# Patient Record
Sex: Female | Born: 2000 | Race: Black or African American | Hispanic: No | Marital: Single | Smoking: Former smoker
Health system: Southern US, Community
[De-identification: ages and names within clinical notes are randomized; demographics above are authoritative.]

## PROBLEM LIST (undated history)

## (undated) ENCOUNTER — Inpatient Hospital Stay (HOSPITAL_COMMUNITY): Payer: Self-pay

## (undated) DIAGNOSIS — N760 Acute vaginitis: Secondary | ICD-10-CM

## (undated) DIAGNOSIS — M329 Systemic lupus erythematosus, unspecified: Secondary | ICD-10-CM

## (undated) DIAGNOSIS — O361990 Maternal care for other isoimmunization, unspecified trimester, not applicable or unspecified: Secondary | ICD-10-CM

## (undated) DIAGNOSIS — I639 Cerebral infarction, unspecified: Secondary | ICD-10-CM

## (undated) DIAGNOSIS — B9689 Other specified bacterial agents as the cause of diseases classified elsewhere: Secondary | ICD-10-CM

## (undated) DIAGNOSIS — O26893 Other specified pregnancy related conditions, third trimester: Principal | ICD-10-CM

## (undated) DIAGNOSIS — R51 Headache: Principal | ICD-10-CM

## (undated) DIAGNOSIS — B373 Candidiasis of vulva and vagina: Secondary | ICD-10-CM

---
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
## 2018-07-03 ENCOUNTER — Other Ambulatory Visit: Payer: Self-pay

## 2018-07-03 ENCOUNTER — Encounter (HOSPITAL_COMMUNITY): Payer: Self-pay

## 2018-07-03 ENCOUNTER — Ambulatory Visit (HOSPITAL_COMMUNITY)
Admission: EM | Admit: 2018-07-03 | Discharge: 2018-07-03 | Disposition: A | Discharge status: Home / Self Care | Payer: Medicaid Other | Attending: Family Medicine | Admitting: Family Medicine

## 2018-07-03 DIAGNOSIS — J029 Acute pharyngitis, unspecified: Secondary | ICD-10-CM | POA: Insufficient documentation

## 2018-07-03 DIAGNOSIS — R509 Fever, unspecified: Secondary | ICD-10-CM

## 2018-07-03 DIAGNOSIS — H9201 Otalgia, right ear: Secondary | ICD-10-CM

## 2018-07-03 LAB — POCT RAPID STREP A: Streptococcus, Group A Screen (Direct): NEGATIVE

## 2018-07-03 MED ORDER — DEXAMETHASONE SODIUM PHOSPHATE 10 MG/ML IJ SOLN
INTRAMUSCULAR | Status: AC
  Filled 2018-07-03: qty 1

## 2018-07-03 MED ORDER — DEXAMETHASONE SODIUM PHOSPHATE 10 MG/ML IJ SOLN
10.0000 mg | Freq: Once | INTRAMUSCULAR | Status: AC
  Administered 2018-07-03: 12:00:00 10 mg via INTRAMUSCULAR

## 2018-07-03 MED ORDER — AMOXICILLIN-POT CLAVULANATE 875-125 MG PO TABS: 1.0000 | ORAL_TABLET | Freq: Two times a day (BID) | ORAL | 0 refills | Status: DC

## 2018-07-03 NOTE — ED Triage Notes (Signed)
Pt presents with sore throat and right ear pain since yesterday.

## 2018-07-03 NOTE — Discharge Instructions (Addendum)
We will go ahead and treat you today for strep based on your symptoms and presentation. Antibiotic sent to the pharmacy for treatment Steroid injection given here in clinic for tonsillar swelling You  can take Tylenol or ibuprofen for pain and fever as needed If your symptoms worsen despite treatment you need to follow-up

## 2018-07-03 NOTE — ED Provider Notes (Signed)
[facility]    CSN: [id number] Arrival date & time: 07/03/18  1131     History   Chief Complaint Chief Complaint  Patient presents with  . Sore Throat  . Otalgia    Right    HPI Jacqueline [name] is a 18 y.o. female.   Patient is a 18 year old female that presents today with sore throat and right ear pain since yesterday.  The pain in her throat is more on the right side.  She is having pain with swallowing.  Symptoms have been constant and worsening.  She has not taken anything for her symptoms.  Denies any associated cough, congestion, body aches, chills, night sweats.  She has low-grade fever here in clinic today.  Unknown if any recent sick contacts.  ROS per HPI      Past Medical History:  Diagnosis Date  . Bacterial vaginitis 07/10/2016  . Candida vaginitis 07/24/2016  . Headache in pregnancy, antepartum, third trimester 07/24/2016  . Kell isoimmunization during pregnancy     Patient Active Problem List   Diagnosis Date Noted  . Nexplanon insertion 08/05/2017  . PPH (postpartum hemorrhage) 08/04/2017  . VBAC (vaginal birth after Cesarean) 08/04/2017  . Abnormal fetal position 07/09/2017  . Limited prenatal care in third trimester 07/09/2017  . Anemia in pregnancy 06/02/2017  . 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 Thrombocytopenia affecting pregnancy, antepartum (HCC) 03/06/2017  . Supervision of other normal pregnancy, antepartum 02/28/2017  . Biological false positive RPR test 02/28/2017  . H/O cesarean section 02/28/2017  . Mild tetrahydrocannabinol (THC) abuse 04/25/2016  . 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 Maternal varicella, non-immune 02/08/2016    Past Surgical History:  Procedure Laterality Date  . CESAREAN SECTION N/A 09/04/2016   Procedure: CESAREAN SECTION;  Surgeon: Levie HeritageStinson, Jacob J, DO;  [facility];  Service: Obstetrics;  Laterality: N/A;  . CESAREAN SECTION Bilateral     OB History    Gravida   2   Para  2   Term  2   Preterm      AB      Living  2     SAB      TAB      Ectopic      Multiple  0   Live Births  2            Home Medications    Prior to Admission medications   Medication Sig Start Date End Date Taking? Authorizing Provider  amoxicillin-clavulanate (AUGMENTIN) 875-125 MG tablet Take 1 tablet by mouth every 12 (twelve) hours. 07/03/18   Janace ArisBast, Tavaris Eudy A, NP  Elastic Bandages & Supports (COMFORT FIT MATERNITY SUPP LG) MISC 1 Units by Does not apply route daily. Patient not taking: Reported on 07/29/2017 06/04/17   Orvilla Cornwallenney, Rachelle A, CNM  ibuprofen (ADVIL,MOTRIN) 600 MG tablet Take 1 tablet (600 mg total) by mouth every 8 (eight) hours. 08/05/17   Degele, Kandra NicolasJulie P, MD    Family History Family History  Problem Relation Age of Onset  . Hypertension Maternal Grandmother   . Diabetes Maternal Grandmother   . Cancer Neg Hx     Social History Social History   Tobacco Use  . Smoking status: Former Smoker    Types: Cigars  . Smokeless tobacco: Never Used  . Tobacco comment: 1 B&M per day  Substance Use Topics  . Alcohol use: No    Alcohol/week: 0.0 standard drinks  . Drug use: No  Allergies   Patient has no known allergies.   Review of Systems Review of Systems   Physical Exam Triage Vital Signs ED Triage Vitals  Enc Vitals Group     BP 07/03/18 1145 104/61     Pulse Rate 07/03/18 1145 94     Resp 07/03/18 1145 18     Temp 07/03/18 1145 99.2 F (37.3 C)     Temp Source 07/03/18 1145 Oral     SpO2 07/03/18 1145 100 %     Weight --      Height --      Head Circumference --      Peak Flow --      Pain Score 07/03/18 1146 9     Pain Loc --      Pain Edu? --      Excl. in [place]? --    No data found.  Updated Vital Signs BP 104/61 (BP Location: Left Arm)   Pulse 94   Temp 99.2 F (37.3 C) (Oral)   Resp 18   LMP 06/30/2018   SpO2 100%   Visual Acuity Right Eye Distance:   Left Eye Distance:   Bilateral Distance:     Right Eye Near:   Left Eye Near:    Bilateral Near:     Physical Exam Vitals signs and nursing note reviewed.  Constitutional:      Appearance: She is well-developed.  HENT:     Head: Normocephalic and atraumatic.     Right Ear: Tympanic membrane and ear canal normal.     Left Ear: Tympanic membrane and ear canal normal.     Mouth/Throat:     Pharynx: Uvula midline. Posterior oropharyngeal erythema present.     Tonsils: Tonsillar exudate present. 3+ on the right. 3+ on the left.  Eyes:     Conjunctiva/sclera: Conjunctivae normal.  Neck:     Musculoskeletal: Normal range of motion.  Pulmonary:     Effort: Pulmonary effort is normal.  Lymphadenopathy:     Cervical: Cervical adenopathy present.  Skin:    General: Skin is warm and dry.  Neurological:     Mental Status: She is alert.  Psychiatric:        Mood and Affect: Mood normal.      UC Treatments / Results  Labs (all labs ordered are listed, but only abnormal results are displayed) Labs Reviewed  CULTURE, GROUP A STREP [facility])  POCT RAPID STREP A    EKG None  Radiology No results found.  Procedures Procedures (including critical care time)  Medications Ordered in UC Medications  dexamethasone (DECADRON) injection 10 mg (10 mg Intramuscular Given 07/03/18 1225)    Initial Impression / Assessment and Plan / UC Course  I have reviewed the triage vital signs and the nursing notes.  Pertinent labs & imaging results that were available during my care of the patient were reviewed by me and considered in my medical decision making (see chart for details).     Sore throat  Patient's rapid strep test was negative here in clinic Based on exam I feel it would be appropriate to go ahead and treat for strep pharyngitis versus tonsillar abscess. Augmentin twice a day for the next 7 days Steroid injection given here in clinic for tonsillar swelling Instructed she to do warm salt water gargles or take Tylenol and  ibuprofen for pain Strict instructions that if her symptoms continue or worsen she will need to go to the hospital. Patient understanding and  agrees Final Clinical Impressions(s) / UC Diagnoses   Final diagnoses:  Sore throat     Discharge Instructions     We will go ahead and treat you today for strep based on your symptoms and presentation. Antibiotic sent to the pharmacy for treatment Steroid injection given here in clinic for tonsillar swelling You  can take Tylenol or ibuprofen for pain and fever as needed If your symptoms worsen despite treatment you need to follow-up    ED Prescriptions    Medication Sig Dispense Auth. Provider   amoxicillin-clavulanate (AUGMENTIN) 875-125 MG tablet Take 1 tablet by mouth every 12 (twelve) hours. 14 tablet Dahlia Byes A, NP     Controlled Substance Prescriptions [place] Controlled Substance Registry consulted? Not Applicable   Janace Aris, NP 07/03/18 1239

## 2018-07-05 LAB — CULTURE, GROUP A STREP (THRC)

## 2019-01-17 IMAGING — US US OB TRANSVAGINAL
1 series · 14 of 28 positions shown · non-contrast
Comparison: None.

CLINICAL DATA: Vaginal bleeding and RIGHT lower quadrant. Beta HCG
[DATE]. Last menstrual period December 04, 2016.

EXAM:
OBSTETRIC <14 WK US AND TRANSVAGINAL OB US
TECHNIQUE: Both transabdominal and transvaginal ultrasound examinations were
performed for complete evaluation of the gestation as well as the
maternal uterus, adnexal regions, and pelvic cul-de-sac.
Transvaginal technique was performed to assess early pregnancy.

[Series 1: us ob transvaginal · 0.20mm/px · 84 acquisitions, 14 frames shown]
[im 4/84]
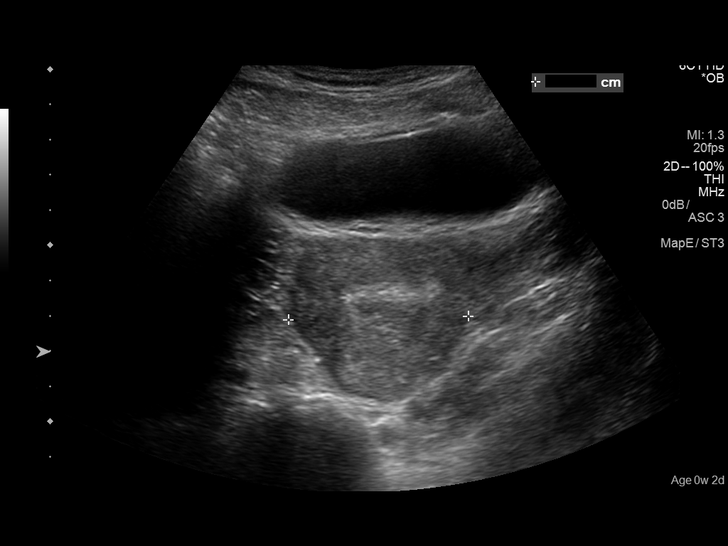
[im 10/84]
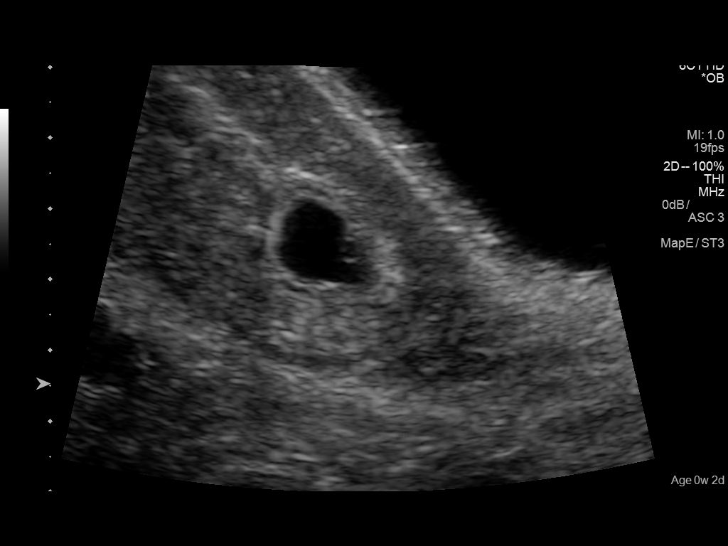
[im 16/84]
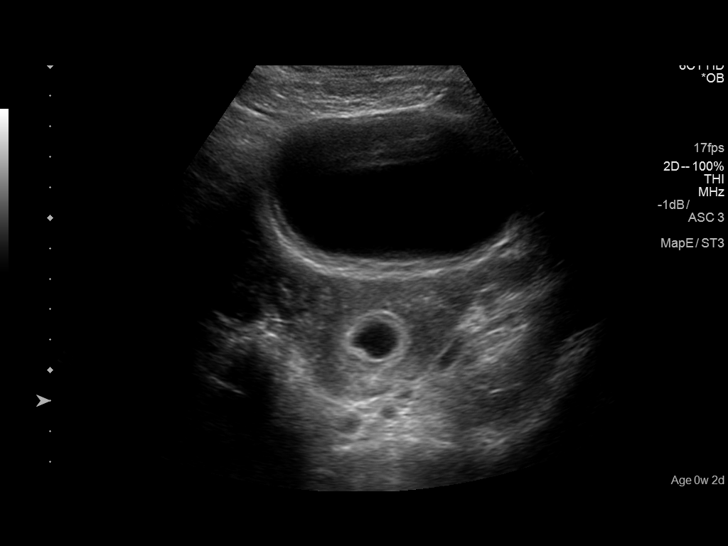
[im 22/84]
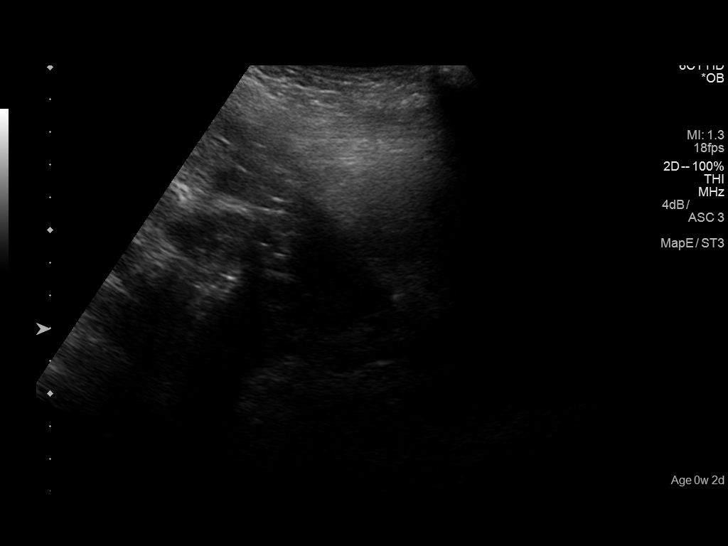
[im 28/84]
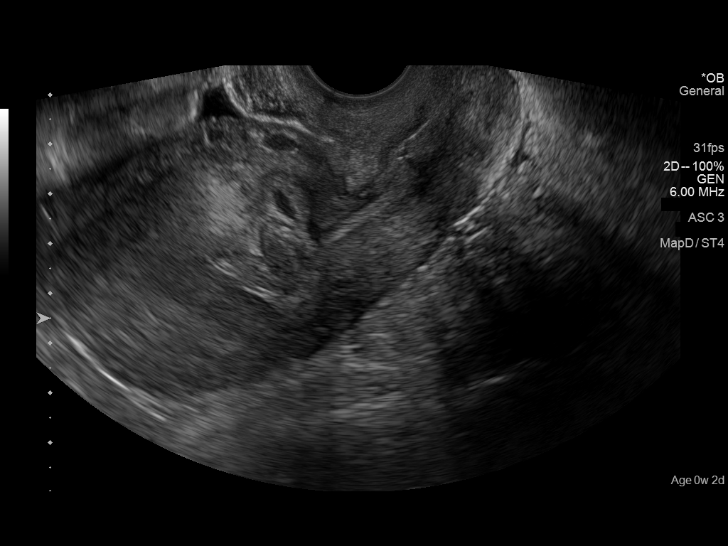
[im 34/84]
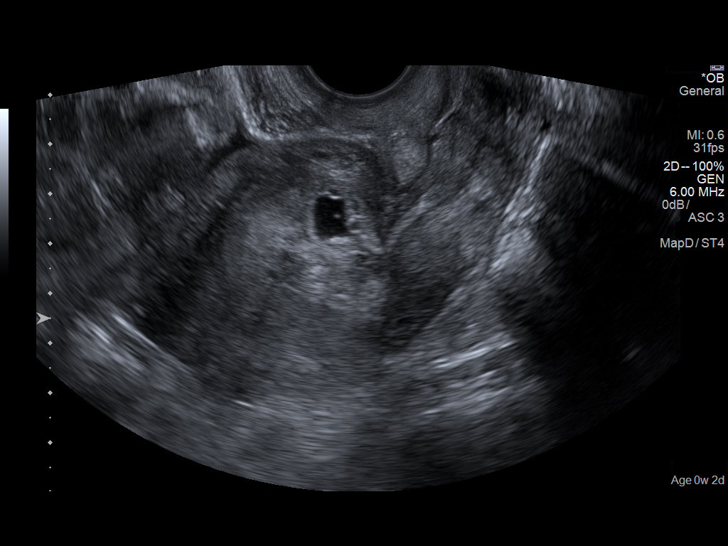
[im 40/84]
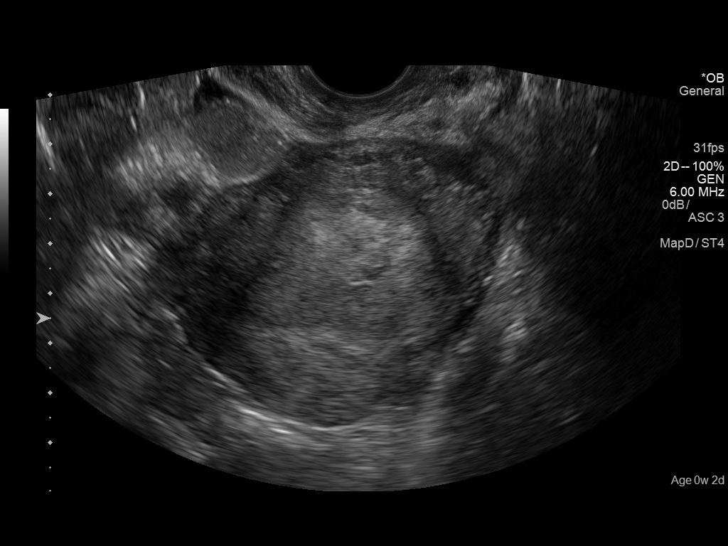
[im 47/84]
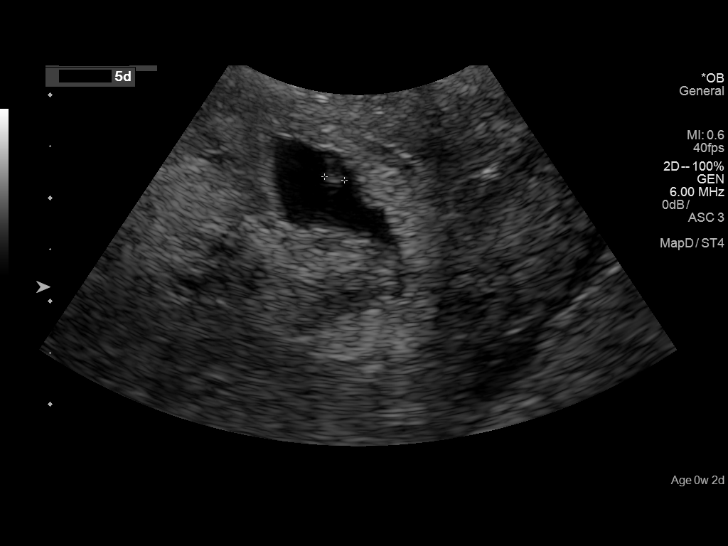
[im 53/84]
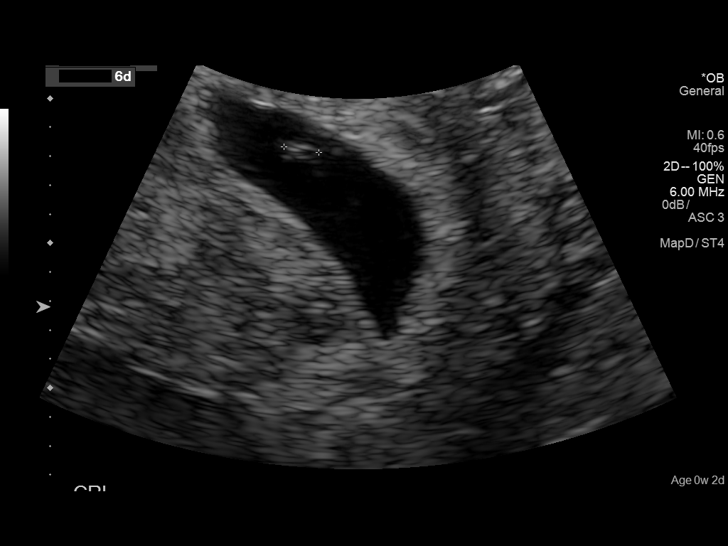
[im 59/84]
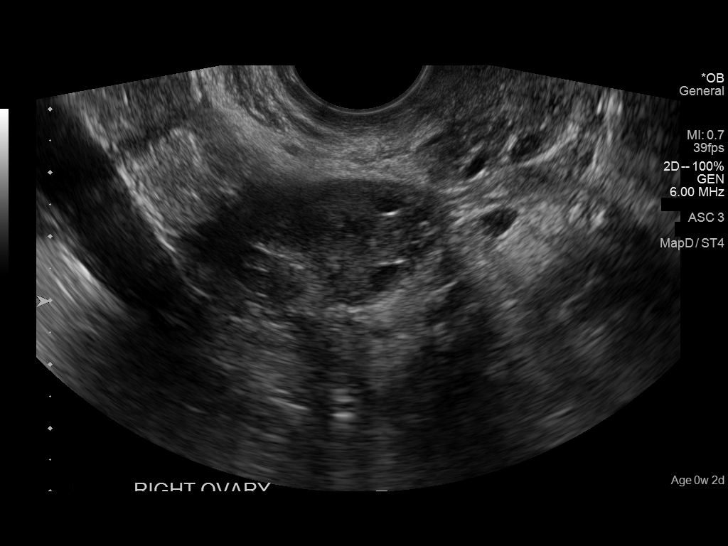
[im 65/84]
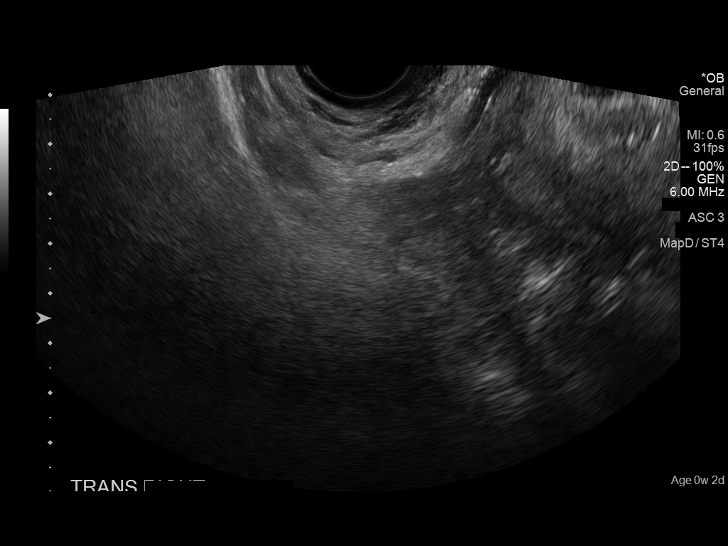
[im 71/84]
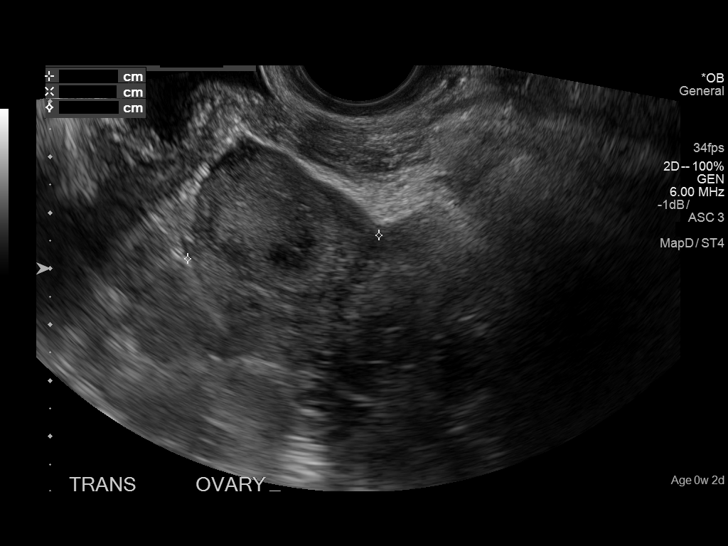
[im 77/84]
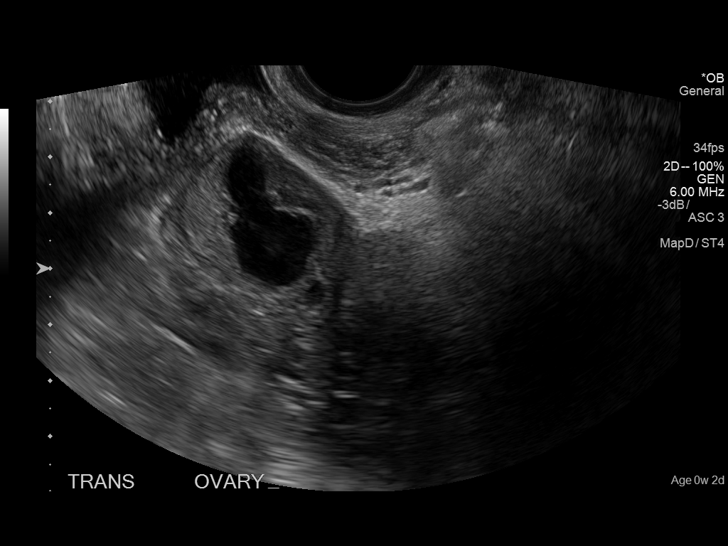
[im 84/84]
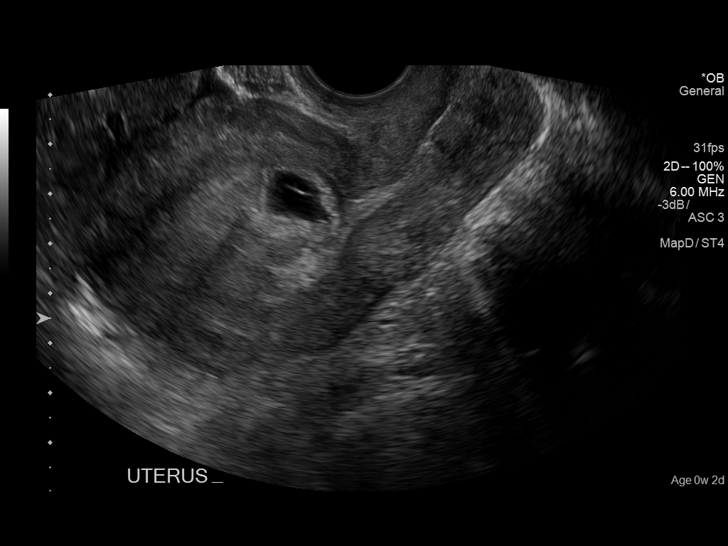

[14 of 28 positions shown; findings below may reference images not displayed]

FINDINGS: Intrauterine gestational sac: Present though within lower uterine
segment scratch and, abnormal teardrop morphology.

Yolk sac:  Present

Embryo:  Present

Cardiac Activity: Present

Heart Rate: 95  bpm

CRL:  2  mm   5 w   5 d                  US EDC: August 03, 2017

Subchorionic hemorrhage:  None visualized.

Maternal uterus/adnexae: 2.9 cm ovarian collapsing LEFT corpus
luteal cyst. No free fluid.
IMPRESSION: 1. Single live intrauterine pregnancy intrauterine pregnancy though,
located within lower uterine segment. Gestational age by ultrasound
5 weeks and 5 days.

## 2020-02-28 ENCOUNTER — Emergency Department (HOSPITAL_COMMUNITY)
Admission: EM | Admit: 2020-02-28 | Discharge: 2020-02-28 | Discharge status: Absent without leave | Payer: Medicaid Other

## 2020-03-14 ENCOUNTER — Emergency Department (HOSPITAL_COMMUNITY)
Admission: EM | Admit: 2020-03-14 | Discharge: 2020-03-14 | Disposition: A | Discharge status: Home / Self Care | Payer: Medicaid Other | Attending: Emergency Medicine | Admitting: Emergency Medicine

## 2020-03-14 ENCOUNTER — Other Ambulatory Visit: Payer: Self-pay

## 2020-03-14 ENCOUNTER — Emergency Department (HOSPITAL_COMMUNITY): Payer: Medicaid Other

## 2020-03-14 DIAGNOSIS — Z5321 Procedure and treatment not carried out due to patient leaving prior to being seen by health care provider: Secondary | ICD-10-CM | POA: Insufficient documentation

## 2020-03-14 DIAGNOSIS — M79644 Pain in right finger(s): Secondary | ICD-10-CM | POA: Diagnosis not present

## 2020-03-14 DIAGNOSIS — M7989 Other specified soft tissue disorders: Secondary | ICD-10-CM | POA: Diagnosis not present

## 2020-03-14 DIAGNOSIS — W231XXA Caught, crushed, jammed, or pinched between stationary objects, initial encounter: Secondary | ICD-10-CM | POA: Diagnosis not present

## 2020-03-14 NOTE — ED Notes (Signed)
Called for vitals x3, no answer. 
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
## 2020-04-29 ENCOUNTER — Other Ambulatory Visit: Payer: Self-pay

## 2020-04-29 ENCOUNTER — Emergency Department (HOSPITAL_COMMUNITY)
Admission: EM | Admit: 2020-04-29 | Discharge: 2020-04-30 | Disposition: A | Discharge status: Home / Self Care | Payer: Medicaid Other | Attending: Emergency Medicine | Admitting: Emergency Medicine

## 2020-04-29 DIAGNOSIS — Z87891 Personal history of nicotine dependence: Secondary | ICD-10-CM | POA: Insufficient documentation

## 2020-04-29 DIAGNOSIS — G43909 Migraine, unspecified, not intractable, without status migrainosus: Secondary | ICD-10-CM | POA: Insufficient documentation

## 2020-04-29 LAB — I-STAT BETA HCG BLOOD, ED (MC, WL, AP ONLY): I-stat hCG, quantitative: <5 m[IU]/mL (ref <5)

## 2020-04-29 MED ORDER — KETOROLAC TROMETHAMINE 30 MG/ML IJ SOLN
30.0000 mg | Freq: Once | INTRAMUSCULAR | Status: AC
  Administered 2020-04-29: 30 mg via INTRAVENOUS
  Filled 2020-04-29: qty 1

## 2020-04-29 MED ORDER — METOCLOPRAMIDE HCL 5 MG/ML IJ SOLN
10.0000 mg | Freq: Once | INTRAMUSCULAR | Status: AC
  Administered 2020-04-29: 10 mg via INTRAVENOUS
  Filled 2020-04-29: qty 2

## 2020-04-29 MED ORDER — SODIUM CHLORIDE 0.9 % IV BOLUS
1000.0000 mL | Freq: Once | INTRAVENOUS | Status: AC
  Administered 2020-04-29: 1000 mL via INTRAVENOUS

## 2020-04-29 NOTE — ED Provider Notes (Signed)
[facility] EMERGENCY DEPARTMENT Provider Note   CSN: [id number] Arrival date & time: 04/29/20  1934     History Chief Complaint  Patient presents with  . Migraine    Jacqueline [name] is a 20 y.o. female.  She is complaining of a migraine headache that started around 3 PM today.  History of same.  Occurred while she was at work.  Associated with photophobia nausea dizziness one episode of vomiting.  Was given Zofran by EMS.  No recent fevers or chills.  No trauma.  Similar headaches in the past but this is more severe.  The history is provided by the patient.  Migraine This is a recurrent problem. The current episode started 6 to 12 hours ago. The problem occurs constantly. The problem has not changed since onset.Associated symptoms include headaches. Pertinent negatives include no chest pain, no abdominal pain and no shortness of breath. Exacerbated by: light and noise. Nothing relieves the symptoms. She has tried rest for the symptoms. The treatment provided no relief.       Past Medical History:  Diagnosis Date  . Bacterial vaginitis 07/10/2016  . Candida vaginitis 07/24/2016  . Headache in pregnancy, antepartum, third trimester 07/24/2016  . Kell isoimmunization during pregnancy     Patient Active Problem List   Diagnosis Date Noted  . Nexplanon insertion 08/05/2017  . PPH (postpartum hemorrhage) 08/04/2017  . VBAC (vaginal birth after Cesarean) 08/04/2017  . Abnormal fetal position 07/09/2017  . Limited prenatal care in third trimester 07/09/2017  . Anemia in pregnancy 06/02/2017  . 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 Thrombocytopenia affecting pregnancy, antepartum (HCC) 03/06/2017  . Supervision of other normal pregnancy, antepartum 02/28/2017  . Biological false positive RPR test 02/28/2017  . H/O cesarean section 02/28/2017  . Mild tetrahydrocannabinol (THC) abuse 04/25/2016  . 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 Maternal  varicella, non-immune 02/08/2016    Past Surgical History:  Procedure Laterality Date  . CESAREAN SECTION N/A 09/04/2016   Procedure: CESAREAN SECTION;  Surgeon: Levie Heritage, DO;  Location: [facility] SU[facility];  Service: Obstetrics;  Laterality: N/A;  . CESAREAN SECTION Bilateral      OB History    Gravida  2   Para  2   Term  2   Preterm      AB      Living  2     SAB      IAB      Ectopic      Multiple  0   Live Births  2           Family History  Problem Relation Age of Onset  . Hypertension Maternal Grandmother   . Diabetes Maternal Grandmother   . Cancer Neg Hx     Social History   Tobacco Use  . Smoking status: Former Smoker    Types: Cigars  . Smokeless tobacco: Never Used  . Tobacco comment: 1 B&M per day  Substance Use Topics  . Alcohol use: No    Alcohol/week: 0.0 standard drinks  . Drug use: No    Home Medications Prior to Admission medications   Medication Sig Start Date End Date Taking? Authorizing Provider  amoxicillin-clavulanate (AUGMENTIN) 875-125 MG tablet Take 1 tablet by mouth every 12 (twelve) hours. 07/03/18   Janace Aris, NP  Elastic Bandages & Supports (COMFORT FIT MATERNITY SUPP LG) MISC 1 Units by Does not apply route daily. Patient not taking: Reported on 07/29/2017 06/04/17  Denney, Rachelle A, CNM  ibuprofen (ADVIL,MOTRIN) 600 MG tablet Take 1 tablet (600 mg total) by mouth every 8 (eight) hours. 08/05/17   Degele, Kandra Nicolas, MD    Allergies    Patient has no known allergies.  Review of Systems   Review of Systems  Constitutional: Negative for fever.  HENT: Negative for sore throat.   Eyes: Positive for photophobia.  Respiratory: Negative for shortness of breath.   Cardiovascular: Negative for chest pain.  Gastrointestinal: Positive for nausea and vomiting. Negative for abdominal pain.  Genitourinary: Negative for dysuria.  Musculoskeletal: Negative for neck pain.  Skin: Negative for rash.  Neurological:  Positive for headaches.    Physical Exam Updated Vital Signs BP 96/67   Pulse 67   Temp 98.3 F (36.8 C) (Oral)   Resp 15   Ht 5\' 4"  (1.626 m)   Wt 70.8 kg   SpO2 96%   BMI 26.78 kg/m   Physical Exam Vitals and nursing note reviewed.  Constitutional:      General: She is not in acute distress.    Appearance: Normal appearance. She is well-developed and well-nourished.  HENT:     Head: Normocephalic and atraumatic.  Eyes:     Conjunctiva/sclera: Conjunctivae normal.  Cardiovascular:     Rate and Rhythm: Normal rate and regular rhythm.     Heart sounds: No murmur heard.   Pulmonary:     Effort: Pulmonary effort is normal. No respiratory distress.     Breath sounds: Normal breath sounds.  Abdominal:     Palpations: Abdomen is soft.     Tenderness: There is no abdominal tenderness.  Musculoskeletal:        General: No edema.     Cervical back: Neck supple.  Skin:    General: Skin is warm and dry.  Neurological:     General: No focal deficit present.     Mental Status: She is alert and oriented to person, place, and time.  Psychiatric:        Mood and Affect: Mood and affect normal.     ED Results / Procedures / Treatments   Labs (all labs ordered are listed, but only abnormal results are displayed) Labs Reviewed  I-STAT BETA HCG BLOOD, ED ([facility], [facility], [facility] ONLY)    EKG None  Radiology No results found.  Procedures Procedures   Medications Ordered in ED Medications  ketorolac (TORADOL) 30 MG/ML injection 30 mg (has no administration in time range)  metoCLOPramide (REGLAN) injection 10 mg (has no administration in time range)    ED Course  I have reviewed the triage vital signs and the nursing notes.  Pertinent labs & imaging results that were available during my care of the patient were reviewed by me and considered in my medical decision making (see chart for details).  Clinical Course as of 04/30/20 1047  Sat Apr 29, 2020  2117 Reassessed patient  she is sleeping. [MB]    Clinical Course User Index [MB] May 01, 2020, MD   MDM Rules/Calculators/A&P                         Patient here with complaint of migraine headache, typical priors.  Nonfocal neuro exam.  Blood pressures little soft and received some IV fluids along with migraine cocktail.  Improvement in her headache.  Differential diagnosis includes migraine, tension headache, dehydration, less likely to be subarachnoid or mass due to prior history of migraines.  Return  instructions discussed  Final Clinical Impression(s) / ED Diagnoses Final diagnoses:  Migraine without status migrainosus, not intractable, unspecified migraine type    Rx / DC Orders ED Discharge Orders    None       Terrilee Files, MD 04/30/20 1048

## 2020-04-29 NOTE — Discharge Instructions (Addendum)
You are seen in the emergency department for a migraine headache. You received some medication and you felt better. Please follow-up with your regular doctor. Drink plenty of fluids and get some rest.

## 2020-04-29 NOTE — ED Triage Notes (Signed)
Pt bib EMS from home due to migraine that started around 1500 today. Pt stated to EMS that this is the worst migraine she has had. Hx of migraines. Complaints of photophobia, dizziness, and nausea. Pt A&Ox4  4mg  of Zofran given en route, 20G left AC placed   Vitals: BP: 130's systolic HR: 80;s CBG: 114 O2: 100% RA

## 2021-03-10 ENCOUNTER — Ambulatory Visit (HOSPITAL_COMMUNITY)
Admission: EM | Admit: 2021-03-10 | Discharge: 2021-03-10 | Disposition: A | Discharge status: Home / Self Care | Payer: Medicaid Other | Attending: Internal Medicine | Admitting: Internal Medicine

## 2021-03-10 ENCOUNTER — Ambulatory Visit (INDEPENDENT_AMBULATORY_CARE_PROVIDER_SITE_OTHER): Payer: Medicaid Other

## 2021-03-10 ENCOUNTER — Encounter (HOSPITAL_COMMUNITY): Payer: Self-pay

## 2021-03-10 ENCOUNTER — Other Ambulatory Visit: Payer: Self-pay

## 2021-03-10 DIAGNOSIS — S63502A Unspecified sprain of left wrist, initial encounter: Secondary | ICD-10-CM

## 2021-03-10 DIAGNOSIS — M25532 Pain in left wrist: Secondary | ICD-10-CM | POA: Diagnosis not present

## 2021-03-10 LAB — POC URINE PREG, ED: Preg Test, Ur: NEGATIVE

## 2021-03-10 MED ORDER — KETOROLAC TROMETHAMINE 60 MG/2ML IM SOLN
INTRAMUSCULAR | Status: AC
  Filled 2021-03-10: qty 2

## 2021-03-10 MED ORDER — KETOROLAC TROMETHAMINE 60 MG/2ML IM SOLN: 60.0000 mg | Freq: Once | INTRAMUSCULAR | Status: DC
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
## 2021-03-10 NOTE — ED Notes (Signed)
Pt called once in lobby with no answer ° °

## 2021-03-10 NOTE — ED Notes (Signed)
Pt declines ketorolac injection.

## 2021-05-08 ENCOUNTER — Other Ambulatory Visit: Payer: Self-pay

## 2021-05-08 ENCOUNTER — Ambulatory Visit (HOSPITAL_COMMUNITY)
Admission: EM | Admit: 2021-05-08 | Discharge: 2021-05-08 | Disposition: A | Discharge status: Home / Self Care | Payer: Medicaid Other | Attending: Physician Assistant | Admitting: Physician Assistant

## 2021-05-08 ENCOUNTER — Encounter (HOSPITAL_COMMUNITY): Payer: Self-pay | Admitting: Emergency Medicine

## 2021-05-08 DIAGNOSIS — N3001 Acute cystitis with hematuria: Secondary | ICD-10-CM | POA: Diagnosis not present

## 2021-05-08 DIAGNOSIS — R7309 Other abnormal glucose: Secondary | ICD-10-CM | POA: Diagnosis not present

## 2021-05-08 DIAGNOSIS — R8290 Unspecified abnormal findings in urine: Secondary | ICD-10-CM | POA: Insufficient documentation

## 2021-05-08 LAB — POCT URINALYSIS DIPSTICK, ED / UC
Bilirubin Urine: NEGATIVE
Glucose, UA: NEGATIVE mg/dL
Ketones, ur: NEGATIVE mg/dL
Nitrite: POSITIVE — AB
Protein, ur: NEGATIVE mg/dL
Specific Gravity, Urine: 1.015 (ref 1.005–1.030)
Urobilinogen, UA: 1 mg/dL (ref 0.0–1.0)
pH: 6 (ref 5.0–8.0)

## 2021-05-08 LAB — POC URINE PREG, ED: Preg Test, Ur: NEGATIVE

## 2021-05-08 LAB — CBG MONITORING, ED: Glucose-Capillary: 90 mg/dL (ref 70–99)

## 2021-05-08 MED ORDER — CEPHALEXIN 500 MG PO CAPS: 500.0000 mg | ORAL_CAPSULE | Freq: Four times a day (QID) | ORAL | 0 refills | Status: DC

## 2021-05-08 NOTE — ED Triage Notes (Addendum)
Patient reports cbg readings at 200.  Ems checked, neighbor checked.  Patient has never been diagnosed as diabetic.  Patient has pins and needles sensation in fingers and toes.   ? ?Patient does not have a pcp.   ?

## 2021-05-08 NOTE — Discharge Instructions (Signed)
Your blood sugar was normal in clinic today.  Your urine also did not have any glucose which is great news.  This makes it less likely that you have diabetes.  I do think you should follow-up closely with a primary care provider to somebody should be getting contact you with how to schedule this.  If you have any concerns please return to our clinic as we discussed. ? ?Your urine looks like you have a urinary tract infection.  This could be contributing to your symptoms.  I have started Keflex 500 mg 4 times daily.  We will contact you if we need to change antibiotics.  Make sure you are drinking plenty of fluid.  If you develop any high fever, chest pain, shortness of breath, nausea, vomiting, back/flank pain you should be seen immediately. ?

## 2021-05-08 NOTE — ED Provider Notes (Signed)
[facility]    CSN: [id number] Arrival date & time: 05/08/21  1854      History   Chief Complaint Chief Complaint  Patient presents with   Hyperglycemia    HPI Jacqueline [name] is a 21 y.o. female.   Patient presents today with a several week history of intermittent symptoms.  She reports experiencing intermittent paresthesias in her distal extremities which prompted her to have her blood sugar checked by a neighbor at which point it was found to be 206.  She did not do anything different and symptoms resolved until she experienced blurred vision earlier today prompting her to call EMS.  Again her blood sugar was checked and this was 212.  She denies personal history of diabetes but does report a strong family history of what she believes is type 2 diabetes.  She denies any recent dietary or medication changes.  She does not drink alcohol.  She does not believe she is pregnant.  She denies any chest pain, shortness of breath, nausea, vomiting, confusion.  She reports that blurred vision has resolved and she is not currently experiencing paresthesias.   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   Thrombocytopenia affecting pregnancy, antepartum (HCC) 03/06/2017   Supervision of other normal pregnancy, antepartum 02/28/2017   Biological false positive RPR test 02/28/2017   H/O cesarean section 02/28/2017   Mild tetrahydrocannabinol (THC) abuse 04/25/2016    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 Authorizing Provider  cephALEXin (KEFLEX) 500 MG capsule Take 1 capsule (500 mg total) by mouth 4 (four) times daily. 05/08/21  Yes Javoris Star, Noberto Retort, PA-C  Elastic Bandages & Supports (COMFORT FIT MATERNITY SUPP LG) MISC 1 Units by Does not apply route daily. Patient not taking: Reported on 07/29/2017 06/04/17   [name], CNM    Family History Family History  Problem Relation Age of Onset   Hypertension Maternal Grandmother    Diabetes Maternal Grandmother    Cancer Neg Hx     Social History Social History   Tobacco Use   Smoking status: Some Days    Types: Cigars   Smokeless tobacco:  Never   Tobacco comments:    1 B&M per day  Vaping Use   Vaping Use: Never used  Substance Use Topics   Alcohol use: No    Alcohol/week: 0.0 standard drinks   Drug use: No     Allergies   Patient has no known allergies.   Review of Systems Review of Systems  Constitutional:  Positive for activity change. Negative for appetite change, fatigue and fever.  Eyes:  Positive for visual disturbance (Intermittent).  Respiratory:  Negative for cough and shortness of breath.   Cardiovascular:  Negative for chest pain.  Gastrointestinal:  Negative for abdominal pain, diarrhea, nausea and vomiting.  Musculoskeletal:  Negative for arthralgias and myalgias.  Neurological:  Negative for  dizziness, facial asymmetry, speech difficulty, weakness, light-headedness, numbness and headaches.    Physical Exam Triage Vital Signs ED Triage Vitals  Enc Vitals Group     BP 05/08/21 1911 102/71     Pulse Rate 05/08/21 1911 89     Resp 05/08/21 1911 18     Temp 05/08/21 1911 98.1 F (36.7 C)     Temp Source 05/08/21 1911 Oral     SpO2 05/08/21 1911 96 %     Weight --      Height --      Head Circumference --      Peak Flow --      Pain Score 05/08/21 1908 6     Pain Loc --      Pain Edu? --      Excl. in GC? --    No data found.  Updated Vital Signs BP 102/71 (BP Location: Left Arm)    Pulse 89    Temp 98.1 F (36.7 C) (Oral)    Resp 18    LMP 05/08/2021 (Approximate)    SpO2 96%   Visual Acuity Right Eye Distance:   Left Eye Distance:   Bilateral Distance:    Right Eye Near:   Left Eye Near:    Bilateral Near:     Physical Exam Vitals reviewed.  Constitutional:      General: She is awake. She is not in acute distress.    Appearance: Normal appearance. She is well-developed. She is not ill-appearing.     Comments: Very pleasant female appears stated age in no acute distress sitting comfortably in exam room  HENT:     Head: Normocephalic and atraumatic.  Eyes:     Extraocular Movements: Extraocular movements intact.     Conjunctiva/sclera: Conjunctivae normal.     Pupils: Pupils are equal, round, and reactive to light.  Cardiovascular:     Rate and Rhythm: Normal rate and regular rhythm.     Heart sounds: Normal heart sounds, S1 normal and S2 normal. No murmur heard. Pulmonary:     Effort: Pulmonary effort is normal.     Breath sounds: Normal breath sounds. No wheezing, rhonchi or rales.     Comments: Clear to auscultation bilaterally Abdominal:     Palpations: Abdomen is soft.     Tenderness: There is no abdominal tenderness.  Psychiatric:        Behavior: Behavior is cooperative.     UC Treatments / Results  Labs (all labs ordered are listed,  but only abnormal results are displayed) Labs Reviewed  POCT URINALYSIS DIPSTICK, ED / UC - Abnormal; Notable for the following components:      Result Value   Hgb urine dipstick MODERATE (*)    Nitrite POSITIVE (*)  Leukocytes,Ua SMALL (*)    All other components within normal limits  URINE CULTURE  POC URINE PREG, ED  CBG MONITORING, ED    EKG   Radiology No results found.  Procedures Procedures (including critical care time)  Medications Ordered in UC Medications - No data to display  Initial Impression / Assessment and Plan / UC Course  I have reviewed the triage vital signs and the nursing notes.  Pertinent labs & imaging results that were available during my care of the patient were reviewed by me and considered in my medical decision making (see chart for details).     Patient's blood sugar was appropriate today at 90.  UA was obtained that showed no glucose urea but did show evidence of infection (see below).  She is currently asymptomatic.  Discussed that this makes it much less likely that she is diabetic but she would likely benefit from seeing PCP for additional evaluation and testing.  She does not currently have a PCP so we will try to establish her with 1 via PCP assistance.  Discussed that if she has any recurrent or persistent symptoms she should return for reevaluation at which point we consider additional lab work.  Strict return precautions given to which she expressed understanding.  UA obtained today showed evidence of infection including positive nitrates.  Discussed that this could be making her feel poorly and contribute to symptoms.  We will treat with Keflex 500 mg 4 times daily.  Urine culture was obtained and we discussed potential need to change antibiotics based on susceptibilities identified on culture.  She is to drink plenty of fluids.  Discussed that if she has persistent or worsening symptoms including high fever, chest pain, shortness of  breath, abdominal pain, nausea/vomiting she needs to be seen immediately.  Strict return precautions given to which she expressed understanding.  Work excuse note provided.  Final Clinical Impressions(s) / UC Diagnoses   Final diagnoses:  Abnormal blood sugar  Abnormal urinalysis     Discharge Instructions      Your blood sugar was normal in clinic today.  Your urine also did not have any glucose which is great news.  This makes it less likely that you have diabetes.  I do think you should follow-up closely with a primary care provider to somebody should be getting contact you with how to schedule this.  If you have any concerns please return to our clinic as we discussed.  Your urine looks like you have a urinary tract infection.  This could be contributing to your symptoms.  I have started Keflex 500 mg 4 times daily.  We will contact you if we need to change antibiotics.  Make sure you are drinking plenty of fluid.  If you develop any high fever, chest pain, shortness of breath, nausea, vomiting, back/flank pain you should be seen immediately.     ED Prescriptions     Medication Sig Dispense Auth. Provider   cephALEXin (KEFLEX) 500 MG capsule Take 1 capsule (500 mg total) by mouth 4 (four) times daily. 20 capsule Rasheema Truluck, Noberto Retort, PA-C      PDMP not reviewed this encounter.   Jeani Hawking, PA-C 05/08/21 2006

## 2021-05-09 ENCOUNTER — Encounter (HOSPITAL_COMMUNITY): Payer: Self-pay

## 2021-05-10 LAB — URINE CULTURE

## 2021-05-22 ENCOUNTER — Ambulatory Visit (HOSPITAL_COMMUNITY)
Admission: EM | Admit: 2021-05-22 | Discharge: 2021-05-22 | Disposition: A | Discharge status: Home / Self Care | Payer: Medicaid Other | Attending: Student | Admitting: Student

## 2021-05-22 ENCOUNTER — Encounter (HOSPITAL_COMMUNITY): Payer: Self-pay | Admitting: Emergency Medicine

## 2021-05-22 ENCOUNTER — Ambulatory Visit: Payer: Self-pay | Admitting: *Deleted

## 2021-05-22 ENCOUNTER — Other Ambulatory Visit: Payer: Self-pay

## 2021-05-22 ENCOUNTER — Ambulatory Visit (INDEPENDENT_AMBULATORY_CARE_PROVIDER_SITE_OTHER): Payer: Medicaid Other

## 2021-05-22 DIAGNOSIS — B349 Viral infection, unspecified: Secondary | ICD-10-CM

## 2021-05-22 DIAGNOSIS — R042 Hemoptysis: Secondary | ICD-10-CM

## 2021-05-22 DIAGNOSIS — R079 Chest pain, unspecified: Secondary | ICD-10-CM

## 2021-05-22 MED ORDER — PREDNISONE 20 MG PO TABS: 40.0000 mg | ORAL_TABLET | Freq: Every day | ORAL | 0 refills | Status: AC

## 2021-05-22 MED ORDER — ALBUTEROL SULFATE HFA 108 (90 BASE) MCG/ACT IN AERS: 1.0000 | INHALATION_SPRAY | Freq: Four times a day (QID) | RESPIRATORY_TRACT | 0 refills | Status: DC | PRN

## 2021-05-22 MED ORDER — PROMETHAZINE-DM 6.25-15 MG/5ML PO SYRP: 5.0000 mL | ORAL_SOLUTION | Freq: Four times a day (QID) | ORAL | 0 refills | Status: DC | PRN

## 2021-05-22 NOTE — ED Provider Notes (Signed)
?[facility] ? ? ? ?CSN: [id number] ?Arrival date & time: 05/22/21  1742 ? ? ?  ? ?History   ?Chief Complaint ?Chief Complaint  ?Patient presents with  ? Cough  ? Chest Pain  ? ? ?HPI ?Jacqueline [name] is a 21 y.o. female presenting with cough for 4 days.  History of tobacco abuse no formal diagnosis pulm ds.  Describes hacking cough productive of clear sputum that is occasionally tinged with bright red blood for the last day.  Also with some pleuritic chest pain with deep inspiration, but denies chest pain with movement.  This is primarily on the right side below her breast.  Denies shortness of breath, fever/chills, weakness, myalgias, nausea/vomiting/diarrhea.  Denies known sick contacts. Denies epistaxis, PND, facial pain, ear pain. ? ?HPI ? ?Past Medical History:  ?Diagnosis Date  ? Bacterial vaginitis 07/10/2016  ? Candida vaginitis 07/24/2016  ? Headache in pregnancy, antepartum, third trimester 07/24/2016  ? Kell isoimmunization during pregnancy   ? ? ?Patient Active Problem List  ? Diagnosis Date Noted  ? Nexplanon insertion 08/05/2017  ? PPH (postpartum hemorrhage) 08/04/2017  ? VBAC (vaginal birth after Cesarean) 08/04/2017  ? Abnormal fetal position 07/09/2017  ? Limited prenatal care in third trimester 07/09/2017  ? Anemia in pregnancy 06/02/2017  ? 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 Thrombocytopenia affecting pregnancy, antepartum (HCC) 03/06/2017  ? Supervision of other normal pregnancy, antepartum 02/28/2017  ? Biological false positive RPR test 02/28/2017  ? H/O cesarean section 02/28/2017  ? Mild tetrahydrocannabinol (THC) abuse 04/25/2016  ? 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 Maternal varicella, non-immune 02/08/2016  ? ? ?Past Surgical History:  ?Procedure Laterality Date  ? CESAREAN SECTION N/A 09/04/2016  ? Procedure: CESAREAN SECTION;  Surgeon: Levie Heritage, DO;  Location: [facility];  Service: Obstetrics;  Laterality: N/A;  ? CESAREAN SECTION Bilateral    ? ? ?OB History   ? ? Gravida  ?2  ? Para  ?2  ? Term  ?2  ? Preterm  ?   ? AB  ?   ? Living  ?2  ?  ? ? SAB  ?   ? IAB  ?   ? Ectopic  ?   ? Multiple  ?0  ? Live Births  ?2  ?   ?  ?  ? ? ? ?Home Medications   ? ?Prior to Admission medications   ?Medication Sig Start Date End Date Taking? Authorizing Provider  ?albuterol (VENTOLIN HFA) 108 (90 Base) MCG/ACT inhaler Inhale 1-2 puffs into the lungs every 6 (six) hours as needed for wheezing or shortness of breath. 05/22/21  Yes Rhys Martini, PA-C  ?predniSONE (DELTASONE) 20 MG tablet Take 2 tablets (40 mg total) by mouth daily for 5 days. Take with breakfast or lunch. Avoid NSAIDs (ibuprofen, etc) while taking this medication. 05/22/21 05/27/21 Yes Rhys Martini, PA-C  ?promethazine-dextromethorphan (PROMETHAZINE-DM) 6.25-15 MG/5ML syrup Take 5 mLs by mouth 4 (four) times daily as needed for cough. 05/22/21  Yes Rhys Martini, PA-C  ? ? ?Family History ?Family History  ?Problem Relation Age of Onset  ? Hypertension Maternal Grandmother   ? Diabetes Maternal Grandmother   ? Cancer Neg Hx   ? ? ?Social History ?Social History  ? ?Tobacco Use  ? Smoking status: Some Days  ?  Types: Cigars  ? Smokeless tobacco: Never  ? Tobacco comments:  ?  1 B&M per day  ?Vaping Use  ? Vaping Use:  Never used  ?Substance Use Topics  ? Alcohol use: No  ?  Alcohol/week: 0.0 standard drinks  ? Drug use: No  ? ? ? ?Allergies   ?Patient has no known allergies. ? ? ?Review of Systems ?Review of Systems  ?Constitutional:  Negative for appetite change, chills and fever.  ?HENT:  Negative for congestion, ear pain, rhinorrhea, sinus pressure, sinus pain and sore throat.   ?Eyes:  Negative for redness and visual disturbance.  ?Respiratory:  Positive for cough. Negative for chest tightness, shortness of breath and wheezing.   ?Cardiovascular:  Negative for chest pain and palpitations.  ?Gastrointestinal:  Negative for abdominal pain, constipation, diarrhea, nausea and vomiting.  ?Genitourinary:   Negative for dysuria, frequency and urgency.  ?Musculoskeletal:  Negative for myalgias.  ?Neurological:  Negative for dizziness, weakness and headaches.  ?Psychiatric/Behavioral:  Negative for confusion.   ?All other systems reviewed and are negative. ? ? ?Physical Exam ?Triage Vital Signs ?ED Triage Vitals  ?Enc Vitals Group  ?   BP 05/22/21 1835 102/66  ?   Pulse Rate 05/22/21 1835 85  ?   Resp 05/22/21 1835 15  ?   Temp 05/22/21 1835 98.4 ?F (36.9 ?C)  ?   Temp Source 05/22/21 1835 Oral  ?   SpO2 05/22/21 1835 97 %  ?   Weight --   ?   Height --   ?   Head Circumference --   ?   Peak Flow --   ?   Pain Score 05/22/21 1834 5  ?   Pain Loc --   ?   Pain Edu? --   ?   Excl. in GC? --   ? ?No data found. ? ?Updated Vital Signs ?BP 102/66 (BP Location: Right Arm)   Pulse 85   Temp 98.4 ?F (36.9 ?C) (Oral)   Resp 15   LMP 05/08/2021 (Approximate)   SpO2 97%  ? ?Visual Acuity ?Right Eye Distance:   ?Left Eye Distance:   ?Bilateral Distance:   ? ?Right Eye Near:   ?Left Eye Near:    ?Bilateral Near:    ? ?Physical Exam ?Vitals reviewed.  ?Constitutional:   ?   General: She is not in acute distress. ?   Appearance: Normal appearance. She is not ill-appearing.  ?HENT:  ?   Head: Normocephalic and atraumatic.  ?   Right Ear: Tympanic membrane, ear canal and external ear normal. No tenderness. No middle ear effusion. There is no impacted cerumen. Tympanic membrane is not perforated, erythematous, retracted or bulging.  ?   Left Ear: Tympanic membrane, ear canal and external ear normal. No tenderness.  No middle ear effusion. There is no impacted cerumen. Tympanic membrane is not perforated, erythematous, retracted or bulging.  ?   Nose: Nose normal. No congestion.  ?   Mouth/Throat:  ?   Mouth: Mucous membranes are moist.  ?   Pharynx: Uvula midline. No oropharyngeal exudate or posterior oropharyngeal erythema.  ?Eyes:  ?   Extraocular Movements: Extraocular movements intact.  ?   Pupils: Pupils are equal, round, and  reactive to light.  ?Cardiovascular:  ?   Rate and Rhythm: Normal rate and regular rhythm.  ?   Heart sounds: Normal heart sounds.  ?Pulmonary:  ?   Effort: Pulmonary effort is normal.  ?   Breath sounds: Normal breath sounds. No decreased breath sounds, wheezing, rhonchi or rales.  ?Chest:  ?   Chest wall: No tenderness.  ?   Comments: Pain  is not reproducible ?Abdominal:  ?   Palpations: Abdomen is soft.  ?   Tenderness: There is no abdominal tenderness. There is no guarding or rebound.  ?Lymphadenopathy:  ?   Cervical: No cervical adenopathy.  ?   Right cervical: No superficial cervical adenopathy. ?   Left cervical: No superficial cervical adenopathy.  ?Neurological:  ?   General: No focal deficit present.  ?   Mental Status: She is alert and oriented to person, place, and time.  ?Psychiatric:     ?   Mood and Affect: Mood normal.     ?   Behavior: Behavior normal.     ?   Thought Content: Thought content normal.     ?   Judgment: Judgment normal.  ? ? ? ?UC Treatments / Results  ?Labs ?(all labs ordered are listed, but only abnormal results are displayed) ?Labs Reviewed - No data to display ? ?EKG ? ? ?Radiology ?DG Chest 2 View ? ?Result Date: 05/22/2021 ?CLINICAL DATA:  Hemoptysis and chest pain. EXAM: CHEST - 2 VIEW COMPARISON:  Chest x-ray 11/13/2015. FINDINGS: The heart size and mediastinal contours are within normal limits. Both lungs are clear. The visualized skeletal structures are unremarkable. IMPRESSION: No active cardiopulmonary disease. Electronically Signed   By: Darliss CheneyAmy  Guttmann M.D.   On: 05/22/2021 19:13   ? ?Procedures ?Procedures (including critical care time) ? ?Medications Ordered in UC ?Medications - No data to display ? ?Initial Impression / Assessment and Plan / UC Course  ?I have reviewed the triage vital signs and the nursing notes. ? ?Pertinent labs & imaging results that were available during my care of the patient were reviewed by me and considered in my medical decision making (see  chart for details). ? ?  ? ?This patient is a very pleasant 21 y.o. year old female presenting with viral URI with cough. Afebrile, nontachy, nontoxic appearing, no adventitious breath sounds. She notes trace bl

## 2021-05-22 NOTE — ED Triage Notes (Signed)
Pt had cough that had cold then yesterday noticed blood when coughed. C/o chest pains that are worse when taking deep breaths.  ?

## 2021-05-22 NOTE — Discharge Instructions (Addendum)
-  Prednisone, 2 pills taken at the same time for 5 days in a row.  Try taking this earlier in the day as it can give you energy. Avoid NSAIDs like ibuprofen and alleve while taking this medication as they can increase your risk of stomach upset and even GI bleeding when in combination with a steroid. You can continue tylenol (acetaminophen) up to 1000mg  3x daily. ?-Albuterol inhaler as needed for cough, wheezing, shortness of breath, 1 to 2 puffs every 6 hours as needed. ?-Promethazine DM cough syrup for congestion/cough. This could make you drowsy, so take at night before bed. ?-With a virus, you're typically contagious for 5-7 days, or as long as you're having fevers.  ?-Follow-up if symptoms getting worse instead of better, like chest pain at rest, you are coughing up more blood, you become short of breath, you develop high fevers, etc. ?

## 2021-05-22 NOTE — Telephone Encounter (Signed)
?  Chief Complaint: Chest pain ?Symptoms: Right sided chest pain,constant, worsens with deep breathing, radiates to neck,coughing up scant amounts bright red blood. ?Frequency: 3-4 days ?Pertinent Negatives: Patient denies  ?Disposition: [x] ED /[] Urgent Care (no appt availability in office) / [] Appointment(In office/virtual)/ []  [facility]/ [] Home Care/ [] Refused Recommended Disposition /[] [facility]/ []  Follow-up with PCP ?Additional Notes: States will follow disposition.  No PCP  ? ? ?Reason for Disposition ? Taking a deep breath makes pain worse ? ?Answer Assessment - Initial Assessment Questions ?1. LOCATION: "Where does it hurt?"   ?    Right sided, up to neck ?2. RADIATION: "Does the pain go anywhere else?" (e.g., into neck, jaw, arms, back) ?    Neck ?3. ONSET: "When did the chest pain begin?" (Minutes, hours or days)  ?    3-4 days ?4. PATTERN "Does the pain come and go, or has it been constant since it started?"  "Does it get worse with exertion?"  ?    constant ?5. DURATION: "How long does it last" (e.g., seconds, minutes, hours) ?    constant ?6. SEVERITY: "How bad is the pain?"  (e.g., Scale 1-10; mild, moderate, or severe) ?   - MILD (1-3): doesn't interfere with normal activities  ?   - MODERATE (4-7): interferes with normal activities or awakens from sleep ?   - SEVERE (8-10): excruciating pain, unable to do any normal activities   ?    5/10 ?7. CARDIAC RISK FACTORS: "Do you have any history of heart problems or risk factors for heart disease?" (e.g., angina, prior heart attack; diabetes, high blood pressure, high cholesterol, smoker, or strong family history of heart disease) ?     ?8. PULMONARY RISK FACTORS: "Do you have any history of lung disease?"  (e.g., blood clots in lung, asthma, emphysema, birth control pills) ?     ?9. CAUSE: "What do you think is causing the chest pain?" ?    unsure ?10. OTHER SYMPTOMS: "Do you have any other symptoms?" (e.g., dizziness, nausea,  vomiting, sweating, fever, difficulty breathing, cough) ?      Coughing up blood, bright red, little. No cough ?11. PREGNANCY: "Is there any chance you are pregnant?" "When was your last menstrual period?" ? ?Protocols used: Chest Pain-A-AH ? ?

## 2021-11-03 ENCOUNTER — Emergency Department (HOSPITAL_COMMUNITY)
Admission: EM | Admit: 2021-11-03 | Discharge: 2021-11-03 | Disposition: A | Discharge status: Home / Self Care | Payer: Medicaid Other | Attending: Emergency Medicine | Admitting: Emergency Medicine

## 2021-11-03 ENCOUNTER — Emergency Department (HOSPITAL_COMMUNITY): Payer: Medicaid Other

## 2021-11-03 ENCOUNTER — Other Ambulatory Visit: Payer: Self-pay

## 2021-11-03 ENCOUNTER — Encounter (HOSPITAL_COMMUNITY): Payer: Self-pay | Admitting: Emergency Medicine

## 2021-11-03 DIAGNOSIS — M549 Dorsalgia, unspecified: Secondary | ICD-10-CM | POA: Insufficient documentation

## 2021-11-03 DIAGNOSIS — S7292XA Unspecified fracture of left femur, initial encounter for closed fracture: Secondary | ICD-10-CM

## 2021-11-03 DIAGNOSIS — Z20822 Contact with and (suspected) exposure to covid-19: Secondary | ICD-10-CM | POA: Insufficient documentation

## 2021-11-03 DIAGNOSIS — S80212A Abrasion, left knee, initial encounter: Secondary | ICD-10-CM | POA: Diagnosis not present

## 2021-11-03 LAB — RESP PANEL BY RT-PCR (FLU A&B, COVID) ARPGX2
Influenza A by PCR: NEGATIVE
Influenza B by PCR: NEGATIVE
SARS Coronavirus 2 by RT PCR: NEGATIVE

## 2021-11-03 LAB — I-STAT BETA HCG BLOOD, ED (MC, WL, AP ONLY): I-stat hCG, quantitative: <5 m[IU]/mL (ref <5)

## 2021-11-03 LAB — CBC
HCT: 29.8 % — ABNORMAL LOW (ref 36.0–46.0)
Hemoglobin: 10.2 g/dL — ABNORMAL LOW (ref 12.0–15.0)
MCH: 24.8 pg — ABNORMAL LOW (ref 26.0–34.0)
MCHC: 34.2 g/dL (ref 30.0–36.0)
MCV: 72.5 fL — ABNORMAL LOW (ref 80.0–100.0)
Platelets: 53 10*3/uL — ABNORMAL LOW (ref 150–400)
RBC: 4.11 MIL/uL (ref 3.87–5.11)
RDW: 19.5 % — ABNORMAL HIGH (ref 11.5–15.5)
WBC: 5.5 10*3/uL (ref 4.0–10.5)
nRBC: 0 % (ref 0.0–0.2)

## 2021-11-03 LAB — LACTIC ACID, PLASMA: Lactic Acid, Venous: 0.9 mmol/L (ref 0.5–1.9)

## 2021-11-03 LAB — I-STAT CHEM 8, ED
BUN: 4 mg/dL — ABNORMAL LOW (ref 6–20)
Calcium, Ion: 1.14 mmol/L — ABNORMAL LOW (ref 1.15–1.40)
Chloride: 108 mmol/L (ref 98–111)
Creatinine, Ser: 0.5 mg/dL (ref 0.44–1.00)
Glucose, Bld: 84 mg/dL (ref 70–99)
HCT: 29 % — ABNORMAL LOW (ref 36.0–46.0)
Hemoglobin: 9.9 g/dL — ABNORMAL LOW (ref 12.0–15.0)
Potassium: 3.4 mmol/L — ABNORMAL LOW (ref 3.5–5.1)
Sodium: 140 mmol/L (ref 135–145)
TCO2: 21 mmol/L — ABNORMAL LOW (ref 22–32)

## 2021-11-03 LAB — PROTIME-INR
INR: 1 (ref 0.8–1.2)
Prothrombin Time: 12.7 seconds (ref 11.4–15.2)

## 2021-11-03 LAB — COMPREHENSIVE METABOLIC PANEL
ALT: 13 U/L (ref 0–44)
AST: 24 U/L (ref 15–41)
Albumin: 3.4 g/dL — ABNORMAL LOW (ref 3.5–5.0)
Alkaline Phosphatase: 60 U/L (ref 38–126)
Anion gap: 9 (ref 5–15)
BUN: 5 mg/dL — ABNORMAL LOW (ref 6–20)
CO2: 20 mmol/L — ABNORMAL LOW (ref 22–32)
Calcium: 8.6 mg/dL — ABNORMAL LOW (ref 8.9–10.3)
Chloride: 110 mmol/L (ref 98–111)
Creatinine, Ser: 0.67 mg/dL (ref 0.44–1.00)
GFR, Estimated: >60 mL/min (ref >60)
Glucose, Bld: 85 mg/dL (ref 70–99)
Potassium: 3.6 mmol/L (ref 3.5–5.1)
Sodium: 139 mmol/L (ref 135–145)
Total Bilirubin: 0.6 mg/dL (ref 0.3–1.2)
Total Protein: 6.5 g/dL (ref 6.5–8.1)

## 2021-11-03 LAB — SAMPLE TO BLOOD BANK

## 2021-11-03 LAB — ETHANOL: Alcohol, Ethyl (B): <10 mg/dL (ref <10)

## 2021-11-03 MED ORDER — IOHEXOL 300 MG/ML  SOLN
100.0000 mL | Freq: Once | INTRAMUSCULAR | Status: AC | PRN
  Administered 2021-11-03: 100 mL via INTRAVENOUS

## 2021-11-03 MED ORDER — ONDANSETRON HCL 4 MG/2ML IJ SOLN
4.0000 mg | Freq: Once | INTRAMUSCULAR | Status: AC
  Administered 2021-11-03: 4 mg via INTRAVENOUS
  Filled 2021-11-03: qty 2

## 2021-11-03 MED ORDER — OXYCODONE HCL 5 MG PO TABS
5.0000 mg | ORAL_TABLET | Freq: Once | ORAL | Status: AC
  Administered 2021-11-03: 5 mg via ORAL
  Filled 2021-11-03: qty 1

## 2021-11-03 MED ORDER — SODIUM CHLORIDE 0.9 % IV BOLUS
1000.0000 mL | Freq: Once | INTRAVENOUS | Status: AC
  Administered 2021-11-03: 1000 mL via INTRAVENOUS

## 2021-11-03 MED ORDER — MORPHINE SULFATE (PF) 4 MG/ML IV SOLN
4.0000 mg | Freq: Once | INTRAVENOUS | Status: AC
  Administered 2021-11-03: 4 mg via INTRAVENOUS
  Filled 2021-11-03: qty 1

## 2021-11-03 MED ORDER — OXYCODONE HCL 5 MG PO TABS: 5.0000 mg | ORAL_TABLET | ORAL | 0 refills | Status: DC | PRN

## 2021-11-03 NOTE — ED Notes (Signed)
This RN and PA witnessed pt take her shirt off and change into hospital gown with no assistance. Pt did not mention any pain or discomfort while doing so.

## 2021-11-03 NOTE — Progress Notes (Signed)
Orthopedic Tech Progress Note Patient Details:  Jacqueline [name] 13-Jun-2000 [id number]  Ortho Devices Type of Ortho Device: Knee Immobilizer, Crutches Ortho Device/Splint Location: lle Ortho Device/Splint Interventions: Ordered, Application, Adjustment   Post Interventions Patient Tolerated: Well Instructions Provided: Care of device, Adjustment of device  [name] Post 11/03/2021, 7:49 PM

## 2021-11-03 NOTE — Discharge Instructions (Addendum)
You were seen here for evaluation of your leg pain. You have a fracture in your leg. You will need to leave your leg in the immobilizer and use crutches. Do not put any weight on that leg. You can take 600mg  of ibuprofen and 1000mg  of Tylenol every 6 hours as needed for pain. I am sending you in a few narcotic pain pills to take for breakthrough pain.  Do not or drive while this medication as it can make you sleepy.  I have included the information for the orthopedic surgeon and you will need to follow-up with later this week as this may need surgical intervention. Please call to schedule an appointment. If you have any numbness, tingling, color changes, worsening pain, please return the nearest emergency department for evaluation.  Contact a health care provider if: You have more knee pain and swelling. You have trouble walking. Your cast becomes wet or damaged or suddenly feels too tight. You have fluid, blood, or pus coming from under your cast. You develop a fever. Get help right away if: Your skin or toenails turn blue or gray, feel cold, or become numb. You have severe pain below the fracture. You have new swelling or redness in your lower leg.

## 2021-11-03 NOTE — ED Notes (Signed)
NT tried to encourage patient to go to the bathroom for a urine sample, patient stated that she is not able to go

## 2021-11-03 NOTE — ED Provider Notes (Signed)
  Physical Exam  BP 106/64 (BP Location: Left Arm)   Pulse 80   Temp 99.4 F (37.4 C) (Oral)   Resp (!) 21   Ht 5\' 3"  (1.6 m)   Wt 72.6 kg   LMP 10/28/2021   SpO2 100%   BMI 28.34 kg/m   Physical Exam Vitals and nursing note reviewed.  Constitutional:      General: She is not in acute distress.    Appearance: She is not ill-appearing or toxic-appearing.  HENT:     Head: Normocephalic and atraumatic.     Comments: No raccoon eyes or Battle signs. Exam limited secondary to patient's hair wrap.  Neck:     Comments: Full ROM without pain. No midline or paraspinal tenderness to palpation. No step offs or deformities. No signs of trauma.  Cardiovascular:     Rate and Rhythm: Normal rate.  Pulmonary:     Effort: Pulmonary effort is normal. No respiratory distress.     Breath sounds: Normal breath sounds.  Abdominal:     General: Bowel sounds are normal. There is no distension.     Palpations: Abdomen is soft.     Tenderness: There is no abdominal tenderness. There is no guarding or rebound.  Musculoskeletal:        General: Tenderness and signs of injury present.     Cervical back: Normal range of motion. No rigidity.     Comments: Multiple abrasions noted to the left knee, more on the medial side. Compartments are soft. Sensation intact. Pulses are intact. Patient cannot bend knee d/t pain. Can wiggle toes and ankle with pain referring up to knee. No tenderness to the hips or pelvis. Some paraspinal   Lymphadenopathy:     Cervical: No cervical adenopathy.  Neurological:     Mental Status: She is alert.     Procedures  Procedures  ED Course / MDM   Clinical Course as of 11/03/21 1748  Sat Nov 03, 2021  1459 Re-evaluated and noted improvement of her symptoms. [SB]    Clinical Course User Index [SB] Blue, Soijett A, PA-C   Medical Decision Making Amount and/or Complexity of Data Reviewed Labs: ordered. Radiology: ordered.  Risk Prescription drug management.       Consulted ortho and

## 2021-11-03 NOTE — ED Notes (Signed)
Discharge instructions reviewed with patient. Patient verbalized understanding of instructions. Follow-up care and medications were reviewed. Patient ambulatory with steady gait. VSS upon discharge.  ?
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
## 2021-11-03 NOTE — ED Notes (Signed)
Urine sample not done, clicked off for wrong patient

## 2021-11-03 NOTE — ED Notes (Signed)
Patient transported to x-ray. ?

## 2022-04-07 ENCOUNTER — Inpatient Hospital Stay (HOSPITAL_COMMUNITY)
Admission: EM | Admit: 2022-04-07 | Discharge: 2022-04-14 | DRG: 175 | Disposition: A | Discharge status: Home / Self Care | Payer: Medicaid Other | Attending: Family Medicine | Admitting: Family Medicine

## 2022-04-07 ENCOUNTER — Other Ambulatory Visit: Payer: Self-pay

## 2022-04-07 DIAGNOSIS — Z8249 Family history of ischemic heart disease and other diseases of the circulatory system: Secondary | ICD-10-CM

## 2022-04-07 DIAGNOSIS — D519 Vitamin B12 deficiency anemia, unspecified: Secondary | ICD-10-CM | POA: Diagnosis present

## 2022-04-07 DIAGNOSIS — D529 Folate deficiency anemia, unspecified: Secondary | ICD-10-CM | POA: Diagnosis present

## 2022-04-07 DIAGNOSIS — F17210 Nicotine dependence, cigarettes, uncomplicated: Secondary | ICD-10-CM | POA: Diagnosis present

## 2022-04-07 DIAGNOSIS — R791 Abnormal coagulation profile: Secondary | ICD-10-CM | POA: Diagnosis present

## 2022-04-07 DIAGNOSIS — E538 Deficiency of other specified B group vitamins: Secondary | ICD-10-CM | POA: Diagnosis present

## 2022-04-07 DIAGNOSIS — N920 Excessive and frequent menstruation with regular cycle: Secondary | ICD-10-CM | POA: Diagnosis present

## 2022-04-07 DIAGNOSIS — E876 Hypokalemia: Secondary | ICD-10-CM | POA: Diagnosis present

## 2022-04-07 DIAGNOSIS — R6510 Systemic inflammatory response syndrome (SIRS) of non-infectious origin without acute organ dysfunction: Secondary | ICD-10-CM | POA: Insufficient documentation

## 2022-04-07 DIAGNOSIS — Z1152 Encounter for screening for COVID-19: Secondary | ICD-10-CM

## 2022-04-07 DIAGNOSIS — D649 Anemia, unspecified: Secondary | ICD-10-CM | POA: Diagnosis present

## 2022-04-07 DIAGNOSIS — J189 Pneumonia, unspecified organism: Secondary | ICD-10-CM | POA: Insufficient documentation

## 2022-04-07 DIAGNOSIS — F17290 Nicotine dependence, other tobacco product, uncomplicated: Secondary | ICD-10-CM | POA: Diagnosis present

## 2022-04-07 DIAGNOSIS — I2699 Other pulmonary embolism without acute cor pulmonale: Secondary | ICD-10-CM | POA: Diagnosis present

## 2022-04-07 DIAGNOSIS — E871 Hypo-osmolality and hyponatremia: Secondary | ICD-10-CM | POA: Diagnosis not present

## 2022-04-07 DIAGNOSIS — I2694 Multiple subsegmental pulmonary emboli without acute cor pulmonale: Principal | ICD-10-CM | POA: Diagnosis present

## 2022-04-07 DIAGNOSIS — M545 Low back pain, unspecified: Secondary | ICD-10-CM | POA: Diagnosis not present

## 2022-04-07 DIAGNOSIS — M25562 Pain in left knee: Secondary | ICD-10-CM | POA: Diagnosis present

## 2022-04-07 DIAGNOSIS — D509 Iron deficiency anemia, unspecified: Secondary | ICD-10-CM | POA: Diagnosis present

## 2022-04-07 DIAGNOSIS — E440 Moderate protein-calorie malnutrition: Secondary | ICD-10-CM | POA: Diagnosis present

## 2022-04-07 DIAGNOSIS — D696 Thrombocytopenia, unspecified: Secondary | ICD-10-CM | POA: Diagnosis present

## 2022-04-07 DIAGNOSIS — Z833 Family history of diabetes mellitus: Secondary | ICD-10-CM

## 2022-04-07 NOTE — ED Triage Notes (Signed)
Bib  gcems from home c/o abdominal pain on right side that radiates to right flank x2 days. Hurts worse with movement. Pt reports having this same pain a few years ago and was given medication, pt unsure of which one. Pt spitting up blood as well for 2 days. 95%RA 90 hr 130/80-bp Cbg- 116

## 2022-04-08 ENCOUNTER — Emergency Department (HOSPITAL_COMMUNITY): Payer: Medicaid Other

## 2022-04-08 ENCOUNTER — Inpatient Hospital Stay (HOSPITAL_COMMUNITY): Payer: Medicaid Other

## 2022-04-08 DIAGNOSIS — M545 Low back pain, unspecified: Secondary | ICD-10-CM | POA: Diagnosis not present

## 2022-04-08 DIAGNOSIS — I2699 Other pulmonary embolism without acute cor pulmonale: Secondary | ICD-10-CM

## 2022-04-08 DIAGNOSIS — F17290 Nicotine dependence, other tobacco product, uncomplicated: Secondary | ICD-10-CM | POA: Diagnosis present

## 2022-04-08 DIAGNOSIS — E876 Hypokalemia: Secondary | ICD-10-CM | POA: Diagnosis present

## 2022-04-08 DIAGNOSIS — I2694 Multiple subsegmental pulmonary emboli without acute cor pulmonale: Principal | ICD-10-CM

## 2022-04-08 DIAGNOSIS — D696 Thrombocytopenia, unspecified: Secondary | ICD-10-CM | POA: Diagnosis not present

## 2022-04-08 DIAGNOSIS — Z8249 Family history of ischemic heart disease and other diseases of the circulatory system: Secondary | ICD-10-CM | POA: Diagnosis not present

## 2022-04-08 DIAGNOSIS — D509 Iron deficiency anemia, unspecified: Secondary | ICD-10-CM | POA: Diagnosis present

## 2022-04-08 DIAGNOSIS — D649 Anemia, unspecified: Secondary | ICD-10-CM | POA: Diagnosis present

## 2022-04-08 DIAGNOSIS — M25562 Pain in left knee: Secondary | ICD-10-CM | POA: Diagnosis present

## 2022-04-08 DIAGNOSIS — Z1152 Encounter for screening for COVID-19: Secondary | ICD-10-CM | POA: Diagnosis not present

## 2022-04-08 DIAGNOSIS — R791 Abnormal coagulation profile: Secondary | ICD-10-CM | POA: Diagnosis present

## 2022-04-08 DIAGNOSIS — J189 Pneumonia, unspecified organism: Secondary | ICD-10-CM | POA: Diagnosis present

## 2022-04-08 DIAGNOSIS — D529 Folate deficiency anemia, unspecified: Secondary | ICD-10-CM | POA: Diagnosis present

## 2022-04-08 DIAGNOSIS — I2602 Saddle embolus of pulmonary artery with acute cor pulmonale: Secondary | ICD-10-CM | POA: Diagnosis not present

## 2022-04-08 DIAGNOSIS — E440 Moderate protein-calorie malnutrition: Secondary | ICD-10-CM | POA: Diagnosis present

## 2022-04-08 DIAGNOSIS — E871 Hypo-osmolality and hyponatremia: Secondary | ICD-10-CM | POA: Diagnosis not present

## 2022-04-08 DIAGNOSIS — D519 Vitamin B12 deficiency anemia, unspecified: Secondary | ICD-10-CM | POA: Diagnosis present

## 2022-04-08 DIAGNOSIS — E538 Deficiency of other specified B group vitamins: Secondary | ICD-10-CM | POA: Diagnosis present

## 2022-04-08 DIAGNOSIS — F17210 Nicotine dependence, cigarettes, uncomplicated: Secondary | ICD-10-CM | POA: Diagnosis present

## 2022-04-08 DIAGNOSIS — N920 Excessive and frequent menstruation with regular cycle: Secondary | ICD-10-CM | POA: Diagnosis present

## 2022-04-08 DIAGNOSIS — Z833 Family history of diabetes mellitus: Secondary | ICD-10-CM | POA: Diagnosis not present

## 2022-04-08 LAB — HEPARIN LEVEL (UNFRACTIONATED)
Heparin Unfractionated: <0.1 IU/mL — ABNORMAL LOW (ref 0.30–0.70)
Heparin Unfractionated: 0.25 IU/mL — ABNORMAL LOW (ref 0.30–0.70)
Heparin Unfractionated: 0.26 IU/mL — ABNORMAL LOW (ref 0.30–0.70)

## 2022-04-08 LAB — I-STAT CHEM 8, ED
BUN: 7 mg/dL (ref 6–20)
Calcium, Ion: 1.18 mmol/L (ref 1.15–1.40)
Chloride: 103 mmol/L (ref 98–111)
Creatinine, Ser: 0.6 mg/dL (ref 0.44–1.00)
Glucose, Bld: 110 mg/dL — ABNORMAL HIGH (ref 70–99)
HCT: 27 % — ABNORMAL LOW (ref 36.0–46.0)
Hemoglobin: 9.2 g/dL — ABNORMAL LOW (ref 12.0–15.0)
Potassium: 3.3 mmol/L — ABNORMAL LOW (ref 3.5–5.1)
Sodium: 137 mmol/L (ref 135–145)
TCO2: 22 mmol/L (ref 22–32)

## 2022-04-08 LAB — CBC WITH DIFFERENTIAL/PLATELET
Abs Immature Granulocytes: 0.03 10*3/uL (ref 0.00–0.07)
Basophils Absolute: 0 10*3/uL (ref 0.0–0.1)
Basophils Relative: 1 %
Eosinophils Absolute: 0.1 10*3/uL (ref 0.0–0.5)
Eosinophils Relative: 2 %
HCT: 28.6 % — ABNORMAL LOW (ref 36.0–46.0)
Hemoglobin: 9.5 g/dL — ABNORMAL LOW (ref 12.0–15.0)
Immature Granulocytes: 1 %
Lymphocytes Relative: 37 %
Lymphs Abs: 2.3 10*3/uL (ref 0.7–4.0)
MCH: 25.7 pg — ABNORMAL LOW (ref 26.0–34.0)
MCHC: 33.2 g/dL (ref 30.0–36.0)
MCV: 77.3 fL — ABNORMAL LOW (ref 80.0–100.0)
Monocytes Absolute: 0.7 10*3/uL (ref 0.1–1.0)
Monocytes Relative: 10 %
Neutro Abs: 3.2 10*3/uL (ref 1.7–7.7)
Neutrophils Relative %: 49 %
Platelets: 49 10*3/uL — ABNORMAL LOW (ref 150–400)
RBC: 3.7 MIL/uL — ABNORMAL LOW (ref 3.87–5.11)
RDW: 19.3 % — ABNORMAL HIGH (ref 11.5–15.5)
WBC: 6.4 10*3/uL (ref 4.0–10.5)
nRBC: 0 % (ref 0.0–0.2)

## 2022-04-08 LAB — RETICULOCYTES
Immature Retic Fract: 13.4 % (ref 2.3–15.9)
RBC.: 3.61 MIL/uL — ABNORMAL LOW (ref 3.87–5.11)
Retic Count, Absolute: 33.9 10*3/uL (ref 19.0–186.0)
Retic Ct Pct: 0.9 % (ref 0.4–3.1)

## 2022-04-08 LAB — LIPASE, BLOOD: Lipase: 30 U/L (ref 11–51)

## 2022-04-08 LAB — CBC
HCT: 25.2 % — ABNORMAL LOW (ref 36.0–46.0)
Hemoglobin: 8.8 g/dL — ABNORMAL LOW (ref 12.0–15.0)
MCH: 26.6 pg (ref 26.0–34.0)
MCHC: 34.9 g/dL (ref 30.0–36.0)
MCV: 76.1 fL — ABNORMAL LOW (ref 80.0–100.0)
Platelets: 43 10*3/uL — ABNORMAL LOW (ref 150–400)
RBC: 3.31 MIL/uL — ABNORMAL LOW (ref 3.87–5.11)
RDW: 19.5 % — ABNORMAL HIGH (ref 11.5–15.5)
WBC: 6.2 10*3/uL (ref 4.0–10.5)
nRBC: 0 % (ref 0.0–0.2)

## 2022-04-08 LAB — FOLATE: Folate: 4 ng/mL — ABNORMAL LOW (ref >5.9)

## 2022-04-08 LAB — COMPREHENSIVE METABOLIC PANEL
ALT: 9 U/L (ref 0–44)
AST: 15 U/L (ref 15–41)
Albumin: 2.9 g/dL — ABNORMAL LOW (ref 3.5–5.0)
Alkaline Phosphatase: 67 U/L (ref 38–126)
Anion gap: 11 (ref 5–15)
BUN: 7 mg/dL (ref 6–20)
CO2: 20 mmol/L — ABNORMAL LOW (ref 22–32)
Calcium: 8.5 mg/dL — ABNORMAL LOW (ref 8.9–10.3)
Chloride: 104 mmol/L (ref 98–111)
Creatinine, Ser: 0.74 mg/dL (ref 0.44–1.00)
GFR, Estimated: >60 mL/min (ref >60)
Glucose, Bld: 117 mg/dL — ABNORMAL HIGH (ref 70–99)
Potassium: 3.2 mmol/L — ABNORMAL LOW (ref 3.5–5.1)
Sodium: 135 mmol/L (ref 135–145)
Total Bilirubin: 0.3 mg/dL (ref 0.3–1.2)
Total Protein: 6.6 g/dL (ref 6.5–8.1)

## 2022-04-08 LAB — VITAMIN B12: Vitamin B-12: 150 pg/mL — ABNORMAL LOW (ref 180–914)

## 2022-04-08 LAB — RESP PANEL BY RT-PCR (RSV, FLU A&B, COVID)  RVPGX2
Influenza A by PCR: NEGATIVE
Influenza B by PCR: NEGATIVE
Resp Syncytial Virus by PCR: NEGATIVE
SARS Coronavirus 2 by RT PCR: NEGATIVE

## 2022-04-08 LAB — LACTATE DEHYDROGENASE: LDH: 112 U/L (ref 98–192)

## 2022-04-08 LAB — IRON AND TIBC
Iron: 21 ug/dL — ABNORMAL LOW (ref 28–170)
Saturation Ratios: 6 % — ABNORMAL LOW (ref 10.4–31.8)
TIBC: 330 ug/dL (ref 250–450)
UIBC: 309 ug/dL

## 2022-04-08 LAB — ECHOCARDIOGRAM COMPLETE
Area-P 1/2: 4.44 cm2
Calc EF: 57.4 %
S' Lateral: 3.3 cm
Single Plane A2C EF: 57.7 %
Single Plane A4C EF: 56.1 %
Weight: 2507.95 oz

## 2022-04-08 LAB — TROPONIN I (HIGH SENSITIVITY)
Troponin I (High Sensitivity): <2 ng/L (ref <18)
Troponin I (High Sensitivity): <2 ng/L (ref <18)

## 2022-04-08 LAB — HIV ANTIBODY (ROUTINE TESTING W REFLEX): HIV Screen 4th Generation wRfx: NONREACTIVE

## 2022-04-08 LAB — ANTITHROMBIN III: AntiThromb III Func: 110 % (ref 75–120)

## 2022-04-08 LAB — I-STAT BETA HCG BLOOD, ED (MC, WL, AP ONLY): I-stat hCG, quantitative: <5 m[IU]/mL (ref <5)

## 2022-04-08 LAB — FERRITIN: Ferritin: 2 ng/mL — ABNORMAL LOW (ref 11–307)

## 2022-04-08 LAB — IMMATURE PLATELET FRACTION: Immature Platelet Fraction: 33 % — ABNORMAL HIGH (ref 1.2–8.6)

## 2022-04-08 MED ORDER — POTASSIUM CHLORIDE IN NACL 20-0.9 MEQ/L-% IV SOLN
INTRAVENOUS | Status: DC
  Administered 2022-04-10: 100 mL/h via INTRAVENOUS
  Filled 2022-04-08 (×7): qty 1000

## 2022-04-08 MED ORDER — LIDOCAINE 5 % EX PTCH
1.0000 | MEDICATED_PATCH | CUTANEOUS | Status: DC
  Administered 2022-04-08 – 2022-04-10 (×3): 1 via TRANSDERMAL
  Filled 2022-04-08 (×4): qty 1

## 2022-04-08 MED ORDER — OXYCODONE HCL 5 MG PO TABS
5.0000 mg | ORAL_TABLET | ORAL | Status: DC | PRN
  Administered 2022-04-08 (×2): 5 mg via ORAL
  Filled 2022-04-08 (×2): qty 1

## 2022-04-08 MED ORDER — IOHEXOL 350 MG/ML SOLN
75.0000 mL | Freq: Once | INTRAVENOUS | Status: AC | PRN
Start: 2022-04-08 — End: 2022-04-08
  Administered 2022-04-08: 75 mL via INTRAVENOUS

## 2022-04-08 MED ORDER — HYDROMORPHONE HCL 1 MG/ML IJ SOLN
1.0000 mg | INTRAMUSCULAR | Status: AC | PRN
  Administered 2022-04-08 – 2022-04-09 (×5): 1 mg via INTRAVENOUS
  Filled 2022-04-08 (×5): qty 1

## 2022-04-08 MED ORDER — ACETAMINOPHEN 650 MG RE SUPP: 650.0000 mg | Freq: Four times a day (QID) | RECTAL | Status: DC | PRN

## 2022-04-08 MED ORDER — SODIUM CHLORIDE 0.9 % IV BOLUS
1000.0000 mL | Freq: Once | INTRAVENOUS | Status: AC
  Administered 2022-04-08: 1000 mL via INTRAVENOUS

## 2022-04-08 MED ORDER — HYDROMORPHONE HCL 1 MG/ML IJ SOLN
1.0000 mg | Freq: Once | INTRAMUSCULAR | Status: AC
  Administered 2022-04-08: 1 mg via INTRAVENOUS
  Filled 2022-04-08: qty 1

## 2022-04-08 MED ORDER — FENTANYL CITRATE PF 50 MCG/ML IJ SOSY
50.0000 ug | PREFILLED_SYRINGE | Freq: Once | INTRAMUSCULAR | Status: AC
  Administered 2022-04-08: 50 ug via INTRAVENOUS
  Filled 2022-04-08: qty 1

## 2022-04-08 MED ORDER — HEPARIN (PORCINE) 25000 UT/250ML-% IV SOLN
1450.0000 [IU]/h | INTRAVENOUS | Status: DC
  Administered 2022-04-08: 1050 [IU]/h via INTRAVENOUS
  Administered 2022-04-08: 1300 [IU]/h via INTRAVENOUS
  Administered 2022-04-09 – 2022-04-10 (×2): 1450 [IU]/h via INTRAVENOUS
  Filled 2022-04-08 (×4): qty 250

## 2022-04-08 MED ORDER — HEPARIN BOLUS VIA INFUSION
4000.0000 [IU] | Freq: Once | INTRAVENOUS | Status: AC
  Administered 2022-04-08: 4000 [IU] via INTRAVENOUS
  Filled 2022-04-08: qty 4000

## 2022-04-08 MED ORDER — HYDROMORPHONE HCL 1 MG/ML IJ SOLN
0.5000 mg | Freq: Once | INTRAMUSCULAR | Status: AC
  Administered 2022-04-08: 0.5 mg via INTRAVENOUS
  Filled 2022-04-08: qty 1

## 2022-04-08 MED ORDER — HYDROMORPHONE HCL 1 MG/ML IJ SOLN
1.0000 mg | INTRAMUSCULAR | Status: DC | PRN
  Administered 2022-04-08: 1 mg via INTRAVENOUS
  Filled 2022-04-08: qty 1

## 2022-04-08 MED ORDER — NALOXONE HCL 0.4 MG/ML IJ SOLN: 0.4000 mg | INTRAMUSCULAR | Status: DC | PRN

## 2022-04-08 MED ORDER — ONDANSETRON HCL 4 MG/2ML IJ SOLN: 4.0000 mg | Freq: Four times a day (QID) | INTRAMUSCULAR | Status: DC | PRN

## 2022-04-08 MED ORDER — FOLIC ACID 1 MG PO TABS
1.0000 mg | ORAL_TABLET | Freq: Every day | ORAL | Status: DC
  Administered 2022-04-09 – 2022-04-14 (×6): 1 mg via ORAL
  Filled 2022-04-08 (×6): qty 1

## 2022-04-08 MED ORDER — ACETAMINOPHEN 325 MG PO TABS
650.0000 mg | ORAL_TABLET | Freq: Four times a day (QID) | ORAL | Status: DC | PRN
  Administered 2022-04-08 – 2022-04-13 (×9): 650 mg via ORAL
  Filled 2022-04-08 (×11): qty 2

## 2022-04-08 MED ORDER — CYANOCOBALAMIN 1000 MCG/ML IJ SOLN
1000.0000 ug | Freq: Once | INTRAMUSCULAR | Status: AC
  Administered 2022-04-09: 1000 ug via INTRAMUSCULAR
  Filled 2022-04-08: qty 1

## 2022-04-08 MED ORDER — SODIUM CHLORIDE 0.9 % IV SOLN
250.0000 mg | Freq: Every day | INTRAVENOUS | Status: AC
  Administered 2022-04-09 – 2022-04-12 (×4): 250 mg via INTRAVENOUS
  Filled 2022-04-08 (×4): qty 20

## 2022-04-08 MED ORDER — POTASSIUM CHLORIDE CRYS ER 20 MEQ PO TBCR
40.0000 meq | EXTENDED_RELEASE_TABLET | Freq: Once | ORAL | Status: AC
  Administered 2022-04-08: 40 meq via ORAL
  Filled 2022-04-08: qty 2

## 2022-04-08 MED ORDER — TRAZODONE HCL 50 MG PO TABS
25.0000 mg | ORAL_TABLET | Freq: Every evening | ORAL | Status: DC | PRN
  Administered 2022-04-08 – 2022-04-13 (×2): 25 mg via ORAL
  Filled 2022-04-08 (×2): qty 1

## 2022-04-08 MED ORDER — ONDANSETRON HCL 4 MG/2ML IJ SOLN
4.0000 mg | Freq: Once | INTRAMUSCULAR | Status: AC
  Administered 2022-04-08: 4 mg via INTRAVENOUS
  Filled 2022-04-08: qty 2

## 2022-04-08 MED ORDER — MAGNESIUM HYDROXIDE 400 MG/5ML PO SUSP
30.0000 mL | Freq: Every day | ORAL | Status: DC | PRN
  Filled 2022-04-08 (×2): qty 30

## 2022-04-08 MED ORDER — ONDANSETRON HCL 4 MG PO TABS: 4.0000 mg | ORAL_TABLET | Freq: Four times a day (QID) | ORAL | Status: DC | PRN

## 2022-04-08 MED ORDER — PANTOPRAZOLE SODIUM 40 MG PO TBEC
40.0000 mg | DELAYED_RELEASE_TABLET | Freq: Every day | ORAL | Status: DC
  Administered 2022-04-08 – 2022-04-14 (×7): 40 mg via ORAL
  Filled 2022-04-08 (×7): qty 1

## 2022-04-08 MED ORDER — OXYCODONE HCL 5 MG PO TABS
10.0000 mg | ORAL_TABLET | Freq: Four times a day (QID) | ORAL | Status: DC | PRN
  Administered 2022-04-08 – 2022-04-10 (×4): 10 mg via ORAL
  Filled 2022-04-08 (×4): qty 2

## 2022-04-08 MED ORDER — PROCHLORPERAZINE EDISYLATE 10 MG/2ML IJ SOLN
7.5000 mg | Freq: Once | INTRAMUSCULAR | Status: AC
  Administered 2022-04-08: 7.5 mg via INTRAVENOUS
  Filled 2022-04-08: qty 2

## 2022-04-08 NOTE — H&P (Signed)
History and Physical    Patient: Jacqueline [name] IHK:[id number] DOB: 13-Feb-2001 DOA: 04/07/2022 DOS: the patient was seen and examined on 04/08/2022 PCP: Duard Larsen, MD  Patient coming from: Home  Chief Complaint:  Chief Complaint  Patient presents with   Abdominal Pain   HPI: Jacqueline [name] is a 22 y.o. female with medical history significant of bacterial vaginitis, Candida vaginitis, headache and prednisone, Kell isoimmunization during pregnancy, antepartum thrombocytopenia, postpartum hemorrhage mild THC abuse, maternal varicella, hemoglobin C trait, false positive RPR test who presented to the emergency department with a 2-day history of right-sided abdominal pain radiated to the flank worsened by movement associated with mild dyspnea, cough and hemoptysis.  She had a similar complaint a few years ago and was treated with medication, but does not remember what the treatment was.  She started having pain on the right lower chest wall/right flank since Saturday that worsen with inspiration.  She has also had some hemoptysis.  No fever, chills or night sweats. No sore throat, rhinorrhea or wheezing.Marland Kitchen  No chest pain, palpitations, diaphoresis, PND, orthopnea or pitting edema of the lower extremities.  No appetite changes, abdominal pain, diarrhea, constipation, melena or hematochezia.  No flank pain, dysuria, frequency or hematuria.  No polyuria, polydipsia, polyphagia or blurred vision.  No family history of embolism or female relatives with frequent miscarriages.  Lab work: CBC 0 Kinsel count 6.4, hemoglobin 9.5 g/dL platelets 49.  Troponin x 2 negative.  Lipase unremarkable.  Negative coronavirus, influenza A/B and RSV PCR.  CMP showed a Arentz count of 3.2 and CO2 of 20 mmol/L.  The rest of the electrolytes and renal function were normal after calcium correction.  Glucose 117 mg/deciliter and albumin 2.9 g/dL.  The rest of the LFTs were normal.  Imaging: 2 view chest radiograph showing mild  atelectasis at the right lung base.  CTA chest with multiple filling defects in the right lower lobe pulmonary arterial branches compatible with pulmonary emboli.  Airspace opacity of the right lung base could reflect pulmonary infarcts.  ED course: Initial vital signs were temperature 98.4 F, pulse 96, respiration 19, BP 108/68 mmHg O2 sat 98% on room air.  The patient received fentanyl 50 mcg IVP hydromorphone 0.5 mg IVP x 2, ondansetron 4 mg IVP x 1 and was started on a heparin infusion.   Review of Systems: As mentioned in the history of present illness. All other systems reviewed and are negative. Past Medical History:  Diagnosis Date   Bacterial vaginitis 07/10/2016   Candida vaginitis 07/24/2016   Headache in pregnancy, antepartum, third trimester 07/24/2016   Kell isoimmunization during pregnancy    Past Surgical History:  Procedure Laterality Date   CESAREAN SECTION N/A 09/04/2016   Procedure: CESAREAN SECTION;  Surgeon: Truett Mainland, DO;  Location: [facility];  Service: Obstetrics;  Laterality: N/A;   CESAREAN SECTION Bilateral    Social History:  reports that she has been smoking cigars. She has never used smokeless tobacco. She reports that she does not drink alcohol and does not use drugs.  No Known Allergies  Family History  Problem Relation Age of Onset   Hypertension Maternal Grandmother    Diabetes Maternal Grandmother    Cancer Neg Hx     Prior to Admission medications   Not on File    Physical Exam: Vitals:   04/08/22 0404 04/08/22 0500 04/08/22 0515 04/08/22 0617  BP: 103/66 106/69 101/66 103/67  Pulse: 96 85 87 90  Resp:  18 (!) 22 (!) 21 20  Temp:    99.4 F (37.4 C)  TempSrc:    Oral  SpO2: 100% 100% 100% 98%  Weight: 71.1 kg      Physical Exam Vitals and nursing note reviewed.  Constitutional:      General: She is awake. She is not in acute distress.    Appearance: She is well-developed and overweight.  HENT:     Head: Normocephalic.      Nose: No rhinorrhea.     Mouth/Throat:     Mouth: Mucous membranes are dry.  Eyes:     General: No scleral icterus.    Pupils: Pupils are equal, round, and reactive to light.  Neck:     Vascular: No JVD.  Cardiovascular:     Rate and Rhythm: Normal rate and regular rhythm.     Heart sounds: S1 normal and S2 normal.  Pulmonary:     Effort: Pulmonary effort is normal.     Breath sounds: No wheezing, rhonchi or rales.  Abdominal:     General: Bowel sounds are normal. There is no distension.     Palpations: Abdomen is rigid.     Tenderness: There is no abdominal tenderness. There is right CVA tenderness. There is no left CVA tenderness, guarding or rebound.  Musculoskeletal:     Cervical back: Neck supple.     Right lower leg: No edema.     Left lower leg: No edema.  Skin:    General: Skin is warm and dry.  Neurological:     General: No focal deficit present.     Mental Status: She is alert and oriented to person, place, and time.  Psychiatric:        Mood and Affect: Mood normal.        Behavior: Behavior normal. Behavior is cooperative.   Data Reviewed:  Results are pending, will review when available.  Assessment and Plan: Principal Problem:   Acute pulmonary embolism ([place]) Inpatient/PCU. Supplemental oxygen as needed. Continue heparin infusion per pharmacy. Oxycodone 5 mg every 4 hours as needed for pain. Hydromorphone 1 mg every 4 hours as needed for severe pain Coagulation panel still pending. Check lower extremity Doppler US to rule out DVT. Obtain echocardiogram to evaluate RV function and pressures.  Active Problems:   Microcytic anemia Secondary to heavy menstrual bleeding. This is likely worsened by chronic thrombocytopenia. Hematology consult has been requested.    Thrombocytopenia (HCC) Monitor platelet count closely. Will await hematology consult.    Hypokalemia Correcting. Follow-up potassium level.    Moderate protein malnutrition ([place]) In  the setting of acute loss anemia. Monitor albumin and total protein levels.      Advance Care Planning:   Code Status: Full Code   Consults: Hematology (Sheran Spine, MD)  Family Communication:   Severity of Illness: The appropriate patient status for this patient is INPATIENT. Inpatient status is judged to be reasonable and necessary in order to provide the required intensity of service to ensure the patient's safety. The patient's presenting symptoms, physical exam findings, and initial radiographic and laboratory data in the context of their chronic comorbidities is felt to place them at high risk for further clinical deterioration. Furthermore, it is not anticipated that the patient will be medically stable for discharge from the hospital within 2 midnights of admission.   * I certify that at the point of admission it is my clinical judgment that the patient will require inpatient hospital care spanning  beyond 2 midnights from the point of admission due to high intensity of service, high risk for further deterioration and high frequency of surveillance required.*  Author: Reubin Milan, MD 04/08/2022 7:50 AM  For on call review [email or web address].   This document was prepared using Dragon voice recognition software and may contain some unintended transcription errors.

## 2022-04-08 NOTE — Progress Notes (Addendum)
Temp elevated at start of shift 102 (oral/axillary). Pt with 4 blankets and several hot packs on, thermostat on 80. Refusing to remove blankets but agreeable for temp to be decreased. Repeat temp 100.9 (A). Pt again encouraged to remove some blankets but states that she is cold natured and "it hurts" to be cold. Repeat temp 102.2 (A/O). Socks and one blanket removed. Repeat temp 103.2. Tylenol administered. Explained to patient that it is difficult to assess if temp is actual or because she has so many blankets on but she again refused to remove blankets stating that she cannot be cold. Will continue to monitor for effectiveness of medication.   Continues to cough intermittently. Sputum thick, brownish colored. C/o shortness of breath and increasing pain with coughing. Also noted with ambulation and mobilization. Education provided regarding splinting with coughing to alleviate/manage pain to ribs. Also educate on use of ETCO@ monitoring after request to remove r/t discomfort. O2 sats 95-98% on RA. Respirations 24-30 at rest/increased (30-40) with ambulation and mobilization. Encouraged to call for assistance with ambulation/mobilization.  Repeat temp 100.2. Pt. Reports that she is feeling hot but still refusing to remove blankets. Deep breathing/relaxation/touch utilized for pain management.

## 2022-04-08 NOTE — Progress Notes (Signed)
Tubing has been ordered for the ETCO2 monitoring device.  ETCO2 monitor placed in room.  Patient vitals stable. Patient AXOX4.

## 2022-04-08 NOTE — Progress Notes (Signed)
Mobility Specialist Progress Note:   04/08/22 1053  Mobility  Activity Ambulated with assistance in hallway  Level of Assistance Standby assist, set-up cues, supervision of patient - no hands on  Assistive Device None  Distance Ambulated (ft) 150 ft  Activity Response Tolerated fair  Mobility Referral Yes  $Mobility charge 1 Mobility   Pt received in bed and agreeable. C/o 7.5/10 pain throughout ambulation, requiring 2x standing rest breaks. Pt returned to bed with all needs met and call bell in reach.   Andrey Campanile Mobility Specialist Please contact via SecureChat or  Rehab office at [phone]

## 2022-04-08 NOTE — Progress Notes (Signed)
Echocardiogram 2D Echocardiogram has been performed.  Jacqueline [name] 04/08/2022, 3:15 PM
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
## 2022-04-08 NOTE — Progress Notes (Signed)
ANTICOAGULATION CONSULT NOTE - Follow Up Consult  Pharmacy Consult for heparin Indication: pulmonary embolus  Labs: Recent Labs    04/08/22 0015 04/08/22 0030 04/08/22 0205 04/08/22 0525 04/08/22 1128 04/08/22 1935  HGB 9.5* 9.2*  --  8.8*  --   --   HCT 28.6* 27.0*  --  25.2*  --   --   PLT 49*  --   --  43*  --   --   HEPARINUNFRC  --   --   --  <0.10* 0.25* 0.26*  CREATININE 0.74 0.60  --   --   --   --   TROPONINIHS <2  --  <2  --   --   --     Assessment: 21yo female subtherapeutic on heparin with very little change in level despite rate increase; no infusion issues per RN but she does report some continued hemoptysis that the pt states is consistent with the hemoptysis she experienced before presenting to ED.  Goal of Therapy:  Heparin level 0.3-0.7 units/ml   Plan:  Will increase heparin infusion by 1-2 units/kg/hr to 1300 units/hr and check level in 6 hours.    Wynona Neat, PharmD, BCPS  04/08/2022,8:44 PM

## 2022-04-08 NOTE — Consult Note (Signed)
Hematology/Oncology Consult Note  Clinical Summary: [name] is a 22 year old female who presents with chest pain and was found to have pulmonary emboli.   Reason for Consult: Anemia/Thrombocytopenia/Pulmonary Embolism  HPI: [name] is a 22 year old female with medical history significant for anemia/thrombocytopenia who presents with pulmonary emboli.  Jacqueline [name] initially presented on 04/07/2022 with chest pain on her right side.  She reported has begun 2 days ago and hurt when she was taking a deep breath.  She is also having blood tinged sputum.  She notes that she had not recently had any surgeries/immobilizations and is not currently on any estrogen-based birth control.  She has no family history of VTE's.  On further discussion the patient reports that she does have occasional issues with constipation but denies any nausea, vomiting, or diarrhea.  She reports she on average eats about 1 meal per day as she is "just not hungry".  She notes her menstrual cycles tend to last greater than 7 days and she bleeds "a lot".  She is not able to quantify how many pads/tampons she goes through.  She reports that she occasionally smokes cigarettes and occasionally drinks alcohol, occasionally being about once every 2 weeks.  She currently works as a Char[employer] fundraiser.  She is not having any lightheadedness or dizziness but is having shortness of breath.  Her pain is currently poorly controlled.  She notes that she has never had issues with a blood clot before.  A full 10 point ROS was otherwise negative.  O:  Vitals:   04/08/22 0617 04/08/22 0800  BP: 103/67 119/82  Pulse: 90   Resp: 20 18  Temp: 99.4 F (37.4 C) 99 F (37.2 C)  SpO2: 98% 99%      Latest Ref Rng & Units 04/08/2022   12:30 AM 04/08/2022   12:15 AM 11/03/2021    2:20 PM  CMP  Glucose 70 - 99 mg/dL 110  117  84   BUN 6 - 20 mg/dL 7  7  4    Creatinine 0.44 - 1.00 mg/dL 0.60  0.74  0.50   Sodium 135 - 145 mmol/L 137  135   140   Potassium 3.5 - 5.1 mmol/L 3.3  3.2  3.4   Chloride 98 - 111 mmol/L 103  104  108   CO2 22 - 32 mmol/L  20    Calcium 8.9 - 10.3 mg/dL  8.5    Total Protein 6.5 - 8.1 g/dL  6.6    Total Bilirubin 0.3 - 1.2 mg/dL  0.3    Alkaline Phos 38 - 126 U/L  67    AST 15 - 41 U/L  15    ALT 0 - 44 U/L  9        Latest Ref Rng & Units 04/08/2022    5:25 AM 04/08/2022   12:30 AM 04/08/2022   12:15 AM  CBC  WBC 4.0 - 10.5 K/uL 6.2   6.4   Hemoglobin 12.0 - 15.0 g/dL 8.8  9.2  9.5   Hematocrit 36.0 - 46.0 % 25.2  27.0  28.6   Platelets 150 - 400 K/uL 43   49       GENERAL: well appearing young African-American female in NAD  SKIN: skin color, texture, turgor are normal, no rashes or significant lesions EYES: conjunctiva are pink and non-injected, sclera clear LUNGS: clear to auscultation and percussion with normal breathing effort HEART: regular rate & rhythm and no  murmurs and no lower extremity edema Musculoskeletal: no cyanosis of digits and no clubbing  PSYCH: alert & oriented x 3, fluent speech NEURO: no focal motor/sensory deficits  Assessment/Plan:  #Thrombocytopenia # Anemia, Microcytic -- Initial labs are concerning for severe folate, vitamin B12, and iron deficiencies. -- Recommend supplementing these nutrients.  Will start with ferric gluconate 250 mg IV daily, vitamin B12 injection 6301 mcg, and folic acid 1 mg p.o. -- Consider nutrition/gastroenterology consult for possible poor dietary intake or malabsorption. -- Patient has an elevation in immature platelet fraction, consistent with platelet destruction.  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  Will consider steroid pulse of steroids drop less than 30.  Continue to monitor platelets to see if they improve with nutritional supplementation -- Encourage patient to follow-up with OB/GYN for better control of her menstrual cycles -- Will have patient follow-up in hematology clinic in the outpatient setting  # Pulmonary Emboli,  Unprovoked. -- Continue heparin while inpatient.  Caution with platelets less than 50, would recommend discontinuation of anticoagulation if platelets drop less than 30 -- At time of discharge would recommend transition to Eliquis 10 mg twice daily x 7 days followed by 5 mg twice daily moving forward -- Due to the unprovoked nature of his VTE would recommend indefinite anticoagulation -- Will have patient follow-up in hematology clinic in the outpatient setting   Ledell Peoples, MD Department of Hematology/Oncology [place] [facility] Phone: [phone] Pager: [phone] Email: [email or web address]

## 2022-04-08 NOTE — Progress Notes (Signed)
ANTICOAGULATION CONSULT NOTE - Initial Consult  Pharmacy Consult for heparin Indication: pulmonary embolus  No Known Allergies  Patient Measurements: Weight: 71.1 kg (156 lb 12 oz) Heparin Dosing Weight: 67kg  Vital Signs: Temp: 98.3 F (36.8 C) (02/05 0342) Temp Source: Oral (02/05 0342) BP: 103/66 (02/05 0404) Pulse Rate: 96 (02/05 0404)  Labs: Recent Labs    04/08/22 0015 04/08/22 0030 04/08/22 0205  HGB 9.5* 9.2*  --   HCT 28.6* 27.0*  --   PLT 49*  --   --   CREATININE 0.74 0.60  --   TROPONINIHS <2  --  <2    Estimated Creatinine Clearance: 103.4 mL/min (by C-G formula based on SCr of 0.6 mg/dL).   Medical History: Past Medical History:  Diagnosis Date   Bacterial vaginitis 07/10/2016   Candida vaginitis 07/24/2016   Headache in pregnancy, antepartum, third trimester 07/24/2016   Kell isoimmunization during pregnancy     Medications:  (Not in a hospital admission)  Scheduled:  Infusions:   Assessment: Pt presented with abd pain. CT showed PE. She has a hx of DVT/PE per note. She has a hx of thrombocytopenia with a plt of 49k today.   Goal of Therapy:  Heparin level 0.3-0.7 units/ml Monitor platelets by anticoagulation protocol: Yes   Plan:  Heparin bolus 4000 units x1 Heparin infusion 1050 units/hr Check 6 hr HL  Monitor for bleeding  Onnie Boer, PharmD, BCIDP, AAHIVP, CPP Infectious Disease Pharmacist 04/08/2022 4:51 AM

## 2022-04-08 NOTE — Progress Notes (Signed)
ANTICOAGULATION CONSULT NOTE  Pharmacy Consult for heparin Indication: pulmonary embolus  No Known Allergies  Patient Measurements: Weight: 71.1 kg (156 lb 12 oz) Heparin Dosing Weight: 67kg  Vital Signs: Temp: 98.7 F (37.1 C) (02/05 0938) Temp Source: Oral (02/05 0938) BP: 120/72 (02/05 1023) Pulse Rate: 91 (02/05 1023)  Labs: Recent Labs    04/08/22 0015 04/08/22 0030 04/08/22 0205 04/08/22 0525 04/08/22 1128  HGB 9.5* 9.2*  --  8.8*  --   HCT 28.6* 27.0*  --  25.2*  --   PLT 49*  --   --  43*  --   HEPARINUNFRC  --   --   --  <0.10* 0.25*  CREATININE 0.74 0.60  --   --   --   TROPONINIHS <2  --  <2  --   --      Estimated Creatinine Clearance: 103.4 mL/min (by C-G formula based on SCr of 0.6 mg/dL).   Assessment: Pt presented with abd pain. CT showed PE. She has a hx of DVT/PE per note. She has a hx of thrombocytopenia with a plt of 49k on admit.  Heparin level is subtherapeutic at 0.25 on 1050 units/hr. No bleeding or infusion issues per RN. Hgb low 8-9s, platelets are low in 40s.   Goal of Therapy:  Heparin level 0.3-0.7 units/ml Monitor platelets by anticoagulation protocol: Yes   Plan:  Increase heparin infusion to 1200 units/hr 6 hr heparin level  Daily heparin level, CBC Monitor for s/sx of bleeding  Thank you for involving pharmacy in this patient's care.  Renold Genta, PharmD, BCPS Clinical Pharmacist Clinical phone for 04/08/2022 is [phone] 04/08/2022 1:38 PM

## 2022-04-08 NOTE — Progress Notes (Signed)
[place] admitting physician addendum:   The patient is still complaining of significant pleuritic right lower chest wall pain despite treatment with oxycodone 5 mg every 4 hours as needed and several doses of hydromorphone 1 mg IVP.  Lidoderm patch to area was added to her regimen as well.  She just recently told the staff that the 5 mg of oxycodone is not working and requested adjustment to her therapy regimen to 10 mg instead of 5.  Oxycodone 10 mg p.o. every 6 hours added.  Although she has a very valid reasons for pain in the setting of pleural irritation secondary to pulmonary infarct, given the high-dose of narcotics required to control her pain, as needed naloxone, ETCO2 monitoring with parameters has been ordered.  Tennis Must, MD.

## 2022-04-08 NOTE — Progress Notes (Signed)
Patient admitted from ED with pulmonary embolus. CHG bath given, gown changed. Oriented to unit and room. She has labored breathing and lung sounds are diminished with rhonchi in upper lobes. She complains of intense pain in left flank area. She is alert and oriented. Ambulated to bathroom with assist. Heparin IV running at 10.5/hr. CCMd called and notified. Fuller Canada, RN

## 2022-04-08 NOTE — Progress Notes (Signed)
ETCO2 monitoring started

## 2022-04-08 NOTE — ED Provider Notes (Signed)
  [facility] Provider Note   CSN: [id number] Arrival date & time: 04/07/22  2333     History {Add pertinent medical, surgical, social history, OB history to HPI:1} Chief Complaint  Patient presents with   Abdominal Pain    Jacqueline [name] is a 22 y.o. female.  HPI     Home Medications Prior to Admission medications   Medication Sig Start Date End Date Taking? Authorizing Provider  albuterol (VENTOLIN HFA) 108 (90 Base) MCG/ACT inhaler Inhale 1-2 puffs into the lungs every 6 (six) hours as needed for wheezing or shortness of breath. Patient not taking: Reported on 11/03/2021 05/22/21   Hazel Sams, PA-C  etonogestrel (NEXPLANON) 68 MG IMPL implant 68 mg by Subdermal route once.    [provider]  oxyCODONE (ROXICODONE) 5 MG immediate release tablet Take 1 tablet (5 mg total) by mouth every 4 (four) hours as needed for severe pain. 11/03/21   Sherrell Puller, PA-C      Allergies    Patient has no known allergies.    Review of Systems   Review of Systems  Physical Exam Updated Vital Signs Temp 98.4 F (36.9 C) (Oral)  Physical Exam  ED Results / Procedures / Treatments   Labs (all labs ordered are listed, but only abnormal results are displayed) Labs Reviewed - No data to display  EKG None  Radiology No results found.  Procedures Procedures  {Document cardiac monitor, telemetry assessment procedure when appropriate:1}  Medications Ordered in ED Medications - No data to display  ED Course/ Medical Decision Making/ A&P   {   Click here for ABCD2, HEART and other calculatorsREFRESH Note before signing :1}                          Medical Decision Making  ***  {Document critical care time when appropriate:1} {Document review of labs and clinical decision tools ie heart score, Chads2Vasc2 etc:1}  {Document your independent review of radiology images, and any outside records:1} {Document your discussion  with family members, caretakers, and with consultants:1} {Document social determinants of health affecting pt's care:1} {Document your decision making why or why not admission, treatments were needed:1} Final Clinical Impression(s) / ED Diagnoses Final diagnoses:  None    Rx / DC Orders ED Discharge Orders     None

## 2022-04-08 NOTE — Progress Notes (Signed)
[facility] admitting physician addendum:  The nursing staff just reported that the patient is still having significant pleuritic pain despite oxycodone and hydromorphone administration.  An extra dose of hydromorphone 1 mg plus prochlorperazine 7.5 mg IVP ordered.  I changed hydromorphone from every 4 hours to every 3 hours.  I have also ordered a Lidoderm patch to apply to affected area as the patient stated that heating pads were working at home.  Tennis Must, MD.

## 2022-04-08 NOTE — Progress Notes (Signed)
2D echo attempted, but patient wants pain meds prior to echo being done. Will try echo later.

## 2022-04-08 NOTE — Progress Notes (Signed)
VASCULAR LAB    Bilateral lower extremity venous duplex has been performed.  See CV proc for preliminary results.   Cesareo Vickrey, RVT 04/08/2022, 2:49 PM

## 2022-04-09 ENCOUNTER — Other Ambulatory Visit (HOSPITAL_COMMUNITY): Payer: Self-pay

## 2022-04-09 DIAGNOSIS — D509 Iron deficiency anemia, unspecified: Secondary | ICD-10-CM | POA: Diagnosis present

## 2022-04-09 DIAGNOSIS — E538 Deficiency of other specified B group vitamins: Secondary | ICD-10-CM | POA: Diagnosis present

## 2022-04-09 DIAGNOSIS — I2694 Multiple subsegmental pulmonary emboli without acute cor pulmonale: Secondary | ICD-10-CM | POA: Diagnosis not present

## 2022-04-09 LAB — HEPARIN LEVEL (UNFRACTIONATED)
Heparin Unfractionated: 0.26 IU/mL — ABNORMAL LOW (ref 0.30–0.70)
Heparin Unfractionated: 0.32 IU/mL (ref 0.30–0.70)
Heparin Unfractionated: 0.41 IU/mL (ref 0.30–0.70)

## 2022-04-09 LAB — CBC
HCT: 25.8 % — ABNORMAL LOW (ref 36.0–46.0)
Hemoglobin: 8.9 g/dL — ABNORMAL LOW (ref 12.0–15.0)
MCH: 26.5 pg (ref 26.0–34.0)
MCHC: 34.5 g/dL (ref 30.0–36.0)
MCV: 76.8 fL — ABNORMAL LOW (ref 80.0–100.0)
Platelets: 58 10*3/uL — ABNORMAL LOW (ref 150–400)
RBC: 3.36 MIL/uL — ABNORMAL LOW (ref 3.87–5.11)
RDW: 19.4 % — ABNORMAL HIGH (ref 11.5–15.5)
WBC: 10.6 10*3/uL — ABNORMAL HIGH (ref 4.0–10.5)
nRBC: 0 % (ref 0.0–0.2)

## 2022-04-09 LAB — BASIC METABOLIC PANEL
Anion gap: 10 (ref 5–15)
BUN: <5 mg/dL — ABNORMAL LOW (ref 6–20)
CO2: 18 mmol/L — ABNORMAL LOW (ref 22–32)
Calcium: 8.5 mg/dL — ABNORMAL LOW (ref 8.9–10.3)
Chloride: 106 mmol/L (ref 98–111)
Creatinine, Ser: 0.7 mg/dL (ref 0.44–1.00)
GFR, Estimated: >60 mL/min (ref >60)
Glucose, Bld: 115 mg/dL — ABNORMAL HIGH (ref 70–99)
Potassium: 4.1 mmol/L (ref 3.5–5.1)
Sodium: 134 mmol/L — ABNORMAL LOW (ref 135–145)

## 2022-04-09 LAB — HOMOCYSTEINE: Homocysteine: 11.8 umol/L (ref 0.0–14.5)

## 2022-04-09 MED ORDER — NICOTINE 14 MG/24HR TD PT24
14.0000 mg | MEDICATED_PATCH | Freq: Every day | TRANSDERMAL | Status: DC
  Administered 2022-04-09 – 2022-04-14 (×6): 14 mg via TRANSDERMAL
  Filled 2022-04-09 (×6): qty 1

## 2022-04-09 MED ORDER — HYDROMORPHONE HCL 1 MG/ML IJ SOLN
1.0000 mg | INTRAMUSCULAR | Status: AC | PRN
  Administered 2022-04-09 – 2022-04-10 (×5): 1 mg via INTRAVENOUS
  Filled 2022-04-09 (×5): qty 1

## 2022-04-09 MED ORDER — CYANOCOBALAMIN 1000 MCG/ML IJ SOLN
1000.0000 ug | Freq: Every day | INTRAMUSCULAR | Status: DC
  Filled 2022-04-09 (×2): qty 1

## 2022-04-09 MED ORDER — HYDROMORPHONE HCL 1 MG/ML IJ SOLN
1.0000 mg | INTRAMUSCULAR | Status: AC | PRN
  Administered 2022-04-09 (×5): 1 mg via INTRAVENOUS
  Filled 2022-04-09 (×5): qty 1

## 2022-04-09 NOTE — Progress Notes (Signed)
ANTICOAGULATION CONSULT NOTE - Follow Up Consult  Pharmacy Consult for Heparin Indication: pulmonary embolus  No Known Allergies  Patient Measurements: Height: 5\' 3"  (160 cm) Weight: 71.1 kg (156 lb 12 oz) IBW/kg (Calculated) : 52.4 Heparin Dosing Weight: 67 kg  Vital Signs: Temp: 99.7 F (37.6 C) (02/06 1208) Temp Source: Oral (02/06 1208) BP: 99/60 (02/06 1208) Pulse Rate: 102 (02/06 1208)  Labs: Recent Labs    04/08/22 0015 04/08/22 0030 04/08/22 0205 04/08/22 0525 04/08/22 1128 04/08/22 1935 04/09/22 0507 04/09/22 1132  HGB 9.5* 9.2*  --  8.8*  --   --  8.9*  --   HCT 28.6* 27.0*  --  25.2*  --   --  25.8*  --   PLT 49*  --   --  43*  --   --  58*  --   HEPARINUNFRC  --   --   --  <0.10*   < > 0.26* 0.26* 0.32  CREATININE 0.74 0.60  --   --   --   --  0.70  --   TROPONINIHS <2  --  <2  --   --   --   --   --    < > = values in this interval not displayed.    Estimated Creatinine Clearance: 103.4 mL/min (by C-G formula based on SCr of 0.7 mg/dL).  Assessment: Pt presented with abd pain. CTA showed PE. She has a hx of DVT/PE per note. She has a hx of thrombocytopenia with platelet count of 49k on admit.   Heparin level is now low therapeutic (0.32) on 1450 units/hr, after rate increase early this morning. RN reports no hemoptysis; brown sputum.   Platelet counts 58K this am, no drop since heparin started.  Heme/onc notes would need to hold anticoagulation if platelet count drops < 30K.  Planning to discharge on Eliquis.  Goal of Therapy:  Heparin level 0.3-0.7 units/ml Monitor platelets by anticoagulation protocol: Yes   Plan:  Continue heparin drip at 1450 units/hr Repeat heparin level tonight. Daily heparin level and CBC while on heparin. Monitor for signs/symptoms of bleeding.  Arty Baumgartner, [place] 04/09/2022,12:35 PM

## 2022-04-09 NOTE — TOC Benefit Eligibility Note (Signed)
Patient Teacher, English as a foreign language completed.    The patient is currently admitted and upon discharge could be taking Eliquis Starter Pack.  The current 30 day co-pay is $4.00.   The patient is insured through [place], [place] Patient Advocate Specialist [facility] Patient Advocate Team Direct Number: [phone]  Fax: [phone]

## 2022-04-09 NOTE — Plan of Care (Signed)

## 2022-04-09 NOTE — Progress Notes (Signed)
ANTICOAGULATION CONSULT NOTE  Pharmacy Consult for heparin Indication: pulmonary embolus  No Known Allergies  Patient Measurements: Weight: 71.1 kg (156 lb 12 oz) Heparin Dosing Weight: 67kg  Vital Signs: Temp: 100.2 F (37.9 C) (02/05 2324) Temp Source: Oral (02/05 2324) BP: 100/57 (02/05 2324) Pulse Rate: 112 (02/05 2324)  Labs: Recent Labs    04/08/22 0015 04/08/22 0030 04/08/22 0030 04/08/22 0205 04/08/22 0525 04/08/22 1128 04/08/22 1935 04/09/22 0507  HGB 9.5* 9.2*  --   --  8.8*  --   --  8.9*  HCT 28.6* 27.0*  --   --  25.2*  --   --  25.8*  PLT 49*  --   --   --  43*  --   --  58*  HEPARINUNFRC  --   --    < >  --  <0.10* 0.25* 0.26* 0.26*  CREATININE 0.74 0.60  --   --   --   --   --  0.70  TROPONINIHS <2  --   --  <2  --   --   --   --    < > = values in this interval not displayed.     Estimated Creatinine Clearance: 103.4 mL/min (by C-G formula based on SCr of 0.7 mg/dL).   Assessment: Pt presented with abd pain. CT showed PE. She has a hx of DVT/PE per note. She has a hx of thrombocytopenia with a plt of 49k on admit.  Heparin level is still subtherapeutic. Hemoptysis is stable per RN. We will increase and check another level.  Hgb ~9 Plt 58k Goal of Therapy:  Heparin level 0.3-0.7 units/ml Monitor platelets by anticoagulation protocol: Yes   Plan:  Increase heparin infusion to 1450 units/hr 6 hr heparin level  Daily heparin level, CBC Monitor for s/sx of bleeding  Onnie Boer, PharmD, BCIDP, AAHIVP, CPP Infectious Disease Pharmacist 04/09/2022 5:48 AM
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
## 2022-04-09 NOTE — Progress Notes (Signed)
ANTICOAGULATION CONSULT NOTE - Follow Up Consult  Pharmacy Consult for Heparin Indication: pulmonary embolus  No Known Allergies  Patient Measurements: Height: 5\' 3"  (160 cm) Weight: 71.1 kg (156 lb 12 oz) IBW/kg (Calculated) : 52.4 Heparin Dosing Weight: 67 kg  Vital Signs: Temp: 103 F (39.4 C) (02/06 1715) Temp Source: Oral (02/06 1715) BP: 130/69 (02/06 1720) Pulse Rate: 134 (02/06 1720)  Labs: Recent Labs    04/08/22 0015 04/08/22 0030 04/08/22 0205 04/08/22 0525 04/08/22 1128 04/09/22 0507 04/09/22 1132 04/09/22 1752  HGB 9.5* 9.2*  --  8.8*  --  8.9*  --   --   HCT 28.6* 27.0*  --  25.2*  --  25.8*  --   --   PLT 49*  --   --  43*  --  58*  --   --   HEPARINUNFRC  --   --   --  <0.10*   < > 0.26* 0.32 0.41  CREATININE 0.74 0.60  --   --   --  0.70  --   --   TROPONINIHS <2  --  <2  --   --   --   --   --    < > = values in this interval not displayed.     Estimated Creatinine Clearance: 103.4 mL/min (by C-G formula based on SCr of 0.7 mg/dL).  Assessment: Pt presented with abd pain. CTA showed PE. She has a hx of DVT/PE per note. She has a hx of thrombocytopenia with platelet count of 49k on admit.   Heparin level 0.41 units/mL (therapeutic) on heparin 1450 units/hr. No signs of bleeding. Plts chronically low.  Goal of Therapy:  Heparin level 0.3-0.7 units/ml Monitor platelets by anticoagulation protocol: Yes   Plan:  Continue heparin drip at 1450 units/hr Daily heparin level and CBC while on heparin. Monitor for signs/symptoms of bleeding. F/u transition to [place], PharmD Clinical Pharmacist 04/09/2022,6:56 PM

## 2022-04-10 DIAGNOSIS — J189 Pneumonia, unspecified organism: Secondary | ICD-10-CM | POA: Insufficient documentation

## 2022-04-10 DIAGNOSIS — R6510 Systemic inflammatory response syndrome (SIRS) of non-infectious origin without acute organ dysfunction: Secondary | ICD-10-CM | POA: Insufficient documentation

## 2022-04-10 HISTORY — DX: Pneumonia, unspecified organism: J18.9

## 2022-04-10 LAB — BETA-2-GLYCOPROTEIN I ABS, IGG/M/A
Beta-2 Glyco I IgG: <9 GPI IgG units (ref 0–20)
Beta-2-Glycoprotein I IgA: <9 GPI IgA units (ref 0–25)
Beta-2-Glycoprotein I IgM: 15 GPI IgM units (ref 0–32)

## 2022-04-10 LAB — PROTEIN C ACTIVITY: Protein C Activity: 74 % (ref 73–180)

## 2022-04-10 LAB — COMPREHENSIVE METABOLIC PANEL
ALT: 12 U/L (ref 0–44)
AST: 20 U/L (ref 15–41)
Albumin: 2.6 g/dL — ABNORMAL LOW (ref 3.5–5.0)
Alkaline Phosphatase: 73 U/L (ref 38–126)
Anion gap: 7 (ref 5–15)
BUN: <5 mg/dL — ABNORMAL LOW (ref 6–20)
CO2: 18 mmol/L — ABNORMAL LOW (ref 22–32)
Calcium: 8.2 mg/dL — ABNORMAL LOW (ref 8.9–10.3)
Chloride: 107 mmol/L (ref 98–111)
Creatinine, Ser: 0.77 mg/dL (ref 0.44–1.00)
GFR, Estimated: >60 mL/min (ref >60)
Glucose, Bld: 102 mg/dL — ABNORMAL HIGH (ref 70–99)
Potassium: 3.5 mmol/L (ref 3.5–5.1)
Sodium: 132 mmol/L — ABNORMAL LOW (ref 135–145)
Total Bilirubin: 1 mg/dL (ref 0.3–1.2)
Total Protein: 6.5 g/dL (ref 6.5–8.1)

## 2022-04-10 LAB — LUPUS ANTICOAGULANT PANEL
DRVVT: 114.8 s — ABNORMAL HIGH (ref 0.0–47.0)
PTT Lupus Anticoagulant: 90.5 s — ABNORMAL HIGH (ref 0.0–43.5)

## 2022-04-10 LAB — CBC
HCT: 24.3 % — ABNORMAL LOW (ref 36.0–46.0)
Hemoglobin: 8.5 g/dL — ABNORMAL LOW (ref 12.0–15.0)
MCH: 26.6 pg (ref 26.0–34.0)
MCHC: 35 g/dL (ref 30.0–36.0)
MCV: 76.2 fL — ABNORMAL LOW (ref 80.0–100.0)
Platelets: 77 10*3/uL — ABNORMAL LOW (ref 150–400)
RBC: 3.19 MIL/uL — ABNORMAL LOW (ref 3.87–5.11)
RDW: 18.8 % — ABNORMAL HIGH (ref 11.5–15.5)
WBC: 19.3 10*3/uL — ABNORMAL HIGH (ref 4.0–10.5)
nRBC: 0 % (ref 0.0–0.2)

## 2022-04-10 LAB — HEXAGONAL PHASE PHOSPHOLIPID: Hexagonal Phase Phospholipid: 66 s — ABNORMAL HIGH (ref 0–11)

## 2022-04-10 LAB — PROTEIN S ACTIVITY: Protein S Activity: 39 % — ABNORMAL LOW (ref 63–140)

## 2022-04-10 LAB — PHOSPHORUS: Phosphorus: 3 mg/dL (ref 2.5–4.6)

## 2022-04-10 LAB — MRSA NEXT GEN BY PCR, NASAL: MRSA by PCR Next Gen: DETECTED — AB

## 2022-04-10 LAB — LACTIC ACID, PLASMA: Lactic Acid, Venous: 0.8 mmol/L (ref 0.5–1.9)

## 2022-04-10 LAB — PROCALCITONIN: Procalcitonin: 0.9 ng/mL

## 2022-04-10 LAB — PROTEIN S, TOTAL: Protein S Ag, Total: 55 % — ABNORMAL LOW (ref 60–150)

## 2022-04-10 LAB — PTT-LA MIX: PTT-LA Mix: 91.7 s — ABNORMAL HIGH (ref 0.0–40.5)

## 2022-04-10 LAB — MAGNESIUM: Magnesium: 1.6 mg/dL — ABNORMAL LOW (ref 1.7–2.4)

## 2022-04-10 LAB — DRVVT CONFIRM: dRVVT Confirm: 2.5 ratio — ABNORMAL HIGH (ref 0.8–1.2)

## 2022-04-10 LAB — DRVVT MIX: dRVVT Mix: 96.2 s — ABNORMAL HIGH (ref 0.0–40.4)

## 2022-04-10 LAB — PROTEIN C, TOTAL: Protein C, Total: 62 % (ref 60–150)

## 2022-04-10 LAB — HEPARIN LEVEL (UNFRACTIONATED): Heparin Unfractionated: 0.45 IU/mL (ref 0.30–0.70)

## 2022-04-10 MED ORDER — SODIUM CHLORIDE 0.9 % IV SOLN
2.0000 g | Freq: Three times a day (TID) | INTRAVENOUS | Status: DC
  Administered 2022-04-10 – 2022-04-13 (×9): 2 g via INTRAVENOUS
  Filled 2022-04-10 (×9): qty 12.5

## 2022-04-10 MED ORDER — VITAMIN B-12 1000 MCG PO TABS
1000.0000 ug | ORAL_TABLET | Freq: Every day | ORAL | Status: DC
  Administered 2022-04-10 – 2022-04-14 (×5): 1000 ug via ORAL
  Filled 2022-04-10 (×5): qty 1

## 2022-04-10 MED ORDER — MAGNESIUM SULFATE 2 GM/50ML IV SOLN
2.0000 g | Freq: Once | INTRAVENOUS | Status: AC
  Administered 2022-04-10: 2 g via INTRAVENOUS
  Filled 2022-04-10: qty 50

## 2022-04-10 MED ORDER — APIXABAN 5 MG PO TABS
10.0000 mg | ORAL_TABLET | Freq: Two times a day (BID) | ORAL | Status: DC
  Administered 2022-04-10 – 2022-04-14 (×8): 10 mg via ORAL
  Filled 2022-04-10 (×8): qty 2

## 2022-04-10 MED ORDER — HYDROMORPHONE HCL 2 MG PO TABS
2.0000 mg | ORAL_TABLET | ORAL | Status: DC | PRN
  Administered 2022-04-10: 2 mg via ORAL
  Filled 2022-04-10: qty 1

## 2022-04-10 MED ORDER — SODIUM CHLORIDE 0.9 % IV SOLN: 2.0000 g | Freq: Three times a day (TID) | INTRAVENOUS | Status: DC

## 2022-04-10 MED ORDER — ADULT MULTIVITAMIN W/MINERALS CH
1.0000 | ORAL_TABLET | Freq: Every day | ORAL | Status: DC
  Administered 2022-04-10 – 2022-04-14 (×5): 1 via ORAL
  Filled 2022-04-10 (×5): qty 1

## 2022-04-10 MED ORDER — APIXABAN 5 MG PO TABS
10.0000 mg | ORAL_TABLET | Freq: Two times a day (BID) | ORAL | Status: DC
  Administered 2022-04-10: 10 mg via ORAL
  Filled 2022-04-10: qty 2

## 2022-04-10 MED ORDER — ACETAMINOPHEN 500 MG PO TABS
1000.0000 mg | ORAL_TABLET | Freq: Three times a day (TID) | ORAL | Status: DC
  Administered 2022-04-10 (×3): 1000 mg via ORAL
  Filled 2022-04-10 (×3): qty 2

## 2022-04-10 MED ORDER — NALOXONE HCL 0.4 MG/ML IJ SOLN: 0.4000 mg | INTRAMUSCULAR | Status: DC | PRN

## 2022-04-10 MED ORDER — HYDROMORPHONE HCL 2 MG PO TABS: 4.0000 mg | ORAL_TABLET | ORAL | Status: DC | PRN

## 2022-04-10 MED ORDER — VANCOMYCIN HCL IN DEXTROSE 1-5 GM/200ML-% IV SOLN
1000.0000 mg | Freq: Once | INTRAVENOUS | Status: AC
  Administered 2022-04-10: 1000 mg via INTRAVENOUS
  Filled 2022-04-10: qty 200

## 2022-04-10 MED ORDER — BOOST / RESOURCE BREEZE PO LIQD CUSTOM
1.0000 | Freq: Three times a day (TID) | ORAL | Status: DC
  Administered 2022-04-10: 1 via ORAL

## 2022-04-10 MED ORDER — DIPHENHYDRAMINE HCL 50 MG/ML IJ SOLN: 12.5000 mg | Freq: Four times a day (QID) | INTRAMUSCULAR | Status: DC | PRN

## 2022-04-10 MED ORDER — VANCOMYCIN HCL IN DEXTROSE 1-5 GM/200ML-% IV SOLN
1000.0000 mg | Freq: Two times a day (BID) | INTRAVENOUS | Status: DC
  Administered 2022-04-11 – 2022-04-13 (×6): 1000 mg via INTRAVENOUS
  Filled 2022-04-10 (×6): qty 200

## 2022-04-10 MED ORDER — HYDROMORPHONE HCL 2 MG PO TABS
1.0000 mg | ORAL_TABLET | Freq: Once | ORAL | Status: AC
  Administered 2022-04-10: 1 mg via ORAL
  Filled 2022-04-10: qty 1

## 2022-04-10 MED ORDER — SODIUM CHLORIDE 0.9 % IV SOLN
2.0000 g | Freq: Once | INTRAVENOUS | Status: AC
  Administered 2022-04-10: 2 g via INTRAVENOUS
  Filled 2022-04-10: qty 12.5

## 2022-04-10 MED ORDER — DIPHENHYDRAMINE HCL 12.5 MG/5ML PO ELIX: 12.5000 mg | ORAL_SOLUTION | Freq: Four times a day (QID) | ORAL | Status: DC | PRN

## 2022-04-10 MED ORDER — SODIUM CHLORIDE 0.9% FLUSH: 9.0000 mL | INTRAVENOUS | Status: DC | PRN

## 2022-04-10 MED ORDER — HYDROMORPHONE 1 MG/ML IV SOLN
INTRAVENOUS | Status: DC
  Administered 2022-04-10: 2.9 mg via INTRAVENOUS
  Administered 2022-04-10: 30 mg via INTRAVENOUS
  Administered 2022-04-11: 2.4 mg via INTRAVENOUS
  Administered 2022-04-11: 0.9 mg via INTRAVENOUS
  Administered 2022-04-11: 3.6 mg via INTRAVENOUS

## 2022-04-10 MED ORDER — OXYCODONE HCL 5 MG PO TABS
10.0000 mg | ORAL_TABLET | ORAL | Status: DC | PRN
  Administered 2022-04-10: 10 mg via ORAL
  Filled 2022-04-10: qty 2

## 2022-04-10 MED ORDER — ONDANSETRON HCL 4 MG/2ML IJ SOLN: 4.0000 mg | Freq: Four times a day (QID) | INTRAMUSCULAR | Status: DC | PRN

## 2022-04-10 MED ORDER — APIXABAN 5 MG PO TABS: 5.0000 mg | ORAL_TABLET | Freq: Two times a day (BID) | ORAL | Status: DC

## 2022-04-10 MED ORDER — HYDROMORPHONE HCL 2 MG PO TABS: 3.0000 mg | ORAL_TABLET | ORAL | Status: DC | PRN

## 2022-04-10 MED ORDER — METRONIDAZOLE 500 MG/100ML IV SOLN
500.0000 mg | Freq: Two times a day (BID) | INTRAVENOUS | Status: DC
  Administered 2022-04-10: 500 mg via INTRAVENOUS
  Filled 2022-04-10: qty 100

## 2022-04-10 NOTE — Progress Notes (Signed)
ANTICOAGULATION CONSULT NOTE - Follow Up Consult  Pharmacy Consult for Heparin Indication: pulmonary embolus  No Known Allergies  Patient Measurements: Height: 5\' 3"  (160 cm) Weight: 71.1 kg (156 lb 12 oz) IBW/kg (Calculated) : 52.4 Heparin Dosing Weight: 67 kg  Vital Signs: Temp: 98.7 F (37.1 C) (02/07 0823) Temp Source: Oral (02/07 0823) BP: 96/58 (02/07 0823) Pulse Rate: 94 (02/07 0823)  Labs: Recent Labs    04/08/22 0015 04/08/22 0030 04/08/22 0205 04/08/22 0525 04/08/22 1128 04/09/22 0507 04/09/22 1132 04/09/22 1752 04/10/22 0148  HGB 9.5* 9.2*  --  8.8*  --  8.9*  --   --  8.5*  HCT 28.6* 27.0*  --  25.2*  --  25.8*  --   --  24.3*  PLT 49*  --   --  43*  --  58*  --   --  77*  HEPARINUNFRC  --   --   --  <0.10*   < > 0.26* 0.32 0.41 0.45  CREATININE 0.74 0.60  --   --   --  0.70  --   --  0.77  TROPONINIHS <2  --  <2  --   --   --   --   --   --    < > = values in this interval not displayed.     Estimated Creatinine Clearance: 103.4 mL/min (by C-G formula based on SCr of 0.77 mg/dL).  Assessment: Pt presented with abd pain. CTA showed PE. She has a hx of DVT/PE per note. She has a hx of thrombocytopenia with platelet count of 49K on admit.   Heparin level remains therapeutic (0.45) on 1450 units/hr. Platelet count improved to 77K this am. Heme/onc notes would need to hold anticoagulation if platelet count drops < 30K.  Planning to discharge on Eliquis.  Goal of Therapy:  Heparin level 0.3-0.7 units/ml Monitor platelets by anticoagulation protocol: Yes   Plan:  Continue heparin drip at 1450 units/hr Daily heparin level and CBC while on heparin. Monitor for signs/symptoms of bleeding. F/u transition to Eliquis.  Arty Baumgartner, [place] 04/10/2022,8:52 AM

## 2022-04-10 NOTE — Assessment & Plan Note (Addendum)
After admission, developed fever, rusty sputum, frequent cough.  Sputum only normal respiratory flora. - Continue antibiotics, reduce to Rocephin

## 2022-04-10 NOTE — Progress Notes (Signed)
Pharmacy Antibiotic Note  Jacqueline [name] is a 22 y.o. female to begin empiric antibiotics for temp to 103 and unknown source of infection. Blood cultures sent last night. Pharmacy has been consulted for Vancomycin and Cefepime dosing x 7 days.  Metronidazole also starting.  Plan: Vancomycin 1gm IV x 1 stat per MD. Will continue Vancomycin 1gm IV q12h. Goal AUC 400-550. Expected AUC: 491 SCr used: 0.8 Cefepime 2gm IV q8hrs Metronidazole 500 mg IV q12h per MD Will follow renal function, culture data, clinical progress and antibiotic plans. Stop times are currently in place for 7 days of therapy. Vanc levels at steady state if indicated.   Height: 5\' 3"  (160 cm) Weight: 71.1 kg (156 lb 12 oz) IBW/kg (Calculated) : 52.4  Temp (24hrs), Avg:101.1 F (38.4 C), Min:98.7 F (37.1 C), Max:103 F (39.4 C)  Recent Labs  Lab 04/08/22 0015 04/08/22 0030 04/08/22 0525 04/09/22 0507 04/10/22 0148  WBC 6.4  --  6.2 10.6* 19.3*  CREATININE 0.74 0.60  --  0.70 0.77    Estimated Creatinine Clearance: 103.4 mL/min (by C-G formula based on SCr of 0.77 mg/dL).    No Known Allergies  Antimicrobials this admission: Vancomycin 2/7 >>(2/13) Cefepime 2/7 >> (2/13) Metronidazole 2/7 >> (2/13)  Dose adjustments this admission: n/a  Microbiology results: 2/5 COVID, flu and RSV: neg 2/5 HIV: non-reactive 2/6 blood: sent  Thank you for allowing pharmacy to be a part of this patient's care.  Arty Baumgartner, [place] 04/10/2022 9:27 AM

## 2022-04-10 NOTE — Plan of Care (Signed)

## 2022-04-10 NOTE — Assessment & Plan Note (Signed)
Supplement and monitor 

## 2022-04-10 NOTE — Progress Notes (Addendum)
Initial Nutrition Assessment  DOCUMENTATION CODES:   Not applicable  INTERVENTION:   Liberalize pt diet to regular due to poor appetite Encourage good PO intake Multivitamin w/ minerals daily Boost Breeze po TID, each supplement provides 250 kcal and 9 grams of protein  NUTRITION DIAGNOSIS:   Inadequate oral intake related to poor appetite as evidenced by per patient/family report.  GOAL:   Patient will meet greater than or equal to 90% of their needs  MONITOR:   PO intake, Supplement acceptance, Labs, I & O's  REASON FOR ASSESSMENT:   Consult Assessment of nutrition requirement/status  ASSESSMENT:   22 y.o. female presented to the ED with R side abdominal pain. No significant PMH. Pt admitted with acute pulmonary embolism, microcytic anemia, thrombocytopenia, and hypokalemia.   Pt laying in bed. Reports that her appetite has been poor for a little while PTA. Denies any changes at home leading to decreased PO intake. States that she typically has 1-2 meals per day; breakfast and then dinner, which is typically chicken and rice. Denies any nutritional supplement use at home. Endorses 20# weight loss since about September, reports a UBW of 160#. Per EMR, pt has not had any significant weight loss.   Discussed the use of oral nutrition supplements. Pt does not like milky items, but is agreeable to try the Boost Breeze. RD encouraged pt family/friends to bring outside food in as pt desires.   Medications reviewed and include: Vitamin T51, Folic Acid, Protonix, IV ferric gluconate Labs reviewed: Sodium 132 (L), Potassium 3.5, Phosphorus 3.0, Magnesium 1.6 (L), Folate 4.0 (L), Vitamin B12 150 (L)   NUTRITION - FOCUSED PHYSICAL EXAM:  Deferred due to pt pain level.   Diet Order:   Diet Order             Diet regular Room service appropriate? Yes; Fluid consistency: Thin  Diet effective now                   EDUCATION NEEDS:   Education needs have been  addressed  Skin:  Skin Assessment: Reviewed RN Assessment  Last BM:  Unknown  Height:  Ht Readings from Last 1 Encounters:  04/09/22 5\' 3"  (1.6 m)   Weight:  Wt Readings from Last 1 Encounters:  04/08/22 71.1 kg   Ideal Body Weight:  52.3 kg  BMI:  Body mass index is 27.77 kg/m.  Estimated Nutritional Needs:  Kcal:  1900-2100 Protein:  95-110 grams Fluid:  >/= 1.9 L   Hermina Barters RD, LDN Clinical Dietitian See [facility] for contact information.

## 2022-04-10 NOTE — Assessment & Plan Note (Addendum)
Platelets 50K at admission.  Hematology consulted, suspected nutritional deficiencies.  Platelets improving with vitamin supplementation, no bleeding, up to 90K today. - Continue B12, folate and MVI - Trend platelets - Consult Hematology, appreciate cares

## 2022-04-10 NOTE — Progress Notes (Signed)
  Progress Note   Patient: Jacqueline [name] MVE:[id number] DOB: 2000/12/03 DOA: 04/07/2022     2 DOS: the patient was seen and examined on 04/10/2022        Brief hospital course: 22 year old with history of antepartum thrombocytopenia, postpartum hemorrhage, smoker who presented to the emergency room with 2 days of severe right lower chest wall, right upper abdominal pain radiating to the flank and to the right shoulder along with shortness of breath, cough and streaky hemoptysis. In the emergency room hemoglobin 9.5, platelets 49. CT scan of the chest showed multiple right sided pulmonary emboli along with pulmonary infarct. She was admitted to the hospital on heparin infusion.      Assessment and Plan: SIRS (systemic inflammatory response syndrome) (HCC) Fever and rusty sputum, frequent cough.  I suspect this is pneumonia - Start antibiotics  Moderate protein malnutrition (HCC)    Hypokalemia Supplemented  Thrombocytopenia (HCC) Platelets improving with vitamin supplementation, no bleeding - Transition to Elqiuis - Trend platelets  Microcytic anemia - IV iron - Continue folate, B12  Acute pulmonary embolism (HCC) - Stop heparin - Start apixaban -Start PCA for difficult to control pain          Subjective: Patient having considerable migratory pain in the chest, has rusty sputum, has fever, no confusion, no vomiting.     Physical Exam: BP 101/60 (BP Location: Right Arm)   Pulse (!) 116   Temp 98.6 F (37 C) (Oral)   Resp (!) 23   Ht 5\' 3"  (1.6 m)   Wt 71.1 kg   SpO2 100%   BMI 27.77 kg/m   Thin adult female, uncomfortable, tachycardic, sitting up in bed, interactive Tachycardic, regular, no murmurs No peripheral edema Respiratory rate increased, lungs clear without rales or wheezes Abdomen soft without tenderness palpation or guarding, no ascites or distention Attention normal, affect appropriate   Data Reviewed: Sodium down to 132 Creatinine  normal Magnesium low Echocardiogram and DVT study unremarkable  Family Communication: Partner at the bedside    Disposition: Status is: Inpatient         Author: Edwin Dada, MD 04/10/2022 6:21 PM  For on call review [email or web address].

## 2022-04-10 NOTE — Assessment & Plan Note (Addendum)
Admitted with new PE, also apparent small pulmonary infarcts.  Pain has been difficult to control, requiring PCA.  Initially on heparin, transitioned to Eliquis, doing well. - Continue apixaban - Continue PCA pump

## 2022-04-10 NOTE — Progress Notes (Signed)
  X-cover Note: Persistent fevers. Already had blood cx X 2 on 04-09-2021.   Kristopher Oppenheim, DO [facility]

## 2022-04-10 NOTE — Assessment & Plan Note (Addendum)
Hematology consulted, recommended vitamin supplementation. Hgb stable, no clinical bleeding.  Completed course of IV iron. - Continue folate, B12

## 2022-04-10 NOTE — Progress Notes (Signed)
Pt has been unable to provide the urine specimen or the sputum specimen.  Notified the patient of the ordered specimens.  Explained the collection process.  The patient verbalized understanding.

## 2022-04-10 NOTE — Progress Notes (Signed)
Elink is following sepsis bundle. 

## 2022-04-10 NOTE — Progress Notes (Signed)
Mobility Specialist Progress Note:   04/10/22 1144  Mobility  Activity Dangled on edge of bed  Level of Assistance Independent  Assistive Device None  Activity Response Tolerated poorly  Mobility Referral Yes  $Mobility charge 1 Mobility   Pt received in bed and agreeable. C/o severe pain throughout session. Pt returned to supine with all needs met, call bell in reach, and family in room.   Andrey Campanile Mobility Specialist Please contact via SecureChat or  Rehab office at [phone]

## 2022-04-10 NOTE — Progress Notes (Signed)
Patient refused to get out of bed with assistance while CNAs changed her bed.  She has a [place].  She wants to keep the [facility] and does not want to walk to the bathroom.  She said that she will fall.

## 2022-04-10 NOTE — Discharge Instructions (Signed)
Information on my medicine - ELIQUIS (apixaban)  This medication education was reviewed with me or my healthcare representative as part of my discharge preparation.    Why was Eliquis prescribed for you? Eliquis was prescribed to treat blood clots that may have been found in the veins of your legs (deep vein thrombosis) or in your lungs (pulmonary embolism) and to reduce the risk of them occurring again.  What do You need to know about Eliquis ? The starting dose is 10 mg (two 5 mg tablets) taken TWICE daily for the FIRST SEVEN (7) DAYS, then on  04/17/22  the dose is reduced to ONE 5 mg tablet taken TWICE daily.  Eliquis may be taken with or without food.   Try to take the dose about the same time in the morning and in the evening. If you have difficulty swallowing the tablet whole please discuss with your pharmacist how to take the medication safely.  Take Eliquis exactly as prescribed and DO NOT stop taking Eliquis without talking to the doctor who prescribed the medication.  Stopping may increase your risk of developing a new blood clot.  Refill your prescription before you run out.  After discharge, you should have regular check-up appointments with your healthcare provider that is prescribing your Eliquis.    What do you do if you miss a dose? If a dose of ELIQUIS is not taken at the scheduled time, take it as soon as possible on the same day and twice-daily administration should be resumed. The dose should not be doubled to make up for a missed dose.  Important Safety Information A possible side effect of Eliquis is bleeding. You should call your healthcare provider right away if you experience any of the following: Bleeding from an injury or your nose that does not stop. Unusual colored urine (red or dark brown) or unusual colored stools (red or black). Unusual bruising for unknown reasons. A serious fall or if you hit your head (even if there is no bleeding).  Some  medicines may interact with Eliquis and might increase your risk of bleeding or clotting while on Eliquis. To help avoid this, consult your healthcare provider or pharmacist prior to using any new prescription or non-prescription medications, including herbals, vitamins, non-steroidal anti-inflammatory drugs (NSAIDs) and supplements.  This website has more information on Eliquis (apixaban): http://www.eliquis.com/eliquis/home

## 2022-04-10 NOTE — Hospital Course (Signed)
Jacqueline [name] is a 22 y.o. F with hx antepartum thrombocytopenia, postpartum hemorrhage, smoking who presented with few days pleuritic pain.    Int he ER, Hgb 9.5 g/dL, platelets 49K, and CTA showed multiple right sided pulmonary emboli along with pulmonary infarct.    2/4: Admitted on heparin gtt 2/5: Oncology consulted, started on B12, IV iron and folate 2/7: Fever noted, started on antibiotics for pneumonia 2/8: Pain poorly controlled, started on PCA

## 2022-04-11 ENCOUNTER — Encounter (HOSPITAL_COMMUNITY): Payer: Self-pay | Admitting: Family Medicine

## 2022-04-11 LAB — EXPECTORATED SPUTUM ASSESSMENT W GRAM STAIN, RFLX TO RESP C

## 2022-04-11 LAB — URINALYSIS, W/ REFLEX TO CULTURE (INFECTION SUSPECTED)
Bilirubin Urine: NEGATIVE
Glucose, UA: NEGATIVE mg/dL
Ketones, ur: NEGATIVE mg/dL
Leukocytes,Ua: NEGATIVE
Nitrite: NEGATIVE
Protein, ur: NEGATIVE mg/dL
Specific Gravity, Urine: 1.004 — ABNORMAL LOW (ref 1.005–1.030)
pH: 6 (ref 5.0–8.0)

## 2022-04-11 LAB — BASIC METABOLIC PANEL
Anion gap: 7 (ref 5–15)
BUN: <5 mg/dL — ABNORMAL LOW (ref 6–20)
CO2: 21 mmol/L — ABNORMAL LOW (ref 22–32)
Calcium: 8 mg/dL — ABNORMAL LOW (ref 8.9–10.3)
Chloride: 103 mmol/L (ref 98–111)
Creatinine, Ser: 0.63 mg/dL (ref 0.44–1.00)
GFR, Estimated: >60 mL/min (ref >60)
Glucose, Bld: 141 mg/dL — ABNORMAL HIGH (ref 70–99)
Potassium: 3.4 mmol/L — ABNORMAL LOW (ref 3.5–5.1)
Sodium: 131 mmol/L — ABNORMAL LOW (ref 135–145)

## 2022-04-11 LAB — CBC
HCT: 22.4 % — ABNORMAL LOW (ref 36.0–46.0)
Hemoglobin: 7.5 g/dL — ABNORMAL LOW (ref 12.0–15.0)
MCH: 26 pg (ref 26.0–34.0)
MCHC: 33.5 g/dL (ref 30.0–36.0)
MCV: 77.5 fL — ABNORMAL LOW (ref 80.0–100.0)
Platelets: 74 10*3/uL — ABNORMAL LOW (ref 150–400)
RBC: 2.89 MIL/uL — ABNORMAL LOW (ref 3.87–5.11)
RDW: 19.3 % — ABNORMAL HIGH (ref 11.5–15.5)
WBC: 16.1 10*3/uL — ABNORMAL HIGH (ref 4.0–10.5)
nRBC: 0 % (ref 0.0–0.2)

## 2022-04-11 LAB — CARDIOLIPIN ANTIBODIES, IGG, IGM, IGA
Anticardiolipin IgA: <9 APL U/mL (ref 0–11)
Anticardiolipin IgG: <9 GPL U/mL (ref 0–14)
Anticardiolipin IgM: 18 MPL U/mL — ABNORMAL HIGH (ref 0–12)

## 2022-04-11 MED ORDER — HYDROMORPHONE 1 MG/ML IV SOLN
INTRAVENOUS | Status: DC
  Administered 2022-04-11: 0.8 mg via INTRAVENOUS
  Administered 2022-04-11: 2.4 mg via INTRAVENOUS
  Administered 2022-04-12: 0.4 mg via INTRAVENOUS
  Administered 2022-04-12: 30 mg via INTRAVENOUS
  Administered 2022-04-12: 1.2 mg via INTRAVENOUS
  Administered 2022-04-14: 2 mL via INTRAVENOUS
  Filled 2022-04-11 (×3): qty 30

## 2022-04-11 MED ORDER — CHLORHEXIDINE GLUCONATE CLOTH 2 % EX PADS
6.0000 | MEDICATED_PAD | Freq: Every day | CUTANEOUS | Status: AC
  Administered 2022-04-11 – 2022-04-14 (×3): 6 via TOPICAL

## 2022-04-11 MED ORDER — MUPIROCIN 2 % EX OINT
1.0000 | TOPICAL_OINTMENT | Freq: Two times a day (BID) | CUTANEOUS | Status: DC
  Administered 2022-04-11 – 2022-04-14 (×7): 1 via NASAL
  Filled 2022-04-11 (×2): qty 22

## 2022-04-11 MED ORDER — POTASSIUM CHLORIDE CRYS ER 20 MEQ PO TBCR
40.0000 meq | EXTENDED_RELEASE_TABLET | Freq: Once | ORAL | Status: AC
  Administered 2022-04-11: 40 meq via ORAL
  Filled 2022-04-11: qty 2

## 2022-04-11 NOTE — Progress Notes (Addendum)
Sputum culture and urine culture are successfully collected. The urine might contain some blood due to Pt is having period.   Lab reports Pt is positive nasal MRSA swap. The treatment is started per protocol.  Pain is tolerated with PCA dilaudid. She is stable hemodynamically, afebrile, RR 24-30, shallow and short breathing due to pain. We will continue to monitor.   Kennyth Lose, RN

## 2022-04-11 NOTE — Progress Notes (Signed)
    04/11/22 2300  Mobility  HOB Elevated/Bed Position Self regulated  Activity Ambulated with assistance in hallway   Range of Motion/Exercises Active;All extremities  Level of Assistance Standby assist, set-up cues, supervision of patient - no hands on  Assistive Device None assistive device with ambulation. Using BSC in room  Distance Ambulated (ft) 470 ft  Activity Response Tolerated well, HR 120-130, EKG is ST during ambulation. No SOB. RR 24-26. Pain is well tolerated with PCA Dilaudid.   Transport method Ambulatory    Kennyth Lose, RN

## 2022-04-11 NOTE — Assessment & Plan Note (Signed)
See above

## 2022-04-11 NOTE — Progress Notes (Signed)
  Progress Note   Patient: Jacqueline [name] [id number] DOB: Jun 25, 2000 DOA: 04/07/2022     3 DOS: the patient was seen and examined on 04/11/2022 at 10:15AM      Brief hospital course: Jacqueline [name] is a 22 y.o. F with hx antepartum thrombocytopenia, postpartum hemorrhage, smoking who presented with few days pleuritic pain.    Int he ER, Hgb 9.5 g/dL, platelets 49K, and CTA showed multiple right sided pulmonary emboli along with pulmonary infarct.    2/4: Admitted on heparin gtt 2/5: Oncology consulted, started on B12, IV iron and folate 2/7: Fever noted, started on antibiotics for pneumonia 2/8: Pain poorly controlled, started on PCA     Assessment and Plan: * Acute pulmonary embolism (HCC) - Continue apixaban - Continue PCA pump  Community acquired pneumonia of right lower lobe of lung Fever and rusty sputum, frequent cough.   - Continue Vancomycin and Cefepime - Follow sputum  Thrombocytopenia (HCC) Platelets improving with vitamin supplementation, no bleeding, stable - Trend platelets - Consult Hematology, appreciate cares  Folic acid deficiency See above  Iron deficiency anemia See above  Moderate protein malnutrition ([place]) As evidenced by mineral and vitamin deficiency, loss of subcutaneous muscle mass and fat   Hypokalemia Supplemented  Microcytic anemia - Continue IV iron - Continue folate, B12          Subjective: Patient still has considerable chest pain, overall however she is improving since yesterday.  No confusion, no further fever, no vomiting.     Physical Exam: BP 108/64 (BP Location: Right Arm)   Pulse (!) 113   Temp (!) 101.6 F (38.7 C) (Oral)   Resp (!) 24   Ht 5\' 3"  (1.6 m)   Wt 71.1 kg   SpO2 99%   BMI 27.77 kg/m   Adult female, sitting up in bed, appears uncomfortable Tachycardic, regular, no murmurs, no peripheral edema Respiratory rate increased, effort shallow, rales in the right, air movement normal on the  left Abdomen soft no tenderness palpation or guarding Attention normal, affect normal, judgment insight appear normal   Data Reviewed: Sputum culture pending Mild sodium 131, potassium 3.4 Hemoglobin down to 7.5 Setter blood cell count 16 Platelets up to 74  Family Communication: Boyfriend at the bedside    Disposition: Status is: Inpatient         Author: Edwin Dada, MD 04/11/2022 5:21 PM  For on call review [email or web address].

## 2022-04-12 LAB — CBC
HCT: 20.3 % — ABNORMAL LOW (ref 36.0–46.0)
Hemoglobin: 7.1 g/dL — ABNORMAL LOW (ref 12.0–15.0)
MCH: 26.3 pg (ref 26.0–34.0)
MCHC: 35 g/dL (ref 30.0–36.0)
MCV: 75.2 fL — ABNORMAL LOW (ref 80.0–100.0)
Platelets: 99 K/uL — ABNORMAL LOW (ref 150–400)
RBC: 2.7 MIL/uL — ABNORMAL LOW (ref 3.87–5.11)
RDW: 19.7 % — ABNORMAL HIGH (ref 11.5–15.5)
WBC: 9.9 K/uL (ref 4.0–10.5)
nRBC: 0.2 % (ref 0.0–0.2)

## 2022-04-12 LAB — BASIC METABOLIC PANEL WITH GFR
Anion gap: 10 (ref 5–15)
BUN: 5 mg/dL — ABNORMAL LOW (ref 6–20)
CO2: 20 mmol/L — ABNORMAL LOW (ref 22–32)
Calcium: 8.1 mg/dL — ABNORMAL LOW (ref 8.9–10.3)
Chloride: 104 mmol/L (ref 98–111)
Creatinine, Ser: 0.53 mg/dL (ref 0.44–1.00)
GFR, Estimated: >60 mL/min
Glucose, Bld: 92 mg/dL (ref 70–99)
Potassium: 3.7 mmol/L (ref 3.5–5.1)
Sodium: 134 mmol/L — ABNORMAL LOW (ref 135–145)

## 2022-04-12 NOTE — Progress Notes (Signed)
  Progress Note   Patient: Jacqueline [name] DPO:[id number] DOB: Apr 09, 2000 DOA: 04/07/2022     4 DOS: the patient was seen and examined on 04/12/2022 at 9:42 AM      Brief hospital course: Jacqueline [name] is a 22 y.o. F with hx antepartum thrombocytopenia, postpartum hemorrhage, smoking who presented with few days pleuritic pain.    Int he ER, Hgb 9.5 g/dL, platelets 49K, and CTA showed multiple right sided pulmonary emboli along with pulmonary infarct.    2/4: Admitted on heparin gtt 2/5: Oncology consulted, started on B12, IV iron and folate 2/7: Fever noted, started on antibiotics for pneumonia 2/8: Pain poorly controlled, started on PCA     Assessment and Plan: * Acute pulmonary embolism (HCC) - Continue apixaban - Continue PCA pump  Community acquired pneumonia of right lower lobe of lung Fever and rusty sputum, frequent cough.   - Continue Vancomycin and Cefepime - Follow sputum  Thrombocytopenia (HCC) Platelets improving with vitamin supplementation, no bleeding, stable - Trend platelets - Consult Hematology, appreciate cares  Folic acid deficiency See above  Iron deficiency anemia See above  Moderate protein malnutrition ([place]) As evidenced by mineral and vitamin deficiency, loss of subcutaneous muscle mass and fat   Hypokalemia Supplemented  Microcytic anemia - Continue IV iron - Continue folate, B12          Subjective: Still with considerable pain, overall though feeling better, less weakness, less achy, less malaise, less pain, less dyspnea.     Physical Exam: BP 120/75 (BP Location: Left Arm)   Pulse 95   Temp (!) 97.1 F (36.2 C) (Oral)   Resp (!) 23   Ht 5\' 3"  (1.6 m)   Wt 71.1 kg   SpO2 97%   BMI 27.77 kg/m   Thin adult female, appears uncomfortable, sitting up in bed Tachycardic, no murmurs, still increased respiratory rate, no rales bilaterally No wheezing Abdomen soft without grimace to palpation Attention normal, affect normal,  judgment insight appear normal    Data Reviewed: Platelets up to 90,000   Family Communication: None at the bedside    Disposition: Status is: Inpatient Continues to require frequent dosing IV opiates        Author: Edwin Dada, MD 04/12/2022 4:25 PM  For on call review [email or web address].

## 2022-04-12 NOTE — Progress Notes (Signed)
wasted 4ML of Hydromorphone in stericycle  from PCA pump  from previous syringe before replacing . RN Angie witness    Heide Scales, RN

## 2022-04-12 NOTE — Progress Notes (Signed)
Mobility Specialist Progress Note:   04/12/22 1321  Mobility  Activity Refused mobility   Pt refused mobility d/t pain and fatigue. Will f/u as able.    Andrey Campanile Mobility Specialist Please contact via SecureChat or  Rehab office at [phone]

## 2022-04-12 NOTE — Progress Notes (Addendum)
Discussed with Aldona Bar, PA that Pt wants to go back home today. We will try to wean off her O2, by at first decreasing to 2 LPM and increasing her mobilities. Pt is able to ambulate independently in her room with no SOB on room air. We appreciate the level of her SPO2 if above 88-89% at room air because Pt is a current smoker 1 pack per day for 40 years. We will and off to the morning care team.   Kennyth Lose, RN

## 2022-04-12 NOTE — Progress Notes (Signed)
Arrived to room to place PIV per IV team consult.  Pt is on bedside commode at this time.

## 2022-04-13 ENCOUNTER — Inpatient Hospital Stay (HOSPITAL_COMMUNITY): Payer: Medicaid Other

## 2022-04-13 LAB — BASIC METABOLIC PANEL
Anion gap: 10 (ref 5–15)
BUN: <5 mg/dL — ABNORMAL LOW (ref 6–20)
CO2: 20 mmol/L — ABNORMAL LOW (ref 22–32)
Calcium: 8 mg/dL — ABNORMAL LOW (ref 8.9–10.3)
Chloride: 103 mmol/L (ref 98–111)
Creatinine, Ser: 0.59 mg/dL (ref 0.44–1.00)
GFR, Estimated: >60 mL/min (ref >60)
Glucose, Bld: 97 mg/dL (ref 70–99)
Potassium: 3.3 mmol/L — ABNORMAL LOW (ref 3.5–5.1)
Sodium: 133 mmol/L — ABNORMAL LOW (ref 135–145)

## 2022-04-13 LAB — CBC
HCT: 21.4 % — ABNORMAL LOW (ref 36.0–46.0)
Hemoglobin: 7.4 g/dL — ABNORMAL LOW (ref 12.0–15.0)
MCH: 26.3 pg (ref 26.0–34.0)
MCHC: 34.6 g/dL (ref 30.0–36.0)
MCV: 76.2 fL — ABNORMAL LOW (ref 80.0–100.0)
Platelets: 104 10*3/uL — ABNORMAL LOW (ref 150–400)
RBC: 2.81 MIL/uL — ABNORMAL LOW (ref 3.87–5.11)
RDW: 19.7 % — ABNORMAL HIGH (ref 11.5–15.5)
WBC: 8.9 10*3/uL (ref 4.0–10.5)
nRBC: 0.4 % — ABNORMAL HIGH (ref 0.0–0.2)

## 2022-04-13 LAB — CULTURE, RESPIRATORY W GRAM STAIN

## 2022-04-13 MED ORDER — OXYCODONE-ACETAMINOPHEN 5-325 MG PO TABS
1.0000 | ORAL_TABLET | ORAL | Status: DC | PRN
  Administered 2022-04-13 – 2022-04-14 (×5): 1 via ORAL
  Filled 2022-04-13 (×5): qty 1

## 2022-04-13 MED ORDER — POTASSIUM CHLORIDE CRYS ER 20 MEQ PO TBCR
40.0000 meq | EXTENDED_RELEASE_TABLET | ORAL | Status: AC
  Administered 2022-04-13 (×2): 40 meq via ORAL
  Filled 2022-04-13 (×2): qty 2

## 2022-04-13 MED ORDER — IOHEXOL 350 MG/ML SOLN
75.0000 mL | Freq: Once | INTRAVENOUS | Status: AC | PRN
  Administered 2022-04-13: 75 mL via INTRAVENOUS

## 2022-04-13 MED ORDER — SODIUM CHLORIDE 0.9 % IV SOLN
2.0000 g | INTRAVENOUS | Status: DC
  Administered 2022-04-13: 2 g via INTRAVENOUS
  Filled 2022-04-13: qty 20

## 2022-04-13 NOTE — Progress Notes (Signed)
  Progress Note   Patient: Jacqueline [name]:[id number] DOB: May 02, 2000 DOA: 04/07/2022     5 DOS: the patient was seen and examined on 04/13/2022 at 9:34AM      Brief hospital course: Ms Mabee is a 22 y.o. F with hx antepartum thrombocytopenia, postpartum hemorrhage, smoking who presented with few days pleuritic pain.    Int he ER, Hgb 9.5 g/dL, platelets 49K, and CTA showed multiple right sided pulmonary emboli along with pulmonary infarct.    2/4: Admitted on heparin gtt 2/5: Oncology consulted, started on B12, IV iron and folate 2/7: Fever noted, started on antibiotics for pneumonia 2/8: Pain poorly controlled, started on PCA     Assessment and Plan: * Acute pulmonary embolism ([place]) Admitted with new PE, also apparent small pulmonary infarcts.  Pain has been difficult to control, requiring PCA.  Initially on heparin, transitioned to Eliquis, doing well. - Continue apixaban - Continue PCA pump  Community acquired pneumonia of right lower lobe of lung After admission, developed fever, rusty sputum, frequent cough.  Sputum only normal respiratory flora. - Continue antibiotics, reduce to Rocephin   Thrombocytopenia (HCC) Platelets 50K at admission.  Hematology consulted, suspected nutritional deficiencies.  Platelets improving with vitamin supplementation, no bleeding, up to 90K today. - Continue B12, folate and MVI - Trend platelets - Consult Hematology, appreciate cares  Folic acid deficiency See above  Iron deficiency anemia See above  Moderate protein malnutrition ([place]) As evidenced by mineral and vitamin deficiency, loss of subcutaneous muscle mass and fat   Hypokalemia Supplemented  Microcytic anemia Hematology consulted, recommended vitamin supplementation. Hgb stable, no clinical bleeding.  Completed course of IV iron. - Continue folate, B12          Subjective: Still with pain but overall better.  Has low back pain today.  No confusion,  breathing okay, walking with PT.     Physical Exam: BP 104/62 (BP Location: Right Arm)   Pulse 100   Temp (!) 97.2 F (36.2 C) (Oral)   Resp 18   Ht 5\' 3"  (1.6 m)   Wt 71.1 kg   SpO2 100%   BMI 27.77 kg/m   Thin adult female, sitting in bed, interactive and appropriate, appears very uncomfortable due to pain RRR, no murmurs, no peripheral edema Respiratory rate normal, respirations very shallow due to pain Abdomen soft without grimace to palpation Attention normal, affect appropriate, judgment and insight appear normal    Data Reviewed: Discussed with pulmonology CT personally reviewed, shows consolidation in the right lower lobe very small pleural effusion Mild hyponatremia, mild hypokalemia and metabolic panel CBC shows normalized Creque blood cell count, anemia down to 7.4, platelets up to to 104   Family Communication: Partner at the bedside    Disposition: Status is: Inpatient Patient continues to require IV opiates for pain relief        Author: Edwin Dada, MD 04/13/2022 5:36 PM  For on call review [email or web address].

## 2022-04-13 NOTE — Progress Notes (Signed)
Pharmacy Antibiotic Note  Darolyn A Yang is a 22 y.o. female with community acquired pneumonia. Pharmacy consulted to dose Vancomycin and Cefepime x 7 days. WBCs trending down at 9.9. Patient is afebrile, and renal function stable and at baseline with Scr 0.59.   Plan: Continue Vancomycin 1gm IV q12h (eAUC 491.5, Scr rounded to 0.8, Vd 0.72, Goal AUC 400-550) Continue Cefepime 2gm IV q8hrs Will follow renal function, culture data, clinical progress and antibiotic plans Stop times are currently in place for 7 days of therapy  Height: 5' 3"$  (160 cm) Weight: 71.1 kg (156 lb 12 oz) IBW/kg (Calculated) : 52.4  Temp (24hrs), Avg:98.5 F (36.9 C), Min:97.1 F (36.2 C), Max:99.7 F (37.6 C)  Recent Labs  Lab 04/09/22 0507 04/10/22 0148 04/10/22 0951 04/11/22 0105 04/12/22 0858 04/13/22 0118  WBC 10.6* 19.3*  --  16.1* 9.9 8.9  CREATININE 0.70 0.77  --  0.63 0.53 0.59  LATICACIDVEN  --   --  0.8  --   --   --      Estimated Creatinine Clearance: 103.4 mL/min (by C-G formula based on SCr of 0.59 mg/dL).    No Known Allergies  Antimicrobials this admission: Vancomycin 2/7 >>(2/13) Cefepime 2/7 >> (2/13) Metronidazole 2/7 x1   Dose adjustments this admission:  Microbiology results: 2/5 COVID, flu and RSV: neg 2/5 HIV: non-reactive 2.7 MRSA pcr- detected  2/6 blood: ngtd 2/8 sputum cx: few gpr, rare gpc in pairs, rare gnr preincubating   Thank you for allowing pharmacy to be a part of this patient's care.  [name], PharmD PGY1 Pharmacy Resident   04/13/2022  11:58 AM

## 2022-04-14 LAB — CULTURE, BLOOD (ROUTINE X 2)
Culture: NO GROWTH
Culture: NO GROWTH
Special Requests: ADEQUATE
Special Requests: ADEQUATE

## 2022-04-14 LAB — BASIC METABOLIC PANEL
Anion gap: 9 (ref 5–15)
BUN: <5 mg/dL — ABNORMAL LOW (ref 6–20)
CO2: 23 mmol/L (ref 22–32)
Calcium: 8.4 mg/dL — ABNORMAL LOW (ref 8.9–10.3)
Chloride: 105 mmol/L (ref 98–111)
Creatinine, Ser: 0.56 mg/dL (ref 0.44–1.00)
GFR, Estimated: >60 mL/min (ref >60)
Glucose, Bld: 92 mg/dL (ref 70–99)
Potassium: 3.9 mmol/L (ref 3.5–5.1)
Sodium: 137 mmol/L (ref 135–145)

## 2022-04-14 LAB — CBC
HCT: 23.5 % — ABNORMAL LOW (ref 36.0–46.0)
Hemoglobin: 7.7 g/dL — ABNORMAL LOW (ref 12.0–15.0)
MCH: 25.8 pg — ABNORMAL LOW (ref 26.0–34.0)
MCHC: 32.8 g/dL (ref 30.0–36.0)
MCV: 78.9 fL — ABNORMAL LOW (ref 80.0–100.0)
Platelets: 104 10*3/uL — ABNORMAL LOW (ref 150–400)
RBC: 2.98 MIL/uL — ABNORMAL LOW (ref 3.87–5.11)
RDW: 21.1 % — ABNORMAL HIGH (ref 11.5–15.5)
WBC: 6.1 10*3/uL (ref 4.0–10.5)
nRBC: 0.5 % — ABNORMAL HIGH (ref 0.0–0.2)

## 2022-04-14 MED ORDER — OXYCODONE-ACETAMINOPHEN 5-325 MG PO TABS: 1.0000 | ORAL_TABLET | ORAL | 0 refills | Status: DC | PRN

## 2022-04-14 MED ORDER — FOLIC ACID 1 MG PO TABS: 1.0000 mg | ORAL_TABLET | Freq: Every day | ORAL | 1 refills | Status: DC

## 2022-04-14 MED ORDER — CYANOCOBALAMIN 500 MCG PO TABS: 500.0000 ug | ORAL_TABLET | Freq: Every day | ORAL | 1 refills | Status: DC

## 2022-04-14 MED ORDER — APIXABAN 5 MG PO TABS: ORAL_TABLET | ORAL | 0 refills | Status: DC

## 2022-04-14 MED ORDER — AMOXICILLIN-POT CLAVULANATE 875-125 MG PO TABS: 1.0000 | ORAL_TABLET | Freq: Two times a day (BID) | ORAL | 0 refills | Status: DC

## 2022-04-14 NOTE — TOC Transition Note (Signed)
Transition of Care [facility]) - CM/SW Discharge Note   Patient Details  Name: Jacqueline [name] MRN: [id number] Date of Birth: 08-11-2000  Transition of Care [facility]) CM/SW Contact:  Maebelle Munroe, [place] Phone Number: 04/14/2022, 12:14 PM   Clinical Narrative:  [facility] team spoke to patient via telephone. She is verbally responsive and pleasant stating she is listening to her church service. Discussed her discharge plan for home with oral antibiotics. Inquired if she had any barriers to getting medications. Patient share she does not. She voices she lives with her boyfriend (at bedside), who will transport and assist her at home. Denies any further need at this time.     Final next level of care: Home/Self Care Barriers to Discharge: No Barriers Identified   Patient Goals and CMS Choice      Discharge Placement                         Discharge Plan and Services Additional resources added to the After Visit Summary for                  DME Arranged: N/A         HH Arranged: NA          Social Determinants of Health (SDOH) Interventions SDOH Screenings   Tobacco Use: High Risk (04/11/2022)     Readmission Risk Interventions     No data to display

## 2022-04-14 NOTE — Discharge Summary (Addendum)
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 [place], [place] [address]   Expectorated Sputum Assessment w Gram Stain, Rflx to Resp Cult     Status: None   Collection Time: 04/11/22  4:01 AM   Specimen: Expectorated Sputum  Result Value Ref Range Status   Specimen Description EXPECTORATED SPUTUM  Final   Special Requests NONE  Final   Sputum evaluation   Final    THIS SPECIMEN IS ACCEPTABLE FOR SPUTUM CULTURE Performed at [facility], [place] [address], [place], [place] [address]    Report Status 04/11/2022 FINAL  Final  Culture, Respiratory w Gram Stain      Status: Abnormal   Collection Time: 04/11/22  4:01 AM  Result Value Ref Range Status   Specimen Description EXPECTORATED SPUTUM  Final   Special Requests NONE Reflexed from [id number]  Final   Gram Stain   Final    RARE WBC PRESENT, PREDOMINANTLY PMN FEW GRAM POSITIVE RODS RARE GRAM POSITIVE COCCI IN PAIRS RARE GRAM NEGATIVE RODS Performed at [facility], [place] [address] [place], [place] [address]    Culture (A)  Final    MULTIPLE ORGANISMS PRESENT, NONE PREDOMINANT Consistent with normal respiratory flora.   Report Status 04/13/2022 FINAL  Final    Labs: CBC: Recent Labs  Lab 04/08/22 0015 04/08/22 0030 04/10/22 0148 04/11/22 0105 04/12/22 0858 04/13/22 0118 04/14/22 0641  WBC 6.4   < > 19.3* 16.1* 9.9 8.9 6.1  NEUTROABS 3.2  --   --   --   --   --   --   HGB 9.5*   < > 8.5* 7.5* 7.1* 7.4* 7.7*  HCT 28.6*   < > 24.3* 22.4* 20.3* 21.4* 23.5*  MCV 77.3*   < > 76.2* 77.5* 75.2* 76.2* 78.9*  PLT 49*   < > 77* 74* 99* 104* 104*   < > = values in this interval not displayed.   Basic Metabolic Panel: Recent Labs  Lab 04/10/22 0148 04/11/22 0105 04/12/22 0858 04/13/22 0118 04/14/22 0641  NA 132* 131* 134* 133* 137  K 3.5 3.4* 3.7 3.3* 3.9  CL 107 103 104 103 105  CO2 18* 21* 20* 20* 23  GLUCOSE 102* 141* 92 97 92  BUN <5* <5* 5* <5* <5*  CREATININE 0.77 0.63 0.53 0.59 0.56  CALCIUM 8.2* 8.0* 8.1* 8.0* 8.4*  MG 1.6*  --   --   --   --   PHOS 3.0  --   --   --   --    Liver Function Tests: Recent Labs  Lab 04/08/22 0015 04/10/22 0148  AST 15  20  ALT 9 12  ALKPHOS 67 73  BILITOT 0.3 1.0  PROT 6.6 6.5  ALBUMIN 2.9* 2.6*   CBG: No results for input(s): "GLUCAP" in the last 168 hours.  Discharge time spent: approximately 35 minutes spent on discharge counseling, evaluation of patient on day of discharge, and coordination of discharge planning with nursing, social work, pharmacy and case management  Signed: Edwin Dada, MD [facility] 04/14/2022

## 2022-04-15 ENCOUNTER — Telehealth: Payer: Self-pay | Admitting: Hematology and Oncology

## 2022-04-15 LAB — METHYLMALONIC ACID, SERUM

## 2022-04-15 NOTE — Telephone Encounter (Signed)
Scheduled appt per 2/11 referral. Called pt using both numbers in chart, both numbers are disconnected. Mailed calendar to pt with appt information.

## 2022-04-16 LAB — PROTHROMBIN GENE MUTATION

## 2022-04-16 LAB — COPPER, SERUM: Copper: 126 ug/dL (ref 80–158)

## 2022-04-17 LAB — FACTOR 5 LEIDEN

## 2022-04-18 ENCOUNTER — Ambulatory Visit: Payer: Self-pay

## 2022-04-18 NOTE — Telephone Encounter (Signed)
  Chief Complaint: Worst HA of her life Symptoms: Pain right side of head Frequency: last night Pertinent Negatives: Patient denies  Disposition: [x] ED /[] Urgent Care (no appt availability in office) / [] Appointment(In office/virtual)/ []  [facility]/ [] Home Care/ [] Refused Recommended Disposition /[] [facility]/ []  Follow-up with PCP Additional Notes: Recently discharged from hospital for blood clots.   PT will go to ED now.  Reason for Disposition  [1] SEVERE headache (e.g., excruciating) AND [2] "worst headache" of life  Answer Assessment - Initial Assessment Questions 1. LOCATION: "Where does it hurt?"  Right side of head     2. ONSET: "When did the headache start?" (Minutes, hours or days)      Yesterday 3. PATTERN: "Does the pain come and go, or has it been constant since it started?"     constant 4. SEVERITY: "How bad is the pain?" and "What does it keep you from doing?"  (e.g., Scale 1-10; mild, moderate, or severe)   - MILD (1-3): doesn't interfere with normal activities    - MODERATE (4-7): interferes with normal activities or awakens from sleep    - SEVERE (8-10): excruciating pain, unable to do any normal activities        12/10 5. RECURRENT SYMPTOM: "Have you ever had headaches before?" If Yes, ask: "When was the last time?" and "What happened that time?"      no 6. CAUSE: "What do you think is causing the headache?"     unsure 7. MIGRAINE: "Have you been diagnosed with migraine headaches?" If Yes, ask: "Is this headache similar?"      no 8. HEAD INJURY: "Has there been any recent injury to the head?"      no 9. OTHER SYMPTOMS: "Do you have any other symptoms?" (fever, stiff neck, eye pain, sore throat, cold symptoms)     no 10. PREGNANCY: "Is there any chance you are pregnant?" "When was your last menstrual period?"  Protocols used: [facility]

## 2022-04-19 ENCOUNTER — Encounter (HOSPITAL_COMMUNITY): Payer: Self-pay

## 2022-04-19 ENCOUNTER — Other Ambulatory Visit: Payer: Self-pay

## 2022-04-19 ENCOUNTER — Emergency Department (HOSPITAL_COMMUNITY)
Admission: EM | Admit: 2022-04-19 | Discharge: 2022-04-19 | Disposition: A | Discharge status: Home / Self Care | Payer: Medicaid Other | Attending: Emergency Medicine | Admitting: Emergency Medicine

## 2022-04-19 ENCOUNTER — Emergency Department (HOSPITAL_COMMUNITY): Payer: Medicaid Other

## 2022-04-19 DIAGNOSIS — Z7901 Long term (current) use of anticoagulants: Secondary | ICD-10-CM | POA: Diagnosis not present

## 2022-04-19 DIAGNOSIS — R519 Headache, unspecified: Secondary | ICD-10-CM | POA: Diagnosis not present

## 2022-04-19 LAB — COMPREHENSIVE METABOLIC PANEL
ALT: 48 U/L — ABNORMAL HIGH (ref 0–44)
AST: 51 U/L — ABNORMAL HIGH (ref 15–41)
Albumin: 2.8 g/dL — ABNORMAL LOW (ref 3.5–5.0)
Alkaline Phosphatase: 89 U/L (ref 38–126)
Anion gap: 10 (ref 5–15)
BUN: 9 mg/dL (ref 6–20)
CO2: 22 mmol/L (ref 22–32)
Calcium: 8.6 mg/dL — ABNORMAL LOW (ref 8.9–10.3)
Chloride: 105 mmol/L (ref 98–111)
Creatinine, Ser: 0.62 mg/dL (ref 0.44–1.00)
GFR, Estimated: >60 mL/min (ref >60)
Glucose, Bld: 84 mg/dL (ref 70–99)
Potassium: 4.3 mmol/L (ref 3.5–5.1)
Sodium: 137 mmol/L (ref 135–145)
Total Bilirubin: 0.7 mg/dL (ref 0.3–1.2)
Total Protein: 7.4 g/dL (ref 6.5–8.1)

## 2022-04-19 LAB — CBC WITH DIFFERENTIAL/PLATELET
Abs Immature Granulocytes: 0.05 10*3/uL (ref 0.00–0.07)
Basophils Absolute: 0 10*3/uL (ref 0.0–0.1)
Basophils Relative: 0 %
Eosinophils Absolute: 0.1 10*3/uL (ref 0.0–0.5)
Eosinophils Relative: 1 %
HCT: 28.4 % — ABNORMAL LOW (ref 36.0–46.0)
Hemoglobin: 9.5 g/dL — ABNORMAL LOW (ref 12.0–15.0)
Immature Granulocytes: 1 %
Lymphocytes Relative: 31 %
Lymphs Abs: 2.4 10*3/uL (ref 0.7–4.0)
MCH: 27.5 pg (ref 26.0–34.0)
MCHC: 33.5 g/dL (ref 30.0–36.0)
MCV: 82.3 fL (ref 80.0–100.0)
Monocytes Absolute: 0.6 10*3/uL (ref 0.1–1.0)
Monocytes Relative: 8 %
Neutro Abs: 4.7 10*3/uL (ref 1.7–7.7)
Neutrophils Relative %: 59 %
Platelets: 153 10*3/uL (ref 150–400)
RBC: 3.45 MIL/uL — ABNORMAL LOW (ref 3.87–5.11)
RDW: 23.3 % — ABNORMAL HIGH (ref 11.5–15.5)
WBC: 7.9 10*3/uL (ref 4.0–10.5)
nRBC: 0 % (ref 0.0–0.2)

## 2022-04-19 LAB — I-STAT BETA HCG BLOOD, ED (MC, WL, AP ONLY): I-stat hCG, quantitative: <5 m[IU]/mL (ref <5)

## 2022-04-19 MED ORDER — ONDANSETRON HCL 4 MG/2ML IJ SOLN
4.0000 mg | Freq: Once | INTRAMUSCULAR | Status: AC
  Administered 2022-04-19: 4 mg via INTRAVENOUS
  Filled 2022-04-19: qty 2

## 2022-04-19 MED ORDER — SODIUM CHLORIDE 0.9 % IV BOLUS
1000.0000 mL | Freq: Once | INTRAVENOUS | Status: AC
  Administered 2022-04-19: 1000 mL via INTRAVENOUS

## 2022-04-19 MED ORDER — METOCLOPRAMIDE HCL 5 MG/ML IJ SOLN
10.0000 mg | Freq: Once | INTRAMUSCULAR | Status: AC
  Administered 2022-04-19: 10 mg via INTRAVENOUS
  Filled 2022-04-19: qty 2

## 2022-04-19 MED ORDER — IOHEXOL 350 MG/ML SOLN
75.0000 mL | Freq: Once | INTRAVENOUS | Status: AC | PRN
  Administered 2022-04-19: 75 mL via INTRAVENOUS

## 2022-04-19 MED ORDER — MORPHINE SULFATE (PF) 4 MG/ML IV SOLN
4.0000 mg | Freq: Once | INTRAVENOUS | Status: AC
  Administered 2022-04-19: 4 mg via INTRAVENOUS
  Filled 2022-04-19: qty 1

## 2022-04-19 MED ORDER — DEXAMETHASONE SODIUM PHOSPHATE 10 MG/ML IJ SOLN
10.0000 mg | Freq: Once | INTRAMUSCULAR | Status: AC
  Administered 2022-04-19: 10 mg via INTRAVENOUS
  Filled 2022-04-19: qty 1

## 2022-04-19 MED ORDER — DIPHENHYDRAMINE HCL 50 MG/ML IJ SOLN
12.5000 mg | Freq: Once | INTRAMUSCULAR | Status: AC
  Administered 2022-04-19: 12.5 mg via INTRAVENOUS
  Filled 2022-04-19: qty 1

## 2022-04-19 NOTE — ED Provider Notes (Signed)
[facility] Provider Note   CSN: [id number] Arrival date & time: 04/19/22  1402     History  Chief Complaint  Patient presents with   Headache    Jacqueline [name] is a 22 y.o. female.  22 year old female with recent admission for PE and PNA, on Eliquis presents with complaint of right side headache which she first noticed upon waking 3 days ago. Pain is constant, not relieved with OTC medications. Pain is worse with changes in position and movement of head. Denies changes in vision, speech, gait, fevers, rash, nausea, vomiting. No history of similar headaches previously.        Home Medications Prior to Admission medications   Medication Sig Start Date End Date Taking? Authorizing Provider  amoxicillin-clavulanate (AUGMENTIN) 875-125 MG tablet Take 1 tablet by mouth 2 (two) times daily. 04/14/22   Danford, Suann Larry, MD  apixaban (ELIQUIS) 5 MG TABS tablet Take apixaban 10 mg (2 tabs) twice daily until [facility] Feb 14 then reduce to apixaban 5 mg (1 tab) twice daily 04/14/22   Danford, Suann Larry, MD  cyanocobalamin (VITAMIN B12) 500 MCG tablet Take 1 tablet (500 mcg total) by mouth daily. 04/15/22   Danford, Suann Larry, MD  folic acid (FOLVITE) 1 MG tablet Take 1 tablet (1 mg total) by mouth daily. 04/15/22   Danford, Suann Larry, MD  oxyCODONE-acetaminophen (PERCOCET/ROXICET) 5-325 MG tablet Take 1 tablet by mouth every 4 (four) hours as needed for moderate pain. 04/14/22   Danford, Suann Larry, MD      Allergies    Patient has no known allergies.    Review of Systems   Review of Systems Negative except as per HPI Physical Exam Updated Vital Signs BP 102/62   Pulse 76   Temp 98.1 F (36.7 C) (Oral)   Resp 16   Ht 5' 4"$  (1.626 m)   Wt 63.5 kg   SpO2 99%   BMI 24.03 kg/m  Physical Exam Vitals and nursing note reviewed.  Constitutional:      General: She is not in acute distress.    Appearance: She is  well-developed. She is not diaphoretic.  HENT:     Head: Normocephalic and atraumatic.     Mouth/Throat:     Mouth: Mucous membranes are moist.     Pharynx: Oropharynx is clear.  Eyes:     General: No visual field deficit.    Extraocular Movements: Extraocular movements intact.     Pupils: Pupils are equal, round, and reactive to light.  Cardiovascular:     Rate and Rhythm: Normal rate and regular rhythm.     Heart sounds: Normal heart sounds.  Pulmonary:     Effort: Pulmonary effort is normal.     Breath sounds: Normal breath sounds.  Abdominal:     General: Bowel sounds are normal.     Palpations: Abdomen is soft.  Musculoskeletal:     Cervical back: Neck supple.  Lymphadenopathy:     Cervical: No cervical adenopathy.  Skin:    General: Skin is warm and dry.     Findings: No erythema or rash.  Neurological:     Mental Status: She is alert and oriented to person, place, and time.     GCS: GCS eye subscore is 4. GCS verbal subscore is 5. GCS motor subscore is 6.     Cranial Nerves: No cranial nerve deficit or facial asymmetry.     Sensory: No sensory deficit.  Motor: No weakness.     Coordination: Coordination normal.     Gait: Gait normal.     Deep Tendon Reflexes: Reflexes normal. Babinski sign absent on the right side. Babinski sign absent on the left side.  Psychiatric:        Behavior: Behavior normal.     ED Results / Procedures / Treatments   Labs (all labs ordered are listed, but only abnormal results are displayed) Labs Reviewed  CBC WITH DIFFERENTIAL/PLATELET - Abnormal; Notable for the following components:      Result Value   RBC 3.45 (*)    Hemoglobin 9.5 (*)    HCT 28.4 (*)    RDW 23.3 (*)    All other components within normal limits  COMPREHENSIVE METABOLIC PANEL - Abnormal; Notable for the following components:   Calcium 8.6 (*)    Albumin 2.8 (*)    AST 51 (*)    ALT 48 (*)    All other components within normal limits  URINALYSIS, ROUTINE W  REFLEX MICROSCOPIC  I-STAT BETA HCG BLOOD, ED ([facility], [facility], [facility] ONLY)    EKG None  Radiology No results found.  Procedures Procedures    Medications Ordered in ED Medications  sodium chloride 0.9 % bolus 1,000 mL (0 mLs Intravenous Stopped 04/19/22 1723)  ondansetron (ZOFRAN) injection 4 mg (4 mg Intravenous Given 04/19/22 1641)  morphine (PF) 4 MG/ML injection 4 mg (4 mg Intravenous Given 04/19/22 1644)  iohexol (OMNIPAQUE) 350 MG/ML injection 75 mL (75 mLs Intravenous Contrast Given 04/19/22 1839)    ED Course/ Medical Decision Making/ A&P                             Medical Decision Making Amount and/or Complexity of Data Reviewed Labs: ordered. Radiology: ordered.  Risk Prescription drug management.   This patient presents to the ED for concern of headache, this involves an extensive number of treatment options, and is a complaint that carries with it a high risk of complications and morbidity.  The differential diagnosis includes but not limited to migraine, sinus infection, dural venous sinus thrombosis, meningitis    Co morbidities that complicate the patient evaluation  Recent diagnosis of PE, on Eliquis, pneumonia   Additional history obtained:  External records from outside source obtained and reviewed including discharge summary dated 04/14/2022   Lab Tests:  I Ordered, and personally interpreted labs.  The pertinent results include: CBC with hemoglobin 9.5, improved compared to prior.  hCG negative.  CMP with mildly elevated AST and ALT.   Imaging Studies ordered:  I ordered imaging studies including CT venogram head I independently visualized and interpreted imaging which showed pending at time of signout to oncoming provider I agree with the radiologist interpretation   Consultations Obtained:  I requested consultation with the ER attending, Dr. Langston Masker,  and discussed lab and imaging findings as well as pertinent plan - they recommend: CT  venogram   Problem List / ED Course / Critical interventions / Medication management  22 year old female with complaint of right-sided headache x 3 days as above.  Neuroexam unremarkable, appears uncomfortable.  No nuchal rigidity on exam, is afebrile, no rash.  Case discussed with ER attending who has seen the patient, provided with pain medications and IV fluids, is pending CT venogram.  Care signed out at time of shift pending imaging and disposition. I ordered medication including morphine, zofran  for headache  Reevaluation of the patient  after these medicines showed that the patient stayed the same I have reviewed the patients home medicines and have made adjustments as needed   Social Determinants of Health:  Has PCP   Test / Admission - Considered:  Disposition pending at time of signout to oncoming provider         Final Clinical Impression(s) / ED Diagnoses Final diagnoses:  None    Rx / DC Orders ED Discharge Orders     None         Roque Lias 04/19/22 1841    Wyvonnia Dusky, MD 04/19/22 2125

## 2022-04-19 NOTE — ED Notes (Signed)
Encouraged to return with ongoing or worsening symptoms.

## 2022-04-19 NOTE — ED Triage Notes (Signed)
Pt BIB [facility] from home c/o a headache x3 days. Pt was recently seen here on Sunday for a blood clot and pneumonia.

## 2022-04-19 NOTE — ED Notes (Signed)
Unable to provide urine sample. Says she had to go so bad was unable to collect.

## 2022-04-19 NOTE — Discharge Instructions (Addendum)
You are having a headache. This condition is very commonly seen in the Emergency Department. No specific cause was found today for your headache. It may have been a migraine or another cause of headache. Stress, anxiety, fatigue, and depression are common triggers for headaches.     Fortunately, at this time, your headache today does not appear to be life-threatening or require hospitalization at this time. This may change in the future. Sometimes headaches can appear benign (not harmful), but then more serious symptoms can develop, which should prompt an immediate re-evaluation by your doctor or the emergency department.  It is very important that you speak to your primary care doctor in the next 48 hours, to re-evaluate your symptoms, and to determine whether you need any further diagnostic testing or treatment.   SEEK MEDICAL ATTENTION IF: - you develop reaction or side effects to the medications prescribed.  - your headache is not improving within 48 hours - you have multiple episodes of vomiting or can't drink fluids - you have a change in pattern from your usual headache (eg. New location, new intensity, new symptoms, etc)  RETURN IMMEDIATELY IF you develop a sudden, severe worsening of your headache or confusion, become poorly responsive, feel like passing out, develop a fever above 100.58F, have a change in speech, vision, or swallowing, develop new weakness, numbness, tingling, or incoordination, or have a seizure.

## 2022-04-19 NOTE — ED Provider Triage Note (Signed)
Emergency Medicine Provider Triage Evaluation Note  Jacqueline [name] , a 22 y.o. female  was evaluated in triage.  Pt complains of right-sided headache x 3 days.  Recently admitted to the hospital with pneumonia and PE, is anticoagulated.  Reports no relief of headache with OTC medications.  Headache is worse with changes in position, unable to turn head secondary to pain and head.  Reports gradual onset of pain, no unilateral weakness or numbness, no changes in gait..  Review of Systems  Positive: As above Negative: As above  Physical Exam  BP 103/67 (BP Location: Right Arm)   Pulse 88   Temp (!) 97.3 F (36.3 C) (Oral)   Resp 19   Ht 5' 4"$  (1.626 m)   Wt 63.5 kg   SpO2 99%   BMI 24.03 kg/m  Gen:   Awake, no distress, tearful Resp:  Normal effort  MSK:   Moves extremities without difficulty  Other:  Will not flex/extend head secondary to pain in head, no photophobia, no temporal tenderness  Medical Decision Making  Medically screening exam initiated at 2:33 PM.  Appropriate orders placed.  Jacqueline [name] was informed that the remainder of the evaluation will be completed by another provider, this initial triage assessment does not replace that evaluation, and the importance of remaining in the ED until their evaluation is complete.  Request patient placed in room for further exam and workup.  Labs initiated as well as CT head.   Tacy Learn, PA-C 04/19/22 1433

## 2022-05-01 ENCOUNTER — Inpatient Hospital Stay: Payer: Medicaid Other

## 2022-05-01 ENCOUNTER — Inpatient Hospital Stay: Payer: Medicaid Other | Attending: Hematology and Oncology | Admitting: Hematology and Oncology

## 2022-07-24 ENCOUNTER — Telehealth: Payer: Self-pay | Admitting: Hematology

## 2022-07-25 ENCOUNTER — Inpatient Hospital Stay: Payer: Medicaid Other

## 2022-07-25 ENCOUNTER — Inpatient Hospital Stay: Payer: Medicaid Other | Admitting: Hematology

## 2022-07-25 NOTE — Progress Notes (Incomplete)
HEMATOLOGY/ONCOLOGY CONSULTATION NOTE  Date of Service: 07/25/2022  Patient Care Team: [name], MD as PCP - General (Pediatrics)  CHIEF COMPLAINTS/PURPOSE OF CONSULTATION:  Evaluation and management of acute pulmonary embolism.  HISTORY OF PRESENTING ILLNESS:  Jacqueline [name] is a wonderful 22 y.o. female who has been referred to [place] by [name], MD for evaluation and management of acute pulmonary embolism.  Today,  -Discussed lab results from 04/19/2022 in detail with patient. CBC showed WBC of 7.9K, hemoglobin of 9.5, and platelets of 153K.     MEDICAL HISTORY:  Past Medical History:  Diagnosis Date   Bacterial vaginitis 07/10/2016   Candida vaginitis 07/24/2016   Headache in pregnancy, antepartum, third trimester 07/24/2016   Kell isoimmunization during pregnancy     SURGICAL HISTORY: Past Surgical History:  Procedure Laterality Date   CESAREAN SECTION N/A 09/04/2016   Procedure: CESAREAN SECTION;  Surgeon: Levie Heritage, DO;  Location: [facility];  Service: Obstetrics;  Laterality: N/A;   CESAREAN SECTION Bilateral     SOCIAL HISTORY: Social History   Socioeconomic History   Marital status: Single    Spouse name: Not on file   Number of children: Not on file   Years of education: Not on file   Highest education level: Not on file  Occupational History   Not on file  Tobacco Use   Smoking status: Some Days    Types: Cigars   Smokeless tobacco: Never   Tobacco comments:    1 B&M per day  Vaping Use   Vaping Use: Never used  Substance and Sexual Activity   Alcohol use: No    Alcohol/week: 0.0 standard drinks of alcohol   Drug use: No   Sexual activity: Yes    Partners: Male    Birth control/protection: None  Other Topics Concern   Not on file  Social History Narrative   [phone]- patient's confidential cell   Social Determinants of Health   Financial Resource Strain: Not on file  Food Insecurity: Not on file   Transportation Needs: No Transportation Needs (04/14/2022)   PRAPARE - Administrator, Civil Service (Medical): No    Lack of Transportation (Non-Medical): No  Physical Activity: Not on file  Stress: Not on file  Social Connections: Not on file  Intimate Partner Violence: Not on file    FAMILY HISTORY: Family History  Problem Relation Age of Onset   Hypertension Maternal Grandmother    Diabetes Maternal Grandmother    Cancer Neg Hx     ALLERGIES:  has No Known Allergies.  MEDICATIONS:  Current Outpatient Medications  Medication Sig Dispense Refill   amoxicillin-clavulanate (AUGMENTIN) 875-125 MG tablet Take 1 tablet by mouth 2 (two) times daily. 14 tablet 0   apixaban (ELIQUIS) 5 MG TABS tablet Take apixaban 10 mg (2 tabs) twice daily until [facility] Feb 14 then reduce to apixaban 5 mg (1 tab) twice daily 66 tablet 0   cyanocobalamin (VITAMIN B12) 500 MCG tablet Take 1 tablet (500 mcg total) by mouth daily. 30 tablet 1   folic acid (FOLVITE) 1 MG tablet Take 1 tablet (1 mg total) by mouth daily. 30 tablet 1   oxyCODONE-acetaminophen (PERCOCET/ROXICET) 5-325 MG tablet Take 1 tablet by mouth every 4 (four) hours as needed for moderate pain. 30 tablet 0   No current facility-administered medications for this visit.    REVIEW OF SYSTEMS:    10 Point review of Systems was done is negative except as  noted above.  PHYSICAL EXAMINATION: ECOG PERFORMANCE STATUS: {CHL ONC ECOG XB:[id number]}  .There were no vitals filed for this visit. There were no vitals filed for this visit. .There is no height or weight on file to calculate BMI.  GENERAL:alert, in no acute distress and comfortable SKIN: no acute rashes, no significant lesions EYES: conjunctiva are pink and non-injected, sclera anicteric OROPHARYNX: MMM, no exudates, no oropharyngeal erythema or ulceration NECK: supple, no JVD LYMPH:  no palpable lymphadenopathy in the cervical, axillary or inguinal regions LUNGS:  clear to auscultation b/l with normal respiratory effort HEART: regular rate & rhythm ABDOMEN:  normoactive bowel sounds , non tender, not distended. Extremity: no pedal edema PSYCH: alert & oriented x 3 with fluent speech NEURO: no focal motor/sensory deficits  LABORATORY DATA:  I have reviewed the data as listed  .    Latest Ref Rng & Units 04/19/2022    2:20 PM 04/14/2022    6:41 AM 04/13/2022    1:18 AM  CBC  WBC 4.0 - 10.5 K/uL 7.9  6.1  8.9   Hemoglobin 12.0 - 15.0 g/dL 9.5  7.7  7.4   Hematocrit 36.0 - 46.0 % 28.4  23.5  21.4   Platelets 150 - 400 K/uL 153  104  104     .    Latest Ref Rng & Units 04/19/2022    2:20 PM 04/14/2022    6:41 AM 04/13/2022    1:18 AM  CMP  Glucose 70 - 99 mg/dL 84  92  97   BUN 6 - 20 mg/dL 9  <5  <5   Creatinine 0.44 - 1.00 mg/dL 3.08  6.57  8.46   Sodium 135 - 145 mmol/L 137  137  133   Potassium 3.5 - 5.1 mmol/L 4.3  3.9  3.3   Chloride 98 - 111 mmol/L 105  105  103   CO2 22 - 32 mmol/L 22  23  20    Calcium 8.9 - 10.3 mg/dL 8.6  8.4  8.0   Total Protein 6.5 - 8.1 g/dL 7.4     Total Bilirubin 0.3 - 1.2 mg/dL 0.7     Alkaline Phos 38 - 126 U/L 89     AST 15 - 41 U/L 51     ALT 0 - 44 U/L 48        RADIOGRAPHIC STUDIES: I have personally reviewed the radiological images as listed and agreed with the findings in the report. No results found.  ASSESSMENT & PLAN:   22 y.o. female with:  acute pulmonary embolism   PLAN:  FOLLOW-UP: ***  The total time spent in the appointment was *** minutes* .  All of the patient's questions were answered with apparent satisfaction. The patient knows to call the clinic with any problems, questions or concerns.   Wyvonnia Lora MD MS AAHIVMS [facility] Hematology/Oncology Physician [facility]  .*Total Encounter Time as defined by the Centers for Medicare and Medicaid Services includes, in addition to the face-to-face time of a patient visit (documented in the note above)  non-face-to-face time: obtaining and reviewing outside history, ordering and reviewing medications, tests or procedures, care coordination (communications with other health care professionals or caregivers) and documentation in the medical record.    I,Mitra Faeizi,acting as a Neurosurgeon for Wyvonnia Lora, MD.,have documented all relevant documentation on the behalf of Wyvonnia Lora, MD,as directed by  Wyvonnia Lora, MD while in the presence of Wyvonnia Lora, MD.  ***

## 2022-07-26 ENCOUNTER — Inpatient Hospital Stay: Payer: Medicaid Other

## 2022-07-26 ENCOUNTER — Inpatient Hospital Stay: Payer: Medicaid Other | Attending: Hematology | Admitting: Hematology

## 2022-07-26 VITALS — BP 106/68 | HR 106 | Temp 98.0°F | Resp 20 | Wt 166.0 lb

## 2022-07-26 DIAGNOSIS — Z7901 Long term (current) use of anticoagulants: Secondary | ICD-10-CM | POA: Diagnosis not present

## 2022-07-26 DIAGNOSIS — E538 Deficiency of other specified B group vitamins: Secondary | ICD-10-CM | POA: Insufficient documentation

## 2022-07-26 DIAGNOSIS — E611 Iron deficiency: Secondary | ICD-10-CM | POA: Diagnosis not present

## 2022-07-26 DIAGNOSIS — R059 Cough, unspecified: Secondary | ICD-10-CM | POA: Insufficient documentation

## 2022-07-26 DIAGNOSIS — Z833 Family history of diabetes mellitus: Secondary | ICD-10-CM | POA: Diagnosis not present

## 2022-07-26 DIAGNOSIS — R519 Headache, unspecified: Secondary | ICD-10-CM | POA: Diagnosis not present

## 2022-07-26 DIAGNOSIS — N920 Excessive and frequent menstruation with regular cycle: Secondary | ICD-10-CM | POA: Diagnosis not present

## 2022-07-26 DIAGNOSIS — Z8249 Family history of ischemic heart disease and other diseases of the circulatory system: Secondary | ICD-10-CM | POA: Insufficient documentation

## 2022-07-26 DIAGNOSIS — Z803 Family history of malignant neoplasm of breast: Secondary | ICD-10-CM | POA: Insufficient documentation

## 2022-07-26 DIAGNOSIS — F17290 Nicotine dependence, other tobacco product, uncomplicated: Secondary | ICD-10-CM | POA: Insufficient documentation

## 2022-07-26 DIAGNOSIS — I2699 Other pulmonary embolism without acute cor pulmonale: Secondary | ICD-10-CM | POA: Insufficient documentation

## 2022-07-26 DIAGNOSIS — D500 Iron deficiency anemia secondary to blood loss (chronic): Secondary | ICD-10-CM

## 2022-07-26 NOTE — Progress Notes (Signed)
HEMATOLOGY/ONCOLOGY CONSULTATION NOTE  Date of Service: 07/26/2022  Patient Care Team: [name], MD as PCP - General (Pediatrics)  CHIEF COMPLAINTS/PURPOSE OF CONSULTATION:  Evaluation and management of Acute pulmonary embolism  HISTORY OF PRESENTING ILLNESS:  Jacqueline [name] is a wonderful 22 y.o. female who has been referred to [place] by [name], MD for evaluation and management of Acute pulmonary embolism.  Patient presented to the ED on 04/19/2022 for right-sided headache.  Today, she reports that since being hospitalized in February, she does experience pain in her right lower lungs. Patient denies any medical issues prior to her hospitalization. Her symptoms progressively worsened over the course of 3 day prior to her hospitalization. Patient had no leg pain or swelling at the time and no obvious clots were  found on [place]. She did lose about 30 pounds over the course of one month prior to her February hospitalization. Patient's weight has since stabilized. Patient was not on any other supplements or medications prior to being hospitalized.  Patient was deficient iron, folic acid, and B12 in February. She reports that she generally consumed meals once daily due to working frequently. Patient does regularly drink water.  She complains of severe headaches which are persistent and occur during the night. Her headaches have occurred for about a month. She notes that the tension/pain only occurs in her right temple. Patient does experience flashing light symptoms with her headaches. CT venogram 04/19/2022 did not show any abnormal findings.  Patient continues to have a lingering cough since  being infected with pneumonia. She denies any recent viral or COVID-19 infections. Her breathing has slightly improved. Patient continues to have a dry and productive cough with phlegm. She did smoke two cigarettes daily of tobacco-based Black and Mild.  She does generally take Eliquis on  a regular basis, but has not for the past 3 days due to running out. Patient has since had this refilled.   She did previously have a Nexplanon implantation, but had it removed in December 2023. She complains of heavy menstrual bleeding with plenty of blood clots. Her menstrual cycles typically last 5 days. Her menstrual cycle was regular after her Nexplanon implant was removed. Patient does not wish to be on any birth control at this time. She did endorse menstrual cycles while being off of blood thinners.   She reports that her grandmother had breast cancer and passed away at 49. She did not have any genetic mutations. She denies any fhx of autoimmune conditions but does note a fhx of diabetes mellitus. She denies any hx of blood clot in past. She denies any family history of blood clots.  She denies any new lumps/bumps, back pain, abdominal pain, pain over the sinuses, or leg swelling. She denies any joint pain, new rashes, nausea, or dizziness. She denies any long distance travel. Patient has a 18 and 16 year-old child.   MEDICAL HISTORY:  Past Medical History:  Diagnosis Date   Bacterial vaginitis 07/10/2016   Candida vaginitis 07/24/2016   Headache in pregnancy, antepartum, third trimester 07/24/2016   Kell isoimmunization during pregnancy     SURGICAL HISTORY: Past Surgical History:  Procedure Laterality Date   CESAREAN SECTION N/A 09/04/2016   Procedure: CESAREAN SECTION;  Surgeon: Levie Heritage, DO;  Location: [facility];  Service: Obstetrics;  Laterality: N/A;   CESAREAN SECTION Bilateral     SOCIAL HISTORY: Social History   Socioeconomic History   Marital status: Single    Spouse  name: Not on file   Number of children: Not on file   Years of education: Not on file   Highest education level: Not on file  Occupational History   Not on file  Tobacco Use   Smoking status: Some Days    Types: Cigars   Smokeless tobacco: Never   Tobacco comments:    1 B&M per day   Vaping Use   Vaping Use: Never used  Substance and Sexual Activity   Alcohol use: No    Alcohol/week: 0.0 standard drinks of alcohol   Drug use: No   Sexual activity: Yes    Partners: Male    Birth control/protection: None  Other Topics Concern   Not on file  Social History Narrative   [phone]- patient's confidential cell   Social Determinants of Health   Financial Resource Strain: Not on file  Food Insecurity: Not on file  Transportation Needs: No Transportation Needs (04/14/2022)   PRAPARE - Administrator, Civil Service (Medical): No    Lack of Transportation (Non-Medical): No  Physical Activity: Not on file  Stress: Not on file  Social Connections: Not on file  Intimate Partner Violence: Not on file    FAMILY HISTORY: Family History  Problem Relation Age of Onset   Hypertension Maternal Grandmother    Diabetes Maternal Grandmother    Cancer Neg Hx     ALLERGIES:  has No Known Allergies.  MEDICATIONS:  Current Outpatient Medications  Medication Sig Dispense Refill   amoxicillin-clavulanate (AUGMENTIN) 875-125 MG tablet Take 1 tablet by mouth 2 (two) times daily. 14 tablet 0   apixaban (ELIQUIS) 5 MG TABS tablet Take apixaban 10 mg (2 tabs) twice daily until [facility] Feb 14 then reduce to apixaban 5 mg (1 tab) twice daily 66 tablet 0   cyanocobalamin (VITAMIN B12) 500 MCG tablet Take 1 tablet (500 mcg total) by mouth daily. 30 tablet 1   folic acid (FOLVITE) 1 MG tablet Take 1 tablet (1 mg total) by mouth daily. 30 tablet 1   oxyCODONE-acetaminophen (PERCOCET/ROXICET) 5-325 MG tablet Take 1 tablet by mouth every 4 (four) hours as needed for moderate pain. 30 tablet 0   No current facility-administered medications for this visit.    REVIEW OF SYSTEMS:    10 Point review of Systems was done is negative except as noted above.  PHYSICAL EXAMINATION: ECOG PERFORMANCE STATUS: {CHL ONC ECOG UJ:[id number]}  .There were no vitals filed for this  visit. There were no vitals filed for this visit. .There is no height or weight on file to calculate BMI.  GENERAL:alert, in no acute distress and comfortable SKIN: no acute rashes, no significant lesions EYES: conjunctiva are pink and non-injected, sclera anicteric OROPHARYNX: MMM, no exudates, no oropharyngeal erythema or ulceration NECK: supple, no JVD LYMPH:  no palpable lymphadenopathy in the cervical, axillary or inguinal regions LUNGS: clear to auscultation b/l with normal respiratory effort HEART: regular rate & rhythm ABDOMEN:  normoactive bowel sounds , non tender, not distended. Extremity: no pedal edema PSYCH: alert & oriented x 3 with fluent speech NEURO: no focal motor/sensory deficits  LABORATORY DATA:  I have reviewed the data as listed  .    Latest Ref Rng & Units 04/19/2022    2:20 PM 04/14/2022    6:41 AM 04/13/2022    1:18 AM  CBC  WBC 4.0 - 10.5 K/uL 7.9  6.1  8.9   Hemoglobin 12.0 - 15.0 g/dL 9.5  7.7  7.4  Hematocrit 36.0 - 46.0 % 28.4  23.5  21.4   Platelets 150 - 400 K/uL 153  104  104     .    Latest Ref Rng & Units 04/19/2022    2:20 PM 04/14/2022    6:41 AM 04/13/2022    1:18 AM  CMP  Glucose 70 - 99 mg/dL 84  92  97   BUN 6 - 20 mg/dL 9  <5  <5   Creatinine 0.44 - 1.00 mg/dL 1.61  0.96  0.45   Sodium 135 - 145 mmol/L 137  137  133   Potassium 3.5 - 5.1 mmol/L 4.3  3.9  3.3   Chloride 98 - 111 mmol/L 105  105  103   CO2 22 - 32 mmol/L 22  23  20    Calcium 8.9 - 10.3 mg/dL 8.6  8.4  8.0   Total Protein 6.5 - 8.1 g/dL 7.4     Total Bilirubin 0.3 - 1.2 mg/dL 0.7     Alkaline Phos 38 - 126 U/L 89     AST 15 - 41 U/L 51     ALT 0 - 44 U/L 48        RADIOGRAPHIC STUDIES: I have personally reviewed the radiological images as listed and agreed with the findings in the report. No results found.  ASSESSMENT & PLAN:   22 y.o. female with:  Acute pulmonary embolism  PLAN:  -Discussed lab results from 04/19/2022 in detail with pateint. CBC  showed WBC of 7.9K, hemoglobin of 9.5, and platelets of 153K. -informed patient of goal to evaluate for what triggered her blood clot -discussed results of 04/13/2022 CT chest scan which did show right lung pneumonia -Genetic testing was normal -tests show natural blood thinners normal -lupus anticoagulant elevated. This may be present on its own and affect clotting proteins, it may be part of an autoimmune condition, or could be due to certain medications -educated patient that menstrual blood clots are typically not a sign of a clotting disorder -discussed option of progesterone-releasing IUDs to control menstrual cycles. Informed patient that birth control may increase the risk of blood clots -informed patient that cancers may trigger blood clots -discussed option of OB/GYN to consider an [place] to rule out any fibroid or cyst that may cause heavy menstrual cycles -continue to follow-up with OB/GYN for continued evaluation and management -Patient is UTD with Pap smears at this time -will order outpatient CT chest scan to rule out any fluid remaining in lungs -continue Eliquis -discussed option for referral to neurologist to evaluate headaches and rule out any inflammatory disorder that may increase the risk of blood clots, which patient is agreeable towards -will order blood tests to ensure vitamin deficiencies and platelets have resolved and to confirm lupus anticoagulants  FOLLOW-UP: Labs on 07/30/2022 CT chest wo contrast in 1 week Referral to Dr Barbaraann Cao for evaluation of recurrent severe rt sided headaches Phone visit with Dr [name] in 2 weeks  The total time spent in the appointment was *** minutes* .  All of the patient's questions were answered with apparent satisfaction. The patient knows to call the clinic with any problems, questions or concerns.   Wyvonnia Lora MD MS AAHIVMS [facility] Hematology/Oncology Physician [facility]  .*Total Encounter Time as defined by the  Centers for Medicare and Medicaid Services includes, in addition to the face-to-face time of a patient visit (documented in the note above) non-face-to-face time: obtaining and reviewing outside history, ordering  and reviewing medications, tests or procedures, care coordination (communications with other health care professionals or caregivers) and documentation in the medical record.    I,Mitra Faeizi,acting as a Neurosurgeon for Wyvonnia Lora, MD.,have documented all relevant documentation on the behalf of Wyvonnia Lora, MD,as directed by  Wyvonnia Lora, MD while in the presence of Wyvonnia Lora, MD.  ***

## 2022-07-30 ENCOUNTER — Telehealth: Payer: Self-pay | Admitting: Hematology

## 2022-08-01 ENCOUNTER — Inpatient Hospital Stay: Payer: Medicaid Other

## 2022-08-07 ENCOUNTER — Inpatient Hospital Stay: Payer: Medicaid Other | Attending: Hematology | Admitting: Hematology

## 2022-08-08 ENCOUNTER — Telehealth: Payer: Self-pay | Admitting: Hematology

## 2022-08-09 ENCOUNTER — Inpatient Hospital Stay: Payer: Medicaid Other

## 2022-08-09 ENCOUNTER — Ambulatory Visit (HOSPITAL_COMMUNITY): Payer: Medicaid Other

## 2022-08-12 ENCOUNTER — Telehealth: Payer: Self-pay | Admitting: Hematology

## 2022-08-13 ENCOUNTER — Inpatient Hospital Stay: Payer: Medicaid Other | Admitting: Hematology

## 2022-08-14 NOTE — Progress Notes (Signed)
This encounter was created in error - please disregard.

## 2022-08-15 ENCOUNTER — Other Ambulatory Visit: Payer: Medicaid Other

## 2022-08-15 ENCOUNTER — Inpatient Hospital Stay: Payer: Medicaid Other

## 2022-08-19 ENCOUNTER — Inpatient Hospital Stay: Payer: Medicaid Other | Admitting: Hematology

## 2022-09-17 ENCOUNTER — Telehealth: Payer: Self-pay | Admitting: Hematology

## 2022-09-17 NOTE — Telephone Encounter (Signed)
Unable to leave a message, number was out of service

## 2022-11-05 ENCOUNTER — Inpatient Hospital Stay: Payer: Medicaid Other | Attending: Hematology

## 2022-11-11 ENCOUNTER — Inpatient Hospital Stay: Payer: Medicaid Other | Admitting: Hematology

## 2023-02-21 ENCOUNTER — Encounter (HOSPITAL_COMMUNITY)
Admission: EM | Disposition: A | Discharge status: Rehabilitation Facility | Payer: Self-pay | Source: Home / Self Care | Attending: Neurology

## 2023-02-21 ENCOUNTER — Emergency Department (HOSPITAL_COMMUNITY): Payer: Medicaid Other

## 2023-02-21 ENCOUNTER — Encounter (HOSPITAL_COMMUNITY): Payer: Self-pay

## 2023-02-21 ENCOUNTER — Emergency Department (HOSPITAL_COMMUNITY): Payer: Medicaid Other | Admitting: Certified Registered"

## 2023-02-21 ENCOUNTER — Other Ambulatory Visit: Payer: Self-pay

## 2023-02-21 ENCOUNTER — Inpatient Hospital Stay (HOSPITAL_COMMUNITY)
Admission: EM | Admit: 2023-02-21 | Discharge: 2023-03-25 | DRG: 024 | Disposition: A | Discharge status: Rehabilitation Facility | Payer: Medicaid Other | Attending: Internal Medicine | Admitting: Internal Medicine

## 2023-02-21 DIAGNOSIS — B9620 Unspecified Escherichia coli [E. coli] as the cause of diseases classified elsewhere: Secondary | ICD-10-CM | POA: Diagnosis present

## 2023-02-21 DIAGNOSIS — Z833 Family history of diabetes mellitus: Secondary | ICD-10-CM

## 2023-02-21 DIAGNOSIS — I639 Cerebral infarction, unspecified: Secondary | ICD-10-CM | POA: Diagnosis not present

## 2023-02-21 DIAGNOSIS — I6602 Occlusion and stenosis of left middle cerebral artery: Secondary | ICD-10-CM | POA: Diagnosis present

## 2023-02-21 DIAGNOSIS — F17210 Nicotine dependence, cigarettes, uncomplicated: Secondary | ICD-10-CM | POA: Diagnosis not present

## 2023-02-21 DIAGNOSIS — K567 Ileus, unspecified: Secondary | ICD-10-CM | POA: Diagnosis not present

## 2023-02-21 DIAGNOSIS — D509 Iron deficiency anemia, unspecified: Secondary | ICD-10-CM | POA: Diagnosis present

## 2023-02-21 DIAGNOSIS — F17290 Nicotine dependence, other tobacco product, uncomplicated: Secondary | ICD-10-CM | POA: Diagnosis present

## 2023-02-21 DIAGNOSIS — R1310 Dysphagia, unspecified: Secondary | ICD-10-CM

## 2023-02-21 DIAGNOSIS — D6862 Lupus anticoagulant syndrome: Secondary | ICD-10-CM

## 2023-02-21 DIAGNOSIS — I63512 Cerebral infarction due to unspecified occlusion or stenosis of left middle cerebral artery: Secondary | ICD-10-CM | POA: Diagnosis present

## 2023-02-21 DIAGNOSIS — E871 Hypo-osmolality and hyponatremia: Secondary | ICD-10-CM | POA: Diagnosis not present

## 2023-02-21 DIAGNOSIS — R2990 Unspecified symptoms and signs involving the nervous system: Secondary | ICD-10-CM | POA: Diagnosis not present

## 2023-02-21 DIAGNOSIS — R29703 NIHSS score 3: Secondary | ICD-10-CM | POA: Diagnosis present

## 2023-02-21 DIAGNOSIS — Z7901 Long term (current) use of anticoagulants: Secondary | ICD-10-CM

## 2023-02-21 DIAGNOSIS — D696 Thrombocytopenia, unspecified: Secondary | ICD-10-CM | POA: Diagnosis present

## 2023-02-21 DIAGNOSIS — G8191 Hemiplegia, unspecified affecting right dominant side: Secondary | ICD-10-CM | POA: Diagnosis present

## 2023-02-21 DIAGNOSIS — E785 Hyperlipidemia, unspecified: Secondary | ICD-10-CM | POA: Diagnosis present

## 2023-02-21 DIAGNOSIS — E538 Deficiency of other specified B group vitamins: Secondary | ICD-10-CM

## 2023-02-21 DIAGNOSIS — R4701 Aphasia: Secondary | ICD-10-CM | POA: Diagnosis present

## 2023-02-21 DIAGNOSIS — Z1152 Encounter for screening for COVID-19: Secondary | ICD-10-CM

## 2023-02-21 DIAGNOSIS — N920 Excessive and frequent menstruation with regular cycle: Secondary | ICD-10-CM | POA: Diagnosis not present

## 2023-02-21 DIAGNOSIS — I69320 Aphasia following cerebral infarction: Secondary | ICD-10-CM | POA: Diagnosis not present

## 2023-02-21 DIAGNOSIS — F1110 Opioid abuse, uncomplicated: Secondary | ICD-10-CM | POA: Diagnosis present

## 2023-02-21 DIAGNOSIS — N390 Urinary tract infection, site not specified: Secondary | ICD-10-CM

## 2023-02-21 DIAGNOSIS — G9349 Other encephalopathy: Secondary | ICD-10-CM | POA: Diagnosis present

## 2023-02-21 DIAGNOSIS — D689 Coagulation defect, unspecified: Secondary | ICD-10-CM | POA: Diagnosis not present

## 2023-02-21 DIAGNOSIS — E876 Hypokalemia: Secondary | ICD-10-CM | POA: Diagnosis not present

## 2023-02-21 DIAGNOSIS — D62 Acute posthemorrhagic anemia: Secondary | ICD-10-CM | POA: Diagnosis not present

## 2023-02-21 DIAGNOSIS — Q2112 Patent foramen ovale: Secondary | ICD-10-CM | POA: Diagnosis not present

## 2023-02-21 DIAGNOSIS — A530 Latent syphilis, unspecified as early or late: Secondary | ICD-10-CM | POA: Diagnosis present

## 2023-02-21 DIAGNOSIS — Z781 Physical restraint status: Secondary | ICD-10-CM | POA: Diagnosis not present

## 2023-02-21 DIAGNOSIS — G9340 Encephalopathy, unspecified: Secondary | ICD-10-CM | POA: Diagnosis not present

## 2023-02-21 DIAGNOSIS — D72829 Elevated white blood cell count, unspecified: Secondary | ICD-10-CM | POA: Diagnosis not present

## 2023-02-21 DIAGNOSIS — D582 Other hemoglobinopathies: Secondary | ICD-10-CM | POA: Diagnosis present

## 2023-02-21 DIAGNOSIS — R109 Unspecified abdominal pain: Secondary | ICD-10-CM | POA: Diagnosis not present

## 2023-02-21 DIAGNOSIS — R7401 Elevation of levels of liver transaminase levels: Secondary | ICD-10-CM | POA: Diagnosis not present

## 2023-02-21 DIAGNOSIS — I959 Hypotension, unspecified: Secondary | ICD-10-CM | POA: Diagnosis not present

## 2023-02-21 DIAGNOSIS — Z86718 Personal history of other venous thrombosis and embolism: Secondary | ICD-10-CM

## 2023-02-21 DIAGNOSIS — R55 Syncope and collapse: Secondary | ICD-10-CM | POA: Diagnosis not present

## 2023-02-21 DIAGNOSIS — K9422 Gastrostomy infection: Secondary | ICD-10-CM | POA: Diagnosis not present

## 2023-02-21 DIAGNOSIS — K5901 Slow transit constipation: Secondary | ICD-10-CM | POA: Diagnosis not present

## 2023-02-21 DIAGNOSIS — I10 Essential (primary) hypertension: Secondary | ICD-10-CM | POA: Diagnosis present

## 2023-02-21 DIAGNOSIS — R6510 Systemic inflammatory response syndrome (SIRS) of non-infectious origin without acute organ dysfunction: Secondary | ICD-10-CM | POA: Diagnosis not present

## 2023-02-21 DIAGNOSIS — R569 Unspecified convulsions: Secondary | ICD-10-CM | POA: Diagnosis not present

## 2023-02-21 DIAGNOSIS — Z79899 Other long term (current) drug therapy: Secondary | ICD-10-CM | POA: Diagnosis not present

## 2023-02-21 DIAGNOSIS — D649 Anemia, unspecified: Secondary | ICD-10-CM | POA: Diagnosis not present

## 2023-02-21 DIAGNOSIS — R509 Fever, unspecified: Secondary | ICD-10-CM | POA: Diagnosis not present

## 2023-02-21 DIAGNOSIS — L089 Local infection of the skin and subcutaneous tissue, unspecified: Secondary | ICD-10-CM | POA: Diagnosis not present

## 2023-02-21 DIAGNOSIS — I69398 Other sequelae of cerebral infarction: Secondary | ICD-10-CM | POA: Diagnosis not present

## 2023-02-21 DIAGNOSIS — R29810 Facial weakness: Secondary | ICD-10-CM | POA: Diagnosis present

## 2023-02-21 DIAGNOSIS — R748 Abnormal levels of other serum enzymes: Secondary | ICD-10-CM | POA: Diagnosis not present

## 2023-02-21 DIAGNOSIS — Z431 Encounter for attention to gastrostomy: Secondary | ICD-10-CM | POA: Diagnosis not present

## 2023-02-21 DIAGNOSIS — R451 Restlessness and agitation: Secondary | ICD-10-CM | POA: Diagnosis not present

## 2023-02-21 DIAGNOSIS — Z86711 Personal history of pulmonary embolism: Secondary | ICD-10-CM

## 2023-02-21 DIAGNOSIS — I69351 Hemiplegia and hemiparesis following cerebral infarction affecting right dominant side: Secondary | ICD-10-CM | POA: Diagnosis not present

## 2023-02-21 DIAGNOSIS — A411 Sepsis due to other specified staphylococcus: Secondary | ICD-10-CM | POA: Diagnosis not present

## 2023-02-21 DIAGNOSIS — T85848A Pain due to other internal prosthetic devices, implants and grafts, initial encounter: Secondary | ICD-10-CM | POA: Diagnosis not present

## 2023-02-21 DIAGNOSIS — N3000 Acute cystitis without hematuria: Secondary | ICD-10-CM | POA: Diagnosis not present

## 2023-02-21 DIAGNOSIS — Z8249 Family history of ischemic heart disease and other diseases of the circulatory system: Secondary | ICD-10-CM

## 2023-02-21 HISTORY — PX: IR PERCUTANEOUS ART THROMBECTOMY/INFUSION INTRACRANIAL INC DIAG ANGIO: IMG6087

## 2023-02-21 HISTORY — PX: RADIOLOGY WITH ANESTHESIA: SHX6223

## 2023-02-21 HISTORY — DX: Cerebral infarction due to unspecified occlusion or stenosis of left middle cerebral artery: I63.512

## 2023-02-21 HISTORY — PX: IR CT HEAD LTD: IMG2386

## 2023-02-21 LAB — SARS CORONAVIRUS 2 BY RT PCR: SARS Coronavirus 2 by RT PCR: NEGATIVE

## 2023-02-21 LAB — CBC
HCT: 26.8 % — ABNORMAL LOW (ref 36.0–46.0)
Hemoglobin: 8.4 g/dL — ABNORMAL LOW (ref 12.0–15.0)
MCH: 22.3 pg — ABNORMAL LOW (ref 26.0–34.0)
MCHC: 31.3 g/dL (ref 30.0–36.0)
MCV: 71.3 fL — ABNORMAL LOW (ref 80.0–100.0)
Platelets: 45 10*3/uL — ABNORMAL LOW (ref 150–400)
RBC: 3.76 MIL/uL — ABNORMAL LOW (ref 3.87–5.11)
RDW: 21.1 % — ABNORMAL HIGH (ref 11.5–15.5)
WBC: 5.6 10*3/uL (ref 4.0–10.5)
nRBC: 0 % (ref 0.0–0.2)

## 2023-02-21 LAB — DIFFERENTIAL
Abs Immature Granulocytes: 0.01 10*3/uL (ref 0.00–0.07)
Basophils Absolute: 0 10*3/uL (ref 0.0–0.1)
Basophils Relative: 1 %
Eosinophils Absolute: 0.2 10*3/uL (ref 0.0–0.5)
Eosinophils Relative: 3 %
Immature Granulocytes: 0 %
Lymphocytes Relative: 53 %
Lymphs Abs: 3 10*3/uL (ref 0.7–4.0)
Monocytes Absolute: 0.6 10*3/uL (ref 0.1–1.0)
Monocytes Relative: 11 %
Neutro Abs: 1.8 10*3/uL (ref 1.7–7.7)
Neutrophils Relative %: 32 %

## 2023-02-21 LAB — RAPID URINE DRUG SCREEN, HOSP PERFORMED
Amphetamines: NOT DETECTED
Barbiturates: NOT DETECTED
Benzodiazepines: NOT DETECTED
Cocaine: NOT DETECTED
Opiates: NOT DETECTED
Tetrahydrocannabinol: NOT DETECTED

## 2023-02-21 LAB — COMPREHENSIVE METABOLIC PANEL
ALT: 9 U/L (ref 0–44)
AST: 20 U/L (ref 15–41)
Albumin: 3.4 g/dL — ABNORMAL LOW (ref 3.5–5.0)
Alkaline Phosphatase: 64 U/L (ref 38–126)
Anion gap: 13 (ref 5–15)
BUN: 7 mg/dL (ref 6–20)
CO2: 19 mmol/L — ABNORMAL LOW (ref 22–32)
Calcium: 8.5 mg/dL — ABNORMAL LOW (ref 8.9–10.3)
Chloride: 108 mmol/L (ref 98–111)
Creatinine, Ser: 0.84 mg/dL (ref 0.44–1.00)
GFR, Estimated: >60 mL/min (ref >60)
Glucose, Bld: 80 mg/dL (ref 70–99)
Potassium: 3.5 mmol/L (ref 3.5–5.1)
Sodium: 140 mmol/L (ref 135–145)
Total Bilirubin: 0.4 mg/dL (ref <1.2)
Total Protein: 6.9 g/dL (ref 6.5–8.1)

## 2023-02-21 LAB — APTT: aPTT: 42 s — ABNORMAL HIGH (ref 24–36)

## 2023-02-21 LAB — CBG MONITORING, ED
Glucose-Capillary: 56 mg/dL — ABNORMAL LOW (ref 70–99)
Glucose-Capillary: 75 mg/dL (ref 70–99)

## 2023-02-21 LAB — I-STAT CHEM 8, ED
BUN: 9 mg/dL (ref 6–20)
Calcium, Ion: 1.1 mmol/L — ABNORMAL LOW (ref 1.15–1.40)
Chloride: 107 mmol/L (ref 98–111)
Creatinine, Ser: 0.9 mg/dL (ref 0.44–1.00)
Glucose, Bld: 77 mg/dL (ref 70–99)
HCT: 26 % — ABNORMAL LOW (ref 36.0–46.0)
Hemoglobin: 8.8 g/dL — ABNORMAL LOW (ref 12.0–15.0)
Potassium: 3.4 mmol/L — ABNORMAL LOW (ref 3.5–5.1)
Sodium: 141 mmol/L (ref 135–145)
TCO2: 23 mmol/L (ref 22–32)

## 2023-02-21 LAB — PROTIME-INR
INR: 1.1 (ref 0.8–1.2)
Prothrombin Time: 13.9 s (ref 11.4–15.2)

## 2023-02-21 LAB — MRSA NEXT GEN BY PCR, NASAL: MRSA by PCR Next Gen: NOT DETECTED

## 2023-02-21 LAB — HCG, SERUM, QUALITATIVE: Preg, Serum: NEGATIVE

## 2023-02-21 LAB — HEMOGLOBIN A1C
Hgb A1c MFr Bld: 4.4 % — ABNORMAL LOW (ref 4.8–5.6)
Mean Plasma Glucose: 79.58 mg/dL

## 2023-02-21 LAB — ETHANOL: Alcohol, Ethyl (B): 14 mg/dL — ABNORMAL HIGH (ref <10)

## 2023-02-21 SURGERY — IR WITH ANESTHESIA
Anesthesia: General

## 2023-02-21 MED ORDER — ACETAMINOPHEN 325 MG PO TABS
650.0000 mg | ORAL_TABLET | ORAL | Status: DC | PRN
  Administered 2023-02-21 – 2023-03-24 (×18): 650 mg via ORAL
  Filled 2023-02-21 (×18): qty 2

## 2023-02-21 MED ORDER — ACETAMINOPHEN 650 MG RE SUPP: 650.0000 mg | RECTAL | Status: DC | PRN

## 2023-02-21 MED ORDER — LABETALOL HCL 5 MG/ML IV SOLN
10.0000 mg | INTRAVENOUS | Status: DC | PRN
  Administered 2023-02-26 (×2): 10 mg via INTRAVENOUS
  Filled 2023-02-21: qty 4

## 2023-02-21 MED ORDER — CLEVIDIPINE BUTYRATE 0.5 MG/ML IV EMUL: 0.0000 mg/h | INTRAVENOUS | Status: AC

## 2023-02-21 MED ORDER — ACETAMINOPHEN 160 MG/5ML PO SOLN: 650.0000 mg | ORAL | Status: DC | PRN

## 2023-02-21 MED ORDER — SUCCINYLCHOLINE CHLORIDE 200 MG/10ML IV SOSY
PREFILLED_SYRINGE | INTRAVENOUS | Status: DC | PRN
  Administered 2023-02-21: 180 mg via INTRAVENOUS

## 2023-02-21 MED ORDER — VERAPAMIL HCL 2.5 MG/ML IV SOLN
INTRAVENOUS | Status: AC
  Filled 2023-02-21: qty 2

## 2023-02-21 MED ORDER — IOHEXOL 350 MG/ML SOLN
100.0000 mL | Freq: Once | INTRAVENOUS | Status: AC | PRN
  Administered 2023-02-21: 100 mL via INTRAVENOUS

## 2023-02-21 MED ORDER — LIDOCAINE 2% (20 MG/ML) 5 ML SYRINGE
INTRAMUSCULAR | Status: DC | PRN
  Administered 2023-02-21: 40 mg via INTRAVENOUS

## 2023-02-21 MED ORDER — SODIUM CHLORIDE 0.9 % IV SOLN: INTRAVENOUS | Status: DC

## 2023-02-21 MED ORDER — SODIUM CHLORIDE 0.9 % IV SOLN: INTRAVENOUS | Status: DC | PRN

## 2023-02-21 MED ORDER — DEXAMETHASONE SODIUM PHOSPHATE 10 MG/ML IJ SOLN
INTRAMUSCULAR | Status: DC | PRN
  Administered 2023-02-21: 4 mg via INTRAVENOUS

## 2023-02-21 MED ORDER — FENTANYL CITRATE (PF) 100 MCG/2ML IJ SOLN
INTRAMUSCULAR | Status: AC
  Filled 2023-02-21: qty 2

## 2023-02-21 MED ORDER — CLEVIDIPINE BUTYRATE 0.5 MG/ML IV EMUL
INTRAVENOUS | Status: AC
  Filled 2023-02-21: qty 50

## 2023-02-21 MED ORDER — CHLORHEXIDINE GLUCONATE CLOTH 2 % EX PADS: 6.0000 | MEDICATED_PAD | Freq: Every day | CUTANEOUS | Status: DC

## 2023-02-21 MED ORDER — OXYCODONE HCL 5 MG PO TABS
5.0000 mg | ORAL_TABLET | Freq: Once | ORAL | Status: AC
  Administered 2023-02-21: 5 mg via ORAL
  Filled 2023-02-21: qty 1

## 2023-02-21 MED ORDER — SODIUM CHLORIDE 0.9% FLUSH: 3.0000 mL | Freq: Once | INTRAVENOUS | Status: DC

## 2023-02-21 MED ORDER — TICAGRELOR 90 MG PO TABS
ORAL_TABLET | ORAL | Status: AC
  Filled 2023-02-21: qty 1

## 2023-02-21 MED ORDER — VERAPAMIL HCL 2.5 MG/ML IV SOLN
INTRAVENOUS | Status: AC
Start: 2023-02-21 — End: ?
  Filled 2023-02-21: qty 2

## 2023-02-21 MED ORDER — ROCURONIUM BROMIDE 10 MG/ML (PF) SYRINGE
PREFILLED_SYRINGE | INTRAVENOUS | Status: DC | PRN
  Administered 2023-02-21: 40 mg via INTRAVENOUS
  Administered 2023-02-21: 5 mg via INTRAVENOUS

## 2023-02-21 MED ORDER — ONDANSETRON HCL 4 MG/2ML IJ SOLN
INTRAMUSCULAR | Status: DC | PRN
  Administered 2023-02-21: 4 mg via INTRAVENOUS

## 2023-02-21 MED ORDER — IOHEXOL 300 MG/ML  SOLN
150.0000 mL | Freq: Once | INTRAMUSCULAR | Status: AC | PRN
  Administered 2023-02-21: 120 mL via INTRA_ARTERIAL

## 2023-02-21 MED ORDER — FENTANYL CITRATE (PF) 100 MCG/2ML IJ SOLN
INTRAMUSCULAR | Status: AC
  Administered 2023-02-21: 100 ug
  Filled 2023-02-21: qty 2

## 2023-02-21 MED ORDER — ACETAMINOPHEN 160 MG/5ML PO SOLN
650.0000 mg | ORAL | Status: DC | PRN
  Administered 2023-02-25 – 2023-03-24 (×22): 650 mg
  Filled 2023-02-21 (×25): qty 20.3

## 2023-02-21 MED ORDER — SUGAMMADEX SODIUM 200 MG/2ML IV SOLN
INTRAVENOUS | Status: DC | PRN
  Administered 2023-02-21: 200 mg via INTRAVENOUS

## 2023-02-21 MED ORDER — FENTANYL CITRATE (PF) 100 MCG/2ML IJ SOLN
INTRAMUSCULAR | Status: DC | PRN
  Administered 2023-02-21: 100 ug via INTRAVENOUS

## 2023-02-21 MED ORDER — ACETAMINOPHEN 650 MG RE SUPP
650.0000 mg | RECTAL | Status: DC | PRN
  Administered 2023-02-23 – 2023-02-28 (×2): 650 mg via RECTAL
  Filled 2023-02-21 (×2): qty 1

## 2023-02-21 MED ORDER — NITROGLYCERIN 1 MG/10 ML FOR IR/CATH LAB
INTRA_ARTERIAL | Status: AC
  Filled 2023-02-21: qty 10

## 2023-02-21 MED ORDER — ACETAMINOPHEN 325 MG PO TABS
650.0000 mg | ORAL_TABLET | ORAL | Status: DC | PRN
  Filled 2023-02-21 (×3): qty 2

## 2023-02-21 MED ORDER — TICAGRELOR 60 MG PO TABS
ORAL_TABLET | ORAL | Status: AC | PRN
  Administered 2023-02-21: 90 mg

## 2023-02-21 MED ORDER — STROKE: EARLY STAGES OF RECOVERY BOOK
Freq: Once | Status: AC
Start: 2023-02-22 — End: 2023-02-22
  Filled 2023-02-21: qty 1

## 2023-02-21 MED ORDER — PROPOFOL 10 MG/ML IV BOLUS
INTRAVENOUS | Status: DC | PRN
  Administered 2023-02-21: 150 mg via INTRAVENOUS

## 2023-02-21 MED ORDER — VERAPAMIL HCL 2.5 MG/ML IV SOLN
INTRAVENOUS | Status: AC | PRN
  Administered 2023-02-21 (×3): 2.5 mg via INTRAVENOUS

## 2023-02-21 MED ORDER — DEXTROSE 50 % IV SOLN
1.0000 | Freq: Once | INTRAVENOUS | Status: AC
  Administered 2023-02-21: 50 mL via INTRAVENOUS

## 2023-02-21 MED ORDER — ASPIRIN 81 MG PO CHEW
CHEWABLE_TABLET | ORAL | Status: AC
Start: 2023-02-21 — End: ?
  Filled 2023-02-21: qty 1

## 2023-02-21 MED ORDER — SODIUM CHLORIDE (PF) 0.9 % IJ SOLN
INTRAVENOUS | Status: AC | PRN
  Administered 2023-02-21 (×10): 25 ug via INTRA_ARTERIAL

## 2023-02-21 MED ORDER — SENNOSIDES-DOCUSATE SODIUM 8.6-50 MG PO TABS
1.0000 | ORAL_TABLET | Freq: Every evening | ORAL | Status: DC | PRN
Start: 2023-02-21 — End: 2023-02-26
  Administered 2023-02-25: 1 via ORAL
  Filled 2023-02-21: qty 1

## 2023-02-21 MED ORDER — ASPIRIN 81 MG PO TBEC
DELAYED_RELEASE_TABLET | ORAL | Status: AC | PRN
  Administered 2023-02-21: 81 mg via ORAL

## 2023-02-21 MED ORDER — PHENYLEPHRINE HCL-NACL 20-0.9 MG/250ML-% IV SOLN
INTRAVENOUS | Status: DC | PRN
  Administered 2023-02-21: 25 ug/min via INTRAVENOUS

## 2023-02-21 MED ORDER — SODIUM CHLORIDE 0.9 % IV SOLN: INTRAVENOUS | Status: AC

## 2023-02-21 MED ORDER — OXYCODONE HCL 5 MG PO TABS
5.0000 mg | ORAL_TABLET | Freq: Once | ORAL | Status: AC | PRN
  Administered 2023-02-22: 5 mg via ORAL
  Filled 2023-02-21: qty 1

## 2023-02-21 MED ORDER — FENTANYL CITRATE PF 50 MCG/ML IJ SOSY
25.0000 ug | PREFILLED_SYRINGE | INTRAMUSCULAR | Status: DC | PRN
  Administered 2023-02-21: 50 ug via INTRAVENOUS

## 2023-02-21 NOTE — Anesthesia Procedure Notes (Signed)
Procedure Name: Intubation Date/Time: 02/21/2023 12:15 PM  Performed by: Sandie Ano, CRNAPre-anesthesia Checklist: Patient identified, Emergency Drugs available, Suction available and Patient being monitored Patient Re-evaluated:Patient Re-evaluated prior to induction Oxygen Delivery Method: Circle System Utilized Preoxygenation: Pre-oxygenation with 100% oxygen Induction Type: IV induction Ventilation: Mask ventilation without difficulty Laryngoscope Size: Mac and 3 Grade View: Grade I Tube type: Oral Tube size: 7.0 mm Number of attempts: 1 Airway Equipment and Method: Stylet and Oral airway Placement Confirmation: ETT inserted through vocal cords under direct vision, positive ETCO2 and breath sounds checked- equal and bilateral Secured at: 22 cm Tube secured with: Tape Dental Injury: Teeth and Oropharynx as per pre-operative assessment

## 2023-02-21 NOTE — ED Provider Notes (Signed)
[place] EMERGENCY DEPARTMENT AT [facility] Provider Note   CSN: [id number] Arrival date & time: 02/21/23  1040  An emergency department physician performed an initial assessment on this suspected stroke patient at 1040.  History  Chief Complaint  Patient presents with   Code Stroke    Jacqueline [name] is a 22 y.o. female.  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  Unknown last known normal likely sometime last night.  Patient's brother called EMS after talking to the patient over the phone and had concerns for changes in speech.  On EMS arrival patient has difficulty expressing herself but is able to follow commands and is moving all 4 extremities without difficulty.  The history is provided by the patient. No language interpreter was used.       Home Medications Prior to Admission medications   Medication Sig Start Date End Date Taking? Authorizing Provider  apixaban (ELIQUIS) 5 MG TABS tablet Take apixaban 10 mg (2 tabs) twice daily until [facility] Feb 14 then reduce to apixaban 5 mg (1 tab) twice daily 04/14/22   Danford, Earl Lites, MD  cyanocobalamin (VITAMIN B12) 500 MCG tablet Take 1 tablet (500 mcg total) by mouth daily. Patient not taking: Reported on 07/26/2022 04/15/22   Alberteen Sam, MD  folic acid (FOLVITE) 1 MG tablet Take 1 tablet (1 mg total) by mouth daily. Patient not taking: Reported on 07/26/2022 04/15/22   Alberteen Sam, MD  oxyCODONE-acetaminophen (PERCOCET/ROXICET) 5-325 MG tablet Take 1 tablet by mouth every 4 (four) hours as needed for moderate pain. Patient not taking: Reported on 07/26/2022 04/14/22   Alberteen Sam, MD      Allergies    Patient has no known allergies.    Review of Systems   Review of Systems  Reason unable to perform ROS:  speech difficulties.    Physical Exam Updated Vital Signs BP 100/61   Pulse (!) 109   Temp 97.8 F (36.6 C) (Temporal)   Resp 18   Ht 5\' 4"  (1.626 m)   Wt 72.6 kg   SpO2 100%   BMI 27.46 kg/m  Physical Exam Vitals and nursing note reviewed.  Constitutional:      General: She is not in acute distress.    Appearance: She is well-developed.  HENT:     Head: Normocephalic and atraumatic.  Eyes:     Conjunctiva/sclera: Conjunctivae normal.  Cardiovascular:     Rate and Rhythm: Normal rate and regular rhythm.     Heart sounds: No murmur heard. Pulmonary:     Effort: Pulmonary effort is normal. No respiratory distress.     Breath sounds: Normal breath sounds.  Abdominal:     Palpations: Abdomen is soft.     Tenderness: There is no abdominal tenderness.  Musculoskeletal:        General: No swelling.     Cervical back: Neck supple.  Skin:    General: Skin is warm and dry.     Capillary Refill: Capillary refill takes less than 2 seconds.  Neurological:     Mental Status: She is alert. She is confused.     GCS: GCS eye subscore is 4. GCS verbal subscore is 4. GCS motor subscore is 6.     Cranial Nerves: Dysarthria present. No facial asymmetry.     Sensory: Sensation is intact.  Motor: Motor function is intact.     Comments: Some slurred speech.  Some expressive aphasia.  Follows commands without difficulty.  Moves all 4 extremities.  Sensation and motor function intact.  Psychiatric:        Mood and Affect: Mood normal.     ED Results / Procedures / Treatments   Labs (all labs ordered are listed, but only abnormal results are displayed) Labs Reviewed  APTT - Abnormal; Notable for the following components:      Result Value   aPTT 42 (*)    All other components within normal limits  COMPREHENSIVE METABOLIC PANEL - Abnormal; Notable for the following components:   CO2 19 (*)    Calcium 8.5 (*)    Albumin 3.4 (*)    All other components within normal limits   ETHANOL - Abnormal; Notable for the following components:   Alcohol, Ethyl (B) 14 (*)    All other components within normal limits  I-STAT CHEM 8, ED - Abnormal; Notable for the following components:   Potassium 3.4 (*)    Calcium, Ion 1.10 (*)    Hemoglobin 8.8 (*)    HCT 26.0 (*)    All other components within normal limits  CBG MONITORING, ED - Abnormal; Notable for the following components:   Glucose-Capillary 56 (*)    All other components within normal limits  SARS CORONAVIRUS 2 BY RT PCR  PROTIME-INR  CBC  DIFFERENTIAL  HCG, SERUM, QUALITATIVE  RAPID URINE DRUG SCREEN, HOSP PERFORMED    EKG None  Radiology CT ANGIO HEAD NECK W WO CM W PERF (CODE STROKE) Result Date: 02/21/2023 CLINICAL DATA:  Neuro deficit, acute, stroke suspected EXAM: CT ANGIOGRAPHY HEAD AND NECK CT PERFUSION BRAIN TECHNIQUE: Multidetector CT imaging of the head and neck was performed using the standard protocol during bolus administration of intravenous contrast. Multiplanar CT image reconstructions and MIPs were obtained to evaluate the vascular anatomy. Carotid stenosis measurements (when applicable) are obtained utilizing NASCET criteria, using the distal internal carotid diameter as the denominator. Multiphase CT imaging of the brain was performed following IV bolus contrast injection. Subsequent parametric perfusion maps were calculated using RAPID software. RADIATION DOSE REDUCTION: This exam was performed according to the departmental dose-optimization program which includes automated exposure control, adjustment of the mA and/or kV according to patient size and/or use of iterative reconstruction technique. CONTRAST:  OMNIPAQUE IOHEXOL 350 MG/ML SOLN COMPARISON:  None Available. FINDINGS: CTA NECK FINDINGS Aortic arch: Great vessel origins are patent without significant stenosis. Right carotid system: No evidence of dissection, stenosis (50% or greater) or occlusion. Left carotid system: No evidence  of dissection, stenosis (50% or greater) or occlusion. Vertebral arteries: Left dominant. No evidence of dissection, stenosis (50% or greater) or occlusion. Skeleton: No acute abnormality on limited assessment. Other neck: No acute abnormality on limited assessment. Upper chest: Visualized lung apices are clear. Review of the MIP images confirms the above findings CTA HEAD FINDINGS Anterior circulation: Bilateral intracranial ICAs are patent without significant stenosis. Bilateral ACAs are patent without significant stenosis. Left M1 MCA is patent. 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 Posterior circulation: Bilateral intradural vertebral arteries, basilar artery and bilateral posterior cerebral arteries are patent without proximal hemodynamically significant stenosis. Venous sinuses: As permitted by contrast timing, patent. Review of the MIP images confirms the above findings CT Brain Perfusion Findings: ASPECTS: 7-8 CBF (<30%) Volume: 0mL Perfusion (Tmax>6.0s) volume: 21mL Mismatch Volume: 21mL Infarction Location:None  identified by perfusion. Dr. Selina Cooley paged at the time of dictation. IMPRESSION: 1. 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 2. Approximately 21 mL of reported mismatch/penumbra in the left parietotemporal region, which correlates with findings on same day CT head and the above occluded vessel. 3. Given the above findings, the area of possible infarct on CT head is felt to be real. Therefore, the area of mismatch/penumbra may be overestimated. An MRI could better characterize the extent of acute infarct if clinically warranted. Electronically Signed   By: Feliberto Harts M.D.   On: 02/21/2023 11:22   CT HEAD CODE STROKE WO CONTRAST Result Date: 02/21/2023 CLINICAL DATA:  Code stroke.  Neuro deficit, acute, stroke suspected EXAM: CT HEAD WITHOUT CONTRAST TECHNIQUE: Contiguous axial images were obtained from the base of the skull through the vertex without  intravenous contrast. RADIATION DOSE REDUCTION: This exam was performed according to the departmental dose-optimization program which includes automated exposure control, adjustment of the mA and/or kV according to patient size and/or use of iterative reconstruction technique. COMPARISON:  CT venogram April 19, 2022. FINDINGS: Brain: Mild loss of Maneh Sieben-Sheeley differentiation left parietotemporal region with some streak artifact in this region. No evidence of acute hemorrhage, hydrocephalus, extra-axial collection or mass lesion/mass effect. Vascular: No hyperdense vessel identified. Skull: No acute fracture. Sinuses/Orbits: Clear sinuses.  No acute orbital findings. ASPECTS [facility] Stroke Program Early CT Score) Total score (0-10 with 10 being normal): 7-8 at worst. Dr. Selina Cooley paged for call of report at the time of dictation. IMPRESSION: 1. Mild loss of Bostyn Kunkler-Burgio differentiation left parietotemporal region may represent left MCA territory acute infarct or artifact given streak artifact through this region. Recommend MRI to further evaluate. 2. No acute hemorrhage. Electronically Signed   By: Feliberto Harts M.D.   On: 02/21/2023 11:00    Procedures .Critical Care  Performed by: Franne Forts, DO Authorized by: Franne Forts, DO   Critical care provider statement:    Critical care time (minutes):  75   Critical care was necessary to treat or prevent imminent or life-threatening deterioration of the following conditions: stroke like symtpoms.   Critical care was time spent personally by me on the following activities:  Development of treatment plan with patient or surrogate, discussions with consultants, evaluation of patient's response to treatment, examination of patient, ordering and review of laboratory studies, ordering and review of radiographic studies, ordering and performing treatments and interventions, pulse oximetry, re-evaluation of patient's condition and review of old charts      Medications Ordered in ED Medications  sodium chloride flush (NS) 0.9 % injection 3 mL (3 mLs Intravenous Not Given 02/21/23 1119)  iohexol (OMNIPAQUE) 350 MG/ML injection 100 mL (100 mLs Intravenous Contrast Given 02/21/23 1100)  dextrose 50 % solution 50 mL (50 mLs Intravenous Given 02/21/23 1059)    ED Course/ Medical Decision Making/ A&P                                 Medical Decision Making Amount and/or Complexity of Data Reviewed Labs: ordered. Radiology: ordered.  Risk Prescription drug management. Decision regarding hospitalization.   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   Patient was reported to have been drinking last night and took somebody else's Suboxone.  She also had a blood sugar of 56  on arrival.  D50 given on her way to CT for stroke evaluation.  CTH demonstrates: 1. Mild loss of Burnice Oestreicher-Mayweather differentiation left parietotemporal region may represent left MCA territory acute infarct or artifact given streak artifact through this region. Recommend MRI to further evaluate. 2. No acute hemorrhage.  CTA demonstrates: 1. 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 2. Approximately 21 mL of reported mismatch/penumbra in the left parietotemporal region, which correlates with findings on same day CT head and the above occluded vessel. 3. Given the above findings, the area of possible infarct on CT head is felt to be real. Therefore, the area of mismatch/penumbra may be overestimated. An MRI could better characterize the extent of acute infarct if clinically warranted.  Patient going to IR and accepted by neurology team.  Laboratory studies stable.  No signs or symptoms of sepsis.  Patient does have anemia of 8.8 with last blood drawl on 04/19/2022 with anemia of 9.5.  Ethanol level 14.  UDS pending.   Rest of labs pending.        Final Clinical Impression(s) / ED Diagnoses Final diagnoses:  Stroke-like symptoms  Anemia, unspecified type    Rx / DC Orders ED Discharge Orders     None         Franne Forts, DO 02/21/23 1154

## 2023-02-21 NOTE — Code Documentation (Addendum)
Stroke Response Nurse Documentation Code Documentation  Jacqueline [name] is a 22 y.o. female arriving to [facility]  via [facility] on 12/20 with past medical hx of Hemoglobin C trait, thrombocytopenia, PE. On No antithrombotic currently, as she stopped taking it in June of this year. Code stroke was activated by EMS.   Patient from home where she was LKW at 0400 when she went to bed this morning after drinking a significant amount of alcohol and taking suboxone not prescribed to her.   Stroke team at the bedside on patient arrival. Labs drawn and patient cleared for CT by Dr. Wallace Cullens. Patient to CT with team. NIHSS 3, see documentation for details and code stroke times. Patient with bilateral leg weakness and Global aphasia  on exam. The following imaging was completed:  CT Head, CTA, and CTP. Patient is not a candidate for IV Thrombolytic due to being out of the treatment window. Patient is a candidate for IR due to L M2 occlusion.   Care Plan: IR.   Bedside handoff with ED RN Jacqueline Leriche.    Jacqueline [name]  Stroke Response RN

## 2023-02-21 NOTE — ED Notes (Signed)
Pt transported to IR 

## 2023-02-21 NOTE — Transfer of Care (Signed)
Immediate Anesthesia Transfer of Care Note  Patient: Jacqueline [name]  Procedure(s) Performed: IR WITH ANESTHESIA  Patient Location: PACU  Anesthesia Type:General  Level of Consciousness: awake, alert , and oriented  Airway & Oxygen Therapy: Patient Spontanous Breathing  Post-op Assessment: Report given to RN and Post -op Vital signs reviewed and stable  Post vital signs: Reviewed and stable  Last Vitals:  Vitals Value Taken Time  BP 113/75 02/21/23 1408  Temp    Pulse    Resp 18 02/21/23 1411  SpO2    Vitals shown include unfiled device data.  Last Pain:  Vitals:   02/21/23 1114  TempSrc:   PainSc: 10-Worst pain ever         Complications: No notable events documented.

## 2023-02-21 NOTE — H&P (Signed)
NEUROLOGY H&P NOTE   Date of service: February 21, 2023 Patient Name: Jacqueline [name] MRN:  [id number] DOB:  07/21/2000 Chief Complaint: "CODE STORKE"  History of Present Illness  Tynisa A Kievit is a 22 y.o. 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  Patient's brother called EMS after talking to the patient on the phone and had concerns due to the changes in her speech.   Patient admitted to drinking a significant amount of alcohol last night and she did take Suboxone that is not prescribed to her. On arrival on exam at bridge, patient had expressive and receptive aphasia with word finding difficulty.  She was able to express herself but did have significant delay and had difficulty following commands and repeating words.  She had no vision deficit, focal weakness, sensory deficit or evidence of neglect. CTA showed a left M2 MCA occlusion. Discussion was had with patient regarding vessel occlusion and procedure.  However with patient's aphasia she was not able to consent for the procedure for self.  Number located on chart for patient's mother was called and we spoke with patient's older sister who stated that she could not find her mother but that she was trying to get her on the phone.  Due to emergent nature of procedure, we discussed the procedure further with patient's sister and then with her uncle Franky Macho, who consented for the procedure.  We also spoke with patient's godmother who states that she "acts like a mother to the patient" and explained the procedure to her as well.  Risk and benefits of thrombectomy were discussed with each. Patient was previsouly on Eliquis due to PE, but states he stopped taking this in June.   Last known well: 0400 Modified rankin score: 0-Completely asymptomatic and back to  baseline post- stroke IV Thrombolysis: No, outside of window Thrombectomy: Yes, due to L M2 occlusion  NIHSS components Score: Comment  1a Level of Conscious 0[]  1[]  2[]  3[]      1b LOC Questions 0[]  1[]  2[]       1c LOC Commands 0[]  1[]  2[]       2 Best Gaze 0[]  1[]  2[]       3 Visual 0[]  1[]  2[]  3[]      4 Facial Palsy 0[]  1[]  2[]  3[]      5a Motor Arm - left 0[]  1[]  2[]  3[]  4[]  UN[]    5b Motor Arm - Right 0[]  1[]  2[]  3[]  4[]  UN[]    6a Motor Leg - Left 0[]  1[x]  2[]  3[]  4[]  UN[]    6b Motor Leg - Right 0[]  1[x]  2[]  3[]  4[]  UN[]    7 Limb Ataxia 0[]  1[]  2[]  3[]  UN[]     8 Sensory 0[]  1[]  2[]  UN[]      9 Best Language 0[]  1[x]  2[]  3[]      10 Dysarthria 0[]  1[]  2[]  UN[]      11 Extinct. and Inattention 0[]  1[]  2[]       TOTAL:   3      ROS  Comprehensive ROS performed and pertinent positives documented in the HPI  Past History   Past Medical History:  Diagnosis Date   Bacterial vaginitis 07/10/2016   Candida vaginitis 07/24/2016   Headache in pregnancy, antepartum, third trimester 07/24/2016   Kell isoimmunization during pregnancy    Past Surgical History:  Procedure Laterality Date   CESAREAN SECTION  N/A 09/04/2016   Procedure: CESAREAN SECTION;  Surgeon: Levie Heritage, DO;  Location: [facility];  Service: Obstetrics;  Laterality: N/A;   CESAREAN SECTION Bilateral    Family History  Problem Relation Age of Onset   Hypertension Maternal Grandmother    Diabetes Maternal Grandmother    Cancer Neg Hx    Social History   Socioeconomic History   Marital status: Single    Spouse name: Not on file   Number of children: Not on file   Years of education: Not on file   Highest education level: Not on file  Occupational History   Not on file  Tobacco Use   Smoking status: Some Days    Types: Cigars   Smokeless tobacco: Never   Tobacco comments:    1 B&M per day  Vaping Use   Vaping status: Never Used  Substance and Sexual Activity   Alcohol use: Yes   Drug use: No    Sexual activity: Yes    Partners: Male    Birth control/protection: None  Other Topics Concern   Not on file  Social History Narrative   [phone]- patient's confidential cell   Social Drivers of Health   Financial Resource Strain: Not on File (06/21/2021)   Received from [employer], [employer]    Financial Resource Strain: 0  Food Insecurity: Not on File (11/28/2022)   Received from [facility] Insecurity    Food: 0  Transportation Needs: No Transportation Needs (04/14/2022)   PRAPARE - Administrator, Civil Service (Medical): No    Lack of Transportation (Non-Medical): No  Physical Activity: Not on File (06/21/2021)   Received from [place], [place]   Physical Activity    Physical Activity: 0  Stress: Not on File (06/21/2021)   Received from [facility], [place]   Stress    Stress: 0  Social Connections: Not on File (11/16/2022)   Received from [facility]   Social Connections    Connectedness: 0   No Known Allergies  Medications   Current Facility-Administered Medications:    [START ON 02/22/2023]  stroke: early stages of recovery book, , Does not apply, Once, Jefferson Fuel, MD   0.9 %  sodium chloride infusion, , Intravenous, Continuous, Jefferson Fuel, MD   acetaminophen (TYLENOL) tablet 650 mg, 650 mg, Oral, Q4H PRN **OR** acetaminophen (TYLENOL) 160 MG/5ML solution 650 mg, 650 mg, Per Tube, Q4H PRN **OR** acetaminophen (TYLENOL) suppository 650 mg, 650 mg, Rectal, Q4H PRN, Jefferson Fuel, MD   iohexol (OMNIPAQUE) 300 MG/ML solution 150 mL, 150 mL, Intra-arterial, Once PRN, Deveshwar, Simonne Maffucci, MD   senna-docusate (Senokot-S) tablet 1 tablet, 1 tablet, Oral, QHS PRN, Jefferson Fuel, MD   sodium chloride flush (NS) 0.9 % injection 3 mL, 3 mL, Intravenous, Once, Franne Forts, DO  Current Outpatient Medications:    apixaban (ELIQUIS) 5 MG TABS tablet, Take apixaban 10 mg (2 tabs) twice daily until [facility] Feb 14 then reduce to apixaban 5 mg (1 tab)  twice daily, Disp: 66 tablet, Rfl: 0   cyanocobalamin (VITAMIN B12) 500 MCG tablet, Take 1 tablet (500 mcg total) by mouth daily. (Patient not taking: Reported on 07/26/2022), Disp: 30 tablet, Rfl: 1   folic acid (FOLVITE) 1 MG tablet, Take 1 tablet (1 mg total) by mouth daily. (Patient not taking: Reported on 07/26/2022), Disp: 30 tablet, Rfl: 1   oxyCODONE-acetaminophen (PERCOCET/ROXICET) 5-325 MG tablet, Take 1 tablet by mouth every 4 (  four) hours as needed for moderate pain. (Patient not taking: Reported on 07/26/2022), Disp: 30 tablet, Rfl: 0  Facility-Administered Medications Ordered in Other Encounters:    0.9 %  sodium chloride infusion, , Intravenous, Continuous PRN, Ginette Otto M, CRNA, Stopped at 02/21/23 1344   dexamethasone (DECADRON) injection, , Intravenous, Anesthesia Intra-op, Widener, Nickolas, CRNA, 4 mg at 02/21/23 1225   fentaNYL (SUBLIMAZE) injection, , Intravenous, Anesthesia Intra-op, Ginette Otto M, CRNA, 100 mcg at 02/21/23 1155   lidocaine 2% (20 mg/mL) 5 mL syringe, , Intravenous, Anesthesia Intra-op, Ginette Otto M, CRNA, 40 mg at 02/21/23 1155   ondansetron (ZOFRAN) injection, , Intravenous, Anesthesia Intra-op, Widener, Nickolas, CRNA, 4 mg at 02/21/23 1343   phenylephrine (NEO-SYNEPHRINE) 20mg /NS premix infusion, , Intravenous, Continuous PRN, Dorie Rank, CRNA, Stopped at 02/21/23 1343   propofol (DIPRIVAN) 10 mg/mL bolus/IV push, , Intravenous, Anesthesia Intra-op, Dorie Rank, CRNA, 150 mg at 02/21/23 1155   rocuronium (ZEMURON) injection, , Intravenous, Anesthesia Intra-op, Dorie Rank, CRNA, 5 mg at 02/21/23 1329   succinylcholine (ANECTINE) syringe, , Intravenous, Anesthesia Intra-op, Dorie Rank, CRNA, 180 mg at 02/21/23 1155   sugammadex sodium (BRIDION) injection, , Intravenous, Anesthesia Intra-op, Widener, Nickolas, CRNA, 200 mg at 02/21/23 1343   Vitals   Vitals:   02/21/23 1113 02/21/23 1114 02/21/23 1115 02/21/23 1130  BP:   100/61  118/78  Pulse:    (!) 109  Resp:   (!) 21 18  Temp: 97.8 F (36.6 C)     TempSrc: Temporal     SpO2:    100%  Weight:  72.6 kg    Height:  5\' 4"  (1.626 m)       Body mass index is 27.46 kg/m.  Physical Exam   Constitutional: Appears well-developed and well-nourished.  Psych: Affect appropriate to situation.  Eyes: No scleral injection.  HENT: No OP obstruction.  Head: Normocephalic.  Cardiovascular: Normal rate and regular rhythm.  Respiratory: Effort normal, non-labored breathing.  GI: Soft.  No distension. There is no tenderness.  Skin: WDI.   Neurologic Examination   Neuro: Mental Status: Patient is awake, alert, oriented to person, place, month, year, and situation. Patient has expressive and receptive aphasia  No dysarthria.  No evidence of neglect. Cranial Nerves: II: Visual Fields are full. Pupils are equal, round, and reactive to light.   III,IV, VI: EOMI without ptosis or diploplia.  V: Facial sensation is symmetric to light touch VII: Facial movement is symmetric.  VIII: hearing is intact to voice X: Uvula elevates symmetrically XI: Shoulder shrug is symmetric. XII: tongue is midline without atrophy or fasciculations.  Motor: Tone is normal. Bulk is normal. 5/5 strength was present in all four extremities.  No drift or focal weakness seen. Sensory: Sensation is symmetric to light touch in the arms and legs. Cerebellar: FNF intact bilaterally, difficult for patient to follow instructions but no overt ataxia seen.   Labs   CBC:  Recent Labs  Lab 02/21/23 1042 02/21/23 1046  WBC 5.6  --   NEUTROABS 1.8  --   HGB 8.4* 8.8*  HCT 26.8* 26.0*  MCV 71.3*  --   PLT 45*  --    Basic Metabolic Panel:  Lab Results  Component Value Date   NA 141 02/21/2023   K 3.4 (L) 02/21/2023   CO2 19 (L) 02/21/2023   GLUCOSE 77 02/21/2023   BUN 9 02/21/2023   CREATININE 0.90 02/21/2023   CALCIUM 8.5 (L) 02/21/2023  GFRNONAA >60 02/21/2023   GFRAA >60  12/06/2016   Lipid Panel: No results found for: "LDLCALC" HgbA1c:  Lab Results  Component Value Date   HGBA1C 4.8 02/01/2016   Urine Drug Screen:     Component Value Date/Time   LABOPIA NONE DETECTED 02/21/2023 1153   COCAINSCRNUR NONE DETECTED 02/21/2023 1153   LABBENZ NONE DETECTED 02/21/2023 1153   AMPHETMU NONE DETECTED 02/21/2023 1153   THCU NONE DETECTED 02/21/2023 1153   LABBARB NONE DETECTED 02/21/2023 1153    Alcohol Level     Component Value Date/Time   ETH 14 (H) 02/21/2023 1042   INR  Lab Results  Component Value Date   INR 1.1 02/21/2023   APTT  Lab Results  Component Value Date   APTT 42 (H) 02/21/2023    CT Head without contrast(Personally reviewed): -Mild loss of gray/Alms differentiation in the left parietotemporal region may represent left MCA territory acute infarct or artifact given streak artifact through this region. - No acute hemorrhage  CT angio Head and Neck with contrast(Personally reviewed): -Occluded left proximal to mid and mid to space MCA -Approximately 21 mL of reported mismatch/penumbra in the left parietotemporal region  MRI Brain(Personally reviewed): Pending  Assessment   Buna A Runkel is a 22 y.o. 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  Patient's brother called EMS after talking to the patient on the phone and had concerns due to the changes in her speech.   Patient admitted to drinking a significant amount of alcohol last night and she did take Suboxone that is not prescribed to her. On arrival on exam at bridge, patient had expressive and receptive aphasia with word finding difficulty.  She was able to express herself but did have significant delay and had difficulty following commands and repeating words.  She had no vision deficit, focal  weakness, sensory deficit or evidence of neglect.  CTA showed a left M2 MCA occlusion. Risks, benefits, and alternatives to thrombectomy were discussed with her uncle Zilda Bomberger [phone]) who gave informed consent to proceed. (Uncle was the closest blood relative we were able to locate that could provide consent. Mother was unreachable despite numerous attempts. Godmother at bedside was not legally kin.)  Patient was on Eliquis due to unprovoked PE but states she stopped taking this in June.  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 S/p Mechanical Thrombectomy achieving TICI 3 revascularization  Recommendations   - Frequent Neuro checks per IR and stroke unit protocol - MRI Brain stroke protocol - TTE - Lipid panel - Statin - will be started if LDL>70 or otherwise medically indicated - A1C - Antithrombotic - Per IR.  - DVT ppx - SCDs.  - Smoking cessation - will counsel patient - SBP goal - Blood Pressure Goal: SBP 120-160 for first 24 hours then less than 180. Per IR - Telemetry monitoring for arrhythmia - 72h - Swallow screen - will be performed prior to PO intake - Stroke education - will be given - PT/OT/SLP - Dispo: ICU  _______________________________________________________________ Pt seen by Neuro NP/APP and later by MD. Note/plan to be edited by MD as needed.    Lynnae January, DNP, AGACNP-BC [facility] Please use AMION for  contact information & EPIC for messaging.   Attending Neurohospitalist Addendum Patient seen and examined with APP/Resident. Agree with the history and physical as documented above. Agree with the plan as documented, which I helped formulate. I have edited the note above to reflect my full findings and recommendations. I have independently reviewed the chart, obtained  history, review of systems and examined the patient.I have personally reviewed pertinent head/neck/spine imaging (CT/MRI). Please feel free to call with any questions.  This is a young 22 yo woman presenting with aphasia 2/2 L MCA branch occlusion who was taken to IR for mechanical thrombectomy. Unfortunately procedure was ultimately not successful 2/2 the vessel repeatedly closing again immediately after successful recanalization. Dr. Corliss Skains the neurointerventionalist who performed the procedure paused and called both myself and Stroke attending Dr. Pearlean Brownie. We all agreed that further aggressive treatment, which would likely involve MCA stenting and long-term dual antiplatelet therapy, especially in the setting of chronic thrombocytopenia (plts 45 in ED) would put patient at high risk of intracranial hemorrhage and therefore the risks outweighed the benefit. Procedure was then ended and patient was admitted to 4N neuro ICU for close monitoring, stroke workup, and PT/OT/SLP.  I called her uncle Lakedra Discher after the procedure and updated him on all of this after the decision was made to not continue attempting to reopen the vessel. I answered his questions to the best of my knowledge and he expressed understanding. He asked me to call patient's mother Daytona Stanforth to update her as well. I was unable to reach her on multiple attempts.  This patient is critically ill and at significant risk of neurological worsening, death and care requires constant monitoring of vital signs, hemodynamics,respiratory and cardiac monitoring, neurological assessment, discussion with family, other specialists and medical decision making of high complexity. I spent 102 minutes of neurocritical care time  in the care of  this patient. This was time spent independent of any time provided by nurse practitioner or PA.  Bing Neighbors, MD [facility] [phone]  If 7pm- 7am, please page neurology on call as  listed in AMION.

## 2023-02-21 NOTE — ED Triage Notes (Signed)
Pt BIB EMS as code stroke. LSN last night, unclear time. Sx started at 0900. Pt drank liquor last night and then took suboxone that was not prescribed to her. Deficits- aphasia.

## 2023-02-21 NOTE — Anesthesia Preprocedure Evaluation (Signed)
Anesthesia Evaluation  Patient identified by MRN, date of birth, ID band Patient awake    Reviewed: Allergy & Precautions, Patient's Chart, lab work & pertinent test results, Unable to perform ROS - Chart review onlyPreop documentation limited or incomplete due to emergent nature of procedure.  Airway Mallampati: II  TM Distance: >3 FB Neck ROM: Full    Dental no notable dental hx. (+) Teeth Intact, Dental Advisory Given   Pulmonary Current Smoker, PE   Pulmonary exam normal breath sounds clear to auscultation       Cardiovascular negative cardio ROS Normal cardiovascular exam Rhythm:Regular Rate:Normal  TTE 2024  1. Left ventricular ejection fraction, by estimation, is 55 to 60%. The  left ventricle has normal function. The left ventricle has no regional  wall motion abnormalities. Left ventricular diastolic parameters were  normal.   2. Right ventricular systolic function is normal. The right ventricular  size is normal. There is normal pulmonary artery systolic pressure.   3. The mitral valve is normal in structure. No evidence of mitral valve  regurgitation. No evidence of mitral stenosis.   4. The aortic valve is normal in structure. Aortic valve regurgitation is  not visualized. No aortic stenosis is present.   5. The inferior vena cava is normal in size with greater than 50%  respiratory variability, suggesting right atrial pressure of 3 mmHg.     Neuro/Psych  Headaches CVA  negative psych ROS   GI/Hepatic negative GI ROS,,,(+)     substance abuse  marijuana use  Endo/Other  negative endocrine ROS    Renal/GU negative Renal ROS  negative genitourinary   Musculoskeletal negative musculoskeletal ROS (+)    Abdominal   Peds  Hematology  (+) Blood dyscrasia (thrombocytopenia, eliquis), anemia Lab Results      Component                Value               Date                      WBC                      5.6                  02/21/2023                HGB                      8.8 (L)             02/21/2023                HCT                      26.0 (L)            02/21/2023                MCV                      71.3 (L)            02/21/2023                PLT                      45 (L)  02/21/2023              Anesthesia Other Findings Code stroke, alert, follows commands  22 year old female with past medical history of of Kells isoimmunization in pregnancy, unprovoked pulmonary embolism no longer on blood thinners, and hemoglobin C trait presenting for expressive aphasia.   Reproductive/Obstetrics                             Anesthesia Physical Anesthesia Plan  ASA: 3  Anesthesia Plan: General   Post-op Pain Management:    Induction: Intravenous  PONV Risk Score and Plan: 2 and Midazolam, Dexamethasone and Ondansetron  Airway Management Planned: Oral ETT  Additional Equipment: Arterial line  Intra-op Plan:   Post-operative Plan: Extubation in OR and Possible Post-op intubation/ventilation  Informed Consent: I have reviewed the patients History and Physical, chart, labs and discussed the procedure including the risks, benefits and alternatives for the proposed anesthesia with the patient or authorized representative who has indicated his/her understanding and acceptance.     Dental advisory given  Plan Discussed with: CRNA  Anesthesia Plan Comments:        Anesthesia Quick Evaluation

## 2023-02-21 NOTE — Procedures (Signed)
INR.  Status post left common carotid arteriogram.  Right CFA approach.  Findings.  1.Occluded inferior division of the left middle cerebral artery in the mid M2 segment.  Status post complete revascularization of the occluded inferior division left MCA branch in the M2 segment with 1 pass with contact  aspiration, and one pass with contact aspiration and 4 mm x 40 mL solitaireX retriever device achieving a TICI  3 revascularization.  Progressive worsening of stenosis with subsequent complete closure of the left MCA inferior division. Intermittent mild improvement in cailber with  verapamil and  nitroglycerine.  Post CT no ICH . Evolving left MCA inferior  division ischemia.  8 French Angio-Seal closure device deployed for hemostasis at the right groin puncture site.  Distal pulses all present unchanged from prior to the procedure.  Extubated.  Intermittently following simple commands.  Moving all 4 spontaneously.  Fatima Sanger MD

## 2023-02-22 ENCOUNTER — Inpatient Hospital Stay (HOSPITAL_COMMUNITY): Payer: Medicaid Other

## 2023-02-22 DIAGNOSIS — I63512 Cerebral infarction due to unspecified occlusion or stenosis of left middle cerebral artery: Secondary | ICD-10-CM | POA: Diagnosis not present

## 2023-02-22 LAB — CBC WITH DIFFERENTIAL/PLATELET
Abs Immature Granulocytes: 0.02 10*3/uL (ref 0.00–0.07)
Basophils Absolute: 0 10*3/uL (ref 0.0–0.1)
Basophils Relative: 0 %
Eosinophils Absolute: 0 10*3/uL (ref 0.0–0.5)
Eosinophils Relative: 0 %
HCT: 22.1 % — ABNORMAL LOW (ref 36.0–46.0)
Hemoglobin: 7.2 g/dL — ABNORMAL LOW (ref 12.0–15.0)
Immature Granulocytes: 0 %
Lymphocytes Relative: 33 %
Lymphs Abs: 2.4 10*3/uL (ref 0.7–4.0)
MCH: 22.6 pg — ABNORMAL LOW (ref 26.0–34.0)
MCHC: 32.6 g/dL (ref 30.0–36.0)
MCV: 69.3 fL — ABNORMAL LOW (ref 80.0–100.0)
Monocytes Absolute: 0.6 10*3/uL (ref 0.1–1.0)
Monocytes Relative: 9 %
Neutro Abs: 4.2 10*3/uL (ref 1.7–7.7)
Neutrophils Relative %: 58 %
Platelets: 44 10*3/uL — ABNORMAL LOW (ref 150–400)
RBC: 3.19 MIL/uL — ABNORMAL LOW (ref 3.87–5.11)
RDW: 20.5 % — ABNORMAL HIGH (ref 11.5–15.5)
WBC: 7.3 10*3/uL (ref 4.0–10.5)
nRBC: 0 % (ref 0.0–0.2)

## 2023-02-22 LAB — LIPID PANEL
Cholesterol: 93 mg/dL (ref 0–200)
HDL: 32 mg/dL — ABNORMAL LOW (ref >40)
LDL Cholesterol: 42 mg/dL (ref 0–99)
Total CHOL/HDL Ratio: 2.9 {ratio}
Triglycerides: 97 mg/dL (ref <150)
VLDL: 19 mg/dL (ref 0–40)

## 2023-02-22 LAB — BASIC METABOLIC PANEL
Anion gap: 10 (ref 5–15)
BUN: 8 mg/dL (ref 6–20)
CO2: 18 mmol/L — ABNORMAL LOW (ref 22–32)
Calcium: 8.4 mg/dL — ABNORMAL LOW (ref 8.9–10.3)
Chloride: 110 mmol/L (ref 98–111)
Creatinine, Ser: 0.9 mg/dL (ref 0.44–1.00)
GFR, Estimated: >60 mL/min (ref >60)
Glucose, Bld: 78 mg/dL (ref 70–99)
Potassium: 3.8 mmol/L (ref 3.5–5.1)
Sodium: 138 mmol/L (ref 135–145)

## 2023-02-22 LAB — RETICULOCYTES
Immature Retic Fract: 20.7 % — ABNORMAL HIGH (ref 2.3–15.9)
RBC.: 2.95 MIL/uL — ABNORMAL LOW (ref 3.87–5.11)
Retic Count, Absolute: 37.8 10*3/uL (ref 19.0–186.0)
Retic Ct Pct: 1.3 % (ref 0.4–3.1)

## 2023-02-22 LAB — FERRITIN: Ferritin: 4 ng/mL — ABNORMAL LOW (ref 11–307)

## 2023-02-22 LAB — LACTATE DEHYDROGENASE: LDH: 171 U/L (ref 98–192)

## 2023-02-22 MED ORDER — MIDAZOLAM HCL (PF) 10 MG/2ML IJ SOLN
INTRAMUSCULAR | Status: AC
  Administered 2023-02-22: 10 mg
  Filled 2023-02-22: qty 2

## 2023-02-22 MED ORDER — CHLORHEXIDINE GLUCONATE CLOTH 2 % EX PADS
6.0000 | MEDICATED_PAD | Freq: Every day | CUTANEOUS | Status: DC
  Administered 2023-02-22 – 2023-03-14 (×21): 6 via TOPICAL

## 2023-02-22 MED ORDER — HALOPERIDOL LACTATE 5 MG/ML IJ SOLN: 5.0000 mg | Freq: Once | INTRAMUSCULAR | Status: AC

## 2023-02-22 MED ORDER — QUETIAPINE FUMARATE 25 MG PO TABS
25.0000 mg | ORAL_TABLET | Freq: Two times a day (BID) | ORAL | Status: DC
  Administered 2023-02-22 – 2023-02-23 (×3): 25 mg via ORAL
  Filled 2023-02-22 (×3): qty 1

## 2023-02-22 MED ORDER — MIDAZOLAM HCL (PF) 5 MG/ML IJ SOLN: 5.0000 mg | Freq: Once | INTRAMUSCULAR | Status: AC

## 2023-02-22 MED ORDER — TRAMADOL HCL 50 MG PO TABS
50.0000 mg | ORAL_TABLET | Freq: Four times a day (QID) | ORAL | Status: DC | PRN
  Administered 2023-02-22 – 2023-02-26 (×3): 50 mg via ORAL
  Filled 2023-02-22 (×4): qty 1

## 2023-02-22 MED ORDER — DEXMEDETOMIDINE HCL IN NACL 400 MCG/100ML IV SOLN
0.0000 ug/kg/h | INTRAVENOUS | Status: DC
  Administered 2023-02-22 – 2023-02-23 (×2): 0.7 ug/kg/h via INTRAVENOUS
  Administered 2023-02-24: 0.5 ug/kg/h via INTRAVENOUS
  Administered 2023-02-24: 0.4 ug/kg/h via INTRAVENOUS
  Filled 2023-02-22 (×3): qty 100

## 2023-02-22 MED ORDER — LORAZEPAM 2 MG/ML IJ SOLN
1.0000 mg | Freq: Once | INTRAMUSCULAR | Status: AC
  Administered 2023-02-22: 1 mg via INTRAVENOUS
  Filled 2023-02-22: qty 1

## 2023-02-22 MED ORDER — DEXMEDETOMIDINE HCL IN NACL 400 MCG/100ML IV SOLN
INTRAVENOUS | Status: AC
  Administered 2023-02-22: 0.4 ug/kg/h via INTRAVENOUS
  Filled 2023-02-22: qty 100

## 2023-02-22 MED ORDER — ORAL CARE MOUTH RINSE: 15.0000 mL | OROMUCOSAL | Status: DC | PRN

## 2023-02-22 MED ORDER — HALOPERIDOL LACTATE 5 MG/ML IJ SOLN
INTRAMUSCULAR | Status: AC
  Administered 2023-02-22: 5 mg
  Filled 2023-02-22: qty 1
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
## 2023-02-22 NOTE — Anesthesia Postprocedure Evaluation (Signed)
Anesthesia Post Note  Patient: Jacqueline [name]  Procedure(s) Performed: IR WITH ANESTHESIA     Patient location during evaluation: PACU Anesthesia Type: General Level of consciousness: awake and alert Pain management: pain level controlled Vital Signs Assessment: post-procedure vital signs reviewed and stable Respiratory status: spontaneous breathing, nonlabored ventilation, respiratory function stable and patient connected to nasal cannula oxygen Cardiovascular status: blood pressure returned to baseline and stable Postop Assessment: no apparent nausea or vomiting Anesthetic complications: no  No notable events documented.  Last Vitals:  Vitals:   02/22/23 0700 02/22/23 0800  BP: 109/75 118/79  Pulse: 77 87  Resp: (!) 21 16  Temp:    SpO2: 99% 99%    Last Pain:  Vitals:   02/22/23 0640  TempSrc:   PainSc: 6    Pain Goal:                   Kasten Leveque L Orenthal Debski

## 2023-02-22 NOTE — Plan of Care (Signed)
  Problem: Health Behavior/Discharge Planning: Goal: Goals will be collaboratively established with patient/family Outcome: Progressing   Problem: Self-Care: Goal: Ability to communicate needs accurately will improve Outcome: Progressing   Problem: Nutrition: Goal: Risk of aspiration will decrease Outcome: Progressing Goal: Dietary intake will improve Outcome: Progressing   Problem: Education: Goal: Knowledge of General Education information will improve Description: Including pain rating scale, medication(s)/side effects and non-pharmacologic comfort measures Outcome: Progressing   Problem: Health Behavior/Discharge Planning: Goal: Ability to manage health-related needs will improve Outcome: Progressing   Problem: Clinical Measurements: Goal: Ability to maintain clinical measurements within normal limits will improve Outcome: Progressing Goal: Will remain free from infection Outcome: Progressing Goal: Diagnostic test results will improve Outcome: Progressing Goal: Respiratory complications will improve Outcome: Progressing Goal: Cardiovascular complication will be avoided Outcome: Progressing   Problem: Activity: Goal: Risk for activity intolerance will decrease Outcome: Progressing   Problem: Nutrition: Goal: Adequate nutrition will be maintained Outcome: Progressing   Problem: Coping: Goal: Level of anxiety will decrease Outcome: Progressing   Problem: Elimination: Goal: Will not experience complications related to bowel motility Outcome: Progressing Goal: Will not experience complications related to urinary retention Outcome: Progressing   Problem: Pain Management: Goal: General experience of comfort will improve Outcome: Progressing   Problem: Safety: Goal: Ability to remain free from injury will improve Outcome: Progressing   Problem: Skin Integrity: Goal: Risk for impaired skin integrity will decrease Outcome: Progressing   Problem:  Activity: Goal: Ability to return to baseline activity level will improve Outcome: Progressing   Problem: Cardiovascular: Goal: Ability to achieve and maintain adequate cardiovascular perfusion will improve Outcome: Progressing Goal: Vascular access site(s) Level 0-1 will be maintained Outcome: Progressing   Problem: Health Behavior/Discharge Planning: Goal: Ability to safely manage health-related needs after discharge will improve Outcome: Progressing   Problem: Education: Goal: Knowledge of disease or condition will improve Outcome: Not Progressing Goal: Knowledge of secondary prevention will improve (MUST DOCUMENT ALL) Outcome: Not Progressing Goal: Knowledge of patient specific risk factors will improve [name] N/A or DELETE if not current risk factor) Outcome: Not Progressing   Problem: Ischemic Stroke/TIA Tissue Perfusion: Goal: Complications of ischemic stroke/TIA will be minimized Outcome: Not Progressing   Problem: Coping: Goal: Will verbalize positive feelings about self Outcome: Not Progressing Goal: Will identify appropriate support needs Outcome: Not Progressing   Problem: Health Behavior/Discharge Planning: Goal: Ability to manage health-related needs will improve Outcome: Not Progressing   Problem: Self-Care: Goal: Ability to participate in self-care as condition permits will improve Outcome: Not Progressing Goal: Verbalization of feelings and concerns over difficulty with self-care will improve Outcome: Not Progressing   Problem: Education: Goal: Understanding of CV disease, CV risk reduction, and recovery process will improve Outcome: Not Progressing Goal: Individualized Educational Video(s) Outcome: Not Progressing   Problem: Safety: Goal: Non-violent Restraint(s) Outcome: Not Progressing

## 2023-02-22 NOTE — Progress Notes (Signed)
OT Cancellation Note  Patient Details Name: NIMISHA RADICE MRN: [id number] DOB: 06/17/2000   Cancelled Treatment:    Reason Eval/Treat Not Completed: Medical issues which prohibited therapy. Pt with increased agitation and aggressive towards staff. Will hold and return as schedule allows.   Arlyce Circle M Kenta Laster Ivaan Liddy MSOT, OTR/L Acute Rehab Office: [phone] 02/22/2023, 11:06 AM
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
## 2023-02-22 NOTE — Progress Notes (Signed)
PT Cancellation Note  Patient Details Name: Jacqueline [name] MRN: [id number] DOB: [birth year]   Cancelled Treatment:    Reason Eval/Treat Not Completed: Medical issues which prohibited therapy;Other (comment) pt with ongoing issues with agitation this morning, RN staff attempting to sedate, asked PT to hold off on evaluation until pt less agitated. Will continue to follow and evaluate as appropriate.   Vickki Muff, PT, DPT   Acute Rehabilitation Department Office [phone] Secure Chat Communication Preferred    Ronnie Derby 02/22/2023, 11:07 AM

## 2023-02-22 NOTE — Progress Notes (Signed)
Referring Physician(s): Bing Neighbors, MD  Supervising Physician: Julieanne Cotton  Patient Status:  [facility] - In-pt  Chief Complaint: Left MCA thrombectomy 02/21/23 in NIR  Subjective:  Patient seen at bedside, moving around a lot in bed, difficult to redirect. Confused, somewhat combative, anxious.   Allergies: Patient has no known allergies.  Medications: Prior to Admission medications   Medication Sig Start Date End Date Taking? Authorizing Provider  acetaminophen (TYLENOL) 500 MG tablet Take 500 mg by mouth every 6 (six) hours as needed for moderate pain (pain score 4-6).   Yes [provider]  ibuprofen (ADVIL) 200 MG tablet Take 200-600 mg by mouth every 6 (six) hours as needed for moderate pain (pain score 4-6).   Yes [provider]  apixaban (ELIQUIS) 5 MG TABS tablet Take apixaban 10 mg (2 tabs) twice daily until [facility] Feb 14 then reduce to apixaban 5 mg (1 tab) twice daily Patient not taking: Reported on 02/22/2023 04/14/22   Alberteen Sam, MD     Vital Signs: BP 105/68   Pulse 81   Temp 97.6 F (36.4 C) (Axillary)   Resp 16   Ht 5\' 4"  (1.626 m)   Wt 160 lb (72.6 kg)   SpO2 98%   BMI 27.46 kg/m   Physical Exam Vitals and nursing note reviewed.  Constitutional:      Comments: Confused, anxious, aphasic  Cardiovascular:     Rate and Rhythm: Normal rate and regular rhythm.     Comments: (+) right CFA puncture site clean, dry, dressed appropriately. Soft, non pulsatile. No bleeding or drainage. Pulmonary:     Effort: Pulmonary effort is normal.     Breath sounds: Normal breath sounds.  Abdominal:     Palpations: Abdomen is soft.  Skin:    General: Skin is warm and dry.  Neurological:     Mental Status: She is alert.   Alert, awake, confused, anxious, somewhat combative. Speech is aphasic, difficulty following commands Not moving right upper or lower extremity. Moving left side spontaneously.   Imaging: CT ANGIO HEAD  NECK W WO CM W PERF (CODE STROKE) Addendum Date: 02/21/2023 ADDENDUM REPORT: 02/21/2023 12:13 ADDENDUM: Findings on the CT head and CTA were discussed with Dr. Selina Cooley via telephone at 11:59 a.m. Electronically Signed   By: Feliberto Harts M.D.   On: 02/21/2023 12:13   Result Date: 02/21/2023 CLINICAL DATA:  Neuro deficit, acute, stroke suspected EXAM: CT ANGIOGRAPHY HEAD AND NECK CT PERFUSION BRAIN TECHNIQUE: Multidetector CT imaging of the head and neck was performed using the standard protocol during bolus administration of intravenous contrast. Multiplanar CT image reconstructions and MIPs were obtained to evaluate the vascular anatomy. Carotid stenosis measurements (when applicable) are obtained utilizing NASCET criteria, using the distal internal carotid diameter as the denominator. Multiphase CT imaging of the brain was performed following IV bolus contrast injection. Subsequent parametric perfusion maps were calculated using RAPID software. RADIATION DOSE REDUCTION: This exam was performed according to the departmental dose-optimization program which includes automated exposure control, adjustment of the mA and/or kV according to patient size and/or use of iterative reconstruction technique. CONTRAST:  OMNIPAQUE IOHEXOL 350 MG/ML SOLN COMPARISON:  None Available. FINDINGS: CTA NECK FINDINGS Aortic arch: Great vessel origins are patent without significant stenosis. Right carotid system: No evidence of dissection, stenosis (50% or greater) or occlusion. Left carotid system: No evidence of dissection, stenosis (50% or greater) or occlusion. Vertebral arteries: Left dominant. No evidence of dissection, stenosis (50% or greater)  or occlusion. Skeleton: No acute abnormality on limited assessment. Other neck: No acute abnormality on limited assessment. Upper chest: Visualized lung apices are clear. Review of the MIP images confirms the above findings CTA HEAD FINDINGS Anterior circulation: Bilateral  intracranial ICAs are patent without significant stenosis. Bilateral ACAs are patent without significant stenosis. Left M1 MCA is patent. 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 Posterior circulation: Bilateral intradural vertebral arteries, basilar artery and bilateral posterior cerebral arteries are patent without proximal hemodynamically significant stenosis. Venous sinuses: As permitted by contrast timing, patent. Review of the MIP images confirms the above findings CT Brain Perfusion Findings: ASPECTS: 7-8 CBF (<30%) Volume: 0mL Perfusion (Tmax>6.0s) volume: 21mL Mismatch Volume: 21mL Infarction Location:None identified by perfusion. Dr. Selina Cooley paged at the time of dictation. IMPRESSION: 1. 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 2. Approximately 21 mL of reported mismatch/penumbra in the left parietotemporal region, which correlates with findings on same day CT head and the above occluded vessel. 3. Given the above findings, the area of possible infarct on CT head is felt to be real. Therefore, the area of mismatch/penumbra may be overestimated. An MRI could better characterize the extent of acute infarct if clinically warranted. Electronically Signed: By: Feliberto Harts M.D. On: 02/21/2023 11:22   CT HEAD CODE STROKE WO CONTRAST Result Date: 02/21/2023 CLINICAL DATA:  Code stroke.  Neuro deficit, acute, stroke suspected EXAM: CT HEAD WITHOUT CONTRAST TECHNIQUE: Contiguous axial images were obtained from the base of the skull through the vertex without intravenous contrast. RADIATION DOSE REDUCTION: This exam was performed according to the departmental dose-optimization program which includes automated exposure control, adjustment of the mA and/or kV according to patient size and/or use of iterative reconstruction technique. COMPARISON:  CT venogram April 19, 2022. FINDINGS: Brain: Mild loss of gray-Lapoint differentiation left parietotemporal region with some  streak artifact in this region. No evidence of acute hemorrhage, hydrocephalus, extra-axial collection or mass lesion/mass effect. Vascular: No hyperdense vessel identified. Skull: No acute fracture. Sinuses/Orbits: Clear sinuses.  No acute orbital findings. ASPECTS [facility] Stroke Program Early CT Score) Total score (0-10 with 10 being normal): 7-8 at worst. Dr. Selina Cooley paged for call of report at the time of dictation. IMPRESSION: 1. Mild loss of gray-Branam differentiation left parietotemporal region may represent left MCA territory acute infarct or artifact given streak artifact through this region. Recommend MRI to further evaluate. 2. No acute hemorrhage. Electronically Signed   By: Feliberto Harts M.D.   On: 02/21/2023 11:00    Labs:  CBC: Recent Labs    04/14/22 0641 04/19/22 1420 02/21/23 1042 02/21/23 1046 02/22/23 0501  WBC 6.1 7.9 5.6  --  7.3  HGB 7.7* 9.5* 8.4* 8.8* 7.2*  HCT 23.5* 28.4* 26.8* 26.0* 22.1*  PLT 104* 153 45*  --  44*    COAGS: Recent Labs    02/21/23 1042  INR 1.1  APTT 42*    BMP: Recent Labs    04/14/22 0641 04/19/22 1420 02/21/23 1042 02/21/23 1046 02/22/23 0501  NA 137 137 140 141 138  K 3.9 4.3 3.5 3.4* 3.8  CL 105 105 108 107 110  CO2 23 22 19*  --  18*  GLUCOSE 92 84 80 77 78  BUN <5* 9 7 9 8   CALCIUM 8.4* 8.6* 8.5*  --  8.4*  CREATININE 0.56 0.62 0.84 0.90 0.90  GFRNONAA >60 >60 >60  --  >60    LIVER FUNCTION TESTS: Recent Labs  04/08/22 0015 04/10/22 0148 04/19/22 1420 02/21/23 1042  BILITOT 0.3 1.0 0.7 0.4  AST 15 20 51* 20  ALT 9 12 48* 9  ALKPHOS 67 73 89 64  PROT 6.6 6.5 7.4 6.9  ALBUMIN 2.9* 2.6* 2.8* 3.4*    Assessment and Plan:  22 y/o F admitted yesterday as a code stroke s/p left MCA thrombectomy in NIR seen today for post procedure follow up. Per procedure note initial contact aspiration successful, however progressive worsening of stenosis with subsequent complete closure of the left MCA inferior division  during procedure.  Patient is aphasic, difficulty following commands, anxious appearing. Combative, throwing things around room and bit RN this morning per report. Moving left upper and lower extremities spontaneously, no movement seen on right upper or lower extremity. Right CFA puncture site unremarkable.  Plan: - Routine wound care of right CFA puncture site, may remove dressing a place bandaid today - No further NIR needs at this time, does not need outpatient follow up with [place] at this time. Further plans per primary team  NIR remains available as needed, please call with questions or concerns.  Electronically Signed:  Gwynneth Macleod PA-C 02/22/2023 1:28 PM      I spent a total of 15 Minutes at the the patient's bedside AND on the patient's hospital floor or unit, greater than 50% of which was counseling/coordinating care for left MCA thrombosis.

## 2023-02-22 NOTE — Progress Notes (Signed)
Hematology/Oncology Progress Note  Clinical Summary: 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  Interval History: -- Unfortunately patient is aphasic at time of exam and unable to provide history. -- Reportedly had been drinking heavily and using recreational Suboxone. -- Had an M2 occlusion on CTA and underwent revascularization of M2 branch which reoccluded. -- Unable to answer questions at time of our exam.  O:  Vitals:   02/23/23 1900 02/23/23 2000  BP: 115/70   Pulse: (!) 105   Resp: (!) 23   Temp:  (!) 101.3 F (38.5 C)  SpO2: 97%       Latest Ref Rng & Units 02/23/2023   12:04 PM 02/22/2023    5:01 AM 02/21/2023   10:46 AM  CMP  Glucose 70 - 99 mg/dL 657  78  77   BUN 6 - 20 mg/dL 14  8  9    Creatinine 0.44 - 1.00 mg/dL 8.46  9.62  9.52   Sodium 135 - 145 mmol/L 138  138  141   Potassium 3.5 - 5.1 mmol/L 3.5  3.8  3.4   Chloride 98 - 111 mmol/L 108  110  107   CO2 22 - 32 mmol/L 15  18    Calcium 8.9 - 10.3 mg/dL 8.8  8.4        Latest Ref Rng & Units 02/23/2023   12:04 PM 02/22/2023    5:01 AM 02/21/2023   10:46 AM  CBC  WBC 4.0 - 10.5 K/uL 15.9  7.3    Hemoglobin 12.0 - 15.0 g/dL 8.4  7.2  8.8   Hematocrit 36.0 - 46.0 % 25.6  22.1  26.0   Platelets 150 - 400 K/uL 54  44        GENERAL: Somnolent sedated young African-American female SKIN: skin color, texture, turgor are normal, no rashes or significant lesions EYES: conjunctiva are pink and non-injected, sclera clear LUNGS: clear to auscultation and percussion with normal breathing effort HEART: regular rate & rhythm and no murmurs and no lower extremity edema PSYCH: Unable to assess.  Reportedly combative per nursing staff.  Assessment/Plan:  # Coagulopathy # Acute inferior left MCA territory infarct -- Patient was last seen on our service on 04/08/2022 while admitted for pulmonary  embolism.  Unfortunately she did not follow-up and reportedly discontinued her Eliquis therapy. -- Okay to continue anticoagulation therapy as long as platelets are greater than 50.  Recommend daily CBCs to monitor closely. -- Recommend use of heparin or Lovenox given the possibility we will need to stop anticoagulation due to worsening thrombocytopenia.  # Anemia/Thrombocytopenia -- Labs today show Reppucci blood cell count 7.3, hemoglobin 7.2, MCV 69.3, platelets 44. -- Patient was folate and B12 deficient at last visit, as well as iron deficient.  Suspect very poor nutrition. -- Will reorder those studies today. -- LDH and reticulocytes are within normal limits, low suspicion for hemolysis.   Jacqueline Barns, MD Department of Hematology/Oncology [facility] Phone: [phone] Pager: [phone] Email: [name]

## 2023-02-23 ENCOUNTER — Encounter (HOSPITAL_COMMUNITY): Payer: Self-pay | Admitting: Interventional Radiology

## 2023-02-23 ENCOUNTER — Inpatient Hospital Stay (HOSPITAL_COMMUNITY): Payer: Medicaid Other

## 2023-02-23 DIAGNOSIS — I63512 Cerebral infarction due to unspecified occlusion or stenosis of left middle cerebral artery: Secondary | ICD-10-CM | POA: Diagnosis not present

## 2023-02-23 LAB — FOLATE: Folate: 9.9 ng/mL (ref >5.9)

## 2023-02-23 LAB — IMMATURE PLATELET FRACTION: Immature Platelet Fraction: 21.9 % — ABNORMAL HIGH (ref 1.2–8.6)

## 2023-02-23 LAB — CBC
HCT: 25.6 % — ABNORMAL LOW (ref 36.0–46.0)
Hemoglobin: 8.4 g/dL — ABNORMAL LOW (ref 12.0–15.0)
MCH: 23.1 pg — ABNORMAL LOW (ref 26.0–34.0)
MCHC: 32.8 g/dL (ref 30.0–36.0)
MCV: 70.5 fL — ABNORMAL LOW (ref 80.0–100.0)
Platelets: 54 10*3/uL — ABNORMAL LOW (ref 150–400)
RBC: 3.63 MIL/uL — ABNORMAL LOW (ref 3.87–5.11)
RDW: 20.2 % — ABNORMAL HIGH (ref 11.5–15.5)
WBC: 15.9 10*3/uL — ABNORMAL HIGH (ref 4.0–10.5)
nRBC: 0 % (ref 0.0–0.2)

## 2023-02-23 LAB — BASIC METABOLIC PANEL
Anion gap: 15 (ref 5–15)
BUN: 14 mg/dL (ref 6–20)
CO2: 15 mmol/L — ABNORMAL LOW (ref 22–32)
Calcium: 8.8 mg/dL — ABNORMAL LOW (ref 8.9–10.3)
Chloride: 108 mmol/L (ref 98–111)
Creatinine, Ser: 1.02 mg/dL — ABNORMAL HIGH (ref 0.44–1.00)
GFR, Estimated: >60 mL/min (ref >60)
Glucose, Bld: 100 mg/dL — ABNORMAL HIGH (ref 70–99)
Potassium: 3.5 mmol/L (ref 3.5–5.1)
Sodium: 138 mmol/L (ref 135–145)

## 2023-02-23 LAB — IRON AND TIBC
Iron: 15 ug/dL — ABNORMAL LOW (ref 28–170)
Saturation Ratios: 4 % — ABNORMAL LOW (ref 10.4–31.8)
TIBC: 374 ug/dL (ref 250–450)
UIBC: 359 ug/dL

## 2023-02-23 LAB — LACTATE DEHYDROGENASE: LDH: 191 U/L (ref 98–192)

## 2023-02-23 LAB — RETICULOCYTES
Immature Retic Fract: 25.7 % — ABNORMAL HIGH (ref 2.3–15.9)
RBC.: 3.49 MIL/uL — ABNORMAL LOW (ref 3.87–5.11)
Retic Count, Absolute: 41.5 10*3/uL (ref 19.0–186.0)
Retic Ct Pct: 1.2 % (ref 0.4–3.1)

## 2023-02-23 LAB — VITAMIN B12: Vitamin B-12: 122 pg/mL — ABNORMAL LOW (ref 180–914)

## 2023-02-23 MED ORDER — QUETIAPINE FUMARATE 25 MG PO TABS
25.0000 mg | ORAL_TABLET | Freq: Two times a day (BID) | ORAL | Status: DC | PRN
  Filled 2023-02-23: qty 1

## 2023-02-23 MED ORDER — POLYSACCHARIDE IRON COMPLEX 150 MG PO CAPS
150.0000 mg | ORAL_CAPSULE | Freq: Every day | ORAL | Status: DC
  Filled 2023-02-23 (×3): qty 1

## 2023-02-23 NOTE — Progress Notes (Addendum)
STROKE TEAM PROGRESS NOTE   BRIEF HPI [name] is a 22 y.o. 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  The night before presentation, she drank a significant amount of alcohol and took some Suboxone that was not prescribed to her.  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  She went to IR for mechanical thrombectomy, but this was unfortunately unsuccessful.  This morning, patient is noted to have continued expressive aphasia and right hemiparesis.  Patient had a significant episode of agitation this morning with combativeness requiring use of restraints and initiation of Precedex drip.  Per patient's boyfriend, she does not drink every day and does not typically use illicit drugs.  NIH on Admission 3   SIGNIFICANT HOSPITAL EVENTS 12/20-patient admitted, mechanical thrombectomy attempted but unsuccessful 12/21-significant episode of agitation requiring restraints and Precedex  INTERIM HISTORY/SUBJECTIVE Patient has had stable blood pressure overnight but was febrile to 102.2.  Will check urinalysis and chest x-ray.  She is much less agitated this morning while on low-dose Precedex, will attempt to wean off of this medication.  Will obtain hematology consult given thrombocytopenia and concern for hypercoagulable state. Obtain stat CT of the head since she did not wake up for several hours after stopping Precedex but it showed evolving left MCA infarct without any acute abnormality. OBJECTIVE  CBC    Component Value Date/Time   WBC 7.3 02/22/2023 0501   RBC 2.95 (L) 02/22/2023 0551   RBC 3.19 (L) 02/22/2023 0501   HGB 7.2 (L) 02/22/2023 0501   HGB 10.7 (L) 05/21/2017 1026   HCT 22.1 (L) 02/22/2023 0501   HCT 32.4 (L) 05/21/2017 1026   PLT 44 (L) 02/22/2023 0501   PLT 118 (L) 05/21/2017 1026   MCV 69.3 (L) 02/22/2023 0501   MCV 82 05/21/2017 1026   MCH 22.6 (L)  02/22/2023 0501   MCHC 32.6 02/22/2023 0501   RDW 20.5 (H) 02/22/2023 0501   RDW 15.3 05/21/2017 1026   LYMPHSABS 2.4 02/22/2023 0501   LYMPHSABS 2.3 02/28/2017 1618   MONOABS 0.6 02/22/2023 0501   EOSABS 0.0 02/22/2023 0501   EOSABS 0.0 02/28/2017 1618   BASOSABS 0.0 02/22/2023 0501   BASOSABS 0.0 02/28/2017 1618    BMET    Component Value Date/Time   NA 138 02/22/2023 0501   K 3.8 02/22/2023 0501   CL 110 02/22/2023 0501   CO2 18 (L) 02/22/2023 0501   GLUCOSE 78 02/22/2023 0501   BUN 8 02/22/2023 0501   CREATININE 0.90 02/22/2023 0501   CALCIUM 8.4 (L) 02/22/2023 0501   GFRNONAA >60 02/22/2023 0501    IMAGING past 24 hours MR BRAIN WO CONTRAST Result Date: 02/22/2023 CLINICAL DATA:  Provided history: Neuro deficit, acute, stroke suspected. EXAM: MRI HEAD WITHOUT CONTRAST TECHNIQUE: Multiplanar, multiecho pulse sequences of the brain and surrounding structures were obtained without intravenous contrast. COMPARISON:  CT angiogram head/neck and CT perfusion head 02/21/2023. Non-contrast head CT 02/21/2023. FINDINGS: Brain: No age advanced or lobar predominant parenchymal atrophy. Acute inferior division left MCA territory cortical/subcortical infarct centered at the left insula, left temporal lobe and left parietal lobe, spanning 9.4 x 4.1 cm in transaxial dimensions. Fairly numerous additional small scattered acute cortical/subcortical infarcts within the left frontal and parietal lobes (MCA and watershed distribution). 11 mm acute infarct within the left caudate nucleus. Small acute right frontoparietal cortical/subcortical infarcts also present. No evidence of hemorrhagic conversion.  No  midline shift. No evidence of an intracranial mass. No extra-axial fluid collection. No midline shift. Vascular: Prominent susceptibility-weighted signal loss along the course of an M2 left middle cerebral artery vessel, which may reflect slow flow or vessel occlusion. Skull and upper cervical spine:  Abnormal T1 hypointense marrow signal within the calvarium and within visualized portions of the cervical spine. Sinuses/Orbits: No mass or acute finding within the imaged orbits. Mild mucosal thickening within the left maxillary sinus. Other: Trace fluid within the left mastoid air cells. Impressions 1 and 2 will be called to the ordering clinician or representative by the Radiologist Assistant, and communication documented in the PACS or [employer]. IMPRESSION: 1. Fairly extensive acute left MCA territory and watershed territory infarcts within the left cerebral hemisphere as described. Prominent susceptibility-weighted signal loss along the course of an M2 left middle cerebral artery vessel, which may reflect slow flow within this vessel or vessel occlusion. 2. Small acute right frontoparietal cortical/subcortical infarcts also present. 3. Abnormal T1 hypointense marrow signal within the calvarium and within visualized portions of the cervical spine. While this finding can reflect a marrow infiltrative process, the most common causes include chronic anemia, smoking and obesity. Electronically Signed   By: Jackey Loge D.O.   On: 02/22/2023 14:29    Vitals:   02/23/23 0630 02/23/23 0700 02/23/23 0800 02/23/23 1000  BP: 116/67 114/69 119/67 114/66  Pulse:      Resp: (!) 23 (!) 25 (!) 27 (!) 23  Temp:   (!) 102.2 F (39 C)   TempSrc:   Axillary   SpO2:      Weight:      Height:         PHYSICAL EXAM General: Drowsy, well-nourished, well-developed patient in no acute distress Psych: Affect blunted CV: Regular rate and rhythm on monitor Respiratory:  Regular, unlabored respirations on room air  NEURO:  Patient will respond to voice and touch and intermittently follow commands today.  She is nonverbal today.  She will move her left upper and lower extremity with antigravity strength but does not move her right upper and lower extremity.  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 s/p unsuccessful attempt at mechanical thrombectomy Etiology:   likely related to hypercoagulability from positive lupus anticoagulant  code Stroke CT head loss of gray-Brindisi differentiation in left parietotemporal region, possibly representing left MCA territory infarct ASPECTS 7-8 CTA head & neck occluded left proximal to mid M2 MCA CT perfusion 21 mm penumbra in left parietotemporal region MRI acute left MCA territory infarct without hemorrhagic conversion 2D Echo pending LDL 42 HgbA1c 4.4 VTE prophylaxis -SCDs No antithrombotic prior to admission, now on No antithrombotic, plan to start DAPT tomorrow Therapy recommendations:  Pending Disposition: Pending  Episode of agitation Patient had significant episode of agitation this morning with combativeness, requiring administration of Haldol and Versed Continue Precedex IV Per patient's significant other, she does not frequently drink or use illicit substances, but concern for withdrawal still remains given alcohol and Suboxone use prior to admission  Hypertension Home meds: None Stable Blood Pressure Goal: SBP 120-160 for first 24 hours then less than 180   Hyperlipidemia Home meds: None LDL 42, goal < 70 High intensity statin not indicated as LDL below goal  Tobacco Abuse Patient smokes cigars Will discuss tobacco cessation when patient is alert and oriented  Substance Abuse Patient uses Suboxone UDS negative Will discuss substance abuse and cessation when patient is alert and oriented  Dysphagia  Patient has post-stroke dysphagia, SLP consulted    Diet   Diet regular Room service appropriate? Yes with Assist; Fluid consistency: Thin   Advance diet as tolerated  Other Stroke Risk Factors ETOH use, alcohol level 14, advised patient to drink no more than 1 drink a day   Other Active Problems History of pulmonary embolus-patient was on Eliquis but stopped taking this medication in June. 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  Hospital day # 2  Patient seen by NP with MD, MD to edit note as needed. Cortney E Ernestina Columbia , MSN, AGACNP-BC [facility] See Amion for schedule and pager information 02/23/2023 10:53 AM STROKE MD NOTE :  I have personally obtained history,examined this patient, reviewed notes, independently viewed imaging studies, participated in medical decision making and plan of care.ROS completed by me personally and pertinent positives fully documented  I have made any additions or clarifications directly to the above note. Agree with note above.  Patient was agitated yesterday requiring Precedex infusion seems a bit calmer today and hence we will discontinue it.  She seems more aphasic and with increased right hemiparesis since we obtained a CT scan which shows evolving left MCA infarct with no acute changes.  Continue cautious close neurological observation and if stable will consider transferring to the floor tomorrow.  Await hematology opinion about management of chronic thrombocytopenia with her to start anticoagulation. This patient is critically ill and at significant risk of neurological worsening, death and care requires constant monitoring of vital signs, hemodynamics,respiratory and cardiac monitoring, extensive review of multiple databases, frequent neurological assessment, discussion with family, other specialists and medical decision making of high complexity.I have made any additions or clarifications directly to the above note.This critical care time does not reflect procedure time, or teaching time or supervisory time of PA/NP/Med Resident etc but could involve care discussion time.  I spent 30 minutes of neurocritical care time  in  the care of  this patient.     Delia Heady, MD Medical Director [facility] Pager: [phone] 02/23/2023 2:05 PM   To contact Stroke Continuity provider, please refer to [email or web address]. After hours, contact General Neurology

## 2023-02-23 NOTE — Progress Notes (Signed)
NAME:  Jacqueline [name], MRN:  [id number], DOB:  01/30/2001, LOS: 2 ADMISSION DATE:  02/21/2023, CONSULTATION DATE:  02/22/2023 REFERRING MD:  Pearlean Brownie - Stroke, CHIEF COMPLAINT:  severe agitation    History of Present Illness:  22 year old woman who presented with aphasia.   She has a history of unprovoked PE and HB C. Has been off anticoagulation since 06/24.   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  She had expressive and receptive aphasia with no other deficits.  M2 occlusion on CTA. Initially successful revascularization of M2 branch which reoccluded.  Extubated post procedure and able to follow simple commands and move all limbs.   Progressively more agitated since admission. Seems to still be aphasic per RN's. Started cursing and throwing items around her room.  Trying to get up and needed to be restrained. Bit RN this morning.  According to her boyfriend this is very unusual behavior for her.  She is apparently normally very even keeled and does not easily lose her temper.  Pertinent  Medical History   Past Medical History:  Diagnosis Date   Bacterial vaginitis 07/10/2016   Candida vaginitis 07/24/2016   Headache in pregnancy, antepartum, third trimester 07/24/2016   Kell isoimmunization during pregnancy    Past Surgical History:  Procedure Laterality Date   CESAREAN SECTION N/A 09/04/2016   Procedure: CESAREAN SECTION;  Surgeon: Levie Heritage, DO;  Location: [facility];  Service: Obstetrics;  Laterality: N/A;   CESAREAN SECTION Bilateral    RADIOLOGY WITH ANESTHESIA N/A 02/21/2023   Procedure: IR WITH ANESTHESIA;  Surgeon: Julieanne Cotton, MD;  Location: [facility];  Service: Radiology;  Laterality: N/A;   Prior to Admission medications   Medication Sig Start Date End Date Taking? Authorizing Provider  acetaminophen (TYLENOL) 500 MG tablet Take 500 mg by mouth every 6 (six) hours as needed for moderate pain (pain score 4-6).   Yes [provider]  ibuprofen (ADVIL) 200 MG tablet Take 200-600 mg by mouth every 6 (six) hours as needed for moderate pain (pain score 4-6).   Yes [provider]  apixaban (ELIQUIS) 5 MG TABS tablet Take apixaban 10 mg (2 tabs) twice daily until [facility] Feb 14 then reduce to apixaban 5 mg (1 tab) twice daily Patient not taking: Reported on 02/22/2023 04/14/22   Alberteen Sam, MD     Significant Hospital Events: Including procedures, antibiotic start and stop dates in addition to other pertinent events   12/20 unsuccessful M2 revascularization.  12/21 marked agitation and combativeness requiring Versed and Precedex.  Interim History / Subjective:  Has slept for the better part of the last 24 hours and starting from Precedex.  Latter has been weaned off.  Patient is somnolent but did participate with therapy.  Clear difficulty comprehending.  Objective   Blood pressure 108/66, pulse (!) 122, temperature (!) 102.3 F (39.1 C), temperature source Axillary, resp. rate (!) 29, height 5\' 4"  (1.626 m), weight 72.6 kg, SpO2 94%, unknown if currently breastfeeding.        Intake/Output Summary (Last 24 hours) at 02/23/2023 1242 Last data filed at 02/23/2023 1200 Gross per 24 hour  Intake 432.74 ml  Output 700 ml  Net -267.26 ml   Filed Weights   02/21/23 1000 02/21/23 1114  Weight: 80.4 kg 72.6 kg   Examination: General: appear stated age.  Still sleeping HENT: No icterus Lungs: Chest clear.  No longer snoring. Cardiovascular: HS normal  Abdomen: abdomen soft Extremities:  no edema. No signs of trauma. Neuro: Will open eyes to voice.  Does not follow commands but will cooperate by mimicry.  Still quite sleepy half an hour after discontinuing Precedex. GU: clear urine in Foley.  Ancillary Tests Personally Reviewed:   Normal electrolytes.  Microcytic hypochromic anemia with hemoglobin 7.2.  Platelet count 44 (was last this low when admitted for PE).  Ferritin is low at 4 and  reticulocyte count is inappropriately low. 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 Assessment & Plan:  Agitated behavior due to delirium or provoked by aphasia.  Status post failed attempt at M2 revascularization. Prior pulmonary embolism with positive lupus anticoagulant. 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  Ideally, swallow evaluation and start enteral sedative if necessary. -Continued somnolence is concerning.  If not waking up further by mid afternoon, consider repeat CT scan as patient at risk for malignant edema. - Secondary stroke prevention per neurology. May need to defer until patient is more cooperative and able to take orals.  --Iron to replete iron stores -Thrombocytopenia previously noted but no clear diagnosis.  Hematology consultation pending.  Will need to start chemical DVT prophylaxis soon given history of prior VTE  Best Practice (right click and "Reselect all SmartList Selections" daily)   Diet/type: NPO still too sleepy. DVT prophylaxis SCD Pressure ulcer(s): N/A GI prophylaxis: N/A Lines: N/A Foley:  Yes, and it is still needed Code Status:  full code Last date of multidisciplinary goals of care discussion [pending.]  CRITICAL CARE Performed by: Lynnell Catalan   Total critical care time: 35 minutes  Critical care time was exclusive of separately billable procedures and treating other patients.  Critical care was necessary to treat or prevent imminent or life-threatening deterioration.  Critical care was time spent personally by me on the following activities: development of treatment plan with patient and/or surrogate as well as nursing, discussions with consultants, evaluation of patient's response to treatment, examination of patient, obtaining history from patient or surrogate,  ordering and performing treatments and interventions, ordering and review of laboratory studies, ordering and review of radiographic studies, pulse oximetry, re-evaluation of patient's condition and participation in multidisciplinary rounds.  Lynnell Catalan, MD [facility] ICU Physician [facility] [facility]  Pager: [phone] Mobile: [phone] After hours: [phone].

## 2023-02-23 NOTE — Evaluation (Addendum)
Occupational Therapy Evaluation Patient Details Name: Jacqueline [name] MRN: [id number] DOB: [birth year] Today's Date: 02/23/2023   History of Present Illness The pt is a 22 yo female presenting 12/20 with expressive aphasia. 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   Clinical Impression   Upon arrival, pt supine in bed and boyfriend at bedside. Pt with trunk leaning to left and neck laterally flexed to left. Pt was living with her two children (26 yo and 41 yo) and was independent. Pt currently requiring Total A +2 for ADLs and bed mobility. Pt tolerating sitting at EOB for ~10 min with initial Total A for sitting balance; able to achieve 30 sec of CGA  at EOB but fatigues quickly. Pt not following commands but did demonstrate purposeful movement with her LUE (I.e. reaching up to scratch her face). Pt opening her eyes more consistently at end of session but then becoming more frustrated. Returning to supine and placing restraints back in place. VSS throughout; max HR 127. Pt would benefit from further acute OT to facilitate safe dc. Recommend dc to post-acute rehab (>3hr) for intensive OT to optimize safety, independence with ADLs, and return to PLOF.       If plan is discharge home, recommend the following: Two people to help with walking and/or transfers;Two people to help with bathing/dressing/bathroom;Assistance with cooking/housework;Direct supervision/assist for medications management;Assistance with feeding;Direct supervision/assist for financial management;Help with stairs or ramp for entrance;Assist for transportation;Supervision due to cognitive status    Functional Status Assessment  Patient has had a recent decline in their functional status and demonstrates the ability to make significant improvements in function in a reasonable  and predictable amount of time.  Equipment Recommendations  Other (comment) (Defer to next venue)    Recommendations for Other Services       Precautions / Restrictions Precautions Precautions: Fall      Mobility Bed Mobility Overal bed mobility: Needs Assistance Bed Mobility: Supine to Sit, Sit to Supine     Supine to sit: Total assist, +2 for physical assistance Sit to supine: Total assist, +2 for physical assistance   General bed mobility comments: Total A +2 for support at trunk and LEs; use of bed pad for faciltiating hips movement.    Transfers                   General transfer comment: NT for safety      Balance Overall balance assessment: Needs assistance Sitting-balance support: Bilateral upper extremity supported, Feet supported Sitting balance-Leahy Scale: Zero Sitting balance - Comments: Achieving sitting balance with CGA for 30 sec and fatigued.                                   ADL either performed or assessed with clinical judgement   ADL Overall ADL's : Needs assistance/impaired                                       General ADL Comments: Pt requiring Total A for ADLs and bed mobility     Vision   Additional Comments: Pt keeping her eyes closed for majority of session. mainly opening R eye but opening both eyes at end of session.  Will need to further assess vision     Perception         Praxis         Pertinent Vitals/Pain       Extremity/Trunk Assessment Upper Extremity Assessment Upper Extremity Assessment: RUE deficits/detail RUE Deficits / Details: No active movement or muscle initation. No withdrawls or grimacing with pain at RUE. RUE Coordination: decreased gross motor;decreased fine motor   Lower Extremity Assessment Lower Extremity Assessment: Defer to PT evaluation   Cervical / Trunk Assessment Cervical / Trunk Assessment: Other exceptions Cervical / Trunk Exceptions: Tightness at R  side of neck and trap; tendency for lateral flexio nto R   Communication Communication Communication: Difficulty following commands/understanding;Difficulty communicating thoughts/reduced clarity of speech (Stating "no" clearly twice during session)   Cognition Arousal: Obtunded Behavior During Therapy: Flat affect (grimacing with neck movement) Overall Cognitive Status: Difficult to assess                                       General Comments  VSS throughout. Max HR 127 while sitting at EOB    Exercises     Shoulder Instructions      Home Living Family/patient expects to be discharged to:: Private residence Living Arrangements: Children (6 and 5 yo) Available Help at Discharge: Family;Available 24 hours/day (boyfriend, mom, or sister) Type of Home: Apartment Home Access: Stairs to enter [employer] of Steps: 1 flight Entrance Stairs-Rails: None (walls) Home Layout: One level     Bathroom Shower/Tub: Chief Strategy Officer: Standard     Home Equipment: Grab bars - tub/shower          Prior Functioning/Environment Prior Level of Function : Independent/Modified Independent;Driving             Mobility Comments: independent ADLs Comments: independent        OT Problem List: Decreased strength;Decreased range of motion;Decreased activity tolerance;Impaired balance (sitting and/or standing);Impaired vision/perception;Decreased coordination;Decreased cognition;Decreased safety awareness;Decreased knowledge of use of DME or AE;Decreased knowledge of precautions;Pain;Impaired UE functional use      OT Treatment/Interventions: Self-care/ADL training;Therapeutic exercise;Energy conservation;DME and/or AE instruction;Therapeutic activities;Patient/family education    OT Goals(Current goals can be found in the care plan section) Acute Rehab OT Goals Patient Stated Goal: "Be able to go home" - per boyfriend OT Goal Formulation:  With family Time For Goal Achievement: 03/09/23 Potential to Achieve Goals: Good  OT Frequency: Min 1X/week    Co-evaluation PT/OT/SLP Co-Evaluation/Treatment: Yes Reason for Co-Treatment: To address functional/ADL transfers;For patient/therapist safety   OT goals addressed during session: ADL's and self-care      AM-PAC OT "6 Clicks" Daily Activity     Outcome Measure Help from another person eating meals?: Total Help from another person taking care of personal grooming?: Total Help from another person toileting, which includes using toliet, bedpan, or urinal?: Total Help from another person bathing (including washing, rinsing, drying)?: Total Help from another person to put on and taking off regular upper body clothing?: Total Help from another person to put on and taking off regular lower body clothing?: Total 6 Click Score: 6   End of Session Nurse Communication: Mobility status  Activity Tolerance: Patient limited by lethargy Patient left: in bed;with call bell/phone within reach;with bed alarm set;with family/visitor present;with restraints reapplied  OT Visit Diagnosis: Unsteadiness on feet (R26.81);Other abnormalities of gait and mobility (R26.89);Muscle weakness (generalized) (M62.81);Hemiplegia and hemiparesis;Pain  Hemiplegia - Right/Left: Right Hemiplegia - caused by: Cerebral infarction                Time: 1004-1031 OT Time Calculation (min): 27 min Charges:  OT General Charges $OT Visit: 1 Visit OT Evaluation $OT Eval High Complexity: 1 High  Merrie Epler MSOT, OTR/L Acute Rehab Office: [phone]  Theodoro Grist Favour [place] 02/23/2023, 12:52 PM

## 2023-02-23 NOTE — Progress Notes (Signed)
Inpatient Rehab Admissions Coordinator:   Per therapy recommendations, patient was screened for CIR candidacy by Clemens Catholic, MS, CCC-SLP. At this time, Pt. is not at a level to tolerate the intensity of CIR.  Pt. may have potential to progress to becoming a potential CIR candidate, so CIR admissions team will follow and monitor for progress and participation with therapies and place consult order if Pt. appears to be an appropriate candidate. Please contact me with any questions.   Clemens Catholic, [place], [place] Admissions Coordinator  [phone] ([place]) [phone] (office)

## 2023-02-23 NOTE — Evaluation (Signed)
Physical Therapy Evaluation Patient Details Name: Jacqueline [name] MRN: [id number] DOB: 2000/12/29 Today's Date: 02/23/2023  History of Present Illness  The pt is a 22 yo female presenting 12/20 with expressive aphasia. 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   Clinical Impression  Pt in bed upon arrival of PT, agreeable to evaluation at this time. Prior to admission the pt was completely independent with all mobility, living in 2nd floor apt with 5 and 6 yo children. The pt presents today with continued lethargy despite RN weaning sedation prior to eval, pt maintaining eyes closed for most of session, does open with max stimulation. She did not follow any commands, but demos purposeful movement with LLE and LUE in session. No active movement or withdrawal to noxious stimulation notes in RUE or RLE. Pt maintaining head sidebent to Left and with L rotation, attempted manual therapy to L upper trap and neck to improve ROM. Pt with slightly increased restlessness with attempt to use gait belt for standing, however, one pt calmed and reassured, she then fell asleep before mobility could continue. Will continue to benefit from skilled PT acutely to progress functional strength, activity tolerance, stability, and safety awareness. Pt will need intensive therapies once stable for d/c to facilitate return to maximal level of independence prior to return home.       If plan is discharge home, recommend the following: Two people to help with walking and/or transfers;Two people to help with bathing/dressing/bathroom;Assistance with cooking/housework;Assistance with feeding;Direct supervision/assist for medications management;Direct supervision/assist for financial management;Assist for transportation;Help with stairs or ramp for entrance;Supervision due  to cognitive status   Can travel by private vehicle        Equipment Recommendations Other (comment) (defer to post acute)  Recommendations for Other Services  Rehab consult    Functional Status Assessment Patient has had a recent decline in their functional status and demonstrates the ability to make significant improvements in function in a reasonable and predictable amount of time.     Precautions / Restrictions Precautions Precautions: Fall Precaution Comments: in restraints Restrictions Weight Bearing Restrictions Per Provider Order: No      Mobility  Bed Mobility Overal bed mobility: Needs Assistance Bed Mobility: Supine to Sit, Sit to Supine     Supine to sit: Total assist, +2 for physical assistance Sit to supine: Total assist, +2 for physical assistance   General bed mobility comments: Total A +2 for support at trunk and LEs; use of bed pad for faciltiating hips movement.    Transfers                   General transfer comment: NT for safety, pt with slight agitation with attempt to place gait belt, stating "no" twice and getting progressively more restless. once pt calmed and reassured, she then fell asleep before mobility could continue       Modified Rankin (Stroke Patients Only) Modified Rankin (Stroke Patients Only) Pre-Morbid Rankin Score: No symptoms Modified Rankin: Severe disability     Balance Overall balance assessment: Needs assistance Sitting-balance support: Bilateral upper extremity supported, Feet supported Sitting balance-Leahy Scale: Zero Sitting balance - Comments: Achieving sitting balance with CGA for 30 sec and fatigued. Postural control: Posterior lean, Right lateral lean  Pertinent Vitals/Pain Pain Assessment Pain Assessment: Faces Pain Score: 0-No pain Faces Pain Scale: No hurt    Home Living Family/patient expects to be discharged to:: Private residence Living  Arrangements: Children (6 and 5 yo) Available Help at Discharge: Family;Available 24 hours/day (boyfriend, mom, or sister) Type of Home: Apartment Home Access: Stairs to enter Entrance Stairs-Rails: None (walls) Entrance Stairs-Number of Steps: 1 flight   Home Layout: One level Home Equipment: Grab bars - tub/shower      Prior Function Prior Level of Function : Independent/Modified Independent;Driving             Mobility Comments: independent ADLs Comments: independent     Extremity/Trunk Assessment   Upper Extremity Assessment Upper Extremity Assessment: Defer to OT evaluation RUE Deficits / Details: No active movement or muscle initation. No withdrawls or grimacing with pain at RUE. RUE Coordination: decreased gross motor;decreased fine motor    Lower Extremity Assessment Lower Extremity Assessment: RLE deficits/detail;Difficult to assess due to impaired cognition RLE Deficits / Details: no active movement, withdrawal from pain, or clonus. ROM WFL    Cervical / Trunk Assessment Cervical / Trunk Assessment: Other exceptions Cervical / Trunk Exceptions: Tightness at R side of neck and trap; tendency for lateral flexio nto R  Communication   Communication Communication: Difficulty following commands/understanding;Difficulty communicating thoughts/reduced clarity of speech (Stating "no" clearly twice during session) Following commands:  (not following any commands this session)  Cognition Arousal: Obtunded Behavior During Therapy: Flat affect (grimacing with neck movement) Overall Cognitive Status: Difficult to assess                                 General Comments: pt did not follow commands this session, stated "no" x2 when attempting to use gait belt. mostly eyes closed in session, did show purposeful movement with LLE and LUE        General Comments General comments (skin integrity, edema, etc.): VSS throughout. Max HR 127 while sitting at EOB         Assessment/Plan    PT Assessment Patient needs continued PT services  PT Problem List Decreased strength;Decreased range of motion;Decreased activity tolerance;Decreased balance;Decreased coordination;Decreased mobility;Decreased cognition;Decreased safety awareness;Impaired sensation;Impaired tone       PT Treatment Interventions DME instruction;Gait training;Functional mobility training;Therapeutic activities;Therapeutic exercise;Balance training;Neuromuscular re-education;Cognitive remediation;Patient/family education    PT Goals (Current goals can be found in the Care Plan section)  Acute Rehab PT Goals Patient Stated Goal: return to independence, to see her kids PT Goal Formulation: With patient/family Time For Goal Achievement: 03/09/23 Potential to Achieve Goals: Good    Frequency Min 1X/week     Co-evaluation PT/OT/SLP Co-Evaluation/Treatment: Yes Reason for Co-Treatment: To address functional/ADL transfers;For patient/therapist safety;Complexity of the patient's impairments (multi-system involvement);Necessary to address cognition/behavior during functional activity PT goals addressed during session: Mobility/safety with mobility;Balance OT goals addressed during session: ADL's and self-care       AM-PAC PT "6 Clicks" Mobility  Outcome Measure Help needed turning from your back to your side while in a flat bed without using bedrails?: Total Help needed moving from lying on your back to sitting on the side of a flat bed without using bedrails?: Total Help needed moving to and from a bed to a chair (including a wheelchair)?: Total Help needed standing up from a chair using your arms (e.g., wheelchair or bedside chair)?: Total Help needed to walk in hospital room?: Total Help needed climbing  3-5 steps with a railing? : Total 6 Click Score: 6    End of Session Equipment Utilized During Treatment: Gait belt Activity Tolerance: Patient limited by lethargy Patient  left: in bed;with call bell/phone within reach;with bed alarm set;with family/visitor present Nurse Communication: Mobility status PT Visit Diagnosis: Unsteadiness on feet (R26.81);Other abnormalities of gait and mobility (R26.89);Muscle weakness (generalized) (M62.81);Hemiplegia and hemiparesis Hemiplegia - Right/Left: Right Hemiplegia - dominant/non-dominant: Dominant Hemiplegia - caused by: Cerebral infarction    Time: 1004-1031 PT Time Calculation (min) (ACUTE ONLY): 27 min   Charges:   PT Evaluation $PT Eval High Complexity: 1 High   PT General Charges $$ ACUTE PT VISIT: 1 Visit         Vickki Muff, PT, DPT   Acute Rehabilitation Department Office [phone] Secure Chat Communication Preferred   Ronnie Derby 02/23/2023, 1:26 PM

## 2023-02-24 ENCOUNTER — Inpatient Hospital Stay (HOSPITAL_COMMUNITY): Payer: Medicaid Other

## 2023-02-24 DIAGNOSIS — I63512 Cerebral infarction due to unspecified occlusion or stenosis of left middle cerebral artery: Secondary | ICD-10-CM | POA: Diagnosis not present

## 2023-02-24 LAB — ECHOCARDIOGRAM COMPLETE BUBBLE STUDY
AR max vel: 2.03 cm2
AV Area VTI: 1.6 cm2
AV Area mean vel: 1.62 cm2
AV Mean grad: 4 mm[Hg]
AV Peak grad: 6.3 mm[Hg]
Ao pk vel: 1.25 m/s
Area-P 1/2: 6.77 cm2
Calc EF: 62.3 %
MV VTI: 2.71 cm2
S' Lateral: 3.1 cm
Single Plane A2C EF: 66.1 %
Single Plane A4C EF: 59.1 %

## 2023-02-24 LAB — BASIC METABOLIC PANEL
Anion gap: 10 (ref 5–15)
BUN: 13 mg/dL (ref 6–20)
CO2: 19 mmol/L — ABNORMAL LOW (ref 22–32)
Calcium: 8.5 mg/dL — ABNORMAL LOW (ref 8.9–10.3)
Chloride: 107 mmol/L (ref 98–111)
Creatinine, Ser: 0.74 mg/dL (ref 0.44–1.00)
GFR, Estimated: >60 mL/min (ref >60)
Glucose, Bld: 91 mg/dL (ref 70–99)
Potassium: 3.4 mmol/L — ABNORMAL LOW (ref 3.5–5.1)
Sodium: 136 mmol/L (ref 135–145)

## 2023-02-24 LAB — URINALYSIS, ROUTINE W REFLEX MICROSCOPIC
Bilirubin Urine: NEGATIVE
Glucose, UA: NEGATIVE mg/dL
Ketones, ur: 80 mg/dL — AB
Nitrite: POSITIVE — AB
Protein, ur: 100 mg/dL — AB
RBC / HPF: >50 RBC/hpf (ref 0–5)
Specific Gravity, Urine: 1.027 (ref 1.005–1.030)
pH: 6 (ref 5.0–8.0)

## 2023-02-24 LAB — MAGNESIUM: Magnesium: 1.9 mg/dL (ref 1.7–2.4)

## 2023-02-24 LAB — CBC
HCT: 23.6 % — ABNORMAL LOW (ref 36.0–46.0)
Hemoglobin: 7.6 g/dL — ABNORMAL LOW (ref 12.0–15.0)
MCH: 23.2 pg — ABNORMAL LOW (ref 26.0–34.0)
MCHC: 32.2 g/dL (ref 30.0–36.0)
MCV: 72.2 fL — ABNORMAL LOW (ref 80.0–100.0)
Platelets: 44 10*3/uL — ABNORMAL LOW (ref 150–400)
RBC: 3.27 MIL/uL — ABNORMAL LOW (ref 3.87–5.11)
RDW: 20.2 % — ABNORMAL HIGH (ref 11.5–15.5)
WBC: 11.5 10*3/uL — ABNORMAL HIGH (ref 4.0–10.5)
nRBC: 0 % (ref 0.0–0.2)

## 2023-02-24 LAB — PHOSPHORUS: Phosphorus: 3.3 mg/dL (ref 2.5–4.6)

## 2023-02-24 LAB — GLUCOSE, CAPILLARY
Glucose-Capillary: 119 mg/dL — ABNORMAL HIGH (ref 70–99)
Glucose-Capillary: 120 mg/dL — ABNORMAL HIGH (ref 70–99)
Glucose-Capillary: 122 mg/dL — ABNORMAL HIGH (ref 70–99)

## 2023-02-24 MED ORDER — SODIUM CHLORIDE 0.9 % IV SOLN
2.0000 g | INTRAVENOUS | Status: DC
  Administered 2023-02-24: 2 g via INTRAVENOUS
  Filled 2023-02-24: qty 20

## 2023-02-24 MED ORDER — FOLIC ACID 1 MG PO TABS
1.0000 mg | ORAL_TABLET | Freq: Every day | ORAL | Status: DC
  Administered 2023-02-24 – 2023-03-25 (×30): 1 mg
  Filled 2023-02-24 (×30): qty 1

## 2023-02-24 MED ORDER — MIDAZOLAM HCL 2 MG/2ML IJ SOLN
4.0000 mg | Freq: Once | INTRAMUSCULAR | Status: AC | PRN
  Administered 2023-02-24: 4 mg via INTRAVENOUS
  Filled 2023-02-24: qty 4

## 2023-02-24 MED ORDER — VITAMIN B-12 1000 MCG PO TABS
2000.0000 ug | ORAL_TABLET | Freq: Every day | ORAL | Status: DC
Start: 2023-02-24 — End: 2023-03-25
  Administered 2023-02-24 – 2023-03-25 (×30): 2000 ug
  Filled 2023-02-24 (×30): qty 2

## 2023-02-24 MED ORDER — QUETIAPINE FUMARATE 25 MG PO TABS
25.0000 mg | ORAL_TABLET | Freq: Two times a day (BID) | ORAL | Status: DC | PRN
  Administered 2023-02-24 – 2023-02-25 (×2): 25 mg
  Filled 2023-02-24: qty 1

## 2023-02-24 MED ORDER — PROSOURCE TF20 ENFIT COMPATIBL EN LIQD
60.0000 mL | Freq: Two times a day (BID) | ENTERAL | Status: DC
  Administered 2023-02-24 – 2023-03-24 (×56): 60 mL
  Filled 2023-02-24 (×56): qty 60

## 2023-02-24 MED ORDER — IRON SUCROSE 300 MG IVPB - SIMPLE MED
300.0000 mg | Freq: Every day | Status: DC
  Administered 2023-02-24: 300 mg via INTRAVENOUS
  Filled 2023-02-24: qty 265
  Filled 2023-02-24: qty 300

## 2023-02-24 MED ORDER — ADULT MULTIVITAMIN W/MINERALS CH
1.0000 | ORAL_TABLET | Freq: Every day | ORAL | Status: DC
  Administered 2023-02-24 – 2023-02-27 (×4): 1
  Filled 2023-02-24 (×4): qty 1

## 2023-02-24 MED ORDER — POTASSIUM CHLORIDE 20 MEQ PO PACK
40.0000 meq | PACK | Freq: Once | ORAL | Status: AC
  Administered 2023-02-24: 40 meq
  Filled 2023-02-24: qty 2

## 2023-02-24 MED ORDER — POLYSACCHARIDE IRON COMPLEX 150 MG PO CAPS
150.0000 mg | ORAL_CAPSULE | Freq: Every day | ORAL | Status: DC
Start: 2023-02-28 — End: 2023-03-25
  Administered 2023-02-28 – 2023-03-25 (×26): 150 mg
  Filled 2023-02-24 (×27): qty 1

## 2023-02-24 MED ORDER — OXYCODONE HCL 5 MG PO TABS
2.5000 mg | ORAL_TABLET | Freq: Two times a day (BID) | ORAL | Status: DC
  Administered 2023-02-24 – 2023-02-26 (×5): 2.5 mg
  Filled 2023-02-24 (×7): qty 1

## 2023-02-24 MED ORDER — OSMOLITE 1.5 CAL PO LIQD
1000.0000 mL | ORAL | Status: DC
  Administered 2023-02-24 – 2023-03-21 (×19): 1000 mL
  Filled 2023-02-24 (×4): qty 1000

## 2023-02-24 MED ORDER — CLONAZEPAM 0.5 MG PO TABS
0.5000 mg | ORAL_TABLET | Freq: Two times a day (BID) | ORAL | Status: DC
  Administered 2023-02-24 – 2023-02-28 (×9): 0.5 mg
  Filled 2023-02-24 (×9): qty 1

## 2023-02-24 MED ORDER — ASPIRIN 81 MG PO CHEW
81.0000 mg | CHEWABLE_TABLET | Freq: Every day | ORAL | Status: DC
  Administered 2023-02-24 – 2023-02-25 (×2): 81 mg
  Filled 2023-02-24 (×3): qty 1

## 2023-02-24 MED ORDER — THIAMINE MONONITRATE 100 MG PO TABS
100.0000 mg | ORAL_TABLET | Freq: Every day | ORAL | Status: DC
  Administered 2023-02-24 – 2023-03-14 (×19): 100 mg
  Filled 2023-02-24 (×19): qty 1

## 2023-02-24 MED ORDER — HALOPERIDOL LACTATE 5 MG/ML IJ SOLN
2.0000 mg | Freq: Once | INTRAMUSCULAR | Status: AC
  Administered 2023-02-24: 2 mg via INTRAVENOUS
  Filled 2023-02-24: qty 1

## 2023-02-24 MED ORDER — VALPROATE SODIUM 100 MG/ML IV SOLN
1000.0000 mg | Freq: Once | INTRAVENOUS | Status: AC
  Administered 2023-02-24: 1000 mg via INTRAVENOUS
  Filled 2023-02-24: qty 10

## 2023-02-24 MED ORDER — VALPROATE SODIUM 100 MG/ML IV SOLN
500.0000 mg | Freq: Three times a day (TID) | INTRAVENOUS | Status: DC
  Administered 2023-02-24 – 2023-02-25 (×3): 500 mg via INTRAVENOUS
  Filled 2023-02-24: qty 5
  Filled 2023-02-24 (×2): qty 500

## 2023-02-24 NOTE — Progress Notes (Signed)
Inpatient Rehab Admissions Coordinator:   Per therapy recommendations, patient was screened for CIR candidacy by Laura Staley, MS, CCC-SLP. At this time, Pt. is not yet at a level to tolerate the intensity of CIR; however,   Pt. may have potential to progress to becoming a potential CIR candidate, so CIR admissions team will follow and monitor for progress and participation with therapies and place consult order if Pt. appears to be an appropriate candidate. Please contact me with any questions.   Laura Staley, MS, CCC-SLP Rehab Admissions Coordinator  [phone] ([place]) [phone] (office)  

## 2023-02-24 NOTE — Evaluation (Signed)
Clinical/Bedside Swallow Evaluation Patient Details  Name: Jacqueline [name] MRN: [id number] Date of Birth: Dec 21, 2000  Today's Date: 02/24/2023 Time: SLP Start Time (ACUTE ONLY): 0913 SLP Stop Time (ACUTE ONLY): 0928 SLP Time Calculation (min) (ACUTE ONLY): 15 min  Past Medical History:  Past Medical History:  Diagnosis Date   Bacterial vaginitis 07/10/2016   Candida vaginitis 07/24/2016   Headache in pregnancy, antepartum, third trimester 07/24/2016   Kell isoimmunization during pregnancy    Past Surgical History:  Past Surgical History:  Procedure Laterality Date   CESAREAN SECTION N/A 09/04/2016   Procedure: CESAREAN SECTION;  Surgeon: Levie Heritage, DO;  Location: [facility];  Service: Obstetrics;  Laterality: N/A;   CESAREAN SECTION Bilateral    RADIOLOGY WITH ANESTHESIA N/A 02/21/2023   Procedure: IR WITH ANESTHESIA;  Surgeon: Julieanne Cotton, MD;  Location: [facility];  Service: Radiology;  Laterality: N/A;   HPI:  The pt is a 22 yo female presenting 12/20 with expressive aphasia. 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 MRI showed fairly extensive acute left MCA territory and watershed territory infarcts within the left cerebral hemisphere as well as small acute R frontoparietal cortical/subcortical infarcts. Pt initially passed the Yale swallow screen but become more aphasic and started to cough with PO intake. 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    Assessment / Plan / Recommendation  Clinical Impression  Pt was yelling upon SLP arrival but became significantly calmer when given ice chips, which she consumed without overt difficulty. Pt appeared to do well at first with thin liquids, but delayed coughing was observed. This was quite delayed but not observed outside of PO trials, so question correlation. After break from POs as other providers came into  the room, pt did not accept any additional POs offered. At this time, even if there are POs she could have, there would be concern for consistent, adequate nutrition as well as consistent medication administration. Discussed plan and consideration of Cortrak with PCCM and stroke teams, as this may help more consistently get medications on board but it also has the potential to contribute to agitation. As mentation allows, could consider offering ice chips after oral care with staff supervision.  SLP Visit Diagnosis: Dysphagia, unspecified (R13.10)    Aspiration Risk  Mild aspiration risk;Risk for inadequate nutrition/hydration    Diet Recommendation Ice chips PRN after oral care    Medication Administration: Via alternative means    Other  Recommendations Oral Care Recommendations: Oral care QID Caregiver Recommendations: Have oral suction available    Recommendations for follow up therapy are one component of a multi-disciplinary discharge planning process, led by the attending physician.  Recommendations may be updated based on patient status, additional functional criteria and insurance authorization.  Follow up Recommendations Acute inpatient rehab (3hours/day)      Assistance Recommended at Discharge    Functional Status Assessment Patient has had a recent decline in their functional status and demonstrates the ability to make significant improvements in function in a reasonable and predictable amount of time.  Frequency and Duration min 2x/week  2 weeks       Prognosis Prognosis for improved oropharyngeal function: Good Barriers to Reach Goals: Language deficits      Swallow Study   General HPI: The pt is a 22 yo female presenting 12/20 with expressive aphasia. Work up for left MCA territory infarct s/p unsuccessful attempt at mechanical  thrombectomy. MRI showed fairly extensive acute left MCA territory and watershed territory infarcts within the left cerebral hemisphere as  well as small acute R frontoparietal cortical/subcortical infarcts. Pt initially passed the Yale swallow screen but become more aphasic and started to cough with PO intake. 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 Type of Study: Bedside Swallow Evaluation Previous Swallow Assessment: none in chart Diet Prior to this Study: NPO Temperature Spikes Noted: Yes (102.3) Respiratory Status: Room air History of Recent Intubation: No Behavior/Cognition: Alert;Requires cueing Oral Care Completed by SLP: No Oral Cavity - Dentition: Adequate natural dentition Self-Feeding Abilities: Total assist Patient Positioning: Upright in bed Baseline Vocal Quality: Normal    Oral/Motor/Sensory Function     Ice Chips Ice chips: Within functional limits Presentation: Spoon   Thin Liquid Thin Liquid: Impaired Presentation: Straw Pharyngeal  Phase Impairments: Cough - Delayed    Nectar Thick Nectar Thick Liquid: Not tested   Honey Thick Honey Thick Liquid: Not tested   Puree Puree: Not tested (pt did not accept)   Solid     Solid: Not tested      Mahala Menghini., M.A. CCC-SLP Acute Rehabilitation Services Office [phone]  Secure chat preferred  02/24/2023,10:54 AM

## 2023-02-24 NOTE — Evaluation (Signed)
Speech Language Pathology Evaluation Patient Details Name: Jacqueline [name] MRN: [id number] DOB: [birth year] Today's Date: 02/24/2023 Time: 9562-1308 SLP Time Calculation (min) (ACUTE ONLY): 15 min  Problem List:  Patient Active Problem List   Diagnosis Date Noted   Acute ischemic left MCA stroke (HCC) 02/21/2023   Middle cerebral artery embolism, left 02/21/2023   Community acquired pneumonia of right lower lobe of lung 04/10/2022   Iron deficiency anemia 04/09/2022   Folic acid deficiency 04/09/2022   Acute pulmonary embolism (HCC) 04/08/2022   Microcytic anemia 04/08/2022   Thrombocytopenia (HCC) 04/08/2022   Hypokalemia 04/08/2022   Moderate protein malnutrition (HCC) 04/08/2022   Nexplanon insertion 08/05/2017   PPH (postpartum hemorrhage) 08/04/2017   VBAC (vaginal birth after Cesarean) 08/04/2017   Abnormal fetal position 07/09/2017   Limited prenatal care in third trimester 07/09/2017   Anemia in pregnancy 06/02/2017   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   Thrombocytopenia affecting pregnancy, antepartum (HCC) 03/06/2017   Supervision of other normal pregnancy, antepartum 02/28/2017   Biological false positive RPR test 02/28/2017   H/O cesarean section 02/28/2017   Mild tetrahydrocannabinol (THC) abuse 04/25/2016   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   Maternal varicella, non-immune 02/08/2016   Past Medical History:  Past Medical History:  Diagnosis Date   Bacterial vaginitis 07/10/2016   Candida vaginitis 07/24/2016   Headache in pregnancy, antepartum, third trimester 07/24/2016   Kell isoimmunization during pregnancy    Past Surgical History:  Past Surgical History:  Procedure Laterality Date   CESAREAN SECTION N/A 09/04/2016   Procedure: CESAREAN SECTION;  Surgeon: Levie Heritage, DO;  Location: [facility];  Service: Obstetrics;  Laterality: N/A;   CESAREAN SECTION Bilateral    RADIOLOGY WITH ANESTHESIA N/A 02/21/2023   Procedure: IR WITH  ANESTHESIA;  Surgeon: Julieanne Cotton, MD;  Location: [facility];  Service: Radiology;  Laterality: N/A;   HPI:  The pt is a 22 yo female presenting 12/20 with expressive aphasia. 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 MRI showed fairly extensive acute left MCA territory and watershed territory infarcts within the left cerebral hemisphere as well as small acute R frontoparietal cortical/subcortical infarcts. Pt initially passed the Yale swallow screen but become more aphasic and started to cough with PO intake. 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   Assessment / Plan / Recommendation Clinical Impression  Pt had limited verbal output throughout evaluation, with expressive language more limited than during initial presentation per report of significant other, MD, and RN. There is some question about behavioral component, for which MD is planning to start medication for her mood. Pt verbalized two words during session, both within appropriate context ("move" and "no") and with good intelligibility. She followed one-step command x1 and sustained attention to PO trials. Although she did not verbalize, she had an emotional response to seeing a picture of her children. Pt does seem to have a significant receptive and expressive aphasia, for which she will benefit from intensive SLP follow up.     SLP Assessment  SLP Recommendation/Assessment: Patient needs continued Speech Lanaguage Pathology Services SLP Visit Diagnosis: Aphasia (R47.01)    Recommendations for follow up therapy are one component of a multi-disciplinary discharge planning process, led by the attending physician.  Recommendations may be updated based on patient status, additional functional criteria and insurance authorization.    Follow Up Recommendations  Acute inpatient rehab (3hours/day)  Assistance Recommended  at Discharge  Frequent or constant Supervision/Assistance  Functional Status Assessment Patient has had a recent decline in their functional status and demonstrates the ability to make significant improvements in function in a reasonable and predictable amount of time.  Frequency and Duration min 2x/week  2 weeks      SLP Evaluation Cognition  Overall Cognitive Status: Difficult to assess Arousal/Alertness: Awake/alert Attention: Sustained Sustained Attention:  (sustained attention to PO trials well initially) Behaviors: Other (comment) (yelling, not agitated with SLP but has been agitated with other staff)       Comprehension  Auditory Comprehension Overall Auditory Comprehension: Impaired Yes/No Questions: Impaired Basic Biographical Questions: 0-25% accurate Commands: Impaired One Step Basic Commands: 0-24% accurate    Expression Expression Primary Mode of Expression: Verbal Verbal Expression Overall Verbal Expression: Impaired Automatic Speech:  (said two words: move and no) Level of Generative/Spontaneous Verbalization: Word Interfering Components: Other (comment) (significant other suspects a behavioral component)   Oral / Motor  Motor Speech Overall Motor Speech: Other (comment) (two words she said were very clear, but will need more assessment)            Mahala Menghini., M.A. CCC-SLP Acute Rehabilitation Services Office [phone]  Secure chat preferred  02/24/2023, 10:58 AM

## 2023-02-24 NOTE — Progress Notes (Signed)
Afternoon rounds: obtained cortrak today. Placed on low dose klonopin and oxy, now able to get PO seroquel. More subdued this afternoon. She does get agitated with any stimulation. UA+, giving single dose rocephin. Neuro and PCCM will hold until tomorrow regarding transfer out of ICU.

## 2023-02-24 NOTE — Progress Notes (Signed)
Referring Physician(s): CODE STROKE  Supervising Physician: Julieanne Cotton  Patient Status:  Jacqueline [name] - In-pt  Chief Complaint:  Left MCA thrombectomy 02/21/23 in NIR   Subjective: Patient asleep/sedated on precedex infusion. Left wrist in soft restraint.   Allergies: Patient has no known allergies.  Medications: Prior to Admission medications   Medication Sig Start Date End Date Taking? Authorizing Provider  acetaminophen (TYLENOL) 500 MG tablet Take 500 mg by mouth every 6 (six) hours as needed for moderate pain (pain score 4-6).   Yes [provider]  ibuprofen (ADVIL) 200 MG tablet Take 200-600 mg by mouth every 6 (six) hours as needed for moderate pain (pain score 4-6).   Yes [provider]  apixaban (ELIQUIS) 5 MG TABS tablet Take apixaban 10 mg (2 tabs) twice daily until [facility] Feb 14 then reduce to apixaban 5 mg (1 tab) twice daily Patient not taking: Reported on 02/22/2023 04/14/22   Alberteen Sam, MD     Vital Signs: BP (!) 122/102   Pulse (!) 155   Temp (!) 100.7 F (38.2 C) (Axillary)   Resp (!) 23   Ht 5\' 4"  (1.626 m)   Wt 160 lb (72.6 kg)   SpO2 98%   BMI 27.46 kg/m   Physical Exam Constitutional:      General: She is not in acute distress.    Appearance: She is not ill-appearing.     Comments: Asleep; sedated.   HENT:     Nose:     Comments: Cortrack.  Cardiovascular:     Rate and Rhythm: Tachycardia present.     Comments: Right groin vascular site clean, soft and dry per bedside RN. Site not viewed.  Pulmonary:     Effort: Pulmonary effort is normal.  Skin:    General: Skin is warm and dry.     Imaging: DG Abd Portable 1V Result Date: 02/24/2023 CLINICAL DATA:  Feeding tube placement EXAM: PORTABLE ABDOMEN - 1 VIEW COMPARISON:  None Available. FINDINGS: The enteric tube terminates in the stomach, with the distal tip near the expected region of the pylorus. Nonobstructive bowel-gas pattern. Moderate colonic stool  burden. The visualized bibasilar lungs clear. No acute osseous abnormality. IMPRESSION: The enteric tube terminates in the distal stomach. Electronically Signed   By: Jacob Moores M.D.   On: 02/24/2023 11:25   DG CHEST PORT 1 VIEW Result Date: 02/23/2023 CLINICAL DATA:  Fever. EXAM: PORTABLE CHEST 1 VIEW COMPARISON:  04/08/2022 FINDINGS: The lungs are clear without focal pneumonia, edema, pneumothorax or pleural effusion. The cardiopericardial silhouette is within normal limits for size. No acute bony abnormality. Telemetry leads overlie the chest. IMPRESSION: No active disease. Electronically Signed   By: Kennith Center M.D.   On: 02/23/2023 14:59   CT HEAD WO CONTRAST ( ) Result Date: 02/23/2023 CLINICAL DATA:  Right history: Stroke, follow-up. EXAM: CT HEAD WITHOUT CONTRAST TECHNIQUE: Contiguous axial images were obtained from the base of the skull through the vertex without intravenous contrast. RADIATION DOSE REDUCTION: This exam was performed according to the departmental dose-optimization program which includes automated exposure control, adjustment of the mA and/or kV according to patient size and/or use of iterative reconstruction technique. COMPARISON:  Brain MRI 02/22/2023. FINDINGS: Brain: Known fairly extensive acute MCA territory and watershed territory infarcts within the left cerebral hemisphere, not appreciably changed from the prior brain MRI of 02/22/2023. Known small acute right frontoparietal infarcts also again demonstrated. No midline shift or significant mass effect. No evidence of hemorrhagic conversion.  No extra-axial fluid collection. No evidence of an intracranial mass. Vascular: Abnormal hyperdensity of a left MCA M2 vessel. Skull: No calvarial fracture or aggressive osseous lesion. Sinuses/Orbits: No orbital mass or acute orbital finding at the imaged levels. No significant paranasal sinus disease at the imaged levels. IMPRESSION: 1. Known acute infarcts within the left  greater than right cerebral hemispheres, not appreciably changed in extent from the prior brain MRI of 02/22/2023. No midline shift or significant mass effect. No evidence of hemorrhagic conversion. 2. 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 This is at site of the vascular signal abnormality described on yesterday's MRI. Electronically Signed   By: Jackey Loge D.O.   On: 02/23/2023 13:46   MR BRAIN WO CONTRAST Result Date: 02/22/2023 CLINICAL DATA:  Provided history: Neuro deficit, acute, stroke suspected. EXAM: MRI HEAD WITHOUT CONTRAST TECHNIQUE: Multiplanar, multiecho pulse sequences of the brain and surrounding structures were obtained without intravenous contrast. COMPARISON:  CT angiogram head/neck and CT perfusion head 02/21/2023. Non-contrast head CT 02/21/2023. FINDINGS: Brain: No age advanced or lobar predominant parenchymal atrophy. Acute inferior division left MCA territory cortical/subcortical infarct centered at the left insula, left temporal lobe and left parietal lobe, spanning 9.4 x 4.1 cm in transaxial dimensions. Fairly numerous additional small scattered acute cortical/subcortical infarcts within the left frontal and parietal lobes (MCA and watershed distribution). 11 mm acute infarct within the left caudate nucleus. Small acute right frontoparietal cortical/subcortical infarcts also present. No evidence of hemorrhagic conversion.  No midline shift. No evidence of an intracranial mass. No extra-axial fluid collection. No midline shift. Vascular: Prominent susceptibility-weighted signal loss along the course of an M2 left middle cerebral artery vessel, which may reflect slow flow or vessel occlusion. Skull and upper cervical spine: Abnormal T1 hypointense marrow signal within the calvarium and within visualized portions of the cervical spine. Sinuses/Orbits: No mass or acute finding within the imaged orbits. Mild mucosal  thickening within the left maxillary sinus. Other: Trace fluid within the left mastoid air cells. Impressions 1 and 2 will be called to the ordering clinician or representative by the Radiologist Assistant, and communication documented in the PACS or [employer]. IMPRESSION: 1. Fairly extensive acute left MCA territory and watershed territory infarcts within the left cerebral hemisphere as described. Prominent susceptibility-weighted signal loss along the course of an M2 left middle cerebral artery vessel, which may reflect slow flow within this vessel or vessel occlusion. 2. Small acute right frontoparietal cortical/subcortical infarcts also present. 3. Abnormal T1 hypointense marrow signal within the calvarium and within visualized portions of the cervical spine. While this finding can reflect a marrow infiltrative process, the most common causes include chronic anemia, smoking and obesity. Electronically Signed   By: Jackey Loge D.O.   On: 02/22/2023 14:29   CT ANGIO HEAD NECK W WO CM W PERF (CODE STROKE) Addendum Date: 02/21/2023 ADDENDUM REPORT: 02/21/2023 12:13 ADDENDUM: Findings on the CT head and CTA were discussed with Dr. Selina Cooley via telephone at 11:59 a.m. Electronically Signed   By: Feliberto Harts M.D.   On: 02/21/2023 12:13   Result Date: 02/21/2023 CLINICAL DATA:  Neuro deficit, acute, stroke suspected EXAM: CT ANGIOGRAPHY HEAD AND NECK CT PERFUSION BRAIN TECHNIQUE: Multidetector CT imaging of the head and neck was performed using the standard protocol during bolus administration of intravenous contrast. Multiplanar CT image reconstructions and MIPs were obtained to evaluate the vascular anatomy. Carotid stenosis measurements (when applicable) are obtained utilizing NASCET criteria, using  the distal internal carotid diameter as the denominator. Multiphase CT imaging of the brain was performed following IV bolus contrast injection. Subsequent parametric perfusion maps were calculated using  RAPID software. RADIATION DOSE REDUCTION: This exam was performed according to the departmental dose-optimization program which includes automated exposure control, adjustment of the mA and/or kV according to patient size and/or use of iterative reconstruction technique. CONTRAST:  OMNIPAQUE IOHEXOL 350 MG/ML SOLN COMPARISON:  None Available. FINDINGS: CTA NECK FINDINGS Aortic arch: Great vessel origins are patent without significant stenosis. Right carotid system: No evidence of dissection, stenosis (50% or greater) or occlusion. Left carotid system: No evidence of dissection, stenosis (50% or greater) or occlusion. Vertebral arteries: Left dominant. No evidence of dissection, stenosis (50% or greater) or occlusion. Skeleton: No acute abnormality on limited assessment. Other neck: No acute abnormality on limited assessment. Upper chest: Visualized lung apices are clear. Review of the MIP images confirms the above findings CTA HEAD FINDINGS Anterior circulation: Bilateral intracranial ICAs are patent without significant stenosis. Bilateral ACAs are patent without significant stenosis. Left M1 MCA is patent. 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 Posterior circulation: Bilateral intradural vertebral arteries, basilar artery and bilateral posterior cerebral arteries are patent without proximal hemodynamically significant stenosis. Venous sinuses: As permitted by contrast timing, patent. Review of the MIP images confirms the above findings CT Brain Perfusion Findings: ASPECTS: 7-8 CBF (<30%) Volume: 0mL Perfusion (Tmax>6.0s) volume: 21mL Mismatch Volume: 21mL Infarction Location:None identified by perfusion. Dr. Selina Cooley paged at the time of dictation. IMPRESSION: 1. 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 2. Approximately 21 mL of reported mismatch/penumbra in the left parietotemporal region, which correlates with findings on same day CT head and the above occluded vessel. 3.  Given the above findings, the area of possible infarct on CT head is felt to be real. Therefore, the area of mismatch/penumbra may be overestimated. An MRI could better characterize the extent of acute infarct if clinically warranted. Electronically Signed: By: Feliberto Harts M.D. On: 02/21/2023 11:22   CT HEAD CODE STROKE WO CONTRAST Result Date: 02/21/2023 CLINICAL DATA:  Code stroke.  Neuro deficit, acute, stroke suspected EXAM: CT HEAD WITHOUT CONTRAST TECHNIQUE: Contiguous axial images were obtained from the base of the skull through the vertex without intravenous contrast. RADIATION DOSE REDUCTION: This exam was performed according to the departmental dose-optimization program which includes automated exposure control, adjustment of the mA and/or kV according to patient size and/or use of iterative reconstruction technique. COMPARISON:  CT venogram April 19, 2022. FINDINGS: Brain: Mild loss of gray-Loadholt differentiation left parietotemporal region with some streak artifact in this region. No evidence of acute hemorrhage, hydrocephalus, extra-axial collection or mass lesion/mass effect. Vascular: No hyperdense vessel identified. Skull: No acute fracture. Sinuses/Orbits: Clear sinuses.  No acute orbital findings. ASPECTS [facility] Stroke Program Early CT Score) Total score (0-10 with 10 being normal): 7-8 at worst. Dr. Selina Cooley paged for call of report at the time of dictation. IMPRESSION: 1. Mild loss of gray-Machnik differentiation left parietotemporal region may represent left MCA territory acute infarct or artifact given streak artifact through this region. Recommend MRI to further evaluate. 2. No acute hemorrhage. Electronically Signed   By: Feliberto Harts M.D.   On: 02/21/2023 11:00    Labs:  CBC: Recent Labs    02/21/23 1042 02/21/23 1046 02/22/23 0501 02/23/23 1204 02/24/23 0428  WBC 5.6  --  7.3 15.9* 11.5*  HGB 8.4* 8.8* 7.2* 8.4* 7.6*  HCT 26.8* 26.0* 22.1* 25.6* 23.6*  PLT 45*   --  44* 54* 44*    COAGS: Recent Labs    02/21/23 1042  INR 1.1  APTT 42*    BMP: Recent Labs    02/21/23 1042 02/21/23 1046 02/22/23 0501 02/23/23 1204 02/24/23 0428  NA 140 141 138 138 136  K 3.5 3.4* 3.8 3.5 3.4*  CL 108 107 110 108 107  CO2 19*  --  18* 15* 19*  GLUCOSE 80 77 78 100* 91  BUN 7 9 8 14 13   CALCIUM 8.5*  --  8.4* 8.8* 8.5*  CREATININE 0.84 0.90 0.90 1.02* 0.74  GFRNONAA >60  --  >60 >60 >60    LIVER FUNCTION TESTS: Recent Labs    04/08/22 0015 04/10/22 0148 04/19/22 1420 02/21/23 1042  BILITOT 0.3 1.0 0.7 0.4  AST 15 20 51* 20  ALT 9 12 48* 9  ALKPHOS 67 73 89 64  PROT 6.6 6.5 7.4 6.9  ALBUMIN 2.9* 2.6* 2.8* 3.4*    Assessment and Plan:  22 y/o F admitted as a code stroke s/p left MCA thrombectomy in NIR 02/21/23. Per procedure note initial contact aspiration successful, however progressive worsening of stenosis with subsequent complete closure of the left MCA inferior division during procedure.   Per report from bedside RN, patient is aphasic, not following commands, anxious/combative/screaming. Moving left upper and lower extremities spontaneously, no movement seen on right upper or lower extremity. Right CFA puncture site unremarkable.  No further NIR needs at this time, does not need outpatient follow up with [place] at this time. Further plans per primary team   NIR remains available as needed, please call with questions or concerns.  Electronically Signed: Alwyn Ren, AGACNP-BC [phone] 02/24/2023, 12:31 PM   I spent a total of 15 Minutes at the the patient's bedside AND on the patient's hospital floor or unit, greater than 50% of which was counseling/coordinating care for left MCA thrombosis.

## 2023-02-24 NOTE — Progress Notes (Signed)
Physical Therapy Treatment Patient Details Name: Jacqueline [name] MRN: [id number] DOB: 27-Feb-2001 Today's Date: 02/24/2023   History of Present Illness The pt is a 22 yo female presenting 12/20 with expressive aphasia. 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    PT Comments  Patient able to maintain sitting EOB with support for at least 10 minutes.  Working on postural awareness, attention, upright tolerance, balance, R side awareness and midline orientation.  She was twice demonstrating restless behaviors kicking L foot and moaning.  She tends to keep head cocked to R but allowed midline correction and maintains a few seconds.  She can move fingers on the R hand though weakly and inconsistently, and she pulled away with the R arm as well.  No active movement on R LE seen this session and with attempts at scoot-pivot/lateral scoot up toward [facility] she made no attempt for weight bearing on R LE even with anterior weight shifts and lifting help. Patient should continue to progress and anticipate appropriateness for intensive inpatient rehab prior to d/c home with family support.     If plan is discharge home, recommend the following: Two people to help with walking and/or transfers;Two people to help with bathing/dressing/bathroom;Assistance with cooking/housework;Assistance with feeding;Direct supervision/assist for medications management;Direct supervision/assist for financial management;Assist for transportation;Help with stairs or ramp for entrance;Supervision due to cognitive status   Can travel by private vehicle        Equipment Recommendations  Other (comment) (TBA)    Recommendations for Other Services       Precautions / Restrictions Precautions Precautions: Fall Precaution Comments: in restraints; R HP     Mobility  Bed  Mobility Overal bed mobility: Needs Assistance Bed Mobility: Supine to Sit, Sit to Supine     Supine to sit: Total assist, +2 for physical assistance Sit to supine: +2 for physical assistance, Max assist   General bed mobility comments: assist to lift trunk and move legs off EOB; assist to supine for legs onto bed, pt leaning onto bed    Transfers Overall transfer level: Needs assistance   Transfers: Bed to chair/wheelchair/BSC            Lateral/Scoot Transfers: +2 physical assistance, Max assist General transfer comment: assisted to scoot lateral toward HOB x 2, pt using L side some, but limited as no initiation on R    Ambulation/Gait                   Stairs             Wheelchair Mobility     Tilt Bed    Modified Rankin (Stroke Patients Only) Modified Rankin (Stroke Patients Only) Pre-Morbid Rankin Score: No symptoms Modified Rankin: Severe disability     Balance Overall balance assessment: Needs assistance   Sitting balance-Leahy Scale: Zero Sitting balance - Comments: leaning on therapist at times, pushing back at times needing max to total A of 1 though fluctuating as down to mod A at times as well; on EOB about 10 minutes working on arousal, attention, soothing when pt agitated and kicking L leg and frequent assist to keep head in midline Postural control: Right lateral lean, Posterior lean Standing balance support: Single extremity supported  Cognition Arousal: Lethargic Behavior During Therapy: Restless Overall Cognitive Status: Difficult to assess                                 General Comments: mobilized to EOB with full assist; at times restless, biting her L hand, kicking L foot and moaning, calmed and easily lethargic with rhythmic rocking, signing along with music on computer        Exercises Other Exercises Other Exercises: assisting to moblize R UE and LE throughout  time at EOB and pt with some active movement in fingers on R and noted pulling R hand away after return to supine, noted no active movement on R LE    General Comments General comments (skin integrity, edema, etc.): VSS, RN in the room assisting for mobility back to bed, and  at end of session stated ok to leave off restraints for now.      Pertinent Vitals/Pain Pain Assessment Pain Assessment: CPOT Facial Expression: Tense Body Movements: Protection Muscle Tension: Relaxed Compliance with ventilator (intubated pts.): N/A Vocalization (extubated pts.): Crying out, sobbing CPOT Total: 4 Pain Intervention(s): Monitored during session, Repositioned, Limited activity within patient's tolerance, RN gave pain meds during session    Home Living       Type of Home: Apartment                  Prior Function            PT Goals (current goals can now be found in the care plan section) Progress towards PT goals: Progressing toward goals    Frequency    Min 1X/week      PT Plan      Co-evaluation              AM-PAC PT "6 Clicks" Mobility   Outcome Measure  Help needed turning from your back to your side while in a flat bed without using bedrails?: Total Help needed moving from lying on your back to sitting on the side of a flat bed without using bedrails?: Total Help needed moving to and from a bed to a chair (including a wheelchair)?: Total Help needed standing up from a chair using your arms (e.g., wheelchair or bedside chair)?: Total Help needed to walk in hospital room?: Total Help needed climbing 3-5 steps with a railing? : Total 6 Click Score: 6    End of Session   Activity Tolerance: Treatment limited secondary to agitation Patient left: in bed;with nursing/sitter in room   PT Visit Diagnosis: Other abnormalities of gait and mobility (R26.89);Hemiplegia and hemiparesis;Other symptoms and signs involving the nervous system (R29.898) Hemiplegia -  Right/Left: Right Hemiplegia - dominant/non-dominant: Dominant Hemiplegia - caused by: Cerebral infarction     Time: 5638-7564 PT Time Calculation (min) (ACUTE ONLY): 24 min  Charges:    $Therapeutic Activity: 23-37 mins PT General Charges $$ ACUTE PT VISIT: 1 Visit                     Sheran Lawless, PT Acute Rehabilitation Services [phone] 02/24/2023    Elray Mcgregor 02/24/2023, 1:29 PM

## 2023-02-24 NOTE — Progress Notes (Signed)
Initial Nutrition Assessment  DOCUMENTATION CODES:   Not applicable  INTERVENTION:   Initiate tube feeding via Cortrak tube: Osmolite 1.5 at 55 ml/h (1320 ml per day) Prosource TF20 60 ml BID  Provides 2140 kcal, 122 gm protein, 1003 ml free water daily  Continue MVI, Vitamin B12, thiamine, and iron supplementation  Will monitor phosphorus and magnesium, MD to replete if needed  NUTRITION DIAGNOSIS:   Inadequate oral intake related to inability to eat as evidenced by NPO status.  GOAL:   Patient will meet greater than or equal to 90% of their needs  MONITOR:   TF tolerance  REASON FOR ASSESSMENT:   Consult Enteral/tube feeding initiation and management  ASSESSMENT:   Pt with PMH of hemoglobin C trait, unprovoked PE not currently on anticoagulation admitted with aphasia after heavy alcohol use and recreation use of Suboxone. 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   Per abd xray pt with moderate colonic stool burden Spoke with RN at bedside.    12/20 - s/p mechanical thrombectomy attempted but unsuccessful, extubated after  12/21 - significant agitation requiring restrains and Precedex 12/23 - s/p cortrak placement; per xray tip in distal stomach   Medications reviewed and include: Vitamin B12 2000 mg daily, folic acid, iron polysaccharides 150 mg daily (last dose today), MVI with minerals daily, thiamine 100 mg daily  Dexmedetomidine Iron sucrose 300 mg daily IV (last dose 12/27)   Labs reviewed:  K 3.4 Mg 1.9 Fe 15 Ferritin 4 Vitamin B12 122 A1C 4.4   NUTRITION - FOCUSED PHYSICAL EXAM:  Flowsheet Row Most Recent Value  Orbital Region No depletion  Upper Arm Region No depletion  Thoracic and Lumbar Region Mild depletion  Buccal Region No depletion  Temple Region No depletion  Clavicle Bone Region No depletion  Clavicle and  Acromion Bone Region No depletion  Scapular Bone Region No depletion  Dorsal Hand Unable to assess  Patellar Region No depletion  Anterior Thigh Region No depletion  Posterior Calf Region No depletion  Edema (RD Assessment) None  Hair Reviewed  Eyes Unable to assess  Mouth Unable to assess  Skin Reviewed  Nails Unable to assess       Diet Order:   Diet Order             Diet NPO time specified  Diet effective now                   EDUCATION NEEDS:   No education needs have been identified at this time  Skin:  Skin Assessment: Reviewed RN Assessment (incision R groin)  Last BM:  unknown, per xray pt with moderate stool burden in colon  Height:   Ht Readings from Last 1 Encounters:  02/21/23 5\' 4"  (1.626 m)    Weight:   Wt Readings from Last 1 Encounters:  02/21/23 72.6 kg    BMI:  Body mass index is 27.46 kg/m.  Estimated Nutritional Needs:   Kcal:  2000-2200  Protein:  100-115 grams  Fluid:  >2 L/day  Cammy Copa., RD, LDN, CNSC See AMiON for contact information

## 2023-02-24 NOTE — Progress Notes (Addendum)
STROKE TEAM PROGRESS NOTE   BRIEF HPI [name] is a 22 y.o. 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  The night before presentation, she drank a significant amount of alcohol and took some Suboxone that was not prescribed to her.  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  She went to IR for mechanical thrombectomy, but this was unfortunately unsuccessful.  This morning, patient is noted to have continued expressive aphasia and right hemiparesis.  Patient had a significant episode of agitation this morning with combativeness requiring use of restraints and initiation of Precedex drip.  Per patient's boyfriend, she does not drink every day and does not typically use illicit drugs.  NIH on Admission 3   SIGNIFICANT HOSPITAL EVENTS 12/20-patient admitted, mechanical thrombectomy attempted but unsuccessful 12/21-significant episode of agitation requiring restraints and Precedex  INTERIM HISTORY/SUBJECTIVE Patient's blood pressure has been stable on the low side.  She was febrile yesterday to 102.3, chest x-ray shows no pneumonia and urinalysis is pending.  She continues to have intermittent episodes of agitation requiring Precedex.  Will insert core track today so that Seroquel and other p.o. medications for agitation can be provided. OBJECTIVE  CBC    Component Value Date/Time   WBC 11.5 (H) 02/24/2023 0428   RBC 3.27 (L) 02/24/2023 0428   HGB 7.6 (L) 02/24/2023 0428   HGB 10.7 (L) 05/21/2017 1026   HCT 23.6 (L) 02/24/2023 0428   HCT 32.4 (L) 05/21/2017 1026   PLT 44 (L) 02/24/2023 0428   PLT 118 (L) 05/21/2017 1026   MCV 72.2 (L) 02/24/2023 0428   MCV 82 05/21/2017 1026   MCH 23.2 (L) 02/24/2023 0428   MCHC 32.2 02/24/2023 0428   RDW 20.2 (H) 02/24/2023 0428   RDW 15.3 05/21/2017 1026   LYMPHSABS 2.4 02/22/2023 0501   LYMPHSABS 2.3 02/28/2017 1618   MONOABS  0.6 02/22/2023 0501   EOSABS 0.0 02/22/2023 0501   EOSABS 0.0 02/28/2017 1618   BASOSABS 0.0 02/22/2023 0501   BASOSABS 0.0 02/28/2017 1618    BMET    Component Value Date/Time   NA 136 02/24/2023 0428   K 3.4 (L) 02/24/2023 0428   CL 107 02/24/2023 0428   CO2 19 (L) 02/24/2023 0428   GLUCOSE 91 02/24/2023 0428   BUN 13 02/24/2023 0428   CREATININE 0.74 02/24/2023 0428   CALCIUM 8.5 (L) 02/24/2023 0428   GFRNONAA >60 02/24/2023 0428    IMAGING past 24 hours DG Abd Portable 1V Result Date: 02/24/2023 CLINICAL DATA:  Feeding tube placement EXAM: PORTABLE ABDOMEN - 1 VIEW COMPARISON:  None Available. FINDINGS: The enteric tube terminates in the stomach, with the distal tip near the expected region of the pylorus. Nonobstructive bowel-gas pattern. Moderate colonic stool burden. The visualized bibasilar lungs clear. No acute osseous abnormality. IMPRESSION: The enteric tube terminates in the distal stomach. Electronically Signed   By: Jacob Moores M.D.   On: 02/24/2023 11:25   CT HEAD WO CONTRAST ( ) Result Date: 02/23/2023 CLINICAL DATA:  Right history: Stroke, follow-up. EXAM: CT HEAD WITHOUT CONTRAST TECHNIQUE: Contiguous axial images were obtained from the base of the skull through the vertex without intravenous contrast. RADIATION DOSE REDUCTION: This exam was performed according to the departmental dose-optimization program which includes automated exposure control, adjustment of the mA and/or kV according to patient size and/or use of iterative reconstruction technique. COMPARISON:  Brain MRI 02/22/2023. FINDINGS: Brain: Known  fairly extensive acute MCA territory and watershed territory infarcts within the left cerebral hemisphere, not appreciably changed from the prior brain MRI of 02/22/2023. Known small acute right frontoparietal infarcts also again demonstrated. No midline shift or significant mass effect. No evidence of hemorrhagic conversion. No extra-axial fluid collection.  No evidence of an intracranial mass. Vascular: Abnormal hyperdensity of a left MCA M2 vessel. Skull: No calvarial fracture or aggressive osseous lesion. Sinuses/Orbits: No orbital mass or acute orbital finding at the imaged levels. No significant paranasal sinus disease at the imaged levels. IMPRESSION: 1. Known acute infarcts within the left greater than right cerebral hemispheres, not appreciably changed in extent from the prior brain MRI of 02/22/2023. No midline shift or significant mass effect. No evidence of hemorrhagic conversion. 2. 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 This is at site of the vascular signal abnormality described on yesterday's MRI. Electronically Signed   By: Jackey Loge D.O.   On: 02/23/2023 13:46    Vitals:   02/24/23 0700 02/24/23 0730 02/24/23 0800 02/24/23 0830  BP: 102/65 115/82 110/65 (!) 122/102  Pulse: 93 (!) 123 93 (!) 155  Resp: (!) 24 (!) 30 (!) 24 (!) 23  Temp:   98.5 F (36.9 C)   TempSrc:   Axillary   SpO2: 96% 97% 96% 98%  Weight:      Height:         PHYSICAL EXAM General: Drowsy, well-nourished, well-developed patient with intermittent agitation Psych: Affect blunted CV: Regular rate and rhythm on monitor Respiratory:  Regular, unlabored respirations on room air but notable cough  NEURO:  Patient is nonverbal today.  She does not blink to threat on the right and moves left upper and lower extremities with good antigravity strength.  She is able to move her right upper extremity but cannot lift it off the bed and does not move her right lower extremity.  She does not consistently follow commands.  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  Left atrium size normal.  Cannot rule out small PFO and right-to-left shunt.  However this was not noticed on previous echo on 04/08/2022  LDL 42 HgbA1c 4.4 VTE prophylaxis -SCDs No antithrombotic prior to admission, now on aspirin 81 mg daily, but not starting anticoagulation as platelets are 44 today Therapy recommendations:  CIR Disposition: Pending  Episode of agitation Patient had significant episode of agitation this morning with combativeness, requiring administration of Haldol and Versed Continue Precedex IV Per patient's significant other, she does not frequently drink or use illicit substances, but concern for withdrawal still remains given alcohol and Suboxone use prior to admission  Hypertension Home meds: None Stable Blood Pressure Goal: SBP 120-160 for first 24 hours then less than 180   Hyperlipidemia Home meds: None LDL 42, goal < 70 High intensity statin not indicated as LDL below goal  Tobacco Abuse Patient smokes cigars Will discuss tobacco cessation when patient is alert and oriented  Substance Abuse Patient uses Suboxone UDS negative Will discuss  substance abuse and cessation when patient is alert and oriented  Dysphagia Patient has post-stroke dysphagia, SLP consulted    Diet   Diet NPO time specified   Advance diet as tolerated  Other Stroke Risk Factors ETOH use, alcohol level 14, advised patient to drink no more than 1 drink a day   Other Active Problems History of pulmonary embolus-patient was on Eliquis but stopped taking this medication in June. 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 Per hematology, can start anticoagulation when platelet count is greater than 50, but it is 44 again today.  Hospital day # 3  Patient seen by NP with MD, MD to edit note as needed. Cortney E Ernestina Columbia , MSN, AGACNP-BC [facility] See Amion for schedule and pager information 02/24/2023 11:41 AM I have personally obtained history,examined this patient, reviewed notes, independently viewed imaging studies, participated in medical decision making and plan of care.ROS completed by me personally and pertinent positives fully documented  I have made any additions or clarifications directly to the above note. Agree with note above.  Patient remains agitated requiring sedation.  She is also aphasic with right hemiparesis.  Patient will be unable to swallow hence will insert core track tube for nutrition and medications.  Mobilize out of bed.  Continue ongoing therapy consults.  Check lower extremity venous Doppler for DVT.  Await results of anticardiolipin antibodies.  Will hold off on anticoagulation given persistent low platelet count of 44,000.  Appreciate hematology consult.  Discussed with Dr. Denese Killings critical care medicine.  Greater than 50% time during this 50-minute visit was spent in counseling and coordination of care about her stroke and thrombocytopenia hypercoagulability and discussion with patient and boyfriend and care team and answering questions.  Delia Heady, MD Medical Director [facility] Pager: [phone] 02/24/2023 2:21 PM  To contact Stroke Continuity provider, please refer to [email or web address]. After hours, contact General Neurology

## 2023-02-24 NOTE — Progress Notes (Signed)
NAME:  Jacqueline [name], MRN:  [id number], DOB:  06-21-2000, LOS: 3 ADMISSION DATE:  02/21/2023, CONSULTATION DATE:  02/22/2023 REFERRING MD:  Pearlean Brownie, CHIEF COMPLAINT:  agitation    History of Present Illness:  22 year old female with past medical history of hemoglobin C trait, unprovoked PE not currently on anticoagulation who presented to the ED on 02/21/23 with expressive and receptive aphasia after heavy alcohol use and recreational suboxone use. She had CT which showed M2 occlusion which was revascularized and then reoccluded. She was extubated post-procedurally and able to follow simple commands, move all extremities.   Since extubation has had progressive agitation, aphasic, weakness to the right side. She has been cursing, throwing items, bit a bedside RN, requiring restraints. Significant other at bedside stating unusual behavior for her.   PCCM consulted by stroke team for help with management   Pertinent  Medical History  Hemoglobin C trait, unprovoked PE  Significant Hospital Events: Including procedures, antibiotic start and stop dates in addition to other pertinent events   12/20: admitted for stroke, revascularization of M2 segment, reocclusion  12/21: initially given versed, precedex for agitation, MRI for behavior. Thrombocytopenia > consulted hematology  12/22: ongoing aphasia, participating in therapy  12/23: agitated, not following commands, aphasic, requiring PRNs to help with behavior. Attempting to place cortrak today after versed to obtain enteral nutrition, oral psych/neuro meds   Interim History / Subjective:  This AM with ongoing agitation. Patient swinging arms, screaming intermittently. Not following commands. Now coughing with attempts to give water, so made NPO. SLP to see. Obtaining echo but difficulty with agitation. Given haldol with no effect. Still on precedex. Giving versed for cortrak placement, neuro starting depacon  Objective   Blood pressure 102/65,  pulse 93, temperature 98.2 F (36.8 C), temperature source Axillary, resp. rate (!) 24, height 5\' 4"  (1.626 m), weight 72.6 kg, SpO2 96%, unknown if currently breastfeeding.        Intake/Output Summary (Last 24 hours) at 02/24/2023 4098 Last data filed at 02/24/2023 0700 Gross per 24 hour  Intake 298.34 ml  Output 850 ml  Net -551.66 ml   Filed Weights   02/21/23 1000 02/21/23 1114  Weight: 80.4 kg 72.6 kg    Examination: General: young female, laying in bed, mod distress/agitation.  HENT: limited 2/2 agitation, eyes track and cross midline, mucous membranes moist, room air  Lungs: clear bilaterally, no respiratory distress  Cardiovascular: s1/s2 no murmur appreciated, tachycardia  Abdomen: soft, non-distended Extremities: no pitting edema  Neuro: awake, alert. She does not respond to orientation questions, screams intermittently. Aphasic. Does not follow commands. Spontaneously moves LUE/LLL. The RUE is flexed up and she does not allow me to extend it or evaluate strength. RLE weak, but spontaneously moves  GU: foley  Resolved Hospital Problem list     Assessment & Plan:  Acute CVA with thrombus to M2 s/p failed revascularization; Likely from hypercoagulable state. F/u MRI with acute left MCA infarct without hemorrhagic conversion. LDL 42.  - neurology primary, appreciate management  - DAPT today? Per neuro. On ASA 81mg  - con't SCD  - PT/OT/SLP  Agitated behavior; multifactorial in the setting of delirium and provoked by aphasia  - con't precedex  - given haldol without improvement  - start depacon per neuro  - versed for cortrak placement then can give PO (seroquel)  Alcohol use; unclear extent of alcohol use. Reported heavy drinking night of admission. Does not have a macrocytic anemia, elevated LFTs, etc.  - con't thiamine,  folic acid, mtvn empirically  - counsel when clinically appropriate   Hemoglobin C trait  History of unprovoked PE not on Eliquis. Positive  lupus anticoagulant  Microcytic hypochromic anemia, chronic  IDA  Thrombocytopenic, chronic, no diagnosis  - replete iron - hematology consult, appreciate recs  - trend cbc  - no VTE ppx yet, but thrombocytopenic, appreciate stroke and hematology recs   B12 deficiency  - replete   Best Practice (right click and "Reselect all SmartList Selections" daily)   Diet/type: NPO DVT prophylaxis: SCD Pressure ulcer(s): na GI prophylaxis: N/A Lines: N/A Foley:  Yes, and it is still needed Code Status:  full code Last date of multidisciplinary goals of care discussion [significant other updated at bedside on rounds]  Labs   CBC: Recent Labs  Lab 02/21/23 1042 02/21/23 1046 02/22/23 0501 02/23/23 1204 02/24/23 0428  WBC 5.6  --  7.3 15.9* 11.5*  NEUTROABS 1.8  --  4.2  --   --   HGB 8.4* 8.8* 7.2* 8.4* 7.6*  HCT 26.8* 26.0* 22.1* 25.6* 23.6*  MCV 71.3*  --  69.3* 70.5* 72.2*  PLT 45*  --  44* 54* 44*    Basic Metabolic Panel: Recent Labs  Lab 02/21/23 1042 02/21/23 1046 02/22/23 0501 02/23/23 1204 02/24/23 0428  NA 140 141 138 138 136  K 3.5 3.4* 3.8 3.5 3.4*  CL 108 107 110 108 107  CO2 19*  --  18* 15* 19*  GLUCOSE 80 77 78 100* 91  BUN 7 9 8 14 13   CREATININE 0.84 0.90 0.90 1.02* 0.74  CALCIUM 8.5*  --  8.4* 8.8* 8.5*  MG  --   --   --   --  1.9   GFR: Estimated Creatinine Clearance: 106 mL/min (by C-G formula based on SCr of 0.74 mg/dL). Recent Labs  Lab 02/21/23 1042 02/22/23 0501 02/23/23 1204 02/24/23 0428  WBC 5.6 7.3 15.9* 11.5*    Liver Function Tests: Recent Labs  Lab 02/21/23 1042  AST 20  ALT 9  ALKPHOS 64  BILITOT 0.4  PROT 6.9  ALBUMIN 3.4*   No results for input(s): "LIPASE", "AMYLASE" in the last 168 hours. No results for input(s): "AMMONIA" in the last 168 hours.  ABG    Component Value Date/Time   TCO2 23 02/21/2023 1046     Coagulation Profile: Recent Labs  Lab 02/21/23 1042  INR 1.1    Cardiac Enzymes: No results  for input(s): "CKTOTAL", "CKMB", "CKMBINDEX", "TROPONINI" in the last 168 hours.  HbA1C: Hgb A1c MFr Bld  Date/Time Value Ref Range Status  02/21/2023 05:18 PM 4.4 (L) 4.8 - 5.6 % Final    Comment:    (NOTE) Pre diabetes:          5.7%-6.4%  Diabetes:              >6.4%  Glycemic control for   <7.0% adults with diabetes   02/01/2016 02:40 PM 4.8 4.8 - 5.6 % Final    Comment:             Pre-diabetes: 5.7 - 6.4          Diabetes: >6.4          Glycemic control for adults with diabetes: <7.0     CBG: Recent Labs  Lab 02/21/23 1038 02/21/23 1156  GLUCAP 56* 75    Review of Systems:   As above   Past Medical History:  She,  has a past medical history of Bacterial  vaginitis (07/10/2016), Candida vaginitis (07/24/2016), Headache in pregnancy, antepartum, third trimester (07/24/2016), and Kell isoimmunization during pregnancy.   Surgical History:   Past Surgical History:  Procedure Laterality Date   CESAREAN SECTION N/A 09/04/2016   Procedure: CESAREAN SECTION;  Surgeon: Levie Heritage, DO;  Location: [facility] SU[facility];  Service: Obstetrics;  Laterality: N/A;   CESAREAN SECTION Bilateral    RADIOLOGY WITH ANESTHESIA N/A 02/21/2023   Procedure: IR WITH ANESTHESIA;  Surgeon: Julieanne Cotton, MD;  Location: [facility] OR;  Service: Radiology;  Laterality: N/A;     Social History:   reports that she has been smoking cigars. She has never used smokeless tobacco. She reports current alcohol use. She reports that she does not use drugs.   Family History:  Her family history includes Diabetes in her maternal grandmother; Hypertension in her maternal grandmother. There is no history of Cancer.   Allergies No Known Allergies   Home Medications  Prior to Admission medications   Medication Sig Start Date End Date Taking? Authorizing Provider  acetaminophen (TYLENOL) 500 MG tablet Take 500 mg by mouth every 6 (six) hours as needed for moderate pain (pain score 4-6).   Yes [provider]  ibuprofen (ADVIL) 200 MG tablet Take 200-600 mg by mouth every 6 (six) hours as needed for moderate pain (pain score 4-6).   Yes [provider]  apixaban (ELIQUIS) 5 MG TABS tablet Take apixaban 10 mg (2 tabs) twice daily until [facility] Feb 14 then reduce to apixaban 5 mg (1 tab) twice daily Patient not taking: Reported on 02/22/2023 04/14/22   Alberteen Sam, MD     Critical care time: 5    Lenard Galloway [facility] 02/24/23 10:28 AM  Please see [email or web address] for pager details.  From 7A-7P if no response, please call [phone] After hours, please call ELink [phone]

## 2023-02-24 NOTE — Progress Notes (Signed)
Pt yelling and screaming, MD notified and okayed to restart precedex

## 2023-02-24 NOTE — Procedures (Signed)
Cortrak  Person Inserting Tube:  Kendell Bane C, RD Tube Type:  Cortrak - 43 inches Tube Size:  10 Tube Location:  Left nare Secured by: Bridle Technique Used to Measure Tube Placement:  Marking at nare/corner of mouth Cortrak Secured At:  61 cm   Cortrak Tube Team Note:  Consult received to place a Cortrak feeding tube.   X-ray is required, abdominal x-ray has been ordered by the Cortrak team. Please confirm tube placement before using the Cortrak tube.   If the tube becomes dislodged please keep the tube and contact the Cortrak team at [email or web address] for replacement.  If after hours and replacement cannot be delayed, place a NG tube and confirm placement with an abdominal x-ray.    Cammy Copa., RD, LDN, CNSC See AMiON for contact information

## 2023-02-24 NOTE — Progress Notes (Signed)
  Echocardiogram 2D Echocardiogram has been performed.  Ocie Doyne RDCS 02/24/2023, 8:32 AM

## 2023-02-25 ENCOUNTER — Inpatient Hospital Stay (HOSPITAL_COMMUNITY): Payer: Medicaid Other

## 2023-02-25 DIAGNOSIS — I63512 Cerebral infarction due to unspecified occlusion or stenosis of left middle cerebral artery: Secondary | ICD-10-CM | POA: Diagnosis not present

## 2023-02-25 LAB — BASIC METABOLIC PANEL
Anion gap: 11 (ref 5–15)
BUN: 12 mg/dL (ref 6–20)
CO2: 20 mmol/L — ABNORMAL LOW (ref 22–32)
Calcium: 8.5 mg/dL — ABNORMAL LOW (ref 8.9–10.3)
Chloride: 108 mmol/L (ref 98–111)
Creatinine, Ser: 0.74 mg/dL (ref 0.44–1.00)
GFR, Estimated: >60 mL/min (ref >60)
Glucose, Bld: 138 mg/dL — ABNORMAL HIGH (ref 70–99)
Potassium: 3.2 mmol/L — ABNORMAL LOW (ref 3.5–5.1)
Sodium: 139 mmol/L (ref 135–145)

## 2023-02-25 LAB — CBC WITH DIFFERENTIAL/PLATELET
Abs Immature Granulocytes: 0.03 10*3/uL (ref 0.00–0.07)
Basophils Absolute: 0 10*3/uL (ref 0.0–0.1)
Basophils Relative: 0 %
Eosinophils Absolute: 0 10*3/uL (ref 0.0–0.5)
Eosinophils Relative: 0 %
HCT: 24.7 % — ABNORMAL LOW (ref 36.0–46.0)
Hemoglobin: 7.9 g/dL — ABNORMAL LOW (ref 12.0–15.0)
Immature Granulocytes: 0 %
Lymphocytes Relative: 21 %
Lymphs Abs: 1.7 10*3/uL (ref 0.7–4.0)
MCH: 22.6 pg — ABNORMAL LOW (ref 26.0–34.0)
MCHC: 32 g/dL (ref 30.0–36.0)
MCV: 70.8 fL — ABNORMAL LOW (ref 80.0–100.0)
Monocytes Absolute: 0.7 10*3/uL (ref 0.1–1.0)
Monocytes Relative: 9 %
Neutro Abs: 5.6 10*3/uL (ref 1.7–7.7)
Neutrophils Relative %: 70 %
Platelets: 48 10*3/uL — ABNORMAL LOW (ref 150–400)
RBC: 3.49 MIL/uL — ABNORMAL LOW (ref 3.87–5.11)
RDW: 20.5 % — ABNORMAL HIGH (ref 11.5–15.5)
WBC: 8.1 10*3/uL (ref 4.0–10.5)
nRBC: 0 % (ref 0.0–0.2)

## 2023-02-25 LAB — PHOSPHORUS
Phosphorus: 3.4 mg/dL (ref 2.5–4.6)
Phosphorus: 2.8 mg/dL (ref 2.5–4.6)

## 2023-02-25 LAB — GLUCOSE, CAPILLARY
Glucose-Capillary: 106 mg/dL — ABNORMAL HIGH (ref 70–99)
Glucose-Capillary: 106 mg/dL — ABNORMAL HIGH (ref 70–99)
Glucose-Capillary: 115 mg/dL — ABNORMAL HIGH (ref 70–99)
Glucose-Capillary: 135 mg/dL — ABNORMAL HIGH (ref 70–99)
Glucose-Capillary: 138 mg/dL — ABNORMAL HIGH (ref 70–99)
Glucose-Capillary: 144 mg/dL — ABNORMAL HIGH (ref 70–99)

## 2023-02-25 LAB — MAGNESIUM
Magnesium: 2 mg/dL (ref 1.7–2.4)
Magnesium: 2.2 mg/dL (ref 1.7–2.4)

## 2023-02-25 LAB — RPR
RPR Ser Ql: REACTIVE — AB
RPR Titer: 1:1 {titer}

## 2023-02-25 LAB — CARDIOLIPIN ANTIBODIES, IGG, IGM, IGA
Anticardiolipin IgA: <9 [APL'U]/mL (ref 0–11)
Anticardiolipin IgG: <9 [GPL'U]/mL (ref 0–14)
Anticardiolipin IgM: 27 [MPL'U]/mL — ABNORMAL HIGH (ref 0–12)

## 2023-02-25 LAB — HIV ANTIBODY (ROUTINE TESTING W REFLEX): HIV Screen 4th Generation wRfx: NONREACTIVE

## 2023-02-25 MED ORDER — POTASSIUM CHLORIDE 20 MEQ PO PACK
80.0000 meq | PACK | Freq: Once | ORAL | Status: DC
  Filled 2023-02-25: qty 4

## 2023-02-25 MED ORDER — POTASSIUM CHLORIDE 20 MEQ PO PACK: 60.0000 meq | PACK | Freq: Once | ORAL | Status: DC

## 2023-02-25 MED ORDER — SODIUM CHLORIDE 0.9 % IV SOLN
300.0000 mg | Freq: Every day | INTRAVENOUS | Status: AC
  Administered 2023-02-25 – 2023-02-27 (×3): 300 mg via INTRAVENOUS
  Filled 2023-02-25 (×3): qty 15

## 2023-02-25 MED ORDER — POTASSIUM CHLORIDE CRYS ER 20 MEQ PO TBCR: 80.0000 meq | EXTENDED_RELEASE_TABLET | Freq: Once | ORAL | Status: DC

## 2023-02-25 MED ORDER — POTASSIUM CHLORIDE 20 MEQ PO PACK
40.0000 meq | PACK | ORAL | Status: AC
  Administered 2023-02-25 (×2): 40 meq

## 2023-02-25 MED ORDER — VALPROIC ACID 250 MG/5ML PO SOLN
500.0000 mg | Freq: Three times a day (TID) | ORAL | Status: DC
  Administered 2023-02-25 (×2): 500 mg
  Filled 2023-02-25 (×3): qty 10

## 2023-02-25 NOTE — Progress Notes (Signed)
Physical Therapy Treatment Patient Details Name: Jacqueline [name] MRN: [id number] DOB: 03-30-2000 Today's Date: 02/25/2023   History of Present Illness The pt is a 22 yo female presenting 12/20 with expressive aphasia. 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    PT Comments  Patient progressing with mobility while on EOB able to sit with min A briefly and held head up for a bit.  Also regarding her sister while on FaceTime moving eyes to L and keeping attention there.  Briefly labile when sister stating "I love you" several times.  Patient's friend Leeroy Bock playing music pt likes to increase attention as well.  PT will continue to follow.  Recommend inpatient rehab (>3 hours/day) prior to d/c home with family support.     If plan is discharge home, recommend the following: Two people to help with walking and/or transfers;Two people to help with bathing/dressing/bathroom;Assistance with cooking/housework;Assistance with feeding;Direct supervision/assist for medications management;Direct supervision/assist for financial management;Assist for transportation;Help with stairs or ramp for entrance;Supervision due to cognitive status   Can travel by private vehicle        Equipment Recommendations  Other (comment) (TBA)    Recommendations for Other Services       Precautions / Restrictions Precautions Precautions: Fall Precaution Comments: in restraints; R HP     Mobility  Bed Mobility Overal bed mobility: Needs Assistance Bed Mobility: Supine to Sit, Sit to Supine     Supine to sit: Max assist, HOB elevated Sit to supine: Max assist, +2 for physical assistance   General bed mobility comments: up to EOB assisted bringing R leg off EOB and scooting hips, then lifting trunk and bringing L LE off EOB. to supine RN in the room to  assist and bring trunk down while PT lifting legs and +2 to scoot up in bed    Transfers                   General transfer comment: NT; focus of session on midline awareness, attention to L and sitting balance, head control    Ambulation/Gait                   Stairs             Wheelchair Mobility     Tilt Bed    Modified Rankin (Stroke Patients Only) Modified Rankin (Stroke Patients Only) Pre-Morbid Rankin Score: No symptoms Modified Rankin: Severe disability     Balance Overall balance assessment: Needs assistance   Sitting balance-Leahy Scale: Zero Sitting balance - Comments: intially max A for balance fluctuating to min A at times though much of session needing A for head control and to maintain in midline; tends to lean head to R and posterior                                    Cognition Arousal: Lethargic Behavior During Therapy: Flat affect, Lability Overall Cognitive Status: Difficult to assess                                 General Comments: initially lethargic, needing cues for forward attention to window then to friend "Leeroy Bock" visiting playing music pt likes to listen to and pt  attending to midline a little better, friend called pt's sister "Future" and pt able to attend to phone with sister on FaceTime moving eyes to L as well, but became tearful at times        Exercises      General Comments General comments (skin integrity, edema, etc.): VSS RN in the room assisting for back to bed; encouraged Chelsea to have visitors address [place] on her L side at times      Pertinent Vitals/Pain Pain Assessment Pain Assessment: Faces Faces Pain Scale: Hurts little more Pain Location: unable to state Pain Descriptors / Indicators: Discomfort, Grimacing Pain Intervention(s): Monitored during session, Repositioned    Home Living                          Prior Function            PT Goals  (current goals can now be found in the care plan section) Progress towards PT goals: Progressing toward goals    Frequency    Min 1X/week      PT Plan      Co-evaluation              AM-PAC PT "6 Clicks" Mobility   Outcome Measure  Help needed turning from your back to your side while in a flat bed without using bedrails?: Total Help needed moving from lying on your back to sitting on the side of a flat bed without using bedrails?: Total Help needed moving to and from a bed to a chair (including a wheelchair)?: Total Help needed standing up from a chair using your arms (e.g., wheelchair or bedside chair)?: Total Help needed to walk in hospital room?: Total Help needed climbing 3-5 steps with a railing? : Total 6 Click Score: 6    End of Session   Activity Tolerance: Patient limited by fatigue Patient left: in bed;with bed alarm set;with call bell/phone within reach;with family/visitor present   PT Visit Diagnosis: Other abnormalities of gait and mobility (R26.89);Hemiplegia and hemiparesis;Other symptoms and signs involving the nervous system (R29.898) Hemiplegia - Right/Left: Right Hemiplegia - dominant/non-dominant: Dominant Hemiplegia - caused by: Cerebral infarction     Time: 1450-1519 PT Time Calculation (min) (ACUTE ONLY): 29 min  Charges:    $Therapeutic Activity: 23-37 mins PT General Charges $$ ACUTE PT VISIT: 1 Visit                     Sheran Lawless, PT Acute Rehabilitation Services Office:[phone] 02/25/2023    Elray Mcgregor 02/25/2023, 6:02 PM

## 2023-02-25 NOTE — Plan of Care (Signed)
Progressing. Patient not very interactive in a meaningful way at this time due to agitation/anxiety and expressive and receptive communication issues/barriers. Boyfriend at bedside educated.

## 2023-02-25 NOTE — Progress Notes (Signed)
NAME:  Jacqueline [name], MRN:  [id number], DOB:  Feb 15, 2001, LOS: 4 ADMISSION DATE:  02/21/2023, CONSULTATION DATE:  02/22/2023 REFERRING MD:  Pearlean Brownie, CHIEF COMPLAINT:  agitation    History of Present Illness:  22 year old female with past medical history of hemoglobin C trait, unprovoked PE not currently on anticoagulation who presented to the ED on 02/21/23 with expressive and receptive aphasia after heavy alcohol use and recreational suboxone use. She had CT which showed M2 occlusion which was revascularized and then reoccluded. She was extubated post-procedurally and able to follow simple commands, move all extremities.   Since extubation has had progressive agitation, aphasic, weakness to the right side. She has been cursing, throwing items, bit a bedside RN, requiring restraints. Significant other at bedside stating unusual behavior for her.   PCCM consulted by stroke team for help with management   Pertinent  Medical History  Hemoglobin C trait, unprovoked PE  Significant Hospital Events: Including procedures, antibiotic start and stop dates in addition to other pertinent events   12/20: admitted for stroke, revascularization of M2 segment, reocclusion  12/21: initially given versed, precedex for agitation, MRI for behavior. Thrombocytopenia > consulted hematology  12/22: ongoing aphasia, participating in therapy  12/23: agitated, not following commands, aphasic, requiring PRNs to help with behavior. Attempting to place cortrak today after versed to obtain enteral nutrition, oral psych/neuro meds  12/24: behavior/agitation improving overnight/this AM. Still not following commands. Intermittent outbursts. Off precedex and on orals after obtaining cortrak.   Interim History / Subjective:  Patient is awake. She makes eye contact. Does not follow commands. Not currently agitated on exam and not on continuous sedation/precedex. Obtained cortrak yesterday and started on feeds, scheduled  klonopin, oxy, seroquel, depacon. Needs rehab. Significant other at bedside updated.  Objective   Blood pressure (!) 100/58, pulse (!) 102, temperature 99.8 F (37.7 C), temperature source Axillary, resp. rate (!) 21, height 5\' 4"  (1.626 m), weight 72.6 kg, SpO2 96%, unknown if currently breastfeeding.        Intake/Output Summary (Last 24 hours) at 02/25/2023 0704 Last data filed at 02/25/2023 0600 Gross per 24 hour  Intake 755.27 ml  Output 1050 ml  Net -294.73 ml   Filed Weights   02/21/23 1000 02/21/23 1114  Weight: 80.4 kg 72.6 kg    Examination: General: young female, laying in bed, no acute distress  HENT: PERRLA, mucous membranes dry, cortrak right nare  Lungs: clear bilaterally, no respiratory distress, on room air Cardiovascular: s1/s2 no murmur appreciated, tachycardia  Abdomen: soft, non-distended, +BS, TF  Extremities: no pitting edema  Neuro: awake, she tracks to my voice during exam, eyes cross midline. Does not follow commands. Weakness to RUE, no movement seen to RLE. Strength 5/5 left side. Aphasic.  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 F/u MRI with acute left MCA infarct without hemorrhagic conversion. LDL 42.  - neurology primary, appreciate management  - con't ASA, no DAPT given platelets 48.  - echo with small PFO, R>L shunt. EF 60-65% - con't SCD  - PT/OT/SLP, will need rehab   Agitated behavior; multifactorial in the setting of delirium and provoked by aphasia. Precedex stopped 12/23.  - con't scheduled seroquel, depacon  - con't scheduled klonopin and oxycodone (report of suboxone use PTA ?)  Alcohol use; unclear extent of alcohol use. Reported heavy drinking night of admission. Does not have a  macrocytic anemia, elevated LFTs, etc.  - con't thiamine, folic acid, mtvn empirically  - counsel when clinically appropriate    Hemoglobin C trait  History of unprovoked PE not on Eliquis. Positive lupus anticoagulant  Microcytic hypochromic anemia, chronic  IDA  Thrombocytopenic, chronic, no diagnosis  - f/u HIV, RPR - f/u BLE DVT studies today - replete iron - hematology consult, appreciate recs - do not see consult note yet  - trend cbc  - no VTE ppx yet, but thrombocytopenic, appreciate stroke and hematology recs   Urinary tract infection; UA yesterday +nitrites and leuks. Having fever (getting DVT study to r/o but could be cause if +) - single dose Rocephin 2g on 12/24 - f/u urine culture  - trend fever, wbc curve  - prn tylenol for fever   B12 deficiency  - replete   Best Practice (right click and "Reselect all SmartList Selections" daily)   Diet/type: tubefeeds and NPO w/ meds via tube DVT prophylaxis: SCD Pressure ulcer(s): na GI prophylaxis: N/A Lines: N/A Foley:  Yes, and it is still needed Code Status:  full code Last date of multidisciplinary goals of care discussion [significant other updated at bedside on rounds]  Labs   CBC: Recent Labs  Lab 02/21/23 1042 02/21/23 1046 02/22/23 0501 02/23/23 1204 02/24/23 0428  WBC 5.6  --  7.3 15.9* 11.5*  NEUTROABS 1.8  --  4.2  --   --   HGB 8.4* 8.8* 7.2* 8.4* 7.6*  HCT 26.8* 26.0* 22.1* 25.6* 23.6*  MCV 71.3*  --  69.3* 70.5* 72.2*  PLT 45*  --  44* 54* 44*    Basic Metabolic Panel: Recent Labs  Lab 02/21/23 1042 02/21/23 1046 02/22/23 0501 02/23/23 1204 02/24/23 0428 02/24/23 1556  NA 140 141 138 138 136  --   K 3.5 3.4* 3.8 3.5 3.4*  --   CL 108 107 110 108 107  --   CO2 19*  --  18* 15* 19*  --   GLUCOSE 80 77 78 100* 91  --   BUN 7 9 8 14 13   --   CREATININE 0.84 0.90 0.90 1.02* 0.74  --   CALCIUM 8.5*  --  8.4* 8.8* 8.5*  --   MG  --   --   --   --  1.9  --   PHOS  --   --   --   --   --  3.3   GFR: Estimated Creatinine Clearance: 106 mL/min (by C-G formula based on SCr of 0.74 mg/dL). Recent Labs  Lab  02/21/23 1042 02/22/23 0501 02/23/23 1204 02/24/23 0428  WBC 5.6 7.3 15.9* 11.5*    Liver Function Tests: Recent Labs  Lab 02/21/23 1042  AST 20  ALT 9  ALKPHOS 64  BILITOT 0.4  PROT 6.9  ALBUMIN 3.4*   No results for input(s): "LIPASE", "AMYLASE" in the last 168 hours. No results for input(s): "AMMONIA" in the last 168 hours.  ABG    Component Value Date/Time   TCO2 23 02/21/2023 1046     Coagulation Profile: Recent Labs  Lab 02/21/23 1042  INR 1.1    Cardiac Enzymes: No results for input(s): "CKTOTAL", "CKMB", "CKMBINDEX", "TROPONINI" in the last 168 hours.  HbA1C: Hgb A1c MFr Bld  Date/Time Value Ref Range Status  02/21/2023 05:18 PM 4.4 (L) 4.8 - 5.6 % Final    Comment:    (NOTE) Pre diabetes:          5.7%-6.4%  Diabetes:              >6.4%  Glycemic control for   <7.0% adults with diabetes   02/01/2016 02:40 PM 4.8 4.8 - 5.6 % Final    Comment:             Pre-diabetes: 5.7 - 6.4          Diabetes: >6.4          Glycemic control for adults with diabetes: <7.0     CBG: Recent Labs  Lab 02/21/23 1156 02/24/23 1653 02/24/23 1932 02/24/23 2343 02/25/23 0324  GLUCAP 75 122* 120* 119* 138*    Review of Systems:   As above   Past Medical History:  She,  has a past medical history of Bacterial vaginitis (07/10/2016), Candida vaginitis (07/24/2016), Headache in pregnancy, antepartum, third trimester (07/24/2016), and Kell isoimmunization during pregnancy.   Surgical History:   Past Surgical History:  Procedure Laterality Date   CESAREAN SECTION N/A 09/04/2016   Procedure: CESAREAN SECTION;  Surgeon: Levie Heritage, DO;  Location: [facility] BIRTHING SU[facility];  Service: Obstetrics;  Laterality: N/A;   CESAREAN SECTION Bilateral    RADIOLOGY WITH ANESTHESIA N/A 02/21/2023   Procedure: IR WITH ANESTHESIA;  Surgeon: Julieanne Cotton, MD;  Location: [facility] OR;  Service: Radiology;  Laterality: N/A;     Social History:   reports that she has been smoking  cigars. She has never used smokeless tobacco. She reports current alcohol use. She reports that she does not use drugs.   Family History:  Her family history includes Diabetes in her maternal grandmother; Hypertension in her maternal grandmother. There is no history of Cancer.   Allergies No Known Allergies   Home Medications  Prior to Admission medications   Medication Sig Start Date End Date Taking? Authorizing Provider  acetaminophen (TYLENOL) 500 MG tablet Take 500 mg by mouth every 6 (six) hours as needed for moderate pain (pain score 4-6).   Yes [provider]  ibuprofen (ADVIL) 200 MG tablet Take 200-600 mg by mouth every 6 (six) hours as needed for moderate pain (pain score 4-6).   Yes [provider]  apixaban (ELIQUIS) 5 MG TABS tablet Take apixaban 10 mg (2 tabs) twice daily until [facility] Feb 14 then reduce to apixaban 5 mg (1 tab) twice daily Patient not taking: Reported on 02/22/2023 04/14/22   Alberteen Sam, MD     Critical care time: 64    Lenard Galloway [facility] 02/25/23 7:04 AM  Please see [email or web address] for pager details.  From 7A-7P if no response, please call [phone] After hours, please call ELink [phone]

## 2023-02-25 NOTE — Progress Notes (Signed)
Speech Language Pathology Treatment: Dysphagia;Cognitive-Linquistic  Patient Details Name: Jacqueline [name] MRN: [id number] DOB: 06/26/2000 Today's Date: 02/25/2023 Time: 2355-7322 SLP Time Calculation (min) (ACUTE ONLY): 15 min  Assessment / Plan / Recommendation Clinical Impression  Pt is a little drowsy but very calm this morning. She makes more eye contact with SLP but does not verbalize. Attempted to offer ice chips and purees with no acceptance when brought to oral cavity, but she did initiate straw drinking x1. This resulted in immediate coughing and pt did not take anything further. Attempted some aphasia interventions too, focusing on multimodal communication. She did not respond to yes/no questions, repeat at the word level, or use gestures even after model. Attempted binary (vertical) choices on communication board, but attempts to communication seemed to get pt more upset. She became tearful, and started turning away from board, seemingly communicating in her own way that she did not want to work with it. She calmed down quickly once SLP stopped interventions. She would benefit from intensive SLP f/u for communication and dysphagia, but may need to work up to increasing amounts or at least find more meaningful things to incorporate into therapy for her.   HPI HPI: The pt is a 22 yo female presenting 12/20 with expressive aphasia. 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 MRI showed fairly extensive acute left MCA territory and watershed territory infarcts within the left cerebral hemisphere as well as small acute R frontoparietal cortical/subcortical infarcts. Pt initially passed the Yale swallow screen but become more aphasic and started to cough with PO intake. 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      SLP Plan  Continue with current plan of care       Recommendations for follow up therapy are one component of a multi-disciplinary discharge planning process, led by the attending physician.  Recommendations may be updated based on patient status, additional functional criteria and insurance authorization.    Recommendations  Diet recommendations: NPO;Other(comment) (can offer a few ice chips after oral care if accepting) Medication Administration: Via alternative means                  Oral care QID   Frequent or constant Supervision/Assistance Aphasia (R47.01);Dysphagia, unspecified (R13.10)     Continue with current plan of care     Mahala Menghini., M.A. CCC-SLP Acute Rehabilitation Services Office [phone]  Secure chat preferred   02/25/2023, 9:16 AM

## 2023-02-25 NOTE — Progress Notes (Addendum)
STROKE TEAM PROGRESS NOTE   BRIEF HPI [name] is a 22 y.o. 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  The night before presentation, she drank a significant amount of alcohol and took some Suboxone that was not prescribed to her.  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  She went to IR for mechanical thrombectomy, but this was unfortunately unsuccessful.  This morning, patient is noted to have continued expressive aphasia and right hemiparesis.  Patient had a significant episode of agitation this morning with combativeness requiring use of restraints and initiation of Precedex drip.  Per patient's boyfriend, she does not drink every day and does not typically use illicit drugs.  NIH on Admission 3   SIGNIFICANT HOSPITAL EVENTS 12/20-patient admitted, mechanical thrombectomy attempted but unsuccessful 12/21-significant episode of agitation requiring restraints and Precedex  INTERIM HISTORY/SUBJECTIVE Patient has been hemodynamically stable on the low side with a Tmax of 101.1.  Urinalysis came back positive, and she is receiving 1 dose of Rocephin for UTI.  Agitation has improved greatly, with patient no longer requiring Precedex. OBJECTIVE  CBC    Component Value Date/Time   WBC 8.1 02/25/2023 0509   RBC 3.49 (L) 02/25/2023 0509   HGB 7.9 (L) 02/25/2023 0509   HGB 10.7 (L) 05/21/2017 1026   HCT 24.7 (L) 02/25/2023 0509   HCT 32.4 (L) 05/21/2017 1026   PLT 48 (L) 02/25/2023 0509   PLT 118 (L) 05/21/2017 1026   MCV 70.8 (L) 02/25/2023 0509   MCV 82 05/21/2017 1026   MCH 22.6 (L) 02/25/2023 0509   MCHC 32.0 02/25/2023 0509   RDW 20.5 (H) 02/25/2023 0509   RDW 15.3 05/21/2017 1026   LYMPHSABS 1.7 02/25/2023 0509   LYMPHSABS 2.3 02/28/2017 1618   MONOABS 0.7 02/25/2023 0509   EOSABS 0.0 02/25/2023 0509   EOSABS 0.0 02/28/2017 1618   BASOSABS 0.0 02/25/2023  0509   BASOSABS 0.0 02/28/2017 1618    BMET    Component Value Date/Time   NA 139 02/25/2023 0509   K 3.2 (L) 02/25/2023 0509   CL 108 02/25/2023 0509   CO2 20 (L) 02/25/2023 0509   GLUCOSE 138 (H) 02/25/2023 0509   BUN 12 02/25/2023 0509   CREATININE 0.74 02/25/2023 0509   CALCIUM 8.5 (L) 02/25/2023 0509   GFRNONAA >60 02/25/2023 0509    IMAGING past 24 hours No results found.   Vitals:   02/25/23 1000 02/25/23 1100 02/25/23 1200 02/25/23 1300  BP: 111/67 103/60 112/82 108/79  Pulse: (!) 111 (!) 108 (!) 104 (!) 108  Resp: 20 (!) 22 18 18   Temp:   98.4 F (36.9 C)   TempSrc:   Axillary   SpO2: 97% 96% 97% 97%  Weight:      Height:         PHYSICAL EXAM General: Drowsy, well-nourished, well-developed patient in no acute distress Psych: Affect blunted CV: Regular rate and rhythm on monitor Respiratory:  Regular, unlabored respirations on room air  NEURO:  Patient is drowsy and nonverbal today.  She does not blink to threat on the right and moves left upper and lower extremities with good antigravity strength.  She does not move her right upper and lower extremities today.  She is unable to follow commands.  ASSESSMENT/PLAN  Stroke:  left MCA territory infarct s/p unsuccessful IR, etiology: likely related to hypercoagulability from positive lupus anticoagulant not on [facility]  Code Stroke CT head left parietotemporal \\infarct  ASPECTS 7-8 CTA head & neck occluded left proximal to mid M2 MCA CT perfusion 21 mm penumbra in left parietotemporal region S/p IR - initially TICI3, but progressive worsening stenosis with subsequent complete closure of left M2. Intermittent mild improvement in cailber with verapamil and nitroglycerine.  MRI acute left MCA territory infarct without hemorrhagic conversion 2D Echo EF 60 to 65%.  Left atrium size normal. LDL 42 HgbA1c 4.4 UDS neg VTE prophylaxis -SCDs No antithrombotic prior to admission, now on aspirin 81 mg daily. Need to consider  AC once thrombocytopenia improves.  Therapy recommendations:  CIR Disposition: Pending  Episode of agitation, improved Patient had significant episode of agitation 12/23 with combativeness, requiring administration of Haldol and Versed Was on precedex, now off, will continue to monitor Per patient's significant other, she does not frequently drink or use illicit substances, but concern for withdrawal still remains given alcohol and Suboxone use prior to admission Also put on depakote 500mg  Q8h On clonazepam   Positive lupus anticoagulant  Hx of 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, DRVVT, 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  Pt likely needs long term anticoagulation once thrombocytopenia improves.  Anemia  Thrombocytopenia B12 deficiency  Iron deficiency  Hb 7.6->7.9 B12 = 122, folate WNL Platelet 54->44->48 On iron infusion and B12 supplement CBC monitoring  Fever UTI Positive RPR Tmax 102.2->102.3->101.1->100.7 Tachycardia with low BP, need to monitor for sepsis/septic shock UA WBC 21-50, s/p one dose of rocephin Blood Cx pending Urine Cx pending RPR again positive 1:1 Not LP candidate given thrombocytopenia  CCM on board  BP management Home meds: None BP on the lower end Close monitoring  Lipid management Home meds: None LDL 42, goal < 70 High intensity statin not indicated as LDL below goal  Tobacco Abuse Patient smokes cigars Will discuss tobacco cessation when patient is alert and oriented  Substance Abuse Patient uses Suboxone UDS negative Will discuss substance  abuse and cessation when patient is alert and oriented  Dysphagia Patient has post-stroke dysphagia SLP consulted Currently NPO On TF @ 55  Other Stroke Risk Factors ETOH use, alcohol level 14, advised patient to drink no more than 1 drink a day  Other Active Problems Leukocytosis WBC 15.9->11.5->8.1  Hospital day # 4  Patient seen by NP with MD, MD to edit note as needed. Cortney E Ernestina Columbia , MSN, AGACNP-BC [facility] See Amion for schedule and pager information 02/25/2023 1:16 PM  ATTENDING NOTE: I reviewed above note and agree with the assessment and plan. Pt was seen and examined.   Girlfriend is at the bedside. Pt lying in bed, lethargic, but slowly open eyes on voice. No agitation on exam but nonverbal and not following commands. Eyes left gaze preference, did not cross midline. Blinking to visual threat on the left but not on the right. Right facial droop with right UE and LE flaccid. RUE and RLE against gravity without drift. Sensation, coordination and gait not tested.  Pt has multiple medical conditions including positive lupus anticoagulant, hx of PE, anemia, thrombocytopenia, B12 deficiency, UTI, fever, substance abuse and dysphagia. Her clinical deficit and  agitation seems more than the MRI showing left M2 stroke, but can be due to stroke with complicated medical issues but low suspicious to repeat MRI. Now on ASA, pending full AC once thrombocytopenia improves. Fever, leukocytosis, agitation, AMS, tachycardia with low BP concerning for sepsis, septic shock, discussed with CCM, she had one dose of rocephin for mild UTI. Will need close monitoring. Continue TF for dysphagia. Currently agitation has improved. PT and OT recommend CIR.  For detailed assessment and plan, please refer to above/below as I have made changes wherever appropriate.   Marvel Plan, MD PhD Stroke Neurology 02/25/2023 2:05 PM   This patient is critically ill due to stroke with failed  IR, fever, anemia, thromocytopenia, lupus anticoagulant, hx of PE and at significant risk of neurological worsening, death form recurrent stroke, sepsis, septic shock, recurrent PE/embolism, bleeding. This patient's care requires constant monitoring of vital signs, hemodynamics, respiratory and cardiac monitoring, review of multiple databases, neurological assessment, discussion with family, other specialists and medical decision making of high complexity. I spent 55 minutes of neurocritical care time in the care of this patient. I had long discussion with girlfriend at bedside, updated pt current condition, treatment plan and potential prognosis, and answered all the questions. She expressed understanding and appreciation. I discussed with CCM Dr. Denese Killings.      To contact Stroke Continuity provider, please refer to [email or web address]. After hours, contact General Neurology

## 2023-02-25 NOTE — Plan of Care (Signed)
progressing 

## 2023-02-25 NOTE — Plan of Care (Signed)
Progressing toward goals. 

## 2023-02-26 ENCOUNTER — Inpatient Hospital Stay (HOSPITAL_COMMUNITY): Payer: Medicaid Other

## 2023-02-26 DIAGNOSIS — I639 Cerebral infarction, unspecified: Secondary | ICD-10-CM

## 2023-02-26 DIAGNOSIS — I63512 Cerebral infarction due to unspecified occlusion or stenosis of left middle cerebral artery: Secondary | ICD-10-CM | POA: Diagnosis not present

## 2023-02-26 LAB — CBC WITH DIFFERENTIAL/PLATELET
Abs Immature Granulocytes: 0.04 10*3/uL (ref 0.00–0.07)
Basophils Absolute: 0 10*3/uL (ref 0.0–0.1)
Basophils Relative: 0 %
Eosinophils Absolute: 0.1 10*3/uL (ref 0.0–0.5)
Eosinophils Relative: 2 %
HCT: 25.1 % — ABNORMAL LOW (ref 36.0–46.0)
Hemoglobin: 8 g/dL — ABNORMAL LOW (ref 12.0–15.0)
Immature Granulocytes: 1 %
Lymphocytes Relative: 22 %
Lymphs Abs: 1.6 10*3/uL (ref 0.7–4.0)
MCH: 22.8 pg — ABNORMAL LOW (ref 26.0–34.0)
MCHC: 31.9 g/dL (ref 30.0–36.0)
MCV: 71.5 fL — ABNORMAL LOW (ref 80.0–100.0)
Monocytes Absolute: 0.9 10*3/uL (ref 0.1–1.0)
Monocytes Relative: 12 %
Neutro Abs: 4.6 10*3/uL (ref 1.7–7.7)
Neutrophils Relative %: 63 %
Platelets: 59 10*3/uL — ABNORMAL LOW (ref 150–400)
RBC: 3.51 MIL/uL — ABNORMAL LOW (ref 3.87–5.11)
RDW: 20.7 % — ABNORMAL HIGH (ref 11.5–15.5)
WBC: 7.2 10*3/uL (ref 4.0–10.5)
nRBC: 0.4 % — ABNORMAL HIGH (ref 0.0–0.2)

## 2023-02-26 LAB — GLUCOSE, CAPILLARY
Glucose-Capillary: 108 mg/dL — ABNORMAL HIGH (ref 70–99)
Glucose-Capillary: 109 mg/dL — ABNORMAL HIGH (ref 70–99)
Glucose-Capillary: 113 mg/dL — ABNORMAL HIGH (ref 70–99)
Glucose-Capillary: 114 mg/dL — ABNORMAL HIGH (ref 70–99)
Glucose-Capillary: 116 mg/dL — ABNORMAL HIGH (ref 70–99)
Glucose-Capillary: 148 mg/dL — ABNORMAL HIGH (ref 70–99)

## 2023-02-26 LAB — BPAM RBC
Blood Product Expiration Date: 202501022359
Blood Product Expiration Date: 202501192359
Unit Type and Rh: 6200
Unit Type and Rh: 6200

## 2023-02-26 LAB — BASIC METABOLIC PANEL
Anion gap: 10 (ref 5–15)
BUN: 17 mg/dL (ref 6–20)
CO2: 21 mmol/L — ABNORMAL LOW (ref 22–32)
Calcium: 8.8 mg/dL — ABNORMAL LOW (ref 8.9–10.3)
Chloride: 110 mmol/L (ref 98–111)
Creatinine, Ser: 0.71 mg/dL (ref 0.44–1.00)
GFR, Estimated: >60 mL/min (ref >60)
Glucose, Bld: 120 mg/dL — ABNORMAL HIGH (ref 70–99)
Potassium: 3.7 mmol/L (ref 3.5–5.1)
Sodium: 141 mmol/L (ref 135–145)

## 2023-02-26 LAB — TYPE AND SCREEN
ABO/RH(D): A POS
Antibody Screen: POSITIVE
DAT, IgG: POSITIVE
Unit division: 0
Unit division: 0
Unit division: 0
Unit division: 0

## 2023-02-26 LAB — PHOSPHORUS
Phosphorus: 4.9 mg/dL — ABNORMAL HIGH (ref 2.5–4.6)
Phosphorus: 4.4 mg/dL (ref 2.5–4.6)

## 2023-02-26 LAB — METHYLMALONIC ACID, SERUM: Methylmalonic Acid, Quantitative: 73 nmol/L (ref 0–378)

## 2023-02-26 LAB — MAGNESIUM
Magnesium: 2.1 mg/dL (ref 1.7–2.4)
Magnesium: 2.2 mg/dL (ref 1.7–2.4)

## 2023-02-26 LAB — HEPARIN LEVEL (UNFRACTIONATED): Heparin Unfractionated: 0.12 [IU]/mL — ABNORMAL LOW (ref 0.30–0.70)

## 2023-02-26 MED ORDER — SENNOSIDES-DOCUSATE SODIUM 8.6-50 MG PO TABS
1.0000 | ORAL_TABLET | Freq: Every evening | ORAL | Status: DC | PRN
  Administered 2023-02-27: 1
  Filled 2023-02-26: qty 1

## 2023-02-26 MED ORDER — LABETALOL HCL 5 MG/ML IV SOLN: 10.0000 mg | INTRAVENOUS | Status: DC | PRN

## 2023-02-26 MED ORDER — VALPROIC ACID 250 MG/5ML PO SOLN
500.0000 mg | Freq: Three times a day (TID) | ORAL | Status: DC
  Administered 2023-02-26 – 2023-03-02 (×12): 500 mg
  Filled 2023-02-26 (×12): qty 10

## 2023-02-26 MED ORDER — HEPARIN (PORCINE) 25000 UT/250ML-% IV SOLN
1150.0000 [IU]/h | INTRAVENOUS | Status: DC
  Administered 2023-02-26: 800 [IU]/h via INTRAVENOUS
  Administered 2023-02-27: 1150 [IU]/h via INTRAVENOUS
  Administered 2023-02-28 – 2023-03-06 (×7): 1200 [IU]/h via INTRAVENOUS
  Administered 2023-03-07 – 2023-03-14 (×7): 1150 [IU]/h via INTRAVENOUS
  Filled 2023-02-26 (×18): qty 250

## 2023-02-26 MED ORDER — SODIUM CHLORIDE 0.9 % IV SOLN
2.0000 g | INTRAVENOUS | Status: AC
  Administered 2023-02-26 – 2023-03-01 (×4): 2 g via INTRAVENOUS
  Filled 2023-02-26 (×4): qty 20

## 2023-02-26 MED ORDER — QUETIAPINE FUMARATE 25 MG PO TABS
50.0000 mg | ORAL_TABLET | Freq: Two times a day (BID) | ORAL | Status: DC
  Administered 2023-02-26 – 2023-02-27 (×2): 50 mg
  Filled 2023-02-26 (×2): qty 2

## 2023-02-26 MED ORDER — MIDAZOLAM HCL 2 MG/2ML IJ SOLN
4.0000 mg | Freq: Once | INTRAMUSCULAR | Status: AC | PRN
  Administered 2023-02-26: 4 mg via INTRAVENOUS
  Filled 2023-02-26: qty 4

## 2023-02-26 MED ORDER — DEXMEDETOMIDINE HCL IN NACL 400 MCG/100ML IV SOLN
0.0000 ug/kg/h | INTRAVENOUS | Status: DC
  Administered 2023-02-26: 0.5 ug/kg/h via INTRAVENOUS
  Administered 2023-02-26: 0.4 ug/kg/h via INTRAVENOUS
  Administered 2023-03-01 (×2): 1.2 ug/kg/h via INTRAVENOUS
  Administered 2023-03-01: 0.4 ug/kg/h via INTRAVENOUS
  Administered 2023-03-02: 1.1 ug/kg/h via INTRAVENOUS
  Administered 2023-03-02: 0.8 ug/kg/h via INTRAVENOUS
  Administered 2023-03-02: 0.7 ug/kg/h via INTRAVENOUS
  Administered 2023-03-02: 1.2 ug/kg/h via INTRAVENOUS
  Administered 2023-03-03: 0.8 ug/kg/h via INTRAVENOUS
  Filled 2023-02-26 (×10): qty 100

## 2023-02-26 MED ORDER — DIVALPROEX SODIUM 500 MG PO DR TAB
500.0000 mg | DELAYED_RELEASE_TABLET | Freq: Three times a day (TID) | ORAL | Status: DC
  Filled 2023-02-26 (×3): qty 1

## 2023-02-26 MED ORDER — TRAMADOL HCL 50 MG PO TABS
50.0000 mg | ORAL_TABLET | Freq: Four times a day (QID) | ORAL | Status: DC | PRN
  Administered 2023-02-27 – 2023-03-14 (×45): 50 mg
  Filled 2023-02-26 (×48): qty 1

## 2023-02-26 NOTE — Progress Notes (Signed)
eLink Physician-Brief Progress Note Patient Name: MYRAH MINOTTI DOB: Nov 26, 2000 MRN: [id number]   Date of Service  02/26/2023  HPI/Events of Note  Coretrack got displaced, not in position. Asking for new NG tube placement  Discussed with RN  eICU Interventions  Remove core track, ordered NG tube, follow KUB for placement.      Intervention Category Intermediate Interventions: Other:  Ranee Gosselin 02/26/2023, 4:17 AM

## 2023-02-26 NOTE — Progress Notes (Signed)
STROKE TEAM PROGRESS NOTE   BRIEF HPI [name] is a 22 y.o. 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  The night before presentation, she drank a significant amount of alcohol and took some Suboxone that was not prescribed to her.  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  She went to IR for mechanical thrombectomy, but this was unfortunately unsuccessful.  This morning, patient is noted to have continued expressive aphasia and right hemiparesis.  Patient had a significant episode of agitation this morning with combativeness requiring use of restraints and initiation of Precedex drip.  Per patient's boyfriend, she does not drink every day and does not typically use illicit drugs.  NIH on Admission 3   SIGNIFICANT HOSPITAL EVENTS 12/20-patient admitted, mechanical thrombectomy attempted but unsuccessful 12/21-significant episode of agitation requiring restraints and Precedex 12/24-off precedex 12/25-continue intermittent fever and agitation, on Abx and resume precedex   INTERIM HISTORY/SUBJECTIVE RN is at the bedside. Pt still has intermittent fever and agitation. Off precedex yesterday and overnight she pulled off cortrak. Will need to resume precedex and put in NG for meds and feeding. Will need repeat CT and do EEG   OBJECTIVE  CBC    Component Value Date/Time   WBC 7.2 02/26/2023 0516   RBC 3.51 (L) 02/26/2023 0516   HGB 8.0 (L) 02/26/2023 0516   HGB 10.7 (L) 05/21/2017 1026   HCT 25.1 (L) 02/26/2023 0516   HCT 32.4 (L) 05/21/2017 1026   PLT 59 (L) 02/26/2023 0516   PLT 118 (L) 05/21/2017 1026   MCV 71.5 (L) 02/26/2023 0516   MCV 82 05/21/2017 1026   MCH 22.8 (L) 02/26/2023 0516   MCHC 31.9 02/26/2023 0516   RDW 20.7 (H) 02/26/2023 0516   RDW 15.3 05/21/2017 1026   LYMPHSABS 1.6 02/26/2023 0516   LYMPHSABS 2.3 02/28/2017 1618   MONOABS 0.9 02/26/2023  0516   EOSABS 0.1 02/26/2023 0516   EOSABS 0.0 02/28/2017 1618   BASOSABS 0.0 02/26/2023 0516   BASOSABS 0.0 02/28/2017 1618    BMET    Component Value Date/Time   NA 141 02/26/2023 0516   K 3.7 02/26/2023 0516   CL 110 02/26/2023 0516   CO2 21 (L) 02/26/2023 0516   GLUCOSE 120 (H) 02/26/2023 0516   BUN 17 02/26/2023 0516   CREATININE 0.71 02/26/2023 0516   CALCIUM 8.8 (L) 02/26/2023 0516   GFRNONAA >60 02/26/2023 0516    IMAGING past 24 hours VAS [place] LOWER EXTREMITY VENOUS (DVT) Result Date: 02/26/2023  Lower Venous DVT Study Patient Name:  Jacqueline [name]  Date of Exam:   02/26/2023 Medical Rec #: [id number]         Accession #:    [id number] Date of Birth: September 03, 2000         Patient Gender: F Patient Age:   48 years Exam Location:  [facility] Procedure:      VAS [place] LOWER EXTREMITY VENOUS (DVT) Referring Phys: Dewitt Hoes DE LA TORRE --------------------------------------------------------------------------------  Indications: Stroke.  Risk Factors: Immobility past pregnancy. Limitations: Patient positioning. Comparison Study: No significant changes seen since prior exam 04/08/22 Performing Technologist: Shona Simpson  Examination Guidelines: A complete evaluation includes B-mode imaging, spectral Doppler, color Doppler, and power Doppler as needed of all accessible portions of each vessel. Bilateral testing is considered an integral part of a complete examination. Limited examinations for reoccurring indications may be performed as noted.  The reflux portion of the exam is performed with the patient in reverse Trendelenburg.  +---------+---------------+---------+-----------+----------+--------------+ RIGHT    CompressibilityPhasicitySpontaneityPropertiesThrombus Aging +---------+---------------+---------+-----------+----------+--------------+ CFV      Full           Yes      Yes                                  +---------+---------------+---------+-----------+----------+--------------+ SFJ      Full                                                        +---------+---------------+---------+-----------+----------+--------------+ FV Prox  Full                                                        +---------+---------------+---------+-----------+----------+--------------+ FV Mid   Full                                                        +---------+---------------+---------+-----------+----------+--------------+ FV DistalFull                                                        +---------+---------------+---------+-----------+----------+--------------+ PFV      Full                                                        +---------+---------------+---------+-----------+----------+--------------+ POP      Full           Yes      Yes                                 +---------+---------------+---------+-----------+----------+--------------+ PTV      Full                                                        +---------+---------------+---------+-----------+----------+--------------+ PERO     Full                                                        +---------+---------------+---------+-----------+----------+--------------+   +---------+---------------+---------+-----------+----------+--------------+ LEFT     CompressibilityPhasicitySpontaneityPropertiesThrombus Aging +---------+---------------+---------+-----------+----------+--------------+ CFV      Full           Yes  Yes                                 +---------+---------------+---------+-----------+----------+--------------+ SFJ      Full                                                        +---------+---------------+---------+-----------+----------+--------------+ FV Prox  Full                                                         +---------+---------------+---------+-----------+----------+--------------+ FV Mid   Full                                                        +---------+---------------+---------+-----------+----------+--------------+ FV DistalFull                                                        +---------+---------------+---------+-----------+----------+--------------+ PFV      Full                                                        +---------+---------------+---------+-----------+----------+--------------+ POP      Full           Yes      Yes                                 +---------+---------------+---------+-----------+----------+--------------+ PTV      Full                                                        +---------+---------------+---------+-----------+----------+--------------+ PERO     Full                                                        +---------+---------------+---------+-----------+----------+--------------+     Summary: BILATERAL: - No evidence of deep vein thrombosis seen in the lower extremities, bilaterally. -No evidence of popliteal cyst, bilaterally.   *See table(s) above for measurements and observations. Electronically signed by Gerarda Fraction on 02/26/2023 at 11:56:19 AM.    Final      Vitals:   02/26/23 1100 02/26/23 1200 02/26/23 1300 02/26/23 1400  BP: 121/70 (!) 141/111 120/77 110/64  Pulse: (!) 105 (!) 140 Marland Kitchen)  120 (!) 105  Resp: (!) 24 17 (!) 26 (!) 22  Temp:  99 F (37.2 C)    TempSrc:  Axillary    SpO2: 94% 98% 96% 95%  Weight:      Height:         PHYSICAL EXAM General: Drowsy, well-nourished, well-developed patient with intermittent agitation, yelling CV: Regular rhythm on monitor but tachycardia Respiratory:  Regular, unlabored respirations on room air  NEURO:  Intermittent agitation with yelling, still lethargic, but slowly open eyes on voice. Nonverbal and not following commands. Eyes left gaze preference,  barely cross midline. Blinking to visual threat on the left but not on the right. Right facial droop with right UE and LE flaccid. RUE and RLE spontaneous movement against gravity. Sensation, coordination and gait not tested.   ASSESSMENT/PLAN  Stroke:  left MCA territory infarct s/p unsuccessful IR, etiology: likely related to hypercoagulability from positive lupus anticoagulant not on [place] Code Stroke CT head left parietotemporal \\infarct  ASPECTS 7-8 CTA head & neck occluded left proximal to mid M2 MCA CT perfusion 21 mm penumbra in left parietotemporal region S/p IR - initially TICI3, but progressive worsening stenosis with subsequent complete closure of left M2. Intermittent mild improvement in cailber with verapamil and nitroglycerine.  MRI acute left MCA territory infarct without hemorrhagic conversion CT repeat pending 2D Echo EF 60 to 65%.  Left atrium size normal. LDL 42 HgbA1c 4.4 UDS neg VTE prophylaxis -SCDs No antithrombotic prior to admission, now on heparin IV given platelet has improved to > 50.  Therapy recommendations:  CIR Disposition: Pending  Episode of agitation, improved Patient had significant episode of agitation 12/23 with combativeness, requiring administration of Haldol and Versed Was on precedex -> off -> now back on precedex Per patient's significant other, she does not frequently drink or use illicit substances, but concern for withdrawal still remains given alcohol and Suboxone use prior to admission Also on depakote 500mg  Q8h On clonazepam 0.5 bid Increase seroquel to 50 bid  Positive lupus anticoagulant  Hx of 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, DRVVT, 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 - put on eliquis  Followed up with oncology/hematology in 07/2022 Per report, she stopped taking eliquis in 08/2022 Current admission repeat anti-cardiolipin IgM still elevated  Per hematology, can start anticoagulation  when platelet count is greater than 50, and recommend use of heparin or Lovenox given the possibility of worsening thrombocytopenia.  now on heparin IV given platelet has improved to > 50  Anemia  Thrombocytopenia B12 deficiency  Iron deficiency  Hb 7.6->7.9 B12 = 122, folate WNL Platelet 54->44->48->59 On iron infusion and B12 supplement CBC monitoring  Fever UTI Positive RPR Tmax 102.2->102.3->101.1->100.7 Tachycardia with low BP, need to monitor for sepsis/septic shock UA WBC 21-50, on rocephin now Blood Cx NGTD Urine Cx E. Coli RPR again positive 1:1, doubt  neurosyphillis given previous hx of RPR 1:1 Not LP candidate given thrombocytopenia  CCM on board  BP management Home meds: None BP stable now Close monitoring  Lipid management Home meds: None LDL 42, goal < 70 High intensity statin not indicated as LDL below goal  Tobacco Abuse Patient smokes cigars Will discuss tobacco cessation when patient is alert and oriented  Substance Abuse Patient uses Suboxone UDS negative Will discuss substance abuse and cessation when patient is alert and oriented  Dysphagia Patient has post-stroke dysphagia SLP consulted Currently NPO Cortrak-> pulled off -> NG tube On TF @ 55  Other Stroke Risk Factors ETOH use, alcohol level 14, advised patient to drink no more than 1 drink a day  Other Active Problems Leukocytosis WBC 15.9->11.5->8.1->7.2  Hospital day # 5   Marvel Plan, MD PhD Stroke Neurology 02/26/2023 2:24 PM   This patient is critically ill due to stroke with failed IR, fever, anemia, thromocytopenia, lupus anticoagulant, hx of PE and at significant risk of neurological worsening, death form recurrent stroke, sepsis, septic shock, recurrent PE/embolism, bleeding. This patient's care requires constant monitoring of vital signs, hemodynamics, respiratory and cardiac monitoring, review of multiple databases, neurological assessment, discussion with family,  other specialists and medical decision making of high complexity. I spent 35 minutes of neurocritical care time in the care of this patient. I discussed with CCM Dr. Denese Killings.      To contact Stroke Continuity provider, please refer to [email or web address]. After hours, contact General Neurology

## 2023-02-26 NOTE — Progress Notes (Signed)
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   No Known Allergies  Patient Measurements: Height: 5\' 4"  (162.6 cm) Weight: 72.6 kg (160 lb) IBW/kg (Calculated) : 54.7 Heparin Dosing Weight: 72 kg  Vital Signs: Temp: 99.3 F (37.4 C) (12/25 0800) Temp Source: Axillary (12/25 0800) BP: 112/75 (12/25 0800) Pulse Rate: 102 (12/25 0800)  Labs: Recent Labs    02/24/23 0428 02/25/23 0509 02/26/23 0516  HGB 7.6* 7.9* 8.0*  HCT 23.6* 24.7* 25.1*  PLT 44* 48* 59*  CREATININE 0.74 0.74 0.71    Estimated Creatinine Clearance: 106 mL/min (by C-G formula based on SCr of 0.71 mg/dL).   Medical History: Past Medical History:  Diagnosis Date   Bacterial vaginitis 07/10/2016   Candida vaginitis 07/24/2016   Headache in pregnancy, antepartum, third trimester 07/24/2016   [place] isoimmunization during pregnancy       Assessment: 22 YOF presented with acute CVA and MRI also shows endoluminal thrombus.  Patient has a history of unprovoked PE and she completed her course of Eliquis.  Platelet count > 50K, so Pharmacy consulted to start IV heparin.  No bleeding reported.  Goal of Therapy:  Heparin level 0.3-0.5 units/ml Monitor platelets by anticoagulation protocol: Yes   Plan:  No bolus Start IV heparin at 800 units/hr Check 6 hr heparin level Daily heparin level and CBC Monitor closely for s/sx of bleeding  Kenwood Rosiak D. Laney Potash, PharmD, BCPS, BCCCP 02/26/2023, 10:37 AM

## 2023-02-26 NOTE — Progress Notes (Signed)
Lower extremity venous duplex completed. Please see CV Procedures for preliminary results.  Shona Simpson, RVT 02/26/23 11:29 AM

## 2023-02-26 NOTE — Progress Notes (Addendum)
Rounded on the patient and the boyfriend at bedside aid she had been coughing and bringing up lots of mucus for the past about 15 minutes. I noticed her Cortrak was 20cm at the nares instead of 61cm noted at 12am assessment. The boyfriend said she had been tugging at it. Called elink doctor for a NGT order. Unfortunately, given the patient's h/o agitation and anxiety- this will not likely be a smooth.   0430- Patient calmer and less agitated without the cortrak. Not wanting to have to restart Precedex for NGT insertion. Will consult day team CCM and hopefully cortrak team can place another today.   9562: The patient has been intermittently restless all night. Not a single hour with consecutive sleep. Wakes up about every 15 min to 30 min crying out and her boyfriend at bedtime trying to console her. Unable to localize to the issue. Gave meds for pain per prn orders, meds for fever, scheduled meds and agitation/anxiety. While awake her HR would be as high as 145 progressively getting more sustained so I gave her labetalol once her BP allowed  for this.   Contacted pharmacy earlier to change PO meds to NGT route .

## 2023-02-26 NOTE — Progress Notes (Signed)
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   No Known Allergies  Patient Measurements: Height: 5\' 4"  (162.6 cm) Weight: 72.6 kg (160 lb) IBW/kg (Calculated) : 54.7 Heparin Dosing Weight: 72 kg  Vital Signs: Temp: 99.4 F (37.4 C) (12/25 2000) Temp Source: Axillary (12/25 2000) BP: 111/76 (12/25 2000) Pulse Rate: 100 (12/25 2000)  Labs: Recent Labs    12/23/Jacqueline 0428 12/Jacqueline/Jacqueline 0509 12/25/Jacqueline 0516 12/25/Jacqueline 1957  HGB 7.6* 7.9* 8.0*  --   HCT 23.6* Jacqueline.7* 25.1*  --   PLT 44* 48* 59*  --   HEPARINUNFRC  --   --   --  0.12*  CREATININE 0.74 0.74 0.71  --     Estimated Creatinine Clearance: 106 mL/min (by C-G formula based on SCr of 0.71 mg/dL).   Medical History: Past Medical History:  Diagnosis Date   Bacterial vaginitis 07/10/2016   Candida vaginitis 07/24/2016   Headache in pregnancy, antepartum, third trimester 07/24/2016   Kell isoimmunization during pregnancy       Assessment: Jacqueline [name] presented with acute CVA and MRI also shows endoluminal thrombus.  Patient has a history of unprovoked PE and she completed her course of Eliquis.  Platelet count > 50K, so Pharmacy consulted to start IV heparin.  No bleeding reported.  12/25 PM: Heparin level 0.12, subtherapeutic on 800 units/hr. No issue with heparin infusion running or signs of bleeding noted per RN.  Goal of Therapy:  Heparin level 0.3-0.5 units/ml Monitor platelets by anticoagulation protocol: Yes   Plan:  No bolus Increase to heparin 950 units/hr Check 6 hr heparin level Daily heparin level and CBC Monitor closely for s/sx of bleeding  Enos Fling, PharmD PGY-1 Acute Care Pharmacy Resident 02/26/2023 8:35 PM

## 2023-02-26 NOTE — Progress Notes (Signed)
Discussed with Neurology/Stroke team who will resume as primary attending of this patient; patient not ready to transfer out of ICU at this time. TRH to be re-consulted when patient is ready for transfer out of ICU.  Jacquelin Hawking, MD [facility] 02/26/2023, 12:05 PM

## 2023-02-26 NOTE — Progress Notes (Signed)
EEG complete - results pending 

## 2023-02-26 NOTE — Progress Notes (Signed)
Speech Language Pathology Treatment: Dysphagia  Patient Details Name: Jacqueline [name] MRN: [id number] DOB: 31-Oct-2000 Today's Date: 02/26/2023 Time: 6295-2841 SLP Time Calculation (min) (ACUTE ONLY): 13 min  Assessment / Plan / Recommendation Clinical Impression  SLP notified by RN that pt pulled out Cortrak and requested visit to determine if she can have po meds in applesauce. [name] was awake, right gaze preference, decreased eye contact and was unable to follow commands due to aphasia. SLP attempted to reposition head to midline and pt began to cry and eventually able to console and pt stopped crying. Attempted bites applesauce however pt clenched her teeth and would not allow spoon past her lips. With applesauce residue on her lips, several times she moved her lips but did not attempt to move applesauce into oral cavity. Max multimodal cues attempted that were unsuccessful and applesauce removed from lips. Discussed with MD and pt may have to have Precedex and restraints in order to have NGT for alternate means of nutrition. ST will continue efforts.    HPI HPI: The pt is a 22 yo female presenting 12/20 with expressive aphasia. 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 MRI showed fairly extensive acute left MCA territory and watershed territory infarcts within the left cerebral hemisphere as well as small acute R frontoparietal cortical/subcortical infarcts. Pt initially passed the Yale swallow screen but become more aphasic and started to cough with PO intake. 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      SLP Plan  Continue with current plan of care      Recommendations for follow up therapy are one component of a multi-disciplinary discharge planning process, led by the attending physician.  Recommendations may be updated based on patient status, additional functional  criteria and insurance authorization.    Recommendations  Diet recommendations: NPO Medication Administration: Via alternative means                  Oral care QID   Frequent or constant Supervision/Assistance Dysphagia, unspecified (R13.10)     Continue with current plan of care     Royce Macadamia  02/26/2023, 9:24 AM

## 2023-02-27 ENCOUNTER — Inpatient Hospital Stay (HOSPITAL_COMMUNITY): Payer: Medicaid Other

## 2023-02-27 DIAGNOSIS — R569 Unspecified convulsions: Secondary | ICD-10-CM

## 2023-02-27 DIAGNOSIS — I63512 Cerebral infarction due to unspecified occlusion or stenosis of left middle cerebral artery: Secondary | ICD-10-CM | POA: Diagnosis not present

## 2023-02-27 LAB — URINE CULTURE: Culture: 50000 — AB

## 2023-02-27 LAB — GLUCOSE, CAPILLARY
Glucose-Capillary: 113 mg/dL — ABNORMAL HIGH (ref 70–99)
Glucose-Capillary: 101 mg/dL — ABNORMAL HIGH (ref 70–99)
Glucose-Capillary: 104 mg/dL — ABNORMAL HIGH (ref 70–99)
Glucose-Capillary: 84 mg/dL (ref 70–99)
Glucose-Capillary: 109 mg/dL — ABNORMAL HIGH (ref 70–99)
Glucose-Capillary: 95 mg/dL (ref 70–99)

## 2023-02-27 LAB — T.PALLIDUM AB, TOTAL: T Pallidum Abs: NONREACTIVE

## 2023-02-27 LAB — BASIC METABOLIC PANEL
Anion gap: 10 (ref 5–15)
BUN: 23 mg/dL — ABNORMAL HIGH (ref 6–20)
CO2: 22 mmol/L (ref 22–32)
Calcium: 8.5 mg/dL — ABNORMAL LOW (ref 8.9–10.3)
Chloride: 109 mmol/L (ref 98–111)
Creatinine, Ser: 0.77 mg/dL (ref 0.44–1.00)
GFR, Estimated: >60 mL/min (ref >60)
Glucose, Bld: 119 mg/dL — ABNORMAL HIGH (ref 70–99)
Potassium: 3.6 mmol/L (ref 3.5–5.1)
Sodium: 141 mmol/L (ref 135–145)

## 2023-02-27 LAB — CBC
HCT: 23 % — ABNORMAL LOW (ref 36.0–46.0)
Hemoglobin: 7.4 g/dL — ABNORMAL LOW (ref 12.0–15.0)
MCH: 23.2 pg — ABNORMAL LOW (ref 26.0–34.0)
MCHC: 32.2 g/dL (ref 30.0–36.0)
MCV: 72.1 fL — ABNORMAL LOW (ref 80.0–100.0)
Platelets: 48 10*3/uL — ABNORMAL LOW (ref 150–400)
RBC: 3.19 MIL/uL — ABNORMAL LOW (ref 3.87–5.11)
RDW: 20.9 % — ABNORMAL HIGH (ref 11.5–15.5)
WBC: 6.3 10*3/uL (ref 4.0–10.5)
nRBC: 0.5 % — ABNORMAL HIGH (ref 0.0–0.2)

## 2023-02-27 LAB — HEPARIN LEVEL (UNFRACTIONATED)
Heparin Unfractionated: 0.14 [IU]/mL — ABNORMAL LOW (ref 0.30–0.70)
Heparin Unfractionated: 0.18 [IU]/mL — ABNORMAL LOW (ref 0.30–0.70)
Heparin Unfractionated: 0.27 [IU]/mL — ABNORMAL LOW (ref 0.30–0.70)

## 2023-02-27 MED ORDER — BISACODYL 10 MG RE SUPP: 10.0000 mg | Freq: Once | RECTAL | Status: DC

## 2023-02-27 MED ORDER — MIDAZOLAM HCL 2 MG/2ML IJ SOLN
4.0000 mg | Freq: Once | INTRAMUSCULAR | Status: DC
  Filled 2023-02-27 (×2): qty 4

## 2023-02-27 MED ORDER — MIDAZOLAM HCL 2 MG/2ML IJ SOLN: 4.0000 mg | Freq: Once | INTRAMUSCULAR | Status: DC

## 2023-02-27 MED ORDER — POLYETHYLENE GLYCOL 3350 17 G PO PACK
17.0000 g | PACK | Freq: Every day | ORAL | Status: DC
  Administered 2023-02-27 – 2023-03-25 (×23): 17 g
  Filled 2023-02-27 (×25): qty 1

## 2023-02-27 MED ORDER — ORAL CARE MOUTH RINSE
15.0000 mL | OROMUCOSAL | Status: DC
  Administered 2023-02-27 – 2023-03-14 (×54): 15 mL via OROMUCOSAL

## 2023-02-27 MED ORDER — QUETIAPINE FUMARATE 25 MG PO TABS
25.0000 mg | ORAL_TABLET | Freq: Two times a day (BID) | ORAL | Status: DC
  Administered 2023-02-28: 25 mg

## 2023-02-27 MED ORDER — ORAL CARE MOUTH RINSE: 15.0000 mL | OROMUCOSAL | Status: DC | PRN

## 2023-02-27 NOTE — Procedures (Signed)
Patient Name: CARLINDA TAPE  MRN: [id number]  Epilepsy Attending: Charlsie Quest  Referring Physician/Provider: Marvel Plan, MD  Date: 02/26/2023 Duration: 33.24 mins  Patient history: 22yo F with L MCA stroke and ams. EE to evaluate for seizure  Level of alertness: comatose/ lethargic   AEDs during EEG study: VPA, Clonazepam  Technical aspects: This EEG study was done with scalp electrodes positioned according to the 10-20 International system of electrode placement. Electrical activity was reviewed with band pass filter of 1-70Hz , sensitivity of 7 uV/mm, display speed of 67mm/sec with a 60Hz  notched filter applied as appropriate. EEG data were recorded continuously and digitally stored.  Video monitoring was available and reviewed as appropriate.  Description: EEG showed continuous generalized 3 to 6 Hz theta-delta slowing with overriding 15 to 18 Hz beta activity distributed symmetrically and diffusely. Hyperventilation and photic stimulation were not performed.     ABNORMALITY - Continuous slow, generalized - Excessive beta, generalized  IMPRESSION: This study is suggestive of severe diffuse encephalopathy likely related to sedation. No seizures or epileptiform discharges were seen throughout the recording.  Shlonda Dolloff Annabelle Harman

## 2023-02-27 NOTE — Progress Notes (Signed)
Speech Language Pathology Treatment: Dysphagia;Cognitive-Linquistic  Patient Details Name: Jacqueline [name] MRN: [id number] DOB: [birth year] Today's Date: 02/27/2023 Time: 1000-1015 SLP Time Calculation (min) (ACUTE ONLY): 15 min  Assessment / Plan / Recommendation Clinical Impression  Pt awake, but appears drowsy. Bedside RN off the unit, unsure about sedation etc. SLP and significant other provided oral care with hand over hand assist to brush teeth and suction mouth. Pt had some strong involuntary oral responses to this, but did not initiate or contribute. SLP then offered a cup with a straw filled with thick Sprite. Pt did not reach for cup or actively grasp with total assist. When straw placed, pt did not sip. SLP siphoned liquid to lips, pt did not lick lips to taste. With total assist open cup edge to mouth pt again did not initiate. Appears lethargic and possibly lightly sedated, but unsure. No facial expression, no distress. Discussed with family who had a toothbrush and toothpaste from mother at home that is it is ok to brush her teeth and use suction to clear if she is upright.    HPI HPI: The pt is a 22 yo female presenting 12/20 with expressive aphasia. 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 MRI showed fairly extensive acute left MCA territory and watershed territory infarcts within the left cerebral hemisphere as well as small acute R frontoparietal cortical/subcortical infarcts. Pt initially passed the Yale swallow screen but become more aphasic and started to cough with PO intake. 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      SLP Plan  Continue with current plan of care      Recommendations for follow up therapy are one component of a multi-disciplinary discharge planning process, led by the attending physician.  Recommendations may be updated based on patient  status, additional functional criteria and insurance authorization.    Recommendations  Diet recommendations: NPO                  Oral care QID   Frequent or constant Supervision/Assistance       Continue with current plan of care     Jacqueline [name], Jacqueline [name]  02/27/2023, 10:16 AM

## 2023-02-27 NOTE — Progress Notes (Signed)
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   No Known Allergies  Patient Measurements: Height: 5\' 4"  (162.6 cm) Weight: 58.6 kg (129 lb 3 oz) IBW/kg (Calculated) : 54.7 Heparin Dosing Weight: 72 kg  Vital Signs: Temp: 98.6 F (37 C) (12/26 2000) Temp Source: Axillary (12/26 2000) BP: 98/65 (12/26 2000) Pulse Rate: 98 (12/26 2000)  Labs: Recent Labs    02/25/23 0509 02/26/23 0516 02/26/23 1957 02/27/23 0509 02/27/23 1206 02/27/23 2043  HGB 7.9* 8.0*  --  7.4*  --   --   HCT 24.7* 25.1*  --  23.0*  --   --   PLT 48* 59*  --  48*  --   --   HEPARINUNFRC  --   --    < > 0.14* 0.18* 0.27*  CREATININE 0.74 0.71  --  0.77  --   --    < > = values in this interval not displayed.    Estimated Creatinine Clearance: 93.6 mL/min (by C-G formula based on SCr of 0.77 mg/dL).   Medical History: Past Medical History:  Diagnosis Date   Bacterial vaginitis 07/10/2016   Candida vaginitis 07/24/2016   Headache in pregnancy, antepartum, third trimester 07/24/2016   Kell isoimmunization during pregnancy     Assessment: 22 YOF presented with acute CVA and MRI also shows endoluminal thrombus.  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 Hx of thrombocytopenia. Pharmacy consulted to dose heparin.  Heparin level 0.18 (remains sub-therapeutic) on infusion at 1050 units/hr. No issues with line or bleeding reported per RN. Noted that plt down to 48 and Hgb down to 7.4 this morning. D/w Dr. Roda Shutters of neurology and okay to continue heparin for now.  PM: heparin level 0.27 (subtherapeutic) on heparin 1150 units/hr. Per RN, drip off briefly from approximately [phone] during MRI, but this should have minimal effect on level at 2043. No issues with the infusion or bleeding reported.  Goal of Therapy:  Heparin level 0.3-0.5 units/ml Monitor platelets by anticoagulation  protocol: Yes   Plan:  Increase to heparin 1200 units/hr Check 6 hr heparin level  Thank you for allowing pharmacy to participate in this patient's care.  Loralee Pacas, PharmD, BCPS Please see amion for complete clinical pharmacist phone list 02/27/2023,9:05 PM

## 2023-02-27 NOTE — Progress Notes (Signed)
STROKE TEAM PROGRESS NOTE   BRIEF HPI [name] is a 22 y.o. 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  The night before presentation, she drank a significant amount of alcohol and took some Suboxone that was not prescribed to her.  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  She went to IR for mechanical thrombectomy, but this was unfortunately unsuccessful.  This morning, patient is noted to have continued expressive aphasia and right hemiparesis.  Patient had a significant episode of agitation this morning with combativeness requiring use of restraints and initiation of Precedex drip.  Per patient's boyfriend, she does not drink every day and does not typically use illicit drugs.  NIH on Admission 3   SIGNIFICANT HOSPITAL EVENTS 12/20-patient admitted, mechanical thrombectomy attempted but unsuccessful 12/21-significant episode of agitation requiring restraints and Precedex 12/24-off precedex 12/25-continue intermittent fever and agitation, on Abx and resume precedex   INTERIM HISTORY/SUBJECTIVE RN is at the bedside. Pt still has intermittent fever and agitation. Off precedex yesterday and overnight she pulled off cortrak. Will need to resume precedex and put in NG for meds and feeding. Will need repeat CT and do EEG   OBJECTIVE  CBC    Component Value Date/Time   WBC 6.3 02/27/2023 0509   RBC 3.19 (L) 02/27/2023 0509   HGB 7.4 (L) 02/27/2023 0509   HGB 10.7 (L) 05/21/2017 1026   HCT 23.0 (L) 02/27/2023 0509   HCT 32.4 (L) 05/21/2017 1026   PLT 48 (L) 02/27/2023 0509   PLT 118 (L) 05/21/2017 1026   MCV 72.1 (L) 02/27/2023 0509   MCV 82 05/21/2017 1026   MCH 23.2 (L) 02/27/2023 0509   MCHC 32.2 02/27/2023 0509   RDW 20.9 (H) 02/27/2023 0509   RDW 15.3 05/21/2017 1026   LYMPHSABS 1.6 02/26/2023 0516   LYMPHSABS 2.3 02/28/2017 1618   MONOABS 0.9 02/26/2023  0516   EOSABS 0.1 02/26/2023 0516   EOSABS 0.0 02/28/2017 1618   BASOSABS 0.0 02/26/2023 0516   BASOSABS 0.0 02/28/2017 1618    BMET    Component Value Date/Time   NA 141 02/27/2023 0509   K 3.6 02/27/2023 0509   CL 109 02/27/2023 0509   CO2 22 02/27/2023 0509   GLUCOSE 119 (H) 02/27/2023 0509   BUN 23 (H) 02/27/2023 0509   CREATININE 0.77 02/27/2023 0509   CALCIUM 8.5 (L) 02/27/2023 0509   GFRNONAA >60 02/27/2023 0509    IMAGING past 24 hours EEG adult Result Date: 02/27/2023 Charlsie Quest, MD     02/27/2023  7:31 AM Patient Name: Jacqueline [name] MRN: [id number] Epilepsy Attending: Charlsie Quest Referring Physician/Provider: Marvel Plan, MD Date: 02/26/2023 Duration: 33.24 mins Patient history: 22yo F with L MCA stroke and ams. EE to evaluate for seizure Level of alertness: comatose/ lethargic AEDs during EEG study: VPA, Clonazepam Technical aspects: This EEG study was done with scalp electrodes positioned according to the 10-20 International system of electrode placement. Electrical activity was reviewed with band pass filter of 1-70Hz , sensitivity of 7 uV/mm, display speed of 62mm/sec with a 60Hz  notched filter applied as appropriate. EEG data were recorded continuously and digitally stored.  Video monitoring was available and reviewed as appropriate. Description: EEG showed continuous generalized 3 to 6 Hz theta-delta slowing with overriding 15 to 18 Hz beta activity distributed symmetrically and diffusely. Hyperventilation and photic stimulation were not performed.   ABNORMALITY - Continuous  slow, generalized - Excessive beta, generalized IMPRESSION: This study is suggestive of severe diffuse encephalopathy likely related to sedation. No seizures or epileptiform discharges were seen throughout the recording. Priyanka Annabelle Harman   CT HEAD WO CONTRAST ( ) Result Date: 02/26/2023 CLINICAL DATA:  Stroke, follow-up EXAM: CT HEAD WITHOUT CONTRAST TECHNIQUE: Contiguous axial images  were obtained from the base of the skull through the vertex without intravenous contrast. RADIATION DOSE REDUCTION: This exam was performed according to the departmental dose-optimization program which includes automated exposure control, adjustment of the mA and/or kV according to patient size and/or use of iterative reconstruction technique. COMPARISON:  02/23/2023 CT head FINDINGS: Brain: Increased hypodensity in known infarcts in the posterior left MCA territory and right posterior frontal lobe (series 4, image 23), with increased area of hypodensity in the anterior left temporal lobe (series 4, image 17), which was previously a smaller area of hypodensity. New hypodensity suspected in the anterior inferior right frontal lobe (series 4, image 14 and series 6, image 21). Additional possible hypodensity in the right cerebellar hemisphere (series 4, image 8), although this area is prone to artifact. No definite acute hemorrhage. No mass or midline shift. No hydrocephalus. Vascular: Redemonstrated hyperdense left M2 (series 4, image 11). No other hyperdense vessel. Skull: Negative for fracture or focal lesion. Sinuses/Orbits: No acute finding. Other: The mastoid air cells are well aerated. IMPRESSION: 1. 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 No evidence of frank hemorrhagic transformation. 2. New hypodensity suspected in the anterior inferior right frontal lobe and right cerebellum, which could represent additional acute infarct, although these areas are prone to artifact. Consider MRI for further evaluation. These results will be called to the ordering clinician or representative by the Radiologist Assistant, and communication documented in the PACS or [employer]. Electronically Signed   By: Wiliam Ke M.D.   On: 02/26/2023 23:40     Vitals:   02/27/23 0900 02/27/23 1000 02/27/23 1100 02/27/23 1200  BP:  105/62 114/67 113/65 107/64  Pulse: 93 (!) 102 (!) 107 (!) 102  Resp: (!) 24 (!) 21 (!) 23 20  Temp:    98.2 F (36.8 C)  TempSrc:    Axillary  SpO2: 96% 97% 97% 97%  Weight:      Height:         PHYSICAL EXAM General: Drowsy, well-nourished, well-developed patient with intermittent agitation, yelling CV: Regular rhythm on monitor but tachycardia Respiratory:  Regular, unlabored respirations on room air  NEURO:  Intermittent agitation with yelling, still lethargic, but slowly open eyes on voice. Nonverbal and not following commands. Eyes left gaze preference, barely cross midline. Blinking to visual threat on the left but not on the right. Right facial droop with right UE and LE flaccid. RUE and RLE spontaneous movement against gravity. Sensation, coordination and gait not tested.   ASSESSMENT/PLAN  Stroke:  left MCA territory infarct s/p unsuccessful IR with extension, etiology: likely related to hypercoagulability from positive lupus anticoagulant not on [facility] Code Stroke CT head left parietotemporal \\infarct  ASPECTS 7-8 CTA head & neck occluded left proximal to mid M2 MCA CT perfusion 21 mm penumbra in left parietotemporal region S/p IR - initially TICI3, but progressive worsening stenosis with subsequent complete closure of left M2. Intermittent mild improvement in cailber with verapamil and nitroglycerine.  MRI acute left MCA territory infarct without hemorrhagic conversion CT repeat likely stroke extension in the anterior inferior right frontal lobe and  right cerebellum MRI repeat pending 2D Echo EF 60 to 65%.  Left atrium size normal. EEG severe encephalopathy, no seizure LDL 42 HgbA1c 4.4 UDS neg VTE prophylaxis -SCDs No antithrombotic prior to admission, now on heparin IV.  Therapy recommendations:  CIR Disposition: Pending  Episode of agitation, improved Patient had significant episode of agitation 12/23 with combativeness, requiring administration of Haldol and  Versed Was on precedex -> off -> now back on precedex Per patient's significant other, she does not frequently drink or use illicit substances, but concern for withdrawal still remains given alcohol and Suboxone use prior to admission Also on depakote 500mg  Q8h On clonazepam 0.5 bid Increase seroquel to 50->25 bid  Positive lupus anticoagulant  Hx of 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, DRVVT, 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  now on heparin IV  Anemia  Thrombocytopenia B12 deficiency  Iron deficiency  Hb 8.4->7.6->7.9->7.4 B12 = 122, folate WNL Platelet 54->44->48->59->48 On iron infusion and B12 supplement CBC monitoring  Fever UTI Positive RPR Tmax 102.2->102.3->101.1->100.7->100.1 Tachycardia with low BP, need to monitor for sepsis/septic shock UA WBC 21-50, on rocephin now Blood Cx NGTD Urine Cx E. Coli RPR again positive 1:1, doubt neurosyphillis given previous hx of RPR 1:1 Not LP candidate given thrombocytopenia  CCM on board  BP management Home meds: None BP stable now Close monitoring  Lipid management Home meds: None LDL 42, goal < 70 High intensity statin not indicated as LDL below goal  Tobacco Abuse Patient smokes cigars Will discuss tobacco cessation when patient is alert and oriented  Substance Abuse Patient uses Suboxone UDS negative Will discuss substance abuse and cessation when patient is alert and oriented  Dysphagia Patient has post-stroke dysphagia SLP consulted Currently NPO Cortrak-> pulled off -> NG tube On TF @ 55  Other Stroke Risk  Factors ETOH use, alcohol level 14, advised patient to drink no more than 1 drink a day  Other Active Problems Leukocytosis WBC 15.9->11.5->8.1->7.2->6.3  Hospital day # 6   Marvel Plan, MD PhD Stroke Neurology 02/27/2023 1:44 PM   This patient is critically ill due to stroke with failed IR, stroke extension, fever, anemia, thromocytopenia, lupus anticoagulant, hx of PE and at significant risk of neurological worsening, death form recurrent stroke, brain herniation, sepsis, septic shock, recurrent PE/embolism, bleeding. This patient's care requires constant monitoring of vital signs, hemodynamics, respiratory and cardiac monitoring, review of multiple databases, neurological assessment, discussion with family, other specialists and medical decision making of high complexity. I spent 40 minutes of neurocritical care time in the care of this patient. I discussed with CCM Dr. Denese Killings. I had long discussion with boyfriend at bedside, updated pt current condition, treatment plan and potential prognosis, and answered all the questions. He expressed understanding and appreciation.       To contact Stroke Continuity provider, please refer to [email or web address]. After hours, contact General Neurology

## 2023-02-27 NOTE — Progress Notes (Signed)
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   No Known Allergies  Patient Measurements: Height: 5\' 4"  (162.6 cm) Weight: 58.6 kg (129 lb 3 oz) IBW/kg (Calculated) : 54.7 Heparin Dosing Weight: 72 kg  Vital Signs: Temp: 100.1 F (37.8 C) (12/26 0400) Temp Source: Axillary (12/26 0400) BP: 103/62 (12/26 0530) Pulse Rate: 101 (12/26 0530)  Labs: Recent Labs    02/25/23 0509 02/26/23 0516 02/26/23 1957 02/27/23 0509  HGB 7.9* 8.0*  --  7.4*  HCT 24.7* 25.1*  --  23.0*  PLT 48* 59*  --  48*  HEPARINUNFRC  --   --  0.12* 0.14*  CREATININE 0.74 0.71  --   --     Estimated Creatinine Clearance: 93.6 mL/min (by C-G formula based on SCr of 0.71 mg/dL).   Medical History: Past Medical History:  Diagnosis Date   Bacterial vaginitis 07/10/2016   Candida vaginitis 07/24/2016   Headache in pregnancy, antepartum, third trimester 07/24/2016   Kell isoimmunization during pregnancy     Assessment: 22 YOF presented with acute CVA and MRI also shows endoluminal thrombus.  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 Hx of thrombocytopenia. Pharmacy consulted to dose heparin.  Heparin level 0.14, sub-therapeutic  Heparin drip running appropriately 950 units/hr. No issues with bleeding noted by nurse.   Goal of Therapy:  Heparin level 0.3-0.5 units/ml Monitor platelets by anticoagulation protocol: Yes   Plan:  Increase to heparin 1050 units/hr Check 6 hr heparin level Daily heparin level and CBC Monitor closely for s/sx of bleeding  Thank you for allowing pharmacy to participate in this patient's care.  Marja Kays, PharmD Emergency Medicine Clinical Pharmacist 02/27/2023,6:21 AM

## 2023-02-27 NOTE — Progress Notes (Signed)
PT Cancellation Note  Patient Details Name: Jacqueline [name] MRN: [id number] DOB: Jun 04, 2000   Cancelled Treatment:    Reason Eval/Treat Not Completed: Patient at procedure or test/unavailable.  Will return and try to see as able. 02/27/2023  Jacinto Halim., PT Acute Rehabilitation Services [phone]  (office)   Jacqueline [name] 02/27/2023, 3:39 PM

## 2023-02-27 NOTE — Progress Notes (Signed)
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   No Known Allergies  Patient Measurements: Height: 5\' 4"  (162.6 cm) Weight: 58.6 kg (129 lb 3 oz) IBW/kg (Calculated) : 54.7 Heparin Dosing Weight: 72 kg  Vital Signs: Temp: 98.2 F (36.8 C) (12/26 1200) Temp Source: Axillary (12/26 1200) BP: 107/64 (12/26 1200) Pulse Rate: 102 (12/26 1200)  Labs: Recent Labs    02/25/23 0509 02/26/23 0516 02/26/23 1957 02/27/23 0509 02/27/23 1206  HGB 7.9* 8.0*  --  7.4*  --   HCT 24.7* 25.1*  --  23.0*  --   PLT 48* 59*  --  48*  --   HEPARINUNFRC  --   --  0.12* 0.14* 0.18*  CREATININE 0.74 0.71  --  0.77  --     Estimated Creatinine Clearance: 93.6 mL/min (by C-G formula based on SCr of 0.77 mg/dL).   Medical History: Past Medical History:  Diagnosis Date   Bacterial vaginitis 07/10/2016   Candida vaginitis 07/24/2016   Headache in pregnancy, antepartum, third trimester 07/24/2016   Kell isoimmunization during pregnancy     Assessment: 22 YOF presented with acute CVA and MRI also shows endoluminal thrombus.  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 Hx of thrombocytopenia. Pharmacy consulted to dose heparin.  Heparin level 0.18 (remains sub-therapeutic) on infusion at 1050 units/hr. No issues with line or bleeding reported per RN. Noted that plt down to 48 and Hgb down to 7.4 this morning. D/w Dr. Roda Shutters of neurology and okay to continue heparin for now.  Goal of Therapy:  Heparin level 0.3-0.5 units/ml Monitor platelets by anticoagulation protocol: Yes   Plan:  Increase to heparin 1150 units/hr Check 6 hr heparin level  Thank you for allowing pharmacy to participate in this patient's care.  Christoper Fabian, PharmD, BCPS Please see amion for complete clinical pharmacist phone list 02/27/2023,1:04 PM

## 2023-02-27 NOTE — Progress Notes (Signed)
Visitation expectations discussed with Aalisha Roehrig (mother) via phone. She understands while the patient is "private" only 2 visitors are allowed, no switching out. Designated visitor list and front desk updated.   2 visitors: Lavonia Dana (father) & Virgina Organ (fiance)

## 2023-02-28 ENCOUNTER — Inpatient Hospital Stay (HOSPITAL_COMMUNITY): Payer: Medicaid Other

## 2023-02-28 DIAGNOSIS — I63512 Cerebral infarction due to unspecified occlusion or stenosis of left middle cerebral artery: Secondary | ICD-10-CM | POA: Diagnosis not present

## 2023-02-28 LAB — HEPARIN LEVEL (UNFRACTIONATED): Heparin Unfractionated: 0.31 [IU]/mL (ref 0.30–0.70)

## 2023-02-28 LAB — CBC
HCT: 24.7 % — ABNORMAL LOW (ref 36.0–46.0)
Hemoglobin: 7.9 g/dL — ABNORMAL LOW (ref 12.0–15.0)
MCH: 23.4 pg — ABNORMAL LOW (ref 26.0–34.0)
MCHC: 32 g/dL (ref 30.0–36.0)
MCV: 73.3 fL — ABNORMAL LOW (ref 80.0–100.0)
Platelets: 57 10*3/uL — ABNORMAL LOW (ref 150–400)
RBC: 3.37 MIL/uL — ABNORMAL LOW (ref 3.87–5.11)
RDW: 21.9 % — ABNORMAL HIGH (ref 11.5–15.5)
WBC: 6.7 10*3/uL (ref 4.0–10.5)
nRBC: 0.4 % — ABNORMAL HIGH (ref 0.0–0.2)

## 2023-02-28 LAB — GLUCOSE, CAPILLARY
Glucose-Capillary: 88 mg/dL (ref 70–99)
Glucose-Capillary: 104 mg/dL — ABNORMAL HIGH (ref 70–99)
Glucose-Capillary: 115 mg/dL — ABNORMAL HIGH (ref 70–99)
Glucose-Capillary: 115 mg/dL — ABNORMAL HIGH (ref 70–99)
Glucose-Capillary: 106 mg/dL — ABNORMAL HIGH (ref 70–99)
Glucose-Capillary: 95 mg/dL (ref 70–99)

## 2023-02-28 MED ORDER — BUTALBITAL-APAP-CAFFEINE 50-325-40 MG PO TABS
1.0000 | ORAL_TABLET | Freq: Three times a day (TID) | ORAL | Status: DC | PRN
  Administered 2023-02-28 – 2023-03-14 (×15): 1
  Filled 2023-02-28 (×18): qty 1

## 2023-02-28 MED ORDER — QUETIAPINE FUMARATE 25 MG PO TABS
25.0000 mg | ORAL_TABLET | Freq: Two times a day (BID) | ORAL | Status: DC
  Administered 2023-02-28 – 2023-03-02 (×4): 25 mg
  Filled 2023-02-28 (×5): qty 1

## 2023-02-28 MED ORDER — ADULT MULTIVITAMIN LIQUID CH
15.0000 mL | Freq: Every day | ORAL | Status: DC
  Administered 2023-02-28 – 2023-03-25 (×26): 15 mL
  Filled 2023-02-28 (×27): qty 15

## 2023-02-28 NOTE — TOC CM/SW Note (Signed)
Transition of Care [facility]) - Inpatient Brief Assessment   Patient Details  Name: Jacqueline [name] MRN: [id number] Date of Birth: Feb 26, 2001  Transition of Care [facility]) CM/SW Contact:    Glennon Mac, RN Phone Number: 02/28/2023, 5:08 PM   Clinical Narrative: The pt is a 22 yo female presenting 12/20 with expressive aphasia. 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 Patient independent PTA and living with minor children. Family supportive and able to provide care at discharge. TOC will continue to follow progress.     Transition of Care Asessment: Insurance and Status: Insurance coverage has been reviewed Patient has primary care physician: Yes Home environment has been reviewed: Lives with minor children Prior level of function:: Independent Prior/Current Home Services: No current home services Social Drivers of Health Review: SDOH reviewed no interventions necessary Readmission risk has been reviewed: Yes Transition of care needs: transition of care needs identified, TOC will continue to follow  Quintella Baton, RN, BSN  Trauma/Neuro ICU Case Manager [phone]

## 2023-02-28 NOTE — TOC CAGE-AID Note (Signed)
Transition of Care [facility]) - CAGE-AID Screening   Patient Details  Name: Jacqueline [name] MRN: [id number] Date of Birth: 02/03/2001  Transition of Care [facility]) CM/SW Contact:    Mearl Latin, LCSW Phone Number: 02/28/2023, 11:03 AM   Clinical Narrative: Patient currently disoriented and unable to participate in CAGE screening.    CAGE-AID Screening: Substance Abuse Screening unable to be completed due to: : Patient unable to participate

## 2023-02-28 NOTE — Progress Notes (Signed)
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   No Known Allergies  Patient Measurements: Height: 5\' 4"  (162.6 cm) Weight: 77.1 kg (169 lb 15.6 oz) IBW/kg (Calculated) : 54.7 Heparin Dosing Weight: 72 kg  Vital Signs: Temp: 98.5 F (36.9 C) (12/27 0800) Temp Source: Axillary (12/27 0800) BP: 112/63 (12/27 0900) Pulse Rate: 98 (12/27 0900)  Labs: Recent Labs    02/26/23 0516 02/26/23 1957 02/27/23 0509 02/27/23 1206 02/27/23 2043 02/28/23 0634  HGB 8.0*  --  7.4*  --   --  7.9*  HCT 25.1*  --  23.0*  --   --  24.7*  PLT 59*  --  48*  --   --  57*  HEPARINUNFRC  --    < > 0.14* 0.18* 0.27* 0.31  CREATININE 0.71  --  0.77  --   --   --    < > = values in this interval not displayed.    Estimated Creatinine Clearance: 109 mL/min (by C-G formula based on SCr of 0.77 mg/dL).   Medical History: Past Medical History:  Diagnosis Date   Bacterial vaginitis 07/10/2016   Candida vaginitis 07/24/2016   Headache in pregnancy, antepartum, third trimester 07/24/2016   Kell isoimmunization during pregnancy     Assessment: 22 YOF presented with acute CVA and MRI also shows endoluminal thrombus.  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 Hx of thrombocytopenia. Pharmacy consulted to dose heparin.  Heparin level 0.31 (therapeutic) on infusion at 1200 units/hr. No issues with bleeding reported per RN. Noted that plt down to 57 and Hgb 7.9 (stable).   Goal of Therapy:  Heparin level 0.3-0.5 units/ml Monitor platelets by anticoagulation protocol: Yes   Plan:  Continue heparin at 1200 units/hr Daily heparin level and CBC  Thank you for allowing pharmacy to participate in this patient's care.  Christoper Fabian, PharmD, BCPS Please see amion for complete clinical pharmacist phone list 02/28/2023,9:32 AM

## 2023-02-28 NOTE — Progress Notes (Signed)
SLP Cancellation Note  Patient Details Name: Jacqueline [name] MRN: [id number] DOB: 01-12-2001   Cancelled treatment:       Reason Eval/Treat Not Completed: Other (comment) (RN asked to hold therapies)   Royce Macadamia 02/28/2023, 11:41 AM

## 2023-02-28 NOTE — Plan of Care (Signed)
  Problem: Ischemic Stroke/TIA Tissue Perfusion: Goal: Complications of ischemic stroke/TIA will be minimized Outcome: Progressing   Problem: Coping: Goal: Will verbalize positive feelings about self Outcome: Progressing   Problem: Self-Care: Goal: Ability to communicate needs accurately will improve Outcome: Progressing

## 2023-02-28 NOTE — Progress Notes (Signed)
STROKE TEAM PROGRESS NOTE   BRIEF HPI [name] is a 22 y.o. 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  The night before presentation, she drank a significant amount of alcohol and took some Suboxone that was not prescribed to her.  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  She went to IR for mechanical thrombectomy, but this was unfortunately unsuccessful.  This morning, patient is noted to have continued expressive aphasia and right hemiparesis.  Patient had a significant episode of agitation this morning with combativeness requiring use of restraints and initiation of Precedex drip.  Per patient's boyfriend, she does not drink every day and does not typically use illicit drugs.  NIH on Admission 3   SIGNIFICANT HOSPITAL EVENTS 12/20-patient admitted, mechanical thrombectomy attempted but unsuccessful 12/21-significant episode of agitation requiring restraints and Precedex 12/24-off precedex 12/25-continue intermittent fever and agitation, on Abx and resume precedex. Resumed heparin IV 12/26-repeat MRI showed stroke extension.   INTERIM HISTORY/SUBJECTIVE boyfriend is at the bedside. Pt still has intermittent mild agitation, likely due to HA. Afebrile. Repeat MRI showed stroke extension but no hemorrhagic conversion. Continue heparin IV for now. Switch NG tube to cortrak. Platelet up to 57 today  OBJECTIVE  CBC    Component Value Date/Time   WBC 6.7 02/28/2023 0634   RBC 3.37 (L) 02/28/2023 0634   HGB 7.9 (L) 02/28/2023 0634   HGB 10.7 (L) 05/21/2017 1026   HCT 24.7 (L) 02/28/2023 0634   HCT 32.4 (L) 05/21/2017 1026   PLT 57 (L) 02/28/2023 0634   PLT 118 (L) 05/21/2017 1026   MCV 73.3 (L) 02/28/2023 0634   MCV 82 05/21/2017 1026   MCH 23.4 (L) 02/28/2023 0634   MCHC 32.0 02/28/2023 0634   RDW 21.9 (H) 02/28/2023 0634   RDW 15.3 05/21/2017 1026    LYMPHSABS 1.6 02/26/2023 0516   LYMPHSABS 2.3 02/28/2017 1618   MONOABS 0.9 02/26/2023 0516   EOSABS 0.1 02/26/2023 0516   EOSABS 0.0 02/28/2017 1618   BASOSABS 0.0 02/26/2023 0516   BASOSABS 0.0 02/28/2017 1618    BMET    Component Value Date/Time   NA 141 02/27/2023 0509   K 3.6 02/27/2023 0509   CL 109 02/27/2023 0509   CO2 22 02/27/2023 0509   GLUCOSE 119 (H) 02/27/2023 0509   BUN 23 (H) 02/27/2023 0509   CREATININE 0.77 02/27/2023 0509   CALCIUM 8.5 (L) 02/27/2023 0509   GFRNONAA >60 02/27/2023 0509    IMAGING past 24 hours DG Abd 1 View Result Date: 02/28/2023 CLINICAL DATA:  Feeding tube placement. EXAM: ABDOMEN - 1 VIEW COMPARISON:  Abdominal x-ray dated February 26, 2023. FINDINGS: Enteric tube tip just inside the stomach with the distal side port in the distal esophagus. Normal bowel gas pattern. No acute osseous abnormality. IMPRESSION: 1. Enteric tube tip just inside the stomach with the distal side port in the distal esophagus. Recommend advancement. Electronically Signed   By: Obie Dredge M.D.   On: 02/28/2023 10:41   MR BRAIN WO CONTRAST Result Date: 02/27/2023 CLINICAL DATA:  Stroke, follow-up EXAM: MRI HEAD WITHOUT CONTRAST TECHNIQUE: Multiplanar, multiecho pulse sequences of the brain and surrounding structures were obtained without intravenous contrast. COMPARISON:  02/22/2023 MRI head, 02/26/2023 CT head FINDINGS: Brain: Increased size of areas of restricted diffusion and additional new areas of involvement in the left posterior frontal and parietal lobes (series 2, image 39, compared to  series 2, image 39 on the 02/22/2023 exam). Additional increased involvement is also noted in the anterior left frontal lobe (series 2, images 26-36), and right frontal lobe in the watershed territory (series 2, image 37). New restricted diffusion with ADC correlate in the left midbrain and cerebral peduncle, extending into the thalamus and posterior limb of the internal capsule  (series 2, images 19-23). Additional punctate acute infarcts in the left occipital lobe (series 2, image 22), left frontal lobe (series 2, image 44), and medial left parietal lobe (series 2, images 40 and 42). These areas are associated with increased T2 hyperintense signal, with gyral swelling and associated mass effect from the larger areas in the left posterior frontal, left temporal, and left parietal lobe, but no more than 3 mm of left-to-right midline shift. On susceptibility weighted imaging, susceptibility in the left MCA is likely related to thrombus, with additional new involvement of a more posterior left MCA branch (series 7, image 59). More punctate foci of hemosiderin deposition are associated with posterior right frontal lobe infarct, left frontal lobe infarct, and posterior left temporal lobe infarct (series 7, images 49, 65, 71, and 72), which may reflect microhemorrhage versus thrombus. No evidence of hemorrhagic conversion. The basilar cisterns remain patent. Unchanged low lying cerebellar tonsils, which extend 2 mm past the foramen magnum, unchanged pituitary within normal limits. Suspected hypodensity on the 10/14/2023 CT head in the right frontal lobe and right cerebellum was artifactual. Vascular: Normal proximal arterial flow voids. Loss of some of the more distal left MCA flow voids. Skull and upper cervical spine: Normal marrow signal. Sinuses/Orbits: Mucosal thickening in the maxillary sinuses and ethmoid air cells. No acute finding in the orbits. Other: Fluid in the left-greater-than-right mastoid air cells. IMPRESSION: 1. Increased acute infarcts in the left posterior frontal and parietal lobes, anterior left frontal lobe, and right frontal lobe, and additional punctate acute infarcts in the left occipital lobe, left frontal lobe, and medial left parietal lobe. 2. New restricted diffusion in the left midbrain, cerebral peduncle, thalamus, and posterior limb of the internal capsule may  reflect early changes of wallerian degeneration. 3. Mildly increased mass effect from the larger areas in the left posterior frontal, left temporal, and left parietal lobe, with no more than 3 mm of left-to-right midline shift. 4. Susceptibility in the left MCA is concerning for thrombus, with additional new involvement of a more posterior left MCA branch. More punctate foci of hemosiderin deposition are associated with the aforementioned infarcts, which may reflect microhemorrhage versus thromboemboli. No evidence of hemorrhagic conversion. These results will be called to the ordering clinician or representative by the Radiologist Assistant, and communication documented in the PACS or [employer]. Electronically Signed   By: Wiliam Ke M.D.   On: 02/27/2023 20:22     Vitals:   02/28/23 0900 02/28/23 1000 02/28/23 1100 02/28/23 1200  BP: 112/63 101/79 110/69 (!) 120/99  Pulse: 98  95 (!) 135  Resp: (!) 22  20 20   Temp:    99.4 F (37.4 C)  TempSrc:    Axillary  SpO2: 97% 97% 98% 97%  Weight:      Height:         PHYSICAL EXAM General: Drowsy, well-nourished, well-developed patient with intermittent agitation, yelling CV: Regular rhythm on monitor but tachycardia Respiratory:  Regular, unlabored respirations on room air  NEURO:  Intermittent agitation with yelling, still lethargic, but slowly open eyes on voice. Nonverbal and not following commands. Eyes left gaze preference, barely  cross midline. Blinking to visual threat on the left but not on the right. Right facial droop with right UE and LE flaccid. RUE and RLE spontaneous movement against gravity. Sensation, coordination and gait not tested.   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 Intermittent mild improvement in cailber with verapamil and nitroglycerine.  MRI acute left MCA territory infarct without hemorrhagic conversion CT repeat likely stroke extension in the anterior inferior right frontal lobe and right cerebellum MRI repeat showed stroke extension but no hemorrhagic conversion. MLS mild < 3mm.  CT repeat pending 2D Echo EF 60 to 65%.  Left atrium size normal. EEG severe encephalopathy, no seizure LDL 42 HgbA1c 4.4 UDS neg VTE prophylaxis -SCDs No antithrombotic prior to admission, now on heparin IV.  Therapy recommendations:  CIR Disposition: Pending  Episode of agitation, improved Patient had significant episode of agitation 12/23 with combativeness, requiring administration of Haldol and Versed Was on precedex -> off -> now back on precedex Per patient's significant other, she does not frequently drink or use illicit substances, but concern for withdrawal still remains given alcohol and Suboxone use prior to admission On depakote 500mg  Q8h On clonazepam 0.5 bid -> d/c Seroquel to 50->25 bid  Positive lupus anticoagulant  Hx of 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, DRVVT, 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  now on heparin IV  Anemia  Thrombocytopenia B12 deficiency  Iron deficiency  Hb 8.4->7.6->7.9->7.4->7.9 B12 = 122, folate WNL Platelet 54->44->48->59->48->57 On iron infusion  and B12 supplement CBC monitoring  Fever UTI Positive RPR Tmax 102.2->102.3->101.1->100.7->100.1->afebrile Tachycardia with low BP, need to monitor for sepsis/septic shock UA WBC 21-50, on rocephin now Blood Cx NGTD Urine Cx E. Coli RPR again positive 1:1, but T. Pallidum Ab negative CCM on board  BP management Home meds: None BP stable now Close monitoring  Lipid management Home meds: None LDL 42, goal < 70 High intensity statin not indicated as LDL below goal  Tobacco Abuse Patient smokes cigars Will discuss tobacco cessation when patient is alert and oriented  Substance Abuse Patient uses Suboxone UDS negative Will discuss substance abuse and cessation when patient is alert and oriented  Dysphagia Patient has post-stroke dysphagia SLP consulted Currently NPO Cortrak-> pulled off -> NG tube -> cortrak On TF @ 55  Other Stroke Risk Factors ETOH use, alcohol level 14, advised patient to drink no more than 1 drink a day  Other Active Problems Leukocytosis WBC 15.9->11.5->8.1->7.2->6.3->6.7  Hospital day # 7   Marvel Plan, MD PhD Stroke Neurology 02/28/2023 1:03 PM   This patient is critically ill due to stroke with failed IR, stroke extension, fever, anemia, thromocytopenia, lupus anticoagulant, hx of PE and at significant risk of neurological worsening, death form recurrent stroke, brain herniation, sepsis, septic shock, recurrent PE/embolism, bleeding.  This patient's care requires constant monitoring of vital signs, hemodynamics, respiratory and cardiac monitoring, review of multiple databases, neurological assessment, discussion with family, other specialists and medical decision making of high complexity. I spent 35 minutes of neurocritical care time in the care of this patient. I had long discussion with boyfriend at bedside, updated pt current condition, treatment plan and potential prognosis, and answered all the questions. He expressed understanding and  appreciation.       To contact Stroke Continuity provider, please refer to [email or web address]. After hours, contact General Neurology

## 2023-02-28 NOTE — Progress Notes (Signed)
OT Cancellation Note  Patient Details Name: Jacqueline [name] MRN: [id number] DOB: 12-03-2000   Cancelled Treatment:    Reason Eval/Treat Not Completed: Patient not medically ready (RN requesting to hold)  Mateo Flow 02/28/2023, 11:07 AM

## 2023-02-28 NOTE — Progress Notes (Signed)
PT Cancellation Note  Patient Details Name: Jacqueline [name] MRN: [id number] DOB: 2000/08/15   Cancelled Treatment:    Reason Eval/Treat Not Completed: Patient not medically ready;Medical issues which prohibited therapy.  (RN requesting to hold)  02/28/2023  Jacinto Halim., PT Acute Rehabilitation Services [phone]  (office)   [name] 02/28/2023, 11:39 AM

## 2023-02-28 NOTE — Progress Notes (Addendum)
2030- MRI from day shift resulted, Dr. Wilford Corner paged about results and told about day shift RN's concerns about patient's neuro status. 2100- Dr. Wilford Corner at bedside and went over MRI with patient's fiance. Patient more somnolent so it was decided by this RN and Dr. Wilford Corner to hold the bedtime dose of seroquel and klonopin.  0130- patient screaming out constantly, non-consolable, grabbing head (possible pain?) PRN tramadol given and Dr. Wilford Corner paged. Suggested giving the patient's klonopin that we held at 2200 now.  0300- patient intermittently yelling out, but overall assessment, patient is calmer. Dr. Wilford Corner update, verbal order to give night time dose of seroquel now, time adjusted on future doses.

## 2023-02-28 NOTE — Plan of Care (Signed)
Overnight neurology note  MRI results notified by [facility] radiology and the patient's RN.  Agree with the MRI results. Increased acute infarcts in the left posterior frontal parietal lobes, anterior left frontal lobe and right frontal lobe along with additional punctate infarcts in the left occipital lobe left frontal lobe and medial left parietal lobe.  Neurosurgery diffusion in the left midbrain, cerebral peduncle, thalamus and posterior limb of the internal capsule.  Mildly increased mass effect from the large areas in the left posterior frontal and left frontal and left parietal lobe with no more than 3 mm left-to-right midline shift.  Susceptibility in the left MCA concerning for thrombus with additional new involvement of the more posterior left MCA branch.  More punctate foci of hemosiderin deposition are associated with the aforementioned infarcts and may reflect microhemorrhage versus thromboemboli.  No evidence of hemorrhagic conversion.   At this time, there is no need to start any hypertonic's.  Continue neurochecks as before.  She has been uncomfortable and somewhat agitated.  Try Klonopin and Seroquel as needed.  Stroke team to continue to follow  Milon Dikes, MD Neurologist

## 2023-02-28 NOTE — Progress Notes (Signed)
Pt's mother called requesting for updates on the pt. Updated provided following password given. Pt's mother starting being rude and belittle following my explanation of the pt's POC. My Chiropodist informed of situations and the MD given pt's mother's number to update her.

## 2023-02-28 NOTE — Progress Notes (Signed)
Patient's mother called and would like to have her other daughter and herself on visitor list. Explained we do not have a list but can remove her from private status to allow for more than 2 visitors during this admit. She is agreeable to this and understands we can not go back to private later as many visitors already know the password and her location at this time.

## 2023-02-28 NOTE — Progress Notes (Signed)
Pt's TF paused d/t NGT needing placement Xray.

## 2023-02-28 NOTE — Procedures (Signed)
Cortrak  Tube Type:  Cortrak - 43 inches Tube Location:  Right nare Initial Placement:  Stomach Secured by: Bridle Technique Used to Measure Tube Placement:  Marking at nare/corner of mouth Cortrak Secured At:  70 cm   Cortrak Tube Team Note:  Consult received to place a Cortrak feeding tube.   X-ray is required, abdominal x-ray has been ordered by the Cortrak team. Please confirm tube placement before using the Cortrak tube.   If the tube becomes dislodged please keep the tube and contact the Cortrak team at [email or web address] for replacement.  If after hours and replacement cannot be delayed, place a NG tube and confirm placement with an abdominal x-ray.    Betsey Holiday MS, RD, LDN If unable to be reached, please send secure chat to "RD inpatient" available from 8:00a-4:00p daily

## 2023-02-28 NOTE — Progress Notes (Addendum)
Pt screaming out, inconsolable. Pt s/p receiving prn Fioricet and scheduled Klonopin. Pt's agitation causing her HR to be 130-140. DR. Xu informed.

## 2023-02-28 NOTE — Progress Notes (Signed)
TF restarted at 1045.

## 2023-02-28 NOTE — Progress Notes (Signed)
Pt's NGT noted to be charted at 56 cm, but actual measurement during my assessment was 47 cm. Dr. Roda Shutters informed and KUB ordered.  Pt still somnolent Seroquel given at , but arousable. Dr. Roda Shutters informed of pt's NIH score going up by 6 now 5 d/t extremity weakness.

## 2023-03-01 ENCOUNTER — Inpatient Hospital Stay (HOSPITAL_COMMUNITY): Payer: Medicaid Other

## 2023-03-01 DIAGNOSIS — I63512 Cerebral infarction due to unspecified occlusion or stenosis of left middle cerebral artery: Secondary | ICD-10-CM | POA: Diagnosis not present

## 2023-03-01 LAB — CULTURE, BLOOD (ROUTINE X 2)
Culture: NO GROWTH
Culture: NO GROWTH
Special Requests: ADEQUATE

## 2023-03-01 LAB — CBC
HCT: 25.7 % — ABNORMAL LOW (ref 36.0–46.0)
Hemoglobin: 8.1 g/dL — ABNORMAL LOW (ref 12.0–15.0)
MCH: 23.3 pg — ABNORMAL LOW (ref 26.0–34.0)
MCHC: 31.5 g/dL (ref 30.0–36.0)
MCV: 73.9 fL — ABNORMAL LOW (ref 80.0–100.0)
Platelets: 54 10*3/uL — ABNORMAL LOW (ref 150–400)
RBC: 3.48 MIL/uL — ABNORMAL LOW (ref 3.87–5.11)
RDW: 22.8 % — ABNORMAL HIGH (ref 11.5–15.5)
WBC: 6.1 10*3/uL (ref 4.0–10.5)
nRBC: 0 % (ref 0.0–0.2)

## 2023-03-01 LAB — GLUCOSE, CAPILLARY
Glucose-Capillary: 103 mg/dL — ABNORMAL HIGH (ref 70–99)
Glucose-Capillary: 119 mg/dL — ABNORMAL HIGH (ref 70–99)
Glucose-Capillary: 129 mg/dL — ABNORMAL HIGH (ref 70–99)
Glucose-Capillary: 135 mg/dL — ABNORMAL HIGH (ref 70–99)
Glucose-Capillary: 142 mg/dL — ABNORMAL HIGH (ref 70–99)
Glucose-Capillary: 97 mg/dL (ref 70–99)

## 2023-03-01 LAB — HEPARIN LEVEL (UNFRACTIONATED): Heparin Unfractionated: 0.33 [IU]/mL (ref 0.30–0.70)

## 2023-03-01 MED ORDER — CLONAZEPAM 1 MG PO TABS
1.0000 mg | ORAL_TABLET | Freq: Two times a day (BID) | ORAL | Status: DC
  Filled 2023-03-01: qty 1

## 2023-03-01 MED ORDER — CLONAZEPAM 1 MG PO TABS
1.0000 mg | ORAL_TABLET | Freq: Two times a day (BID) | ORAL | Status: DC
  Administered 2023-03-01 – 2023-03-13 (×25): 1 mg
  Filled 2023-03-01 (×13): qty 1
  Filled 2023-03-01: qty 2
  Filled 2023-03-01 (×11): qty 1

## 2023-03-01 NOTE — Plan of Care (Signed)
  Problem: Ischemic Stroke/TIA Tissue Perfusion: Goal: Complications of ischemic stroke/TIA will be minimized Outcome: Progressing   Problem: Coping: Goal: Will verbalize positive feelings about self Outcome: Progressing   Problem: Nutrition: Goal: Dietary intake will improve Outcome: Progressing

## 2023-03-01 NOTE — Progress Notes (Signed)
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   No Known Allergies  Patient Measurements: Height: 5\' 4"  (162.6 cm) Weight: 76.1 kg (167 lb 12.3 oz) IBW/kg (Calculated) : 54.7 Heparin Dosing Weight: 72 kg  Vital Signs: Temp: 96 F (35.6 C) (12/28 1300) Temp Source: Axillary (12/28 1300) BP: 108/71 (12/28 1300) Pulse Rate: 81 (12/28 1300)  Labs: Recent Labs    02/27/23 0509 02/27/23 1206 02/27/23 2043 02/28/23 0634 03/01/23 0449  HGB 7.4*  --   --  7.9* 8.1*  HCT 23.0*  --   --  24.7* 25.7*  PLT 48*  --   --  57* 54*  HEPARINUNFRC 0.14*   < > 0.27* 0.31 0.33  CREATININE 0.77  --   --   --   --    < > = values in this interval not displayed.    Estimated Creatinine Clearance: 108.4 mL/min (by C-G formula based on SCr of 0.77 mg/dL).   Medical History: Past Medical History:  Diagnosis Date   Bacterial vaginitis 07/10/2016   Candida vaginitis 07/24/2016   Headache in pregnancy, antepartum, third trimester 07/24/2016   Kell isoimmunization during pregnancy     Assessment: 22 YOF presented with acute CVA and MRI also shows endoluminal thrombus.  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 Hx of thrombocytopenia. Pharmacy consulted to dose heparin.  Heparin level 0.33 (therapeutic) on infusion at 1200 units/hr. No issues with bleeding reported per RN. Noted that plt 55 and Hgb 8.1 (stable).   Goal of Therapy:  Heparin level 0.3-0.5 units/ml Monitor platelets by anticoagulation protocol: Yes   Plan:  Continue heparin at 1200 units/hr Daily heparin level and CBC  Thank you for allowing pharmacy to participate in this patient's care.  Christoper Fabian, PharmD, BCPS Please see amion for complete clinical pharmacist phone list 03/01/2023,1:40 PM

## 2023-03-01 NOTE — Progress Notes (Signed)
STROKE TEAM PROGRESS NOTE   BRIEF HPI Ms. Bindu A Plata is a 22 y.o. 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  The night before presentation, she drank a significant amount of alcohol and took some Suboxone that was not prescribed to her.  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  She went to IR for mechanical thrombectomy, but this was unfortunately unsuccessful.  This morning, patient is noted to have continued expressive aphasia and right hemiparesis.  Patient had a significant episode of agitation this morning with combativeness requiring use of restraints and initiation of Precedex drip.  Per patient's boyfriend, she does not drink every day and does not typically use illicit drugs.  NIH on Admission 3   SIGNIFICANT HOSPITAL EVENTS 12/20-patient admitted, mechanical thrombectomy attempted but unsuccessful 12/21-significant episode of agitation requiring restraints and Precedex 12/24-off precedex 12/25-continue intermittent fever and agitation, on Abx and resume precedex. Resumed heparin IV 12/26-repeat MRI showed stroke extension.   INTERIM HISTORY/SUBJECTIVE boyfriend is at the bedside. Pt was agitated this am, yelling and restless. Does not consistent with HA, but more frustration about her condition of aphasia and right hemiplegia. Will restart precedex and put back on clonazepam.   OBJECTIVE  CBC    Component Value Date/Time   WBC 6.1 03/01/2023 0449   RBC 3.48 (L) 03/01/2023 0449   HGB 8.1 (L) 03/01/2023 0449   HGB 10.7 (L) 05/21/2017 1026   HCT 25.7 (L) 03/01/2023 0449   HCT 32.4 (L) 05/21/2017 1026   PLT 54 (L) 03/01/2023 0449   PLT 118 (L) 05/21/2017 1026   MCV 73.9 (L) 03/01/2023 0449   MCV 82 05/21/2017 1026   MCH 23.3 (L) 03/01/2023 0449   MCHC 31.5 03/01/2023 0449   RDW 22.8 (H) 03/01/2023 0449   RDW 15.3 05/21/2017 1026   LYMPHSABS 1.6  02/26/2023 0516   LYMPHSABS 2.3 02/28/2017 1618   MONOABS 0.9 02/26/2023 0516   EOSABS 0.1 02/26/2023 0516   EOSABS 0.0 02/28/2017 1618   BASOSABS 0.0 02/26/2023 0516   BASOSABS 0.0 02/28/2017 1618    BMET    Component Value Date/Time   NA 141 02/27/2023 0509   K 3.6 02/27/2023 0509   CL 109 02/27/2023 0509   CO2 22 02/27/2023 0509   GLUCOSE 119 (H) 02/27/2023 0509   BUN 23 (H) 02/27/2023 0509   CREATININE 0.77 02/27/2023 0509   CALCIUM 8.5 (L) 02/27/2023 0509   GFRNONAA >60 02/27/2023 0509    IMAGING past 24 hours CT HEAD WO CONTRAST ( ) Result Date: 03/01/2023 CLINICAL DATA:  22 year old female status post code stroke presentation on 02/21/2023 with left MCA occlusion, infarcts. Evidence of progressive ischemia on MRI 02/27/2023. Subsequent encounter. EXAM: CT HEAD WITHOUT CONTRAST TECHNIQUE: Contiguous axial images were obtained from the base of the skull through the vertex without intravenous contrast. RADIATION DOSE REDUCTION: This exam was performed according to the departmental dose-optimization program which includes automated exposure control, adjustment of the mA and/or kV according to patient size and/or use of iterative reconstruction technique. COMPARISON:  Brain MRI 02/27/2023, head CT 02/26/2023, and earlier. FINDINGS: Brain: Confluent left hemisphere cytotoxic edema maximal in the middle and posterior left MCA territory is stable. Patchy additional anterior left MCA, right superior frontal gyrus infarcts are stable. No hemorrhagic transformation. Intracranial mass effect with 2-3 mm of rightward midline shift is stable. No ventriculomegaly. Basilar cisterns are stable, remain patent. Stable noncontrast CT appearance of  the brainstem and cerebellum. Vascular: Hyperdense left MCA on series 6, image 37. Skull: Intact, negative. Sinuses/Orbits: Visualized paranasal sinuses and mastoids are stable and well aerated. Other: Right nasoenteric tube in place. No acute orbit or scalp  soft tissue finding. IMPRESSION: 1. Stable left greater than right MCA territory cytotoxic edema. No malignant hemorrhagic transformation. No new vascular territory involvement from the recent MRI. 2. Stable mild intracranial mass effect with 2-3 mm of rightward midline shift. 3. 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 Electronically Signed   By: Odessa Fleming M.D.   On: 03/01/2023 06:26   DG Abd Portable 1V Result Date: 02/28/2023 CLINICAL DATA:  Feeding tube placement EXAM: PORTABLE ABDOMEN - 1 VIEW COMPARISON:  X-ray earlier 02/28/2023. FINDINGS: Interval removal of the previous NG tube. Dobbhoff tube seen with the tip crossing to the right and could be along the extreme distal stomach or very proximal duodenum. Limited x-ray for tube placement IMPRESSION: Dobbhoff tube seen with the tip crossing to the right and could be along the extreme distal stomach or very proximal duodenum. Electronically Signed   By: Karen Kays M.D.   On: 02/28/2023 13:25   DG Abd 1 View Result Date: 02/28/2023 CLINICAL DATA:  NG tube placement EXAM: ABDOMEN - 1 VIEW COMPARISON:  Earlier 02/28/2023. FINDINGS: Limited x-ray for tube placement of the upper abdomen has a tip overlying the upper stomach but side hole above the GE junction. Recommend this be advanced further into the stomach. Minimal visualization of bowel loops in the upper abdomen. IMPRESSION: Limited x-ray for tube placement has tip overlying the upper stomach but side hole above the GE junction. Recommend this be advanced further into the stomach. Electronically Signed   By: Karen Kays M.D.   On: 02/28/2023 13:24     Vitals:   03/01/23 0900 03/01/23 0946 03/01/23 1000 03/01/23 1100  BP: 110/68  115/89 94/72  Pulse: 86  (!) 120 85  Resp: 17  (!) 26 19  Temp:  98.7 F (37.1 C)    TempSrc:  Axillary    SpO2: 96%  98% 97%  Weight:      Height:         PHYSICAL EXAM General: Drowsy, well-nourished, well-developed patient with  intermittent agitation, yelling CV: Regular rhythm on monitor but tachycardia Respiratory:  Regular, unlabored respirations on room air  NEURO:  Intermittent agitation with yelling, still lethargic, but slowly open eyes on voice. Nonverbal and not following commands. Eyes left gaze preference, barely cross midline. Blinking to visual threat on the left but not on the right. Right facial droop with right UE and LE flaccid. RUE and RLE spontaneous movement against gravity. Sensation, coordination and gait not tested.   ASSESSMENT/PLAN  Stroke:  left MCA territory infarct s/p unsuccessful IR with extension, etiology: likely related to hypercoagulability from positive lupus anticoagulant not on [facility] Code Stroke CT head left parietotemporal \\infarct  ASPECTS 7-8 CTA head & neck occluded left proximal to mid M2 MCA CT perfusion 21 mm penumbra in left parietotemporal region S/p IR - initially TICI3, but progressive worsening stenosis with subsequent complete closure of left M2. Intermittent mild improvement in cailber with verapamil and nitroglycerine.  MRI acute left MCA territory infarct without hemorrhagic conversion CT repeat likely stroke extension in the anterior inferior right frontal lobe and right cerebellum MRI repeat showed stroke extension but no hemorrhagic conversion. MLS mild < 3mm.  CT repeat 12/28 showed stable large right MCA and small left MCA/ACA infarcts. MLS 2-27mm  2D Echo EF 60 to 65%.  Left atrium size normal. EEG severe encephalopathy, no seizure LDL 42 HgbA1c 4.4 UDS neg VTE prophylaxis -SCDs No antithrombotic prior to admission, now on heparin IV.  Therapy recommendations:  CIR Disposition: Pending  Agitation Patient had significant episode of agitation 12/23 with combativeness, requiring administration of Haldol and Versed Was on precedex -> off -> now back on precedex Per patient's significant other, she does not frequently drink or use illicit substances, but concern  for withdrawal still remains given alcohol and Suboxone use prior to admission On depakote 500mg  Q8h On clonazepam 0.5 bid -> d/c->1mg  bid Seroquel to 50->25 bid Check depakote level in am  Positive lupus anticoagulant  Hx of 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, DRVVT, 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  now on heparin IV  Anemia  Thrombocytopenia B12 deficiency  Iron deficiency  Hb 8.4->7.6->7.9->7.4->7.9->8.1 B12 = 122, folate WNL Platelet 54->44->48->59->48->57->54 On iron infusion and B12 supplement CBC monitoring  Fever UTI Positive RPR Tmax 102.2->102.3->101.1->100.7->100.1->afebrile Tachycardia with low BP, need to monitor for sepsis/septic shock UA WBC 21-50, on rocephin now Blood Cx NGTD Urine Cx E. Coli RPR again positive 1:1, but T. Pallidum Ab negative CCM on board  BP management Home meds: None BP stable now Close monitoring  Lipid management Home meds: None LDL 42, goal < 70 High intensity statin not indicated as LDL below goal  Tobacco Abuse Patient smokes cigars Will discuss tobacco cessation when patient is alert and oriented  Substance Abuse Patient uses Suboxone UDS negative Will discuss substance abuse and cessation when patient is alert and oriented  Dysphagia Patient has post-stroke dysphagia SLP consulted Currently NPO Cortrak-> pulled off -> NG tube -> cortrak On TF @ 55  Other Stroke Risk Factors ETOH use, alcohol level 14, advised patient to drink no more than 1 drink a day  Other Active Problems Leukocytosis WBC  15.9->11.5->8.1->7.2->6.3->6.7->6.1  Hospital day # 8   Marvel Plan, MD PhD Stroke Neurology 03/01/2023 11:17 AM   This patient is critically ill due to stroke with failed IR, stroke extension, fever, anemia, thromocytopenia, lupus anticoagulant, hx of PE and at significant risk of neurological worsening, death form recurrent stroke, brain herniation, sepsis, septic shock, recurrent PE/embolism, bleeding. This patient's care requires constant monitoring of vital signs, hemodynamics, respiratory and cardiac monitoring, review of multiple databases, neurological assessment, discussion with family, other specialists and medical decision making of high complexity. I spent 35 minutes of neurocritical care time in the care of this patient. I had long discussion with boyfriend at bedside, updated pt current condition, treatment plan and potential prognosis, and answered all the questions. He expressed understanding and appreciation. I also called pt mom yesterday and updated her in details.       To contact Stroke Continuity provider, please refer to [email or web address]. After hours, contact General Neurology

## 2023-03-01 NOTE — Progress Notes (Signed)
PT Cancellation Note  Patient Details Name: LETRICIA SOBIERAJ MRN: [id number] DOB: 04/17/2000   Cancelled Treatment:    Reason Eval/Treat Not Completed: Patient not medically ready;  Medical issues which prohibited therapy.  Pt is very agitated, going on precedex imminently.  Will check back Monday. 03/01/2023  Jacinto Halim., PT Acute Rehabilitation Services [phone]  (office)  [name] Glorie Dowlen 03/01/2023, 10:08 AM

## 2023-03-01 NOTE — Progress Notes (Signed)
Inpatient Rehab Admissions Coordinator Note:   Per therapy patient was screened for CIR candidacy by Kayde Warehime Luvenia Starch, CCC-SLP. At this time, pt has not yet attempted transfers. Pt also has not been medically appropriate for therapy the past 3 days. Pt may have potential to progress to becoming a potential CIR candidate. CIR admissions team will follow to monitor for progress and participation with therapies. A consult order will be placed if pt appears to be an appropriate candidate.   Wolfgang Phoenix, MS, CCC-SLP Admissions Coordinator [phone] 03/01/23 5:38 PM

## 2023-03-02 DIAGNOSIS — I63512 Cerebral infarction due to unspecified occlusion or stenosis of left middle cerebral artery: Secondary | ICD-10-CM | POA: Diagnosis not present

## 2023-03-02 LAB — GLUCOSE, CAPILLARY
Glucose-Capillary: 103 mg/dL — ABNORMAL HIGH (ref 70–99)
Glucose-Capillary: 115 mg/dL — ABNORMAL HIGH (ref 70–99)
Glucose-Capillary: 120 mg/dL — ABNORMAL HIGH (ref 70–99)
Glucose-Capillary: 124 mg/dL — ABNORMAL HIGH (ref 70–99)
Glucose-Capillary: 143 mg/dL — ABNORMAL HIGH (ref 70–99)
Glucose-Capillary: 97 mg/dL (ref 70–99)

## 2023-03-02 LAB — CBC
HCT: 26.8 % — ABNORMAL LOW (ref 36.0–46.0)
Hemoglobin: 8.5 g/dL — ABNORMAL LOW (ref 12.0–15.0)
MCH: 23.5 pg — ABNORMAL LOW (ref 26.0–34.0)
MCHC: 31.7 g/dL (ref 30.0–36.0)
MCV: 74.2 fL — ABNORMAL LOW (ref 80.0–100.0)
Platelets: 65 10*3/uL — ABNORMAL LOW (ref 150–400)
RBC: 3.61 MIL/uL — ABNORMAL LOW (ref 3.87–5.11)
RDW: 23.8 % — ABNORMAL HIGH (ref 11.5–15.5)
WBC: 8.3 10*3/uL (ref 4.0–10.5)
nRBC: 0 % (ref 0.0–0.2)

## 2023-03-02 LAB — BASIC METABOLIC PANEL
Anion gap: 9 (ref 5–15)
BUN: 24 mg/dL — ABNORMAL HIGH (ref 6–20)
CO2: 22 mmol/L (ref 22–32)
Calcium: 8.7 mg/dL — ABNORMAL LOW (ref 8.9–10.3)
Chloride: 105 mmol/L (ref 98–111)
Creatinine, Ser: 0.82 mg/dL (ref 0.44–1.00)
GFR, Estimated: >60 mL/min (ref >60)
Glucose, Bld: 117 mg/dL — ABNORMAL HIGH (ref 70–99)
Potassium: 4.2 mmol/L (ref 3.5–5.1)
Sodium: 136 mmol/L (ref 135–145)

## 2023-03-02 LAB — VALPROIC ACID LEVEL: Valproic Acid Lvl: 43 ug/mL — ABNORMAL LOW (ref 50.0–100.0)

## 2023-03-02 LAB — HEPARIN LEVEL (UNFRACTIONATED): Heparin Unfractionated: 0.36 [IU]/mL (ref 0.30–0.70)

## 2023-03-02 MED ORDER — VALPROIC ACID 250 MG/5ML PO SOLN
1000.0000 mg | Freq: Two times a day (BID) | ORAL | Status: DC
  Administered 2023-03-02 – 2023-03-04 (×4): 1000 mg
  Filled 2023-03-02 (×4): qty 20

## 2023-03-02 MED ORDER — MIDAZOLAM HCL 2 MG/2ML IJ SOLN
2.0000 mg | Freq: Once | INTRAMUSCULAR | Status: AC
  Administered 2023-03-02: 2 mg via INTRAVENOUS

## 2023-03-02 MED ORDER — ONDANSETRON HCL 4 MG/2ML IJ SOLN
4.0000 mg | Freq: Four times a day (QID) | INTRAMUSCULAR | Status: DC | PRN
  Administered 2023-03-02 – 2023-03-15 (×2): 4 mg via INTRAVENOUS
  Filled 2023-03-02 (×2): qty 2

## 2023-03-02 MED ORDER — MIDAZOLAM HCL 2 MG/2ML IJ SOLN
INTRAMUSCULAR | Status: AC
  Filled 2023-03-02: qty 2

## 2023-03-02 MED ORDER — QUETIAPINE FUMARATE 25 MG PO TABS
25.0000 mg | ORAL_TABLET | Freq: Two times a day (BID) | ORAL | Status: DC
  Administered 2023-03-02: 25 mg
  Filled 2023-03-02: qty 1

## 2023-03-02 MED ORDER — BISACODYL 10 MG RE SUPP
10.0000 mg | Freq: Once | RECTAL | Status: AC
  Administered 2023-03-02: 10 mg via RECTAL
  Filled 2023-03-02: qty 1

## 2023-03-02 NOTE — Progress Notes (Signed)
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   No Known Allergies  Patient Measurements: Height: 5\' 4"  (162.6 cm) Weight: 70.6 kg (155 lb 10.3 oz) IBW/kg (Calculated) : 54.7 Heparin Dosing Weight: 72 kg  Vital Signs: Temp: 99.1 F (37.3 C) (12/29 0000) Temp Source: Axillary (12/29 0000) BP: 114/73 (12/29 0700) Pulse Rate: 78 (12/29 0700)  Labs: Recent Labs    02/28/23 0634 03/01/23 0449 03/02/23 0411  HGB 7.9* 8.1* 8.5*  HCT 24.7* 25.7* 26.8*  PLT 57* 54* 65*  HEPARINUNFRC 0.31 0.33 0.36  CREATININE  --   --  0.82    Estimated Creatinine Clearance: 102 mL/min (by C-G formula based on SCr of 0.82 mg/dL).   Medical History: Past Medical History:  Diagnosis Date   Bacterial vaginitis 07/10/2016   Candida vaginitis 07/24/2016   Headache in pregnancy, antepartum, third trimester 07/24/2016   Kell isoimmunization during pregnancy     Assessment: 22 YOF presented with acute CVA and MRI also shows endoluminal thrombus.  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 Hx of thrombocytopenia. Pharmacy consulted to dose heparin.  Heparin level 0.36 (therapeutic) on infusion at 1200 units/hr. No issues with bleeding reported per RN. Noted that plt 65 and Hgb 8.5 (stable).   Goal of Therapy:  Heparin level 0.3-0.5 units/ml Monitor platelets by anticoagulation protocol: Yes   Plan:  Continue heparin at 1200 units/hr Daily heparin level and CBC  Thank you for allowing pharmacy to participate in this patient's care.   Greta Doom BS, PharmD, BCPS Clinical Pharmacist 03/02/2023 8:05 AM  Contact: [phone] after 3 PM  "Be curious, not judgmental..." -Debbora Dus

## 2023-03-02 NOTE — Progress Notes (Signed)
STROKE TEAM PROGRESS NOTE   BRIEF HPI [name] is a 22 y.o. 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  The night before presentation, she drank a significant amount of alcohol and took some Suboxone that was not prescribed to her.  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  She went to IR for mechanical thrombectomy, but this was unfortunately unsuccessful.  This morning, patient is noted to have continued expressive aphasia and right hemiparesis.  Patient had a significant episode of agitation this morning with combativeness requiring use of restraints and initiation of Precedex drip.  Per patient's boyfriend, she does not drink every day and does not typically use illicit drugs.  NIH on Admission 3   SIGNIFICANT HOSPITAL EVENTS 12/20-patient admitted, mechanical thrombectomy attempted but unsuccessful 12/21-significant episode of agitation requiring restraints and Precedex 12/24-off precedex 12/25-continue intermittent fever and agitation, on Abx and resume precedex. Resumed heparin IV 12/26-repeat MRI showed stroke extension.   INTERIM HISTORY/SUBJECTIVE boyfriend is at the bedside. Pt was again agitated this am, yelling and restless. The agitation is episodic, seems not related to HA but more likely frustration. Still on precedex, had clonazepam restarted yesterday. Depakote level low, increased depakote to 1000 bid. Also increase seroquel dose.   OBJECTIVE  CBC    Component Value Date/Time   WBC 8.3 03/02/2023 0411   RBC 3.61 (L) 03/02/2023 0411   HGB 8.5 (L) 03/02/2023 0411   HGB 10.7 (L) 05/21/2017 1026   HCT 26.8 (L) 03/02/2023 0411   HCT 32.4 (L) 05/21/2017 1026   PLT 65 (L) 03/02/2023 0411   PLT 118 (L) 05/21/2017 1026   MCV 74.2 (L) 03/02/2023 0411   MCV 82 05/21/2017 1026   MCH 23.5 (L) 03/02/2023 0411   MCHC 31.7 03/02/2023 0411   RDW 23.8 (H)  03/02/2023 0411   RDW 15.3 05/21/2017 1026   LYMPHSABS 1.6 02/26/2023 0516   LYMPHSABS 2.3 02/28/2017 1618   MONOABS 0.9 02/26/2023 0516   EOSABS 0.1 02/26/2023 0516   EOSABS 0.0 02/28/2017 1618   BASOSABS 0.0 02/26/2023 0516   BASOSABS 0.0 02/28/2017 1618    BMET    Component Value Date/Time   NA 136 03/02/2023 0411   K 4.2 03/02/2023 0411   CL 105 03/02/2023 0411   CO2 22 03/02/2023 0411   GLUCOSE 117 (H) 03/02/2023 0411   BUN 24 (H) 03/02/2023 0411   CREATININE 0.82 03/02/2023 0411   CALCIUM 8.7 (L) 03/02/2023 0411   GFRNONAA >60 03/02/2023 0411    IMAGING past 24 hours No results found.    Vitals:   03/02/23 0500 03/02/23 0600 03/02/23 0700 03/02/23 0800  BP: (!) 104/57 117/71 114/73 104/84  Pulse: 81 79 78 95  Resp: (!) 25 (!) 26 (!) 24 (!) 26  Temp:    98.7 F (37.1 C)  TempSrc:    Axillary  SpO2: 96% 96% 96% 98%  Weight:      Height:         PHYSICAL EXAM General: Drowsy, well-nourished, well-developed patient with intermittent agitation, yelling CV: Regular rhythm on monitor but tachycardia Respiratory:  Regular, unlabored respirations on room air  NEURO:  Intermittent agitation with yelling, still lethargic, but slowly open eyes on voice. Nonverbal and not following commands. Eyes left gaze preference, barely cross midline. Blinking to visual threat on the left but not on the right. Right facial droop with right UE and LE  flaccid. RUE and RLE spontaneous movement against gravity. Sensation, coordination and gait not tested.   ASSESSMENT/PLAN  Stroke:  left MCA territory infarct s/p unsuccessful IR with extension, etiology: likely related to hypercoagulability from positive lupus anticoagulant not on [facility] Code Stroke CT head left parietotemporal \\infarct  ASPECTS 7-8 CTA head & neck occluded left proximal to mid M2 MCA CT perfusion 21 mm penumbra in left parietotemporal region S/p IR - initially TICI3, but progressive worsening stenosis with subsequent  complete closure of left M2. Intermittent mild improvement in cailber with verapamil and nitroglycerine.  MRI acute left MCA territory infarct without hemorrhagic conversion CT repeat likely stroke extension in the anterior inferior right frontal lobe and right cerebellum MRI repeat showed stroke extension but no hemorrhagic conversion. MLS mild < 3mm.  CT repeat 12/28 showed stable large right MCA and small left MCA/ACA infarcts. MLS 2-63mm 2D Echo EF 60 to 65%.  Left atrium size normal. EEG severe encephalopathy, no seizure LDL 42 HgbA1c 4.4 UDS neg VTE prophylaxis -SCDs No antithrombotic prior to admission, now on heparin IV.  Therapy recommendations:  CIR Disposition: Pending  Agitation, episodic Patient had significant episode of agitation 12/23 with combativeness, requiring administration of Haldol and Versed Was on precedex -> off -> now back on precedex Per patient's significant other, she does not frequently drink or use illicit substances, but concern for withdrawal still remains given alcohol and Suboxone use prior to admission Depakote level 42 On depakote 500mg  Q8h -> 1000 bid On clonazepam 0.5 bid -> d/c->1mg  bid Seroquel to 50->25->50 bid  Positive lupus anticoagulant  Hx of 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, DRVVT, 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  now on heparin IV  Anemia  Thrombocytopenia B12 deficiency  Iron deficiency  Hb 8.4->7.6->7.9->7.4->7.9->8.1->8.5 B12 = 122, folate WNL Platelet 54->44->48->59->48->57->54->65 On iron infusion and B12 supplement CBC  monitoring  Fever UTI Tmax 102.2->102.3->101.1->100.7->100.1->afebrile Tachycardia with low BP, need to monitor for sepsis/septic shock UA WBC 21-50, Urine Cx E. Coli, finished rocephin course Blood Cx neg RPR again positive 1:1, but T. Pallidum Ab negative CCM on board  BP management Home meds: None BP stable now Close monitoring  Lipid management Home meds: None LDL 42, goal < 70 High intensity statin not indicated as LDL below goal  Tobacco Abuse Patient smokes cigars Will discuss tobacco cessation when patient is alert and oriented  Substance Abuse Patient uses Suboxone UDS negative Will discuss substance abuse and cessation when patient is alert and oriented  Dysphagia Patient has post-stroke dysphagia SLP consulted Currently NPO Cortrak-> pulled off -> NG tube -> cortrak On TF @ 55  Other Stroke Risk Factors ETOH use, alcohol level 14, advised patient to drink no more than 1 drink a day  Other Active Problems Leukocytosis WBC 15.9->11.5->8.1->7.2->6.3->6.7->6.1->8.3  Hospital day # 9   Marvel Plan, MD PhD Stroke Neurology 03/02/2023 10:55 AM   This patient is critically ill due to stroke with failed IR, stroke extension, fever, anemia, thromocytopenia, lupus anticoagulant, hx of PE and at significant risk of neurological worsening, death form recurrent stroke, brain herniation, sepsis, septic shock, recurrent PE/embolism, bleeding. This patient's care requires constant monitoring of  vital signs, hemodynamics, respiratory and cardiac monitoring, review of multiple databases, neurological assessment, discussion with family, other specialists and medical decision making of high complexity. I spent 35 minutes of neurocritical care time in the care of this patient. I had long discussion with boyfriend at bedside, updated pt current condition, treatment plan and potential prognosis, and answered all the questions. He expressed understanding and appreciation.        To contact Stroke Continuity provider, please refer to [email or web address]. After hours, contact General Neurology

## 2023-03-03 ENCOUNTER — Inpatient Hospital Stay (HOSPITAL_COMMUNITY): Payer: Medicaid Other

## 2023-03-03 DIAGNOSIS — I63512 Cerebral infarction due to unspecified occlusion or stenosis of left middle cerebral artery: Secondary | ICD-10-CM | POA: Diagnosis not present

## 2023-03-03 LAB — HEPARIN LEVEL (UNFRACTIONATED): Heparin Unfractionated: 0.39 [IU]/mL (ref 0.30–0.70)

## 2023-03-03 LAB — CBC
HCT: 25 % — ABNORMAL LOW (ref 36.0–46.0)
Hemoglobin: 8.1 g/dL — ABNORMAL LOW (ref 12.0–15.0)
MCH: 24 pg — ABNORMAL LOW (ref 26.0–34.0)
MCHC: 32.4 g/dL (ref 30.0–36.0)
MCV: 74.2 fL — ABNORMAL LOW (ref 80.0–100.0)
Platelets: 77 10*3/uL — ABNORMAL LOW (ref 150–400)
RBC: 3.37 MIL/uL — ABNORMAL LOW (ref 3.87–5.11)
RDW: 24.8 % — ABNORMAL HIGH (ref 11.5–15.5)
WBC: 5.7 10*3/uL (ref 4.0–10.5)
nRBC: 0 % (ref 0.0–0.2)

## 2023-03-03 LAB — BASIC METABOLIC PANEL
Anion gap: 9 (ref 5–15)
BUN: 23 mg/dL — ABNORMAL HIGH (ref 6–20)
CO2: 23 mmol/L (ref 22–32)
Calcium: 8.8 mg/dL — ABNORMAL LOW (ref 8.9–10.3)
Chloride: 106 mmol/L (ref 98–111)
Creatinine, Ser: 0.69 mg/dL (ref 0.44–1.00)
GFR, Estimated: >60 mL/min (ref >60)
Glucose, Bld: 114 mg/dL — ABNORMAL HIGH (ref 70–99)
Potassium: 4.2 mmol/L (ref 3.5–5.1)
Sodium: 138 mmol/L (ref 135–145)

## 2023-03-03 LAB — GLUCOSE, CAPILLARY
Glucose-Capillary: 113 mg/dL — ABNORMAL HIGH (ref 70–99)
Glucose-Capillary: 98 mg/dL (ref 70–99)
Glucose-Capillary: 146 mg/dL — ABNORMAL HIGH (ref 70–99)
Glucose-Capillary: 108 mg/dL — ABNORMAL HIGH (ref 70–99)
Glucose-Capillary: 92 mg/dL (ref 70–99)

## 2023-03-03 MED ORDER — HALOPERIDOL LACTATE 5 MG/ML IJ SOLN
5.0000 mg | Freq: Four times a day (QID) | INTRAMUSCULAR | Status: DC | PRN
  Administered 2023-03-03 – 2023-03-14 (×21): 5 mg via INTRAVENOUS
  Filled 2023-03-03 (×23): qty 1

## 2023-03-03 MED ORDER — QUETIAPINE FUMARATE 50 MG PO TABS
50.0000 mg | ORAL_TABLET | Freq: Two times a day (BID) | ORAL | Status: DC
  Administered 2023-03-03 – 2023-03-13 (×21): 50 mg
  Filled 2023-03-03 (×9): qty 1
  Filled 2023-03-03: qty 2
  Filled 2023-03-03 (×4): qty 1
  Filled 2023-03-03: qty 2
  Filled 2023-03-03 (×7): qty 1

## 2023-03-03 MED ORDER — SIMETHICONE 40 MG/0.6ML PO SUSP
40.0000 mg | Freq: Four times a day (QID) | ORAL | Status: DC | PRN
  Administered 2023-03-03 (×2): 40 mg
  Filled 2023-03-03 (×3): qty 0.6

## 2023-03-03 MED ORDER — HYDROMORPHONE HCL 1 MG/ML IJ SOLN
0.5000 mg | Freq: Once | INTRAMUSCULAR | Status: AC
  Administered 2023-03-03: 0.5 mg via INTRAVENOUS
  Filled 2023-03-03: qty 1

## 2023-03-03 NOTE — Progress Notes (Signed)
Physical Therapy Treatment Patient Details Name: Jacqueline [name] MRN: [id number] DOB: 15-Jun-2000 Today's Date: 03/03/2023   History of Present Illness The pt is a 22 yo female presenting 12/20 with expressive aphasia. 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    PT Comments  Pt progressing towards her physical therapy goals, with improved participation and activity tolerance this session; sedation turned off per RN. Pt received on edge of bed with RN/NT assisting with hygiene task. Pt able to visually track and attend to therapist. Pt following 2-3 commands. Performed stand pivot over to chair with right knee block and two person assist. Able to perform additional stand from the chair. Continues with right hemiparesis, impaired sitting balance, cognition, communication. Suspect steady progress given age and PLOF. Patient will benefit from intensive inpatient follow up therapy, >3 hours/day.   If plan is discharge home, recommend the following: Two people to help with walking and/or transfers;Two people to help with bathing/dressing/bathroom;Assistance with cooking/housework;Assistance with feeding;Direct supervision/assist for medications management;Direct supervision/assist for financial management;Assist for transportation;Help with stairs or ramp for entrance;Supervision due to cognitive status   Can travel by private vehicle        Equipment Recommendations  Other (comment) (TBA)    Recommendations for Other Services Rehab consult     Precautions / Restrictions Precautions Precautions: Fall Precaution Comments: NGT Restrictions Weight Bearing Restrictions Per Provider Order: No     Mobility  Bed Mobility               General bed mobility comments: Sitting EOB with RN upon entry    Transfers Overall transfer  level: Needs assistance Equipment used: 2 person hand held assist Transfers: Sit to/from Stand, Bed to chair/wheelchair/BSC Sit to Stand: Max assist, +2 physical assistance Stand pivot transfers: Max assist, +2 physical assistance         General transfer comment: MaxA + 2 with right knee block to stand from edge of bed and pivot towards left to chair. Pt able to stand an additional time from chair    Ambulation/Gait               General Gait Details: unable   Stairs             Wheelchair Mobility     Tilt Bed    Modified Rankin (Stroke Patients Only) Modified Rankin (Stroke Patients Only) Pre-Morbid Rankin Score: No symptoms Modified Rankin: Severe disability     Balance Overall balance assessment: Needs assistance Sitting-balance support: Feet supported, No upper extremity supported, Single extremity supported Sitting balance-Leahy Scale: Poor Sitting balance - Comments: Pt pushing with LUE; improved sitting balance when L hand placed in lap   Standing balance support: Bilateral upper extremity supported, During functional activity Standing balance-Leahy Scale: Zero                              Cognition Arousal: Alert Behavior During Therapy: Flat affect Overall Cognitive Status: Difficult to assess                                 General Comments: Pt follows command to stand and squeeze therapist hand with L hand, but does not follow command for thumbs up, high five, sticking tongue out. Question cognition  vs effort. Overall very flat affect, however, did smile once at RN        Exercises      General Comments        Pertinent Vitals/Pain Pain Assessment Pain Assessment: Faces Faces Pain Scale: No hurt    Home Living                          Prior Function            PT Goals (current goals can now be found in the care plan section) Acute Rehab PT Goals Patient Stated Goal: return to  independence, to see her kids Potential to Achieve Goals: Good Progress towards PT goals: Progressing toward goals    Frequency    Min 1X/week      PT Plan      Co-evaluation              AM-PAC PT "6 Clicks" Mobility   Outcome Measure  Help needed turning from your back to your side while in a flat bed without using bedrails?: A Lot Help needed moving from lying on your back to sitting on the side of a flat bed without using bedrails?: Total Help needed moving to and from a bed to a chair (including a wheelchair)?: Total Help needed standing up from a chair using your arms (e.g., wheelchair or bedside chair)?: Total Help needed to walk in hospital room?: Total Help needed climbing 3-5 steps with a railing? : Total 6 Click Score: 7    End of Session Equipment Utilized During Treatment: Gait belt Activity Tolerance: Patient tolerated treatment well Patient left: in chair;with call bell/phone within reach;with chair alarm set Nurse Communication: Mobility status PT Visit Diagnosis: Other abnormalities of gait and mobility (R26.89);Hemiplegia and hemiparesis;Other symptoms and signs involving the nervous system (R29.898) Hemiplegia - Right/Left: Right Hemiplegia - dominant/non-dominant: Dominant Hemiplegia - caused by: Cerebral infarction     Time: 1610-9604 PT Time Calculation (min) (ACUTE ONLY): 20 min  Charges:    $Therapeutic Activity: 8-22 mins PT General Charges $$ ACUTE PT VISIT: 1 Visit                     Lillia Pauls, PT, DPT Acute Rehabilitation Services Office [phone]    Norval Morton 03/03/2023, 1:57 PM

## 2023-03-03 NOTE — Progress Notes (Signed)
Speech Language Pathology Treatment: Dysphagia  Patient Details Name: Jacqueline [name] MRN: [id number] DOB: [birth year] Today's Date: 03/03/2023 Time: 0945-1000 SLP Time Calculation (min) (ACUTE ONLY): 15 min  Assessment / Plan / Recommendation Clinical Impression  Pt was yelling upon SLP arrival and stopped initially when offered POs. However, even with tactile input at lips, hand-over-hand assist for feeding, and presentation of various textures/flavors, pt did not show any active initiation to accept boluses into her oral cavity. Pt quickly started to yell again and no swallowing was observed. Attempted to elicit any yes/no responses from her, even with use of nonverbal gestures. None seen by SLP, but pt's significant other thinks that she may have straightened her leg to command x1 when instructed to do so for an affirmative response. Education was provided, encouraging him to look for consistency in responses and to try to use consistent gestures. Requested that he let staff know anything he is seeing so that we can try to incorporate as much multimodal communication as able.   HPI HPI: The pt is a 22 yo female presenting 12/20 with expressive aphasia. 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 MRI showed fairly extensive acute left MCA territory and watershed territory infarcts within the left cerebral hemisphere as well as small acute R frontoparietal cortical/subcortical infarcts. Pt initially passed the Yale swallow screen but become more aphasic and started to cough with PO intake. 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      SLP Plan  Continue with current plan of care      Recommendations for follow up therapy are one component of a multi-disciplinary discharge planning process, led by the attending physician.  Recommendations may be updated based on patient status,  additional functional criteria and insurance authorization.    Recommendations  Diet recommendations: NPO (can offer a few ice chips after oral care if accepting) Medication Administration: Via alternative means                  Oral care QID   Frequent or constant Supervision/Assistance Dysphagia, unspecified (R13.10)     Continue with current plan of care     Mahala Menghini., M.A. CCC-SLP Acute Rehabilitation Services Office [phone]  Secure chat preferred   03/03/2023, 12:08 PM

## 2023-03-03 NOTE — Progress Notes (Signed)
Inpatient Rehab Admissions Coordinator:   At this time we are recommending a CIR consult and I will place an order per our protocol.   Estill Dooms, PT, DPT Admissions Coordinator [phone] 03/03/23  3:28 PM

## 2023-03-03 NOTE — Progress Notes (Signed)
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   Medical History: Past Medical History:  Diagnosis Date   Bacterial vaginitis 07/10/2016   Candida vaginitis 07/24/2016   Headache in pregnancy, antepartum, third trimester 07/24/2016   Kell isoimmunization during pregnancy     Assessment: 22 YOF presented with acute CVA and MRI also shows endoluminal thrombus.  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 Hx of thrombocytopenia. Pharmacy consulted to dose heparin.  Heparin level remains therapeutic at 0.39 on 1200 units/hr, CBC stable, plts uptrend to 77  Goal of Therapy:  Heparin level 0.3-0.5 units/ml Monitor platelets by anticoagulation protocol: Yes   Plan:  Continue heparin gtt at 1200 units/hr Daily heparin level, CBC, s/s bleeding  Daylene Posey, PharmD, [facility] Clinical Pharmacist ED Pharmacist Phone # [phone] 03/03/2023 8:14 AM

## 2023-03-03 NOTE — Progress Notes (Signed)
Nutrition Follow-up  DOCUMENTATION CODES:   Not applicable  INTERVENTION:   Continue TF via Cortrak: Osmolite 1.5 at 55 ml/h Prosource TF 20 60 ml BID  Provides 2240 kcal, 122 gm protein, 1003 ml free water daily.  Continue MVI, Vitamin B12, thiamine, and iron supplementation.  If excessive loose stooling continues, consider changing Miralax to prn.   NUTRITION DIAGNOSIS:   Inadequate oral intake related to inability to eat as evidenced by NPO status.  Ongoing   GOAL:   Patient will meet greater than or equal to 90% of their needs  Met with TF at goal rate.   MONITOR:   TF tolerance  REASON FOR ASSESSMENT:   Consult Enteral/tube feeding initiation and management  ASSESSMENT:   Pt with PMH of hemoglobin C trait, unprovoked PE not currently on anticoagulation admitted with aphasia after heavy alcohol use and recreation use of Suboxone. 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  Discussed patient in ICU rounds and with RN today. Patient is off Precedex and has transfer orders to telemetry. She has been agitated, but was sitting in her chair looking out the window this morning.   Remains NPO. Cortrak was replaced 12/27, tip in the distal stomach vs proximal duodenum. Receiving Osmolite 1.5 at 55 ml/h with Prosource TF20 60 ml BID via Cortrak. Tolerating well to meet 100% of estimated nutrition needs.   Labs reviewed.  CBG: 98-146  Medications reviewed and include vitamin B-12 tablet, folic acid, niferex, MVI liquid, miralax, thiamine.  Admission weight 72.6 kg Current weight 71.2 kg  5 stools documented in the past 24 hours; receiving miralax 17 gm daily via tube.  I/O + 5.6 L since admission  Diet Order:   Diet Order             Diet NPO time specified  Diet effective now                   EDUCATION NEEDS:   No education needs  have been identified at this time  Skin:  Skin Assessment: Reviewed RN Assessment  Last BM:  12/30 type 5  Height:   Ht Readings from Last 1 Encounters:  02/21/23 5\' 4"  (1.626 m)    Weight:   Wt Readings from Last 1 Encounters:  03/03/23 71.2 kg    BMI:  Body mass index is 26.94 kg/m.  Estimated Nutritional Needs:   Kcal:  2000-2200  Protein:  100-115 grams  Fluid:  >2 L/day   Gabriel Rainwater RD, LDN, CNSC Please refer to Amion for contact information.

## 2023-03-03 NOTE — TOC CM/SW Note (Signed)
Transition of Care [facility]) - Inpatient Brief Assessment   Patient Details  Name: Jacqueline [name] MRN: [id number] Date of Birth: February 13, 2001  Transition of Care [facility]) CM/SW Contact:    Mearl Latin, LCSW Phone Number: 03/03/2023, 8:25 AM   Clinical Narrative: TOC continuing to follow.    Transition of Care Asessment: Insurance and Status: Insurance coverage has been reviewed Patient has primary care physician: Yes Home environment has been reviewed: Lives with minor children Prior level of function:: Independent Prior/Current Home Services: No current home services Social Drivers of Health Review: SDOH reviewed no interventions necessary Readmission risk has been reviewed: Yes Transition of care needs: transition of care needs identified, TOC will continue to follow

## 2023-03-03 NOTE — Progress Notes (Signed)
Inpatient Rehab Admissions Coordinator:   CIR continuing to follow through chart review for appropriateness for rehab consult.   Estill Dooms, PT, DPT Admissions Coordinator [phone] 03/03/23  11:07 AM

## 2023-03-03 NOTE — Progress Notes (Signed)
STROKE TEAM PROGRESS NOTE   BRIEF HPI [name] is a 22 y.o. 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  The night before presentation, she drank a significant amount of alcohol and took some Suboxone that was not prescribed to her.  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  She went to IR for mechanical thrombectomy, but this was unfortunately unsuccessful.  This morning, patient is noted to have continued expressive aphasia and right hemiparesis.  Patient had a significant episode of agitation this morning with combativeness requiring use of restraints and initiation of Precedex drip.  Per patient's boyfriend, she does not drink every day and does not typically use illicit drugs.  NIH on Admission 3   SIGNIFICANT HOSPITAL EVENTS 12/20-patient admitted, mechanical thrombectomy attempted but unsuccessful 12/21-significant episode of agitation requiring restraints and Precedex 12/24-off precedex 12/25-continue intermittent fever and agitation, on Abx and resume precedex. Resumed heparin IV 12/26-repeat MRI showed stroke extension.  12/30-still intermittent agitation, yelling, but distractable. On seroquel and clonazepam.   INTERIM HISTORY/SUBJECTIVE boyfriend is at the bedside. Pt was again agitated this am, yelling and restless. The agitation is episodic, seems distractable with conversation and working with PT and OT. Will repeat CT, UA and urine culture, KUB to make sure medically stable.  OBJECTIVE  CBC    Component Value Date/Time   WBC 5.7 03/03/2023 0504   RBC 3.37 (L) 03/03/2023 0504   HGB 8.1 (L) 03/03/2023 0504   HGB 10.7 (L) 05/21/2017 1026   HCT 25.0 (L) 03/03/2023 0504   HCT 32.4 (L) 05/21/2017 1026   PLT 77 (L) 03/03/2023 0504   PLT 118 (L) 05/21/2017 1026   MCV 74.2 (L) 03/03/2023 0504   MCV 82 05/21/2017 1026   MCH 24.0 (L) 03/03/2023 0504    MCHC 32.4 03/03/2023 0504   RDW 24.8 (H) 03/03/2023 0504   RDW 15.3 05/21/2017 1026   LYMPHSABS 1.6 02/26/2023 0516   LYMPHSABS 2.3 02/28/2017 1618   MONOABS 0.9 02/26/2023 0516   EOSABS 0.1 02/26/2023 0516   EOSABS 0.0 02/28/2017 1618   BASOSABS 0.0 02/26/2023 0516   BASOSABS 0.0 02/28/2017 1618    BMET    Component Value Date/Time   NA 138 03/03/2023 0504   K 4.2 03/03/2023 0504   CL 106 03/03/2023 0504   CO2 23 03/03/2023 0504   GLUCOSE 114 (H) 03/03/2023 0504   BUN 23 (H) 03/03/2023 0504   CREATININE 0.69 03/03/2023 0504   CALCIUM 8.8 (L) 03/03/2023 0504   GFRNONAA >60 03/03/2023 0504    IMAGING past 24 hours No results found.    Vitals:   03/03/23 0630 03/03/23 0700 03/03/23 0800 03/03/23 0900  BP: 110/65 99/66 101/65 (!) 86/44  Pulse: 71 72 73 90  Resp: 19 (!) 21 20 (!) 30  Temp:   97.8 F (36.6 C)   TempSrc:   Axillary   SpO2: 98% 97% 98% 100%  Weight:      Height:         PHYSICAL EXAM General: Drowsy, well-nourished, well-developed patient with intermittent agitation, yelling CV: Regular rhythm on monitor but tachycardia Respiratory:  Regular, unlabored respirations on room air  NEURO:  Intermittent agitation with yelling, still lethargic, but slowly open eyes on voice. Nonverbal and not following commands. Eyes left gaze preference, barely cross midline. Blinking to visual threat on the left but not on the right. Right facial droop with right  UE and LE flaccid. RUE and RLE spontaneous movement against gravity. Sensation, coordination and gait not tested.   ASSESSMENT/PLAN  Stroke:  left MCA territory infarct s/p unsuccessful IR with extension, etiology: likely related to hypercoagulability from positive lupus anticoagulant not on [facility] Code Stroke CT head left parietotemporal \\infarct  ASPECTS 7-8 CTA head & neck occluded left proximal to mid M2 MCA CT perfusion 21 mm penumbra in left parietotemporal region S/p IR - initially TICI3, but progressive  worsening stenosis with subsequent complete closure of left M2. Intermittent mild improvement in cailber with verapamil and nitroglycerine.  MRI acute left MCA territory infarct without hemorrhagic conversion CT repeat likely stroke extension in the anterior inferior right frontal lobe and right cerebellum MRI repeat showed stroke extension but no hemorrhagic conversion. MLS mild < 3mm.  CT repeat 12/28 showed stable large right MCA and small left MCA/ACA infarcts. MLS 2-61mm CT repeat 12/30 pending 2D Echo EF 60 to 65%.  Left atrium size normal. EEG severe encephalopathy, no seizure LDL 42 HgbA1c 4.4 UDS neg VTE prophylaxis -SCDs No antithrombotic prior to admission, now on heparin IV.  Therapy recommendations:  CIR Disposition: Pending  Agitation, episodic Patient had intermittent episodes of agitation, but distractable with conversation and working with PT and OT, concerning for frustration Was on precedex -> off -> back on precedex -> off again Per patient's significant other, she does not frequently drink or use illicit substances, but concern for withdrawal still remains given alcohol and Suboxone use prior to admission Depakote level 42 On depakote 500mg  Q8h -> 1000 bid On clonazepam 0.5 bid -> d/c->1mg  bid Seroquel to 50->25->50 bid  Positive lupus anticoagulant  Hx of 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, DRVVT, 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 - put on eliquis  Followed up with oncology/hematology in 07/2022 Per report, she stopped taking eliquis in 08/2022 Current admission repeat anti-cardiolipin IgM still elevated  Per hematology, can start anticoagulation when platelet count is greater than 50  now on heparin IV  Anemia  Thrombocytopenia B12 deficiency  Iron deficiency  Hb 8.4->7.6->7.9->7.4->7.9->8.1->8.5->8.1 B12 = 122, folate WNL Platelet 54->44->48->59->48->57->54->65->77 On iron infusion and B12 supplement CBC  monitoring  Fever UTI Tmax 102.2->102.3->101.1->100.7->100.1->afebrile Tachycardia with low BP, need to monitor for sepsis/septic shock UA WBC 21-50, Urine Cx E. Coli, finished rocephin course UA and U Cx repeat pending Blood Cx neg RPR again positive 1:1, but T. Pallidum Ab negative CCM on board  BP management Home meds: None BP stable now Close monitoring  Lipid management Home meds: None LDL 42, goal < 70 High intensity statin not indicated as LDL below goal  Tobacco Abuse Patient smokes cigars Will discuss tobacco cessation when patient is alert and oriented  Substance Abuse Patient uses Suboxone UDS negative Will discuss substance abuse and cessation when patient is alert and oriented  Dysphagia Patient has post-stroke dysphagia SLP consulted Currently NPO Cortrak-> pulled off -> NG tube -> cortrak On TF @ 55  Other Stroke Risk Factors ETOH use, alcohol level 14, advised patient to drink no more than 1 drink a day  Other Active Problems Leukocytosis WBC 15.9->11.5->8.1->7.2->6.3->6.7->6.1->8.3->5.7  Hospital day # 10   Marvel Plan, MD PhD Stroke Neurology 03/03/2023 10:29 AM   This patient is critically ill due to stroke with failed IR, stroke extension, fever, anemia, thromocytopenia, lupus anticoagulant, hx of PE and agitation and at significant risk of neurological worsening, death form recurrent stroke, brain herniation, sepsis, septic shock, recurrent PE/embolism,  bleeding. This patient's care requires constant monitoring of vital signs, hemodynamics, respiratory and cardiac monitoring, review of multiple databases, neurological assessment, discussion with family, other specialists and medical decision making of high complexity. I spent 40 minutes of neurocritical care time in the care of this patient. I had long discussion with boyfriend at bedside, updated pt current condition, treatment plan and potential prognosis, and answered all the questions. He  expressed understanding and appreciation.       To contact Stroke Continuity provider, please refer to [email or web address]. After hours, contact General Neurology

## 2023-03-03 NOTE — Plan of Care (Signed)
  Problem: Ischemic Stroke/TIA Tissue Perfusion: Goal: Complications of ischemic stroke/TIA will be minimized Outcome: Progressing   Problem: Nutrition: Goal: Dietary intake will improve Outcome: Progressing   Problem: Education: Goal: Knowledge of disease or condition will improve Outcome: Not Progressing Goal: Knowledge of secondary prevention will improve (MUST DOCUMENT ALL) Outcome: Not Progressing Goal: Knowledge of patient specific risk factors will improve [name] N/A or DELETE if not current risk factor) Outcome: Not Progressing   Problem: Coping: Goal: Will verbalize positive feelings about self Outcome: Not Progressing   Problem: Self-Care: Goal: Ability to participate in self-care as condition permits will improve Outcome: Not Progressing

## 2023-03-04 DIAGNOSIS — E538 Deficiency of other specified B group vitamins: Secondary | ICD-10-CM

## 2023-03-04 DIAGNOSIS — D509 Iron deficiency anemia, unspecified: Secondary | ICD-10-CM

## 2023-03-04 DIAGNOSIS — D6862 Lupus anticoagulant syndrome: Secondary | ICD-10-CM

## 2023-03-04 DIAGNOSIS — R1310 Dysphagia, unspecified: Secondary | ICD-10-CM

## 2023-03-04 DIAGNOSIS — N390 Urinary tract infection, site not specified: Secondary | ICD-10-CM

## 2023-03-04 DIAGNOSIS — R451 Restlessness and agitation: Secondary | ICD-10-CM | POA: Diagnosis not present

## 2023-03-04 DIAGNOSIS — D649 Anemia, unspecified: Secondary | ICD-10-CM | POA: Diagnosis not present

## 2023-03-04 DIAGNOSIS — I63512 Cerebral infarction due to unspecified occlusion or stenosis of left middle cerebral artery: Secondary | ICD-10-CM | POA: Diagnosis not present

## 2023-03-04 LAB — BASIC METABOLIC PANEL
Anion gap: 10 (ref 5–15)
BUN: 25 mg/dL — ABNORMAL HIGH (ref 6–20)
CO2: 23 mmol/L (ref 22–32)
Calcium: 9.2 mg/dL (ref 8.9–10.3)
Chloride: 102 mmol/L (ref 98–111)
Creatinine, Ser: 0.76 mg/dL (ref 0.44–1.00)
GFR, Estimated: >60 mL/min (ref >60)
Glucose, Bld: 110 mg/dL — ABNORMAL HIGH (ref 70–99)
Potassium: 4.5 mmol/L (ref 3.5–5.1)
Sodium: 135 mmol/L (ref 135–145)

## 2023-03-04 LAB — GLUCOSE, CAPILLARY
Glucose-Capillary: 100 mg/dL — ABNORMAL HIGH (ref 70–99)
Glucose-Capillary: 107 mg/dL — ABNORMAL HIGH (ref 70–99)
Glucose-Capillary: 110 mg/dL — ABNORMAL HIGH (ref 70–99)
Glucose-Capillary: 121 mg/dL — ABNORMAL HIGH (ref 70–99)
Glucose-Capillary: 88 mg/dL (ref 70–99)
Glucose-Capillary: 94 mg/dL (ref 70–99)
Glucose-Capillary: 96 mg/dL (ref 70–99)

## 2023-03-04 LAB — CBC
HCT: 27.9 % — ABNORMAL LOW (ref 36.0–46.0)
Hemoglobin: 9.1 g/dL — ABNORMAL LOW (ref 12.0–15.0)
MCH: 24.5 pg — ABNORMAL LOW (ref 26.0–34.0)
MCHC: 32.6 g/dL (ref 30.0–36.0)
MCV: 75 fL — ABNORMAL LOW (ref 80.0–100.0)
Platelets: 97 10*3/uL — ABNORMAL LOW (ref 150–400)
RBC: 3.72 MIL/uL — ABNORMAL LOW (ref 3.87–5.11)
RDW: 26.1 % — ABNORMAL HIGH (ref 11.5–15.5)
WBC: 6.8 10*3/uL (ref 4.0–10.5)
nRBC: 0 % (ref 0.0–0.2)

## 2023-03-04 LAB — URINE CULTURE: Culture: NO GROWTH

## 2023-03-04 LAB — HEPARIN LEVEL (UNFRACTIONATED): Heparin Unfractionated: 0.48 [IU]/mL (ref 0.30–0.70)

## 2023-03-04 MED ORDER — LORAZEPAM 2 MG/ML IJ SOLN: 0.5000 mg | Freq: Once | INTRAMUSCULAR | Status: DC

## 2023-03-04 MED ORDER — LORAZEPAM 2 MG/ML IJ SOLN
1.0000 mg | Freq: Once | INTRAMUSCULAR | Status: AC
  Administered 2023-03-04: 1 mg via INTRAVENOUS
  Filled 2023-03-04: qty 1

## 2023-03-04 MED ORDER — VALPROIC ACID 250 MG/5ML PO SOLN
750.0000 mg | Freq: Three times a day (TID) | ORAL | Status: DC
  Administered 2023-03-04 – 2023-03-06 (×7): 750 mg
  Filled 2023-03-04 (×7): qty 15

## 2023-03-04 NOTE — Progress Notes (Addendum)
 Occupational Therapy Treatment Patient Details Name: Jacqueline [name] MRN: [id number] DOB: 2000/12/17 Today's Date: 03/04/2023   History of present illness The pt is a 22 yo female presenting 12/20 with expressive aphasia. 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   OT comments  Pt making slow progress toward goals, remains dependent for ADLs, and requiring total A +2 for bed mobility, pt pushing with LUE once EOB and leaning posteriorly. Pt groaning once EOB, and incr oral secretions, returned to supine and pt stopped groaning. Followed basic commands to lift arm and straighten leg on L side. Noted pt tracking to L but does not track nor turn head to R, no AROM noted in RUE, elevated on pillow at end of session. Pt presenting with impairments listed below, will follow acutely. Patient will benefit from intensive inpatient follow up therapy, >3 hours/day to maximize safety/ind with ADL/functional mobility.       If plan is discharge home, recommend the following:  Two people to help with walking and/or transfers;Two people to help with bathing/dressing/bathroom;Assistance with cooking/housework;Direct supervision/assist for medications management;Assistance with feeding;Direct supervision/assist for financial management;Help with stairs or ramp for entrance;Assist for transportation;Supervision due to cognitive status   Equipment Recommendations  Other (comment) (defer)    Recommendations for Other Services      Precautions / Restrictions Precautions Precautions: Fall Precaution Comments: NGT Restrictions Weight Bearing Restrictions Per Provider Order: No       Mobility Bed Mobility         Supine to sit: Total assist, +2 for physical assistance Sit to supine: +2 for physical assistance, Total assist   General bed  mobility comments: posterior and R lateral lean,  pushing with LUE    Transfers                   General transfer comment: deferred     Balance Overall balance assessment: Needs assistance Sitting-balance support: Feet supported, No upper extremity supported, Single extremity supported Sitting balance-Leahy Scale: Poor   Postural control: Right lateral lean, Posterior lean Standing balance support: Bilateral upper extremity supported, During functional activity Standing balance-Leahy Scale: Zero                             ADL either performed or assessed with clinical judgement   ADL                                         General ADL Comments: Pt requiring Total A for ADLs and bed mobility    Extremity/Trunk Assessment Upper Extremity Assessment RUE Deficits / Details: no AROM noted, tone throughout, grimaces with PROM of RUE RUE Coordination: decreased gross motor;decreased fine motor   Lower Extremity Assessment Lower Extremity Assessment: Defer to PT evaluation        Vision   Vision Assessment?: Vision impaired- to be further tested in functional context Additional Comments: opens eyes, does not track to R side, able to track to L to look at therapist, keeps head turned to L   Perception Perception Perception: Not tested   Praxis Praxis Praxis: Not tested    Cognition Arousal: Lethargic Behavior During Therapy: Flat affect Overall Cognitive Status: Difficult to assess  General Comments: follows command to bring LLE toward EOB, to straighten LLE, and to lift LUE up        Exercises      Shoulder Instructions       General Comments      Pertinent Vitals/ Pain       Pain Assessment Pain Assessment: Faces Pain Score: 4  Pain Location: not able to state Pain Descriptors / Indicators: Moaning, Grimacing Pain Intervention(s): Limited activity within patient's  tolerance, Monitored during session, Repositioned  Home Living                                          Prior Functioning/Environment              Frequency  Min 1X/week        Progress Toward Goals  OT Goals(current goals can now be found in the care plan section)  Progress towards OT goals: Progressing toward goals  Acute Rehab OT Goals Patient Stated Goal: pt unable to state OT Goal Formulation: Patient unable to participate in goal setting Time For Goal Achievement: 03/09/23 Potential to Achieve Goals: Good ADL Goals Pt Will Perform Grooming: with mod assist;bed level Pt Will Perform Upper Body Bathing: with max assist;sitting Pt/caregiver will Perform Home Exercise Program: Both right and left upper extremity;With minimal assist;Increased strength Additional ADL Goal #1: Pt will perform bed mobility with Mod A +2 in preparation for ADLs Additional ADL Goal #2: Pt will maintain sitting balance at EOB with Min A for 5 min in preparation for ADLs  Plan      Co-evaluation    PT/OT/SLP Co-Evaluation/Treatment: Yes Reason for Co-Treatment: To address functional/ADL transfers;For patient/therapist safety;Complexity of the patient's impairments (multi-system involvement);Necessary to address cognition/behavior during functional activity   OT goals addressed during session: ADL's and self-care      AM-PAC OT 6 Clicks Daily Activity     Outcome Measure   Help from another person eating meals?: Total Help from another person taking care of personal grooming?: Total Help from another person toileting, which includes using toliet, bedpan, or urinal?: Total Help from another person bathing (including washing, rinsing, drying)?: Total Help from another person to put on and taking off regular upper body clothing?: Total Help from another person to put on and taking off regular lower body clothing?: Total 6 Click Score: 6    End of Session    OT  Visit Diagnosis: Unsteadiness on feet (R26.81);Other abnormalities of gait and mobility (R26.89);Muscle weakness (generalized) (M62.81);Hemiplegia and hemiparesis;Pain Hemiplegia - Right/Left: Right Hemiplegia - caused by: Cerebral infarction   Activity Tolerance Patient limited by pain (grimacing and moaning with EOB)   Patient Left in bed;with call bell/phone within reach;with bed alarm set   Nurse Communication Mobility status        Time: 8558-8495 OT Time Calculation (min): 23 min  Charges: OT General Charges $OT Visit: 1 Visit OT Treatments $Therapeutic Activity: 8-22 mins  Jacqueline [name], OTD, OTR/L SecureChat Preferred Acute Rehab [phone]   Jacqueline [name] 03/04/2023, 3:59 PM

## 2023-03-04 NOTE — Consult Note (Signed)
 Physical Medicine and Rehabilitation Consult Reason for Consult:CIR/ L MCA stroke Referring Physician: Dr Toribio Hummer    HPI: Jacqueline [name] is a 22 y.o. R handed female hx of PE 04/2022- and Hb C trait and (+) lupus coagulant- was on [facility] until 08/2022- then took a lot of Suboxone that wasn't hers and also drank a large amount and  admitted the next day with expressive aphasia and R hemiparesis. 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 Pt was found to be more agitated and on 12/26- stroke extension was found by MRI- initially did require Precedex  and restraints- still agitated, but better since Depakote  started and upped and Seroquel  added.  Also has Klonopin  1 mg BID and Prn tramadol  and Fioricet - She also was started on Heparin  gtt and plts currently 97k-  she has Cortrek, due to NPO status.   Per her SO, her LBM was today- just before I came in.  He said she hasn't talked, of nodded head yes/no since admission 9-10 days ago.         Review of Systems  Unable to perform ROS: Patient nonverbal   Past Medical History:  Diagnosis Date   Bacterial vaginitis 07/10/2016   Candida vaginitis 07/24/2016   Headache in pregnancy, antepartum, third trimester 07/24/2016   Kell isoimmunization during pregnancy    Past Surgical History:  Procedure Laterality Date   CESAREAN SECTION N/A 09/04/2016   Procedure: CESAREAN SECTION;  Surgeon: [name], DO;  Location: [facility] BIRTHING SUITES;  Service: Obstetrics;  Laterality: N/A;   CESAREAN SECTION Bilateral    IR CT HEAD LTD  02/21/2023   IR PERCUTANEOUS ART THROMBECTOMY/INFUSION INTRACRANIAL INC DIAG ANGIO  02/21/2023   RADIOLOGY WITH ANESTHESIA N/A 02/21/2023   Procedure: IR WITH ANESTHESIA;  Surgeon: Dolphus Carrion, MD;  Location: [facility] OR;  Service: Radiology;  Laterality: N/A;   Family History  Problem Relation Age of Onset   Hypertension Maternal Grandmother     Diabetes Maternal Grandmother    Cancer Neg Hx    Social History:  reports that she has been smoking cigars. She has never used smokeless tobacco. She reports current alcohol use. She reports that she does not use drugs. Allergies: No Known Allergies Medications Prior to Admission  Medication Sig Dispense Refill   acetaminophen  (TYLENOL ) 500 MG tablet Take 500 mg by mouth every 6 (six) hours as needed for moderate pain (pain score 4-6).     ibuprofen  (ADVIL ) 200 MG tablet Take 200-600 mg by mouth every 6 (six) hours as needed for moderate pain (pain score 4-6).     apixaban  (ELIQUIS ) 5 MG TABS tablet Take apixaban  10 mg (2 tabs) twice daily until [facility] Feb 14 then reduce to apixaban  5 mg (1 tab) twice daily (Patient not taking: Reported on 02/22/2023) 66 tablet 0    Home: Home Living Family/patient expects to be discharged to:: Private residence Living Arrangements: Children (6 and 5 yo) Available Help at Discharge: Family, Available 24 hours/day (boyfriend, mom, or sister) Type of Home: Apartment Home Access: Stairs to enter Secretary/administrator of Steps: 1 flight Entrance Stairs-Rails: None (walls) Home Layout: One level Bathroom Shower/Tub: Engineer, Manufacturing Systems: Standard Home Equipment: Grab bars - tub/shower  Functional History: Prior Function Prior Level of Function : Independent/Modified Independent, Driving Mobility Comments: independent ADLs Comments: independent Functional Status:  Mobility: Bed Mobility Overal bed mobility: Needs Assistance Bed Mobility: Supine to Sit,  Sit to Supine Supine to sit: Total assist, +2 for physical assistance Sit to supine: +2 for physical assistance, Total assist General bed mobility comments: posterior and R lateral lean,  pushing with LUE Transfers Overall transfer level: Needs assistance Equipment used: 2 person hand held assist Transfers: Sit to/from Stand, Bed to chair/wheelchair/BSC Sit to Stand: Max assist, +2  physical assistance Bed to/from chair/wheelchair/BSC transfer type:: Stand pivot Stand pivot transfers: Max assist, +2 physical assistance  Lateral/Scoot Transfers: +2 physical assistance, Max assist General transfer comment: deferred Ambulation/Gait General Gait Details: unable    ADL: ADL Overall ADL's : Needs assistance/impaired General ADL Comments: Pt requiring Total A for ADLs and bed mobility  Cognition: Cognition Overall Cognitive Status: Difficult to assess Arousal/Alertness: Awake/alert Orientation Level: Other (comment) (uta) Attention: Sustained Sustained Attention:  (sustained attention to PO trials well initially) Behaviors: Other (comment) (yelling, not agitated with SLP but has been agitated with other staff) Cognition Arousal: Alert Behavior During Therapy: Restless, Agitated Overall Cognitive Status: Difficult to assess General Comments: follows command to bring LLE toward EOB, to straighten LLE, and to lift LUE up; moaning/crying out while sitting EOB and began to strongly push back against therapist, also kicking wtih LLE Difficult to assess due to: Impaired communication  Blood pressure 104/69, pulse 82, temperature 99.5 F (37.5 C), temperature source Axillary, resp. rate 18, height 5' 4 (1.626 m), weight 75.7 kg, SpO2 100%, unknown if currently breastfeeding. Physical Exam Vitals and nursing note reviewed. Exam conducted with a chaperone present.  Constitutional:      Comments: Eyes closes, nonverbal, BF at bedside holding her hand; agitated, no acute distress; cortrak beeping- clogged  HENT:     Head: Normocephalic and atraumatic.     Comments: Cotrak in nare R facial droop- mild noted- pt cannot follow commands to stick tongue out    Right Ear: External ear normal.     Left Ear: External ear normal.     Nose: Nose normal. No congestion.     Mouth/Throat:     Mouth: Mucous membranes are dry.     Pharynx: Oropharynx is clear. No oropharyngeal  exudate.     Comments: Hoarse voice when screaming out loud Eyes:     Comments: Eyes closed throughout  Cardiovascular:     Rate and Rhythm: Regular rhythm. Tachycardia present.     Heart sounds: Normal heart sounds. No murmur heard.    No gallop.     Comments: Rate mid 100's Pulmonary:     Effort: Pulmonary effort is normal. No respiratory distress.     Breath sounds: Normal breath sounds. No wheezing, rhonchi or rales.  Abdominal:     General: Bowel sounds are normal. There is no distension.     Palpations: Abdomen is soft.     Tenderness: There is no abdominal tenderness.  Musculoskeletal:        General: Normal range of motion.     Cervical back: Neck supple. No tenderness.     Comments: No movement of RUE/RLE- to any sensory stimuli-  Good spontaneous movement of LUE/LLE- kicking and flailing  Skin:    General: Skin is warm and dry.  Neurological:     Comments: Was yelling in spite of BF/SO rubbing arm/leg in calming manner; did calm down for short period when rubbed forehead Nonverbal- not nodding head yes/no to commands No clonus Equivocal hoffman's on RUE No increased tone Fell back asleep then woke up crying/agitated multiple times  Psychiatric:     Comments:  agitated     Results for orders placed or performed during the hospital encounter of 02/21/23 (from the past 24 hours)  Glucose, capillary     Status: None   Collection Time: 03/03/23  7:42 PM  Result Value Ref Range   Glucose-Capillary 92 70 - 99 mg/dL  Glucose, capillary     Status: None   Collection Time: 03/04/23  1:12 AM  Result Value Ref Range   Glucose-Capillary 88 70 - 99 mg/dL  Glucose, capillary     Status: None   Collection Time: 03/04/23  4:11 AM  Result Value Ref Range   Glucose-Capillary 96 70 - 99 mg/dL  Heparin  level (unfractionated)     Status: None   Collection Time: 03/04/23  8:07 AM  Result Value Ref Range   Heparin  Unfractionated 0.48 0.30 - 0.70 IU/mL  Basic metabolic panel      Status: Abnormal   Collection Time: 03/04/23  8:07 AM  Result Value Ref Range   Sodium 135 135 - 145 mmol/L   Potassium 4.5 3.5 - 5.1 mmol/L   Chloride 102 98 - 111 mmol/L   CO2 23 22 - 32 mmol/L   Glucose, Bld 110 (H) 70 - 99 mg/dL   BUN 25 (H) 6 - 20 mg/dL   Creatinine, Ser 9.23 0.44 - 1.00 mg/dL   Calcium  9.2 8.9 - 10.3 mg/dL   GFR, Estimated >39 >39 mL/min   Anion gap 10 5 - 15  CBC     Status: Abnormal   Collection Time: 03/04/23  8:07 AM  Result Value Ref Range   WBC 6.8 4.0 - 10.5 K/uL   RBC 3.72 (L) 3.87 - 5.11 MIL/uL   Hemoglobin 9.1 (L) 12.0 - 15.0 g/dL   HCT 72.0 (L) 63.9 - 53.9 %   MCV 75.0 (L) 80.0 - 100.0 fL   MCH 24.5 (L) 26.0 - 34.0 pg   MCHC 32.6 30.0 - 36.0 g/dL   RDW 73.8 (H) 88.4 - 84.4 %   Platelets 97 (L) 150 - 400 K/uL   nRBC 0.0 0.0 - 0.2 %  Glucose, capillary     Status: Abnormal   Collection Time: 03/04/23  8:59 AM  Result Value Ref Range   Glucose-Capillary 121 (H) 70 - 99 mg/dL  Glucose, capillary     Status: None   Collection Time: 03/04/23 11:40 AM  Result Value Ref Range   Glucose-Capillary 94 70 - 99 mg/dL  Glucose, capillary     Status: Abnormal   Collection Time: 03/04/23  4:11 PM  Result Value Ref Range   Glucose-Capillary 100 (H) 70 - 99 mg/dL   CT HEAD WO CONTRAST ( ) Result Date: 03/03/2023 CLINICAL DATA:  Stroke, follow up. EXAM: CT HEAD WITHOUT CONTRAST TECHNIQUE: Contiguous axial images were obtained from the base of the skull through the vertex without intravenous contrast. RADIATION DOSE REDUCTION: This exam was performed according to the departmental dose-optimization program which includes automated exposure control, adjustment of the mA and/or kV according to patient size and/or use of iterative reconstruction technique. COMPARISON:  Head CT 03/01/2023. FINDINGS: Brain: Continued evolution of the infarcts in the posterior left MCA territory and left ACA-MCA border zone. No acute hemorrhage or significant mass effect. Unchanged  subcortical hypoattenuation deep to the right central sulcus. No hydrocephalus or extra-axial collection. No mass effect or midline shift. 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 Skull: No calvarial fracture  or suspicious bone lesion. Skull base is unremarkable. Sinuses/Orbits: No acute finding. Other: None. IMPRESSION: 1. Continued evolution of the infarcts in the posterior left MCA territory and left ACA-MCA border zone. No acute hemorrhage or significant mass effect. 2. 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 Electronically Signed   By: Ryan Chess M.D.   On: 03/03/2023 16:57   DG Abd 1 View Result Date: 03/03/2023 CLINICAL DATA:  Abdominal pain EXAM: ABDOMEN - 1 VIEW COMPARISON:  02/28/2023 FINDINGS: Mild diffuse gaseous distention of the colon. No small bowel dilatation. No suspicious calcifications or mass effect. Unchanged position of Dobbhoff tube with tip located either within the distal stomach or proximal duodenum. IMPRESSION: 1. Mild diffuse gaseous distention of the colon, similar to prior examination. No small bowel dilatation to indicate ileus or obstruction. 2. Dobbhoff tube unchanged in position, terminating in the distal stomach or proximal duodenum. Electronically Signed   By: Aliene Lloyd M.D.   On: 03/03/2023 13:00     Assessment/Plan: Diagnosis: L MCA stroke Does the need for close, 24 hr/day medical supervision in concert with the patient's rehab needs make it unreasonable for this patient to be served in a less intensive setting? Yes Co-Morbidities requiring supervision/potential complications: R hemiplegia; dysphagia, agitation, aphasia- nonverbal; Cortrak;  Due to bladder management, bowel management, safety, skin/wound care, disease management, medication administration,  pain management, and patient education, does the patient require 24 hr/day rehab nursing? Yes Does the patient require coordinated care of a physician, rehab nurse, therapy disciplines of PT, OT and SLP to address physical and functional deficits in the context of the above medical diagnosis(es)? Yes Addressing deficits in the following areas: balance, endurance, locomotion, strength, transferring, bowel/bladder control, bathing, dressing, feeding, grooming, toileting, cognition, speech, language, and swallowing Can the patient actively participate in an intensive therapy program of at least 3 hrs of therapy per day at least 5 days per week? Potentially The potential for patient to make measurable gains while on inpatient rehab is fair Anticipated functional outcomes upon discharge from inpatient rehab are min assist and mod assist  with PT, min assist and mod assist with OT, mod assist and max assist with SLP. Estimated rehab length of stay to reach the above functional goals is: 3-4 weeks- but not ready yet! Anticipated discharge destination:  not sure yet- pt is total A of 2 today to accomplish tasks- is nonverbal and agitated Overall Rehab/Functional Prognosis:  not sure yet  RECOMMENDATIONS: This patient's condition is appropriate for continued rehabilitative care in the following setting: CIR Patient has agreed to participate in recommended program. N/A Note that insurance prior authorization may be required for reimbursement for recommended care.  Comment:  1, Patient on Depakote  1000 mg BID and Seroquel  50 mg BID-although HR is elevated, cannot tolerate a beta blocker with her low BP- running 100s-120s systolic- - agitation is her biggest issue right now- could be generalized agitation, but also could be due to HA's/pain- suggest Topamax  25 mg BID- can also be calming, as well as treat/prevent HA's.  2. After started topamax , within 1-2 days, can try Gabapentin  100 mg TID- can help with  agitation as well, but dose low enough shouldn't be as sedating.  3. If get patient more calm, then can try Amantadine 100 mg daily- for initiation and wake her up better- if still needed? 4. No major signs of spasticity on exam, but will keep an  eye on it, since she's at high risk.  5. Bowels working well.  6. Not appropriate Quite YET for intp rehab, but will be ready, I would think in a few days-1 week at most-   7. Thank you for this consult   I spent a total of 67   minutes on total care today- >50% coordination of care- due to  Review of chart, labs, imaging, vitals and d/w OT as well as nursing- also interview with SO and exam of patient- d/w admissions coordinator and documentation.   Renata Gambino, MD 03/04/2023

## 2023-03-04 NOTE — Progress Notes (Signed)
 PHARMACY - ANTICOAGULATION CONSULT NOTE  Pharmacy Consult:  Heparin  Indication: Lupus AC and endoluminal thrombus   No Known Allergies  Patient Measurements: Height: 5' 4 (162.6 cm) Weight: 75.7 kg (166 lb 14.2 oz) IBW/kg (Calculated) : 54.7 Heparin  Dosing Weight: 72 kg  Vital Signs: Temp: 98.2 F (36.8 C) (12/31 0452) Temp Source: Oral (12/31 0452) BP: 130/68 (12/31 0452) Pulse Rate: 85 (12/31 0452)  Labs: Recent Labs    12/29/Jacqueline 0411 12/30/Jacqueline 0504 12/31/Jacqueline 0807  HGB 8.5* 8.1* 9.1*  HCT 26.8* 25.0* 27.9*  PLT 65* 77* 97*  HEPARINUNFRC 0.36 0.39 0.48  CREATININE 0.82 0.69  --     Estimated Creatinine Clearance: 108 mL/min (by C-G formula based on SCr of 0.69 mg/dL).   Medical History: Past Medical History:  Diagnosis Date   Bacterial vaginitis 07/10/2016   Candida vaginitis 07/24/2016   Headache in pregnancy, antepartum, third trimester 07/24/2016   Kell isoimmunization during pregnancy     Assessment: Jacqueline [name] presented with acute CVA and MRI also shows endoluminal thrombus.  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 Hx of thrombocytopenia. Pharmacy consulted to dose heparin .  Heparin  level remains therapeutic at 0.48 on 1200 units/hr, CBC stable, plts uptrend to 97  Goal of Therapy:  Heparin  level 0.3-0.5 units/ml Monitor platelets by anticoagulation protocol: Yes   Plan:  Continue heparin  gtt at 1200 units/hr Daily heparin  level, CBC, s/s bleeding  Thank you Olam Monte, PharmD 03/04/2023 9:00 AM

## 2023-03-04 NOTE — Progress Notes (Signed)
 Inpatient Rehab Admissions Coordinator:   Following at a distance at this time, patient not medically ready nor able to tolerate intensive rehab at this time.   Rehab Admissons Coordinator [place], [place], [place] [phone]

## 2023-03-04 NOTE — Plan of Care (Signed)
  Problem: Education: Goal: Knowledge of disease or condition will improve Outcome: Progressing Goal: Knowledge of secondary prevention will improve (MUST DOCUMENT ALL) Outcome: Progressing Goal: Knowledge of patient specific risk factors will improve Alonso N/A or DELETE if not current risk factor) Outcome: Progressing   Problem: Ischemic Stroke/TIA Tissue Perfusion: Goal: Complications of ischemic stroke/TIA will be minimized Outcome: Progressing   Problem: Coping: Goal: Will verbalize positive feelings about self Outcome: Progressing Goal: Will identify appropriate support needs Outcome: Progressing   Problem: Health Behavior/Discharge Planning: Goal: Ability to manage health-related needs will improve Outcome: Progressing Goal: Goals will be collaboratively established with patient/family Outcome: Progressing   Problem: Self-Care: Goal: Ability to participate in self-care as condition permits will improve Outcome: Progressing Goal: Verbalization of feelings and concerns over difficulty with self-care will improve Outcome: Progressing Goal: Ability to communicate needs accurately will improve Outcome: Progressing   Problem: Nutrition: Goal: Risk of aspiration will decrease Outcome: Progressing Goal: Dietary intake will improve Outcome: Progressing   Problem: Education: Goal: Knowledge of General Education information will improve Description: Including pain rating scale, medication(s)/side effects and non-pharmacologic comfort measures Outcome: Progressing   Problem: Health Behavior/Discharge Planning: Goal: Ability to manage health-related needs will improve Outcome: Progressing   Problem: Clinical Measurements: Goal: Ability to maintain clinical measurements within normal limits will improve Outcome: Progressing Goal: Will remain free from infection Outcome: Progressing Goal: Diagnostic test results will improve Outcome: Progressing Goal: Respiratory  complications will improve Outcome: Progressing Goal: Cardiovascular complication will be avoided Outcome: Progressing   Problem: Activity: Goal: Risk for activity intolerance will decrease Outcome: Progressing   Problem: Nutrition: Goal: Adequate nutrition will be maintained Outcome: Progressing   Problem: Coping: Goal: Level of anxiety will decrease Outcome: Progressing   Problem: Elimination: Goal: Will not experience complications related to bowel motility Outcome: Progressing Goal: Will not experience complications related to urinary retention Outcome: Progressing   Problem: Pain Management: Goal: General experience of comfort will improve Outcome: Progressing   Problem: Safety: Goal: Ability to remain free from injury will improve Outcome: Progressing   Problem: Skin Integrity: Goal: Risk for impaired skin integrity will decrease Outcome: Progressing   Problem: Education: Goal: Understanding of CV disease, CV risk reduction, and recovery process will improve Outcome: Progressing Goal: Individualized Educational Video(s) Outcome: Progressing   Problem: Activity: Goal: Ability to return to baseline activity level will improve Outcome: Progressing   Problem: Cardiovascular: Goal: Ability to achieve and maintain adequate cardiovascular perfusion will improve Outcome: Progressing   Problem: Health Behavior/Discharge Planning: Goal: Ability to safely manage health-related needs after discharge will improve Outcome: Progressing

## 2023-03-04 NOTE — Progress Notes (Signed)
 STROKE TEAM PROGRESS NOTE   BRIEF HPI [name] is a 22 y.o. 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  The night before presentation, she drank a significant amount of alcohol and took some Suboxone that was not prescribed to her.  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  She went to IR for mechanical thrombectomy, but this was unfortunately unsuccessful.  This morning, patient is noted to have continued expressive aphasia and right hemiparesis.  Patient had a significant episode of agitation this morning with combativeness requiring use of restraints and initiation of Precedex  drip.  Per patient's boyfriend, she does not drink every day and does not typically use illicit drugs.  NIH on Admission 3   SIGNIFICANT HOSPITAL EVENTS 12/20-patient admitted, mechanical thrombectomy attempted but unsuccessful 12/21-significant episode of agitation requiring restraints and Precedex  12/24-off precedex  12/25-continue intermittent fever and agitation, on Abx and resume precedex . Resumed heparin  IV 12/26-repeat MRI showed stroke extension.  12/30-still intermittent agitation, yelling, but distractable. On seroquel  and clonazepam .   INTERIM HISTORY/SUBJECTIVE boyfriend is at the bedside. Pt was again agitated last p.m., yelling and restless.  Repeat CT scan of the head showed continued evolution of left posterior MCA territory and left ACA/MCA border zone infarcts.  No new or acute abnormality.  CMP and CBC are stable.  Heparin  level is adequate. She continues to have tube feeds.  Patient is lethargic today on exam remains aphasic with dense right hemiplegia OBJECTIVE  CBC    Component Value Date/Time   WBC 6.8 03/04/2023 0807   RBC 3.72 (L) 03/04/2023 0807   HGB 9.1 (L) 03/04/2023 0807   HGB 10.7 (L) 05/21/2017 1026   HCT 27.9 (L) 03/04/2023 0807   HCT 32.4 (L) 05/21/2017  1026   PLT 97 (L) 03/04/2023 0807   PLT 118 (L) 05/21/2017 1026   MCV 75.0 (L) 03/04/2023 0807   MCV 82 05/21/2017 1026   MCH 24.5 (L) 03/04/2023 0807   MCHC 32.6 03/04/2023 0807   RDW 26.1 (H) 03/04/2023 0807   RDW 15.3 05/21/2017 1026   LYMPHSABS 1.6 02/26/2023 0516   LYMPHSABS 2.3 02/28/2017 1618   MONOABS 0.9 02/26/2023 0516   EOSABS 0.1 02/26/2023 0516   EOSABS 0.0 02/28/2017 1618   BASOSABS 0.0 02/26/2023 0516   BASOSABS 0.0 02/28/2017 1618    BMET    Component Value Date/Time   NA 135 03/04/2023 0807   K 4.5 03/04/2023 0807   CL 102 03/04/2023 0807   CO2 23 03/04/2023 0807   GLUCOSE 110 (H) 03/04/2023 0807   BUN 25 (H) 03/04/2023 0807   CREATININE 0.76 03/04/2023 0807   CALCIUM  9.2 03/04/2023 0807   GFRNONAA >60 03/04/2023 0807    IMAGING past 24 hours CT HEAD WO CONTRAST ( ) Result Date: 03/03/2023 CLINICAL DATA:  Stroke, follow up. EXAM: CT HEAD WITHOUT CONTRAST TECHNIQUE: Contiguous axial images were obtained from the base of the skull through the vertex without intravenous contrast. RADIATION DOSE REDUCTION: This exam was performed according to the departmental dose-optimization program which includes automated exposure control, adjustment of the mA and/or kV according to patient size and/or use of iterative reconstruction technique. COMPARISON:  Head CT 03/01/2023. FINDINGS: Brain: Continued evolution of the infarcts in the posterior left MCA territory and left ACA-MCA border zone. No acute hemorrhage or significant mass effect. Unchanged subcortical hypoattenuation deep to the right central sulcus. No hydrocephalus or extra-axial collection. No mass  effect or midline shift. 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 Skull: No calvarial fracture or suspicious bone lesion. Skull base is unremarkable. Sinuses/Orbits: No acute finding. Other: None. IMPRESSION: 1.  Continued evolution of the infarcts in the posterior left MCA territory and left ACA-MCA border zone. No acute hemorrhage or significant mass effect. 2. 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 Electronically Signed   By: Ryan Chess M.D.   On: 03/03/2023 16:57      Vitals:   03/04/23 0452 03/04/23 0500 03/04/23 0903 03/04/23 1142  BP: 130/68  122/72 108/69  Pulse: 85  89 80  Resp: 19  18 18   Temp: 98.2 F (36.8 C)  97.9 F (36.6 C) 98.1 F (36.7 C)  TempSrc: Oral  Oral Oral  SpO2: 99%  98% 95%  Weight:  75.7 kg    Height:         PHYSICAL EXAM General: Drowsy, well-nourished, well-developed patient with intermittent agitation, yelling CV: Regular rhythm on monitor but tachycardia Respiratory:  Regular, unlabored respirations on room air  NEURO:  Intermittent agitation with yelling, still lethargic, but slowly open eyes on voice. Nonverbal and not following commands. Eyes left gaze preference, barely cross midline. Blinking to visual threat on the left but not on the right. Right facial droop with right UE and LE flaccid. RUE and RLE spontaneous movement against gravity. Sensation, coordination and gait not tested.   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 Intermittent mild improvement in cailber with verapamil  and nitroglycerine.  MRI acute left MCA territory infarct without hemorrhagic conversion CT repeat likely stroke extension in the anterior inferior right frontal  lobe and right cerebellum MRI repeat showed stroke extension but no hemorrhagic conversion. MLS mild < 3mm.  CT repeat 12/28 showed stable large right MCA and small left MCA/ACA infarcts. MLS 2-3mm CT repeat 12/30 expected evolutionary changes in the left posterior division left MCA and left ACA/MCA border zone infarcts.  2D Echo EF 60 to 65%.  Left atrium size normal. EEG severe encephalopathy, no seizure LDL 42 HgbA1c 4.4 UDS neg VTE prophylaxis -SCDs No antithrombotic prior to admission, now on heparin  IV.  Therapy recommendations:  CIR Disposition: Pending  Agitation, episodic Patient had intermittent episodes of agitation, but distractable with conversation and working with PT and OT, concerning for frustration Was on precedex  -> off -> back on precedex  -> off again Per patient's significant other, she does not frequently drink or use illicit substances, but concern for withdrawal still remains given alcohol and Suboxone use prior to admission Depakote  level 42 On depakote  500mg  Q8h -> 1000 bid-changed to 750 every 8 hourly On clonazepam  0.5 bid -> d/c->1mg  bid Seroquel  to 50->25->50 bid  Positive lupus anticoagulant  Hx of 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, DRVVT, 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 - put on eliquis   Followed up with oncology/hematology in 07/2022 Per report, she stopped taking eliquis  in 08/2022  Current admission repeat anti-cardiolipin IgM still elevated  Per hematology, can start anticoagulation when platelet count is greater than 50  now on heparin  IV  Anemia  Thrombocytopenia B12 deficiency  Iron  deficiency  Hb 8.4->7.6->7.9->7.4->7.9->8.1->8.5->8.1 B12 = 122, folate WNL Platelet 54->44->48->59->48->57->54->65->77 On iron  infusion and B12 supplement CBC monitoring  Fever UTI Tmax 102.2->102.3->101.1->100.7->100.1->afebrile Tachycardia with low BP, need to monitor for sepsis/septic shock UA WBC  21-50, Urine Cx E. Coli, finished rocephin  course UA and U Cx repeat no growth  blood Cx neg RPR again positive 1:1, but T. Pallidum Ab negative CCM on board  BP management Home meds: None BP stable now Close monitoring  Lipid management Home meds: None LDL 42, goal < 70 High intensity statin not indicated as LDL below goal  Tobacco Abuse Patient smokes cigars Will discuss tobacco cessation when patient is alert and oriented  Substance Abuse Patient uses Suboxone UDS negative Will discuss substance abuse and cessation when patient is alert and oriented  Dysphagia Patient has post-stroke dysphagia SLP consulted Currently NPO Cortrak-> pulled off -> NG tube -> cortrak On TF @ 55  Other Stroke Risk Factors ETOH use, alcohol level 14, advised patient to drink no more than 1 drink a day  Other Active Problems Leukocytosis WBC 15.9->11.5->8.1->7.2->6.3->6.7->6.1->8.3->5.7  Hospital day # 11  Patient remains aphasic with right hemiplegia as well as intermittently agitated despite being on Klonopin , Seroquel  and Depakene .  Recommend change dose of Depakene  to 750 mg every 8 hourly.  Speech therapy for swallow eval.  Mobilize out of bed.  Long discussion with patient and boyfriend at the bedside and answered questions.  Greater than 50% time during this 35-minute visit was spent on counseling and coordination of care and discussion patient boyfriend and care team and answering questions.   Eather Popp, MD   To contact Stroke Continuity provider, please refer to [email or web address]. After hours, contact General Neurology

## 2023-03-04 NOTE — Progress Notes (Signed)
 PROGRESS NOTE    Jacqueline [name]  FMW:[id number] DOB: 2000/06/21 DOA: 02/21/2023 PCP: Tonnie Raisin, MD    Chief Complaint  Patient presents with   Code Stroke    Brief Narrative:  [name] is a 22 y.o. 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  The night before presentation, she drank a significant amount of alcohol and took some Suboxone that was not prescribed to her.  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  She went to IR for mechanical thrombectomy, but this was unfortunately unsuccessful.  This morning, patient is noted to have continued expressive aphasia and right hemiparesis.  Patient had a significant episode of agitation early on in the hospitalization, with combativeness requiring use of restraints and initiation of Precedex  drip.  Per patient's boyfriend, she does not drink every day and does not typically use illicit drugs.  Patient subsequently weaned off Precedex  drip and transferred out to ICU.   NIH on Admission 3     SIGNIFICANT HOSPITAL EVENTS 12/20-patient admitted, mechanical thrombectomy attempted but unsuccessful 12/21-significant episode of agitation requiring restraints and Precedex  12/24-off precedex  12/25-continue intermittent fever and agitation, on Abx and resume precedex . Resumed heparin  IV 12/26-repeat MRI showed stroke extension.  12/30-still intermittent agitation, yelling, but distractable. On seroquel  and clonazepam .    Assessment & Plan:   Principal Problem:   Acute ischemic left MCA stroke (HCC) Active Problems:   Anemia   Middle cerebral artery embolism, left   Agitation   Urinary tract infection without hematuria   Lupus anticoagulant disorder (HCC)   Dysphagia   Vitamin B12 deficiency  #1 stroke: Left MCA territory infarct status post unsuccessful IR with extension, etiology: Likely related to  hypercoagulability from positive lupus anticoagulant not on anticoagulation -Patient noted on presentation with concerns for code stroke head CT done with left parietotemporal infarct noted. -Patient underwent CT angiogram head and neck that showed an occluded left proximal to mid M2 MCA. -CT perfusion 21 mm penumbra in the left parietotemporal region. -Patient seen in consultation by neurology and interventional neurology patient status post IR initially T1C13, but progressive worsening stenosis with subsequent complete closure of left M2.  Intermittent mild improvement in caliber with verapamil  and nitroglycerin . -MRI done with an acute left MCA territory infarct without hemorrhagic conversion. -Repeat head CT done showed extension of stroke in the anterior inferior right frontal lobe and right cerebellum. -Patient underwent repeat MRI which showed stroke extension but no hemorrhagic conversion. -Repeat CT head 12/28 showed stable large right MCA and small left MCA/ACA infarcts. -Repeat CT 12/30 showed expected evolutionary changes in the left posterior division left MCA and left ACA/MCA border zone infarcts. -2D echo with normal EF. -EEG done with encephalopathy but no epileptiform activity noted. -Fasting lipid panel with LDL of 42.  Statin currently not indicated.  - Hemoglobin A1c noted at 4.4. -Patient noted not to be on antithrombotic medications prior to admission and started on IV heparin  per neurology. -Patient currently with cortrack and receiving tube feeds, PT OT following and recommending CIR.  2.  Agitation -Patient noted to have some intermittent episodes of agitation however per neurology noted to be distractible with conversation and working with PT and OT. -Patient noted to have required Precedex  drip which was subsequently weaned off initially placed back on Precedex  and currently off Precedex . -Per significant other patient does not frequently drink or use illicit substances  but  still concern for withdrawal given alcohol and Suboxone use prior to admission. -Depakote  level noted at 42. -Was on Depakote  500 every 8 hours subsequently changed to 1000 mg twice daily and changed to 750 every 8 hours. -Noted to be on clonazepam  0.5 mg twice daily and increased to 1 mg twice daily. -Continue Seroquel  50 mg twice daily. -Will give a dose of Ativan  1 mg IV x 1. -Follow.  3.  Positive lupus anticoagulant/history of PE -Patient noted to have unprovoked PE 04/2022 noted to have elevated PTT, DRV VT, hexagonal phase phospholipid and positive anticardiolipin IgM and was on Eliquis . -Noted to have followed up with hematology/oncology 07/2022. -Noted that patient stopped taking Eliquis  08/2022. -Anticardiolipin IgM still elevated during this hospitalization. -Per neurology, hematology recommending okay to start anticoagulation once platelet count is greater than 50. -Platelet count improving daily and patient started on IV heparin .  4.  Fever/UTI -Fever curve noted to have trended down. -Urinalysis done concerning for UTI. -Initial urine cultures with 50,000 colonies of E. coli. -Status post full course of IV Rocephin . -Repeat urine cultures with no growth to date. -Blood cultures negative to date. 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 -Outpatient follow-up when medically stable for positive RPR.  5.  BP -BP noted to be soft but improving. -Not on antihypertensive medications prior to admission.  6.  Anemia/thrombocytopenia/B12 deficiency/iron  deficiency -Platelet count improving daily. -Anemia panel concerning for iron  deficiency anemia.  Folate at 9.9.  Vitamin B12 of 122. -Status post IV iron . -Continue oral iron  supplementation and vitamin B12 supplementation. -Outpatient follow-up.  7.  Tobacco abuse -Patient noted to smoke cigars. -When more alert will need discussion about total tobacco cessation.  8.  Substance abuse -It is noted that patient uses  Suboxone. -UDS negative. -Once more alert will need to discuss substance abuse and sensation with patient.  9.  Dysphagia -Core track initially placed pulled out, NG tube placed, core track placed back in currently on tube feeds at 55. -SLP following.     DVT prophylaxis: Heparin  Code Status: Full Family Communication: Updated boyfriend at bedside. Disposition: TBD  Status is: Inpatient Remains inpatient appropriate because: Severity of illness   Consultants:  PCCM: Dr. Arlinda 02/22/2023 CIR  Procedures:  CT head 02/23/2023, 02/26/2023, 03/01/2023, 03/03/2023 Abdominal films 03/03/2023, 02/28/2023, 02/26/2023, 02/24/2023 Chest x-ray 02/23/2023 MRI brain 02/22/2023, 02/27/2023 Lower extremity Dopplers 02/26/2023 CT angiogram head and neck 02/21/2023 CT head code stroke 02/21/2023 2D echo 02/24/2023 Emergent large vessel occlusion thrombolysis per Dr. Dolphus 02/21/2023 Cortrak placement 02/24/2023 EEG 02/27/2023  Antimicrobials: Anti-infectives (From admission, onward)    Start     Dose/Rate Route Frequency Ordered Stop   02/26/23 1100  cefTRIAXone  (ROCEPHIN ) 2 g in sodium chloride  0.9 % 100 mL IVPB        2 g 200 mL/hr over 30 Minutes Intravenous Every 24 hours 02/26/23 1002 03/01/23 1043   02/24/23 1730  cefTRIAXone  (ROCEPHIN ) 2 g in sodium chloride  0.9 % 100 mL IVPB  Status:  Discontinued        2 g 200 mL/hr over 30 Minutes Intravenous Every 24 hours 02/24/23 1633 02/25/23 0831         Subjective: Sleeping deeply.  Boyfriend at bedside states patient was up all night crying and currently asleep. Cortrak in place.  Per RN when patient wakes up patient started screaming and gets agitated.  Objective: Vitals:   03/04/23 0500 03/04/23 0903 03/04/23 1142 03/04/23 1616  BP:  122/72 108/69 104/69  Pulse:  89 80 82  Resp:  18 18 18   Temp:  97.9 F (36.6 C) 98.1 F (36.7 C) 99.5 F (37.5 C)  TempSrc:  Oral Oral Axillary  SpO2:  98% 95% 100%  Weight:  75.7 kg     Height:        Intake/Output Summary (Last 24 hours) at 03/04/2023 1843 Last data filed at 03/04/2023 1100 Gross per 24 hour  Intake 267.95 ml  Output 400 ml  Net -132.05 ml   Filed Weights   03/02/23 0400 03/03/23 0500 03/04/23 0500  Weight: 70.6 kg 71.2 kg 75.7 kg    Examination:  General exam: Sleeping.  Cortrak in nares. Respiratory system: Clear to auscultation anterior lung fields.  No wheezes, no crackles, no rhonchi.  Fair air movement.SABRA Respiratory effort normal. Cardiovascular system: S1 & S2 heard, RRR. No JVD, murmurs, rubs, gallops or clicks. No pedal edema. Gastrointestinal system: Abdomen is nondistended, soft and nontender. No organomegaly or masses felt. Normal bowel sounds heard. Central nervous system: Asleep. Extremities: Symmetric 5 x 5 power. Skin: No rashes, lesions or ulcers Psychiatry: Judgement and insight appear poor l. Mood & affect appropriate.     Data Reviewed: I have personally reviewed following labs and imaging studies  CBC: Recent Labs  Lab 02/26/23 0516 02/27/23 0509 02/28/23 0634 03/01/23 0449 03/02/23 0411 03/03/23 0504 03/04/23 0807  WBC 7.2   < > 6.7 6.1 8.3 5.7 6.8  NEUTROABS 4.6  --   --   --   --   --   --   HGB 8.0*   < > 7.9* 8.1* 8.5* 8.1* 9.1*  HCT 25.1*   < > 24.7* 25.7* 26.8* 25.0* 27.9*  MCV 71.5*   < > 73.3* 73.9* 74.2* 74.2* 75.0*  PLT 59*   < > 57* 54* 65* 77* 97*   < > = values in this interval not displayed.    Basic Metabolic Panel: Recent Labs  Lab 02/26/23 0516 02/26/23 1957 02/27/23 0509 03/02/23 0411 03/03/23 0504 03/04/23 0807  NA 141  --  141 136 138 135  K 3.7  --  3.6 4.2 4.2 4.5  CL 110  --  109 105 106 102  CO2 21*  --  22 22 23 23   GLUCOSE 120*  --  119* 117* 114* 110*  BUN 17  --  23* 24* 23* 25*  CREATININE 0.71  --  0.77 0.82 0.69 0.76  CALCIUM  8.8*  --  8.5* 8.7* 8.8* 9.2  MG 2.1 2.2  --   --   --   --   PHOS 4.9* 4.4  --   --   --   --     GFR: Estimated  Creatinine Clearance: 108 mL/min (by C-G formula based on SCr of 0.76 mg/dL).  Liver Function Tests: No results for input(s): AST, ALT, ALKPHOS, BILITOT, PROT, ALBUMIN in the last 168 hours.  CBG: Recent Labs  Lab 03/04/23 0112 03/04/23 0411 03/04/23 0859 03/04/23 1140 03/04/23 1611  GLUCAP 88 96 121* 94 100*     Recent Results (from the past 240 hours)  Culture, blood (Routine X 2) w Reflex to ID Panel     Status: None   Collection Time: 02/24/23 10:44 PM   Specimen: BLOOD RIGHT HAND  Result Value Ref Range Status   Specimen Description BLOOD RIGHT HAND  Final   Special Requests   Final    BOTTLES DRAWN AEROBIC AND ANAEROBIC Blood Culture adequate volume   Culture  Final    NO GROWTH 5 DAYS Performed at [facility], [address], [name], [place] [address]    Report Status 03/01/2023 FINAL  Final  Culture, blood (Routine X 2) w Reflex to ID Panel     Status: None   Collection Time: 02/24/23 10:44 PM   Specimen: BLOOD RIGHT HAND  Result Value Ref Range Status   Specimen Description BLOOD RIGHT HAND  Final   Special Requests   Final    BOTTLES DRAWN AEROBIC AND ANAEROBIC Blood Culture results may not be optimal due to an inadequate volume of blood received in culture bottles   Culture   Final    NO GROWTH 5 DAYS Performed at [facility], [address], [place], [place] [address]    Report Status 03/01/2023 FINAL  Final  Urine Culture (for pregnant, neutropenic or urologic patients or patients with an indwelling urinary catheter)     Status: Abnormal   Collection Time: 02/25/23  1:38 AM   Specimen: Urine, Clean Catch  Result Value Ref Range Status   Specimen Description URINE, CLEAN CATCH  Final   Special Requests   Final    NONE Performed at [facility], [address] [place], [place] [address]    Culture 50,000 COLONIES/mL ESCHERICHIA COLI (A)  Final   Report Status 02/27/2023 FINAL  Final   Organism ID, Bacteria ESCHERICHIA  COLI (A)  Final      Susceptibility   Escherichia coli - MIC*    AMPICILLIN >=32 RESISTANT Resistant     CEFAZOLIN  8 SENSITIVE Sensitive     CEFEPIME  <=0.12 SENSITIVE Sensitive     CEFTRIAXONE  0.5 SENSITIVE Sensitive     CIPROFLOXACIN <=0.25 SENSITIVE Sensitive     GENTAMICIN <=1 SENSITIVE Sensitive     IMIPENEM <=0.25 SENSITIVE Sensitive     NITROFURANTOIN <=16 SENSITIVE Sensitive     TRIMETH/SULFA <=20 SENSITIVE Sensitive     AMPICILLIN/SULBACTAM >=32 RESISTANT Resistant     PIP/TAZO 8 SENSITIVE Sensitive ug/mL    * 50,000 COLONIES/mL ESCHERICHIA COLI  Urine Culture (for pregnant, neutropenic or urologic patients or patients with an indwelling urinary catheter)     Status: None   Collection Time: 03/03/23 10:28 AM   Specimen: Urine, Catheterized  Result Value Ref Range Status   Specimen Description URINE, CATHETERIZED  Final   Special Requests NONE  Final   Culture   Final    NO GROWTH Performed at [facility], [address] [place], [place] [address]    Report Status 03/04/2023 FINAL  Final         Radiology Studies: CT HEAD WO CONTRAST ( ) Result Date: 03/03/2023 CLINICAL DATA:  Stroke, follow up. EXAM: CT HEAD WITHOUT CONTRAST TECHNIQUE: Contiguous axial images were obtained from the base of the skull through the vertex without intravenous contrast. RADIATION DOSE REDUCTION: This exam was performed according to the departmental dose-optimization program which includes automated exposure control, adjustment of the mA and/or kV according to patient size and/or use of iterative reconstruction technique. COMPARISON:  Head CT 03/01/2023. FINDINGS: Brain: Continued evolution of the infarcts in the posterior left MCA territory and left ACA-MCA border zone. No acute hemorrhage or significant mass effect. Unchanged subcortical hypoattenuation deep to the right central sulcus. No hydrocephalus or extra-axial collection. No mass effect or midline shift. Vascular: Persistent  hyperdense vessel in the left sylvian fissure, consistent with known reocclusion of the inferior M2 division of the left MCA. Skull: No  calvarial fracture or suspicious bone lesion. Skull base is unremarkable. Sinuses/Orbits: No acute finding. Other: None. IMPRESSION: 1. Continued evolution of the infarcts in the posterior left MCA territory and left ACA-MCA border zone. No acute hemorrhage or significant mass effect. 2. 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 Electronically Signed   By: Ryan Chess M.D.   On: 03/03/2023 16:57   DG Abd 1 View Result Date: 03/03/2023 CLINICAL DATA:  Abdominal pain EXAM: ABDOMEN - 1 VIEW COMPARISON:  02/28/2023 FINDINGS: Mild diffuse gaseous distention of the colon. No small bowel dilatation. No suspicious calcifications or mass effect. Unchanged position of Dobbhoff tube with tip located either within the distal stomach or proximal duodenum. IMPRESSION: 1. Mild diffuse gaseous distention of the colon, similar to prior examination. No small bowel dilatation to indicate ileus or obstruction. 2. Dobbhoff tube unchanged in position, terminating in the distal stomach or proximal duodenum. Electronically Signed   By: Aliene Lloyd M.D.   On: 03/03/2023 13:00        Scheduled Meds:  Chlorhexidine  Gluconate Cloth  6 each Topical Daily   clonazePAM   1 mg Per Tube BID   vitamin B-12  2,000 mcg Per Tube Daily   feeding supplement (PROSource TF20)  60 mL Per Tube BID   folic acid   1 mg Per Tube Daily   iron  polysaccharides  150 mg Per Tube Daily   LORazepam   1 mg Intravenous Once   multivitamin  15 mL Per Tube Daily   mouth rinse  15 mL Mouth Rinse 4 times per day   polyethylene glycol  17 g Per Tube Daily   QUEtiapine   50 mg Per Tube BID   thiamine   100 mg Per Tube Daily   valproic  acid  750 mg Per Tube TID   Continuous Infusions:  feeding supplement (OSMOLITE 1.5 CAL)  55 mL/hr at 03/03/23 2200   heparin  1,200 Units/hr (03/03/23 2200)     LOS: 11 days    Time spent: 35 minutes    Toribio Hummer, MD [facility]   To contact the attending provider between 7A-7P or the covering provider during after hours 7P-7A, please log into the web site [email or web address] and access using universal [place] password for that web site. If you do not have the password, please call the hospital operator.  03/04/2023, 6:43 PM

## 2023-03-04 NOTE — Progress Notes (Signed)
 Speech Language Pathology Treatment: Dysphagia;Cognitive-Linquistic  Patient Details Name: Jacqueline [name] MRN: [id number] DOB: December 12, 2000 Today's Date: 03/04/2023 Time: 8645-8593 SLP Time Calculation (min) (ACUTE ONLY): 12 min  Assessment / Plan / Recommendation Clinical Impression  Pt was a little calmer today, starting to yell out once when being repositioned and then again when attempting more direct language therapy. She consumed a few ice chips, exhibiting prolonged mastication with difficulty ceasing task even after bolus was cleared. There was frequent coughing associated with ice today. Attempted to offer thicker consistencies, including thicker liquids and purees, but pt pursed her lips and looked away from SLP when offered anything other than ice. SLP tried to elicit yes/no responses via vocalizations, gestures, and visual options but this is when pt seemed to start getting more upset. Upon stepping out of pt's visual field, pt became quiet again. Will continue efforts.    HPI HPI: The pt is a 22 yo female presenting 12/20 with expressive aphasia. 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 MRI showed fairly extensive acute left MCA territory and watershed territory infarcts within the left cerebral hemisphere as well as small acute R frontoparietal cortical/subcortical infarcts. Pt initially passed the Yale swallow screen but become more aphasic and started to cough with PO intake. 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      SLP Plan  Continue with current plan of care      Recommendations for follow up therapy are one component of a multi-disciplinary discharge planning process, led by the attending physician.  Recommendations may be updated based on patient status, additional functional criteria and insurance authorization.    Recommendations  Diet  recommendations: NPO;Other(comment) (can offer a few ice chips after oral care if accepting) Medication Administration: Via alternative means                  Oral care QID   Frequent or constant Supervision/Assistance Dysphagia, unspecified (R13.10)     Continue with current plan of care     Leita SAILOR., M.A. CCC-SLP Acute Rehabilitation Services Office [phone]  Secure chat preferred   03/04/2023, 2:39 PM

## 2023-03-04 NOTE — Progress Notes (Signed)
 Physical Therapy Treatment Patient Details Name: Jacqueline [name] MRN: [id number] DOB: June 03, 2000 Today's Date: 03/04/2023   History of Present Illness The pt is a 22 yo female presenting 12/20 with expressive aphasia. 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    PT Comments  Patient initially calm, however once assisted to sit at EOB (+2 total assist), she became agitated with kicking LLE, strongly pushing posteriorly, and crying/moaning. Returned to supine with pt briefly calmed down, however as therapists left the room, she began crying/moaning again. She was able to follow 3 simple commands during session (with L extremities).     If plan is discharge home, recommend the following: Two people to help with walking and/or transfers;Two people to help with bathing/dressing/bathroom;Assistance with cooking/housework;Assistance with feeding;Direct supervision/assist for medications management;Direct supervision/assist for financial management;Assist for transportation;Help with stairs or ramp for entrance;Supervision due to cognitive status   Can travel by private vehicle        Equipment Recommendations  Other (comment) (TBA)    Recommendations for Other Services       Precautions / Restrictions Precautions Precautions: Fall Precaution Comments: NGT Restrictions Weight Bearing Restrictions Per Provider Order: No     Mobility  Bed Mobility Overal bed mobility: Needs Assistance Bed Mobility: Supine to Sit, Sit to Supine     Supine to sit: Total assist, +2 for physical assistance Sit to supine: +2 for physical assistance, Total assist   General bed mobility comments: posterior and R lateral lean,  pushing with LUE    Transfers                   General transfer comment: deferred    Ambulation/Gait                General Gait Details: unable   Stairs             Wheelchair Mobility     Tilt Bed    Modified Rankin (Stroke Patients Only) Modified Rankin (Stroke Patients Only) Pre-Morbid Rankin Score: No symptoms Modified Rankin: Severe disability     Balance Overall balance assessment: Needs assistance Sitting-balance support: Feet supported, No upper extremity supported, Single extremity supported Sitting balance-Leahy Scale: Zero Sitting balance - Comments: Pt pushing with LUE; improved sitting balance when L hand placed in lap Postural control: Right lateral lean, Posterior lean                                  Cognition Arousal: Alert Behavior During Therapy: Restless, Agitated Overall Cognitive Status: Difficult to assess                                 General Comments: follows command to bring LLE toward EOB, to straighten LLE, and to lift LUE up; moaning/crying out while sitting EOB and began to strongly push back against therapist, also kicking wtih LLE        Exercises Other Exercises Other Exercises: assisting to moblize R UE and LE throughout time at EOB and pt with some active movement in fingers on R and noted pulling R hand away after return to supine, noted no active movement on R LE    General Comments        Pertinent  Vitals/Pain Pain Assessment Pain Assessment: Faces Faces Pain Scale: Hurts little more Pain Location: not able to state Pain Descriptors / Indicators: Moaning, Grimacing Pain Intervention(s): Limited activity within patient's tolerance, Monitored during session, Repositioned    Home Living                          Prior Function            PT Goals (current goals can now be found in the care plan section) Acute Rehab PT Goals Patient Stated Goal: return to independence, to see her kids Time For Goal Achievement: 03/09/23 Potential to Achieve Goals: Good Progress towards PT goals:  Not progressing toward goals - comment (this session due to ?agitation vs pain)    Frequency    Min 1X/week      PT Plan      Co-evaluation PT/OT/SLP Co-Evaluation/Treatment: Yes Reason for Co-Treatment: To address functional/ADL transfers;For patient/therapist safety;Complexity of the patient's impairments (multi-system involvement);Necessary to address cognition/behavior during functional activity PT goals addressed during session: Mobility/safety with mobility;Balance OT goals addressed during session: ADL's and self-care      AM-PAC PT 6 Clicks Mobility   Outcome Measure  Help needed turning from your back to your side while in a flat bed without using bedrails?: Total Help needed moving from lying on your back to sitting on the side of a flat bed without using bedrails?: Total Help needed moving to and from a bed to a chair (including a wheelchair)?: Total Help needed standing up from a chair using your arms (e.g., wheelchair or bedside chair)?: Total Help needed to walk in hospital room?: Total Help needed climbing 3-5 steps with a railing? : Total 6 Click Score: 6    End of Session   Activity Tolerance: Other (comment) (?limited by pain vs agitatoin) Patient left: with call bell/phone within reach;in bed;with bed alarm set Nurse Communication: Mobility status PT Visit Diagnosis: Other abnormalities of gait and mobility (R26.89);Hemiplegia and hemiparesis;Other symptoms and signs involving the nervous system (R29.898) Hemiplegia - Right/Left: Right Hemiplegia - dominant/non-dominant: Dominant Hemiplegia - caused by: Cerebral infarction     Time: 8558-8495 PT Time Calculation (min) (ACUTE ONLY): 23 min  Charges:    $Neuromuscular Re-education: 8-22 mins PT General Charges $$ ACUTE PT VISIT: 1 Visit                      Macario RAMAN, PT Acute Rehabilitation Services  Office [phone]    Macario SHAUNNA Soja 03/04/2023, 4:28 PM

## 2023-03-05 DIAGNOSIS — I63512 Cerebral infarction due to unspecified occlusion or stenosis of left middle cerebral artery: Secondary | ICD-10-CM | POA: Diagnosis not present

## 2023-03-05 DIAGNOSIS — N390 Urinary tract infection, site not specified: Secondary | ICD-10-CM | POA: Diagnosis not present

## 2023-03-05 DIAGNOSIS — D649 Anemia, unspecified: Secondary | ICD-10-CM | POA: Diagnosis not present

## 2023-03-05 DIAGNOSIS — R451 Restlessness and agitation: Secondary | ICD-10-CM | POA: Diagnosis not present

## 2023-03-05 LAB — GLUCOSE, CAPILLARY
Glucose-Capillary: 108 mg/dL — ABNORMAL HIGH (ref 70–99)
Glucose-Capillary: 108 mg/dL — ABNORMAL HIGH (ref 70–99)
Glucose-Capillary: 122 mg/dL — ABNORMAL HIGH (ref 70–99)
Glucose-Capillary: 135 mg/dL — ABNORMAL HIGH (ref 70–99)

## 2023-03-05 LAB — BASIC METABOLIC PANEL
Anion gap: 11 (ref 5–15)
BUN: 26 mg/dL — ABNORMAL HIGH (ref 6–20)
CO2: 22 mmol/L (ref 22–32)
Calcium: 9.1 mg/dL (ref 8.9–10.3)
Chloride: 103 mmol/L (ref 98–111)
Creatinine, Ser: 0.76 mg/dL (ref 0.44–1.00)
GFR, Estimated: >60 mL/min (ref >60)
Glucose, Bld: 95 mg/dL (ref 70–99)
Potassium: 4.1 mmol/L (ref 3.5–5.1)
Sodium: 136 mmol/L (ref 135–145)

## 2023-03-05 LAB — URINALYSIS, ROUTINE W REFLEX MICROSCOPIC
Bacteria, UA: NONE SEEN
Bilirubin Urine: NEGATIVE
Glucose, UA: NEGATIVE mg/dL
Hgb urine dipstick: NEGATIVE
Ketones, ur: NEGATIVE mg/dL
Leukocytes,Ua: NEGATIVE
Nitrite: NEGATIVE
Protein, ur: 30 mg/dL — AB
Specific Gravity, Urine: 1.024 (ref 1.005–1.030)
pH: 7 (ref 5.0–8.0)

## 2023-03-05 LAB — CBC
HCT: 28.5 % — ABNORMAL LOW (ref 36.0–46.0)
Hemoglobin: 9.2 g/dL — ABNORMAL LOW (ref 12.0–15.0)
MCH: 24.4 pg — ABNORMAL LOW (ref 26.0–34.0)
MCHC: 32.3 g/dL (ref 30.0–36.0)
MCV: 75.6 fL — ABNORMAL LOW (ref 80.0–100.0)
Platelets: 93 10*3/uL — ABNORMAL LOW (ref 150–400)
RBC: 3.77 MIL/uL — ABNORMAL LOW (ref 3.87–5.11)
RDW: 26.6 % — ABNORMAL HIGH (ref 11.5–15.5)
WBC: 5.6 10*3/uL (ref 4.0–10.5)
nRBC: 0.4 % — ABNORMAL HIGH (ref 0.0–0.2)

## 2023-03-05 LAB — HEPARIN LEVEL (UNFRACTIONATED): Heparin Unfractionated: 0.4 [IU]/mL (ref 0.30–0.70)

## 2023-03-05 MED ORDER — LORAZEPAM 2 MG/ML IJ SOLN
1.0000 mg | Freq: Three times a day (TID) | INTRAMUSCULAR | Status: DC | PRN
  Administered 2023-03-05 – 2023-03-13 (×9): 1 mg via INTRAVENOUS
  Filled 2023-03-05 (×10): qty 1

## 2023-03-05 MED ORDER — CLONAZEPAM 0.5 MG PO TBDP: 1.0000 mg | ORAL_TABLET | Freq: Once | ORAL | Status: DC

## 2023-03-05 MED ORDER — TOPIRAMATE 25 MG PO TABS: 25.0000 mg | ORAL_TABLET | Freq: Two times a day (BID) | ORAL | Status: DC

## 2023-03-05 NOTE — Plan of Care (Signed)
  Problem: Education: Goal: Knowledge of disease or condition will improve Outcome: Progressing Goal: Knowledge of secondary prevention will improve (MUST DOCUMENT ALL) Outcome: Progressing Goal: Knowledge of patient specific risk factors will improve Alonso N/A or DELETE if not current risk factor) Outcome: Progressing   Problem: Ischemic Stroke/TIA Tissue Perfusion: Goal: Complications of ischemic stroke/TIA will be minimized Outcome: Progressing   Problem: Coping: Goal: Will verbalize positive feelings about self Outcome: Progressing Goal: Will identify appropriate support needs Outcome: Progressing   Problem: Health Behavior/Discharge Planning: Goal: Ability to manage health-related needs will improve Outcome: Progressing Goal: Goals will be collaboratively established with patient/family Outcome: Progressing   Problem: Self-Care: Goal: Ability to participate in self-care as condition permits will improve Outcome: Progressing Goal: Verbalization of feelings and concerns over difficulty with self-care will improve Outcome: Progressing Goal: Ability to communicate needs accurately will improve Outcome: Progressing   Problem: Nutrition: Goal: Risk of aspiration will decrease Outcome: Progressing Goal: Dietary intake will improve Outcome: Progressing   Problem: Education: Goal: Knowledge of General Education information will improve Description: Including pain rating scale, medication(s)/side effects and non-pharmacologic comfort measures Outcome: Progressing   Problem: Health Behavior/Discharge Planning: Goal: Ability to manage health-related needs will improve Outcome: Progressing   Problem: Clinical Measurements: Goal: Ability to maintain clinical measurements within normal limits will improve Outcome: Progressing Goal: Will remain free from infection Outcome: Progressing Goal: Diagnostic test results will improve Outcome: Progressing Goal: Respiratory  complications will improve Outcome: Progressing Goal: Cardiovascular complication will be avoided Outcome: Progressing   Problem: Activity: Goal: Risk for activity intolerance will decrease Outcome: Progressing   Problem: Nutrition: Goal: Adequate nutrition will be maintained Outcome: Progressing   Problem: Coping: Goal: Level of anxiety will decrease Outcome: Progressing   Problem: Elimination: Goal: Will not experience complications related to bowel motility Outcome: Progressing Goal: Will not experience complications related to urinary retention Outcome: Progressing   Problem: Pain Management: Goal: General experience of comfort will improve Outcome: Progressing   Problem: Safety: Goal: Ability to remain free from injury will improve Outcome: Progressing   Problem: Skin Integrity: Goal: Risk for impaired skin integrity will decrease Outcome: Progressing   Problem: Education: Goal: Understanding of CV disease, CV risk reduction, and recovery process will improve Outcome: Progressing Goal: Individualized Educational Video(s) Outcome: Progressing   Problem: Activity: Goal: Ability to return to baseline activity level will improve Outcome: Progressing   Problem: Cardiovascular: Goal: Ability to achieve and maintain adequate cardiovascular perfusion will improve Outcome: Progressing   Problem: Health Behavior/Discharge Planning: Goal: Ability to safely manage health-related needs after discharge will improve Outcome: Progressing

## 2023-03-05 NOTE — Progress Notes (Signed)
 PROGRESS NOTE    Jacqueline [name]  FMW:[id number] DOB: 2001/01/08 DOA: 02/21/2023 PCP: Tonnie Raisin, MD    Chief Complaint  Patient presents with   Code Stroke    Brief Narrative:  Jacqueline [name] is a 23 y.o. 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  The night before presentation, she drank a significant amount of alcohol and took some Suboxone that was not prescribed to her.  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  She went to IR for mechanical thrombectomy, but this was unfortunately unsuccessful.  This morning, patient is noted to have continued expressive aphasia and right hemiparesis.  Patient had a significant episode of agitation early on in the hospitalization, with combativeness requiring use of restraints and initiation of Precedex  drip.  Per patient's boyfriend, she does not drink every day and does not typically use illicit drugs.  Patient subsequently weaned off Precedex  drip and transferred out to ICU.   NIH on Admission 3     SIGNIFICANT HOSPITAL EVENTS 12/20-patient admitted, mechanical thrombectomy attempted but unsuccessful 12/21-significant episode of agitation requiring restraints and Precedex  12/24-off precedex  12/25-continue intermittent fever and agitation, on Abx and resume precedex . Resumed heparin  IV 12/26-repeat MRI showed stroke extension.  12/30-still intermittent agitation, yelling, but distractable. On seroquel  and clonazepam .    Assessment & Plan:   Principal Problem:   Acute ischemic left MCA stroke (HCC) Active Problems:   Anemia   Middle cerebral artery embolism, left   Agitation   Urinary tract infection without hematuria   Lupus anticoagulant disorder (HCC)   Dysphagia   Vitamin B12 deficiency  #1 stroke: Left MCA territory infarct status post unsuccessful IR with extension, etiology: Likely related to  hypercoagulability from positive lupus anticoagulant not on anticoagulation -Patient noted on presentation with concerns for code stroke head CT done with left parietotemporal infarct noted. -Patient underwent CT angiogram head and neck that showed an occluded left proximal to mid M2 MCA. -CT perfusion 21 mm penumbra in the left parietotemporal region. -Patient seen in consultation by neurology and interventional neurology patient status post IR initially T1C13, but progressive worsening stenosis with subsequent complete closure of left M2.  Intermittent mild improvement in caliber with verapamil  and nitroglycerin . -MRI done with an acute left MCA territory infarct without hemorrhagic conversion. -Repeat head CT done showed extension of stroke in the anterior inferior right frontal lobe and right cerebellum. -Patient underwent repeat MRI which showed stroke extension but no hemorrhagic conversion. -Repeat CT head 12/28 showed stable large right MCA and small left MCA/ACA infarcts. -Repeat CT 12/30 showed expected evolutionary changes in the left posterior division left MCA and left ACA/MCA border zone infarcts. -2D echo with normal EF. -EEG done with encephalopathy but no epileptiform activity noted. -Fasting lipid panel with LDL of 42.  Statin currently not indicated.  - Hemoglobin A1c noted at 4.4. -Patient noted not to be on antithrombotic medications prior to admission and started on IV heparin  per neurology. -Patient currently with cortrack and receiving tube feeds, PT OT following and recommending CIR.  2.  Agitation -Patient noted to have some intermittent episodes of agitation however per neurology noted to be distractible with conversation and working with PT and OT. -Patient noted to have required Precedex  drip which was subsequently weaned off initially placed back on Precedex  and currently off Precedex . -Per significant other patient does not frequently drink or use illicit substances  but  still concern for withdrawal given alcohol and Suboxone use prior to admission. -Depakote  level noted at 42. -Was on Depakote  500 every 8 hours subsequently changed to 1000 mg twice daily and changed to 750 every 8 hours. -Noted to be on clonazepam  0.5 mg twice daily and increased to 1 mg twice daily. -Continue Seroquel  50 mg twice daily. -Place on IV Ativan  1 mg every 8 hours as needed. -Follow.  3.  Positive lupus anticoagulant/history of PE -Patient noted to have unprovoked PE 04/2022 noted to have elevated PTT, DRV VT, hexagonal phase phospholipid and positive anticardiolipin IgM and was on Eliquis . -Noted to have followed up with hematology/oncology 07/2022. -Noted that patient stopped taking Eliquis  08/2022. -Anticardiolipin IgM still elevated during this hospitalization. -Per neurology, hematology recommending okay to start anticoagulation once platelet count is greater than 50. -Platelet count improving daily and patient started on IV heparin .  4.  Fever/UTI -Fever curve noted to have trended down. -Urinalysis done concerning for UTI. -Initial urine cultures with 50,000 colonies of E. coli. -Status post full course of IV Rocephin . -Repeat urine cultures with no growth to date. -Blood cultures negative to date. 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 -Outpatient follow-up when medically stable for positive RPR.  5.  BP -BP noted to be soft but improving. -Not on antihypertensive medications prior to admission.  6.  Anemia/thrombocytopenia/B12 deficiency/iron  deficiency -Platelet count improving daily. -Anemia panel concerning for iron  deficiency anemia.  Folate at 9.9.  Vitamin B12 of 122. -Status post IV iron . -Continue oral iron  supplementation and vitamin B12 supplementation. -Outpatient follow-up.  7.  Tobacco abuse -Patient noted to smoke cigars. -When more alert will need discussion about total tobacco cessation.  8.  Substance abuse -It is noted that  patient uses Suboxone. -UDS negative. -Once more alert will need to discuss substance abuse and sensation with patient.  9.  Dysphagia -Cortrak initially placed pulled out, NG tube placed, cortrak placed back in currently on tube feeds at 55. -SLP following.     DVT prophylaxis: Heparin  Code Status: Full Family Communication: Updated boyfriend at bedside. Disposition: TBD  Status is: Inpatient Remains inpatient appropriate because: Severity of illness   Consultants:  PCCM: Dr. Arlinda 02/22/2023 CIR  Procedures:  CT head 02/23/2023, 02/26/2023, 03/01/2023, 03/03/2023 Abdominal films 03/03/2023, 02/28/2023, 02/26/2023, 02/24/2023 Chest x-ray 02/23/2023 MRI brain 02/22/2023, 02/27/2023 Lower extremity Dopplers 02/26/2023 CT angiogram head and neck 02/21/2023 CT head code stroke 02/21/2023 2D echo 02/24/2023 Emergent large vessel occlusion thrombolysis per Dr. Dolphus 02/21/2023 Cortrak placement 02/24/2023 EEG 02/27/2023  Antimicrobials: Anti-infectives (From admission, onward)    Start     Dose/Rate Route Frequency Ordered Stop   02/26/23 1100  cefTRIAXone  (ROCEPHIN ) 2 g in sodium chloride  0.9 % 100 mL IVPB        2 g 200 mL/hr over 30 Minutes Intravenous Every 24 hours 02/26/23 1002 03/01/23 1043   02/24/23 1730  cefTRIAXone  (ROCEPHIN ) 2 g in sodium chloride  0.9 % 100 mL IVPB  Status:  Discontinued        2 g 200 mL/hr over 30 Minutes Intravenous Every 24 hours 02/24/23 1633 02/25/23 0831         Subjective: Patient is sleeping deeply.  Boyfriend at bedside stated she slept better last night and was less agitated last night.  Patient noted to have bouts of agitation when awake with screaming.  Objective: Vitals:   03/04/23 2335 03/05/23 0418 03/05/23 0900 03/05/23 1113  BP: 101/87 94/72 114/74 111/71  Pulse: 85 78  96 90  Resp: 18 18 18 18   Temp: 98.4 F (36.9 C) 98.8 F (37.1 C) 98.1 F (36.7 C) 98.2 F (36.8 C)  TempSrc: Oral Oral Oral Oral  SpO2:  100% 90% 100% 100%  Weight:      Height:        Intake/Output Summary (Last 24 hours) at 03/05/2023 1208 Last data filed at 03/05/2023 0300 Gross per 24 hour  Intake 2261.31 ml  Output --  Net 2261.31 ml   Filed Weights   03/02/23 0400 03/03/23 0500 03/04/23 0500  Weight: 70.6 kg 71.2 kg 75.7 kg    Examination:  General exam: Sleeping.  Cortrak in nares. Respiratory system: CTAB anterior lung fields.  No wheezes, no crackles, no rhonchi.  Fair air movement.  Speaking in full sentences.  Cardiovascular system: Regular rate rhythm no murmurs rubs or gallops.  No JVD.  No lower extremity edema. Gastrointestinal system: Abdomen is nondistended, soft and nontender. No organomegaly or masses felt. Normal bowel sounds heard. Central nervous system: Asleep. Extremities: Symmetric 5 x 5 power. Skin: No rashes, lesions or ulcers Psychiatry: Judgement and insight appear poor l. Mood & affect appropriate.     Data Reviewed: I have personally reviewed following labs and imaging studies  CBC: Recent Labs  Lab 03/01/23 0449 03/02/23 0411 03/03/23 0504 03/04/23 0807 03/05/23 0705  WBC 6.1 8.3 5.7 6.8 5.6  HGB 8.1* 8.5* 8.1* 9.1* 9.2*  HCT 25.7* 26.8* 25.0* 27.9* 28.5*  MCV 73.9* 74.2* 74.2* 75.0* 75.6*  PLT 54* 65* 77* 97* 93*    Basic Metabolic Panel: Recent Labs  Lab 02/26/23 1957 02/27/23 0509 03/02/23 0411 03/03/23 0504 03/04/23 0807 03/05/23 0705  NA  --  141 136 138 135 136  K  --  3.6 4.2 4.2 4.5 4.1  CL  --  109 105 106 102 103  CO2  --  22 22 23 23 22   GLUCOSE  --  119* 117* 114* 110* 95  BUN  --  23* 24* 23* 25* 26*  CREATININE  --  0.77 0.82 0.69 0.76 0.76  CALCIUM   --  8.5* 8.7* 8.8* 9.2 9.1  MG 2.2  --   --   --   --   --   PHOS 4.4  --   --   --   --   --     GFR: Estimated Creatinine Clearance: 108 mL/min (by C-G formula based on SCr of 0.76 mg/dL).  Liver Function Tests: No results for input(s): AST, ALT, ALKPHOS, BILITOT, PROT, ALBUMIN  in the last 168 hours.  CBG: Recent Labs  Lab 03/04/23 2020 03/04/23 2332 03/05/23 0418 03/05/23 0854 03/05/23 1131  GLUCAP 107* 110* 108* 108* 122*     Recent Results (from the past 240 hours)  Culture, blood (Routine X 2) w Reflex to ID Panel     Status: None   Collection Time: 02/24/23 10:44 PM   Specimen: BLOOD RIGHT HAND  Result Value Ref Range Status   Specimen Description BLOOD RIGHT HAND  Final   Special Requests   Final    BOTTLES DRAWN AEROBIC AND ANAEROBIC Blood Culture adequate volume   Culture   Final    NO GROWTH 5 DAYS Performed at [facility], [address], [place], [place] [address]    Report Status 03/01/2023 FINAL  Final  Culture, blood (Routine X 2) w Reflex to ID Panel     Status: None   Collection Time: 02/24/23 10:44 PM  Specimen: BLOOD RIGHT HAND  Result Value Ref Range Status   Specimen Description BLOOD RIGHT HAND  Final   Special Requests   Final    BOTTLES DRAWN AEROBIC AND ANAEROBIC Blood Culture results may not be optimal due to an inadequate volume of blood received in culture bottles   Culture   Final    NO GROWTH 5 DAYS Performed at [facility], [address], [place], [place] [address]    Report Status 03/01/2023 FINAL  Final  Urine Culture (for pregnant, neutropenic or urologic patients or patients with an indwelling urinary catheter)     Status: Abnormal   Collection Time: 02/25/23  1:38 AM   Specimen: Urine, Clean Catch  Result Value Ref Range Status   Specimen Description URINE, CLEAN CATCH  Final   Special Requests   Final    NONE Performed at [facility], [address], [place], [place] [address]    Culture 50,000 COLONIES/mL ESCHERICHIA COLI (A)  Final   Report Status 02/27/2023 FINAL  Final   Organism ID, Bacteria ESCHERICHIA COLI (A)  Final      Susceptibility   Escherichia coli - MIC*    AMPICILLIN >=32 RESISTANT Resistant     CEFAZOLIN  8 SENSITIVE Sensitive     CEFEPIME  <=0.12 SENSITIVE  Sensitive     CEFTRIAXONE  0.5 SENSITIVE Sensitive     CIPROFLOXACIN <=0.25 SENSITIVE Sensitive     GENTAMICIN <=1 SENSITIVE Sensitive     IMIPENEM <=0.25 SENSITIVE Sensitive     NITROFURANTOIN <=16 SENSITIVE Sensitive     TRIMETH/SULFA <=20 SENSITIVE Sensitive     AMPICILLIN/SULBACTAM >=32 RESISTANT Resistant     PIP/TAZO 8 SENSITIVE Sensitive ug/mL    * 50,000 COLONIES/mL ESCHERICHIA COLI  Urine Culture (for pregnant, neutropenic or urologic patients or patients with an indwelling urinary catheter)     Status: None   Collection Time: 03/03/23 10:28 AM   Specimen: Urine, Catheterized  Result Value Ref Range Status   Specimen Description URINE, CATHETERIZED  Final   Special Requests NONE  Final   Culture   Final    NO GROWTH Performed at [facility], [address], [place], [place] [address]    Report Status 03/04/2023 FINAL  Final         Radiology Studies: CT HEAD WO CONTRAST ( ) Result Date: 03/03/2023 CLINICAL DATA:  Stroke, follow up. EXAM: CT HEAD WITHOUT CONTRAST TECHNIQUE: Contiguous axial images were obtained from the base of the skull through the vertex without intravenous contrast. RADIATION DOSE REDUCTION: This exam was performed according to the departmental dose-optimization program which includes automated exposure control, adjustment of the mA and/or kV according to patient size and/or use of iterative reconstruction technique. COMPARISON:  Head CT 03/01/2023. FINDINGS: Brain: Continued evolution of the infarcts in the posterior left MCA territory and left ACA-MCA border zone. No acute hemorrhage or significant mass effect. Unchanged subcortical hypoattenuation deep to the right central sulcus. No hydrocephalus or extra-axial collection. No mass effect or midline shift. 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 Skull: No calvarial fracture or suspicious bone  lesion. Skull base is unremarkable. Sinuses/Orbits: No acute finding. Other: None. IMPRESSION: 1. Continued evolution of the infarcts in the posterior left MCA territory and left ACA-MCA border zone. No acute hemorrhage or significant mass effect. 2. Persistent hyperdense vessel in the left Sylvian fissure, consistent with known reocclusion of the  inferior M2 division of the left MCA. Electronically Signed   By: Ryan Chess M.D.   On: 03/03/2023 16:57        Scheduled Meds:  Chlorhexidine  Gluconate Cloth  6 each Topical Daily   clonazePAM   1 mg Per Tube BID   vitamin B-12  2,000 mcg Per Tube Daily   feeding supplement (PROSource TF20)  60 mL Per Tube BID   folic acid   1 mg Per Tube Daily   iron  polysaccharides  150 mg Per Tube Daily   multivitamin  15 mL Per Tube Daily   mouth rinse  15 mL Mouth Rinse 4 times per day   polyethylene glycol  17 g Per Tube Daily   QUEtiapine   50 mg Per Tube BID   thiamine   100 mg Per Tube Daily   valproic  acid  750 mg Per Tube TID   Continuous Infusions:  feeding supplement (OSMOLITE 1.5 CAL) 55 mL/hr at 03/03/23 2200   heparin  1,200 Units/hr (03/04/23 2143)     LOS: 12 days    Time spent: 35 minutes    Toribio Hummer, MD [facility]   To contact the attending provider between 7A-7P or the covering provider during after hours 7P-7A, please log into the web site [email or web address] and access using universal [place] password for that web site. If you do not have the password, please call the hospital operator.  03/05/2023, 12:08 PM

## 2023-03-05 NOTE — Progress Notes (Signed)
 PHARMACY - ANTICOAGULATION CONSULT NOTE  Pharmacy Consult:  Heparin  Indication: Lupus AC and endoluminal thrombus   No Known Allergies  Patient Measurements: Height: 5' 4 (162.6 cm) Weight: 75.7 kg (166 lb 14.2 oz) IBW/kg (Calculated) : 54.7 Heparin  Dosing Weight: 72 kg  Vital Signs: Temp: 98.8 F (37.1 C) (01/01 0418) Temp Source: Oral (01/01 0418) BP: 94/72 (01/01 0418) Pulse Rate: 78 (01/01 0418)  Labs: Recent Labs    03/03/23 0504 03/04/23 0807 03/05/23 0705  HGB 8.1* 9.1* 9.2*  HCT 25.0* 27.9* 28.5*  PLT 77* 97* 93*  HEPARINUNFRC 0.39 0.48 0.40  CREATININE 0.69 0.76 0.76    Estimated Creatinine Clearance: 108 mL/min (by C-G formula based on SCr of 0.76 mg/dL).   Medical History: Past Medical History:  Diagnosis Date   Bacterial vaginitis 07/10/2016   Candida vaginitis 07/24/2016   Headache in pregnancy, antepartum, third trimester 07/24/2016   Kell isoimmunization during pregnancy     Assessment: 22 YOF presented with acute CVA and MRI also shows endoluminal thrombus.  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 Hx of thrombocytopenia. Pharmacy consulted to dose heparin .  Heparin  level remains therapeutic at 0.4 on 1200 units/hr, CBC ok - Hgb 9s, PLTc 93. No issues with heparin  infusion or signs of bleeding noted per RN  Goal of Therapy:  Heparin  level 0.3-0.5 units/ml Monitor platelets by anticoagulation protocol: Yes   Plan:  Continue heparin  gtt at 1200 units/hr Daily heparin  level, CBC, s/s bleeding   Marget Hench, PharmD PGY1 Pharmacy Resident

## 2023-03-05 NOTE — Progress Notes (Signed)
 STROKE TEAM PROGRESS NOTE   BRIEF HPI [name] is a 23 y.o. 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  The night before presentation, she drank a significant amount of alcohol and took some Suboxone that was not prescribed to her.  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  She went to IR for mechanical thrombectomy, but this was unfortunately unsuccessful.  This morning, patient is noted to have continued expressive aphasia and right hemiparesis.  Patient had a significant episode of agitation this morning with combativeness requiring use of restraints and initiation of Precedex  drip.  Per patient's boyfriend, she does not drink every day and does not typically use illicit drugs.  NIH on Admission 3   SIGNIFICANT HOSPITAL EVENTS 12/20-patient admitted, mechanical thrombectomy attempted but unsuccessful 12/21-significant episode of agitation requiring restraints and Precedex  12/24-off precedex  12/25-continue intermittent fever and agitation, on Abx and resume precedex . Resumed heparin  IV 12/26-repeat MRI showed stroke extension.  12/30-still intermittent agitation, yelling, but distractable. On seroquel  and clonazepam .   INTERIM HISTORY/SUBJECTIVE boyfriend is at the bedside. Pt had a better night last night and was not as agitated.  CMP and CBC are stable.  Heparin  level is adequate. She continues to have tube feeds.  Patient is is alert sleepy this morning and barely arouses to sternal rub remains aphasic with dense right hemiplegia.   OBJECTIVE  CBC    Component Value Date/Time   WBC 5.6 03/05/2023 0705   RBC 3.77 (L) 03/05/2023 0705   HGB 9.2 (L) 03/05/2023 0705   HGB 10.7 (L) 05/21/2017 1026   HCT 28.5 (L) 03/05/2023 0705   HCT 32.4 (L) 05/21/2017 1026   PLT 93 (L) 03/05/2023 0705   PLT 118 (L) 05/21/2017 1026   MCV 75.6 (L) 03/05/2023 0705   MCV 82  05/21/2017 1026   MCH 24.4 (L) 03/05/2023 0705   MCHC 32.3 03/05/2023 0705   RDW 26.6 (H) 03/05/2023 0705   RDW 15.3 05/21/2017 1026   LYMPHSABS 1.6 02/26/2023 0516   LYMPHSABS 2.3 02/28/2017 1618   MONOABS 0.9 02/26/2023 0516   EOSABS 0.1 02/26/2023 0516   EOSABS 0.0 02/28/2017 1618   BASOSABS 0.0 02/26/2023 0516   BASOSABS 0.0 02/28/2017 1618    BMET    Component Value Date/Time   NA 136 03/05/2023 0705   K 4.1 03/05/2023 0705   CL 103 03/05/2023 0705   CO2 22 03/05/2023 0705   GLUCOSE 95 03/05/2023 0705   BUN 26 (H) 03/05/2023 0705   CREATININE 0.76 03/05/2023 0705   CALCIUM  9.1 03/05/2023 0705   GFRNONAA >60 03/05/2023 0705    IMAGING past 24 hours No results found.     Vitals:   03/04/23 2021 03/04/23 2335 03/05/23 0418 03/05/23 0900  BP: 111/71 101/87 94/72 114/74  Pulse: 81 85 78 96  Resp: 18 18 18 18   Temp: 98.1 F (36.7 C) 98.4 F (36.9 C) 98.8 F (37.1 C) 98.1 F (36.7 C)  TempSrc: Oral Oral Oral Oral  SpO2: 100% 100% 90% 100%  Weight:      Height:         PHYSICAL EXAM General: Drowsy, well-nourished, well-developed patient with intermittent agitation, yelling CV: Regular rhythm on monitor but tachycardia Respiratory:  Regular, unlabored respirations on room air  NEURO:  Intermittent agitation with yelling, still lethargic, but slowly open eyes on voice. Nonverbal and not following commands. Eyes left gaze preference, barely  cross midline. Blinking to visual threat on the left but not on the right. Right facial droop with right UE and LE flaccid. RUE and RLE spontaneous movement against gravity. Sensation, coordination and gait not tested.   ASSESSMENT/PLAN  Stroke:  left MCA territory infarct s/p unsuccessful IR with extension, etiology: likely related to hypercoagulability from positive lupus anticoagulant not on [facility] Code Stroke CT head left parietotemporal \\infarct  ASPECTS 7-8 CTA head & neck occluded left proximal to mid M2 MCA CT  perfusion 21 mm penumbra in left parietotemporal region S/p IR - initially TICI3, but progressive worsening stenosis with subsequent complete closure of left M2. Intermittent mild improvement in cailber with verapamil  and nitroglycerine.  MRI acute left MCA territory infarct without hemorrhagic conversion CT repeat likely stroke extension in the anterior inferior right frontal lobe and right cerebellum MRI repeat showed stroke extension but no hemorrhagic conversion. MLS mild < 3mm.  CT repeat 12/28 showed stable large right MCA and small left MCA/ACA infarcts. MLS 2-3mm CT repeat 12/30 expected evolutionary changes in the left posterior division left MCA and left ACA/MCA border zone infarcts.  2D Echo EF 60 to 65%.  Left atrium size normal. EEG severe encephalopathy, no seizure LDL 42 HgbA1c 4.4 UDS neg VTE prophylaxis -SCDs No antithrombotic prior to admission, now on heparin  IV.  Therapy recommendations:  CIR Disposition: Pending  Agitation, episodic Patient had intermittent episodes of agitation, but distractable with conversation and working with PT and OT, concerning for frustration Was on precedex  -> off -> back on precedex  -> off again Per patient's significant other, she does not frequently drink or use illicit substances, but concern for withdrawal still remains given alcohol and Suboxone use prior to admission Depakote  level 42 On depakote  500mg  Q8h -> 1000 bid-changed to 750 every 8 hourly On clonazepam  0.5 bid -> d/c->1mg  bid Seroquel  to 50->25->50 bid  Positive lupus anticoagulant  Hx of 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, DRVVT, 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 - put on eliquis   Followed up with oncology/hematology in 07/2022 Per report, she stopped taking eliquis  in 08/2022 Current admission repeat anti-cardiolipin IgM still elevated  Per hematology, can start anticoagulation when platelet count is greater than 50  now  on heparin  IV  Anemia  Thrombocytopenia B12 deficiency  Iron  deficiency  Hb 8.4->7.6->7.9->7.4->7.9->8.1->8.5->8.1 B12 = 122, folate WNL Platelet 54->44->48->59->48->57->54->65->77 On iron  infusion and B12 supplement CBC monitoring  Fever UTI Tmax 102.2->102.3->101.1->100.7->100.1->afebrile Tachycardia with low BP, need to monitor for sepsis/septic shock UA WBC 21-50, Urine Cx E. Coli, finished rocephin  course UA and U Cx repeat no growth  blood Cx neg RPR again positive 1:1, but T. Pallidum Ab negative CCM on board  BP management Home meds: None BP stable now Close monitoring  Lipid management Home meds: None LDL 42, goal < 70 High intensity statin not indicated as LDL below goal  Tobacco Abuse Patient smokes cigars Will discuss tobacco cessation when patient is alert and oriented  Substance Abuse Patient uses Suboxone UDS negative Will discuss substance abuse and cessation when patient is alert and oriented  Dysphagia Patient has post-stroke dysphagia SLP consulted Currently NPO Cortrak-> pulled off -> NG tube -> cortrak On TF @ 55  Other Stroke Risk Factors ETOH use, alcohol level 14, advised patient to drink no more than 1 drink a day  Other Active Problems Leukocytosis WBC 15.9->11.5->8.1->7.2->6.3->6.7->6.1->8.3->5.7  Hospital day # 12  Patient remains aphasic with right hemiplegia as well as intermittently agitated  despite being on Klonopin , Seroquel  and Depakene .  Recommend check valproic  acid and ammonia level tomorrow.SABRA  Speech therapy for swallow eval.  Mobilize out of bed.  Long discussion with patient and boyfriend at the bedside and answered questions.   Discussed with Dr. Sebastian and patient's boyfriend and answered questions.   Eather Popp, MD   To contact Stroke Continuity provider, please refer to [email or web address]. After hours, contact General Neurology

## 2023-03-06 DIAGNOSIS — R451 Restlessness and agitation: Secondary | ICD-10-CM | POA: Diagnosis not present

## 2023-03-06 DIAGNOSIS — N390 Urinary tract infection, site not specified: Secondary | ICD-10-CM | POA: Diagnosis not present

## 2023-03-06 DIAGNOSIS — D649 Anemia, unspecified: Secondary | ICD-10-CM | POA: Diagnosis not present

## 2023-03-06 DIAGNOSIS — I63512 Cerebral infarction due to unspecified occlusion or stenosis of left middle cerebral artery: Secondary | ICD-10-CM | POA: Diagnosis not present

## 2023-03-06 LAB — VALPROIC ACID LEVEL: Valproic Acid Lvl: 38 ug/mL — ABNORMAL LOW (ref 50.0–100.0)

## 2023-03-06 LAB — BASIC METABOLIC PANEL
Anion gap: 11 (ref 5–15)
BUN: 26 mg/dL — ABNORMAL HIGH (ref 6–20)
CO2: 22 mmol/L (ref 22–32)
Calcium: 9 mg/dL (ref 8.9–10.3)
Chloride: 102 mmol/L (ref 98–111)
Creatinine, Ser: 0.91 mg/dL (ref 0.44–1.00)
GFR, Estimated: >60 mL/min (ref >60)
Glucose, Bld: 105 mg/dL — ABNORMAL HIGH (ref 70–99)
Potassium: 3.9 mmol/L (ref 3.5–5.1)
Sodium: 135 mmol/L (ref 135–145)

## 2023-03-06 LAB — GLUCOSE, CAPILLARY
Glucose-Capillary: 103 mg/dL — ABNORMAL HIGH (ref 70–99)
Glucose-Capillary: 108 mg/dL — ABNORMAL HIGH (ref 70–99)
Glucose-Capillary: 120 mg/dL — ABNORMAL HIGH (ref 70–99)
Glucose-Capillary: 103 mg/dL — ABNORMAL HIGH (ref 70–99)

## 2023-03-06 LAB — HEPARIN LEVEL (UNFRACTIONATED): Heparin Unfractionated: 0.45 [IU]/mL (ref 0.30–0.70)

## 2023-03-06 LAB — CBC
HCT: 31.7 % — ABNORMAL LOW (ref 36.0–46.0)
Hemoglobin: 10.3 g/dL — ABNORMAL LOW (ref 12.0–15.0)
MCH: 24.6 pg — ABNORMAL LOW (ref 26.0–34.0)
MCHC: 32.5 g/dL (ref 30.0–36.0)
MCV: 75.7 fL — ABNORMAL LOW (ref 80.0–100.0)
Platelets: 99 10*3/uL — ABNORMAL LOW (ref 150–400)
RBC: 4.19 MIL/uL (ref 3.87–5.11)
RDW: 27 % — ABNORMAL HIGH (ref 11.5–15.5)
WBC: 6.8 10*3/uL (ref 4.0–10.5)
nRBC: 0 % (ref 0.0–0.2)

## 2023-03-06 LAB — AMMONIA: Ammonia: 30 umol/L (ref 9–35)

## 2023-03-06 MED ORDER — VALPROIC ACID 250 MG/5ML PO SOLN
1000.0000 mg | Freq: Three times a day (TID) | ORAL | Status: DC
  Administered 2023-03-06 – 2023-03-14 (×23): 1000 mg
  Filled 2023-03-06 (×24): qty 20

## 2023-03-06 NOTE — Progress Notes (Signed)
 Occupational Therapy Treatment Patient Details Name: Jacqueline [name] MRN: [id number] DOB: 12-06-2000 Today's Date: 03/06/2023   History of present illness The pt is a 23 yo female presenting 12/20 with expressive aphasia. 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   OT comments  Pt is making limited progress towards their acute OT goals. Session completed with PT to progress pt to OOB. Overall she continues to be significantly limiteg by cognition, communication, arousal, R hemiparesis and active participation. She needed max-total A +2 fro bed mobility, to stand and to pivot to chair. Grooming tasks attempted with no activation or attempt at participation noted. Pt did follow minimal commands ot kick her leg at the end of the session. Increased secretions with drooling and coughing noted throughout . OT to continue to follow acutely to facilitate progress towards established goals. Pt will continue to benefit from intensive inpatient follow up therapy, >3 hours/day after discharge.        If plan is discharge home, recommend the following:  Two people to help with walking and/or transfers;Two people to help with bathing/dressing/bathroom;Assistance with cooking/housework;Direct supervision/assist for medications management;Assistance with feeding;Direct supervision/assist for financial management;Help with stairs or ramp for entrance;Assist for transportation;Supervision due to cognitive status   Equipment Recommendations  Other (comment)       Precautions / Restrictions Precautions Precautions: Fall Precaution Comments: cortrak Restrictions Weight Bearing Restrictions Per Provider Order: No       Mobility Bed Mobility Overal bed mobility: Needs Assistance Bed Mobility: Rolling, Sidelying to Sit Rolling: Max assist, +2 for  physical assistance Sidelying to sit: Max assist, +2 for physical assistance            Transfers Overall transfer level: Needs assistance Equipment used: 2 person hand held assist Transfers: Sit to/from Stand Sit to Stand: Total assist, +2 physical assistance, +2 safety/equipment Stand pivot transfers: Total assist, +2 physical assistance, +2 safety/equipment               Balance Overall balance assessment: Needs assistance Sitting-balance support: Feet supported Sitting balance-Leahy Scale: Poor     Standing balance support: Bilateral upper extremity supported Standing balance-Leahy Scale: Zero                             ADL either performed or assessed with clinical judgement   ADL Overall ADL's : Needs assistance/impaired Eating/Feeding: NPO   Grooming: Total assistance Grooming Details (indicate cue type and reason): hand over hand for washing face and applying lotion                 Toilet Transfer: Total assistance;+2 for physical assistance;+2 for safety/equipment           Functional mobility during ADLs: +2 for physical assistance;Total assistance;+2 for safety/equipment General ADL Comments: no active participation noted throughout session    Extremity/Trunk Assessment Upper Extremity Assessment Upper Extremity Assessment: RUE deficits/detail;LUE deficits/detail RUE Deficits / Details: no AROM noted, tone in elbow. distal extremity is flaccid RUE Sensation: decreased light touch;decreased proprioception RUE Coordination: decreased fine motor;decreased gross motor LUE Deficits / Details: tightness with elbow extension but able to get full PROM. pr guards arm in flexion against body. No attempt at functional use of L LUE Coordination: decreased gross motor;decreased fine motor   Lower Extremity Assessment Lower Extremity Assessment: Defer to PT  evaluation        Vision   Vision Assessment?: Vision impaired- to be further  tested in functional context Additional Comments: tracks to midline and to the L, unable to faciliate R tracking   Perception Perception Perception: Not tested   Praxis Praxis Praxis: Not tested    Cognition Arousal: Lethargic Behavior During Therapy: Flat affect Overall Cognitive Status: Impaired/Different from baseline Area of Impairment: Attention, Following commands, Awareness, Problem solving, Safety/judgement                   Current Attention Level: Sustained   Following Commands: Follows one step commands with increased time, Follows one step commands inconsistently Safety/Judgement: Decreased awareness of safety, Decreased awareness of deficits   Problem Solving: Slow processing, Decreased initiation General Comments: followed very few commands, did not funcationlly participate in basic tasks. tracked therapist ~50% of the time and maximal stim              General Comments VSS, increased secretions and coughing thorughout    Pertinent Vitals/ Pain       Pain Assessment Pain Assessment: Faces Faces Pain Scale: No hurt Pain Intervention(s): Monitored during session   Frequency  Min 1X/week        Progress Toward Goals  OT Goals(current goals can now be found in the care plan section)  Progress towards OT goals: Progressing toward goals  Acute Rehab OT Goals Patient Stated Goal: unable to state OT Goal Formulation: Patient unable to participate in goal setting Time For Goal Achievement: 03/09/23 Potential to Achieve Goals: Good ADL Goals Pt Will Perform Grooming: with mod assist;bed level Pt Will Perform Upper Body Bathing: with max assist;sitting Pt/caregiver will Perform Home Exercise Program: Both right and left upper extremity;With minimal assist;Increased strength Additional ADL Goal #1: Pt will perform bed mobility with Mod A +2 in preparation for ADLs Additional ADL Goal #2: Pt will maintain sitting balance at EOB with Min A for 5 min  in preparation for ADLs  Plan      Co-evaluation    PT/OT/SLP Co-Evaluation/Treatment: Yes Reason for Co-Treatment: Complexity of the patient's impairments (multi-system involvement);Necessary to address cognition/behavior during functional activity;For patient/therapist safety;To address functional/ADL transfers PT goals addressed during session: Mobility/safety with mobility;Balance OT goals addressed during session: ADL's and self-care      AM-PAC OT 6 Clicks Daily Activity     Outcome Measure   Help from another person eating meals?: Total Help from another person taking care of personal grooming?: Total Help from another person toileting, which includes using toliet, bedpan, or urinal?: Total Help from another person bathing (including washing, rinsing, drying)?: Total Help from another person to put on and taking off regular upper body clothing?: Total Help from another person to put on and taking off regular lower body clothing?: Total 6 Click Score: 6    End of Session Equipment Utilized During Treatment: Gait belt  OT Visit Diagnosis: Unsteadiness on feet (R26.81);Other abnormalities of gait and mobility (R26.89);Muscle weakness (generalized) (M62.81);Hemiplegia and hemiparesis;Pain Hemiplegia - Right/Left: Right Hemiplegia - caused by: Cerebral infarction   Activity Tolerance Patient limited by pain   Patient Left in bed;with call bell/phone within reach;with bed alarm set   Nurse Communication Mobility status        Time: 8858-8791 OT Time Calculation (min): 27 min  Charges: OT General Charges $OT Visit: 1 Visit OT Treatments $Therapeutic Activity: 8-22 mins  Jacqueline [name], OTR/L Acute Rehabilitation Services Office [phone] Secure Chat Communication Preferred  Jacqueline [name] 03/06/2023, 2:13 PM

## 2023-03-06 NOTE — Progress Notes (Signed)
 IP rehab admissions - Following for increased participation and tolerance prior to initiating authorization to request inpatient rehab admission.  [phone]

## 2023-03-06 NOTE — Plan of Care (Signed)
  Problem: Coping: Goal: Will verbalize positive feelings about self Outcome: Progressing Goal: Will identify appropriate support needs Outcome: Progressing   Problem: Health Behavior/Discharge Planning: Goal: Ability to manage health-related needs will improve Outcome: Progressing Goal: Goals will be collaboratively established with patient/family Outcome: Progressing   Problem: Education: Goal: Knowledge of disease or condition will improve Outcome: Progressing   Problem: Self-Care: Goal: Ability to participate in self-care as condition permits will improve Outcome: Progressing Goal: Verbalization of feelings and concerns over difficulty with self-care will improve Outcome: Progressing Goal: Ability to communicate needs accurately will improve Outcome: Progressing   Problem: Nutrition: Goal: Risk of aspiration will decrease Outcome: Progressing Goal: Dietary intake will improve Outcome: Progressing

## 2023-03-06 NOTE — Progress Notes (Signed)
 Physical Therapy Treatment Patient Details Name: Jacqueline [name] MRN: [id number] DOB: [birth year] Today's Date: 03/06/2023   History of Present Illness The pt is a 23 yo female presenting 12/20 with expressive aphasia. 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    PT Comments  Patient progressing to OOB this session.  Still needing +2 A but supporting weight and not pushing through L UE/LE.  She still struggles with head control in sitting though supported well while in chair for upright and midline awareness.  She also followed command for kicking L leg straight with multimodal cues.  Patient remains appropriate for inpatient rehab (>3 hours/day).  PT will continue to follow.     If plan is discharge home, recommend the following: Two people to help with walking and/or transfers;Two people to help with bathing/dressing/bathroom;Assistance with cooking/housework;Assistance with feeding;Direct supervision/assist for medications management;Direct supervision/assist for financial management;Assist for transportation;Help with stairs or ramp for entrance;Supervision due to cognitive status   Can travel by private vehicle        Equipment Recommendations  Other (comment) (TBA)    Recommendations for Other Services       Precautions / Restrictions Precautions Precautions: Fall Precaution Comments: coretrak     Mobility  Bed Mobility Overal bed mobility: Needs Assistance Bed Mobility: Rolling, Sidelying to Sit Rolling: Max assist, +2 for physical assistance Sidelying to sit: Max assist, +2 for physical assistance       General bed mobility comments: cues and assist for rolling to R and attempting to put L hand on rail, but pt not grasping, assist for lifting trunk and pt helping with L leg off EOB    Transfers Overall transfer  level: Needs assistance Equipment used: None Transfers: Sit to/from Stand, Bed to chair/wheelchair/BSC Sit to Stand: Max assist, +2 physical assistance Stand pivot transfers: Max assist, +2 physical assistance         General transfer comment: stood from EOB with lifting assist and blocking R knee and for head upright in standing for R hip extension.  Patient not pushing while in standing.  Sat back on EOB then transfer to recliner (drop arm) to L using stand pivot blocking R knee again and guiding hips to chair    Ambulation/Gait               General Gait Details: NT   Stairs             Wheelchair Mobility     Tilt Bed    Modified Rankin (Stroke Patients Only) Modified Rankin (Stroke Patients Only) Pre-Morbid Rankin Score: No symptoms Modified Rankin: Severe disability     Balance Overall balance assessment: Needs assistance Sitting-balance support: Feet supported Sitting balance-Leahy Scale: Poor Sitting balance - Comments: mod a for balance and head control in sitting pt supporting with L hand though not pushing to R today   Standing balance support: Bilateral upper extremity supported Standing balance-Leahy Scale: Zero Standing balance comment: +2 for standing, though able to stay upright for about 10 seconds                            Cognition Arousal: Lethargic Behavior During Therapy: Flat affect Overall Cognitive Status: Impaired/Different from baseline Area of Impairment: Attention, Following commands, Awareness, Problem solving, Safety/judgement  Current Attention Level: Sustained   Following Commands: Follows one step commands with increased time, Follows one step commands inconsistently Safety/Judgement: Decreased awareness of safety, Decreased awareness of deficits   Problem Solving: Slow processing, Decreased initiation General Comments: followed commands for lifting L LE x 3 while in chair, did not  follow commands to hold onto rail with L hand while in sidelying despite physical help to grasp        Exercises      General Comments General comments (skin integrity, edema, etc.): still not controlling her own secretions needing yonker three times during session.  Patient positioned in chair for support and head in midline (tendency to roll to L) RN aware need for A back to bed      Pertinent Vitals/Pain Pain Assessment Pain Assessment: Faces Faces Pain Scale: No hurt    Home Living                          Prior Function            PT Goals (current goals can now be found in the care plan section) Progress towards PT goals: Progressing toward goals    Frequency    Min 1X/week      PT Plan      Co-evaluation PT/OT/SLP Co-Evaluation/Treatment: Yes Reason for Co-Treatment: Complexity of the patient's impairments (multi-system involvement);Necessary to address cognition/behavior during functional activity;For patient/therapist safety;To address functional/ADL transfers PT goals addressed during session: Mobility/safety with mobility;Balance        AM-PAC PT 6 Clicks Mobility   Outcome Measure  Help needed turning from your back to your side while in a flat bed without using bedrails?: Total Help needed moving from lying on your back to sitting on the side of a flat bed without using bedrails?: Total Help needed moving to and from a bed to a chair (including a wheelchair)?: Total Help needed standing up from a chair using your arms (e.g., wheelchair or bedside chair)?: Total Help needed to walk in hospital room?: Total Help needed climbing 3-5 steps with a railing? : Total 6 Click Score: 6    End of Session Equipment Utilized During Treatment: Gait belt Activity Tolerance: Patient limited by lethargy Patient left: in chair;with chair alarm set;with call bell/phone within reach   PT Visit Diagnosis: Other abnormalities of gait and mobility  (R26.89);Hemiplegia and hemiparesis;Other symptoms and signs involving the nervous system (R29.898) Hemiplegia - Right/Left: Right Hemiplegia - dominant/non-dominant: Dominant Hemiplegia - caused by: Cerebral infarction     Time: 1139-1207 PT Time Calculation (min) (ACUTE ONLY): 28 min  Charges:    $Therapeutic Activity: 8-22 mins PT General Charges $$ ACUTE PT VISIT: 1 Visit                     Micheline [name], PT Acute Rehabilitation Services Office:[phone] 03/06/2023    Jacqueline [name] 03/06/2023, 1:37 PM

## 2023-03-06 NOTE — Progress Notes (Signed)
 PHARMACY - ANTICOAGULATION CONSULT NOTE  Pharmacy Consult:  Heparin  Indication: Lupus AC and endoluminal thrombus   No Known Allergies  Patient Measurements: Height: 5' 4 (162.6 cm) Weight: 75.7 kg (166 lb 14.2 oz) IBW/kg (Calculated) : 54.7 Heparin  Dosing Weight: 72 kg  Vital Signs: Temp: 98.2 F (36.8 C) (01/02 0723) Temp Source: Axillary (01/02 0723) BP: 103/67 (01/02 0723) Pulse Rate: 87 (01/02 0723)  Labs: Recent Labs    03/04/23 0807 03/05/23 0705 03/06/23 0510  HGB 9.1* 9.2* 10.3*  HCT 27.9* 28.5* 31.7*  PLT 97* 93* 99*  HEPARINUNFRC 0.48 0.40 0.45  CREATININE 0.76 0.76 0.91    Estimated Creatinine Clearance: 95 mL/min (by C-G formula based on SCr of 0.91 mg/dL).   Medical History: Past Medical History:  Diagnosis Date   Bacterial vaginitis 07/10/2016   Candida vaginitis 07/24/2016   Headache in pregnancy, antepartum, third trimester 07/24/2016   Kell isoimmunization during pregnancy     Assessment: 22 YOF presented with acute CVA and MRI also shows endoluminal thrombus.  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 Hx of thrombocytopenia. Pharmacy consulted to dose heparin .  Heparin  level therapeutic. Plt remains >50k.   Goal of Therapy:  Heparin  level 0.3-0.5 units/ml Monitor platelets by anticoagulation protocol: Yes   Plan:  Continue heparin  gtt at 1200 units/hr Daily heparin  level, CBC, s/s bleeding  Sergio Batch, PharmD, BCIDP, AAHIVP, CPP Infectious Disease Pharmacist 03/06/2023 11:51 AM

## 2023-03-06 NOTE — Progress Notes (Signed)
 Speech Language Pathology Treatment: Dysphagia  Patient Details Name: Jacqueline [name] MRN: [id number] DOB: 2000-10-09 Today's Date: 03/06/2023 Time: 1212-1225 SLP Time Calculation (min) (ACUTE ONLY): 13 min  Assessment / Plan / Recommendation Clinical Impression  Pt was sleepier today than during last visit, but very calm. No vocalizations or verbalizations were noted and she did not follow commands. SLP provided hand-over-hand assist to attempt self-feeding. Acceptance into oral cavity was completely passive, with most liquid spilling back out of her mouth, but she did elicit swallows that were suspected to be delayed. Automaticity of task did not necessarily improve as trials progressed and pt did start to have delayed coughing. Pt is still not quite ready for further swallow testing until she can start to take in more POs to be able to participate in study.    HPI HPI: The pt is a 23 yo female presenting 12/20 with expressive aphasia. 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 MRI showed fairly extensive acute left MCA territory and watershed territory infarcts within the left cerebral hemisphere as well as small acute R frontoparietal cortical/subcortical infarcts. Pt initially passed the Yale swallow screen but become more aphasic and started to cough with PO intake. 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      SLP Plan  Continue with current plan of care      Recommendations for follow up therapy are one component of a multi-disciplinary discharge planning process, led by the attending physician.  Recommendations may be updated based on patient status, additional functional criteria and insurance authorization.    Recommendations  Diet recommendations: NPO (can offer a few ice chips after oral care if accepting) Medication Administration: Via alternative means                   Oral care QID   Frequent or constant Supervision/Assistance Dysphagia, unspecified (R13.10)     Continue with current plan of care     Leita SAILOR., M.A. CCC-SLP Acute Rehabilitation Services Office [phone]  Secure chat preferred   03/06/2023, 1:17 PM

## 2023-03-06 NOTE — Progress Notes (Signed)
 PROGRESS NOTE    Jacqueline [name]  FMW:[id number] DOB: 08/01/2000 DOA: 02/21/2023 PCP: Tonnie Raisin, MD    Chief Complaint  Patient presents with   Code Stroke    Brief Narrative:  Jacqueline [name] is a 23 y.o. 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  The night before presentation, she drank a significant amount of alcohol and took some Suboxone that was not prescribed to her.  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  She went to IR for mechanical thrombectomy, but this was unfortunately unsuccessful.  This morning, patient is noted to have continued expressive aphasia and right hemiparesis.  Patient had a significant episode of agitation early on in the hospitalization, with combativeness requiring use of restraints and initiation of Precedex  drip.  Per patient's boyfriend, she does not drink every day and does not typically use illicit drugs.  Patient subsequently weaned off Precedex  drip and transferred out to ICU.   NIH on Admission 3     SIGNIFICANT HOSPITAL EVENTS 12/20-patient admitted, mechanical thrombectomy attempted but unsuccessful 12/21-significant episode of agitation requiring restraints and Precedex  12/24-off precedex  12/25-continue intermittent fever and agitation, on Abx and resume precedex . Resumed heparin  IV 12/26-repeat MRI showed stroke extension.  12/30-still intermittent agitation, yelling, but distractable. On seroquel  and clonazepam .    Assessment & Plan:   Principal Problem:   Acute ischemic left MCA stroke (HCC) Active Problems:   Anemia   Middle cerebral artery embolism, left   Agitation   Urinary tract infection without hematuria   Lupus anticoagulant disorder (HCC)   Dysphagia   Vitamin B12 deficiency  #1 stroke: Left MCA territory infarct status post unsuccessful IR with extension, etiology: Likely related to  hypercoagulability from positive lupus anticoagulant not on anticoagulation -Patient noted on presentation with concerns for code stroke head CT done with left parietotemporal infarct noted. -Patient underwent CT angiogram head and neck that showed an occluded left proximal to mid M2 MCA. -CT perfusion 21 mm penumbra in the left parietotemporal region. -Patient seen in consultation by neurology and interventional neurology patient status post IR initially T1C13, but progressive worsening stenosis with subsequent complete closure of left M2.  Intermittent mild improvement in caliber with verapamil  and nitroglycerin . -MRI done with an acute left MCA territory infarct without hemorrhagic conversion. -Repeat head CT done showed extension of stroke in the anterior inferior right frontal lobe and right cerebellum. -Patient underwent repeat MRI which showed stroke extension but no hemorrhagic conversion. -Repeat CT head 12/28 showed stable large right MCA and small left MCA/ACA infarcts. -Repeat CT 12/30 showed expected evolutionary changes in the left posterior division left MCA and left ACA/MCA border zone infarcts. -2D echo with normal EF. -EEG done with encephalopathy but no epileptiform activity noted. -Fasting lipid panel with LDL of 42.  Statin currently not indicated.  - Hemoglobin A1c noted at 4.4. -Patient noted not to be on antithrombotic medications prior to admission and started on IV heparin  per neurology. -Neurology recommending continuation of IV heparin  until patient is able to swallow then switching to Eliquis . -Patient currently with cortrack and receiving tube feeds, PT OT following and recommending CIR.  2.  Agitation -Patient noted to have some intermittent episodes of agitation however per neurology noted to be distractible with conversation and working with PT and OT. -Patient noted to have required Precedex  drip which was subsequently weaned off initially placed back on Precedex   and  currently off Precedex . -Per significant other patient does not frequently drink or use illicit substances but still concern for withdrawal given alcohol and Suboxone use prior to admission. -Depakote  level noted at 42. -Was on Depakote  500 every 8 hours subsequently changed to 1000 mg twice daily and changed to 750 every 8 hours. -Noted to be on clonazepam  0.5 mg twice daily and increased to 1 mg twice daily. -Continue Seroquel  50 mg twice daily. -Patient still with bouts of agitation, neurology recommending increase Depakote  to 1000 mg 3 times daily. -Decrease IV Ativan  0.5 mg every 8 hours as needed. -Follow.  3.  Positive lupus anticoagulant/history of PE -Patient noted to have unprovoked PE 04/2022 noted to have elevated PTT, DRV VT, hexagonal phase phospholipid and positive anticardiolipin IgM and was on Eliquis . -Noted to have followed up with hematology/oncology 07/2022. -Noted that patient stopped taking Eliquis  08/2022. -Anticardiolipin IgM still elevated during this hospitalization. -Per neurology, hematology recommending okay to start anticoagulation once platelet count is greater than 50. -Patient started on IV heparin . -Platelet count improving daily.  4.  Fever/UTI -Fever curve noted to have trended down. -Urinalysis done concerning for UTI. -Initial urine cultures with 50,000 colonies of E. coli. -Status post full course of IV Rocephin . -Repeat urine cultures with no growth to date. -Blood cultures negative to date. 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 -Outpatient follow-up when medically stable for positive RPR.  5.  BP -BP noted to be soft but improving. -Not on antihypertensive medications prior to admission. -Follow.  6.  Anemia/thrombocytopenia/B12 deficiency/iron  deficiency -Platelet count improving daily.   -Anemia panel concerning for iron  deficiency anemia.  Folate at 9.9.  Vitamin B12 of 122. -Status post IV iron . -Continue oral iron   supplementation and vitamin B12 supplementation. -Outpatient follow-up.  7.  Tobacco abuse -Patient noted to smoke cigars. -When more alert will need discussion about total tobacco cessation.  8.  Substance abuse -It is noted that patient uses Suboxone. -UDS negative. -Once more alert will need to discuss substance abuse and sensation with patient.  9.  Dysphagia -Cortrak initially placed pulled out, NG tube placed, cortrak placed back in currently on tube feeds at 55. -SLP following.     DVT prophylaxis: Heparin  Code Status: Full Family Communication: No family at bedside. Disposition: TBD  Status is: Inpatient Remains inpatient appropriate because: Severity of illness   Consultants:  PCCM: Dr. Arlinda 02/22/2023 CIR  Procedures:  CT head 02/23/2023, 02/26/2023, 03/01/2023, 03/03/2023 Abdominal films 03/03/2023, 02/28/2023, 02/26/2023, 02/24/2023 Chest x-ray 02/23/2023 MRI brain 02/22/2023, 02/27/2023 Lower extremity Dopplers 02/26/2023 CT angiogram head and neck 02/21/2023 CT head code stroke 02/21/2023 2D echo 02/24/2023 Emergent large vessel occlusion thrombolysis per Dr. Dolphus 02/21/2023 Cortrak placement 02/24/2023 EEG 02/27/2023  Antimicrobials: Anti-infectives (From admission, onward)    Start     Dose/Rate Route Frequency Ordered Stop   02/26/23 1100  cefTRIAXone  (ROCEPHIN ) 2 g in sodium chloride  0.9 % 100 mL IVPB        2 g 200 mL/hr over 30 Minutes Intravenous Every 24 hours 02/26/23 1002 03/01/23 1043   02/24/23 1730  cefTRIAXone  (ROCEPHIN ) 2 g in sodium chloride  0.9 % 100 mL IVPB  Status:  Discontinued        2 g 200 mL/hr over 30 Minutes Intravenous Every 24 hours 02/24/23 1633 02/25/23 0831         Subjective: Sitting up in recliner.  Sedated.  Opens eyes to touch.  Aphasic.   Objective: Vitals:   03/05/23 2336 03/06/23  9642 03/06/23 0723 03/06/23 1258  BP: 115/75 122/72 103/67 (P) 97/68  Pulse: 90 88 87 (P) 98  Resp: 15 18 18  (P)  18  Temp: 97.9 F (36.6 C) 98 F (36.7 C) 98.2 F (36.8 C) (P) 98.4 F (36.9 C)  TempSrc: Axillary Axillary Axillary (P) Axillary  SpO2: 99% 98% 100% (P) 100%  Weight:      Height:        Intake/Output Summary (Last 24 hours) at 03/06/2023 1327 Last data filed at 03/06/2023 0143 Gross per 24 hour  Intake 646.24 ml  Output 0 ml  Net 646.24 ml   Filed Weights   03/02/23 0400 03/03/23 0500 03/04/23 0500  Weight: 70.6 kg 71.2 kg 75.7 kg    Examination:  General exam: Sleeping in recliner.  Cortrak in nares. Respiratory system: Lungs clear to auscultation bilaterally anterior lung fields.  No wheezes, no crackles, no rhonchi.  Fair air movement.  Cardiovascular system: RRR no murmurs rubs or gallops.  No JVD.  No lower extremity edema.  Gastrointestinal system: Abdomen is soft, nontender, nondistended, positive bowel sounds.  No rebound.  No guarding. Central nervous system: Asleep. Extremities: Unable to assess strength.  Skin: No rashes, lesions or ulcers Psychiatry: Judgement and insight appear poor l. Mood & affect appropriate.     Data Reviewed: I have personally reviewed following labs and imaging studies  CBC: Recent Labs  Lab 03/02/23 0411 03/03/23 0504 03/04/23 0807 03/05/23 0705 03/06/23 0510  WBC 8.3 5.7 6.8 5.6 6.8  HGB 8.5* 8.1* 9.1* 9.2* 10.3*  HCT 26.8* 25.0* 27.9* 28.5* 31.7*  MCV 74.2* 74.2* 75.0* 75.6* 75.7*  PLT 65* 77* 97* 93* 99*    Basic Metabolic Panel: Recent Labs  Lab 03/02/23 0411 03/03/23 0504 03/04/23 0807 03/05/23 0705 03/06/23 0510  NA 136 138 135 136 135  K 4.2 4.2 4.5 4.1 3.9  CL 105 106 102 103 102  CO2 22 23 23 22 22   GLUCOSE 117* 114* 110* 95 105*  BUN 24* 23* 25* 26* 26*  CREATININE 0.82 0.69 0.76 0.76 0.91  CALCIUM  8.7* 8.8* 9.2 9.1 9.0    GFR: Estimated Creatinine Clearance: 95 mL/min (by C-G formula based on SCr of 0.91 mg/dL).  Liver Function Tests: No results for input(s): AST, ALT, ALKPHOS, BILITOT,  PROT, ALBUMIN in the last 168 hours.  CBG: Recent Labs  Lab 03/05/23 0854 03/05/23 1131 03/05/23 1548 03/06/23 0722 03/06/23 1257  GLUCAP 108* 122* 135* 103* 108*     Recent Results (from the past 240 hours)  Culture, blood (Routine X 2) w Reflex to ID Panel     Status: None   Collection Time: 02/24/23 10:44 PM   Specimen: BLOOD RIGHT HAND  Result Value Ref Range Status   Specimen Description BLOOD RIGHT HAND  Final   Special Requests   Final    BOTTLES DRAWN AEROBIC AND ANAEROBIC Blood Culture adequate volume   Culture   Final    NO GROWTH 5 DAYS Performed at [facility], [address], [place], [place] [address]    Report Status 03/01/2023 FINAL  Final  Culture, blood (Routine X 2) w Reflex to ID Panel     Status: None   Collection Time: 02/24/23 10:44 PM   Specimen: BLOOD RIGHT HAND  Result Value Ref Range Status   Specimen Description BLOOD RIGHT HAND  Final   Special Requests   Final    BOTTLES DRAWN AEROBIC AND ANAEROBIC Blood Culture results may not be  optimal due to an inadequate volume of blood received in culture bottles   Culture   Final    NO GROWTH 5 DAYS Performed at [facility], [address], [place], [place] [address]    Report Status 03/01/2023 FINAL  Final  Urine Culture (for pregnant, neutropenic or urologic patients or patients with an indwelling urinary catheter)     Status: Abnormal   Collection Time: 02/25/23  1:38 AM   Specimen: Urine, Clean Catch  Result Value Ref Range Status   Specimen Description URINE, CLEAN CATCH  Final   Special Requests   Final    NONE Performed at [facility], [address], [place], [place] [address]    Culture 50,000 COLONIES/mL ESCHERICHIA COLI (A)  Final   Report Status 02/27/2023 FINAL  Final   Organism ID, Bacteria ESCHERICHIA COLI (A)  Final      Susceptibility   Escherichia coli - MIC*    AMPICILLIN >=32 RESISTANT Resistant     CEFAZOLIN  8 SENSITIVE Sensitive     CEFEPIME   <=0.12 SENSITIVE Sensitive     CEFTRIAXONE  0.5 SENSITIVE Sensitive     CIPROFLOXACIN <=0.25 SENSITIVE Sensitive     GENTAMICIN <=1 SENSITIVE Sensitive     IMIPENEM <=0.25 SENSITIVE Sensitive     NITROFURANTOIN <=16 SENSITIVE Sensitive     TRIMETH/SULFA <=20 SENSITIVE Sensitive     AMPICILLIN/SULBACTAM >=32 RESISTANT Resistant     PIP/TAZO 8 SENSITIVE Sensitive ug/mL    * 50,000 COLONIES/mL ESCHERICHIA COLI  Urine Culture (for pregnant, neutropenic or urologic patients or patients with an indwelling urinary catheter)     Status: None   Collection Time: 03/03/23 10:28 AM   Specimen: Urine, Catheterized  Result Value Ref Range Status   Specimen Description URINE, CATHETERIZED  Final   Special Requests NONE  Final   Culture   Final    NO GROWTH Performed at [facility], [address], [place], [place] [address]    Report Status 03/04/2023 FINAL  Final         Radiology Studies: No results found.       Scheduled Meds:  Chlorhexidine  Gluconate Cloth  6 each Topical Daily   clonazepam   1 mg Oral Once   clonazePAM   1 mg Per Tube BID   vitamin B-12  2,000 mcg Per Tube Daily   feeding supplement (PROSource TF20)  60 mL Per Tube BID   folic acid   1 mg Per Tube Daily   iron  polysaccharides  150 mg Per Tube Daily   multivitamin  15 mL Per Tube Daily   mouth rinse  15 mL Mouth Rinse 4 times per day   polyethylene glycol  17 g Per Tube Daily   QUEtiapine   50 mg Per Tube BID   thiamine   100 mg Per Tube Daily   valproic  acid  750 mg Per Tube TID   Continuous Infusions:  feeding supplement (OSMOLITE 1.5 CAL) 55 mL/hr at 03/06/23 0143   heparin  1,200 Units/hr (03/06/23 0143)     LOS: 13 days    Time spent: 35 minutes    Toribio Hummer, MD [facility]   To contact the attending provider between 7A-7P or the covering provider during after hours 7P-7A, please log into the web site [email or web address] and access using universal [place] password for that web  site. If you do not have the password, please call the hospital operator.  03/06/2023, 1:27 PM

## 2023-03-06 NOTE — Progress Notes (Signed)
 STROKE TEAM PROGRESS NOTE   BRIEF HPI Ms. Jemila A Rowton is a 23 y.o. 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  The night before presentation, she drank a significant amount of alcohol and took some Suboxone that was not prescribed to her.  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  She went to IR for mechanical thrombectomy, but this was unfortunately unsuccessful.  This morning, patient is noted to have continued expressive aphasia and right hemiparesis.  Patient had a significant episode of agitation this morning with combativeness requiring use of restraints and initiation of Precedex  drip.  Per patient's boyfriend, she does not drink every day and does not typically use illicit drugs.  NIH on Admission 3   SIGNIFICANT HOSPITAL EVENTS 12/20-patient admitted, mechanical thrombectomy attempted but unsuccessful 12/21-significant episode of agitation requiring restraints and Precedex  12/24-off precedex  12/25-continue intermittent fever and agitation, on Abx and resume precedex . Resumed heparin  IV 12/26-repeat MRI showed stroke extension.  12/30-still intermittent agitation, yelling, but distractable. On seroquel  and clonazepam .   INTERIM HISTORY/SUBJECTIVE No one is at the bedside. Pt is awake today and not agitated.  CMP and CBC are stable.  Heparin  level is adequate. She continues to have tube feeds.  Patient  remains aphasic with dense right hemiplegia.  Speech therapy had recommended n.p.o. status..  Valproic  acid level is suboptimal.  Ammonia level is normal OBJECTIVE  CBC    Component Value Date/Time   WBC 6.8 03/06/2023 0510   RBC 4.19 03/06/2023 0510   HGB 10.3 (L) 03/06/2023 0510   HGB 10.7 (L) 05/21/2017 1026   HCT 31.7 (L) 03/06/2023 0510   HCT 32.4 (L) 05/21/2017 1026   PLT 99 (L) 03/06/2023 0510   PLT 118 (L) 05/21/2017 1026   MCV 75.7 (L) 03/06/2023 0510    MCV 82 05/21/2017 1026   MCH 24.6 (L) 03/06/2023 0510   MCHC 32.5 03/06/2023 0510   RDW 27.0 (H) 03/06/2023 0510   RDW 15.3 05/21/2017 1026   LYMPHSABS 1.6 02/26/2023 0516   LYMPHSABS 2.3 02/28/2017 1618   MONOABS 0.9 02/26/2023 0516   EOSABS 0.1 02/26/2023 0516   EOSABS 0.0 02/28/2017 1618   BASOSABS 0.0 02/26/2023 0516   BASOSABS 0.0 02/28/2017 1618    BMET    Component Value Date/Time   NA 135 03/06/2023 0510   K 3.9 03/06/2023 0510   CL 102 03/06/2023 0510   CO2 22 03/06/2023 0510   GLUCOSE 105 (H) 03/06/2023 0510   BUN 26 (H) 03/06/2023 0510   CREATININE 0.91 03/06/2023 0510   CALCIUM  9.0 03/06/2023 0510   GFRNONAA >60 03/06/2023 0510    IMAGING past 24 hours No results found.     Vitals:   03/05/23 2336 03/06/23 0357 03/06/23 0723 03/06/23 1258  BP: 115/75 122/72 103/67 97/68  Pulse: 90 88 87 98  Resp: 15 18 18 18   Temp: 97.9 F (36.6 C) 98 F (36.7 C) 98.2 F (36.8 C) 98.4 F (36.9 C)  TempSrc: Axillary Axillary Axillary Axillary  SpO2: 99% 98% 100% 100%  Weight:      Height:         PHYSICAL EXAM General: Drowsy, well-nourished, well-developed patient with intermittent agitation, yelling CV: Regular rhythm on monitor but tachycardia Respiratory:  Regular, unlabored respirations on room air  NEURO:  Intermittent agitation with yelling, still lethargic, but slowly open eyes on voice. Nonverbal and not following commands. Eyes left gaze preference,  barely cross midline. Blinking to visual threat on the left but not on the right. Right facial droop with right UE and LE flaccid. RUE and RLE spontaneous movement against gravity. Sensation, coordination and gait not tested.   ASSESSMENT/PLAN  Stroke:  left MCA territory infarct s/p unsuccessful IR with extension, etiology: likely related to hypercoagulability from positive lupus anticoagulant not on [facility] Code Stroke CT head left parietotemporal \\infarct  ASPECTS 7-8 CTA head & neck occluded left  proximal to mid M2 MCA CT perfusion 21 mm penumbra in left parietotemporal region S/p IR - initially TICI3, but progressive worsening stenosis with subsequent complete closure of left M2. Intermittent mild improvement in cailber with verapamil  and nitroglycerine.  MRI acute left MCA territory infarct without hemorrhagic conversion CT repeat likely stroke extension in the anterior inferior right frontal lobe and right cerebellum MRI repeat showed stroke extension but no hemorrhagic conversion. MLS mild < 3mm.  CT repeat 12/28 showed stable large right MCA and small left MCA/ACA infarcts. MLS 2-3mm CT repeat 12/30 expected evolutionary changes in the left posterior division left MCA and left ACA/MCA border zone infarcts.  2D Echo EF 60 to 65%.  Left atrium size normal. EEG severe encephalopathy, no seizure LDL 42 HgbA1c 4.4 UDS neg VTE prophylaxis -SCDs No antithrombotic prior to admission, now on heparin  IV.  Therapy recommendations:  CIR Disposition: Pending  Agitation, episodic Patient had intermittent episodes of agitation, but distractable with conversation and working with PT and OT, concerning for frustration Was on precedex  -> off -> back on precedex  -> off again Per patient's significant other, she does not frequently drink or use illicit substances, but concern for withdrawal still remains given alcohol and Suboxone use prior to admission Depakote  level 42 On depakote  500mg  Q8h -> 1000 bid-changed to 1000 every 8 hourly On clonazepam  0.5 bid -> d/c->1mg  bid Seroquel  to 50->25->50 bid  Positive lupus anticoagulant  Hx of 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, DRVVT, 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 - put on eliquis   Followed up with oncology/hematology in 07/2022 Per report, she stopped taking eliquis  in 08/2022 Current admission repeat anti-cardiolipin IgM still elevated  Per hematology, can start anticoagulation when platelet  count is greater than 50  now on heparin  IV  Anemia  Thrombocytopenia B12 deficiency  Iron  deficiency  Hb 8.4->7.6->7.9->7.4->7.9->8.1->8.5->8.1 B12 = 122, folate WNL Platelet 54->44->48->59->48->57->54->65->77 On iron  infusion and B12 supplement CBC monitoring  Fever UTI Tmax 102.2->102.3->101.1->100.7->100.1->afebrile Tachycardia with low BP, need to monitor for sepsis/septic shock UA WBC 21-50, Urine Cx E. Coli, finished rocephin  course UA and U Cx repeat no growth  blood Cx neg RPR again positive 1:1, but T. Pallidum Ab negative CCM on board  BP management Home meds: None BP stable now Close monitoring  Lipid management Home meds: None LDL 42, goal < 70 High intensity statin not indicated as LDL below goal  Tobacco Abuse Patient smokes cigars Will discuss tobacco cessation when patient is alert and oriented  Substance Abuse Patient uses Suboxone UDS negative Will discuss substance abuse and cessation when patient is alert and oriented  Dysphagia Patient has post-stroke dysphagia SLP consulted Currently NPO Cortrak-> pulled off -> NG tube -> cortrak On TF @ 55  Other Stroke Risk Factors ETOH use, alcohol level 14, advised patient to drink no more than 1 drink a day  Other Active Problems Leukocytosis WBC 15.9->11.5->8.1->7.2->6.3->6.7->6.1->8.3->5.7  Hospital day # 13  Patient remains aphasic with right hemiplegia as well as intermittently  agitated despite being on Klonopin , Seroquel  and Depakene .  Recommend increase valproic  acid to 1 g every 8 hourly...  Continue IV heparin  till patient is able to swallow then switch to Eliquis  continue n.p.o. status and tube feeds as per speech therapy.  Mobilize out of bed.  Await transfer to rehab when bed available   Eather Popp, MD   To contact Stroke Continuity provider, please refer to [email or web address]. After hours, contact General Neurology

## 2023-03-06 NOTE — Progress Notes (Signed)
 Patient is attempting to slide out of chair.  She is moaning.  Three assist to get pt back to bed.  Pt did not move her legs or hold her weight.

## 2023-03-06 NOTE — Plan of Care (Signed)
  Problem: Clinical Measurements: Goal: Will remain free from infection Outcome: Progressing Goal: Cardiovascular complication will be avoided Outcome: Progressing   Problem: Nutrition: Goal: Adequate nutrition will be maintained Outcome: Progressing

## 2023-03-06 NOTE — Plan of Care (Signed)
  Problem: Education: Goal: Knowledge of disease or condition will improve 03/06/2023 0255 by Brien Romualdo BRAVO, RN Outcome: Progressing 03/06/2023 0254 by Brien Romualdo BRAVO, RN Outcome: Progressing Goal: Knowledge of secondary prevention will improve (MUST DOCUMENT ALL) 03/06/2023 0255 by Brien Romualdo BRAVO, RN Outcome: Progressing 03/06/2023 0254 by Brien Romualdo BRAVO, RN Outcome: Progressing Goal: Knowledge of patient specific risk factors will improve Alonso N/A or DELETE if not current risk factor) 03/06/2023 0255 by Brien Romualdo BRAVO, RN Outcome: Progressing 03/06/2023 0254 by Brien Romualdo BRAVO, RN Outcome: Progressing   Problem: Ischemic Stroke/TIA Tissue Perfusion: Goal: Complications of ischemic stroke/TIA will be minimized 03/06/2023 0255 by Brien Romualdo BRAVO, RN Outcome: Progressing 03/06/2023 0254 by Brien Romualdo BRAVO, RN Outcome: Progressing   Problem: Coping: Goal: Will verbalize positive feelings about self 03/06/2023 0255 by Brien Romualdo BRAVO, RN Outcome: Progressing 03/06/2023 0254 by Brien Romualdo BRAVO, RN Outcome: Progressing Goal: Will identify appropriate support needs 03/06/2023 0255 by Brien Romualdo BRAVO, RN Outcome: Progressing 03/06/2023 0254 by Brien Romualdo BRAVO, RN Outcome: Progressing

## 2023-03-07 ENCOUNTER — Inpatient Hospital Stay (HOSPITAL_COMMUNITY): Payer: Medicaid Other

## 2023-03-07 DIAGNOSIS — R55 Syncope and collapse: Secondary | ICD-10-CM

## 2023-03-07 DIAGNOSIS — R2990 Unspecified symptoms and signs involving the nervous system: Secondary | ICD-10-CM | POA: Diagnosis not present

## 2023-03-07 DIAGNOSIS — D649 Anemia, unspecified: Secondary | ICD-10-CM | POA: Diagnosis not present

## 2023-03-07 DIAGNOSIS — I63512 Cerebral infarction due to unspecified occlusion or stenosis of left middle cerebral artery: Secondary | ICD-10-CM | POA: Diagnosis not present

## 2023-03-07 LAB — BASIC METABOLIC PANEL
Anion gap: 8 (ref 5–15)
BUN: 25 mg/dL — ABNORMAL HIGH (ref 6–20)
CO2: 24 mmol/L (ref 22–32)
Calcium: 9.2 mg/dL (ref 8.9–10.3)
Chloride: 105 mmol/L (ref 98–111)
Creatinine, Ser: 0.84 mg/dL (ref 0.44–1.00)
GFR, Estimated: >60 mL/min (ref >60)
Glucose, Bld: 104 mg/dL — ABNORMAL HIGH (ref 70–99)
Potassium: 4 mmol/L (ref 3.5–5.1)
Sodium: 137 mmol/L (ref 135–145)

## 2023-03-07 LAB — GLUCOSE, CAPILLARY
Glucose-Capillary: 104 mg/dL — ABNORMAL HIGH (ref 70–99)
Glucose-Capillary: 100 mg/dL — ABNORMAL HIGH (ref 70–99)
Glucose-Capillary: 106 mg/dL — ABNORMAL HIGH (ref 70–99)
Glucose-Capillary: 130 mg/dL — ABNORMAL HIGH (ref 70–99)
Glucose-Capillary: 112 mg/dL — ABNORMAL HIGH (ref 70–99)
Glucose-Capillary: 89 mg/dL (ref 70–99)

## 2023-03-07 LAB — CBC
HCT: 31 % — ABNORMAL LOW (ref 36.0–46.0)
Hemoglobin: 10.1 g/dL — ABNORMAL LOW (ref 12.0–15.0)
MCH: 24.8 pg — ABNORMAL LOW (ref 26.0–34.0)
MCHC: 32.6 g/dL (ref 30.0–36.0)
MCV: 76 fL — ABNORMAL LOW (ref 80.0–100.0)
Platelets: 98 10*3/uL — ABNORMAL LOW (ref 150–400)
RBC: 4.08 MIL/uL (ref 3.87–5.11)
RDW: 27 % — ABNORMAL HIGH (ref 11.5–15.5)
WBC: 7 10*3/uL (ref 4.0–10.5)
nRBC: 0 % (ref 0.0–0.2)

## 2023-03-07 LAB — HEPARIN LEVEL (UNFRACTIONATED)
Heparin Unfractionated: 0.56 [IU]/mL (ref 0.30–0.70)
Heparin Unfractionated: 0.36 [IU]/mL (ref 0.30–0.70)

## 2023-03-07 NOTE — Progress Notes (Signed)
 IP rehab admisisons - Patient asleep when I rounded this am.  I called mom to discuss rehab options.  I explained CIR to mom.  Mom would like CIR with plan to return to mom's apartment for help and supervision.  Mom has 10 children who can also assist.  Patient has 2 children ages 84 and 20 who are with patient's mom.  I spoke with rehab MD Dr. Emeline and we feel patient is not ready yet for CIR.  I will have a partner follow up next week to determine readiness.  [phone]

## 2023-03-07 NOTE — Progress Notes (Signed)
 PHARMACY - ANTICOAGULATION CONSULT NOTE  Pharmacy Consult:  Heparin  Indication: Lupus AC and endoluminal thrombus   No Known Allergies  Patient Measurements: Height: 5' 4 (162.6 cm) Weight: 75.7 kg (166 lb 14.2 oz) IBW/kg (Calculated) : 54.7 Heparin  Dosing Weight: 72 kg  Vital Signs: Temp: 98.1 F (36.7 C) (01/03 1504) Temp Source: Axillary (01/03 1504) BP: 97/67 (01/03 1504) Pulse Rate: 98 (01/03 1504)  Labs: Recent Labs    03/05/23 0705 03/06/23 0510 03/07/23 0742 03/07/23 1607  HGB 9.2* 10.3* 10.1*  --   HCT 28.5* 31.7* 31.0*  --   PLT 93* 99* 98*  --   HEPARINUNFRC 0.40 0.45 0.56 0.36  CREATININE 0.76 0.91 0.84  --     Estimated Creatinine Clearance: 102.9 mL/min (by C-G formula based on SCr of 0.84 mg/dL).   Medical History: Past Medical History:  Diagnosis Date   Bacterial vaginitis 07/10/2016   Candida vaginitis 07/24/2016   Headache in pregnancy, antepartum, third trimester 07/24/2016   Kell isoimmunization during pregnancy     Assessment: 22 YOF presented with acute CVA and MRI also shows endoluminal thrombus.  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 Hx of thrombocytopenia. Pharmacy consulted to dose heparin .  Heparin  level therapeutic at 0.36. Plt remains >50k. Hb 10.1.   Goal of Therapy:  Heparin  level 0.3-0.5 units/ml Monitor platelets by anticoagulation protocol: Yes   Plan:  Continue heparin  gtt at 1150 units/hr Daily heparin  level, CBC, s/s bleeding  Rankin Sams, PharmD, BCPS, BCCCP Clinical Pharmacist

## 2023-03-07 NOTE — Progress Notes (Signed)
 TCD bubble study completed. Please see CV Procedures for preliminary results.  Shona Simpson, RVT 03/07/23 1:48 PM

## 2023-03-07 NOTE — Progress Notes (Signed)
 Physical Therapy Treatment Patient Details Name: Jacqueline [name] MRN: [id number] DOB: [birth year] Today's Date: 03/07/2023   History of Present Illness The pt is a 23 yo female presenting 12/20 with expressive aphasia. 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    PT Comments  Patient initially moaning, but calmed easily with therapist in the room.  She initiated coming up to sit using L hand to pull up and once in chair using L UE to tussle her hair.  She needed increased A for sitting balance today, but seemed distracted looking up at TV, though demonstrated improved head control.  She did not follow multimodal cues for reaching task while sitting EOB though did follow cue to rest hand on pillow.  Patient will need increased intensity of therapy for progression and eventual return home.  PT will update goals after next session and anticipate initiating standing with rail support in hallway to progress to pre-gait activities.     If plan is discharge home, recommend the following: Two people to help with walking and/or transfers;Two people to help with bathing/dressing/bathroom;Assistance with cooking/housework;Assistance with feeding;Direct supervision/assist for medications management;Direct supervision/assist for financial management;Assist for transportation;Help with stairs or ramp for entrance;Supervision due to cognitive status   Can travel by private vehicle        Equipment Recommendations  Other (comment) (TBA)    Recommendations for Other Services       Precautions / Restrictions Precautions Precautions: Fall Precaution Comments: cortrak     Mobility  Bed Mobility Overal bed mobility: Needs Assistance Bed Mobility: Rolling, Sidelying to Sit Rolling: Mod assist Sidelying to sit: Max assist, +2 for safety/equipment        General bed mobility comments: up to EOB pt pulling some to help lift trunk with L UE    Transfers Overall transfer level: Needs assistance Equipment used: 2 person hand held assist Transfers: Sit to/from Stand, Bed to chair/wheelchair/BSC Sit to Stand: Max assist, +2 physical assistance Stand pivot transfers: Max assist, +2 physical assistance         General transfer comment: stood from EOB with lifting help, R knee and hip block; weight shifted R for stepping with L with A though R knee buckled so lowered in chair    Ambulation/Gait                   Stairs             Wheelchair Mobility     Tilt Bed    Modified Rankin (Stroke Patients Only) Modified Rankin (Stroke Patients Only) Pre-Morbid Rankin Score: No symptoms Modified Rankin: Severe disability     Balance Overall balance assessment: Needs assistance Sitting-balance support: Feet supported Sitting balance-Leahy Scale: Poor Sitting balance - Comments: mod to max A for sitting balance today leaning to R; propped on L elbow at times and facilitated to upright, though not maintaining midline; multimodal cues for reaching with L UE to tap tech's hand, though pt not initiating   Standing balance support: Bilateral upper extremity supported Standing balance-Leahy Scale: Zero Standing balance comment: +2 for standing                            Cognition Arousal: Lethargic Behavior During Therapy: Flat affect Overall Cognitive Status: Impaired/Different from baseline Area of Impairment: Attention, Following commands, Awareness,  Problem solving, Safety/judgement                   Current Attention Level: Sustained   Following Commands: Follows one step commands with increased time, Follows one step commands inconsistently Safety/Judgement: Decreased awareness of safety, Decreased awareness of deficits   Problem Solving: Slow processing, Decreased initiation General  Comments: initiated up to EOB pulling some with L UE, initially moaning, but once therapist in the room and talking with pt she calmed; looking up at TV during session        Exercises      General Comments General comments (skin integrity, edema, etc.): VSS on RA, positioned in chair with lift pad for staff to use for back to bed      Pertinent Vitals/Pain Pain Assessment Pain Assessment: Faces Faces Pain Scale: No hurt    Home Living                          Prior Function            PT Goals (current goals can now be found in the care plan section) Progress towards PT goals: Progressing toward goals    Frequency    Min 1X/week      PT Plan      Co-evaluation              AM-PAC PT 6 Clicks Mobility   Outcome Measure  Help needed turning from your back to your side while in a flat bed without using bedrails?: Total Help needed moving from lying on your back to sitting on the side of a flat bed without using bedrails?: Total Help needed moving to and from a bed to a chair (including a wheelchair)?: Total Help needed standing up from a chair using your arms (e.g., wheelchair or bedside chair)?: Total Help needed to walk in hospital room?: Total Help needed climbing 3-5 steps with a railing? : Total 6 Click Score: 6    End of Session Equipment Utilized During Treatment: Gait belt Activity Tolerance: Patient limited by fatigue Patient left: in chair;with chair alarm set;with call bell/phone within reach Nurse Communication: Mobility status;Need for lift equipment PT Visit Diagnosis: Other abnormalities of gait and mobility (R26.89);Hemiplegia and hemiparesis;Other symptoms and signs involving the nervous system (R29.898) Hemiplegia - Right/Left: Right Hemiplegia - dominant/non-dominant: Dominant Hemiplegia - caused by: Cerebral infarction     Time: 1430-1458 PT Time Calculation (min) (ACUTE ONLY): 28 min  Charges:    $Therapeutic  Activity: 23-37 mins PT General Charges $$ ACUTE PT VISIT: 1 Visit                     Jacqueline [name], PT Acute Rehabilitation Services Office:[phone] 03/07/2023    Jacqueline [name] 03/07/2023, 4:57 PM

## 2023-03-07 NOTE — Hospital Course (Addendum)
 23 y.o. F with hx thrombocytopenia, post-partum hemorrhage, PE and positive lupus AC lost to Hematology follow up who presented with stroke.   Night before admission, drank a significant amount of alcohol and took some Suboxone that was not prescribed to her.     In the morning, had expressive aphasia and was brought to ER.  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  She went to IR for mechanical thrombectomy, but this was unfortunately unsuccessful.   12/21 to 12/24 patient required Precedex .  12/26 MRI brain showed extension of hemorrhage.      Hospitalization complicated by persistent difficulty with agitation and aphasia.         Assessment and Plan: Acute ischemic stroke - PT/OT     Agitation - Continue clonazepam  taper - Stop Depakote  - Stop Haldol , Ativan , and Seroquel    Lupus anticoagulant History of pulmonary embolism - Stop heparin  - Start Eliquis  -Follow-up with rheumatology   UTI Resolved   Anemia Thrombocytopenia Stable - Outpatient hematology and rheumatology follow up             Subjective: No change, no nursing concerns.         Physical Exam:  Adult female, lying in bed, staring.  Makes eye contact briefly when I approach her left side.     RRR, nl S1-S2, no murmurs appreciated.   No peripheral edema.   Normal respiratory rate and rhythm.  CTAB without rales or wheezes. Abdomen soft without grimace to palpation.  No distension or HSM.   No intelligible verbalizations.  Does not appear to comprehend or follow verbal commands.  Right hemiparesis is dense.         Data Reviewed:     Family Communication: Boyfriend at the bedside

## 2023-03-07 NOTE — Plan of Care (Signed)
 Will continue to monitor.

## 2023-03-07 NOTE — Progress Notes (Signed)
 Progress Note   Patient: Jacqueline [name] FMW:[id number] DOB: [birth year] DOA: 02/21/2023     14 DOS: the patient was seen and examined on 03/07/2023   Brief hospital course: 23 y.o. 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  The night before presentation, she drank a significant amount of alcohol and took some Suboxone that was not prescribed to her.  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  She went to IR for mechanical thrombectomy, but this was unfortunately unsuccessful.  This morning, patient is noted to have continued expressive aphasia and right hemiparesis.  Patient had a significant episode of agitation early on in the hospitalization, with combativeness requiring use of restraints and initiation of Precedex  drip.  Per patient's boyfriend, she does not drink every day and does not typically use illicit drugs.  Patient subsequently weaned off Precedex  drip and transferred out to ICU.   NIH on Admission 3     SIGNIFICANT HOSPITAL EVENTS 12/20-patient admitted, mechanical thrombectomy attempted but unsuccessful 12/21-significant episode of agitation requiring restraints and Precedex  12/24-off precedex  12/25-continue intermittent fever and agitation, on Abx and resume precedex . Resumed heparin  IV 12/26-repeat MRI showed stroke extension.  12/30-still intermittent agitation, yelling, but distractable. On seroquel  and clonazepam .     Assessment and Plan: #1 stroke: Left MCA territory infarct status post unsuccessful IR with extension, etiology: Likely related to hypercoagulability from positive lupus anticoagulant not on anticoagulation -Patient noted on presentation with concerns for code stroke head CT done with left parietotemporal infarct noted. -Patient underwent CT angiogram head and neck that showed an occluded left proximal to mid M2 MCA. -CT perfusion 21 mm penumbra  in the left parietotemporal region. -Patient seen in consultation by neurology and interventional neurology patient status post IR initially T1C13, but progressive worsening stenosis with subsequent complete closure of left M2.  Intermittent mild improvement in caliber with verapamil  and nitroglycerin . -MRI done with an acute left MCA territory infarct without hemorrhagic conversion. -Repeat head CT done showed extension of stroke in the anterior inferior right frontal lobe and right cerebellum. -Patient underwent repeat MRI which showed stroke extension but no hemorrhagic conversion. -Repeat CT head 12/28 showed stable large right MCA and small left MCA/ACA infarcts. -Repeat CT 12/30 showed expected evolutionary changes in the left posterior division left MCA and left ACA/MCA border zone infarcts. -2D echo with normal EF. -EEG done with encephalopathy but no epileptiform activity noted. -Fasting lipid panel with LDL of 42.  Statin currently not indicated.  - Hemoglobin A1c noted at 4.4. -Patient noted not to be on antithrombotic medications prior to admission and started on IV heparin  per neurology. -Neurology recommending continuation of IV heparin  until patient is able to swallow then switching to Eliquis . -Patient currently with cortrack and receiving tube feeds, PT OT following and recommending CIR. Pending   2.  Agitation -Patient noted to have some intermittent episodes of agitation however per neurology noted to be distractible with conversation and working with PT and OT. -Patient noted to have required Precedex  drip which was subsequently weaned off initially placed back on Precedex  and currently off Precedex . -Per significant other patient does not frequently drink or use illicit substances but still concern for withdrawal given alcohol and Suboxone use prior to admission. -Depakote  level noted at 42. -Was on Depakote  500 every 8 hours subsequently changed to 1000 mg twice daily and  changed to 750 every 8 hours. -Noted to be  on clonazepam  0.5 mg twice daily and increased to 1 mg twice daily. -Continue Seroquel  50 mg twice daily. -Patient still with bouts of agitation, neurology recommending increase Depakote  to 1000 mg 3 times daily. -Decreased IV Ativan  0.5 mg every 8 hours as needed. -Cont to    3.  Positive lupus anticoagulant/history of PE -Patient noted to have unprovoked PE 04/2022 noted to have elevated PTT, DRV VT, hexagonal phase phospholipid and positive anticardiolipin IgM and was on Eliquis . -Noted to have followed up with hematology/oncology 07/2022. -Noted that patient stopped taking Eliquis  08/2022. -Anticardiolipin IgM still elevated during this hospitalization. -Per neurology, hematology recommending okay to start anticoagulation once platelet count is greater than 50. -Patient started on IV heparin . -Pt still NG dependent. Hopefully can eventually wean off cortrak so pt can tolerate oral anticoagulation vs PEG   4.  Fever/UTI -Fever curve noted to have trended down. -Urinalysis done concerning for UTI. -Initial urine cultures with 50,000 colonies of E. coli. -Status post full course of IV Rocephin . -Repeat urine cultures with no growth to date. -Blood cultures negative to date. 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 -Outpatient follow-up when medically stable for positive RPR. -Afebrile today   5.  BP -BP noted to be soft but improving. -Not on antihypertensive medications prior to admission. -BP remains stable.  -Of note, pt was observed to be somewhat agitated at bedside   6.  Anemia/thrombocytopenia/B12 deficiency/iron  deficiency -Platelet count improving daily.   -Anemia panel concerning for iron  deficiency anemia.  Folate at 9.9.  Vitamin B12 of 122. -Status post IV iron . -Continue oral iron  supplementation and vitamin B12 supplementation. -Outpatient follow-up.   7.  Tobacco abuse -Patient noted to smoke cigars. -When more  alert will need discussion about total tobacco cessation.   8.  Substance abuse -It is noted that patient uses Suboxone. -UDS negative. -Once more alert will need to discuss substance abuse and sensation with patient.   9.  Dysphagia -Cortrak initially placed pulled out, NG tube placed, cortrak placed back in currently on tube feeds at 55. -SLP following. -Hopefully can wean off tube feed and transition to PO. If not, may have to consider PEG      Subjective: Unable to assess given deficits  Physical Exam: Vitals:   03/07/23 0400 03/07/23 0758 03/07/23 1224 03/07/23 1504  BP: 108/68 104/74 112/73 97/67  Pulse: 81 (!) 104 (!) 106 98  Resp: 19 18 18 16   Temp: 98.5 F (36.9 C) 98.4 F (36.9 C) 98.1 F (36.7 C) 98.1 F (36.7 C)  TempSrc: Axillary Axillary Axillary Axillary  SpO2: 99% 100% 100% 100%  Weight:      Height:       General exam: Awake, laying in bed, in nad Respiratory system: Normal respiratory effort, no wheezing Cardiovascular system: regular rate, s1, s2 Gastrointestinal system: Soft, nondistended, positive BS Central nervous system: CN2-12 grossly intact,no tremors Extremities: Perfused, no clubbing Skin: Normal skin turgor, no notable skin lesions seen Psychiatry: Unable to assess given deficits   Data Reviewed:  Labs reviewed: Na 137, k 4.0, Cr 0.84, WBC 7.0, Hgb 10.1, Plts 98  Family Communication: Pt in room, family not at bedside  Disposition: Status is: Inpatient Remains inpatient appropriate because: severity of illness  Planned Discharge Destination:  Unclear at this time    Author: Garnette Pelt, MD 03/07/2023 3:47 PM  For on call review [email or web address].

## 2023-03-07 NOTE — PMR Pre-admission (Signed)
PMR Admission Coordinator Pre-Admission Assessment  Patient: Jacqueline [name] is an 23 y.o., female MRN: [id number] DOB: [birth year] Height: 5\' 4"  (162.6 cm) Weight: 76.5 kg              Insurance Information HMO:     PPO:      PCP:      IPA:      80/20:      OTHER: [facility] PRIMARY: [place] [employer] [facility]      Policy#: [id number]      Subscriber: patient CM Name: Cacilie      Phone#: [phone]     Fax#: [phone] Pre-Cert#: [id number] approved for 6 days 1/21 until 1/26. Updates due 1/27      Employer: Part time Benefits:  Phone #: [phone]     Name: 1/17 Eff. Date: 09/02/22 until 04/04/23; I spoke with her Mom on 1/17 to inform her of end date 04/04/23 and she states she will go to DSS to make sure to update for renewal     Deduct: none      Out of Pocket Max: none      Life Max: none  CIR: per medicaid      SNF: per medicaid 90 days covered then moved to [facility]: 100 % per medicaid for 27 visits     Co-Pay:  Home Health: 100%      Co-Pay:  100 visits per year DME: 100%     Co-Pay: limited by medical neccesity Providers: in network  SECONDARY:       Policy#:       Phone#:   Artist:       Phone#:   The Data processing manager" for patients in Inpatient Rehabilitation Facilities with attached "Privacy Act Statement-Health Care Records" was provided and verbally reviewed with: family  Emergency Contact Information Contact Information     Name Relation Home Work Mobile   [place] Mother   [phone]   Edward,Dre Father   [phone]      Other Contacts     Name Relation Home Work Mobile   Rutledge,[name]    [phone]      Current Medical History  Patient Admitting Diagnosis: L MCA stroke  History of Present Illness: 23 year old right-handed female with medical history significant for Kell's isoimmunization pregnancy, unprovoked PE with positive lupus anticoagulant for which she stopped Eliquis in June 2024, hemoglobin C trait,  anemia iron deficient thrombocytopenia, tobacco use.  Presented 02/21/2023 with expressive aphasia and right side weakness.  Per chart patient admitted to drinking a significant amount of alcohol the night prior and did take Suboxone that has not been prescribed to her.    Chemistries unremarkable except hemoglobin 8.4, alcohol of 14, urine drug screen negative.  Cranial CT scan showed mild loss of gray-Morris differentiation left parietal temporal region representing left MCA territory acute infarct versus artifact.  No acute hemorrhage.  CTA showed occluded left proximal to mid M2 MCA.  MRI follow-up showed fairly extensive acute left MCA territory and watershed territory infarcts within the left cerebral hemisphere.  Small acute right frontal parietal cortical/subcortical infarcts also present.  Underwent left common carotid arteriogram per interventional radiology with attempted mechanical thrombectomy which unfortunately was unsuccessful Dr.Deveshwar.  Echocardiogram ejection fraction of 60 to 65% no wall motion abnormalities.  Hospital course bouts of agitation requiring restraints as well as Precedex.  EEG negative for seizure but did show diffuse encephalopathy.  A repeat MRI was completed for stroke follow-up 02/27/2023 showing increased acute  infarcts in left posterior frontal and parietal lobes, anterior left frontal lobe and right frontal lobe, and additional punctate acute infarcts in left occipital, left frontal lobe and medial parietal lobe.  No restricted diffusion in the left midbrain, cerebral peduncle, thalamus and posterior limb of internal capsule.  Mild increased mass effect from the larger areas of the left posterior frontal, left temporal and left parietal lobe with known more than 3 mm of left-to-right midline shift.  Neurology continues to follow patient is currently maintained on Eliquis.  Gastrostomy tube was placed 03/21/2023 for dysphagia per interventional radiology Dr. Loreta Ave.  Her  diet has since been advanced to a regular consistency.  Anemia iron deficient/thrombocytopenia.  She was last seen by hematology 2/24 for thrombocytopenia with no explanation for thrombocytopenia proposed latest platelet count 77,000 and continue to follow.  Hospital course patient did spike low-grade fever E. coli UTI placed on Rocephin x 5 days completed.   Complete NIHSS TOTAL: 22 Glasgow Coma Scale Score: 15  Patient's medical record from [facility] has been reviewed by the rehabilitation admission coordinator and physician.  Past Medical History  Past Medical History:  Diagnosis Date   Bacterial vaginitis 07/10/2016   Candida vaginitis 07/24/2016   Headache in pregnancy, antepartum, third trimester 07/24/2016   Kell isoimmunization during pregnancy    Has the patient had major surgery during 100 days prior to admission? yes  Family History  family history includes Diabetes in her maternal grandmother; Hypertension in her maternal grandmother.  Current Medications   Current Facility-Administered Medications:    acetaminophen (TYLENOL) tablet 650 mg, 650 mg, Oral, Q4H PRN, 650 mg at 03/24/23 1008 **OR** acetaminophen (TYLENOL) 160 MG/5ML solution 650 mg, 650 mg, Per Tube, Q4H PRN, 650 mg at 03/24/23 2330 **OR** acetaminophen (TYLENOL) suppository 650 mg, 650 mg, Rectal, Q4H PRN, Deveshwar, Sanjeev, MD, 650 mg at 02/28/23 1009   apixaban (ELIQUIS) tablet 5 mg, 5 mg, Oral, BID, Marolyn Haller, MD, 5 mg at 03/25/23 1012   clonazePAM (KLONOPIN) tablet 0.5 mg, 0.5 mg, Per Tube, QHS, Danford, Earl Lites, MD, 0.5 mg at 03/24/23 2054   cyanocobalamin (VITAMIN B12) tablet 2,000 mcg, 2,000 mcg, Per Tube, Daily, Agarwala, Ravi, MD, 2,000 mcg at 03/25/23 1011   feeding supplement (OSMOLITE 1.5 CAL) liquid 355 mL, 355 mL, Per Tube, QID, Ghimire, Lyndel Safe, MD, 355 mL at 03/25/23 0819   feeding supplement (PROSource TF20) liquid 60 mL, 60 mL, Per Tube, Daily, Ghimire, Kuber, MD, 60 mL at  03/25/23 1012   folic acid (FOLVITE) tablet 1 mg, 1 mg, Per Tube, Daily, Agarwala, Ravi, MD, 1 mg at 03/25/23 1011   free water 150 mL, 150 mL, Per Tube, QID, Dorcas Carrow, MD, 150 mL at 03/25/23 0820   gabapentin (NEURONTIN) capsule 100 mg, 100 mg, Oral, TID, Danford, Earl Lites, MD, 100 mg at 03/25/23 1011   iron polysaccharides (NIFEREX) capsule 150 mg, 150 mg, Per Tube, Daily, Agarwala, Ravi, MD, 150 mg at 03/25/23 1012   melatonin tablet 5 mg, 5 mg, Oral, QHS PRN, Dow Adolph N, DO, 5 mg at 03/24/23 0008   multivitamin liquid 15 mL, 15 mL, Per Tube, Daily, Marvel Plan, MD, 15 mL at 03/25/23 1012   neomycin-bacitracin-polymyxin [phone] OINT 1 Application, 1 Application, Topical, Daily, Gilmer Mor, DO, 1 Application at 03/25/23 1012   ondansetron (ZOFRAN) injection 4 mg, 4 mg, Intravenous, Q6H PRN, Milon Dikes, MD, 4 mg at 03/15/23 1032   oxyCODONE (ROXICODONE) 5 MG/5ML solution 5 mg, 5 mg, Oral,  Q6H PRN, Alberteen Sam, MD, 5 mg at 03/25/23 0738   polyethylene glycol (MIRALAX / GLYCOLAX) packet 17 g, 17 g, Per Tube, Daily, Marvel Plan, MD, 17 g at 03/25/23 1012   topiramate (TOPAMAX) tablet 25 mg, 25 mg, Oral, BID, Danford, Earl Lites, MD, 25 mg at 03/25/23 1011  Patients Current Diet:  Diet Order             Diet general           Diet regular Room service appropriate? No; Fluid consistency: Thin  Diet effective now                  Precautions / Restrictions Precautions Precautions: Fall Precaution Comments: peg Restrictions Weight Bearing Restrictions Per Provider Order: No   Has the patient had 2 or more falls or a fall with injury in the past year?No  Prior Activity Level Limited Community (1-2x/wk): went out 3-4 times a week  Prior Functional Level Prior Function Prior Level of Function : Independent/Modified Independent, Driving Mobility Comments: independent ADLs Comments: independent  Self Care: Did the patient need help bathing,  dressing, using the toilet or eating?  Independent  Indoor Mobility: Did the patient need assistance with walking from room to room (with or without device)? Independent  Stairs: Did the patient need assistance with internal or external stairs (with or without device)? Independent  Functional Cognition: Did the patient need help planning regular tasks such as shopping or remembering to take medications? Independent  Patient Information Are you of Hispanic, Latino/a,or Spanish origin?: X. Patient unable to respond (per mom) What is your race?: X. Patient unable to respond, B. Black or African American (per Mom) Do you need or want an interpreter to communicate with a doctor or health care staff?: 9. Unable to respond (not needed per Mom)  Patient's Response To:  Health Literacy and Transportation Is the patient able to respond to health literacy and transportation needs?: No Health Literacy - How often do you need to have someone help you when you read instructions, pamphlets, or other written material from your doctor or pharmacy?: Patient unable to respond In the past 12 months, has lack of transportation kept you from medical appointments or from getting medications?: No (per Mom) In the past 12 months, has lack of transportation kept you from meetings, work, or from getting things needed for daily living?:  (per Mom)  Home Assistive Devices / Equipment Home Equipment: Grab bars - tub/shower  Prior Device Use: Indicate devices/aids used by the patient prior to current illness, exacerbation or injury? None of the above  Current Functional Level Cognition  Arousal/Alertness: Awake/alert Overall Cognitive Status: Difficult to assess Difficult to assess due to: Impaired communication Current Attention Level: Sustained Orientation Level: Oriented to person, Oriented to place Following Commands: Follows one step commands with increased time Safety/Judgement: Decreased awareness of  safety, Decreased awareness of deficits General Comments: Vast improvement in attention, initiation, cognition, arousal, and command following. fairly consistent yes/no to most questions. visible frustration with not being able to verbally state needs Attention: Sustained Sustained Attention:  (sustained attention to PO trials well initially) Behaviors: Other (comment) (yelling, not agitated with SLP but has been agitated with other staff)    Extremity Assessment (includes Sensation/Coordination)  Upper Extremity Assessment: RUE deficits/detail RUE Deficits / Details: no AROM noted, RUE resting in protective internal rotation with flexion. unable to PROM digits or elbow, pt reporting pain and asking therapist to stop. Soft elbow splint not  donned, pt will benefit from resting hadn splint RUE Sensation: decreased light touch, decreased proprioception RUE Coordination: decreased fine motor, decreased gross motor LUE Deficits / Details: Using functionally and to command this date. Attempted hand writing task with non-dominant. LUE Coordination: decreased fine motor, decreased gross motor  Lower Extremity Assessment: Defer to PT evaluation RLE Deficits / Details: no active movement, withdrawal from pain, or clonus. ROM WFL    ADLs  Overall ADL's : Needs assistance/impaired Eating/Feeding: NPO Grooming: Wash/dry face, Total assistance Grooming Details (indicate cue type and reason): total A to wash face Upper Body Dressing : Maximal assistance Upper Body Dressing Details (indicate cue type and reason): sitting EOB, educated to don over RUE first Lower Body Dressing: Total assistance, Bed level Lower Body Dressing Details (indicate cue type and reason): to don socks from bed level Toilet Transfer: Moderate assistance, Tax adviser Details (indicate cue type and reason): simulated with +2 at EOB Functional mobility during ADLs: Moderate assistance General ADL Comments: contines  to be limited by R hemi deficits and communication although continues to make great strides towards her acute goals    Mobility  Overal bed mobility: Needs Assistance Bed Mobility: Rolling, Sidelying to Sit, Sit to Supine Rolling: Mod assist Sidelying to sit: Mod assist Supine to sit: Max assist Sit to supine: Mod assist General bed mobility comments: towards R, attempting to have pt hook LLE under R to self mobilize to EOB    Transfers  Overall transfer level: Needs assistance Equipment used: Rolling walker (2 wheels), 1 person hand held assist Transfers: Sit to/from Stand Sit to Stand: Mod assist, Max assist Bed to/from chair/wheelchair/BSC transfer type:: Stand pivot Stand pivot transfers: Max assist, +2 physical assistance  Lateral/Scoot Transfers: +2 physical assistance, Max assist General transfer comment: Pt following commands for safety and progressed to max A to stand from EOB with RW support modA to stand with HHA, cues for weight shif to L able to take x1 step laterally hands on assist at RLE    Ambulation / Gait / Stairs / Wheelchair Mobility  Ambulation/Gait General Gait Details: NT    Posture / Balance Dynamic Sitting Balance Sitting balance - Comments: intermittent R lateral lean, VC to correct Balance Overall balance assessment: Needs assistance Sitting-balance support: Feet supported, Single extremity supported Sitting balance-Leahy Scale: Fair Sitting balance - Comments: intermittent R lateral lean, VC to correct Postural control: Right lateral lean, Posterior lean Standing balance support: Bilateral upper extremity supported Standing balance-Leahy Scale: Poor Standing balance comment: mod-max to maintain standing and shift weight to L    Special needs/care consideration PEG placement 1/17    Previous Home Environment  Living Arrangements: Spouse/significant other (BD and 2 minor children) Available Help at Discharge: Family, Available 24 hours/day  (boyfriend, mom, or sister) Type of Home: Apartment Home Layout: One level Home Access: Stairs to enter Entrance Stairs-Rails: None (walls) Entrance Stairs-Number of Steps: 1 flight Bathroom Shower/Tub: Engineer, manufacturing systems: Standard Home Care Services: No  Children are 5 and 6 and her Mom is caring for them. Children's Dad is "[place]". [name] is a "friend"/boyfriend. Has 9 siblings. Mom plans for patient to come to her house, and family to care for her.  I spoke with Mom on 1/21 to make her aware that patient could have 1 visitor overnight, must be in the room by 8 pm and to stay in her room, not in the halls. Goals is for patient to sleep at night to be able to participate  with therapies during the day. Mom to discuss with family and friends.  Discharge Living Setting Plans for Discharge Living Setting: Apartment, Lives with (comment) (Plans to live with mom) Type of Home at Discharge: Apartment Discharge Home Layout: One level Discharge Home Access: Stairs to enter Entrance Stairs-Rails: Right Entrance Stairs-Number of Steps: 10 or more steps to 2nd level apartment Discharge Bathroom Shower/Tub: Tub/shower unit, Curtain Discharge Bathroom Toilet: Standard Discharge Bathroom Accessibility: Yes How Accessible: Accessible via walker Does the patient have any problems obtaining your medications?: No  Social/Family/Support Systems Patient Roles: Other (Comment), Parent (Has mom, SO, BF and 2 small children) Contact Information: Natally Hellenbrand - mom - [phone] Anticipated Caregiver: Mom, BF, sister and has 10 siblings Ability/Limitations of Caregiver: Mom can assist and has 10 children who can also help Caregiver Availability: 24/7 Discharge Plan Discussed with Primary Caregiver: Yes Is Caregiver In Agreement with Plan?: Yes Does Caregiver/Family have Issues with Lodging/Transportation while Pt is in Rehab?: No  Goals Patient/Family Goal for Rehab: PT/OT min to mod  Assist, SLP mod to max assist goals Expected length of stay: 3-4 weeks Additional Information: Mom to discuss home with BF and other family members; WHat is best for Sabrine Pt/Family Agrees to Admission and willing to participate: Yes Program Orientation Provided & Reviewed with Pt/Caregiver Including Roles  & Responsibilities: Yes Additional Information Needs: no  Decrease burden of Care through IP rehab admission: N/A  Possible need for SNF placement upon discharge:  Not planned  Patient Condition: This patient's medical and functional status has changed since the consult dated: 03/04/23 in which the Rehabilitation Physician determined and documented that the patient's condition is appropriate for intensive rehabilitative care in an inpatient rehabilitation facility. See "History of Present Illness" (above) for medical update. Functional changes are: max assist overall. Patient's medical and functional status update has been discussed with the Rehabilitation physician and patient remains appropriate for inpatient rehabilitation. Will admit to inpatient rehab today.  Preadmission Screen Completed By:  Clois Dupes, RN MSN, 03/25/2023 11:06 AM ______________________________________________________________________   Discussed status with Dr. Berline Chough on 03/25/23 at 1043 and received approval for admission today.  Admission Coordinator:  Clois Dupes, RN MSN time [id number] Date 03/25/23

## 2023-03-07 NOTE — Plan of Care (Signed)
 Fiance' was at bedside with her and in the bed with her to help calm her down, as she just cries and yells out when he is not with her.  She is tearful but very calm when he is ni the bed with her.  Hep drip infusing.  Frequent repositioning.    Problem: Nutrition: Goal: Risk of aspiration will decrease Outcome: Progressing   Problem: Nutrition: Goal: Dietary intake will improve Outcome: Progressing   Problem: Clinical Measurements: Goal: Will remain free from infection Outcome: Progressing   Problem: Clinical Measurements: Goal: Diagnostic test results will improve Outcome: Progressing   Problem: Coping: Goal: Level of anxiety will decrease Outcome: Progressing

## 2023-03-07 NOTE — Progress Notes (Signed)
 STROKE TEAM PROGRESS NOTE   BRIEF HPI [name] is a 23 y.o. 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  The night before presentation, she drank a significant amount of alcohol and took some Suboxone that was not prescribed to her.  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  She went to IR for mechanical thrombectomy, but this was unfortunately unsuccessful.  This morning, patient is noted to have continued expressive aphasia and right hemiparesis.  Patient had a significant episode of agitation this morning with combativeness requiring use of restraints and initiation of Precedex  drip.  Per patient's boyfriend, she does not drink every day and does not typically use illicit drugs.  NIH on Admission 3   SIGNIFICANT HOSPITAL EVENTS 12/20-patient admitted, mechanical thrombectomy attempted but unsuccessful 12/21-significant episode of agitation requiring restraints and Precedex  12/24-off precedex  12/25-continue intermittent fever and agitation, on Abx and resume precedex . Resumed heparin  IV 12/26-repeat MRI showed stroke extension.  12/30-still intermittent agitation, yelling, but distractable. On seroquel  and clonazepam .   INTERIM HISTORY/SUBJECTIVE Her boyfriend is at the bedside. Pt is awake today and not agitated.  CMP and CBC are stable.  Heparin  level is slightly high at 0.56 She continues to have tube feeds.  Patient  remains aphasic with dense right hemiplegia.  Speech therapy had recommended n.p.o. status..   Vital signs stable.  Neurological exam unchanged OBJECTIVE  CBC    Component Value Date/Time   WBC 7.0 03/07/2023 0742   RBC 4.08 03/07/2023 0742   HGB 10.1 (L) 03/07/2023 0742   HGB 10.7 (L) 05/21/2017 1026   HCT 31.0 (L) 03/07/2023 0742   HCT 32.4 (L) 05/21/2017 1026   PLT 98 (L) 03/07/2023 0742   PLT 118 (L) 05/21/2017 1026   MCV 76.0 (L)  03/07/2023 0742   MCV 82 05/21/2017 1026   MCH 24.8 (L) 03/07/2023 0742   MCHC 32.6 03/07/2023 0742   RDW 27.0 (H) 03/07/2023 0742   RDW 15.3 05/21/2017 1026   LYMPHSABS 1.6 02/26/2023 0516   LYMPHSABS 2.3 02/28/2017 1618   MONOABS 0.9 02/26/2023 0516   EOSABS 0.1 02/26/2023 0516   EOSABS 0.0 02/28/2017 1618   BASOSABS 0.0 02/26/2023 0516   BASOSABS 0.0 02/28/2017 1618    BMET    Component Value Date/Time   NA 137 03/07/2023 0742   K 4.0 03/07/2023 0742   CL 105 03/07/2023 0742   CO2 24 03/07/2023 0742   GLUCOSE 104 (H) 03/07/2023 0742   BUN 25 (H) 03/07/2023 0742   CREATININE 0.84 03/07/2023 0742   CALCIUM  9.2 03/07/2023 0742   GFRNONAA >60 03/07/2023 0742    IMAGING past 24 hours No results found.     Vitals:   03/07/23 0100 03/07/23 0400 03/07/23 0758 03/07/23 1224  BP: 106/68 108/68 (P) 104/74 (P) 112/73  Pulse: 90 81 (!) (P) 104 (!) (P) 106  Resp: 18 19 (P) 18 (P) 18  Temp: 98.8 F (37.1 C) 98.5 F (36.9 C) (P) 98.4 F (36.9 C) (P) 98.1 F (36.7 C)  TempSrc: Axillary Axillary (P) Axillary (P) Axillary  SpO2: 100% 99% (P) 100% (P) 100%  Weight:      Height:         PHYSICAL EXAM General: Drowsy, well-nourished, well-developed patient with intermittent agitation, yelling CV: Regular rhythm on monitor but tachycardia Respiratory:  Regular, unlabored respirations on room air  NEURO:  Intermittent agitation with yelling, still lethargic,  but slowly open eyes on voice. Nonverbal and not following commands. Eyes left gaze preference, barely cross midline. Blinking to visual threat on the left but not on the right. Right facial droop with right UE and LE flaccid. RUE and RLE spontaneous movement against gravity. Sensation, coordination and gait not tested.   ASSESSMENT/PLAN  Stroke:  left MCA territory infarct s/p unsuccessful IR with extension, etiology: likely related to hypercoagulability from positive lupus anticoagulant not on [facility] Code Stroke CT head  left parietotemporal \\infarct  ASPECTS 7-8 CTA head & neck occluded left proximal to mid M2 MCA CT perfusion 21 mm penumbra in left parietotemporal region S/p IR - initially TICI3, but progressive worsening stenosis with subsequent complete closure of left M2. Intermittent mild improvement in cailber with verapamil  and nitroglycerine.  MRI acute left MCA territory infarct without hemorrhagic conversion CT repeat likely stroke extension in the anterior inferior right frontal lobe and right cerebellum MRI repeat showed stroke extension but no hemorrhagic conversion. MLS mild < 3mm.  CT repeat 12/28 showed stable large right MCA and small left MCA/ACA infarcts. MLS 2-3mm CT repeat 12/30 expected evolutionary changes in the left posterior division left MCA and left ACA/MCA border zone infarcts.  2D Echo EF 60 to 65%.  Left atrium size normal. EEG severe encephalopathy, no seizure LDL 42 HgbA1c 4.4 UDS neg VTE prophylaxis -SCDs No antithrombotic prior to admission, now on heparin  IV.  Therapy recommendations:  CIR Disposition: Pending  Agitation, episodic Patient had intermittent episodes of agitation, but distractable with conversation and working with PT and OT, concerning for frustration Was on precedex  -> off -> back on precedex  -> off again Per patient's significant other, she does not frequently drink or use illicit substances, but concern for withdrawal still remains given alcohol and Suboxone use prior to admission Depakote  level 42 On depakote  500mg  Q8h -> 1000 bid-changed to 1000 every 8 hourly On clonazepam  0.5 bid -> d/c->1mg  bid Seroquel  to 50->25->50 bid  Positive lupus anticoagulant  Hx of 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, DRVVT, 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 - put on eliquis   Followed up with oncology/hematology in 07/2022 Per report, she stopped taking eliquis  in 08/2022 Current admission repeat anti-cardiolipin IgM  still elevated  Per hematology, can start anticoagulation when platelet count is greater than 50  now on heparin  IV  Anemia  Thrombocytopenia B12 deficiency  Iron  deficiency  Hb 8.4->7.6->7.9->7.4->7.9->8.1->8.5->8.1 B12 = 122, folate WNL Platelet 54->44->48->59->48->57->54->65->77 On iron  infusion and B12 supplement CBC monitoring  Fever UTI Tmax 102.2->102.3->101.1->100.7->100.1->afebrile Tachycardia with low BP, need to monitor for sepsis/septic shock UA WBC 21-50, Urine Cx E. Coli, finished rocephin  course UA and U Cx repeat no growth  blood Cx neg RPR again positive 1:1, but T. Pallidum Ab negative CCM on board  BP management Home meds: None BP stable now Close monitoring  Lipid management Home meds: None LDL 42, goal < 70 High intensity statin not indicated as LDL below goal  Tobacco Abuse Patient smokes cigars Will discuss tobacco cessation when patient is alert and oriented  Substance Abuse Patient uses Suboxone UDS negative Will discuss substance abuse and cessation when patient is alert and oriented  Dysphagia Patient has post-stroke dysphagia SLP consulted Currently NPO Cortrak-> pulled off -> NG tube -> cortrak On TF @ 55  Other Stroke Risk Factors ETOH use, alcohol level 14, advised patient to drink no more than 1 drink a day  Other Active Problems Leukocytosis WBC 15.9->11.5->8.1->7.2->6.3->6.7->6.1->8.3->5.7  Hospital day # 14  Patient remains aphasic with right hemiplegia but now has shown some improvement in her agitation on Klonopin , Seroquel  and Depakene .    Continue IV heparin  till patient is able to swallow then switch to Eliquis  continue n.p.o. status and tube feeds as per speech therapy.  Patient may need PEG tube next week if swallow function does not improve.  Mobilize out of bed.  Await transfer to rehab when bed available.  Long discussion with patient and boyfriend at the bedside and answered questions. Discussed with Dr. Patria.   Stroke team will sign off.  Kindly call for questions. Eather Popp, MD   To contact Stroke Continuity provider, please refer to [email or web address]. After hours, contact General Neurology

## 2023-03-08 DIAGNOSIS — D649 Anemia, unspecified: Secondary | ICD-10-CM | POA: Diagnosis not present

## 2023-03-08 DIAGNOSIS — R2990 Unspecified symptoms and signs involving the nervous system: Secondary | ICD-10-CM | POA: Diagnosis not present

## 2023-03-08 DIAGNOSIS — I63512 Cerebral infarction due to unspecified occlusion or stenosis of left middle cerebral artery: Secondary | ICD-10-CM | POA: Diagnosis not present

## 2023-03-08 LAB — COMPREHENSIVE METABOLIC PANEL
ALT: 44 U/L (ref 0–44)
AST: 45 U/L — ABNORMAL HIGH (ref 15–41)
Albumin: 2.9 g/dL — ABNORMAL LOW (ref 3.5–5.0)
Alkaline Phosphatase: 79 U/L (ref 38–126)
Anion gap: 12 (ref 5–15)
BUN: 25 mg/dL — ABNORMAL HIGH (ref 6–20)
CO2: 19 mmol/L — ABNORMAL LOW (ref 22–32)
Calcium: 9.1 mg/dL (ref 8.9–10.3)
Chloride: 103 mmol/L (ref 98–111)
Creatinine, Ser: 0.74 mg/dL (ref 0.44–1.00)
GFR, Estimated: >60 mL/min (ref >60)
Glucose, Bld: 120 mg/dL — ABNORMAL HIGH (ref 70–99)
Potassium: 3.8 mmol/L (ref 3.5–5.1)
Sodium: 134 mmol/L — ABNORMAL LOW (ref 135–145)
Total Bilirubin: 0.4 mg/dL (ref 0.0–1.2)
Total Protein: 7.5 g/dL (ref 6.5–8.1)

## 2023-03-08 LAB — GLUCOSE, CAPILLARY
Glucose-Capillary: 101 mg/dL — ABNORMAL HIGH (ref 70–99)
Glucose-Capillary: 101 mg/dL — ABNORMAL HIGH (ref 70–99)
Glucose-Capillary: 105 mg/dL — ABNORMAL HIGH (ref 70–99)
Glucose-Capillary: 113 mg/dL — ABNORMAL HIGH (ref 70–99)
Glucose-Capillary: 124 mg/dL — ABNORMAL HIGH (ref 70–99)
Glucose-Capillary: 95 mg/dL (ref 70–99)

## 2023-03-08 LAB — CBC
HCT: 29.1 % — ABNORMAL LOW (ref 36.0–46.0)
Hemoglobin: 9.5 g/dL — ABNORMAL LOW (ref 12.0–15.0)
MCH: 24.8 pg — ABNORMAL LOW (ref 26.0–34.0)
MCHC: 32.6 g/dL (ref 30.0–36.0)
MCV: 76 fL — ABNORMAL LOW (ref 80.0–100.0)
Platelets: 88 10*3/uL — ABNORMAL LOW (ref 150–400)
RBC: 3.83 MIL/uL — ABNORMAL LOW (ref 3.87–5.11)
RDW: 26.8 % — ABNORMAL HIGH (ref 11.5–15.5)
WBC: 6.7 10*3/uL (ref 4.0–10.5)
nRBC: 0 % (ref 0.0–0.2)

## 2023-03-08 LAB — HEPARIN LEVEL (UNFRACTIONATED): Heparin Unfractionated: 0.36 [IU]/mL (ref 0.30–0.70)

## 2023-03-08 NOTE — Progress Notes (Signed)
 Was given Lorazepam  prn dose because she was wailing, and anxious, and inconsolable.  Boyfriend at bedside to try to help calm her down.  She was repositioned, checked for incontinence, and oral care given and ROM provided.  Had already received prn pain med.

## 2023-03-08 NOTE — Plan of Care (Signed)
  Problem: Nutrition: Goal: Risk of aspiration will decrease Outcome: Progressing   Problem: Clinical Measurements: Goal: Will remain free from infection Outcome: Progressing   Problem: Activity: Goal: Risk for activity intolerance will decrease Outcome: Progressing   Problem: Coping: Goal: Level of anxiety will decrease Outcome: Progressing slowly   Problem: Skin Integrity: Goal: Risk for impaired skin integrity will decrease Outcome: Progressing

## 2023-03-08 NOTE — Progress Notes (Signed)
 PHARMACY - ANTICOAGULATION CONSULT NOTE  Pharmacy Consult:  Heparin  Indication: Lupus AC and endoluminal thrombus   No Known Allergies  Patient Measurements: Height: 5' 4 (162.6 cm) Weight: 77.4 kg (170 lb 10.2 oz) IBW/kg (Calculated) : 54.7 Heparin  Dosing Weight: 72 kg  Vital Signs: Temp: 98.3 F (36.8 C) (01/04 0725) Temp Source: Oral (01/04 0725) BP: 99/66 (01/04 0725) Pulse Rate: 88 (01/04 0725)  Labs: Recent Labs    03/06/23 0510 03/07/23 0742 03/07/23 1607 03/08/23 0634  HGB 10.3* 10.1*  --  9.5*  HCT 31.7* 31.0*  --  29.1*  PLT 99* 98*  --  88*  HEPARINUNFRC 0.45 0.56 0.36 0.36  CREATININE 0.91 0.84  --  0.74    Estimated Creatinine Clearance: 109.2 mL/min (by C-G formula based on SCr of 0.74 mg/dL).   Medical History: Past Medical History:  Diagnosis Date   Bacterial vaginitis 07/10/2016   Candida vaginitis 07/24/2016   Headache in pregnancy, antepartum, third trimester 07/24/2016   Kell isoimmunization during pregnancy     Assessment: 22 YOF presented with acute CVA and MRI also shows endoluminal thrombus.  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 Hx of thrombocytopenia. Pharmacy consulted to dose heparin .  Heparin  level remains therapeutic at 0.36. Plt remains >50k. Hgb 9-10s. No issues with heparin  infusion or signs of bleeding noted.   Goal of Therapy:  Heparin  level 0.3-0.5 units/ml Monitor platelets by anticoagulation protocol: Yes   Plan:  Continue heparin  gtt at 1150 units/hr Daily heparin  level, CBC, s/s bleeding F/U ability to tolerate po  Marget Hench, PharmD PGY1 Pharmacy Resident

## 2023-03-08 NOTE — Plan of Care (Signed)
  Problem: Ischemic Stroke/TIA Tissue Perfusion: Goal: Complications of ischemic stroke/TIA will be minimized Outcome: Progressing   Problem: Coping: Goal: Will verbalize positive feelings about self Outcome: Progressing Goal: Will identify appropriate support needs Outcome: Progressing   Problem: Health Behavior/Discharge Planning: Goal: Ability to manage health-related needs will improve Outcome: Progressing Goal: Goals will be collaboratively established with patient/family Outcome: Progressing

## 2023-03-08 NOTE — Progress Notes (Signed)
 Progress Note   Patient: Jacqueline [name] FMW:[id number] DOB: 2000-05-11 DOA: 02/21/2023     15 DOS: the patient was seen and examined on 03/08/2023   Brief hospital course: 23 y.o. 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  The night before presentation, she drank a significant amount of alcohol and took some Suboxone that was not prescribed to her.  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  She went to IR for mechanical thrombectomy, but this was unfortunately unsuccessful.  This morning, patient is noted to have continued expressive aphasia and right hemiparesis.  Patient had a significant episode of agitation early on in the hospitalization, with combativeness requiring use of restraints and initiation of Precedex  drip.  Per patient's boyfriend, she does not drink every day and does not typically use illicit drugs.  Patient subsequently weaned off Precedex  drip and transferred out to ICU.   NIH on Admission 3     SIGNIFICANT HOSPITAL EVENTS 12/20-patient admitted, mechanical thrombectomy attempted but unsuccessful 12/21-significant episode of agitation requiring restraints and Precedex  12/24-off precedex  12/25-continue intermittent fever and agitation, on Abx and resume precedex . Resumed heparin  IV 12/26-repeat MRI showed stroke extension.  12/30-still intermittent agitation, yelling, but distractable. On seroquel  and clonazepam .     Assessment and Plan: #1 stroke: Left MCA territory infarct status post unsuccessful IR with extension, etiology: Likely related to hypercoagulability from positive lupus anticoagulant not on anticoagulation -Patient noted on presentation with concerns for code stroke head CT done with left parietotemporal infarct noted. -Patient underwent CT angiogram head and neck that showed an occluded left proximal to mid M2 MCA. -CT perfusion 21 mm penumbra  in the left parietotemporal region. -Patient seen in consultation by neurology and interventional neurology patient status post IR initially T1C13, but progressive worsening stenosis with subsequent complete closure of left M2.  Intermittent mild improvement in caliber with verapamil  and nitroglycerin . -MRI done with an acute left MCA territory infarct without hemorrhagic conversion. -Repeat head CT done showed extension of stroke in the anterior inferior right frontal lobe and right cerebellum. -Patient underwent repeat MRI which showed stroke extension but no hemorrhagic conversion. -Repeat CT head 12/28 showed stable large right MCA and small left MCA/ACA infarcts. -Repeat CT 12/30 showed expected evolutionary changes in the left posterior division left MCA and left ACA/MCA border zone infarcts. -2D echo with normal EF. -EEG done with encephalopathy but no epileptiform activity noted. -Fasting lipid panel with LDL of 42.  Statin currently not indicated.  - Hemoglobin A1c noted at 4.4. -Patient noted not to be on antithrombotic medications prior to admission and started on IV heparin  per neurology. -Neurology recommending continuation of IV heparin  until patient is able to swallow then switching to Eliquis . -Patient currently with cortrack and receiving tube feeds, PT OT following and recommending CIR. Dispo pending   2.  Agitation -Patient noted to have some intermittent episodes of agitation however per neurology noted to be distractible with conversation and working with PT and OT. -Patient noted to have required Precedex  drip which was subsequently weaned off initially placed back on Precedex  and currently off Precedex . -Per significant other patient does not frequently drink or use illicit substances but still concern for withdrawal given alcohol and Suboxone use prior to admission. -Depakote  level noted at 42. -Was on Depakote  500 every 8 hours subsequently changed to 1000 mg twice daily  and changed to 750 every 8 hours. -Noted to  be on clonazepam  0.5 mg twice daily and increased to 1 mg twice daily. -Continue Seroquel  50 mg twice daily. -Patient still with bouts of agitation, neurology recommending increase Depakote  to 1000 mg 3 times daily. -Decreased IV Ativan  0.5 mg every 8 hours as needed. -cont with supportive care   3.  Positive lupus anticoagulant/history of PE -Patient noted to have unprovoked PE 04/2022 noted to have elevated PTT, DRV VT, hexagonal phase phospholipid and positive anticardiolipin IgM and was on Eliquis . -Noted to have followed up with hematology/oncology 07/2022. -Noted that patient stopped taking Eliquis  08/2022. -Anticardiolipin IgM still elevated during this hospitalization. -Per neurology, hematology recommending okay to start anticoagulation once platelet count is greater than 50. -Patient started on IV heparin . -Pt still NG dependent. Hopefully can eventually wean off cortrak so pt can tolerate oral anticoagulation vs PEG   4.  Fever/UTI -Fever curve noted to have trended down. -Urinalysis done concerning for UTI. -Initial urine cultures with 50,000 colonies of E. coli. -Status post full course of IV Rocephin . -Repeat urine cultures with no growth to date. -Blood cultures negative to date. 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 -Outpatient follow-up when medically stable for positive RPR. -Afebrile today   5.  BP -BP noted to be soft but improving. -Not on antihypertensive medications prior to admission. -BP remains stable.  -Of note, pt was observed to be somewhat agitated at bedside   6.  Anemia/thrombocytopenia/B12 deficiency/iron  deficiency -Platelet count improving daily.   -Anemia panel concerning for iron  deficiency anemia.  Folate at 9.9.  Vitamin B12 of 122. -Status post IV iron . -Continue oral iron  supplementation and vitamin B12 supplementation. -Outpatient follow-up.   7.  Tobacco abuse -Patient noted to smoke  cigars. -When more alert will need discussion about total tobacco cessation.   8.  Substance abuse -It is noted that patient uses Suboxone. -UDS negative. -Once more alert will need to discuss substance abuse and sensation with patient.   9.  Dysphagia -Cortrak initially placed pulled out, NG tube placed, cortrak placed back in currently on tube feeds at 55. -SLP following. -Hopefully can wean off tube feed and transition to PO. If not, may have to consider PEG      Subjective: Difficult to assess given deficits  Physical Exam: Vitals:   03/08/23 0401 03/08/23 0500 03/08/23 0725 03/08/23 1120  BP: 106/64  99/66 102/68  Pulse: 90  88 99  Resp: 18  18 18   Temp: 98.2 F (36.8 C)  98.3 F (36.8 C) 98.9 F (37.2 C)  TempSrc: Oral  Oral Oral  SpO2: 96%  99% 100%  Weight:  77.4 kg    Height:       General exam: Conversant, in no acute distress Respiratory system: normal chest rise, clear, no audible wheezing Cardiovascular system: regular rhythm, s1-s2 Gastrointestinal system: Nondistended, nontender, pos BS Central nervous system: No seizures, no tremors Extremities: No cyanosis, no joint deformities Skin: No rashes, no pallor Psychiatry: Affect normal // no auditory hallucinations   Data Reviewed:  Labs reviewed: Na 134, K 3.8, Cr 0.74, WBC 6.7, Hgb 9.5, Plts 88  Family Communication: Pt in room, family at bedside  Disposition: Status is: Inpatient Remains inpatient appropriate because: severity of illness  Planned Discharge Destination:  Unclear at this time    Author: Garnette Pelt, MD 03/08/2023 3:05 PM  For on call review [email or web address].

## 2023-03-09 DIAGNOSIS — I63512 Cerebral infarction due to unspecified occlusion or stenosis of left middle cerebral artery: Secondary | ICD-10-CM | POA: Diagnosis not present

## 2023-03-09 DIAGNOSIS — D649 Anemia, unspecified: Secondary | ICD-10-CM | POA: Diagnosis not present

## 2023-03-09 DIAGNOSIS — R2990 Unspecified symptoms and signs involving the nervous system: Secondary | ICD-10-CM | POA: Diagnosis not present

## 2023-03-09 LAB — CBC
HCT: 29.7 % — ABNORMAL LOW (ref 36.0–46.0)
Hemoglobin: 9.8 g/dL — ABNORMAL LOW (ref 12.0–15.0)
MCH: 25.3 pg — ABNORMAL LOW (ref 26.0–34.0)
MCHC: 33 g/dL (ref 30.0–36.0)
MCV: 76.5 fL — ABNORMAL LOW (ref 80.0–100.0)
Platelets: 84 10*3/uL — ABNORMAL LOW (ref 150–400)
RBC: 3.88 MIL/uL (ref 3.87–5.11)
RDW: 26.6 % — ABNORMAL HIGH (ref 11.5–15.5)
WBC: 5.6 10*3/uL (ref 4.0–10.5)
nRBC: 0 % (ref 0.0–0.2)

## 2023-03-09 LAB — GLUCOSE, CAPILLARY
Glucose-Capillary: 101 mg/dL — ABNORMAL HIGH (ref 70–99)
Glucose-Capillary: 105 mg/dL — ABNORMAL HIGH (ref 70–99)
Glucose-Capillary: 107 mg/dL — ABNORMAL HIGH (ref 70–99)
Glucose-Capillary: 110 mg/dL — ABNORMAL HIGH (ref 70–99)
Glucose-Capillary: 86 mg/dL (ref 70–99)
Glucose-Capillary: 97 mg/dL (ref 70–99)

## 2023-03-09 LAB — HEPARIN LEVEL (UNFRACTIONATED): Heparin Unfractionated: 0.33 [IU]/mL (ref 0.30–0.70)

## 2023-03-09 NOTE — Progress Notes (Signed)
 Progress Note   Patient: Jacqueline [name] FMW:[id number] DOB: 21-Nov-2000 DOA: 02/21/2023     16 DOS: the patient was seen and examined on 03/09/2023   Brief hospital course: 23 y.o. 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  The night before presentation, she drank a significant amount of alcohol and took some Suboxone that was not prescribed to her.  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  She went to IR for mechanical thrombectomy, but this was unfortunately unsuccessful.  This morning, patient is noted to have continued expressive aphasia and right hemiparesis.  Patient had a significant episode of agitation early on in the hospitalization, with combativeness requiring use of restraints and initiation of Precedex  drip.  Per patient's boyfriend, she does not drink every day and does not typically use illicit drugs.  Patient subsequently weaned off Precedex  drip and transferred out to ICU.   NIH on Admission 3     SIGNIFICANT HOSPITAL EVENTS 12/20-patient admitted, mechanical thrombectomy attempted but unsuccessful 12/21-significant episode of agitation requiring restraints and Precedex  12/24-off precedex  12/25-continue intermittent fever and agitation, on Abx and resume precedex . Resumed heparin  IV 12/26-repeat MRI showed stroke extension.  12/30-still intermittent agitation, yelling, but distractable. On seroquel  and clonazepam .     Assessment and Plan: #1 stroke: Left MCA territory infarct status post unsuccessful IR with extension, etiology: Likely related to hypercoagulability from positive lupus anticoagulant not on anticoagulation -Patient noted on presentation with concerns for code stroke head CT done with left parietotemporal infarct noted. -Patient underwent CT angiogram head and neck that showed an occluded left proximal to mid M2 MCA. -CT perfusion 21 mm penumbra  in the left parietotemporal region. -Patient seen in consultation by neurology and interventional neurology patient status post IR initially T1C13, but progressive worsening stenosis with subsequent complete closure of left M2.  Intermittent mild improvement in caliber with verapamil  and nitroglycerin . -MRI done with an acute left MCA territory infarct without hemorrhagic conversion. -Repeat head CT done showed extension of stroke in the anterior inferior right frontal lobe and right cerebellum. -Patient underwent repeat MRI which showed stroke extension but no hemorrhagic conversion. -Repeat CT head 12/28 showed stable large right MCA and small left MCA/ACA infarcts. -Repeat CT 12/30 showed expected evolutionary changes in the left posterior division left MCA and left ACA/MCA border zone infarcts. -2D echo with normal EF. -EEG done with encephalopathy but no epileptiform activity noted. -Fasting lipid panel with LDL of 42.  Statin currently not indicated.  - Hemoglobin A1c noted at 4.4. -Patient noted not to be on antithrombotic medications prior to admission and started on IV heparin  per neurology. -Neurology recommending continuation of IV heparin  until patient is able to swallow then switching to Eliquis . -Patient currently with cortrack and receiving tube feeds, PT OT following and recommending CIR. Disposition plans remain pending   2.  Agitation -Patient noted to have some intermittent episodes of agitation however per neurology noted to be distractible with conversation and working with PT and OT. -Patient noted to have required Precedex  drip which was subsequently weaned off initially placed back on Precedex  and currently off Precedex . -Per significant other patient does not frequently drink or use illicit substances but still concern for withdrawal given alcohol and Suboxone use prior to admission. -on clonazepam  1 mg twice daily. -Continue Seroquel  50 mg twice daily. -On Depakote  to  1000 mg 3 times daily per Neuro recsc -cont on IV  Ativan  0.5 mg every 8 hours as needed. -cont with supportive care   3.  Positive lupus anticoagulant/history of PE -Patient noted to have unprovoked PE 04/2022 noted to have elevated PTT, DRV VT, hexagonal phase phospholipid and positive anticardiolipin IgM and was on Eliquis . -Noted to have followed up with hematology/oncology 07/2022. -Noted that patient stopped taking Eliquis  08/2022. -Anticardiolipin IgM still elevated during this hospitalization. -Per neurology, hematology recommending okay to start anticoagulation once platelet count is greater than 50. -Patient started on IV heparin . -Pt still NG dependent. Hopefully can eventually wean off cortrak so pt can tolerate oral anticoagulation vs PEG   4.  Fever/UTI -Fever curve noted to have trended down. -Urinalysis done concerning for UTI. -Initial urine cultures with 50,000 colonies of E. coli. -Status post full course of IV Rocephin . -Repeat urine cultures with no growth to date. -Blood cultures negative to date. 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 -Outpatient follow-up when medically stable for positive RPR. -Afebrile today   5.  BP -BP noted to be soft but improving. -Not on antihypertensive medications prior to admission. -BP remains stable.    6.  Anemia/thrombocytopenia/B12 deficiency/iron  deficiency -Platelet count improving daily.   -Anemia panel concerning for iron  deficiency anemia.  Folate at 9.9.  Vitamin B12 of 122. -Status post IV iron . -Continue oral iron  supplementation and vitamin B12 supplementation. -Outpatient follow-up.   7.  Tobacco abuse -Patient noted to smoke cigars. -When more alert will need discussion about total tobacco cessation.   8.  Substance abuse -It is noted that patient uses Suboxone. -UDS negative. -Once more alert will need to discuss substance abuse and sensation with patient.   9.  Dysphagia -Cortrak initially placed  pulled out, NG tube placed, cortrak placed back in currently on tube feeds at 55. -SLP following. -Hopefully can wean off tube feed and transition to PO. If not, may have to consider PEG      Subjective: Unable to assess given deficits  Physical Exam: Vitals:   03/09/23 0436 03/09/23 0822 03/09/23 1127 03/09/23 1512  BP: 97/62 97/67 99/64  94/67  Pulse: 95 89 98 97  Resp: 17 15 17 16   Temp: 97.8 F (36.6 C) 98.5 F (36.9 C) 98.6 F (37 C) 97.9 F (36.6 C)  TempSrc: Axillary Axillary Axillary Axillary  SpO2: 96% 98% 100% 100%  Weight:      Height:       General exam: Awake, laying in bed, in nad Respiratory system: Normal respiratory effort, no wheezing Cardiovascular system: regular rate, s1, s2 Gastrointestinal system: Soft, nondistended, positive BS Central nervous system: CN2-12 grossly intact, strength intact Extremities: Perfused, no clubbing Skin: Normal skin turgor, no notable skin lesions seen Psychiatry: Mood normal // no visual hallucinations   Data Reviewed:  There are no new results to review at this time.  Family Communication: Pt in room, family at bedside  Disposition: Status is: Inpatient Remains inpatient appropriate because: severity of illness  Planned Discharge Destination:  Unclear at this time    Author: Garnette Pelt, MD 03/09/2023 3:56 PM  For on call review [email or web address].

## 2023-03-09 NOTE — Plan of Care (Signed)
  Problem: Education: Goal: Knowledge of patient specific risk factors will improve Jacqueline [name] N/A or DELETE if not current risk factor) Outcome: Progressing   Problem: Ischemic Stroke/TIA Tissue Perfusion: Goal: Complications of ischemic stroke/TIA will be minimized Outcome: Progressing   Problem: Coping: Goal: Will verbalize positive feelings about self Outcome: Progressing   Problem: Health Behavior/Discharge Planning: Goal: Goals will be collaboratively established with patient/family Outcome: Progressing

## 2023-03-09 NOTE — Plan of Care (Signed)
 Moaning and screaming, consoled by boyfriend at bedside. Had given PRN meds as scheduled.   Problem: Ischemic Stroke/TIA Tissue Perfusion: Goal: Complications of ischemic stroke/TIA will be minimized Outcome: Progressing   Problem: Self-Care: Goal: Ability to communicate needs accurately will improve Outcome: Progressing   Problem: Nutrition: Goal: Risk of aspiration will decrease Outcome: Progressing   Problem: Activity: Goal: Risk for activity intolerance will decrease Outcome: Progressing   Problem: Safety: Goal: Ability to remain free from injury will improve Outcome: Progressing   Problem: Safety: Goal: Ability to remain free from injury will improve Outcome: Progressing

## 2023-03-09 NOTE — Progress Notes (Signed)
 PHARMACY - ANTICOAGULATION CONSULT NOTE  Pharmacy Consult:  Heparin  Indication: Lupus AC and endoluminal thrombus   No Known Allergies  Patient Measurements: Height: 5' 4 (162.6 cm) Weight: 77.4 kg (170 lb 10.2 oz) IBW/kg (Calculated) : 54.7 Heparin  Dosing Weight: 72 kg  Vital Signs: Temp: 97.8 F (36.6 C) (01/05 0436) Temp Source: Axillary (01/05 0436) BP: 97/62 (01/05 0436) Pulse Rate: 95 (01/05 0436)  Labs: Recent Labs    03/07/23 0742 03/07/23 1607 03/08/23 0634 03/09/23 0626  HGB 10.1*  --  9.5* 9.8*  HCT 31.0*  --  29.1* 29.7*  PLT 98*  --  88* 84*  HEPARINUNFRC 0.56 0.36 0.36 0.33  CREATININE 0.84  --  0.74  --     Estimated Creatinine Clearance: 109.2 mL/min (by C-G formula based on SCr of 0.74 mg/dL).   Medical History: Past Medical History:  Diagnosis Date   Bacterial vaginitis 07/10/2016   Candida vaginitis 07/24/2016   Headache in pregnancy, antepartum, third trimester 07/24/2016   Kell isoimmunization during pregnancy     Assessment: 22 YOF presented with acute CVA and MRI also shows endoluminal thrombus.  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 Hx of thrombocytopenia. Pharmacy consulted to dose heparin .  Heparin  level remains therapeutic at 0.33. Plt remains >50k. Hgb 9-10s. No issues with heparin  infusion or signs of bleeding noted per RN   Goal of Therapy:  Heparin  level 0.3-0.5 units/ml Monitor platelets by anticoagulation protocol: Yes   Plan:  Continue heparin  gtt at 1150 units/hr Daily heparin  level, CBC, s/s bleeding F/U ability to tolerate po  Marget Hench, PharmD PGY1 Pharmacy Resident

## 2023-03-10 DIAGNOSIS — N3000 Acute cystitis without hematuria: Secondary | ICD-10-CM | POA: Diagnosis not present

## 2023-03-10 DIAGNOSIS — I63512 Cerebral infarction due to unspecified occlusion or stenosis of left middle cerebral artery: Secondary | ICD-10-CM | POA: Diagnosis not present

## 2023-03-10 DIAGNOSIS — D649 Anemia, unspecified: Secondary | ICD-10-CM | POA: Diagnosis not present

## 2023-03-10 DIAGNOSIS — R451 Restlessness and agitation: Secondary | ICD-10-CM | POA: Diagnosis not present

## 2023-03-10 LAB — CBC
HCT: 29.2 % — ABNORMAL LOW (ref 36.0–46.0)
Hemoglobin: 9.6 g/dL — ABNORMAL LOW (ref 12.0–15.0)
MCH: 25.5 pg — ABNORMAL LOW (ref 26.0–34.0)
MCHC: 32.9 g/dL (ref 30.0–36.0)
MCV: 77.5 fL — ABNORMAL LOW (ref 80.0–100.0)
Platelets: 82 10*3/uL — ABNORMAL LOW (ref 150–400)
RBC: 3.77 MIL/uL — ABNORMAL LOW (ref 3.87–5.11)
RDW: 26.3 % — ABNORMAL HIGH (ref 11.5–15.5)
WBC: 6 10*3/uL (ref 4.0–10.5)
nRBC: 0 % (ref 0.0–0.2)

## 2023-03-10 LAB — GLUCOSE, CAPILLARY
Glucose-Capillary: 112 mg/dL — ABNORMAL HIGH (ref 70–99)
Glucose-Capillary: 97 mg/dL (ref 70–99)
Glucose-Capillary: 111 mg/dL — ABNORMAL HIGH (ref 70–99)
Glucose-Capillary: 115 mg/dL — ABNORMAL HIGH (ref 70–99)
Glucose-Capillary: 135 mg/dL — ABNORMAL HIGH (ref 70–99)
Glucose-Capillary: 88 mg/dL (ref 70–99)
Glucose-Capillary: 76 mg/dL (ref 70–99)

## 2023-03-10 LAB — COMPREHENSIVE METABOLIC PANEL
ALT: 31 U/L (ref 0–44)
AST: 34 U/L (ref 15–41)
Albumin: 2.8 g/dL — ABNORMAL LOW (ref 3.5–5.0)
Alkaline Phosphatase: 76 U/L (ref 38–126)
Anion gap: 11 (ref 5–15)
BUN: 27 mg/dL — ABNORMAL HIGH (ref 6–20)
CO2: 24 mmol/L (ref 22–32)
Calcium: 9 mg/dL (ref 8.9–10.3)
Chloride: 101 mmol/L (ref 98–111)
Creatinine, Ser: 0.73 mg/dL (ref 0.44–1.00)
GFR, Estimated: >60 mL/min (ref >60)
Glucose, Bld: 96 mg/dL (ref 70–99)
Potassium: 3.9 mmol/L (ref 3.5–5.1)
Sodium: 136 mmol/L (ref 135–145)
Total Bilirubin: 0.4 mg/dL (ref 0.0–1.2)
Total Protein: 7.3 g/dL (ref 6.5–8.1)

## 2023-03-10 LAB — HEPARIN LEVEL (UNFRACTIONATED): Heparin Unfractionated: 0.35 [IU]/mL (ref 0.30–0.70)

## 2023-03-10 NOTE — Progress Notes (Signed)
 Speech Language Pathology Treatment: Dysphagia;Cognitive-Linquistic  Patient Details Name: Jacqueline [name] MRN: [id number] DOB: [birth year] Today's Date: 03/10/2023 Time: 9059-8966 SLP Time Calculation (min) (ACUTE ONLY): 53 min  Assessment / Plan / Recommendation Clinical Impression  Pt awakened from sleep by SLP and partner. Pt smiling, SLP and BF provided hand over hand assist in washing face, grasping cup. Pt gradually attentive to ice and opened mouth when presented with ice chips. Pt masticated and swallowed without overt impairment. Progressed to teaspoons of ginger ale, cup sips of ginger ale and then straw sips of ginger ale. Pt needed intermittent cued to focus attention to cup or straw and initiate. But she did seal lips to cup edge and sip from a straw. If allowed to take consecutive sips there was a cough in 2/5 trials. Pt also accepted teaspoons of strawberry ice cream from partner with good oral control. Pt became progressively more uncomfortable and started crying out. SLP occasionally able to direct her attention to IPAD to touch a button with hand over hand assist that expressed an apparent need. Pt did look at pad and yelling quieted when she heard I want to get in the chair and saw a picture of a chair. Also attended to an angry face and the words this sucks. Global aphasia persists.  PTs boyfriend allowed to give ice chips when alert. SLP will attempt FEES tomorrow at 930. Suspect pt does have swallowing ability, but intake will be very inconsistent given attention impairment and ongoing use of medication for pain and sedation. Hopeful to start a diet soon.    HPI HPI: The pt is a 23 yo female presenting 12/20 with expressive aphasia. 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 MRI showed fairly extensive acute left MCA territory and watershed territory infarcts within the left cerebral hemisphere as well as small acute R  frontoparietal cortical/subcortical infarcts. Pt initially passed the [place] swallow screen but become more aphasic and started to cough with PO intake. 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      SLP Plan  Continue with current plan of care      Recommendations for follow up therapy are one component of a multi-disciplinary discharge planning process, led by the attending physician.  Recommendations may be updated based on patient status, additional functional criteria and insurance authorization.    Recommendations  Diet recommendations: Other(comment) (ice)                      Frequent or constant Supervision/Assistance Dysphagia, unspecified (R13.10)     Continue with current plan of care   Consuelo Fort, MA CCC-SLP  Acute Rehabilitation Services Secure Chat Preferred Office [phone]   [place]  03/10/2023, 11:02 AM

## 2023-03-10 NOTE — Progress Notes (Signed)
 PROGRESS NOTE    Jacqueline [name]  FMW:[id number] DOB: [birth year] DOA: 02/21/2023 PCP: Tonnie Raisin, MD    Chief Complaint  Patient presents with   Code Stroke    Brief Narrative:  [name] is a 23 y.o. 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  The night before presentation, she drank a significant amount of alcohol and took some Suboxone that was not prescribed to her.  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  She went to IR for mechanical thrombectomy, but this was unfortunately unsuccessful.  This morning, patient is noted to have continued expressive aphasia and right hemiparesis.  Patient had a significant episode of agitation early on in the hospitalization, with combativeness requiring use of restraints and initiation of Precedex  drip.  Per patient's boyfriend, she does not drink every day and does not typically use illicit drugs.  Patient subsequently weaned off Precedex  drip and transferred out to ICU.   NIH on Admission 3     SIGNIFICANT HOSPITAL EVENTS 12/20-patient admitted, mechanical thrombectomy attempted but unsuccessful 12/21-significant episode of agitation requiring restraints and Precedex  12/24-off precedex  12/25-continue intermittent fever and agitation, on Abx and resume precedex . Resumed heparin  IV 12/26-repeat MRI showed stroke extension.  12/30-still intermittent agitation, yelling, but distractable. On seroquel  and clonazepam .    Assessment & Plan:   Principal Problem:   Acute ischemic left MCA stroke (HCC) Active Problems:   Anemia   Middle cerebral artery embolism, left   Agitation   Urinary tract infection without hematuria   Lupus anticoagulant disorder (HCC)   Dysphagia   Vitamin B12 deficiency  #1 stroke: Left MCA territory infarct status post unsuccessful IR with extension, etiology: Likely related to  hypercoagulability from positive lupus anticoagulant not on anticoagulation -Patient noted on presentation with concerns for code stroke head CT done with left parietotemporal infarct noted. -Patient underwent CT angiogram head and neck that showed an occluded left proximal to mid M2 MCA. -CT perfusion 21 mm penumbra in the left parietotemporal region. -Patient seen in consultation by neurology and interventional neurology patient status post IR initially T1C13, but progressive worsening stenosis with subsequent complete closure of left M2.  Intermittent mild improvement in caliber with verapamil  and nitroglycerin . -MRI done with an acute left MCA territory infarct without hemorrhagic conversion. -Repeat head CT done showed extension of stroke in the anterior inferior right frontal lobe and right cerebellum. -Patient underwent repeat MRI which showed stroke extension but no hemorrhagic conversion. -Repeat CT head 12/28 showed stable large right MCA and small left MCA/ACA infarcts. -Repeat CT 12/30 showed expected evolutionary changes in the left posterior division left MCA and left ACA/MCA border zone infarcts. -2D echo with normal EF. -EEG done with encephalopathy but no epileptiform activity noted. -Fasting lipid panel with LDL of 42.  Statin currently not indicated.  - Hemoglobin A1c noted at 4.4. -Patient noted not to be on antithrombotic medications prior to admission and started on IV heparin  per neurology. -Neurology recommending continuation of IV heparin  until patient is able to swallow then switching to Eliquis . -Patient currently with cortrack and receiving tube feeds, PT OT following and recommending CIR.  2.  Agitation -Patient noted to have some intermittent episodes of agitation however per neurology noted to be distractible with conversation and working with PT and OT. -Patient noted to have required Precedex  drip which was subsequently weaned off initially placed back on Precedex   and  currently off Precedex . -Per significant other patient does not frequently drink or use illicit substances but still concern for withdrawal given alcohol and Suboxone use prior to admission. -Depakote  level noted at 42. -Was on Depakote  500 every 8 hours subsequently changed to 1000 mg twice daily and changed to 750 every 8 hours. -Noted to be on clonazepam  0.5 mg twice daily and increased to 1 mg twice daily. -Continue Seroquel  50 mg twice daily. -Patient still with bouts of agitation, neurology recommending increase Depakote  to 1000 mg 3 times daily. -Continue IV Ativan  0.5 mg every 8 hours as needed. -Follow.  3.  Positive lupus anticoagulant/history of PE -Patient noted to have unprovoked PE 04/2022 noted to have elevated PTT, DRV VT, hexagonal phase phospholipid and positive anticardiolipin IgM and was on Eliquis . -Noted to have followed up with hematology/oncology 07/2022. -Noted that patient stopped taking Eliquis  08/2022. -Anticardiolipin IgM still elevated during this hospitalization. -Per neurology, hematology recommending okay to start anticoagulation once platelet count is greater than 50. -Patient started on IV heparin . -Follow platelet count.  4.  Fever/UTI -Fever curve noted to have trended down. -Urinalysis done concerning for UTI. -Initial urine cultures with 50,000 colonies of E. coli. -Status post full course of IV Rocephin . -Repeat urine cultures with no growth to date. -Blood cultures negative to date. 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 -Outpatient follow-up when medically stable for positive RPR.  5.  BP -BP noted to be soft but improving. -Not on antihypertensive medications prior to admission. -Follow.  6.  Anemia/thrombocytopenia/B12 deficiency/iron  deficiency -Platelet count improving daily.   -Anemia panel concerning for iron  deficiency anemia.  Folate at 9.9.  Vitamin B12 of 122. -Status post IV iron . -Continue oral iron  supplementation  and vitamin B12 supplementation. -Outpatient follow-up.  7.  Tobacco abuse -Patient noted to smoke cigars. -When more alert will need discussion about total tobacco cessation.  8.  Substance abuse -It is noted that patient uses Suboxone. -UDS negative. -Once more alert will need to discuss substance abuse and cessation with patient.  9.  Dysphagia -Cortrak initially placed pulled out, NG tube placed, cortrak placed back in currently on tube feeds at 55. -SLP following. -Hopefully can wean off tube feeds and transition to oral otherwise may need to consider PEG tube placement.     DVT prophylaxis: Heparin  Code Status: Full Family Communication: No family at bedside. Disposition: TBD  Status is: Inpatient Remains inpatient appropriate because: Severity of illness   Consultants:  PCCM: Dr. Arlinda 02/22/2023 CIR  Procedures:  CT head 02/23/2023, 02/26/2023, 03/01/2023, 03/03/2023 Abdominal films 03/03/2023, 02/28/2023, 02/26/2023, 02/24/2023 Chest x-ray 02/23/2023 MRI brain 02/22/2023, 02/27/2023 Lower extremity Dopplers 02/26/2023 CT angiogram head and neck 02/21/2023 CT head code stroke 02/21/2023 2D echo 02/24/2023 Emergent large vessel occlusion thrombolysis per Dr. Dolphus 02/21/2023 Cortrak placement 02/24/2023 EEG 02/27/2023  Antimicrobials: Anti-infectives (From admission, onward)    Start     Dose/Rate Route Frequency Ordered Stop   02/26/23 1100  cefTRIAXone  (ROCEPHIN ) 2 g in sodium chloride  0.9 % 100 mL IVPB        2 g 200 mL/hr over 30 Minutes Intravenous Every 24 hours 02/26/23 1002 03/01/23 1043   02/24/23 1730  cefTRIAXone  (ROCEPHIN ) 2 g in sodium chloride  0.9 % 100 mL IVPB  Status:  Discontinued        2 g 200 mL/hr over 30 Minutes Intravenous Every 24 hours 02/24/23 1633 02/25/23 0831         Subjective: Sleeping.  Opens eyes to  touch.  Aphasic.   Objective: Vitals:   03/10/23 0116 03/10/23 0316 03/10/23 0734 03/10/23 1113  BP: 106/72  (!) 89/58 114/60 (!) 88/64  Pulse: 88 88 80 100  Resp: 20 19 18 18   Temp: 98.2 F (36.8 C) 98.1 F (36.7 C) 98.2 F (36.8 C) 98 F (36.7 C)  TempSrc: Oral Oral Axillary Axillary  SpO2: 94% 98% 98% 98%  Weight:      Height:        Intake/Output Summary (Last 24 hours) at 03/10/2023 1240 Last data filed at 03/10/2023 0419 Gross per 24 hour  Intake --  Output 800 ml  Net -800 ml   Filed Weights   03/03/23 0500 03/04/23 0500 03/08/23 0500  Weight: 71.2 kg 75.7 kg 77.4 kg    Examination:  General exam: Sleeping.  Cortrak in nares.  Respiratory system: CTAB anterior lung fields.  No wheezes, no crackles, no rhonchi.  Fair air movement.  Cardiovascular system: Regular rate rhythm no murmurs rubs or gallops.  No JVD.  No lower extremity edema. Gastrointestinal system: Abdomen is soft, nontender, nondistended, positive bowel sounds.  No rebound.  No guarding.   Central nervous system: Asleep. Extremities: Unable to assess strength.  Skin: No rashes, lesions or ulcers Psychiatry: Judgement and insight appear poor l. Mood & affect appropriate.     Data Reviewed: I have personally reviewed following labs and imaging studies  CBC: Recent Labs  Lab 03/06/23 0510 03/07/23 0742 03/08/23 0634 03/09/23 0626 03/10/23 0623  WBC 6.8 7.0 6.7 5.6 6.0  HGB 10.3* 10.1* 9.5* 9.8* 9.6*  HCT 31.7* 31.0* 29.1* 29.7* 29.2*  MCV 75.7* 76.0* 76.0* 76.5* 77.5*  PLT 99* 98* 88* 84* 82*    Basic Metabolic Panel: Recent Labs  Lab 03/05/23 0705 03/06/23 0510 03/07/23 0742 03/08/23 0634 03/10/23 0623  NA 136 135 137 134* 136  K 4.1 3.9 4.0 3.8 3.9  CL 103 102 105 103 101  CO2 22 22 24  19* 24  GLUCOSE 95 105* 104* 120* 96  BUN 26* 26* 25* 25* 27*  CREATININE 0.76 0.91 0.84 0.74 0.73  CALCIUM  9.1 9.0 9.2 9.1 9.0    GFR: Estimated Creatinine Clearance: 109.2 mL/min (by C-G formula based on SCr of 0.73 mg/dL).  Liver Function Tests: Recent Labs  Lab 03/08/23 0634 03/10/23 0623   AST 45* 34  ALT 44 31  ALKPHOS 79 76  BILITOT 0.4 0.4  PROT 7.5 7.3  ALBUMIN 2.9* 2.8*    CBG: Recent Labs  Lab 03/09/23 2042 03/10/23 0115 03/10/23 0316 03/10/23 0731 03/10/23 1118  GLUCAP 97 112* 97 111* 115*     Recent Results (from the past 240 hours)  Urine Culture (for pregnant, neutropenic or urologic patients or patients with an indwelling urinary catheter)     Status: None   Collection Time: 03/03/23 10:28 AM   Specimen: Urine, Catheterized  Result Value Ref Range Status   Specimen Description URINE, CATHETERIZED  Final   Special Requests NONE  Final   Culture   Final    NO GROWTH Performed at [facility], [address], [place], [place] [address]    Report Status 03/04/2023 FINAL  Final         Radiology Studies: No results found.       Scheduled Meds:  Chlorhexidine  Gluconate Cloth  6 each Topical Daily   clonazepam   1 mg Oral Once   clonazePAM   1 mg Per Tube BID   vitamin B-12  2,000  mcg Per Tube Daily   feeding supplement (PROSource TF20)  60 mL Per Tube BID   folic acid   1 mg Per Tube Daily   iron  polysaccharides  150 mg Per Tube Daily   multivitamin  15 mL Per Tube Daily   mouth rinse  15 mL Mouth Rinse 4 times per day   polyethylene glycol  17 g Per Tube Daily   QUEtiapine   50 mg Per Tube BID   thiamine   100 mg Per Tube Daily   valproic  acid  1,000 mg Per Tube TID   Continuous Infusions:  feeding supplement (OSMOLITE 1.5 CAL) 1,000 mL (03/09/23 0033)   heparin  1,150 Units/hr (03/09/23 1336)     LOS: 17 days    Time spent: 35 minutes    Toribio Hummer, MD [facility]   To contact the attending provider between 7A-7P or the covering provider during after hours 7P-7A, please log into the web site [email or web address] and access using universal [place] password for that web site. If you do not have the password, please call the hospital operator.  03/10/2023, 12:40 PM

## 2023-03-10 NOTE — Plan of Care (Signed)
 Will continue to monitor.

## 2023-03-10 NOTE — Plan of Care (Signed)
  Problem: Education: Goal: Knowledge of disease or condition will improve Outcome: Progressing Goal: Knowledge of secondary prevention will improve (MUST DOCUMENT ALL) Outcome: Progressing Goal: Knowledge of patient specific risk factors will improve [name] N/A or DELETE if not current risk factor) Outcome: Progressing   Problem: Ischemic Stroke/TIA Tissue Perfusion: Goal: Complications of ischemic stroke/TIA will be minimized Outcome: Progressing   Problem: Coping: Goal: Will verbalize positive feelings about self Outcome: Progressing Goal: Will identify appropriate support needs Outcome: Progressing   Problem: Health Behavior/Discharge Planning: Goal: Ability to manage health-related needs will improve Outcome: Progressing Goal: Goals will be collaboratively established with patient/family Outcome: Progressing   Problem: Self-Care: Goal: Ability to participate in self-care as condition permits will improve Outcome: Progressing Goal: Verbalization of feelings and concerns over difficulty with self-care will improve Outcome: Progressing Goal: Ability to communicate needs accurately will improve Outcome: Progressing   Problem: Nutrition: Goal: Risk of aspiration will decrease Outcome: Progressing Goal: Dietary intake will improve Outcome: Progressing   Problem: Education: Goal: Knowledge of General Education information will improve Description: Including pain rating scale, medication(s)/side effects and non-pharmacologic comfort measures Outcome: Progressing   Problem: Health Behavior/Discharge Planning: Goal: Ability to manage health-related needs will improve Outcome: Progressing   Problem: Clinical Measurements: Goal: Ability to maintain clinical measurements within normal limits will improve Outcome: Progressing Goal: Will remain free from infection Outcome: Progressing Goal: Diagnostic test results will improve Outcome: Progressing Goal: Cardiovascular  complication will be avoided Outcome: Progressing   Problem: Activity: Goal: Risk for activity intolerance will decrease Outcome: Progressing   Problem: Nutrition: Goal: Adequate nutrition will be maintained Outcome: Progressing   Problem: Coping: Goal: Level of anxiety will decrease Outcome: Progressing   Problem: Elimination: Goal: Will not experience complications related to bowel motility Outcome: Progressing Goal: Will not experience complications related to urinary retention Outcome: Progressing   Problem: Pain Management: Goal: General experience of comfort will improve Outcome: Progressing   Problem: Safety: Goal: Ability to remain free from injury will improve Outcome: Progressing   Problem: Skin Integrity: Goal: Risk for impaired skin integrity will decrease Outcome: Progressing   Problem: Education: Goal: Understanding of CV disease, CV risk reduction, and recovery process will improve Outcome: Progressing Goal: Individualized Educational Video(s) Outcome: Progressing   Problem: Activity: Goal: Ability to return to baseline activity level will improve Outcome: Progressing   Problem: Cardiovascular: Goal: Ability to achieve and maintain adequate cardiovascular perfusion will improve Outcome: Progressing   Problem: Health Behavior/Discharge Planning: Goal: Ability to safely manage health-related needs after discharge will improve Outcome: Progressing

## 2023-03-10 NOTE — TOC Progression Note (Signed)
 Transition of Care [facility]) - Progression Note    Patient Details  Name: Jacqueline [name] MRN: [id number] Date of Birth: 12-23-2000  [facility] CM/SW Contact  Andrez JULIANNA George, RN Phone Number: 03/10/2023, 11:51 AM  Clinical Narrative:     Pt continues with cortrak. CiR following for her to be medically ready for rehab.  TOC following.   Expected Discharge Plan: IP Rehab Facility    Expected Discharge Plan and Services                                               Social Determinants of Health (SDOH) Interventions SDOH Screenings   Food Insecurity: No Food Insecurity (03/07/2023)  Housing: Patient Unable To Answer (02/21/2023)  Transportation Needs: Patient Unable To Answer (02/21/2023)  Utilities: Patient Unable To Answer (02/21/2023)  Financial Resource Strain: Not on File (06/21/2021)   Received from [place], [place]  Physical Activity: Not on File (06/21/2021)   Received from [place], [place]  Social Connections: Not on File (11/16/2022)   Received from [facility]  Stress: Not on File (06/21/2021)   Received from [place], [place]  Tobacco Use: High Risk (02/21/2023)    Readmission Risk Interventions     No data to display

## 2023-03-10 NOTE — Progress Notes (Signed)
 Inpatient Rehab Admissions Coordinator:   Continuing to follow for potential CIR admission. Patient is not at a level to tolerate CIR yet.   Rehab Admissons Coordinator [place], [place], [place] [phone]

## 2023-03-10 NOTE — Progress Notes (Signed)
 Physical Therapy Treatment Patient Details Name: Jacqueline [name] MRN: [id number] DOB: 15-May-2000 Today's Date: 03/10/2023   History of Present Illness The pt is a 23 yo female presenting 12/20 with expressive aphasia. 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    PT Comments  Patient sleeping soundly on arrival. With minimal stimulation, pt open's eyes, yet quickly returns to eyes closed. Improved alertness once sitting EOB, however not as alert as previous sessions note (head hanging in flexion, no active use of LUE). Pt did participate with LLE in sit to stand +2 max assist. Returned to bed due to pt lethargic/obtunded. Noted she had clonopine and seroquel  as scheduled at 10:00 a.m. Movig forward will try to work with pt before 10 or in later afternoon.    If plan is discharge home, recommend the following: Two people to help with walking and/or transfers;Two people to help with bathing/dressing/bathroom;Assistance with cooking/housework;Assistance with feeding;Direct supervision/assist for medications management;Direct supervision/assist for financial management;Assist for transportation;Help with stairs or ramp for entrance;Supervision due to cognitive status   Can travel by private vehicle        Equipment Recommendations  Other (comment) (TBA)    Recommendations for Other Services       Precautions / Restrictions Precautions Precautions: Fall Precaution Comments: cortrak Restrictions Weight Bearing Restrictions Per Provider Order: No     Mobility  Bed Mobility Overal bed mobility: Needs Assistance Bed Mobility: Rolling, Sidelying to Sit, Sit to Supine Rolling: Total assist Sidelying to sit: +2 for safety/equipment, Total assist, HOB elevated   Sit to supine: +2 for physical assistance, Total assist   General bed  mobility comments: pt sleeping on arrival; briefly opens eyes; assisted to EOB to incr alertness with pt then mostly keeping eyes open    Transfers Overall transfer level: Needs assistance Equipment used: 2 person hand held assist Transfers: Sit to/from Stand Sit to Stand: Max assist, +2 physical assistance           General transfer comment: stood from EOB with lifting help, R knee and hip block; unable to achieve full upright with pt's head hanging and began leaning to rt; returned to sitting with pt's eyes noted to be closed; OOB to chair deferred due to obtunded state    Ambulation/Gait               General Gait Details: NT   Stairs             Wheelchair Mobility     Tilt Bed    Modified Rankin (Stroke Patients Only) Modified Rankin (Stroke Patients Only) Pre-Morbid Rankin Score: No symptoms Modified Rankin: Severe disability     Balance Overall balance assessment: Needs assistance Sitting-balance support: Feet supported Sitting balance-Leahy Scale: Zero Sitting balance - Comments: max A to total for sitting balance today leaning to R; obtunded with periods of eye opening Postural control: Right lateral lean, Posterior lean Standing balance support: Bilateral upper extremity supported Standing balance-Leahy Scale: Zero Standing balance comment: +2 for standing                            Cognition Arousal: Obtunded, Suspect due to medications Behavior During Therapy: Flat affect Overall Cognitive Status: Difficult to assess Area of Impairment: Attention, Following commands  Current Attention Level: Focused   Following Commands:  (followed no commands)                Exercises Other Exercises Other Exercises: no active movement Rt side noted, LUE appeared to be posturing and stiff into flexion (noted this was also the case per OT note during previous session)    General Comments General comments  (skin integrity, edema, etc.): Noted pt had clonopin and seroquel  at 10:00 a.m. Will try to arrange to see pt prior to 10:00 or later in afternoon in the future      Pertinent Vitals/Pain Pain Assessment Pain Assessment: Faces Faces Pain Scale: No hurt    Home Living                          Prior Function            PT Goals (current goals can now be found in the care plan section) Acute Rehab PT Goals Patient Stated Goal: return to independence, to see her kids PT Goal Formulation: Patient unable to participate in goal setting Time For Goal Achievement: 03/24/23 Potential to Achieve Goals: Good Progress towards PT goals: Not progressing toward goals - comment (today obtunded)    Frequency    Min 1X/week      PT Plan      Co-evaluation              AM-PAC PT 6 Clicks Mobility   Outcome Measure  Help needed turning from your back to your side while in a flat bed without using bedrails?: Total Help needed moving from lying on your back to sitting on the side of a flat bed without using bedrails?: Total Help needed moving to and from a bed to a chair (including a wheelchair)?: Total Help needed standing up from a chair using your arms (e.g., wheelchair or bedside chair)?: Total Help needed to walk in hospital room?: Total Help needed climbing 3-5 steps with a railing? : Total 6 Click Score: 6    End of Session Equipment Utilized During Treatment: Gait belt Activity Tolerance: Patient limited by lethargy Patient left: with call bell/phone within reach;in bed;with bed alarm set Nurse Communication: Mobility status;Need for lift equipment;Other (comment) (?using LUE functionally? noted incr tone) PT Visit Diagnosis: Other abnormalities of gait and mobility (R26.89);Hemiplegia and hemiparesis;Other symptoms and signs involving the nervous system (R29.898) Hemiplegia - Right/Left: Right Hemiplegia - dominant/non-dominant: Dominant Hemiplegia - caused  by: Cerebral infarction     Time: 8669-8645 PT Time Calculation (min) (ACUTE ONLY): 24 min  Charges:    $Neuromuscular Re-education: 23-37 mins PT General Charges $$ ACUTE PT VISIT: 1 Visit                      Macario RAMAN, PT Acute Rehabilitation Services  Office [phone]    Macario SHAUNNA Soja 03/10/2023, 2:18 PM

## 2023-03-10 NOTE — Progress Notes (Signed)
 PHARMACY - ANTICOAGULATION CONSULT NOTE  Pharmacy Consult:  Heparin  Indication: Lupus AC and endoluminal thrombus   No Known Allergies  Patient Measurements: Height: 5' 4 (162.6 cm) Weight: 77.4 kg (170 lb 10.2 oz) IBW/kg (Calculated) : 54.7 Heparin  Dosing Weight: 72 kg  Vital Signs: Temp: 98.1 F (36.7 C) (01/06 0316) Temp Source: Oral (01/06 0316) BP: 89/58 (01/06 0316) Pulse Rate: 88 (01/06 0316)  Labs: Recent Labs    03/07/23 0742 03/07/23 1607 03/08/23 0634 03/09/23 0626 03/10/23 0623  HGB 10.1*  --  9.5* 9.8* 9.6*  HCT 31.0*  --  29.1* 29.7* 29.2*  PLT 98*  --  88* 84* 82*  HEPARINUNFRC 0.56   < > 0.36 0.33 0.35  CREATININE 0.84  --  0.74  --  0.73   < > = values in this interval not displayed.    Estimated Creatinine Clearance: 109.2 mL/min (by C-G formula based on SCr of 0.73 mg/dL).   Medical History: Past Medical History:  Diagnosis Date   Bacterial vaginitis 07/10/2016   Candida vaginitis 07/24/2016   Headache in pregnancy, antepartum, third trimester 07/24/2016   Kell isoimmunization during pregnancy     Assessment: 22 YOF presented with acute CVA and MRI also shows endoluminal thrombus.  Patient has a history of unprovoked PE (04/2022) treated with Eliquis  and no longer on anticoagulation. Hx of thrombocytopenia. Pharmacy consulted to dose heparin .  Heparin  level therapeutic at 0.35.SABRA Plt remains >50k. Hgb 9-10s  Goal of Therapy:  Heparin  level 0.3-0.5 units/ml Monitor platelets by anticoagulation protocol: Yes   Plan:  Continue heparin  gtt at 1150 units/hr Daily heparin  level, CBC, s/s bleeding F/U ability to tolerate po  Prudence Dollar, PharmD, BCPS 03/10/2023 7:31 AM

## 2023-03-11 DIAGNOSIS — I63512 Cerebral infarction due to unspecified occlusion or stenosis of left middle cerebral artery: Secondary | ICD-10-CM | POA: Diagnosis not present

## 2023-03-11 DIAGNOSIS — D649 Anemia, unspecified: Secondary | ICD-10-CM | POA: Diagnosis not present

## 2023-03-11 DIAGNOSIS — N3000 Acute cystitis without hematuria: Secondary | ICD-10-CM | POA: Diagnosis not present

## 2023-03-11 DIAGNOSIS — R451 Restlessness and agitation: Secondary | ICD-10-CM | POA: Diagnosis not present

## 2023-03-11 LAB — BASIC METABOLIC PANEL
Anion gap: 10 (ref 5–15)
BUN: 22 mg/dL — ABNORMAL HIGH (ref 6–20)
CO2: 24 mmol/L (ref 22–32)
Calcium: 8.9 mg/dL (ref 8.9–10.3)
Chloride: 103 mmol/L (ref 98–111)
Creatinine, Ser: 0.68 mg/dL (ref 0.44–1.00)
GFR, Estimated: >60 mL/min (ref >60)
Glucose, Bld: 100 mg/dL — ABNORMAL HIGH (ref 70–99)
Potassium: 3.8 mmol/L (ref 3.5–5.1)
Sodium: 137 mmol/L (ref 135–145)

## 2023-03-11 LAB — GLUCOSE, CAPILLARY
Glucose-Capillary: 99 mg/dL (ref 70–99)
Glucose-Capillary: 102 mg/dL — ABNORMAL HIGH (ref 70–99)
Glucose-Capillary: 113 mg/dL — ABNORMAL HIGH (ref 70–99)
Glucose-Capillary: 113 mg/dL — ABNORMAL HIGH (ref 70–99)
Glucose-Capillary: 103 mg/dL — ABNORMAL HIGH (ref 70–99)

## 2023-03-11 LAB — CBC
HCT: 29.8 % — ABNORMAL LOW (ref 36.0–46.0)
Hemoglobin: 9.9 g/dL — ABNORMAL LOW (ref 12.0–15.0)
MCH: 25.4 pg — ABNORMAL LOW (ref 26.0–34.0)
MCHC: 33.2 g/dL (ref 30.0–36.0)
MCV: 76.6 fL — ABNORMAL LOW (ref 80.0–100.0)
Platelets: 79 10*3/uL — ABNORMAL LOW (ref 150–400)
RBC: 3.89 MIL/uL (ref 3.87–5.11)
RDW: 25.8 % — ABNORMAL HIGH (ref 11.5–15.5)
WBC: 5.1 10*3/uL (ref 4.0–10.5)
nRBC: 0 % (ref 0.0–0.2)

## 2023-03-11 LAB — HEPARIN LEVEL (UNFRACTIONATED): Heparin Unfractionated: 0.38 [IU]/mL (ref 0.30–0.70)

## 2023-03-11 MED ORDER — FREE WATER
100.0000 mL | Status: DC
  Administered 2023-03-11 – 2023-03-24 (×73): 100 mL

## 2023-03-11 NOTE — Plan of Care (Signed)
  Problem: Education: Goal: Knowledge of disease or condition will improve Outcome: Progressing Goal: Knowledge of secondary prevention will improve (MUST DOCUMENT ALL) Outcome: Progressing Goal: Knowledge of patient specific risk factors will improve [name] N/A or DELETE if not current risk factor) Outcome: Progressing   Problem: Ischemic Stroke/TIA Tissue Perfusion: Goal: Complications of ischemic stroke/TIA will be minimized Outcome: Progressing   Problem: Coping: Goal: Will verbalize positive feelings about self Outcome: Progressing Goal: Will identify appropriate support needs Outcome: Progressing   Problem: Health Behavior/Discharge Planning: Goal: Ability to manage health-related needs will improve Outcome: Progressing Goal: Goals will be collaboratively established with patient/family Outcome: Progressing   Problem: Self-Care: Goal: Ability to participate in self-care as condition permits will improve Outcome: Progressing Goal: Verbalization of feelings and concerns over difficulty with self-care will improve Outcome: Progressing Goal: Ability to communicate needs accurately will improve Outcome: Progressing   Problem: Nutrition: Goal: Risk of aspiration will decrease Outcome: Progressing Goal: Dietary intake will improve Outcome: Progressing   Problem: Education: Goal: Knowledge of General Education information will improve Description: Including pain rating scale, medication(s)/side effects and non-pharmacologic comfort measures Outcome: Progressing   Problem: Health Behavior/Discharge Planning: Goal: Ability to manage health-related needs will improve Outcome: Progressing   Problem: Clinical Measurements: Goal: Ability to maintain clinical measurements within normal limits will improve Outcome: Progressing Goal: Will remain free from infection Outcome: Progressing Goal: Diagnostic test results will improve Outcome: Progressing Goal: Cardiovascular  complication will be avoided Outcome: Progressing   Problem: Activity: Goal: Risk for activity intolerance will decrease Outcome: Progressing   Problem: Nutrition: Goal: Adequate nutrition will be maintained Outcome: Progressing   Problem: Coping: Goal: Level of anxiety will decrease Outcome: Progressing   Problem: Elimination: Goal: Will not experience complications related to bowel motility Outcome: Progressing Goal: Will not experience complications related to urinary retention Outcome: Progressing   Problem: Pain Management: Goal: General experience of comfort will improve Outcome: Progressing   Problem: Safety: Goal: Ability to remain free from injury will improve Outcome: Progressing   Problem: Skin Integrity: Goal: Risk for impaired skin integrity will decrease Outcome: Progressing   Problem: Education: Goal: Understanding of CV disease, CV risk reduction, and recovery process will improve Outcome: Progressing Goal: Individualized Educational Video(s) Outcome: Progressing   Problem: Activity: Goal: Ability to return to baseline activity level will improve Outcome: Progressing   Problem: Cardiovascular: Goal: Ability to achieve and maintain adequate cardiovascular perfusion will improve Outcome: Progressing   Problem: Health Behavior/Discharge Planning: Goal: Ability to safely manage health-related needs after discharge will improve Outcome: Progressing

## 2023-03-11 NOTE — Progress Notes (Signed)
 Occupational Therapy Treatment Patient Details Name: Jacqueline [name] MRN: [id number] DOB: [birth year] Today's Date: 03/11/2023   History of present illness The pt is a 23 yo female presenting 12/20 with expressive aphasia. 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   OT comments  Pt seen in conjunction with PT to maximize pts activity tolerance and optimize pt participation. Pt continues to present with impaired balance, decreased activity tolerance and R sided hemiplegia. Pt currently requires total A +2 for bed mobility, MAX A +2 for sit>stands and to pivot to recliner. Pt continues to present with increased tone in RUE but able to range passively. Pt initiating using LUE more this session, however pt requires total A for grooming tasks. Pt tracking R<>L today. Pt would continue to benefit from skilled occupational therapy while admitted and after d/c to address the below listed limitations in order to improve overall functional mobility and facilitate independence with BADL participation. DC plan remains appropriate, will follow acutely per POC.          If plan is discharge home, recommend the following:  Two people to help with walking and/or transfers;Two people to help with bathing/dressing/bathroom;Assistance with cooking/housework;Direct supervision/assist for medications management;Assistance with feeding;Direct supervision/assist for financial management;Help with stairs or ramp for entrance;Assist for transportation;Supervision due to cognitive status   Equipment Recommendations  Other (comment)    Recommendations for Other Services      Precautions / Restrictions Precautions Precautions: Fall Precaution Comments: cortrak Restrictions Weight Bearing Restrictions Per Provider Order: No       Mobility Bed  Mobility Overal bed mobility: Needs Assistance Bed Mobility: Rolling, Sidelying to Sit Rolling: Total assist Sidelying to sit: +2 for safety/equipment, Total assist, HOB elevated       General bed mobility comments: d/t level of arousal, pt required total A +2 to transition from supine>sit to pts R side    Transfers Overall transfer level: Needs assistance Equipment used: 2 person hand held assist Transfers: Sit to/from Stand, Bed to chair/wheelchair/BSC Sit to Stand: Max assist, +2 physical assistance Stand pivot transfers: Max assist, +2 physical assistance         General transfer comment: pt able to stand from EOB wtih MAX A +2 with R knee and hip blocked, pt needed lift assist to elevate trunk into standing. pt able to pivot to pts L side with MAX A+2 to support affected side and initiate pivotal steps     Balance Overall balance assessment: Needs assistance Sitting-balance support: Feet supported Sitting balance-Leahy Scale: Zero Sitting balance - Comments: pt required MAX A for static sitting EOB d/t R lean Postural control: Right lateral lean Standing balance support: Bilateral upper extremity supported Standing balance-Leahy Scale: Zero Standing balance comment: reliant on external assist for standing                           ADL either performed or assessed with clinical judgement   ADL Overall ADL's : Needs assistance/impaired     Grooming: Total assistance;Wash/dry face Grooming Details (indicate cue type and reason): hand over hand for washing face                 Toilet Transfer: Maximal assistance;+2 for physical assistance;+2 for safety/equipment;Stand-pivot Toilet Transfer Details (indicate cue type and reason): simulated via stand pivot to recliner with face to  face to assist with use of gait belt         Functional mobility during ADLs: +2 for physical assistance;+2 for safety/equipment;Maximal assistance General ADL Comments: ADL  participation impacted by impaired balance, RUE hemiplegia, R sided inattention and impaired level of arousal    Extremity/Trunk Assessment Upper Extremity Assessment Upper Extremity Assessment: RUE deficits/detail;LUE deficits/detail RUE Deficits / Details: no AROM noted, tone in elbow. RUE Sensation: decreased light touch;decreased proprioception RUE Coordination: decreased fine motor;decreased gross motor LUE Deficits / Details: tightness with elbow extension but able to get full PROM. pt did initiate moving LUE during session LUE Coordination: decreased gross motor;decreased fine motor   Lower Extremity Assessment Lower Extremity Assessment: Defer to PT evaluation        Vision   Vision Assessment?: Vision impaired- to be further tested in functional context Additional Comments: able to track to R and L side with increased time and max cues   Perception     Praxis      Cognition Arousal: Lethargic, Alert (initially lethargic but did arouse more as session progressed) Behavior During Therapy: Flat affect Overall Cognitive Status: Difficult to assess Area of Impairment: Attention, Following commands                   Current Attention Level: Focused   Following Commands: Follows one step commands inconsistently, Follows one step commands with increased time       General Comments: pt was able to visually scan to locate her boyfriend when cued with increased time but continues to be limited by impaired level of arousal and decreased ability to attend to session and follow commands consistently        Exercises      Shoulder Instructions       General Comments pts boyfriend present during session    Pertinent Vitals/ Pain       Pain Assessment Pain Assessment: Faces Faces Pain Scale: Hurts a little bit Pain Location: generalized with mobility, unable to localize Pain Descriptors / Indicators: Moaning, Grimacing Pain Intervention(s): Monitored during  session  Home Living                                          Prior Functioning/Environment              Frequency  Min 1X/week        Progress Toward Goals  OT Goals(current goals can now be found in the care plan section)  Progress towards OT goals: Progressing toward goals  Acute Rehab OT Goals OT Goal Formulation: Patient unable to participate in goal setting Time For Goal Achievement: 03/20/23 (seen by OTR 1/2) Potential to Achieve Goals: Good  Plan      Co-evaluation    PT/OT/SLP Co-Evaluation/Treatment: Yes Reason for Co-Treatment: Complexity of the patient's impairments (multi-system involvement);To address functional/ADL transfers   OT goals addressed during session: ADL's and self-care      AM-PAC OT 6 Clicks Daily Activity     Outcome Measure   Help from another person eating meals?: Total Help from another person taking care of personal grooming?: Total Help from another person toileting, which includes using toliet, bedpan, or urinal?: Total Help from another person bathing (including washing, rinsing, drying)?: Total Help from another person to put on and taking off regular upper body clothing?: Total Help from another person to put on and  taking off regular lower body clothing?: Total 6 Click Score: 6    End of Session Equipment Utilized During Treatment: Gait belt  OT Visit Diagnosis: Unsteadiness on feet (R26.81);Other abnormalities of gait and mobility (R26.89);Muscle weakness (generalized) (M62.81);Hemiplegia and hemiparesis;Pain Hemiplegia - Right/Left: Right Hemiplegia - caused by: Cerebral infarction   Activity Tolerance Patient tolerated treatment well   Patient Left in bed;with call bell/phone within reach;with chair alarm set   Nurse Communication Mobility status;Need for lift equipment        Time: 0911-0929 OT Time Calculation (min): 18 min  Charges: OT General Charges $OT Visit: 1 Visit  Ronal Mallie POUR., COTA/L Acute Rehabilitation Services [phone]   Ronal Mallie Needy 03/11/2023, 11:41 AM

## 2023-03-11 NOTE — Progress Notes (Signed)
 PHARMACY - ANTICOAGULATION CONSULT NOTE  Pharmacy Consult:  Heparin  Indication: Lupus AC and endoluminal thrombus   No Known Allergies  Patient Measurements: Height: 5' 4 (162.6 cm) Weight: 77.4 kg (170 lb 10.2 oz) IBW/kg (Calculated) : 54.7 Heparin  Dosing Weight: 72 kg  Vital Signs: Temp: 97.9 F (36.6 C) (01/07 0744) Temp Source: Oral (01/07 0744) BP: 94/56 (01/07 0744) Pulse Rate: 78 (01/07 0744)  Labs: Recent Labs    03/09/23 0626 03/10/23 0623 03/11/23 0547  HGB 9.8* 9.6* 9.9*  HCT 29.7* 29.2* 29.8*  PLT 84* 82* 79*  HEPARINUNFRC 0.33 0.35 0.38  CREATININE  --  0.73 0.68    Estimated Creatinine Clearance: 109.2 mL/min (by C-G formula based on SCr of 0.68 mg/dL).   Medical History: Past Medical History:  Diagnosis Date   Bacterial vaginitis 07/10/2016   Candida vaginitis 07/24/2016   Headache in pregnancy, antepartum, third trimester 07/24/2016   Kell isoimmunization during pregnancy     Assessment: 22 YOF presented with acute CVA and MRI also shows endoluminal thrombus.  Patient has a history of unprovoked PE (04/2022) treated with Eliquis  and no longer on anticoagulation. Hx of thrombocytopenia. Pharmacy consulted to dose heparin .  Heparin  level therapeutic at 0.38.SABRA Plt remains >50k. Hgb 9-10s  Goal of Therapy:  Heparin  level 0.3-0.5 units/ml Monitor platelets by anticoagulation protocol: Yes   Plan:  Continue heparin  gtt at 1150 units/hr Daily heparin  level, CBC, s/s bleeding F/U ability to tolerate po  Sergio Batch, PharmD, BCIDP, AAHIVP, CPP Infectious Disease Pharmacist 03/11/2023 11:01 AM

## 2023-03-11 NOTE — Progress Notes (Signed)
 Physical Therapy Treatment Patient Details Name: Jacqueline [name] MRN: [id number] DOB: February 02, 2001 Today's Date: 03/11/2023   History of Present Illness The pt is a 23 yo female presenting 12/20 with expressive aphasia. 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    PT Comments  Came to see pt prior to 10:00 a.m. meds, however pt sleeping soundly from overnight meds. Boyfriend present and assisted with awakening pt with understanding that Speech Therapy was coming soon to do her swallow study. Goal was to get pt to the chair for better positioning during swallow evaluation. Pt continues to require +2 max to total assist for all mobility, however partially limited by lethargy.     If plan is discharge home, recommend the following: Two people to help with walking and/or transfers;Two people to help with bathing/dressing/bathroom;Assistance with cooking/housework;Assistance with feeding;Direct supervision/assist for medications management;Direct supervision/assist for financial management;Assist for transportation;Help with stairs or ramp for entrance;Supervision due to cognitive status   Can travel by private vehicle        Equipment Recommendations  Other (comment) (TBA)    Recommendations for Other Services       Precautions / Restrictions Precautions Precautions: Fall Precaution Comments: cortrak Restrictions Weight Bearing Restrictions Per Provider Order: No     Mobility  Bed Mobility Overal bed mobility: Needs Assistance Bed Mobility: Rolling, Sidelying to Sit, Sit to Supine Rolling: Total assist Sidelying to sit: +2 for safety/equipment, Total assist, HOB elevated   Sit to supine: +2 for physical assistance, Total assist   General bed mobility comments: pt sleeping on arrival; briefly opens eyes; assisted to EOB to incr  alertness with pt then mostly keeping eyes open    Transfers Overall transfer level: Needs assistance Equipment used: 2 person hand held assist Transfers: Sit to/from Stand Sit to Stand: Max assist, +2 physical assistance Stand pivot transfers: Max assist, +2 physical assistance         General transfer comment: pt able to stand from EOB wtih MAX A +2 with R knee and hip blocked, pt needed lift assist to elevate trunk into standing. pt able to pivot to pts L side with MAX A+2 to support affected side and initiate pivotal steps    Ambulation/Gait               General Gait Details: NT   Stairs             Wheelchair Mobility     Tilt Bed    Modified Rankin (Stroke Patients Only) Modified Rankin (Stroke Patients Only) Pre-Morbid Rankin Score: No symptoms Modified Rankin: Severe disability     Balance Overall balance assessment: Needs assistance Sitting-balance support: Feet supported Sitting balance-Leahy Scale: Zero Sitting balance - Comments: max A to total for sitting balance today leaning to R; obtunded with periods of eye opening Postural control: Right lateral lean, Posterior lean Standing balance support: Bilateral upper extremity supported Standing balance-Leahy Scale: Zero Standing balance comment: +2 for standing                            Cognition Arousal: Suspect due to medications, Lethargic Behavior During Therapy: Flat affect Overall Cognitive Status: Difficult to assess Area of Impairment: Attention, Following commands                   Current Attention  Level: Focused   Following Commands: Follows one step commands inconsistently       General Comments: pt was able to visually scan to locate her boyfriend when cued with increased time but continues to be limited by impaired level of arousal and decreased ability to attend to session and follow commands consistently        Exercises Other Exercises Other  Exercises: no active movement Rt side noted, LUE appeared to be posturing and stiff into flexion (noted this was also the case per OT note during previous session)    General Comments General comments (skin integrity, edema, etc.): pts boyfriend present during session      Pertinent Vitals/Pain Pain Assessment Pain Assessment: Faces Faces Pain Scale: No hurt    Home Living                          Prior Function            PT Goals (current goals can now be found in the care plan section) Acute Rehab PT Goals Patient Stated Goal: return to independence, to see her kids PT Goal Formulation: Patient unable to participate in goal setting Time For Goal Achievement: 03/24/23 Potential to Achieve Goals: Good Progress towards PT goals: Progressing toward goals    Frequency    Min 1X/week      PT Plan      Co-evaluation PT/OT/SLP Co-Evaluation/Treatment: Yes Reason for Co-Treatment: Complexity of the patient's impairments (multi-system involvement);To address functional/ADL transfers PT goals addressed during session: Mobility/safety with mobility;Balance OT goals addressed during session: ADL's and self-care      AM-PAC PT 6 Clicks Mobility   Outcome Measure  Help needed turning from your back to your side while in a flat bed without using bedrails?: Total Help needed moving from lying on your back to sitting on the side of a flat bed without using bedrails?: Total Help needed moving to and from a bed to a chair (including a wheelchair)?: Total Help needed standing up from a chair using your arms (e.g., wheelchair or bedside chair)?: Total Help needed to walk in hospital room?: Total Help needed climbing 3-5 steps with a railing? : Total 6 Click Score: 6    End of Session Equipment Utilized During Treatment: Gait belt Activity Tolerance: Patient limited by lethargy Patient left: with call bell/phone within reach;in chair;with chair alarm set;with  family/visitor present Nurse Communication: Mobility status;Need for lift equipment PT Visit Diagnosis: Other abnormalities of gait and mobility (R26.89);Hemiplegia and hemiparesis;Other symptoms and signs involving the nervous system (R29.898) Hemiplegia - Right/Left: Right Hemiplegia - dominant/non-dominant: Dominant Hemiplegia - caused by: Cerebral infarction     Time: 0902-0929 PT Time Calculation (min) (ACUTE ONLY): 27 min  Charges:    $Therapeutic Activity: 8-22 mins PT General Charges $$ ACUTE PT VISIT: 1 Visit                      Macario RAMAN, PT Acute Rehabilitation Services  Office [phone]    Macario SHAUNNA Soja 03/11/2023, 1:29 PM

## 2023-03-11 NOTE — Progress Notes (Signed)
  Inpatient Rehabilitation Admissions Coordinator   Patient not at a level to pursue Cir admit Auth at this time.  Ottie Glazier, RN, MSN Rehab Admissions Coordinator [phone] 03/11/2023 2:59 PM

## 2023-03-11 NOTE — Progress Notes (Signed)
 Nutrition Follow-up  DOCUMENTATION CODES:  Not applicable  INTERVENTION:  Continue TF via Cortrak: Osmolite 1.5 at 55 ml/h Prosource TF 20 60 ml BID Free water  flush 100 q4h Provides 2240 kcal, 122 gm protein, 1003 ml free water  daily. ( free water  = TF+flush) Continue MVI, Vitamin B12, thiamine , and iron  supplementation. Mighty Shake TID with meals, each supplement provides 330 kcal and 9 grams of protein  NUTRITION DIAGNOSIS:  Inadequate oral intake related to inability to eat as evidenced by NPO status. - remains applicable  GOAL:  Patient will meet greater than or equal to 90% of their needs - progressing, TF at goal, diet advanced  MONITOR:  TF tolerance, PO intake, Supplement acceptance, Labs, Weight trends  REASON FOR ASSESSMENT:  Consult Enteral/tube feeding initiation and management  ASSESSMENT:  Pt with PMH of hemoglobin C trait, unprovoked PE not currently on anticoagulation admitted with aphasia after heavy alcohol use and recreation use of Suboxone. 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  12/20 - s/p mechanical thrombectomy attempted but unsuccessful, extubated after  12/21 - significant agitation requiring restrains and Precedex  12/23 - s/p cortrak placement; per xray tip in distal stomach 12/26 - pt pulled out cortrak, MRI showed stroke extension  12/27- cortak replaced (distal stomach vs proximal duodenum) 1/7 - FEES, DYS3, nectar thick  Pt resting in bedside chair at the time of assessment. No visitors present to provide a hx at this time.   Noted that pt underwent FEES after RD assessment and was able to have diet put into place. SLP noted in assessment yesterday that pt with poor attention and needs cuing to maintain focus while eating and drinking. Suspect that it will take some time to be proficient enough to maintain nutrition  status orally and cortrak should be maintained at full rate until pt consistently eat something for all meals. Will adjust TF rate at that time if PO is providing a significant amount of nutrition.   Admit weight: 72.6 Current weight: 77.4 kg   Nutritionally Relevant Medications: Scheduled Meds:  vitamin B-12  2,000 mcg Per Tube Daily   PROSource TF20  60 mL Per Tube BID   folic acid   1 mg Per Tube Daily   iron  polysaccharides  150 mg Per Tube Daily   multivitamin  15 mL Per Tube Daily   polyethylene glycol  17 g Per Tube Daily   thiamine   100 mg Per Tube Daily   Continuous Infusions:  feeding supplement (OSMOLITE 1.5 CAL) 1,000 mL (03/09/23 0033)   PRN Meds: ondansetron , senna-docusate, simethicone   Labs Reviewed: BUN 22 CBG ranges from 76-135 mg/dL over the last 24 hours HgbA1c 4.4% (12/20)  NUTRITION - FOCUSED PHYSICAL EXAM: Flowsheet Row Most Recent Value  Orbital Region No depletion  Upper Arm Region No depletion  Thoracic and Lumbar Region Mild depletion  Buccal Region No depletion  Temple Region No depletion  Clavicle Bone Region No depletion  Clavicle and Acromion Bone Region No depletion  Scapular Bone Region No depletion  Dorsal Hand Unable to assess  Patellar Region No depletion  Anterior Thigh Region No depletion  Posterior Calf Region No depletion  Edema (RD Assessment) None  Hair Reviewed  Eyes Unable to assess  Mouth Unable to assess  Skin Reviewed  Nails Unable to assess    Diet Order:   Diet Order  DIET DYS 3 Fluid consistency: Nectar Thick  Diet effective now                   EDUCATION NEEDS:  No education needs have been identified at this time  Skin:  Skin Assessment: Reviewed RN Assessment  Last BM:  1/3  Height:  Ht Readings from Last 1 Encounters:  02/21/23 5' 4 (1.626 m)    Weight:  Wt Readings from Last 1 Encounters:  03/08/23 77.4 kg    Ideal Body Weight:  54.5 kg  BMI:  Body mass index is 29.29  kg/m.  Estimated Nutritional Needs:  Kcal:  2000-2200 Protein:  100-115 grams Fluid:  >2 L/day    Vernell Lukes, RD, LDN Registered Dietitian II Please reach out via secure chat Weekend on-call pager # available in [facility]

## 2023-03-11 NOTE — Procedures (Signed)
 Objective Swallowing Evaluation: Type of Study: FEES-Fiberoptic Endoscopic Evaluation of Swallow   Patient Details  Name: Jacqueline [name] MRN: [id number] Date of Birth: 2001-02-08  Today's Date: 03/11/2023 Time: SLP Start Time (ACUTE ONLY): 0940 -SLP Stop Time (ACUTE ONLY): 1000  SLP Time Calculation (min) (ACUTE ONLY): 20 min   Past Medical History:  Past Medical History:  Diagnosis Date   Bacterial vaginitis 07/10/2016   Candida vaginitis 07/24/2016   Headache in pregnancy, antepartum, third trimester 07/24/2016   Kell isoimmunization during pregnancy    Past Surgical History:  Past Surgical History:  Procedure Laterality Date   CESAREAN SECTION N/A 09/04/2016   Procedure: CESAREAN SECTION;  Surgeon: [name], DO;  Location: [facility];  Service: Obstetrics;  Laterality: N/A;   CESAREAN SECTION Bilateral    IR CT HEAD LTD  02/21/2023   IR PERCUTANEOUS ART THROMBECTOMY/INFUSION INTRACRANIAL INC DIAG ANGIO  02/21/2023   RADIOLOGY WITH ANESTHESIA N/A 02/21/2023   Procedure: IR WITH ANESTHESIA;  Surgeon: Dolphus Carrion, MD;  Location: [facility];  Service: Radiology;  Laterality: N/A;   HPI: The pt is a 23 yo female presenting 12/20 with expressive aphasia. 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 MRI showed fairly extensive acute left MCA territory and watershed territory infarcts within the left cerebral hemisphere as well as small acute R frontoparietal cortical/subcortical infarcts. Pt initially passed the Yale swallow screen but become more aphasic and started to cough with PO intake. 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   Subjective: alert, minimal verbalizations    Recommendations for follow up therapy are one component of a multi-disciplinary discharge planning process, led by the attending physician.  Recommendations may be updated based on  patient status, additional functional criteria and insurance authorization.  Assessment / Plan / Recommendation     03/11/2023   11:00 AM  Clinical Impressions  Clinical Impression FEES attempted for instrumental assessment of swallowing prior to diet initiation. Pt up in chair, Partner Tay assisted in feeding her. Pt was mildly drowsy when beginning exam, but did start to yell when scope placed. Regardless she was attentive to PO after moderate tactile cues were given to improve her awareness. Pt needed siphoning from straw or liquid to lips to initiate sip. Her laryngeal closure was sustained during prolonged sips. No apparent weakness. However, there were instances of premature spillage with sensed penetration/aspiration and ejection. Pt started to gag when offered pudding and study was terminated. SLP continued diagnostic treatment after scope removed, see next note. for full recommendations  SLP Visit Diagnosis Dysphagia, oropharyngeal phase (R13.12)  Attention and concentration deficit following --  Frontal lobe and executive function deficit following --  Impact on safety and function Mild aspiration risk         03/11/2023   11:00 AM  Treatment Recommendations  Treatment Recommendations Therapy as outlined in treatment plan below        03/11/2023   11:00 AM  Prognosis  Prognosis for improved oropharyngeal function Fair  Barriers to Reach Goals Language deficits  Barriers/Prognosis Comment --       03/11/2023   11:00 AM  Diet Recommendations  SLP Diet Recommendations Dysphagia 3 (Mech soft) solids;Nectar thick liquid  Liquid Administration via Cup;Straw  Medication Administration Crushed with puree  Compensations Minimize environmental distractions  Postural Changes --         03/11/2023   11:00 AM  Other  Recommendations  Recommended Consults --  Oral Care Recommendations Oral care BID  Caregiver Recommendations Have oral suction available  Follow Up Recommendations  Acute inpatient rehab (3hours/day)  Assistance recommended at discharge --  Functional Status Assessment --       03/11/2023   11:00 AM  Frequency and Duration   Speech Therapy Frequency (ACUTE ONLY) min 2x/week  Treatment Duration 2 weeks         03/11/2023   11:00 AM  Oral Phase  Oral Phase Impaired  Oral - Pudding Teaspoon --  Oral - Pudding Cup --  Oral - Honey Teaspoon --  Oral - Honey Cup --  Oral - Nectar Teaspoon NT  Oral - Nectar Cup --  Oral - Nectar Straw --  Oral - Thin Teaspoon NT  Oral - Thin Cup Decreased bolus cohesion  Oral - Thin Straw Decreased bolus cohesion  Oral - Puree NT  Oral - Mech Soft --  Oral - Regular --  Oral - Multi-Consistency --  Oral - Pill --  Oral Phase - Comment --       03/11/2023   11:00 AM  Pharyngeal Phase  Pharyngeal Phase Impaired  Pharyngeal- Pudding Teaspoon --  Pharyngeal --  Pharyngeal- Pudding Cup --  Pharyngeal --  Pharyngeal- Honey Teaspoon --  Pharyngeal --  Pharyngeal- Honey Cup --  Pharyngeal --  Pharyngeal- Nectar Teaspoon --  Pharyngeal --  Pharyngeal- Nectar Cup --  Pharyngeal --  Pharyngeal- Nectar Straw --  Pharyngeal --  Pharyngeal- Thin Teaspoon --  Pharyngeal --  Pharyngeal- Thin Cup --  Pharyngeal --  Pharyngeal- Thin Straw Penetration/Aspiration before swallow;Trace aspiration  Pharyngeal Material enters airway, CONTACTS cords and then ejected out;Material does not enter airway  Pharyngeal- Puree --  Pharyngeal --  Pharyngeal- Mechanical Soft --  Pharyngeal --  Pharyngeal- Regular --  Pharyngeal --  Pharyngeal- Multi-consistency --  Pharyngeal --  Pharyngeal- Pill --  Pharyngeal --  Pharyngeal Comment --         No data to display           Adaline Trejos, Consuelo Fitch 03/11/2023, 12:43 PM

## 2023-03-11 NOTE — Progress Notes (Signed)
 PROGRESS NOTE    Jacqueline [name]  FMW:[id number] DOB: [birth year] DOA: 02/21/2023 PCP: Tonnie Raisin, MD    Chief Complaint  Patient presents with   Code Stroke    Brief Narrative:  [name] is a 23 y.o. 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  The night before presentation, she drank a significant amount of alcohol and took some Suboxone that was not prescribed to her.  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  She went to IR for mechanical thrombectomy, but this was unfortunately unsuccessful.  This morning, patient is noted to have continued expressive aphasia and right hemiparesis.  Patient had a significant episode of agitation early on in the hospitalization, with combativeness requiring use of restraints and initiation of Precedex  drip.  Per patient's boyfriend, she does not drink every day and does not typically use illicit drugs.  Patient subsequently weaned off Precedex  drip and transferred out to ICU.   NIH on Admission 3     SIGNIFICANT HOSPITAL EVENTS 12/20-patient admitted, mechanical thrombectomy attempted but unsuccessful 12/21-significant episode of agitation requiring restraints and Precedex  12/24-off precedex  12/25-continue intermittent fever and agitation, on Abx and resume precedex . Resumed heparin  IV 12/26-repeat MRI showed stroke extension.  12/30-still intermittent agitation, yelling, but distractable. On seroquel  and clonazepam .    Assessment & Plan:   Principal Problem:   Acute ischemic left MCA stroke (HCC) Active Problems:   Anemia   Middle cerebral artery embolism, left   Agitation   Urinary tract infection without hematuria   Lupus anticoagulant disorder (HCC)   Dysphagia   Vitamin B12 deficiency  #1 stroke: Left MCA territory infarct status post unsuccessful IR with extension, etiology: Likely related to  hypercoagulability from positive lupus anticoagulant not on anticoagulation -Patient noted on presentation with concerns for code stroke head CT done with left parietotemporal infarct noted. -Patient underwent CT angiogram head and neck that showed an occluded left proximal to mid M2 MCA. -CT perfusion 21 mm penumbra in the left parietotemporal region. -Patient seen in consultation by neurology and interventional neurology patient status post IR initially T1C13, but progressive worsening stenosis with subsequent complete closure of left M2.  Intermittent mild improvement in caliber with verapamil  and nitroglycerin . -MRI done with an acute left MCA territory infarct without hemorrhagic conversion. -Repeat head CT done showed extension of stroke in the anterior inferior right frontal lobe and right cerebellum. -Patient underwent repeat MRI which showed stroke extension but no hemorrhagic conversion. -Repeat CT head 12/28 showed stable large right MCA and small left MCA/ACA infarcts. -Repeat CT 12/30 showed expected evolutionary changes in the left posterior division left MCA and left ACA/MCA border zone infarcts. -2D echo with normal EF. -EEG done with encephalopathy but no epileptiform activity noted. -Fasting lipid panel with LDL of 42.  Statin currently not indicated.  - Hemoglobin A1c noted at 4.4. -Patient noted not to be on antithrombotic medications prior to admission and started on IV heparin  per neurology. -Neurology recommending continuation of IV heparin  until patient is able to swallow then switching to Eliquis . -Patient currently with cortrack and receiving tube feeds, PT OT following and recommending CIR.  2.  Agitation -Patient noted to have some intermittent episodes of agitation however per neurology noted to be distractible with conversation and working with PT and OT. -Patient noted to have required Precedex  drip which was subsequently weaned off initially placed back on Precedex   and  currently off Precedex . -Per significant other patient does not frequently drink or use illicit substances but still concern for withdrawal given alcohol and Suboxone use prior to admission. -Depakote  level noted at 42. -Was on Depakote  500 every 8 hours subsequently changed to 1000 mg twice daily and changed to 750 every 8 hours. -Noted to be on clonazepam  0.5 mg twice daily and increased to 1 mg twice daily. -Continue Seroquel  50 mg twice daily. -Patient still with bouts of agitation, neurology recommending increase Depakote  to 1000 mg 3 times daily. -Continue IV Ativan  0.5 mg every 8 hours as needed. -Follow.  3.  Positive lupus anticoagulant/history of PE -Patient noted to have unprovoked PE 04/2022 noted to have elevated PTT, DRV VT, hexagonal phase phospholipid and positive anticardiolipin IgM and was on Eliquis . -Noted to have followed up with hematology/oncology 07/2022. -Noted that patient stopped taking Eliquis  08/2022. -Anticardiolipin IgM still elevated during this hospitalization. -Per neurology, hematology recommending okay to start anticoagulation once platelet count is greater than 50. -Continue IV heparin . -Follow platelet count.  4.  Fever/UTI -Fever curve noted to have trended down. -Urinalysis done concerning for UTI. -Initial urine cultures with 50,000 colonies of E. coli. -Status post full course of IV Rocephin . -Repeat urine cultures with no growth to date. -Blood cultures negative to date. 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 -Outpatient follow-up when medically stable for positive RPR.  5.  BP -BP noted to be soft but improving. -Not on antihypertensive medications prior to admission. -Follow.  6.  Anemia/thrombocytopenia/B12 deficiency/iron  deficiency -Platelet count was improving but trending back down.  -Anemia panel concerning for iron  deficiency anemia.  Folate at 9.9.  Vitamin B12 of 122. -Status post IV iron . -Continue oral iron   supplementation and vitamin B12 supplementation. -Outpatient follow-up.  7.  Tobacco abuse -Patient noted to smoke cigars. -When more alert will need discussion about total tobacco cessation.  8.  Substance abuse -It is noted that patient uses Suboxone. -UDS negative. -Once more alert will need to discuss substance abuse and cessation with patient.  9.  Dysphagia -Cortrak initially placed pulled out, NG tube placed, cortrak placed back in currently on tube feeds at 55. -SLP following. -Patient started dysphagia 3 diet with nectar thick liquids. -Continue current tube feeds. -Hopefully can wean off tube feeds and transition to oral otherwise may need to consider PEG tube placement.     DVT prophylaxis: Heparin  Code Status: Full Family Communication: No family at bedside. Disposition: TBD  Status is: Inpatient Remains inpatient appropriate because: Severity of illness   Consultants:  PCCM: Dr. Arlinda 02/22/2023 CIR  Procedures:  CT head 02/23/2023, 02/26/2023, 03/01/2023, 03/03/2023 Abdominal films 03/03/2023, 02/28/2023, 02/26/2023, 02/24/2023 Chest x-ray 02/23/2023 MRI brain 02/22/2023, 02/27/2023 Lower extremity Dopplers 02/26/2023 CT angiogram head and neck 02/21/2023 CT head code stroke 02/21/2023 2D echo 02/24/2023 Emergent large vessel occlusion thrombolysis per Dr. Dolphus 02/21/2023 Cortrak placement 02/24/2023 EEG 02/27/2023  Antimicrobials: Anti-infectives (From admission, onward)    Start     Dose/Rate Route Frequency Ordered Stop   02/26/23 1100  cefTRIAXone  (ROCEPHIN ) 2 g in sodium chloride  0.9 % 100 mL IVPB        2 g 200 mL/hr over 30 Minutes Intravenous Every 24 hours 02/26/23 1002 03/01/23 1043   02/24/23 1730  cefTRIAXone  (ROCEPHIN ) 2 g in sodium chloride  0.9 % 100 mL IVPB  Status:  Discontinued        2 g 200 mL/hr over 30 Minutes Intravenous Every 24 hours 02/24/23 1633 02/25/23 0831  Subjective: Sleeping.  Opens eyes to  touch.  Aphasic.  Objective: Vitals:   03/10/23 2329 03/11/23 0317 03/11/23 0744 03/11/23 1209  BP: (!) 96/54 (!) 81/56 (!) 94/56 94/60  Pulse: 91 92 78   Resp: 18 19 18 18   Temp: 98.6 F (37 C) 98.3 F (36.8 C) 97.9 F (36.6 C) 98.1 F (36.7 C)  TempSrc: Oral Oral Oral Axillary  SpO2: 98% 98% 98% 100%  Weight:      Height:        Intake/Output Summary (Last 24 hours) at 03/11/2023 1413 Last data filed at 03/11/2023 0618 Gross per 24 hour  Intake 3248.8 ml  Output 900 ml  Net 2348.8 ml   Filed Weights   03/03/23 0500 03/04/23 0500 03/08/23 0500  Weight: 71.2 kg 75.7 kg 77.4 kg    Examination:  General exam: Sleeping.  Cortrak in nares.  Respiratory system: CTAB anterior lung fields.  No wheezes, no crackles, no rhonchi.  Fair air movement.   Cardiovascular system: RRR no murmurs rubs or gallops.  No JVD.  No lower extremity edema.  Gastrointestinal system: Abdomen is soft, nontender, nondistended, positive bowel sounds.  No rebound.  No guarding.  Central nervous system: Asleep. Extremities: Unable to assess strength.  Skin: No rashes, lesions or ulcers Psychiatry: Judgement and insight appear poor l. Mood & affect appropriate.     Data Reviewed: I have personally reviewed following labs and imaging studies  CBC: Recent Labs  Lab 03/07/23 0742 03/08/23 0634 03/09/23 0626 03/10/23 0623 03/11/23 0547  WBC 7.0 6.7 5.6 6.0 5.1  HGB 10.1* 9.5* 9.8* 9.6* 9.9*  HCT 31.0* 29.1* 29.7* 29.2* 29.8*  MCV 76.0* 76.0* 76.5* 77.5* 76.6*  PLT 98* 88* 84* 82* 79*    Basic Metabolic Panel: Recent Labs  Lab 03/06/23 0510 03/07/23 0742 03/08/23 0634 03/10/23 0623 03/11/23 0547  NA 135 137 134* 136 137  K 3.9 4.0 3.8 3.9 3.8  CL 102 105 103 101 103  CO2 22 24 19* 24 24  GLUCOSE 105* 104* 120* 96 100*  BUN 26* 25* 25* 27* 22*  CREATININE 0.91 0.84 0.74 0.73 0.68  CALCIUM  9.0 9.2 9.1 9.0 8.9    GFR: Estimated Creatinine Clearance: 109.2 mL/min (by C-G formula  based on SCr of 0.68 mg/dL).  Liver Function Tests: Recent Labs  Lab 03/08/23 0634 03/10/23 0623  AST 45* 34  ALT 44 31  ALKPHOS 79 76  BILITOT 0.4 0.4  PROT 7.5 7.3  ALBUMIN 2.9* 2.8*    CBG: Recent Labs  Lab 03/10/23 1954 03/10/23 2327 03/11/23 0317 03/11/23 0743 03/11/23 1207  GLUCAP 88 76 99 102* 113*     Recent Results (from the past 240 hours)  Urine Culture (for pregnant, neutropenic or urologic patients or patients with an indwelling urinary catheter)     Status: None   Collection Time: 03/03/23 10:28 AM   Specimen: Urine, Catheterized  Result Value Ref Range Status   Specimen Description URINE, CATHETERIZED  Final   Special Requests NONE  Final   Culture   Final    NO GROWTH Performed at [facility], [address], [place], [place] [address]    Report Status 03/04/2023 FINAL  Final         Radiology Studies: No results found.       Scheduled Meds:  Chlorhexidine  Gluconate Cloth  6 each Topical Daily   clonazepam   1 mg Oral Once   clonazePAM   1 mg Per Tube BID  vitamin B-12  2,000 mcg Per Tube Daily   feeding supplement (PROSource TF20)  60 mL Per Tube BID   folic acid   1 mg Per Tube Daily   free water   100 mL Per Tube Q4H   iron  polysaccharides  150 mg Per Tube Daily   multivitamin  15 mL Per Tube Daily   mouth rinse  15 mL Mouth Rinse 4 times per day   polyethylene glycol  17 g Per Tube Daily   QUEtiapine   50 mg Per Tube BID   thiamine   100 mg Per Tube Daily   valproic  acid  1,000 mg Per Tube TID   Continuous Infusions:  feeding supplement (OSMOLITE 1.5 CAL) 1,000 mL (03/09/23 0033)   heparin  1,150 Units/hr (03/11/23 1409)     LOS: 18 days    Time spent: 35 minutes    Toribio Hummer, MD [facility]   To contact the attending provider between 7A-7P or the covering provider during after hours 7P-7A, please log into the web site [email or web address] and access using universal [place] password for that web site.  If you do not have the password, please call the hospital operator.  03/11/2023, 2:13 PM

## 2023-03-11 NOTE — Progress Notes (Signed)
 Speech Language Pathology Treatment: Dysphagia;Cognitive-Linquistic  Patient Details Name: Jacqueline [name] MRN: [id number] DOB: 11-13-2000 Today's Date: 03/11/2023 Time: 1000-1040 SLP Time Calculation (min) (ACUTE ONLY): 40 min  Assessment / Plan / Recommendation Clinical Impression  Continued diagnostic treatment provided after scope removed as pt could not longer tolerate, but further decision making needed. Pt did have some sensed penetration/aspiration during FEES due to need for total assisted feeding and need for cueing for pt to sip resulting in premature spillage and timing impairment. SLP provided further trials of thin liquids via straw and cup to determine if practice would improve control. Pt continued to have intermittent coughing with thins. Though pt is protecting airway with cough, expect that frequent coughing may cause caregivers concern and limit her intake for safety. Trialed nectar thick liquids and pt was able to to drink these from a straw without coughing. She also accepted one bite of a graham cracker with good mastication. She has also been masticating ice. Pt would not accept further offers of solids. She focused her gaze on them and kept lips sealed or turned her head away. SLP used this as an opportunity to cue pt in Y/N responses. Could not elicit a head nod or shake or shape cries into meaningful verbalization in 4 opportunities. Pt does appear to be using her gaze with some control. Will attempt binary choices with eye gaze next session. Pt to initiate a dys 3 diet and nectar thick liquids. Mechanical soft is a liberal choice, but hope that pt may accept some foods from her partner if they are something she might really want (take out, food from home, etc). Very concerned that despite diet initiation quantity of oral intake will be limited.   HPI HPI: The pt is a 23 yo female presenting 12/20 with expressive aphasia. Work up for left MCA territory infarct s/p unsuccessful  attempt at mechanical thrombectomy. MRI showed fairly extensive acute left MCA territory and watershed territory infarcts within the left cerebral hemisphere as well as small acute R frontoparietal cortical/subcortical infarcts. Pt initially passed the Yale swallow screen but become more aphasic and started to cough with PO intake. 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      SLP Plan  Continue with current plan of care      Recommendations for follow up therapy are one component of a multi-disciplinary discharge planning process, led by the attending physician.  Recommendations may be updated based on patient status, additional functional criteria and insurance authorization.    Recommendations  Diet recommendations: Dysphagia 3 (mechanical soft);Nectar-thick liquid Liquids provided via: Cup;Straw Medication Administration: Crushed with puree Supervision: Full supervision/cueing for compensatory strategies Compensations: Minimize environmental distractions Postural Changes and/or Swallow Maneuvers: Seated upright 90 degrees                      Frequent or constant Supervision/Assistance Dysphagia, oropharyngeal phase (R13.12)     Continue with current plan of care     Myrical Andujo, Consuelo Fitch  03/11/2023, 12:47 PM

## 2023-03-12 DIAGNOSIS — I63512 Cerebral infarction due to unspecified occlusion or stenosis of left middle cerebral artery: Secondary | ICD-10-CM | POA: Diagnosis not present

## 2023-03-12 DIAGNOSIS — D649 Anemia, unspecified: Secondary | ICD-10-CM | POA: Diagnosis not present

## 2023-03-12 DIAGNOSIS — N3000 Acute cystitis without hematuria: Secondary | ICD-10-CM | POA: Diagnosis not present

## 2023-03-12 DIAGNOSIS — R451 Restlessness and agitation: Secondary | ICD-10-CM | POA: Diagnosis not present

## 2023-03-12 LAB — RENAL FUNCTION PANEL
Albumin: 2.7 g/dL — ABNORMAL LOW (ref 3.5–5.0)
Anion gap: 11 (ref 5–15)
BUN: 25 mg/dL — ABNORMAL HIGH (ref 6–20)
CO2: 24 mmol/L (ref 22–32)
Calcium: 8.8 mg/dL — ABNORMAL LOW (ref 8.9–10.3)
Chloride: 100 mmol/L (ref 98–111)
Creatinine, Ser: 0.83 mg/dL (ref 0.44–1.00)
GFR, Estimated: >60 mL/min (ref >60)
Glucose, Bld: 120 mg/dL — ABNORMAL HIGH (ref 70–99)
Phosphorus: 5.3 mg/dL — ABNORMAL HIGH (ref 2.5–4.6)
Potassium: 3.7 mmol/L (ref 3.5–5.1)
Sodium: 135 mmol/L (ref 135–145)

## 2023-03-12 LAB — CBC
HCT: 28.6 % — ABNORMAL LOW (ref 36.0–46.0)
Hemoglobin: 9.6 g/dL — ABNORMAL LOW (ref 12.0–15.0)
MCH: 26 pg (ref 26.0–34.0)
MCHC: 33.6 g/dL (ref 30.0–36.0)
MCV: 77.5 fL — ABNORMAL LOW (ref 80.0–100.0)
Platelets: 75 10*3/uL — ABNORMAL LOW (ref 150–400)
RBC: 3.69 MIL/uL — ABNORMAL LOW (ref 3.87–5.11)
RDW: 25.5 % — ABNORMAL HIGH (ref 11.5–15.5)
WBC: 4.6 10*3/uL (ref 4.0–10.5)
nRBC: 0 % (ref 0.0–0.2)

## 2023-03-12 LAB — GLUCOSE, CAPILLARY
Glucose-Capillary: 100 mg/dL — ABNORMAL HIGH (ref 70–99)
Glucose-Capillary: 104 mg/dL — ABNORMAL HIGH (ref 70–99)
Glucose-Capillary: 104 mg/dL — ABNORMAL HIGH (ref 70–99)
Glucose-Capillary: 110 mg/dL — ABNORMAL HIGH (ref 70–99)
Glucose-Capillary: 84 mg/dL (ref 70–99)
Glucose-Capillary: 99 mg/dL (ref 70–99)

## 2023-03-12 LAB — HEPARIN LEVEL (UNFRACTIONATED): Heparin Unfractionated: 0.45 [IU]/mL (ref 0.30–0.70)

## 2023-03-12 NOTE — Progress Notes (Signed)
 PROGRESS NOTE    Jacqueline [name]  FMW:[id number] DOB: [birth year] DOA: 02/21/2023 PCP: Tonnie Raisin, MD    Chief Complaint  Patient presents with   Code Stroke    Brief Narrative:  [name] is a 23 y.o. 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  The night before presentation, she drank a significant amount of alcohol and took some Suboxone that was not prescribed to her.  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  She went to IR for mechanical thrombectomy, but this was unfortunately unsuccessful.  This morning, patient is noted to have continued expressive aphasia and right hemiparesis.  Patient had a significant episode of agitation early on in the hospitalization, with combativeness requiring use of restraints and initiation of Precedex  drip.  Per patient's boyfriend, she does not drink every day and does not typically use illicit drugs.  Patient subsequently weaned off Precedex  drip and transferred out to ICU.   NIH on Admission 3     SIGNIFICANT HOSPITAL EVENTS 12/20-patient admitted, mechanical thrombectomy attempted but unsuccessful 12/21-significant episode of agitation requiring restraints and Precedex  12/24-off precedex  12/25-continue intermittent fever and agitation, on Abx and resume precedex . Resumed heparin  IV 12/26-repeat MRI showed stroke extension.  12/30-still intermittent agitation, yelling, but distractable. On seroquel  and clonazepam .    Assessment & Plan:   Principal Problem:   Acute ischemic left MCA stroke (HCC) Active Problems:   Anemia   Middle cerebral artery embolism, left   Agitation   Urinary tract infection without hematuria   Lupus anticoagulant disorder (HCC)   Dysphagia   Vitamin B12 deficiency  #1 stroke: Left MCA territory infarct status post unsuccessful IR with extension, etiology: Likely related to  hypercoagulability from positive lupus anticoagulant not on anticoagulation -Patient noted on presentation with concerns for code stroke head CT done with left parietotemporal infarct noted. -Patient underwent CT angiogram head and neck that showed an occluded left proximal to mid M2 MCA. -CT perfusion 21 mm penumbra in the left parietotemporal region. -Patient seen in consultation by neurology and interventional neurology patient status post IR initially T1C13, but progressive worsening stenosis with subsequent complete closure of left M2.  Intermittent mild improvement in caliber with verapamil  and nitroglycerin . -MRI done with an acute left MCA territory infarct without hemorrhagic conversion. -Repeat head CT done showed extension of stroke in the anterior inferior right frontal lobe and right cerebellum. -Patient underwent repeat MRI which showed stroke extension but no hemorrhagic conversion. -Repeat CT head 12/28 showed stable large right MCA and small left MCA/ACA infarcts. -Repeat CT 12/30 showed expected evolutionary changes in the left posterior division left MCA and left ACA/MCA border zone infarcts. -2D echo with normal EF. -EEG done with encephalopathy but no epileptiform activity noted. -Fasting lipid panel with LDL of 42.  Statin currently not indicated.  - Hemoglobin A1c noted at 4.4. -Patient noted not to be on antithrombotic medications prior to admission and started on IV heparin  per neurology. -Neurology recommending continuation of IV heparin  until patient is able to swallow then switching to Eliquis . -Patient currently with cortrak and receiving tube feeds, PT OT following and recommending CIR.  2.  Agitation -Patient noted to have some intermittent episodes of agitation however per neurology noted to be distractible with conversation and working with PT and OT. -Patient noted to have required Precedex  drip which was subsequently weaned off initially placed back on Precedex   and  currently off Precedex . -Per significant other patient does not frequently drink or use illicit substances but still concern for withdrawal given alcohol and Suboxone use prior to admission. -Depakote  level noted at 42. -Was on Depakote  500 every 8 hours >>>> Depakote  1000 mg twice daily >>>>> Depakote  750 mg every 8 hours >>>> Depakote  1000 mg every 8 hours.  -Noted to be on clonazepam  0.5 mg twice daily and increased to 1 mg twice daily. -Continue Seroquel  50 mg twice daily. -Continue IV Ativan  0.5 mg every 8 hours as needed. -IV Haldol  as needed. -Follow.  3.  Positive lupus anticoagulant/history of PE -Patient noted to have unprovoked PE 04/2022 noted to have elevated PTT, DRV VT, hexagonal phase phospholipid and positive anticardiolipin IgM and was on Eliquis . -Noted to have followed up with hematology/oncology 07/2022. -Noted that patient stopped taking Eliquis  08/2022. -Anticardiolipin IgM still elevated during this hospitalization. -Per neurology, hematology recommending okay to start anticoagulation once platelet count is greater than 50. -Continue IV heparin . -Follow platelet count.  4.  Fever/UTI -Fever curve noted to have trended down. -Urinalysis done concerning for UTI. -Initial urine cultures with 50,000 colonies of E. coli. -Status post full course of IV Rocephin . -Repeat urine cultures with no growth to date. -Blood cultures negative to date. 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 -Outpatient follow-up when medically stable for positive RPR.  5.  BP -BP noted to be soft . -Not on antihypertensive medications prior to admission.   -Follow.  6.  Anemia/thrombocytopenia/B12 deficiency/iron  deficiency -Platelet count was improving but trending back down.  -Anemia panel concerning for iron  deficiency anemia.  Folate at 9.9.  Vitamin B12 of 122. -Status post IV iron . -Continue oral iron  supplementation and vitamin B12 supplementation. -Outpatient  follow-up.  7.  Tobacco abuse -Patient noted to smoke cigars. -When more alert will need discussion about total tobacco cessation.  8.  Substance abuse -It is noted that patient uses Suboxone. -UDS negative. -Once more alert will need to discuss substance abuse and cessation with patient.  9.  Dysphagia -Cortrak initially placed pulled out, NG tube placed, cortrak placed back in currently on tube feeds at 55. -SLP following. -Patient started dysphagia 3 diet with nectar thick liquids. -Continue current tube feeds. -Hopefully can wean off tube feeds and transition to oral otherwise may need to consider PEG tube placement.     DVT prophylaxis: Heparin  Code Status: Full Family Communication: No family at bedside. Disposition: TBD  Status is: Inpatient Remains inpatient appropriate because: Severity of illness   Consultants:  PCCM: Dr. Arlinda 02/22/2023 CIR  Procedures:  CT head 02/23/2023, 02/26/2023, 03/01/2023, 03/03/2023 Abdominal films 03/03/2023, 02/28/2023, 02/26/2023, 02/24/2023 Chest x-ray 02/23/2023 MRI brain 02/22/2023, 02/27/2023 Lower extremity Dopplers 02/26/2023 CT angiogram head and neck 02/21/2023 CT head code stroke 02/21/2023 2D echo 02/24/2023 Emergent large vessel occlusion thrombolysis per Dr. Dolphus 02/21/2023 Cortrak placement 02/24/2023 EEG 02/27/2023  Antimicrobials: Anti-infectives (From admission, onward)    Start     Dose/Rate Route Frequency Ordered Stop   02/26/23 1100  cefTRIAXone  (ROCEPHIN ) 2 g in sodium chloride  0.9 % 100 mL IVPB        2 g 200 mL/hr over 30 Minutes Intravenous Every 24 hours 02/26/23 1002 03/01/23 1043   02/24/23 1730  cefTRIAXone  (ROCEPHIN ) 2 g in sodium chloride  0.9 % 100 mL IVPB  Status:  Discontinued        2 g 200 mL/hr over 30 Minutes Intravenous Every 24 hours 02/24/23 1633 02/25/23 0831  Subjective: Sleeping.  Opens eyes barely to touch.  Aphasia.   Objective: Vitals:   03/11/23 2025  03/11/23 2357 03/12/23 0440 03/12/23 0834  BP: 106/65 97/65 91/66  91/79  Pulse: (!) 104 95 87 100  Resp: 18 18 18 18   Temp: 98.6 F (37 C) 98.9 F (37.2 C) 98.5 F (36.9 C) 98.4 F (36.9 C)  TempSrc: Oral Oral Oral Oral  SpO2: 100% 100% 98% 100%  Weight:      Height:        Intake/Output Summary (Last 24 hours) at 03/12/2023 1234 Last data filed at 03/11/2023 1800 Gross per 24 hour  Intake 533.5 ml  Output 1100 ml  Net -566.5 ml   Filed Weights   03/03/23 0500 03/04/23 0500 03/08/23 0500  Weight: 71.2 kg 75.7 kg 77.4 kg    Examination:  General exam: Sleeping.  Cortrak in nares.  Respiratory system: Lungs clear to auscultation anterior lung fields.  Cardiovascular system: Regular rate rhythm no murmurs rubs or gallops.  No JVD.  No lower extremity edema.  Gastrointestinal system: Abdomen is soft, nontender, nondistended, positive bowel sounds.  No rebound.  No guarding.  Central nervous system: Asleep. Extremities: Unable to assess strength.  Skin: No rashes, lesions or ulcers Psychiatry: Judgement and insight appear poor. Mood & affect appropriate.     Data Reviewed: I have personally reviewed following labs and imaging studies  CBC: Recent Labs  Lab 03/08/23 0634 03/09/23 0626 03/10/23 0623 03/11/23 0547 03/12/23 0557  WBC 6.7 5.6 6.0 5.1 4.6  HGB 9.5* 9.8* 9.6* 9.9* 9.6*  HCT 29.1* 29.7* 29.2* 29.8* 28.6*  MCV 76.0* 76.5* 77.5* 76.6* 77.5*  PLT 88* 84* 82* 79* 75*    Basic Metabolic Panel: Recent Labs  Lab 03/07/23 0742 03/08/23 0634 03/10/23 0623 03/11/23 0547 03/12/23 0557  NA 137 134* 136 137 135  K 4.0 3.8 3.9 3.8 3.7  CL 105 103 101 103 100  CO2 24 19* 24 24 24   GLUCOSE 104* 120* 96 100* 120*  BUN 25* 25* 27* 22* 25*  CREATININE 0.84 0.74 0.73 0.68 0.83  CALCIUM  9.2 9.1 9.0 8.9 8.8*  PHOS  --   --   --   --  5.3*    GFR: Estimated Creatinine Clearance: 105.3 mL/min (by C-G formula based on SCr of 0.83 mg/dL).  Liver Function  Tests: Recent Labs  Lab 03/08/23 0634 03/10/23 0623 03/12/23 0557  AST 45* 34  --   ALT 44 31  --   ALKPHOS 79 76  --   BILITOT 0.4 0.4  --   PROT 7.5 7.3  --   ALBUMIN 2.9* 2.8* 2.7*    CBG: Recent Labs  Lab 03/11/23 1533 03/11/23 2026 03/11/23 2358 03/12/23 0440 03/12/23 0831  GLUCAP 113* 103* 99 104* 84     Recent Results (from the past 240 hours)  Urine Culture (for pregnant, neutropenic or urologic patients or patients with an indwelling urinary catheter)     Status: None   Collection Time: 03/03/23 10:28 AM   Specimen: Urine, Catheterized  Result Value Ref Range Status   Specimen Description URINE, CATHETERIZED  Final   Special Requests NONE  Final   Culture   Final    NO GROWTH Performed at [facility], [address], [place], [place] [address]    Report Status 03/04/2023 FINAL  Final         Radiology Studies: No results found.       Scheduled Meds:  Chlorhexidine  Gluconate Cloth  6 each Topical Daily   clonazepam   1 mg Oral Once   clonazePAM   1 mg Per Tube BID   vitamin B-12  2,000 mcg Per Tube Daily   feeding supplement (PROSource TF20)  60 mL Per Tube BID   folic acid   1 mg Per Tube Daily   free water   100 mL Per Tube Q4H   iron  polysaccharides  150 mg Per Tube Daily   multivitamin  15 mL Per Tube Daily   mouth rinse  15 mL Mouth Rinse 4 times per day   polyethylene glycol  17 g Per Tube Daily   QUEtiapine   50 mg Per Tube BID   thiamine   100 mg Per Tube Daily   valproic  acid  1,000 mg Per Tube TID   Continuous Infusions:  feeding supplement (OSMOLITE 1.5 CAL) 1,000 mL (03/12/23 0924)   heparin  1,150 Units/hr (03/11/23 1409)     LOS: 19 days    Time spent: 35 minutes    Toribio Hummer, MD [facility]   To contact the attending provider between 7A-7P or the covering provider during after hours 7P-7A, please log into the web site [email or web address] and access using universal [place] password for that web site.  If you do not have the password, please call the hospital operator.  03/12/2023, 12:34 PM

## 2023-03-12 NOTE — Progress Notes (Signed)
 PHARMACY - ANTICOAGULATION CONSULT NOTE  Pharmacy Consult:  Heparin  Indication: Lupus AC and endoluminal thrombus   No Known Allergies  Patient Measurements: Height: 5' 4 (162.6 cm) Weight: 77.4 kg (170 lb 10.2 oz) IBW/kg (Calculated) : 54.7 Heparin  Dosing Weight: 72 kg  Vital Signs: Temp: 98.4 F (36.9 C) (01/08 0834) Temp Source: Oral (01/08 0834) BP: 91/79 (01/08 0834) Pulse Rate: 100 (01/08 0834)  Labs: Recent Labs    03/10/23 0623 03/11/23 0547 03/12/23 0557  HGB 9.6* 9.9* 9.6*  HCT 29.2* 29.8* 28.6*  PLT 82* 79* 75*  HEPARINUNFRC 0.35 0.38 0.45  CREATININE 0.73 0.68 0.83    Estimated Creatinine Clearance: 105.3 mL/min (by C-G formula based on SCr of 0.83 mg/dL).   Medical History: Past Medical History:  Diagnosis Date   Bacterial vaginitis 07/10/2016   Candida vaginitis 07/24/2016   Headache in pregnancy, antepartum, third trimester 07/24/2016   Kell isoimmunization during pregnancy     Assessment: 22 YOF presented with acute CVA and MRI also shows endoluminal thrombus.  Patient has a history of unprovoked PE (04/2022) treated with Eliquis  and no longer on anticoagulation. Hx of thrombocytopenia. Pharmacy consulted to dose heparin .  Heparin  level therapeutic at 0.45. Plt remains >50k. Hgb 9-10s. NOAC once can take PO.  Goal of Therapy:  Heparin  level 0.3-0.5 units/ml Monitor platelets by anticoagulation protocol: Yes   Plan:  Continue heparin  gtt at 1150 units/hr Daily heparin  level, CBC, s/s bleeding F/U ability to tolerate po  Sergio Batch, PharmD, [place], AAHIVP, CPP Infectious Disease Pharmacist 03/12/2023 10:38 AM

## 2023-03-12 NOTE — Progress Notes (Signed)
 SLP Cancellation Note  Patient Details Name: Jacqueline [name] MRN: [id number] DOB: 07/26/2000   Cancelled treatment:       Reason Eval/Treat Not Completed: Other (comment). Pt still in bed, no food on tray, just nectar thick drink. Talked to Tay about rationale for nectar thick and ok to offer pt foods of choice if she is alert and upright. RN currently giving meds. Will f/u.    Jaselyn Nahm, Consuelo Fitch 03/12/2023, 9:47 AM

## 2023-03-13 DIAGNOSIS — D649 Anemia, unspecified: Secondary | ICD-10-CM | POA: Diagnosis not present

## 2023-03-13 DIAGNOSIS — R451 Restlessness and agitation: Secondary | ICD-10-CM | POA: Diagnosis not present

## 2023-03-13 DIAGNOSIS — N3000 Acute cystitis without hematuria: Secondary | ICD-10-CM | POA: Diagnosis not present

## 2023-03-13 DIAGNOSIS — I63512 Cerebral infarction due to unspecified occlusion or stenosis of left middle cerebral artery: Secondary | ICD-10-CM | POA: Diagnosis not present

## 2023-03-13 LAB — CBC
HCT: 28.6 % — ABNORMAL LOW (ref 36.0–46.0)
Hemoglobin: 9.4 g/dL — ABNORMAL LOW (ref 12.0–15.0)
MCH: 26.1 pg (ref 26.0–34.0)
MCHC: 32.9 g/dL (ref 30.0–36.0)
MCV: 79.4 fL — ABNORMAL LOW (ref 80.0–100.0)
Platelets: 78 10*3/uL — ABNORMAL LOW (ref 150–400)
RBC: 3.6 MIL/uL — ABNORMAL LOW (ref 3.87–5.11)
RDW: 25.7 % — ABNORMAL HIGH (ref 11.5–15.5)
WBC: 5.3 10*3/uL (ref 4.0–10.5)
nRBC: 0 % (ref 0.0–0.2)

## 2023-03-13 LAB — GLUCOSE, CAPILLARY
Glucose-Capillary: 103 mg/dL — ABNORMAL HIGH (ref 70–99)
Glucose-Capillary: 111 mg/dL — ABNORMAL HIGH (ref 70–99)
Glucose-Capillary: 112 mg/dL — ABNORMAL HIGH (ref 70–99)
Glucose-Capillary: 95 mg/dL (ref 70–99)
Glucose-Capillary: 97 mg/dL (ref 70–99)
Glucose-Capillary: 98 mg/dL (ref 70–99)

## 2023-03-13 LAB — BASIC METABOLIC PANEL
Anion gap: 10 (ref 5–15)
BUN: 24 mg/dL — ABNORMAL HIGH (ref 6–20)
CO2: 21 mmol/L — ABNORMAL LOW (ref 22–32)
Calcium: 8.9 mg/dL (ref 8.9–10.3)
Chloride: 105 mmol/L (ref 98–111)
Creatinine, Ser: 0.66 mg/dL (ref 0.44–1.00)
GFR, Estimated: >60 mL/min (ref >60)
Glucose, Bld: 96 mg/dL (ref 70–99)
Potassium: 3.6 mmol/L (ref 3.5–5.1)
Sodium: 136 mmol/L (ref 135–145)

## 2023-03-13 LAB — HEPARIN LEVEL (UNFRACTIONATED): Heparin Unfractionated: 0.42 [IU]/mL (ref 0.30–0.70)

## 2023-03-13 MED ORDER — LORAZEPAM 2 MG/ML IJ SOLN
0.5000 mg | Freq: Three times a day (TID) | INTRAMUSCULAR | Status: DC | PRN
  Administered 2023-03-13: 0.5 mg via INTRAVENOUS
  Filled 2023-03-13: qty 1

## 2023-03-13 MED ORDER — QUETIAPINE FUMARATE 25 MG PO TABS
25.0000 mg | ORAL_TABLET | Freq: Two times a day (BID) | ORAL | Status: DC
  Administered 2023-03-13 – 2023-03-14 (×2): 25 mg
  Filled 2023-03-13 (×2): qty 1

## 2023-03-13 MED ORDER — CLONAZEPAM 0.5 MG PO TABS
0.5000 mg | ORAL_TABLET | Freq: Two times a day (BID) | ORAL | Status: DC
  Administered 2023-03-13 – 2023-03-18 (×10): 0.5 mg
  Filled 2023-03-13 (×10): qty 1

## 2023-03-13 NOTE — Progress Notes (Signed)
 PROGRESS NOTE    Jacqueline [name]  FMW:[id number] DOB: 03-Feb-2001 DOA: 02/21/2023 PCP: Tonnie Raisin, MD    Chief Complaint  Patient presents with   Code Stroke    Brief Narrative:  Jacqueline [name] is a 23 y.o. 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  The night before presentation, she drank a significant amount of alcohol and took some Suboxone that was not prescribed to her.  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  She went to IR for mechanical thrombectomy, but this was unfortunately unsuccessful.  This morning, patient is noted to have continued expressive aphasia and right hemiparesis.  Patient had a significant episode of agitation early on in the hospitalization, with combativeness requiring use of restraints and initiation of Precedex  drip.  Per patient's boyfriend, she does not drink every day and does not typically use illicit drugs.  Patient subsequently weaned off Precedex  drip and transferred out to ICU.   NIH on Admission 3     SIGNIFICANT HOSPITAL EVENTS 12/20-patient admitted, mechanical thrombectomy attempted but unsuccessful 12/21-significant episode of agitation requiring restraints and Precedex  12/24-off precedex  12/25-continue intermittent fever and agitation, on Abx and resume precedex . Resumed heparin  IV 12/26-repeat MRI showed stroke extension.  12/30-still intermittent agitation, yelling, but distractable. On seroquel  and clonazepam .    Assessment & Plan:   Principal Problem:   Acute ischemic left MCA stroke (HCC) Active Problems:   Anemia   Middle cerebral artery embolism, left   Agitation   Urinary tract infection without hematuria   Lupus anticoagulant disorder (HCC)   Dysphagia   Vitamin B12 deficiency  #1 stroke: Left MCA territory infarct status post unsuccessful IR with extension, etiology: Likely related to  hypercoagulability from positive lupus anticoagulant not on anticoagulation -Patient noted on presentation with concerns for code stroke head CT done with left parietotemporal infarct noted. -Patient underwent CT angiogram head and neck that showed an occluded left proximal to mid M2 MCA. -CT perfusion 21 mm penumbra in the left parietotemporal region. -Patient seen in consultation by neurology and interventional neurology patient status post IR initially T1C13, but progressive worsening stenosis with subsequent complete closure of left M2.  Intermittent mild improvement in caliber with verapamil  and nitroglycerin . -MRI done with an acute left MCA territory infarct without hemorrhagic conversion. -Repeat head CT done showed extension of stroke in the anterior inferior right frontal lobe and right cerebellum. -Patient underwent repeat MRI which showed stroke extension but no hemorrhagic conversion. -Repeat CT head 12/28 showed stable large right MCA and small left MCA/ACA infarcts. -Repeat CT 12/30 showed expected evolutionary changes in the left posterior division left MCA and left ACA/MCA border zone infarcts. -2D echo with normal EF. -EEG done with encephalopathy but no epileptiform activity noted. -Fasting lipid panel with LDL of 42.  Statin currently not indicated.  - Hemoglobin A1c noted at 4.4. -Patient noted not to be on antithrombotic medications prior to admission and started on IV heparin  per neurology. -Neurology recommending continuation of IV heparin  until patient is able to swallow then switching to Eliquis . -Patient currently with cortrak and receiving tube feeds, PT OT following and recommending CIR. -Due to increased somnolence/sleeping will decrease Seroquel  to 25 mg twice daily, decrease clonazepam  to 0.5 mg twice daily.  May need to decrease doses of as needed Ativan  and Haldol . -Follow.  2.  Agitation -Patient noted to have some intermittent episodes of agitation however per  neurology noted to be distractible with conversation and working with PT and OT. -Patient noted to have required Precedex  drip which was subsequently weaned off initially placed back on Precedex  and currently off Precedex . -Per significant other patient does not frequently drink or use illicit substances but still concern for withdrawal given alcohol and Suboxone use prior to admission. -Depakote  level noted at 42. -Was on Depakote  500 every 8 hours >>>> Depakote  1000 mg twice daily >>>>> Depakote  750 mg every 8 hours >>>> Depakote  1000 mg every 8 hours.  -Noted to be on clonazepam  0.5 mg twice daily and increased to 1 mg twice daily. -Currently on Seroquel  50 mg twice daily. -Continue IV Ativan  0.5 mg every 8 hours as needed. -IV Haldol  as needed. -Patient seems to be sleeping and as such unable to work with therapy on a daily basis. -Agitation seems to be improving and as such we will decrease Seroquel  to 25 mg twice daily, decrease clonazepam  down to 0.5 mg twice daily. -May need to decrease dose of as needed Haldol  and Ativan . -Follow.  3.  Positive lupus anticoagulant/history of PE -Patient noted to have unprovoked PE 04/2022 noted to have elevated PTT, DRV VT, hexagonal phase phospholipid and positive anticardiolipin IgM and was on Eliquis . -Noted to have followed up with hematology/oncology 07/2022. -Noted that patient stopped taking Eliquis  08/2022. -Anticardiolipin IgM still elevated during this hospitalization. -Per neurology, hematology recommending okay to start anticoagulation once platelet count is greater than 50. -Continue IV heparin . -Follow platelet count.  4.  Fever/UTI -Fever curve trending down.  -Urinalysis done concerning for UTI. -Initial urine cultures with 50,000 colonies of E. coli. -Status post full course of IV Rocephin . -Repeat urine cultures with no growth to date. -Blood cultures negative to date. 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 -Outpatient  follow-up when medically stable for positive RPR.  5.  BP -BP noted to be soft . -Not on antihypertensive medications prior to admission.   -Follow.  6.  Anemia/thrombocytopenia/B12 deficiency/iron  deficiency -Platelet count was improving but trending back down.  -Anemia panel concerning for iron  deficiency anemia.  Folate at 9.9.  Vitamin B12 of 122. -Status post IV iron . -Continue oral iron  supplementation and vitamin B12 supplementation. -Outpatient follow-up.  7.  Tobacco abuse -Patient noted to smoke cigars. -When more alert will need discussion about total tobacco cessation.  8.  Substance abuse -It is noted that patient uses Suboxone. -UDS negative. -Once more alert will need to discuss substance abuse and cessation with patient.  9.  Dysphagia -Cortrak initially placed pulled out, NG tube placed, cortrak placed back in currently on tube feeds at 55. -SLP following. -Patient started dysphagia 3 diet with nectar thick liquids. -Continue current tube feeds. -Hopefully can wean off tube feeds and transition to oral otherwise may need to consider PEG tube placement. -Calorie count.     DVT prophylaxis: Heparin  Code Status: Full Family Communication: No family at bedside. Disposition: TBD  Status is: Inpatient Remains inpatient appropriate because: Severity of illness   Consultants:  PCCM: Dr. Arlinda 02/22/2023 CIR  Procedures:  CT head 02/23/2023, 02/26/2023, 03/01/2023, 03/03/2023 Abdominal films 03/03/2023, 02/28/2023, 02/26/2023, 02/24/2023 Chest x-ray 02/23/2023 MRI brain 02/22/2023, 02/27/2023 Lower extremity Dopplers 02/26/2023 CT angiogram head and neck 02/21/2023 CT head code stroke 02/21/2023 2D echo 02/24/2023 Emergent large vessel occlusion thrombolysis per Dr. Dolphus 02/21/2023 Cortrak placement 02/24/2023 EEG 02/27/2023  Antimicrobials: Anti-infectives (From admission, onward)    Start     Dose/Rate Route Frequency Ordered Stop  02/26/23 1100  cefTRIAXone  (ROCEPHIN ) 2 g in sodium chloride  0.9 % 100 mL IVPB        2 g 200 mL/hr over 30 Minutes Intravenous Every 24 hours 02/26/23 1002 03/01/23 1043   02/24/23 1730  cefTRIAXone  (ROCEPHIN ) 2 g in sodium chloride  0.9 % 100 mL IVPB  Status:  Discontinued        2 g 200 mL/hr over 30 Minutes Intravenous Every 24 hours 02/24/23 1633 02/25/23 0831         Subjective: Patient sleeping.  Boyfriend at bedside.  Boyfriend states patient was up all night and as such sleeping this morning.  Per boyfriend patient less agitated and crying out less.  Per boyfriend patient tolerating current diet however eating about 20 to 25% of meals however drinking liquids well.  Aphasic.  Objective: Vitals:   03/12/23 2307 03/13/23 0330 03/13/23 0723 03/13/23 0800  BP: 100/69 109/67 (!) 91/55   Pulse: 87 100 84   Resp: 18 18 16    Temp: 98.1 F (36.7 C) 98.4 F (36.9 C) 98.2 F (36.8 C)   TempSrc: Axillary Axillary Oral   SpO2: 100% 100% 99%   Weight:    62.1 kg  Height:        Intake/Output Summary (Last 24 hours) at 03/13/2023 0942 Last data filed at 03/12/2023 2010 Gross per 24 hour  Intake 100 ml  Output 850 ml  Net -750 ml   Filed Weights   03/04/23 0500 03/08/23 0500 03/13/23 0800  Weight: 75.7 kg 77.4 kg 62.1 kg    Examination:  General exam: Sleeping.  Cortrak in nares.  Respiratory system: CTAB anterior lung fields.  Cardiovascular system: RRR no murmurs rubs or gallops.  No JVD.  No lower extremity edema. Gastrointestinal system: Abdomen is soft, nontender, nondistended, positive bowel sounds.  No rebound.  No guarding.  Central nervous system: Asleep. Extremities: Unable to assess strength.  Skin: No rashes, lesions or ulcers Psychiatry: Judgement and insight appear poor. Mood & affect appropriate.     Data Reviewed: I have personally reviewed following labs and imaging studies  CBC: Recent Labs  Lab 03/09/23 0626 03/10/23 0623 03/11/23 0547  03/12/23 0557 03/13/23 0739  WBC 5.6 6.0 5.1 4.6 5.3  HGB 9.8* 9.6* 9.9* 9.6* 9.4*  HCT 29.7* 29.2* 29.8* 28.6* 28.6*  MCV 76.5* 77.5* 76.6* 77.5* 79.4*  PLT 84* 82* 79* 75* 78*    Basic Metabolic Panel: Recent Labs  Lab 03/07/23 0742 03/08/23 0634 03/10/23 0623 03/11/23 0547 03/12/23 0557  NA 137 134* 136 137 135  K 4.0 3.8 3.9 3.8 3.7  CL 105 103 101 103 100  CO2 24 19* 24 24 24   GLUCOSE 104* 120* 96 100* 120*  BUN 25* 25* 27* 22* 25*  CREATININE 0.84 0.74 0.73 0.68 0.83  CALCIUM  9.2 9.1 9.0 8.9 8.8*  PHOS  --   --   --   --  5.3*    GFR: Estimated Creatinine Clearance: 90.3 mL/min (by C-G formula based on SCr of 0.83 mg/dL).  Liver Function Tests: Recent Labs  Lab 03/08/23 0634 03/10/23 0623 03/12/23 0557  AST 45* 34  --   ALT 44 31  --   ALKPHOS 79 76  --   BILITOT 0.4 0.4  --   PROT 7.5 7.3  --   ALBUMIN 2.9* 2.8* 2.7*    CBG: Recent Labs  Lab 03/12/23 1543 03/12/23 2007 03/13/23 0002 03/13/23 0319 03/13/23 0720  GLUCAP 100* 110* 98 95 111*  Recent Results (from the past 240 hours)  Urine Culture (for pregnant, neutropenic or urologic patients or patients with an indwelling urinary catheter)     Status: None   Collection Time: 03/03/23 10:28 AM   Specimen: Urine, Catheterized  Result Value Ref Range Status   Specimen Description URINE, CATHETERIZED  Final   Special Requests NONE  Final   Culture   Final    NO GROWTH Performed at [facility], [address], [place], [place] [address]    Report Status 03/04/2023 FINAL  Final         Radiology Studies: No results found.       Scheduled Meds:  Chlorhexidine  Gluconate Cloth  6 each Topical Daily   clonazepam   1 mg Oral Once   clonazePAM   1 mg Per Tube BID   vitamin B-12  2,000 mcg Per Tube Daily   feeding supplement (PROSource TF20)  60 mL Per Tube BID   folic acid   1 mg Per Tube Daily   free water   100 mL Per Tube Q4H   iron  polysaccharides  150 mg Per Tube Daily    multivitamin  15 mL Per Tube Daily   mouth rinse  15 mL Mouth Rinse 4 times per day   polyethylene glycol  17 g Per Tube Daily   QUEtiapine   50 mg Per Tube BID   thiamine   100 mg Per Tube Daily   valproic  acid  1,000 mg Per Tube TID   Continuous Infusions:  feeding supplement (OSMOLITE 1.5 CAL) 1,000 mL (03/12/23 0924)   heparin  1,150 Units/hr (03/11/23 1409)     LOS: 20 days    Time spent: 35 minutes    Toribio Hummer, MD [facility]   To contact the attending provider between 7A-7P or the covering provider during after hours 7P-7A, please log into the web site [email or web address] and access using universal [place] password for that web site. If you do not have the password, please call the hospital operator.  03/13/2023, 9:42 AM

## 2023-03-13 NOTE — Plan of Care (Addendum)
 Conts on heparin  gtt, TF w/cotrack. Awaiting placement. Calorie count began.   Problem: Education: Goal: Knowledge of disease or condition will improve 03/13/2023 1243 by Ross Jeb PENNER, RN Outcome: Not Met (add Reason) 03/13/2023 1243 by Ross Jeb PENNER, RN Outcome: Not Met (add Reason)   Problem: Ischemic Stroke/TIA Tissue Perfusion: Goal: Complications of ischemic stroke/TIA will be minimized 03/13/2023 1243 by Ross Jeb PENNER, RN Outcome: Not Met (add Reason) 03/13/2023 1243 by Ross Jeb PENNER, RN Outcome: Not Met (add Reason)   Problem: Health Behavior/Discharge Planning: Goal: Ability to manage health-related needs will improve 03/13/2023 1243 by Ross Jeb PENNER, RN Outcome: Not Met (add Reason) 03/13/2023 1243 by Ross Jeb PENNER, RN Outcome: Not Met (add Reason) Goal: Goals will be collaboratively established with patient/family 03/13/2023 1243 by Ross Jeb PENNER, RN Outcome: Not Met (add Reason) 03/13/2023 1243 by Ross Jeb PENNER, RN Outcome: Not Met (add Reason)   Problem: Nutrition: Goal: Risk of aspiration will decrease 03/13/2023 1243 by Ross Jeb PENNER, RN Outcome: Not Met (add Reason) 03/13/2023 1243 by Ross Jeb PENNER, RN Outcome: Not Met (add Reason) Goal: Dietary intake will improve 03/13/2023 1243 by Ross Jeb PENNER, RN Outcome: Not Met (add Reason) 03/13/2023 1243 by Ross Jeb PENNER, RN Outcome: Not Met (add Reason)   Problem: Activity: Goal: Risk for activity intolerance will decrease 03/13/2023 1243 by Ross Jeb PENNER, RN Outcome: Not Met (add Reason) 03/13/2023 1243 by Ross Jeb PENNER, RN Outcome: Not Met (add Reason)   Problem: Coping: Goal: Level of anxiety will decrease 03/13/2023 1243 by Ross Jeb PENNER, RN Outcome: Not Met (add Reason) 03/13/2023 1243 by Ross Jeb PENNER, RN Outcome: Not Met (add Reason)   Problem: Pain Management: Goal: General experience of comfort will improve 03/13/2023 1243 by Ross Jeb PENNER,  RN Outcome: Not Met (add Reason) 03/13/2023 1243 by Ross Jeb PENNER, RN Outcome: Not Met (add Reason)   Problem: Safety: Goal: Ability to remain free from injury will improve 03/13/2023 1243 by Ross Jeb PENNER, RN Outcome: Not Met (add Reason) 03/13/2023 1243 by Ross Jeb PENNER, RN Outcome: Not Met (add Reason)   Problem: Skin Integrity: Goal: Risk for impaired skin integrity will decrease 03/13/2023 1243 by Ross Jeb PENNER, RN Outcome: Not Met (add Reason) 03/13/2023 1243 by Ross Jeb PENNER, RN Outcome: Not Met (add Reason)   Problem: Health Behavior/Discharge Planning: Goal: Ability to safely manage health-related needs after discharge will improve 03/13/2023 1243 by Ross Jeb PENNER, RN Outcome: Not Met (add Reason) 03/13/2023 1243 by Ross Jeb PENNER, RN Outcome: Not Met (add Reason)   Problem: Cardiovascular: Goal: Ability to achieve and maintain adequate cardiovascular perfusion will improve 03/13/2023 1243 by Ross Jeb PENNER, RN Outcome: Not Met (add Reason) 03/13/2023 1243 by Ross Jeb PENNER, RN Outcome: Not Met (add Reason)

## 2023-03-13 NOTE — Progress Notes (Signed)
 Physical Therapy Treatment Patient Details Name: Jacqueline [name] MRN: [id number] DOB: 17-Sep-2000 Today's Date: 03/13/2023   History of Present Illness The pt is a 23 yo female presenting 12/20 with expressive aphasia. 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    PT Comments  Pt with max lethargy on arrival, opening eyes slightly throughout session, but unable to maintain alertness for active participation in session, suspect due to medication. Pt needing total A for all be mobility and to come to sitting EOB and to maintain sitting balance at EOB. Pt unable to follow commands to lift head in sitting as pt resting with neck in cervical flexion. Anticipate improved participation once more alert. Pt continues to benefit from skilled PT services to progress toward functional mobility goals.     If plan is discharge home, recommend the following: Two people to help with walking and/or transfers;Two people to help with bathing/dressing/bathroom;Assistance with cooking/housework;Assistance with feeding;Direct supervision/assist for medications management;Direct supervision/assist for financial management;Assist for transportation;Help with stairs or ramp for entrance;Supervision due to cognitive status   Can travel by private vehicle        Equipment Recommendations  Other (comment) (TBA)    Recommendations for Other Services       Precautions / Restrictions Precautions Precautions: Fall Precaution Comments: cortrak Restrictions Weight Bearing Restrictions Per Provider Order: No     Mobility  Bed Mobility Overal bed mobility: Needs Assistance Bed Mobility: Sit to Supine, Supine to Sit, Rolling Rolling: Total assist   Supine to sit: Total assist, HOB elevated Sit to supine: Total assist, HOB elevated   General bed mobility  comments: pt sleeping on arrival; briefly opens eyes; assisted to EOB to incr alertness without sucess with pt unable to hold up head, total A to roll R/L for bed change and peri-care    Transfers                   General transfer comment: unsafe to attempt    Ambulation/Gait               General Gait Details: NT   Stairs             Wheelchair Mobility     Tilt Bed    Modified Rankin (Stroke Patients Only) Modified Rankin (Stroke Patients Only) Pre-Morbid Rankin Score: No symptoms Modified Rankin: Severe disability     Balance Overall balance assessment: Needs assistance Sitting-balance support: Feet supported Sitting balance-Leahy Scale: Zero Sitting balance - Comments: max A to total for sitting balance today leaning to R; obtunded with periods of eye opening                                    Cognition Arousal: Suspect due to medications, Lethargic, Obtunded Behavior During Therapy: Flat affect, Restless Overall Cognitive Status: Difficult to assess Area of Impairment: Attention, Following commands                   Current Attention Level: Focused           General Comments: pt limited by arousal, able to open eyes briefly but unable to sustain atttention or track this PTA, pt unable to follow commands        Exercises Other Exercises Other Exercises: no active movement Rt  side noted, LUE appeared to be posturing and stiff into flexion    General Comments        Pertinent Vitals/Pain Pain Assessment Pain Assessment: Faces Faces Pain Scale: No hurt Pain Intervention(s): Monitored during session    Home Living                          Prior Function            PT Goals (current goals can now be found in the care plan section) Acute Rehab PT Goals PT Goal Formulation: Patient unable to participate in goal setting Time For Goal Achievement: 03/24/23 Progress towards PT goals: Not  progressing toward goals - comment (level or arousal)    Frequency    Min 1X/week      PT Plan      Co-evaluation              AM-PAC PT 6 Clicks Mobility   Outcome Measure  Help needed turning from your back to your side while in a flat bed without using bedrails?: Total Help needed moving from lying on your back to sitting on the side of a flat bed without using bedrails?: Total Help needed moving to and from a bed to a chair (including a wheelchair)?: Total Help needed standing up from a chair using your arms (e.g., wheelchair or bedside chair)?: Total Help needed to walk in hospital room?: Total Help needed climbing 3-5 steps with a railing? : Total 6 Click Score: 6    End of Session   Activity Tolerance: Patient limited by lethargy Patient left: with call bell/phone within reach;in bed;with bed alarm set;with nursing/sitter in room Nurse Communication: Mobility status PT Visit Diagnosis: Other abnormalities of gait and mobility (R26.89);Hemiplegia and hemiparesis;Other symptoms and signs involving the nervous system (R29.898) Hemiplegia - Right/Left: Right Hemiplegia - dominant/non-dominant: Dominant Hemiplegia - caused by: Cerebral infarction     Time: 8557-8488 PT Time Calculation (min) (ACUTE ONLY): 29 min  Charges:    $Therapeutic Activity: 23-37 mins PT General Charges $$ ACUTE PT VISIT: 1 Visit                     Hisao Doo R. PTA Acute Rehabilitation Services Office: [phone]   Therisa CHRISTELLA Boor 03/13/2023, 4:15 PM

## 2023-03-13 NOTE — Progress Notes (Signed)
 PHARMACY - ANTICOAGULATION CONSULT NOTE  Pharmacy Consult:  Heparin  Indication: Lupus AC and endoluminal thrombus   No Known Allergies  Patient Measurements: Height: 5' 4 (162.6 cm) Weight: 62.1 kg (136 lb 14.5 oz) IBW/kg (Calculated) : 54.7 Heparin  Dosing Weight: 72 kg  Vital Signs: Temp: 98.4 F (36.9 C) (01/09 1228) Temp Source: Oral (01/09 1228) BP: 92/64 (01/09 1228) Pulse Rate: 87 (01/09 1228)  Labs: Recent Labs    03/11/23 0547 03/12/23 0557 03/13/23 0739 03/13/23 0928  HGB 9.9* 9.6* 9.4*  --   HCT 29.8* 28.6* 28.6*  --   PLT 79* 75* 78*  --   HEPARINUNFRC 0.38 0.45 0.42  --   CREATININE 0.68 0.83  --  0.66    Estimated Creatinine Clearance: 93.6 mL/min (by C-G formula based on SCr of 0.66 mg/dL).   Medical History: Past Medical History:  Diagnosis Date   Bacterial vaginitis 07/10/2016   Candida vaginitis 07/24/2016   Headache in pregnancy, antepartum, third trimester 07/24/2016   Kell isoimmunization during pregnancy     Assessment: 22 YOF presented with acute CVA and MRI also shows endoluminal thrombus.  Patient has a history of unprovoked PE (04/2022) treated with Eliquis  and no longer on anticoagulation. Hx of thrombocytopenia. Pharmacy consulted to dose heparin .  Heparin  level continues to be therapeutic. Plt remains >50k. Hgb 9-10s. NOAC once can take PO.  Goal of Therapy:  Heparin  level 0.3-0.5 units/ml Monitor platelets by anticoagulation protocol: Yes   Plan:  Continue heparin  gtt at 1150 units/hr Daily heparin  level, CBC, s/s bleeding F/U ability to tolerate po  Sergio Batch, PharmD, [place], AAHIVP, CPP Infectious Disease Pharmacist 03/13/2023 12:48 PM

## 2023-03-13 NOTE — Progress Notes (Signed)
 Speech Language Pathology Treatment: Dysphagia  Patient Details Name: Jacqueline [name] MRN: [id number] DOB: 2000/12/11 Today's Date: 03/13/2023 Time: 1330-1350 SLP Time Calculation (min) (ACUTE ONLY): 20 min  Assessment / Plan / Recommendation Clinical Impression  Visited pt after arrival of lunch tray. RN reported pt had only had tramadol , but was still sleeping. SLP repositioned, washed faced and tried to wake pt. She opened her eyes and moved about some. SLP offered some bites and sips from her tray, but intake was minimal due to ongoing lethargy. Though she sipped from the straw and masticated some mashed potatoes and swallowed, she was never fully alert or attentive and meal was discontinued. Question if boyfriend is getting her to eat more at night. RN reported she ate a little at breakfast.    HPI HPI: The pt is a 23 yo female presenting 12/20 with expressive aphasia. 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 MRI showed fairly extensive acute left MCA territory and watershed territory infarcts within the left cerebral hemisphere as well as small acute R frontoparietal cortical/subcortical infarcts. Pt initially passed the Yale swallow screen but become more aphasic and started to cough with PO intake. 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      SLP Plan  Continue with current plan of care      Recommendations for follow up therapy are one component of a multi-disciplinary discharge planning process, led by the attending physician.  Recommendations may be updated based on patient status, additional functional criteria and insurance authorization.    Recommendations  Diet recommendations: Dysphagia 3 (mechanical soft);Thin liquid Liquids provided via: Cup;Straw Medication Administration: Crushed with puree Supervision: Full supervision/cueing for compensatory  strategies Compensations: Minimize environmental distractions Postural Changes and/or Swallow Maneuvers: Seated upright 90 degrees                  Oral care QID   Frequent or constant Supervision/Assistance Dysphagia, oropharyngeal phase (R13.12)     Continue with current plan of care     Matson Welch, Consuelo Fitch  03/13/2023, 2:53 PM

## 2023-03-13 NOTE — Progress Notes (Signed)
 SLP Cancellation Note  Patient Details Name: Jacqueline [name] MRN: [id number] DOB: 27-Sep-2000   Cancelled treatment:       Reason Eval/Treat Not Completed: Fatigue/lethargy limiting ability to participate. Pt asleep, discussed with MD. Pt has been eating with boyfriend, but unsure how well or how much. She is typically sedated during the day when therapy is present. Will f/u this pm and tomorrow to see if she is alert enough to observe with a meal. May need to talk to [facility] about recording how much she is eating.    Saxon Crosby, Consuelo Fitch 03/13/2023, 10:52 AM

## 2023-03-14 DIAGNOSIS — I63512 Cerebral infarction due to unspecified occlusion or stenosis of left middle cerebral artery: Secondary | ICD-10-CM | POA: Diagnosis not present

## 2023-03-14 LAB — GLUCOSE, CAPILLARY
Glucose-Capillary: 100 mg/dL — ABNORMAL HIGH (ref 70–99)
Glucose-Capillary: 101 mg/dL — ABNORMAL HIGH (ref 70–99)
Glucose-Capillary: 106 mg/dL — ABNORMAL HIGH (ref 70–99)
Glucose-Capillary: 110 mg/dL — ABNORMAL HIGH (ref 70–99)
Glucose-Capillary: 131 mg/dL — ABNORMAL HIGH (ref 70–99)
Glucose-Capillary: 94 mg/dL (ref 70–99)

## 2023-03-14 LAB — CBC
HCT: 30.2 % — ABNORMAL LOW (ref 36.0–46.0)
Hemoglobin: 10 g/dL — ABNORMAL LOW (ref 12.0–15.0)
MCH: 25.9 pg — ABNORMAL LOW (ref 26.0–34.0)
MCHC: 33.1 g/dL (ref 30.0–36.0)
MCV: 78.2 fL — ABNORMAL LOW (ref 80.0–100.0)
Platelets: 77 10*3/uL — ABNORMAL LOW (ref 150–400)
RBC: 3.86 MIL/uL — ABNORMAL LOW (ref 3.87–5.11)
RDW: 25 % — ABNORMAL HIGH (ref 11.5–15.5)
WBC: 4.5 10*3/uL (ref 4.0–10.5)
nRBC: 0 % (ref 0.0–0.2)

## 2023-03-14 LAB — BASIC METABOLIC PANEL
Anion gap: 10 (ref 5–15)
BUN: 22 mg/dL — ABNORMAL HIGH (ref 6–20)
CO2: 23 mmol/L (ref 22–32)
Calcium: 9.1 mg/dL (ref 8.9–10.3)
Chloride: 105 mmol/L (ref 98–111)
Creatinine, Ser: 0.62 mg/dL (ref 0.44–1.00)
GFR, Estimated: >60 mL/min (ref >60)
Glucose, Bld: 99 mg/dL (ref 70–99)
Potassium: 3.7 mmol/L (ref 3.5–5.1)
Sodium: 138 mmol/L (ref 135–145)

## 2023-03-14 LAB — HEPARIN LEVEL (UNFRACTIONATED): Heparin Unfractionated: 0.39 [IU]/mL (ref 0.30–0.70)

## 2023-03-14 MED ORDER — LOPERAMIDE HCL 2 MG PO CAPS
2.0000 mg | ORAL_CAPSULE | ORAL | Status: AC | PRN
  Administered 2023-03-14 – 2023-03-15 (×2): 2 mg via ORAL
  Filled 2023-03-14 (×2): qty 1

## 2023-03-14 MED ORDER — LORAZEPAM 2 MG/ML IJ SOLN
0.5000 mg | Freq: Four times a day (QID) | INTRAMUSCULAR | Status: DC | PRN
  Administered 2023-03-14 – 2023-03-18 (×9): 0.5 mg via INTRAVENOUS
  Filled 2023-03-14 (×10): qty 1

## 2023-03-14 MED ORDER — APIXABAN 5 MG PO TABS
5.0000 mg | ORAL_TABLET | Freq: Two times a day (BID) | ORAL | Status: DC
  Administered 2023-03-14 – 2023-03-18 (×10): 5 mg via ORAL
  Filled 2023-03-14 (×7): qty 1
  Filled 2023-03-14: qty 2
  Filled 2023-03-14 (×3): qty 1

## 2023-03-14 MED ORDER — HALOPERIDOL LACTATE 5 MG/ML IJ SOLN
2.0000 mg | Freq: Four times a day (QID) | INTRAMUSCULAR | Status: DC | PRN
  Administered 2023-03-14 – 2023-03-17 (×6): 2 mg via INTRAVENOUS
  Filled 2023-03-14 (×6): qty 1

## 2023-03-14 MED ORDER — ALUM & MAG HYDROXIDE-SIMETH 200-200-20 MG/5ML PO SUSP
30.0000 mL | Freq: Once | ORAL | Status: AC
  Administered 2023-03-14: 30 mL
  Filled 2023-03-14: qty 30

## 2023-03-14 NOTE — Progress Notes (Signed)
  Progress Note   Patient: Jacqueline [name] FMW:[id number] DOB: 12-01-2000 DOA: 02/21/2023     21 DOS: the patient was seen and examined on 03/14/2023 at 10:08AM      Brief hospital course: 23 y.o. F with hx thrombocytopenia, post-partum hemorrhage, PE and positive lupus AC lost to Hematology follow up who presented with stroke.  Night before admission, drank a significant amount of alcohol and took some Suboxone that was not prescribed to her.    In the morning, had expressive aphasia and was brought to ER.  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  She went to IR for mechanical thrombectomy, but this was unfortunately unsuccessful.  12/21 to 12/24 patient required Precedex .  12/26 MRI brain showed extension of hemorrhage.     Hospitalization complicated by persistent difficulty with agitation and aphasia.     Assessment and Plan: Acute ischemic stroke - PT/OT   Agitation - Continue clonazepam  taper - Stop Depakote  - Stop Haldol , Ativan , and Seroquel   Lupus anticoagulant History of pulmonary embolism - Stop heparin  - Start Eliquis  -Follow-up with rheumatology  UTI Resolved  Anemia Thrombocytopenia Stable - Outpatient hematology and rheumatology follow up       Subjective: No change, no nursing concerns.     Physical Exam: BP 95/75 (BP Location: Left Arm)   Pulse 99   Temp 98.5 F (36.9 C)   Resp 20   Ht 5' 4 (1.626 m)   Wt 76.5 kg   SpO2 100%   BMI 28.95 kg/m   General: Patient sleeping, doesn't rouse well to touch.  Partner says she is sleeping, oriented when awake.    Cardiovascular: RRR, nl S1-S2, no murmurs appreciated.   No LE edema.   Respiratory: Normal respiratory rate and rhythm.  CTAB without rales or wheezes. Abdominal: Abdomen soft without grimace to palpation.  No distension or HSM.   Neuro/Psych: Unable to assess   Data Reviewed: Basic metabolic panel unremarkable CBC shows stable thrombocytopenia, anemia  microcytic  Family Communication: Boyfriend at the bedside    Disposition: Status is: Inpatient         Author: Lonni SHAUNNA Dalton, MD 03/14/2023 2:17 PM  For on call review [email or web address].

## 2023-03-14 NOTE — Plan of Care (Addendum)
 Heparin  gtt dc'd, Eliquis  bridge. Conts on  TF w/cotrack, and calorie count. Awaiting placement.    Problem: Education: Goal: Knowledge of disease or condition will improve Outcome: Not Met (add Reason) Goal: Knowledge of secondary prevention will improve (MUST DOCUMENT ALL) Outcome: Not Met (add Reason) Goal: Knowledge of patient specific risk factors will improve Alonso N/A or DELETE if not current risk factor) Outcome: Not Met (add Reason)   Problem: Ischemic Stroke/TIA Tissue Perfusion: Goal: Complications of ischemic stroke/TIA will be minimized Outcome: Not Met (add Reason)   Problem: Coping: Goal: Will verbalize positive feelings about self Outcome: Not Met (add Reason) Goal: Will identify appropriate support needs Outcome: Not Met (add Reason)   Problem: Self-Care: Goal: Ability to participate in self-care as condition permits will improve Outcome: Not Met (add Reason) Goal: Verbalization of feelings and concerns over difficulty with self-care will improve Outcome: Not Met (add Reason) Goal: Ability to communicate needs accurately will improve Outcome: Not Met (add Reason)   Problem: Nutrition: Goal: Risk of aspiration will decrease Outcome: Not Met (add Reason) Goal: Dietary intake will improve Outcome: Not Met (add Reason)   Problem: Nutrition: Goal: Adequate nutrition will be maintained Outcome: Not Met (add Reason)   Problem: Skin Integrity: Goal: Risk for impaired skin integrity will decrease Outcome: Not Met (add Reason)   Problem: Cardiovascular: Goal: Ability to achieve and maintain adequate cardiovascular perfusion will improve Outcome: Not Met (add Reason)   .

## 2023-03-14 NOTE — Plan of Care (Signed)
  Problem: Ischemic Stroke/TIA Tissue Perfusion: Goal: Complications of ischemic stroke/TIA will be minimized Outcome: Not Progressing   Problem: Nutrition: Goal: Risk of aspiration will decrease Outcome: Not Progressing   Problem: Nutrition: Goal: Dietary intake will improve Outcome: Not Progressing   Problem: Activity: Goal: Risk for activity intolerance will decrease Outcome: Not Progressing   Problem: Coping: Goal: Level of anxiety will decrease Outcome: Not Progressing   Problem: Skin Integrity: Goal: Risk for impaired skin integrity will decrease Outcome: Not Progressing

## 2023-03-14 NOTE — Progress Notes (Signed)
 Inpatient Rehabilitation Admissions Coordinator   I met at bedside with patient's boyfriend, Ta. I explained that she is not alert enough to pursue Cir level rehab at this time. He is hopeful that she will soon. He states he plans to move in with her and her sister who is an aide can assist her when he is at work. He states her Mom is waiting to ask her where she wants to go at discharge when she is more awake. We will follow at a distance as she is not at a level to pursue Cir at this time.  Heron Leavell, RN, MSN Rehab Admissions Coordinator [phone] 03/14/2023 11:02 AM

## 2023-03-14 NOTE — Progress Notes (Signed)
 Nutrition Follow-up  DOCUMENTATION CODES:   Not applicable  INTERVENTION:   -Continue tube feeding via Cortrak: Would continue tube feeding until patient demonstrates PO intakes meeting at least 65% estimated needs. Osmolite 1.5 at 55 ml/h (1320 ml per day) Prosource TF20 60 ml BID  Provides 2140 kcal, 123 gm protein, 1005 ml free water  daily  -Dysphagia III diet with nectar thick liquids  -Continue calorie count, RD to follow up on results on 1/13.  -Continue micronutrient supplementation.   NUTRITION DIAGNOSIS:   Inadequate oral intake related to inability to eat as evidenced by NPO status.  -ongoing, addressing with tube feeding  GOAL:   Patient will meet greater than or equal to 90% of their needs  -progressing  MONITOR:   TF tolerance, PO intake, Supplement acceptance, Labs, Weight trends  REASON FOR ASSESSMENT:   Consult Calorie Count  ASSESSMENT:   Pt with PMH of hemoglobin C trait, unprovoked PE not currently on anticoagulation admitted with aphasia after heavy alcohol use and recreation use of Suboxone. 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  12/20 - s/p mechanical thrombectomy attempted but unsuccessful, extubated after  12/21 - significant agitation requiring restrains and Precedex  12/23 - s/p cortrak placement; per xray tip in distal stomach 12/26 - pt pulled out cortrak, MRI showed stroke extension  12/27- cortak replaced (distal stomach vs proximal duodenum) 1/7 - FEES, DYS3, nectar thick   Patient would not awaken to voice or shoulder rub. RN in room during visit.  Calorie count in progress, did not eat any thing for two meals and only 15% of third meal. Will continue. Current weight down 3.9 kg from admit, continue to monitor.  Medications reviewed and include vitamin B12, folic acid , iron  polysaccharides 150 mg daily, MVI  15 ml daily liquid, Miralax .  Labs reviewed  Diet Order:   Diet Order             DIET DYS 3 Fluid consistency: Nectar Thick  Diet effective now                   EDUCATION NEEDS:   No education needs have been identified at this time  Skin:  Skin Assessment: Reviewed RN Assessment  Last BM:  03/12/2023  Height:   Ht Readings from Last 1 Encounters:  02/21/23 5' 4 (1.626 m)    Weight:   Wt Readings from Last 1 Encounters:  03/14/23 76.5 kg    Ideal Body Weight:  54.5 kg  BMI:  Body mass index is 28.95 kg/m.  Estimated Nutritional Needs:   Kcal:  2000-2200  Protein:  100-115 grams  Fluid:  >2 L/day    Elveria Sable, RDLD Clinical Dietitian If unable to reach, please contact RD Inpatient secure chat group between 8 am-4 pm daily

## 2023-03-14 NOTE — Progress Notes (Signed)
 Occupational Therapy Treatment Patient Details Name: Jacqueline [name] MRN: [id number] DOB: 12/28/2000 Today's Date: 03/14/2023   History of present illness The pt is a 23 yo female presenting 12/20 with expressive aphasia. 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   OT comments  Pt is making limited progress towards their acute OT goals. Pt seen with PT this date for safety. Pt with increased arousal after upright positional change. Pt followed 2-3 commands this date with maximal multimodal cues. Total A +2 needed for all aspects of mobility and grooming tasks attempted. She moves the LUE to internal stimuli, but not to automatic functional tasks. Pt righted her balance in sitting 1 time, and held up her head ~50% of the time in sitting. She continues to have very poor visual tracking to stimuli and sustained visual attention, however is able to locate stim with maximal cues for a brief period in time. Pt also stated mama, unable to facilitate a second time or any other verbalizations. OT to continue to follow acutely to facilitate progress towards established goals. Pt will continue to benefit from intensive inpatient follow up therapy, >3 hours/day after discharge.       If plan is discharge home, recommend the following:  Two people to help with walking and/or transfers;Two people to help with bathing/dressing/bathroom;Assistance with cooking/housework;Direct supervision/assist for medications management;Assistance with feeding;Direct supervision/assist for financial management;Help with stairs or ramp for entrance;Assist for transportation;Supervision due to cognitive status   Equipment Recommendations  Other (comment)       Precautions / Restrictions Precautions Precautions: Fall Precaution Comments: cortrak Restrictions Weight  Bearing Restrictions Per Provider Order: No       Mobility Bed Mobility Overal bed mobility: Needs Assistance Bed Mobility: Supine to Sit, Sit to Supine     Supine to sit: Total assist, +2 for physical assistance, +2 for safety/equipment Sit to supine: Total assist, +2 for physical assistance, +2 for safety/equipment   General bed mobility comments: followed command to bend knee with maximal multimodal cues to prepare for bed mobility    Transfers                         Balance Overall balance assessment: Needs assistance Sitting-balance support: Feet supported Sitting balance-Leahy Scale: Zero Sitting balance - Comments: fluctuates from flaccid, pushing with LUE and righting balance with L                                   ADL either performed or assessed with clinical judgement   ADL Overall ADL's : Needs assistance/impaired     Grooming: Total assistance;Wash/dry face Grooming Details (indicate cue type and reason): hand over hand for washing face                             Functional mobility during ADLs: Total assistance;+2 for physical assistance;+2 for safety/equipment General ADL Comments: remains total A+2 for all aspects of care. Once EOB pt righted balance 1x with use of trunk and LUE, otherwise total A for sitting balance.    Extremity/Trunk Assessment Upper Extremity Assessment Upper Extremity Assessment: RUE deficits/detail;LUE deficits/detail RUE Deficits / Details: no AROM noted, tone in elbow. reflexive movements to coughing and yawning RUE Sensation:  decreased light touch;decreased proprioception RUE Coordination: decreased fine motor;decreased gross motor LUE Deficits / Details: tightness with elbow extension but able to get full PROM. active movement to internal stimuli only, no functional use of extremity or command following LUE Coordination: decreased fine motor;decreased gross motor   Lower Extremity  Assessment Lower Extremity Assessment: Defer to PT evaluation        Vision   Vision Assessment?: Vision impaired- to be further tested in functional context Additional Comments: attended to stimuli in R and L environments with maximal multimodal cues and increased time. very poor sustained visual attention   Perception Perception Perception: Not tested   Praxis Praxis Praxis: Not tested    Cognition Arousal: Lethargic Behavior During Therapy: Flat affect, Restless Overall Cognitive Status: Difficult to assess                                 General Comments: eyes open with maximal stimulation and positional change. visually attended to stimuli <10% of the time. did not follow any commands. Noted righting reactions to balance 1x              General Comments increased arousal this date    Pertinent Vitals/ Pain       Pain Assessment Pain Assessment: Faces Faces Pain Scale: Hurts a little bit Pain Location: generalized, crying once sitting EOB Pain Descriptors / Indicators: Moaning, Grimacing Pain Intervention(s): Limited activity within patient's tolerance, Monitored during session   Frequency  Min 1X/week        Progress Toward Goals  OT Goals(current goals can now be found in the care plan section)  Progress towards OT goals: Progressing toward goals  Acute Rehab OT Goals Patient Stated Goal: unable to state OT Goal Formulation: Patient unable to participate in goal setting Time For Goal Achievement: 03/20/23 Potential to Achieve Goals: Good ADL Goals Pt Will Perform Grooming: with mod assist;bed level Pt Will Perform Upper Body Bathing: with max assist;sitting Pt/caregiver will Perform Home Exercise Program: Both right and left upper extremity;With minimal assist;Increased strength Additional ADL Goal #1: Pt will perform bed mobility with Mod A +2 in preparation for ADLs Additional ADL Goal #2: Pt will maintain sitting balance at EOB with  Min A for 5 min in preparation for ADLs  Plan      Co-evaluation    PT/OT/SLP Co-Evaluation/Treatment: Yes Reason for Co-Treatment: Complexity of the patient's impairments (multi-system involvement);To address functional/ADL transfers   OT goals addressed during session: ADL's and self-care      AM-PAC OT 6 Clicks Daily Activity     Outcome Measure   Help from another person eating meals?: Total Help from another person taking care of personal grooming?: Total Help from another person toileting, which includes using toliet, bedpan, or urinal?: Total Help from another person bathing (including washing, rinsing, drying)?: Total Help from another person to put on and taking off regular upper body clothing?: Total Help from another person to put on and taking off regular lower body clothing?: Total 6 Click Score: 6    End of Session Equipment Utilized During Treatment: Gait belt  OT Visit Diagnosis: Unsteadiness on feet (R26.81);Other abnormalities of gait and mobility (R26.89);Muscle weakness (generalized) (M62.81);Hemiplegia and hemiparesis;Pain Hemiplegia - Right/Left: Right Hemiplegia - caused by: Cerebral infarction   Activity Tolerance Patient tolerated treatment well   Patient Left in bed;with call bell/phone within reach;with chair alarm set   Nurse Communication Mobility status;Need  for lift equipment        Time: 1301-1331 OT Time Calculation (min): 30 min  Charges: OT General Charges $OT Visit: 1 Visit OT Treatments $Therapeutic Activity: 8-22 mins  Lucie Kendall, OTR/L Acute Rehabilitation Services Office [phone] Secure Chat Communication Preferred   Lucie JONETTA Kendall 03/14/2023, 2:20 PM

## 2023-03-14 NOTE — Progress Notes (Signed)
 PHARMACY - ANTICOAGULATION CONSULT NOTE  Pharmacy Consult:  Heparin  Indication: Lupus AC and endoluminal thrombus   No Known Allergies  Patient Measurements: Height: 5' 4 (162.6 cm) Weight: 76.5 kg (168 lb 10.4 oz) IBW/kg (Calculated) : 54.7 Heparin  Dosing Weight: 72 kg  Vital Signs: Temp: 97.8 F (36.6 C) (01/10 0757) Temp Source: Oral (01/10 0757) BP: 93/59 (01/10 0757) Pulse Rate: 91 (01/10 0757)  Labs: Recent Labs    03/12/23 0557 03/13/23 0739 03/13/23 0928 03/14/23 0715  HGB 9.6* 9.4*  --  10.0*  HCT 28.6* 28.6*  --  30.2*  PLT 75* 78*  --  77*  HEPARINUNFRC 0.45 0.42  --  0.39  CREATININE 0.83  --  0.66 0.62    Estimated Creatinine Clearance: 108.5 mL/min (by C-G formula based on SCr of 0.62 mg/dL).   Medical History: Past Medical History:  Diagnosis Date   Bacterial vaginitis 07/10/2016   Candida vaginitis 07/24/2016   Headache in pregnancy, antepartum, third trimester 07/24/2016   Kell isoimmunization during pregnancy     Assessment: 22 YOF presented with acute CVA and MRI also shows endoluminal thrombus.  Patient has a history of unprovoked PE (04/2022) treated with Eliquis  and no longer on anticoagulation. Hx of thrombocytopenia. Pharmacy consulted to dose heparin .  Heparin  level continues to be therapeutic. Plt remains >50k. Hgb 9-10s. NOAC once can take oral.  Goal of Therapy:  Heparin  level 0.3-0.5 units/ml Monitor platelets by anticoagulation protocol: Yes   Plan:  Continue heparin  gtt at 1150 units/hr Daily heparin  level, CBC, s/s bleeding F/U ability to tolerate po  Sergio Batch, PharmD, [place], AAHIVP, CPP Infectious Disease Pharmacist 03/14/2023 9:55 AM

## 2023-03-14 NOTE — Progress Notes (Signed)
 Physical Therapy Treatment Patient Details Name: Jacqueline [name] MRN: [id number] DOB: February 23, 2001 Today's Date: 03/14/2023   History of Present Illness The pt is a 23 yo female presenting 12/20 with expressive aphasia. 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    PT Comments  Pt continues to make slow progress towards acute PT goals. Seen in conjunction with OT to maximize functional mobility and for safety. Pt requiring total A +2 to come to sitting upright EOB with pt alertness improving with time spent seated up EOB. Pt able to follow a few commands with MAX multimodal cueing and demonstrated ability to maintain sitting balance with CGA for 2-3 seconds x2 bouts, otherwise pt requiring total A to maintain upright sitting. Unable to solicit functional/intentional mobility with LUE however pt moving LUE/LE to internal stimuli. Current plan remains appropriate to address deficits and maximize functional independence and decrease caregiver burden. Pt continues to benefit from skilled PT services to progress toward functional mobility goals.     If plan is discharge home, recommend the following: Two people to help with walking and/or transfers;Two people to help with bathing/dressing/bathroom;Assistance with cooking/housework;Assistance with feeding;Direct supervision/assist for medications management;Direct supervision/assist for financial management;Assist for transportation;Help with stairs or ramp for entrance;Supervision due to cognitive status   Can travel by private vehicle        Equipment Recommendations  Other (comment)    Recommendations for Other Services       Precautions / Restrictions Precautions Precautions: Fall Precaution Comments: cortrak Restrictions Weight Bearing Restrictions Per Provider Order: No      Mobility  Bed Mobility Overal bed mobility: Needs Assistance Bed Mobility: Supine to Sit, Sit to Supine     Supine to sit: Total assist, +2 for physical assistance, +2 for safety/equipment Sit to supine: Total assist, +2 for physical assistance, +2 for safety/equipment   General bed mobility comments: followed command to bend knee with maximal multimodal cues to prepare for bed mobility    Transfers                        Ambulation/Gait                   Stairs             Wheelchair Mobility     Tilt Bed    Modified Rankin (Stroke Patients Only)       Balance Overall balance assessment: Needs assistance Sitting-balance support: Feet supported Sitting balance-Leahy Scale: Zero Sitting balance - Comments: fluctuates from flaccid, pushing with LUE and righting balance with L                                    Cognition Arousal: Lethargic Behavior During Therapy: Flat affect, Restless Overall Cognitive Status: Difficult to assess                                 General Comments: eyes open with maximal stimulation and positional change. visually attended to stimuli <10% of the time. did not follow any commands. Noted righting reactions to balance 1x        Exercises      General Comments General comments (skin integrity, edema, etc.): increased  arousal this date      Pertinent Vitals/Pain Pain Assessment Pain Assessment: Faces Faces Pain Scale: Hurts a little bit Pain Location: generalized, crying once sitting EOB Pain Descriptors / Indicators: Moaning, Grimacing Pain Intervention(s): Limited activity within patient's tolerance, Monitored during session    Home Living                          Prior Function            PT Goals (current goals can now be found in the care plan section) Acute Rehab PT Goals PT Goal Formulation: Patient unable to participate in goal setting Time For  Goal Achievement: 03/24/23 Progress towards PT goals: Progressing toward goals (slowly this date)    Frequency    Min 1X/week      PT Plan      Co-evaluation PT/OT/SLP Co-Evaluation/Treatment: Yes Reason for Co-Treatment: Complexity of the patient's impairments (multi-system involvement);To address functional/ADL transfers PT goals addressed during session: Balance;Mobility/safety with mobility OT goals addressed during session: ADL's and self-care      AM-PAC PT 6 Clicks Mobility   Outcome Measure  Help needed turning from your back to your side while in a flat bed without using bedrails?: Total Help needed moving from lying on your back to sitting on the side of a flat bed without using bedrails?: Total Help needed moving to and from a bed to a chair (including a wheelchair)?: Total Help needed standing up from a chair using your arms (e.g., wheelchair or bedside chair)?: Total Help needed to walk in hospital room?: Total Help needed climbing 3-5 steps with a railing? : Total 6 Click Score: 6    End of Session   Activity Tolerance: Patient limited by lethargy Patient left: in bed;with call bell/phone within reach;with bed alarm set Nurse Communication: Mobility status PT Visit Diagnosis: Other abnormalities of gait and mobility (R26.89);Hemiplegia and hemiparesis;Other symptoms and signs involving the nervous system (R29.898) Hemiplegia - Right/Left: Right Hemiplegia - dominant/non-dominant: Dominant Hemiplegia - caused by: Cerebral infarction     Time: 1302-1330 PT Time Calculation (min) (ACUTE ONLY): 28 min  Charges:    $Therapeutic Activity: 8-22 mins PT General Charges $$ ACUTE PT VISIT: 1 Visit                     Shyvonne Chastang R. PTA Acute Rehabilitation Services Office: [phone]   Therisa CHRISTELLA Boor 03/14/2023, 3:40 PM

## 2023-03-15 DIAGNOSIS — I63512 Cerebral infarction due to unspecified occlusion or stenosis of left middle cerebral artery: Secondary | ICD-10-CM | POA: Diagnosis not present

## 2023-03-15 LAB — GLUCOSE, CAPILLARY
Glucose-Capillary: 110 mg/dL — ABNORMAL HIGH (ref 70–99)
Glucose-Capillary: 115 mg/dL — ABNORMAL HIGH (ref 70–99)
Glucose-Capillary: 116 mg/dL — ABNORMAL HIGH (ref 70–99)
Glucose-Capillary: 120 mg/dL — ABNORMAL HIGH (ref 70–99)
Glucose-Capillary: 139 mg/dL — ABNORMAL HIGH (ref 70–99)
Glucose-Capillary: 90 mg/dL (ref 70–99)
Glucose-Capillary: 98 mg/dL (ref 70–99)

## 2023-03-15 NOTE — Progress Notes (Signed)
  Progress Note   Patient: Jacqueline [name] FMW:[id number] DOB: 2000/06/03 DOA: 02/21/2023     22 DOS: the patient was seen and examined on 03/15/2023 at 10:37AM      Brief hospital course: 23 y.o. F with hx thrombocytopenia, post-partum hemorrhage, PE and positive lupus AC lost to Hematology follow up who presented with stroke.   See summary from 1/10         Assessment and Plan: Acute ischemic stroke - PT/OT     Agitation - Continue clonazepam  taper   Lupus anticoagulant History of pulmonary embolism - Continue Eliquis  - Follow-up with rheumatology   UTI Resolved   Anemia Thrombocytopenia Stable - Outpatient hematology and rheumatology follow up             Subjective: No change, no nursing concerns.         Physical Exam: BP 106/69 (BP Location: Left Arm)   Pulse 100   Temp 98.2 F (36.8 C) (Axillary)   Resp 19   Ht 5' 4 (1.626 m)   Wt 76.5 kg   SpO2 100%   BMI 28.95 kg/m   Adult female, lying in bed, staring.  Makes eye contact briefly when I approach her left side.     RRR, nl S1-S2, no murmurs appreciated.   No peripheral edema.   Normal respiratory rate and rhythm.  CTAB without rales or wheezes. Abdomen soft without grimace to palpation.  No distension or HSM.   No intelligible verbalizations.  Does not appear to comprehend or follow verbal commands.  Right hemiparesis is dense.         Data Reviewed:  Glucose normal   Family Communication: Boyfriend at the bedside, called mother, no answer   Disposition: Status is: Inpatient         Author: Lonni SHAUNNA Dalton, MD 03/15/2023 12:31 PM  For on call review [email or web address].

## 2023-03-15 NOTE — Plan of Care (Signed)
  Problem: Education: Goal: Knowledge of disease or condition will improve Outcome: Progressing Goal: Knowledge of secondary prevention will improve (MUST DOCUMENT ALL) Outcome: Progressing Goal: Knowledge of patient specific risk factors will improve [name] N/A or DELETE if not current risk factor) Outcome: Progressing   Problem: Ischemic Stroke/TIA Tissue Perfusion: Goal: Complications of ischemic stroke/TIA will be minimized Outcome: Progressing   Problem: Coping: Goal: Will verbalize positive feelings about self Outcome: Progressing Goal: Will identify appropriate support needs Outcome: Progressing   Problem: Health Behavior/Discharge Planning: Goal: Ability to manage health-related needs will improve Outcome: Progressing Goal: Goals will be collaboratively established with patient/family Outcome: Progressing   Problem: Self-Care: Goal: Ability to participate in self-care as condition permits will improve Outcome: Progressing Goal: Verbalization of feelings and concerns over difficulty with self-care will improve Outcome: Progressing Goal: Ability to communicate needs accurately will improve Outcome: Progressing   Problem: Nutrition: Goal: Risk of aspiration will decrease Outcome: Progressing Goal: Dietary intake will improve Outcome: Progressing   Problem: Education: Goal: Knowledge of General Education information will improve Description: Including pain rating scale, medication(s)/side effects and non-pharmacologic comfort measures Outcome: Progressing   Problem: Health Behavior/Discharge Planning: Goal: Ability to manage health-related needs will improve Outcome: Progressing   Problem: Clinical Measurements: Goal: Ability to maintain clinical measurements within normal limits will improve Outcome: Progressing Goal: Will remain free from infection Outcome: Progressing Goal: Diagnostic test results will improve Outcome: Progressing Goal: Cardiovascular  complication will be avoided Outcome: Progressing   Problem: Activity: Goal: Risk for activity intolerance will decrease Outcome: Progressing   Problem: Nutrition: Goal: Adequate nutrition will be maintained Outcome: Progressing   Problem: Coping: Goal: Level of anxiety will decrease Outcome: Progressing   Problem: Elimination: Goal: Will not experience complications related to bowel motility Outcome: Progressing Goal: Will not experience complications related to urinary retention Outcome: Progressing   Problem: Pain Management: Goal: General experience of comfort will improve Outcome: Progressing   Problem: Safety: Goal: Ability to remain free from injury will improve Outcome: Progressing   Problem: Skin Integrity: Goal: Risk for impaired skin integrity will decrease Outcome: Progressing   Problem: Education: Goal: Understanding of CV disease, CV risk reduction, and recovery process will improve Outcome: Progressing Goal: Individualized Educational Video(s) Outcome: Progressing   Problem: Activity: Goal: Ability to return to baseline activity level will improve Outcome: Progressing   Problem: Cardiovascular: Goal: Ability to achieve and maintain adequate cardiovascular perfusion will improve Outcome: Progressing   Problem: Health Behavior/Discharge Planning: Goal: Ability to safely manage health-related needs after discharge will improve Outcome: Progressing

## 2023-03-15 NOTE — Plan of Care (Signed)

## 2023-03-16 DIAGNOSIS — I63512 Cerebral infarction due to unspecified occlusion or stenosis of left middle cerebral artery: Secondary | ICD-10-CM | POA: Diagnosis not present

## 2023-03-16 LAB — GLUCOSE, CAPILLARY
Glucose-Capillary: 103 mg/dL — ABNORMAL HIGH (ref 70–99)
Glucose-Capillary: 111 mg/dL — ABNORMAL HIGH (ref 70–99)
Glucose-Capillary: 146 mg/dL — ABNORMAL HIGH (ref 70–99)
Glucose-Capillary: 102 mg/dL — ABNORMAL HIGH (ref 70–99)
Glucose-Capillary: 124 mg/dL — ABNORMAL HIGH (ref 70–99)

## 2023-03-16 NOTE — Plan of Care (Signed)
  Problem: Education: Goal: Knowledge of disease or condition will improve Outcome: Progressing Goal: Knowledge of secondary prevention will improve (MUST DOCUMENT ALL) Outcome: Progressing Goal: Knowledge of patient specific risk factors will improve Jacqueline [name] N/A or DELETE if not current risk factor) Outcome: Progressing   Problem: Ischemic Stroke/TIA Tissue Perfusion: Goal: Complications of ischemic stroke/TIA will be minimized Outcome: Progressing   Problem: Coping: Goal: Will verbalize positive feelings about self Outcome: Progressing

## 2023-03-16 NOTE — Progress Notes (Signed)
  Progress Note   Patient: Jacqueline [name] FMW:[id number] DOB: 01-20-2001 DOA: 02/21/2023     23 DOS: the patient was seen and examined on 03/16/2023 at 11:18AM      Brief hospital course: 23 y.o. F with hx thrombocytopenia, post-partum hemorrhage, PE and positive lupus AC lost to Hematology follow up who presented with stroke.   See prior summary         Assessment and Plan: Acute ischemic stroke - PT/OT     Agitation Sedation is slowing her physical rehabilitation.  Per my discussion with boyfriend, who is frequently at the bedside, she does not appear to be in discomfort or pain, does not appear to need palliating medicines.  I believe her crying is a reflection of unmet needs given her dense aphasia.   - Continue clonazepam  - Continue PRN Ativan    Lupus anticoagulant History of pulmonary embolism - Continue Eliquis  - Follow-up with rheumatology   Anemia Thrombocytopenia Stable - Outpatient hematology and rheumatology follow up             Subjective: No change.          Physical Exam: BP 122/87 (BP Location: Left Arm)   Pulse (!) 118   Temp 99.4 F (37.4 C) (Oral)   Resp 18   Ht 5' 4 (1.626 m)   Wt 76.5 kg   SpO2 100%   BMI 28.95 kg/m   Adult female, lying in bed, staring.  Makes eye contact briefly  RRR, nl S1-S2, no murmurs appreciated.   No peripheral edema.   Normal respiratory rate and rhythm.  CTAB without rales or wheezes. Abdomen soft without grimace to palpation.  No distension or HSM.   No intelligible verbalizations.  Does not appear to comprehend or follow verbal commands.  Right hemiparesis is dense and contractured.           Family Communication: Boyfriend at the bedside and mother by phone       Disposition: Status is: Inpatient         Author: Lonni SHAUNNA Dalton, MD 03/16/2023 4:11 PM  For on call review [email or web address].

## 2023-03-16 NOTE — Plan of Care (Signed)
  Problem: Education: Goal: Knowledge of disease or condition will improve Outcome: Progressing Goal: Knowledge of secondary prevention will improve (MUST DOCUMENT ALL) Outcome: Progressing Goal: Knowledge of patient specific risk factors will improve [name] N/A or DELETE if not current risk factor) Outcome: Progressing   Problem: Ischemic Stroke/TIA Tissue Perfusion: Goal: Complications of ischemic stroke/TIA will be minimized Outcome: Progressing   Problem: Coping: Goal: Will verbalize positive feelings about self Outcome: Progressing Goal: Will identify appropriate support needs Outcome: Progressing   Problem: Health Behavior/Discharge Planning: Goal: Ability to manage health-related needs will improve Outcome: Progressing Goal: Goals will be collaboratively established with patient/family Outcome: Progressing   Problem: Self-Care: Goal: Ability to participate in self-care as condition permits will improve Outcome: Progressing Goal: Verbalization of feelings and concerns over difficulty with self-care will improve Outcome: Progressing Goal: Ability to communicate needs accurately will improve Outcome: Progressing   Problem: Nutrition: Goal: Risk of aspiration will decrease Outcome: Progressing Goal: Dietary intake will improve Outcome: Progressing   Problem: Education: Goal: Knowledge of General Education information will improve Description: Including pain rating scale, medication(s)/side effects and non-pharmacologic comfort measures Outcome: Progressing   Problem: Health Behavior/Discharge Planning: Goal: Ability to manage health-related needs will improve Outcome: Progressing   Problem: Clinical Measurements: Goal: Ability to maintain clinical measurements within normal limits will improve Outcome: Progressing Goal: Will remain free from infection Outcome: Progressing Goal: Diagnostic test results will improve Outcome: Progressing Goal: Cardiovascular  complication will be avoided Outcome: Progressing   Problem: Activity: Goal: Risk for activity intolerance will decrease Outcome: Progressing   Problem: Nutrition: Goal: Adequate nutrition will be maintained Outcome: Progressing   Problem: Coping: Goal: Level of anxiety will decrease Outcome: Progressing   Problem: Elimination: Goal: Will not experience complications related to bowel motility Outcome: Progressing Goal: Will not experience complications related to urinary retention Outcome: Progressing   Problem: Pain Management: Goal: General experience of comfort will improve Outcome: Progressing   Problem: Safety: Goal: Ability to remain free from injury will improve Outcome: Progressing   Problem: Skin Integrity: Goal: Risk for impaired skin integrity will decrease Outcome: Progressing   Problem: Education: Goal: Understanding of CV disease, CV risk reduction, and recovery process will improve Outcome: Progressing Goal: Individualized Educational Video(s) Outcome: Progressing   Problem: Activity: Goal: Ability to return to baseline activity level will improve Outcome: Progressing   Problem: Cardiovascular: Goal: Ability to achieve and maintain adequate cardiovascular perfusion will improve Outcome: Progressing   Problem: Health Behavior/Discharge Planning: Goal: Ability to safely manage health-related needs after discharge will improve Outcome: Progressing

## 2023-03-17 DIAGNOSIS — I63512 Cerebral infarction due to unspecified occlusion or stenosis of left middle cerebral artery: Secondary | ICD-10-CM | POA: Diagnosis not present

## 2023-03-17 LAB — GLUCOSE, CAPILLARY
Glucose-Capillary: 110 mg/dL — ABNORMAL HIGH (ref 70–99)
Glucose-Capillary: 114 mg/dL — ABNORMAL HIGH (ref 70–99)
Glucose-Capillary: 116 mg/dL — ABNORMAL HIGH (ref 70–99)
Glucose-Capillary: 140 mg/dL — ABNORMAL HIGH (ref 70–99)
Glucose-Capillary: 150 mg/dL — ABNORMAL HIGH (ref 70–99)
Glucose-Capillary: 93 mg/dL (ref 70–99)

## 2023-03-17 MED ORDER — TOPIRAMATE 25 MG PO TABS
25.0000 mg | ORAL_TABLET | Freq: Two times a day (BID) | ORAL | Status: DC
  Administered 2023-03-17 – 2023-03-25 (×15): 25 mg via ORAL
  Filled 2023-03-17 (×15): qty 1

## 2023-03-17 NOTE — Plan of Care (Signed)
  Problem: Education: Goal: Knowledge of disease or condition will improve Outcome: Progressing Goal: Knowledge of secondary prevention will improve (MUST DOCUMENT ALL) Outcome: Progressing Goal: Knowledge of patient specific risk factors will improve [name] N/A or DELETE if not current risk factor) Outcome: Progressing   Problem: Ischemic Stroke/TIA Tissue Perfusion: Goal: Complications of ischemic stroke/TIA will be minimized Outcome: Progressing   Problem: Coping: Goal: Will verbalize positive feelings about self Outcome: Progressing Goal: Will identify appropriate support needs Outcome: Progressing   Problem: Health Behavior/Discharge Planning: Goal: Ability to manage health-related needs will improve Outcome: Progressing Goal: Goals will be collaboratively established with patient/family Outcome: Progressing   Problem: Self-Care: Goal: Ability to participate in self-care as condition permits will improve Outcome: Progressing Goal: Verbalization of feelings and concerns over difficulty with self-care will improve Outcome: Progressing Goal: Ability to communicate needs accurately will improve Outcome: Progressing   Problem: Nutrition: Goal: Risk of aspiration will decrease Outcome: Progressing Goal: Dietary intake will improve Outcome: Progressing   Problem: Education: Goal: Knowledge of General Education information will improve Description: Including pain rating scale, medication(s)/side effects and non-pharmacologic comfort measures Outcome: Progressing   Problem: Health Behavior/Discharge Planning: Goal: Ability to manage health-related needs will improve Outcome: Progressing   Problem: Clinical Measurements: Goal: Ability to maintain clinical measurements within normal limits will improve Outcome: Progressing Goal: Will remain free from infection Outcome: Progressing Goal: Diagnostic test results will improve Outcome: Progressing Goal: Cardiovascular  complication will be avoided Outcome: Progressing   Problem: Activity: Goal: Risk for activity intolerance will decrease Outcome: Progressing   Problem: Nutrition: Goal: Adequate nutrition will be maintained Outcome: Progressing   Problem: Coping: Goal: Level of anxiety will decrease Outcome: Progressing   Problem: Elimination: Goal: Will not experience complications related to bowel motility Outcome: Progressing Goal: Will not experience complications related to urinary retention Outcome: Progressing   Problem: Pain Management: Goal: General experience of comfort will improve Outcome: Progressing   Problem: Safety: Goal: Ability to remain free from injury will improve Outcome: Progressing   Problem: Skin Integrity: Goal: Risk for impaired skin integrity will decrease Outcome: Progressing   Problem: Education: Goal: Understanding of CV disease, CV risk reduction, and recovery process will improve Outcome: Progressing Goal: Individualized Educational Video(s) Outcome: Progressing   Problem: Activity: Goal: Ability to return to baseline activity level will improve Outcome: Progressing   Problem: Cardiovascular: Goal: Ability to achieve and maintain adequate cardiovascular perfusion will improve Outcome: Progressing   Problem: Health Behavior/Discharge Planning: Goal: Ability to safely manage health-related needs after discharge will improve Outcome: Progressing

## 2023-03-17 NOTE — Progress Notes (Signed)
 Calorie Count Note  48-hour calorie count ordered.  Diet: DYS3, nectar thick Supplements: Mighty Shake TID with meals  Estimated Nutritional Needs:  Kcal:  2000-2200 Protein:  100-115 grams Fluid:  >2 L/day  1/10 Breakfast: refused 1/10 Lunch: refused 1/10 Dinner: 85 kcal, 1g protein Daily Total: 85kcal, 1g protein  1/11 - no meals recorded, unsure of intake  1/12 Dinner: refused 1/13 Breakfast: 112kcal, 7g protein  Total average daily intake: 99 kcal (5% of minimum estimated needs)  4 protein (4% of minimum estimated needs)  NUTRITION DIAGNOSIS:  Inadequate oral intake related to inability to eat as evidenced by NPO status. - remains applicable   GOAL:  Patient will meet greater than or equal to 90% of their needs - progressing, TF at goal, diet advanced   INTERVENTIONS:  Pt has been on a diet since 1/7 with minimal progress towards achieving nutrition goals PO. SLP notes indicate ongoing issues with lethargy and inability to maintain attention span. Pt will likely require nutrition support longterm and thus would recommend PEG tube to continue feeds at next venue of care while pt continues to work of her speech and feeding therapy.  Continue TF via Cortrak: Osmolite 1.5 at 55 ml/h Prosource TF 20 60 ml BID Free water  flush 100 q4h Provides 2240 kcal, 122 gm protein, 1003 ml free water  daily. ( free water  = TF+flush) Continue MVI, Vitamin B12, thiamine , and iron  supplementation. Mighty Shake TID with meals, each supplement provides 330 kcal and 9 grams of protein   Vernell Lukes, RD, LDN Registered Dietitian II Please reach out via secure chat Weekend on-call pager # available in [facility]

## 2023-03-17 NOTE — Progress Notes (Signed)
 Physical Therapy Treatment Patient Details Name: Jacqueline [name] MRN: [id number] DOB: 12/07/2000 Today's Date: 03/17/2023   History of Present Illness The pt is a 23 yo female presenting 12/20 with expressive aphasia. 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    PT Comments  Pt awake and alert on arrival, crying, however receptive to encouragement and reassurance with pt calming. Pt able to initiate LLE to and off EOB without physical assist with verbal and tactile commands and elevate trunk to sitting with max A. Pt able to follow commands for LE exercises on L in sitting and lift LUE to high five on command x1. Pt tracking this PTA when moving about room, but unable answer yes/no questions verbally or with head nods. Current plan remains appropriate to address deficits and maximize functional independence and decrease caregiver burden. Pt continues to benefit from skilled PT services to progress toward functional mobility goals.      If plan is discharge home, recommend the following: Two people to help with walking and/or transfers;Two people to help with bathing/dressing/bathroom;Assistance with cooking/housework;Assistance with feeding;Direct supervision/assist for medications management;Direct supervision/assist for financial management;Assist for transportation;Help with stairs or ramp for entrance;Supervision due to cognitive status   Can travel by private vehicle        Equipment Recommendations  Other (comment)    Recommendations for Other Services       Precautions / Restrictions Precautions Precautions: Fall Precaution Comments: cortrak Restrictions Weight Bearing Restrictions Per Provider Order: No     Mobility  Bed Mobility Overal bed mobility: Needs Assistance Bed Mobility: Supine to Sit, Sit to Supine      Supine to sit: Max assist, HOB elevated Sit to supine: Max assist   General bed mobility comments: followed command to bring LLE to and off EOB, pt able to return LLE to bed at end of session without assist, max A to eleavte trunk    Transfers Overall transfer level: Needs assistance                 General transfer comment: unsafe to attempt    Ambulation/Gait               General Gait Details: NT   Stairs             Wheelchair Mobility     Tilt Bed    Modified Rankin (Stroke Patients Only) Modified Rankin (Stroke Patients Only) Pre-Morbid Rankin Score: No symptoms Modified Rankin: Severe disability     Balance Overall balance assessment: Needs assistance Sitting-balance support: Feet supported Sitting balance-Leahy Scale: Zero Sitting balance - Comments: fluctuates from flaccid, pushing with LUE and righting balance with L                                    Cognition Arousal: Alert Behavior During Therapy: Flat affect, Restless, Anxious Overall Cognitive Status: Difficult to assess Area of Impairment: Attention, Following commands                   Current Attention Level: Focused   Following Commands: Follows one step commands inconsistently Safety/Judgement: Decreased awareness of safety, Decreased awareness of deficits   Problem Solving: Slow processing, Decreased initiation General Comments: eyes open throughout session, pt crying out at start of session and seemily due  to frustration during session, receptive to encouraging words and pt calming, able to follow single step commands with increased time, not answering yes/no questions        Exercises Other Exercises Other Exercises: no active movement Rt side noted, LUE appeared to be posturing and stiff into flexion Other Exercises: able to kick (LAQ) with LLE on command x10 Other Exercises: pt able to high five x1 during session seated up EOB     General Comments        Pertinent Vitals/Pain Pain Assessment Pain Assessment: Faces Faces Pain Scale: Hurts a little bit Pain Location: generalized, crying once sitting EOB Pain Descriptors / Indicators: Moaning, Grimacing Pain Intervention(s): Monitored during session, Limited activity within patient's tolerance    Home Living                          Prior Function            PT Goals (current goals can now be found in the care plan section) Acute Rehab PT Goals Patient Stated Goal: return to independence, to see her kids PT Goal Formulation: Patient unable to participate in goal setting Time For Goal Achievement: 03/24/23 Progress towards PT goals: Progressing toward goals    Frequency    Min 1X/week      PT Plan      Co-evaluation              AM-PAC PT 6 Clicks Mobility   Outcome Measure  Help needed turning from your back to your side while in a flat bed without using bedrails?: Total Help needed moving from lying on your back to sitting on the side of a flat bed without using bedrails?: Total Help needed moving to and from a bed to a chair (including a wheelchair)?: Total Help needed standing up from a chair using your arms (e.g., wheelchair or bedside chair)?: Total Help needed to walk in hospital room?: Total Help needed climbing 3-5 steps with a railing? : Total 6 Click Score: 6    End of Session Equipment Utilized During Treatment: Gait belt Activity Tolerance: Patient limited by lethargy Patient left: in bed;with call bell/phone within reach;with bed alarm set;with nursing/sitter in room Nurse Communication: Mobility status PT Visit Diagnosis: Other abnormalities of gait and mobility (R26.89);Hemiplegia and hemiparesis;Other symptoms and signs involving the nervous system (R29.898) Hemiplegia - Right/Left: Right Hemiplegia - dominant/non-dominant: Dominant Hemiplegia - caused by: Cerebral infarction     Time: 8481-8453 PT  Time Calculation (min) (ACUTE ONLY): 28 min  Charges:    $Therapeutic Activity: 23-37 mins PT General Charges $$ ACUTE PT VISIT: 1 Visit                     Zorawar Strollo R. PTA Acute Rehabilitation Services Office: [phone]   Therisa CHRISTELLA Boor 03/17/2023, 4:46 PM

## 2023-03-17 NOTE — Progress Notes (Addendum)
 Inpatient Rehab Admissions Coordinator:   Stopped by to see pt at bedside.  She continues to be severely impaired.  She was awake, sitting up in bed with CNA trying to feed.  She did track me, but followed no commands and did not verbalize at all.  Reviewed consult from Dr. Lovorn on 12/31, recommendations for medication adjustments.  Messaged Dr. Jonel to follow up on her medical course given prolonged stay.  We will continue to follow.   Reche Lowers, PT, DPT Admissions Coordinator [phone] 03/17/23  4:59 PM

## 2023-03-17 NOTE — Progress Notes (Signed)
  Progress Note   Patient: Jacqueline [name] FMW:[id number] DOB: 2000-08-04 DOA: 02/21/2023     24 DOS: the patient was seen and examined on 03/17/2023 at 9:26AM      Brief hospital course: 23 y.o. F with hx thrombocytopenia, post-partum hemorrhage, PE and positive lupus AC lost to Hematology follow up who presented with stroke.   See prior summary         Assessment and Plan: Acute ischemic stroke CTA showed occluded left M2 MCA, s/p IR with TICI 3. LDL 42.  Carotids and echo unremarkable.  Source suspected hypercoagulable state due to lupus anticoagulant. - Continue Eliquis  - PT/OT    Cognitive impairment Patient has had minimal oral intake for weeks, and has been dependent on feeding tube.   Calorie count over the weekend showed that even now 3 weeks out she is eating ~5% of her caloric needs. - Recommend PEG  Agitation See note from 1/12 - Avoid oversedation, vocalizations are expressions of unmet need - Continue clonazepam  - Continue PRN Ativan    Lupus anticoagulant History of pulmonary embolism - Continue Eliquis  - Follow-up with rheumatology   Anemia Thrombocytopenia Chronic thrombocytopenia.  Seen by Hematology during index hospitalization 2/24 (thrombocytopenia attributed to nutritional deficiencies) and then in the office then lost to follow up.  Seen again by hematology this admission, no explanation for thrombocytopenia proposed.  Platelets stable at ~70K - Continue B12, folate, MVI, iron  - Outpatient hematology and rheumatology follow up             Subjective: No change.          Physical Exam: BP 97/61 (BP Location: Left Arm)   Pulse 94   Temp 98 F (36.7 C) (Axillary)   Resp 18   Ht 5' 4 (1.626 m)   Wt 76.5 kg   SpO2 100%   BMI 28.95 kg/m   Adult female, lying in bed, staring.  Makes eye contact briefly  RRR, nl S1-S2, no murmurs appreciated.   No peripheral edema.   Normal respiratory rate and rhythm.  CTAB without rales or  wheezes. Abdomen soft without grimace to palpation.  No distension or HSM.   No intelligible verbalizations.  Does not appear to comprehend or follow verbal commands.  Right hemiparesis is dense and contractured.          Family Communication: Boyfriend at the bedside and mother by phone     Disposition: Status is: Inpatient         Author: Lonni SHAUNNA Dalton, MD 03/17/2023 4:12 PM  For on call review [email or web address]

## 2023-03-18 ENCOUNTER — Inpatient Hospital Stay (HOSPITAL_COMMUNITY): Payer: Medicaid Other

## 2023-03-18 DIAGNOSIS — I63512 Cerebral infarction due to unspecified occlusion or stenosis of left middle cerebral artery: Secondary | ICD-10-CM | POA: Diagnosis not present

## 2023-03-18 LAB — GLUCOSE, CAPILLARY
Glucose-Capillary: 103 mg/dL — ABNORMAL HIGH (ref 70–99)
Glucose-Capillary: 105 mg/dL — ABNORMAL HIGH (ref 70–99)
Glucose-Capillary: 106 mg/dL — ABNORMAL HIGH (ref 70–99)
Glucose-Capillary: 111 mg/dL — ABNORMAL HIGH (ref 70–99)
Glucose-Capillary: 123 mg/dL — ABNORMAL HIGH (ref 70–99)
Glucose-Capillary: 124 mg/dL — ABNORMAL HIGH (ref 70–99)
Glucose-Capillary: 156 mg/dL — ABNORMAL HIGH (ref 70–99)

## 2023-03-18 LAB — BASIC METABOLIC PANEL
Anion gap: 11 (ref 5–15)
BUN: 17 mg/dL (ref 6–20)
CO2: 23 mmol/L (ref 22–32)
Calcium: 9.5 mg/dL (ref 8.9–10.3)
Chloride: 104 mmol/L (ref 98–111)
Creatinine, Ser: 0.8 mg/dL (ref 0.44–1.00)
GFR, Estimated: >60 mL/min (ref >60)
Glucose, Bld: 108 mg/dL — ABNORMAL HIGH (ref 70–99)
Potassium: 4 mmol/L (ref 3.5–5.1)
Sodium: 138 mmol/L (ref 135–145)

## 2023-03-18 LAB — CBC
HCT: 36.3 % (ref 36.0–46.0)
Hemoglobin: 12.1 g/dL (ref 12.0–15.0)
MCH: 26.2 pg (ref 26.0–34.0)
MCHC: 33.3 g/dL (ref 30.0–36.0)
MCV: 78.6 fL — ABNORMAL LOW (ref 80.0–100.0)
Platelets: 121 10*3/uL — ABNORMAL LOW (ref 150–400)
RBC: 4.62 MIL/uL (ref 3.87–5.11)
WBC: 8.3 10*3/uL (ref 4.0–10.5)
nRBC: 0 % (ref 0.0–0.2)

## 2023-03-18 MED ORDER — OXYCODONE HCL 5 MG/5ML PO SOLN
5.0000 mg | Freq: Four times a day (QID) | ORAL | Status: DC | PRN
  Administered 2023-03-18 – 2023-03-25 (×20): 5 mg via ORAL
  Filled 2023-03-18 (×21): qty 5

## 2023-03-18 MED ORDER — GABAPENTIN 100 MG PO CAPS
100.0000 mg | ORAL_CAPSULE | Freq: Three times a day (TID) | ORAL | Status: DC
  Administered 2023-03-19 – 2023-03-25 (×18): 100 mg via ORAL
  Filled 2023-03-18 (×18): qty 1

## 2023-03-18 MED ORDER — CLONAZEPAM 0.5 MG PO TABS
0.5000 mg | ORAL_TABLET | Freq: Every day | ORAL | Status: DC
  Administered 2023-03-19 – 2023-03-24 (×6): 0.5 mg
  Filled 2023-03-18 (×6): qty 1

## 2023-03-18 NOTE — Progress Notes (Signed)
 Physical Therapy Treatment Patient Details Name: Jacqueline [name] MRN: [id number] DOB: 10/28/2000 Today's Date: 03/18/2023   History of Present Illness The pt is a 23 yo female presenting 12/20 with expressive aphasia. 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    PT Comments  Pt seen with OT this session to optimize activity tolerance and functional mobility progression. Pt crying out on arrival however pt receptive to reassurance and calming throughout session, however becoming lethargic by end of session and falling asleep quickly at end of session. Pt requiring max A +2 to complete bed mobility and heavy mod A to maintain sitting balance up at EOB. Pt able to participate in propped sitting R/L and able to high five on command x1 this session. Unable to socilicite active functional movement of LLE at EOB however pt able to assist in bring LLE to and off EOB and able to bring LLE completely up into bed at end of session. Current plan remains appropriate to address deficits and maximize functional independence and decrease caregiver burden. Pt continues to benefit from skilled PT services to progress toward functional mobility goals.     If plan is discharge home, recommend the following: Two people to help with walking and/or transfers;Two people to help with bathing/dressing/bathroom;Assistance with cooking/housework;Assistance with feeding;Direct supervision/assist for medications management;Direct supervision/assist for financial management;Assist for transportation;Help with stairs or ramp for entrance;Supervision due to cognitive status   Can travel by private vehicle        Equipment Recommendations  Other (comment)    Recommendations for Other Services       Precautions / Restrictions Precautions Precautions: Fall Precaution  Comments: cortrak Restrictions Weight Bearing Restrictions Per Provider Order: No     Mobility  Bed Mobility Overal bed mobility: Needs Assistance Bed Mobility: Supine to Sit, Sit to Supine Rolling: Total assist   Supine to sit: Max assist, +2 for physical assistance, +2 for safety/equipment Sit to supine: +2 for physical assistance, +2 for safety/equipment, Max assist   General bed mobility comments: pt assisting with maneuver LLE to EOB but requried assist to manage remainder of supine>sit. MAX A +2 to return to bed from sitting with pt assisting with elevating LLE back to bed, pt with seemingly max fatigue at end of session and needing total A to roll R/L    Transfers                   General transfer comment: unsafe to attempt    Ambulation/Gait                   Stairs             Wheelchair Mobility     Tilt Bed    Modified Rankin (Stroke Patients Only) Modified Rankin (Stroke Patients Only) Pre-Morbid Rankin Score: No symptoms Modified Rankin: Severe disability     Balance Overall balance assessment: Needs assistance Sitting-balance support: Feet supported Sitting balance-Leahy Scale: Zero Sitting balance - Comments: heavy MODA for static sitting balance however pt pushes intermittently with LUE at times with pt needing up to MAX A, pt did engage in propped sitting to R and L side with MAX A Postural control: Right lateral lean, Posterior lean  Cognition Arousal: Alert, Lethargic (lethargic by end of session) Behavior During Therapy: Lability Overall Cognitive Status: Difficult to assess Area of Impairment: Attention, Following commands, Safety/judgement, Problem solving                   Current Attention Level: Focused   Following Commands: Follows one step commands inconsistently Safety/Judgement: Decreased awareness of safety, Decreased awareness of deficits   Problem  Solving: Slow processing, Decreased initiation, Difficulty sequencing, Requires verbal cues, Requires tactile cues General Comments: pt crying at start of session, pt following commands inconsistently        Exercises Other Exercises Other Exercises: lateral leans to BUEs for NMR to RUE Other Exercises: PROM to RUE Other Exercises: pt able to high five x1 during session seated up EOB    General Comments        Pertinent Vitals/Pain Pain Assessment Pain Assessment: Faces Faces Pain Scale: Hurts little more Pain Location: generalized, crying upon arrival Pain Descriptors / Indicators: Moaning, Grimacing, Crying Pain Intervention(s): Monitored during session, Limited activity within patient's tolerance, Repositioned    Home Living                          Prior Function            PT Goals (current goals can now be found in the care plan section) Acute Rehab PT Goals Patient Stated Goal: return to independence, to see her kids PT Goal Formulation: Patient unable to participate in goal setting Time For Goal Achievement: 03/24/23 Progress towards PT goals: Progressing toward goals    Frequency    Min 1X/week      PT Plan      Co-evaluation   Reason for Co-Treatment: Complexity of the patient's impairments (multi-system involvement);To address functional/ADL transfers;Necessary to address cognition/behavior during functional activity;For patient/therapist safety   OT goals addressed during session: ADL's and self-care;Strengthening/ROM      AM-PAC PT 6 Clicks Mobility   Outcome Measure  Help needed turning from your back to your side while in a flat bed without using bedrails?: Total Help needed moving from lying on your back to sitting on the side of a flat bed without using bedrails?: Total Help needed moving to and from a bed to a chair (including a wheelchair)?: Total Help needed standing up from a chair using your arms (e.g., wheelchair or  bedside chair)?: Total Help needed to walk in hospital room?: Total Help needed climbing 3-5 steps with a railing? : Total 6 Click Score: 6    End of Session   Activity Tolerance: Patient limited by fatigue;Other (comment) (limited by cognitive and language deficits) Patient left: in bed;with call bell/phone within reach;with bed alarm set Nurse Communication: Mobility status PT Visit Diagnosis: Other abnormalities of gait and mobility (R26.89);Hemiplegia and hemiparesis;Other symptoms and signs involving the nervous system (R29.898) Hemiplegia - Right/Left: Right Hemiplegia - dominant/non-dominant: Dominant Hemiplegia - caused by: Cerebral infarction     Time: 8565-8496 PT Time Calculation (min) (ACUTE ONLY): 29 min  Charges:    $Therapeutic Activity: 8-22 mins PT General Charges $$ ACUTE PT VISIT: 1 Visit                     Lalah Durango R. PTA Acute Rehabilitation Services Office: [phone]   Therisa CHRISTELLA Boor 03/18/2023, 5:08 PM

## 2023-03-18 NOTE — Plan of Care (Signed)
  Problem: Coping: Goal: Will verbalize positive feelings about self Outcome: Not Progressing   Problem: Education: Goal: Knowledge of patient specific risk factors will improve Jacqueline [name] N/A or DELETE if not current risk factor) Outcome: Progressing

## 2023-03-18 NOTE — Progress Notes (Signed)
 Occupational Therapy Treatment Patient Details Name: Jacqueline [name] MRN: [id number] DOB: 2000/04/01 Today's Date: 03/18/2023   History of present illness The pt is a 23 yo female presenting 12/20 with expressive aphasia. 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   OT comments  Pt seen in conjunction with PT to maximize pts activity tolerance and optimize pt participation. Pt initially crying upon entrance however pts affect improved as session progressed. Pt continues to present with impaired balance, RUE hemiplegia, and decreased activity tolerance. Pt currently requires MAX A +2 for bed mobility and heavy MODA for static sitting balance. Worked on lateral leans to hemiparetic RUE with pt needing MAX A for lateral weight shifts. Pt following commands inconsistently but pt was able to reach to therapists hand to give a high five with LUE. Hoping inpatient rehab will continue to follow pt for continued progress as pt would benefit from structured stroke rehab once pt is able to tolerate level of intensity.        If plan is discharge home, recommend the following:  Two people to help with walking and/or transfers;Two people to help with bathing/dressing/bathroom;Assistance with cooking/housework;Direct supervision/assist for medications management;Assistance with feeding;Direct supervision/assist for financial management;Help with stairs or ramp for entrance;Assist for transportation;Supervision due to cognitive status   Equipment Recommendations  Other (comment)    Recommendations for Other Services      Precautions / Restrictions Precautions Precautions: Fall Precaution Comments: cortrak Restrictions Weight Bearing Restrictions Per Provider Order: No       Mobility Bed Mobility Overal bed mobility: Needs Assistance Bed  Mobility: Supine to Sit, Sit to Supine Rolling: Total assist   Supine to sit: Max assist, +2 for physical assistance, +2 for safety/equipment Sit to supine: +2 for physical assistance, +2 for safety/equipment, Max assist   General bed mobility comments: pt assisting with maneuver LLE to EOB but requried assist to manage remainder of supine>sit. MAX A +2 to return to bed from sitting with pt assisting with elevating LLE back to bed    Transfers                   General transfer comment: unsafe to attempt     Balance Overall balance assessment: Needs assistance Sitting-balance support: Feet supported Sitting balance-Leahy Scale: Zero Sitting balance - Comments: heavy MODA for static sitting balance however pt pushes intermittently with LUE at times with pt needing up to MAX A, pt did engage in propped sitting to R and L side with MAX A Postural control: Right lateral lean, Posterior lean                                 ADL either performed or assessed with clinical judgement   ADL Overall ADL's : Needs assistance/impaired     Grooming: Wash/dry face;Total assistance Grooming Details (indicate cue type and reason): total A to wash face         Upper Body Dressing : Total assistance;Sitting Upper Body Dressing Details (indicate cue type and reason): to don fresh gown Lower Body Dressing: Total assistance;Bed level Lower Body Dressing Details (indicate cue type and reason): to don socks from bed level             Functional mobility during ADLs: Total assistance;+2 for physical assistance;Cueing for safety;Cueing for  sequencing (bed mobility only) General ADL Comments: ADL participation requries total A at this time    Extremity/Trunk Assessment Upper Extremity Assessment Upper Extremity Assessment: RUE deficits/detail;LUE deficits/detail RUE Deficits / Details: no AROM noted, tone in elbow that fluctuates based on positioning RUE Sensation:  decreased light touch;decreased proprioception RUE Coordination: decreased fine motor;decreased gross motor LUE Deficits / Details: pt was able to reach to therapist for a high five but followed command inconsistently, difficult to assess whether it is a ROM deficit or cognitive challenge LUE Coordination: decreased fine motor;decreased gross motor   Lower Extremity Assessment Lower Extremity Assessment: Defer to PT evaluation        Vision   Vision Assessment?: Vision impaired- to be further tested in functional context Additional Comments: poor sustained visual attention but is able to attend to stimulus in all quadrants   Perception Perception Perception: Not tested   Praxis Praxis Praxis: Not tested    Cognition Arousal: Alert, Lethargic (lethargic by end of session) Behavior During Therapy: Lability Overall Cognitive Status: Difficult to assess Area of Impairment: Attention, Following commands, Safety/judgement, Problem solving                   Current Attention Level: Focused   Following Commands: Follows one step commands inconsistently Safety/Judgement: Decreased awareness of safety, Decreased awareness of deficits   Problem Solving: Slow processing, Decreased initiation, Difficulty sequencing, Requires verbal cues, Requires tactile cues General Comments: pt crying at start of session, pt following commands inconsistently        Exercises Other Exercises Other Exercises: lateral leans to BUEs for NMR to RUE Other Exercises: PROM to RUE    Shoulder Instructions       General Comments      Pertinent Vitals/ Pain       Pain Assessment Pain Assessment: Faces Faces Pain Scale: Hurts little more Pain Location: generalized, crying upon arrival Pain Descriptors / Indicators: Moaning, Grimacing, Crying Pain Intervention(s): Monitored during session, Repositioned, Limited activity within patient's tolerance  Home Living                                           Prior Functioning/Environment              Frequency  Min 1X/week        Progress Toward Goals  OT Goals(current goals can now be found in the care plan section)  Progress towards OT goals: Progressing toward goals (gradually)  Acute Rehab OT Goals OT Goal Formulation: Patient unable to participate in goal setting Time For Goal Achievement: 03/20/23 Potential to Achieve Goals: Fair  Plan      Co-evaluation    PT/OT/SLP Co-Evaluation/Treatment: Yes Reason for Co-Treatment: Complexity of the patient's impairments (multi-system involvement);To address functional/ADL transfers;Necessary to address cognition/behavior during functional activity;For patient/therapist safety   OT goals addressed during session: ADL's and self-care;Strengthening/ROM      AM-PAC OT 6 Clicks Daily Activity     Outcome Measure   Help from another person eating meals?: Total Help from another person taking care of personal grooming?: Total Help from another person toileting, which includes using toliet, bedpan, or urinal?: Total Help from another person bathing (including washing, rinsing, drying)?: Total Help from another person to put on and taking off regular upper body clothing?: Total Help from another person to put on and taking off regular lower body clothing?: Total  6 Click Score: 6    End of Session    OT Visit Diagnosis: Unsteadiness on feet (R26.81);Other abnormalities of gait and mobility (R26.89);Muscle weakness (generalized) (M62.81);Hemiplegia and hemiparesis;Pain Hemiplegia - Right/Left: Right Hemiplegia - caused by: Cerebral infarction   Activity Tolerance Patient tolerated treatment well   Patient Left in bed;with call bell/phone within reach;with bed alarm set   Nurse Communication Mobility status;Other (comment) (via secure chat)        Time: 8565-8496 OT Time Calculation (min): 29 min  Charges: OT General Charges $OT Visit: 1  Visit OT Treatments $Neuromuscular Re-education: 8-22 mins  Ronal Mallie POUR., COTA/L Acute Rehabilitation Services [phone]   Ronal Mallie Needy 03/18/2023, 3:24 PM

## 2023-03-18 NOTE — TOC Progression Note (Signed)
 Transition of Care [facility]) - Progression Note    Patient Details  Name: Jacqueline [name] MRN: [id number] Date of Birth: December 29, 2000  Transition of [facility]) CM/SW Contact  Andrez JULIANNA George, RN Phone Number: 03/18/2023, 10:20 AM  Clinical Narrative:   CIR following but not at the point to tolerate CIR. TOC following.   Expected Discharge Plan: IP Rehab Facility    Expected Discharge Plan and Services                                               Social Determinants of Health (SDOH) Interventions SDOH Screenings   Food Insecurity: No Food Insecurity (03/07/2023)  Housing: Patient Unable To Answer (02/21/2023)  Transportation Needs: Patient Unable To Answer (02/21/2023)  Utilities: Patient Unable To Answer (02/21/2023)  Financial Resource Strain: Not on File (06/21/2021)   Received from [place], [place]  Physical Activity: Not on File (06/21/2021)   Received from [place], [place]  Social Connections: Not on File (11/16/2022)   Received from [facility]  Stress: Not on File (06/21/2021)   Received from [place], [place]  Tobacco Use: High Risk (02/21/2023)    Readmission Risk Interventions     No data to display

## 2023-03-18 NOTE — Progress Notes (Signed)
 Progress Note   Patient: Jacqueline [name] FMW:[id number] DOB: [birth year] DOA: 02/21/2023     25 DOS: the patient was seen and examined on 03/18/2023 at 11:48AM      Brief hospital course: 23 y.o. F with hx thrombocytopenia, post-partum hemorrhage, PE and positive lupus AC lost to Hematology follow up who presented with stroke.   Night before admission, drank a significant amount of alcohol and took some Suboxone that was not prescribed to her.     In the morning, had expressive aphasia and was brought to ER.  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  She went to IR for mechanical thrombectomy, but this was unfortunately unsuccessful.   Patient was agitated for the first 4-5 days in the ICU, required Precedex .  12/26 MRI brain showed extension of infarct, now very large.              Assessment and Plan: Acute ischemic stroke, likely related to hypercoagulability from lupus anticoagulant Presented with CTA showing occluded left proximal M2 MCA, and MRI with large left MCA territory infarct.  Underwent thrombectomy which seemed initially successful, but then had worsening stenosis and subsequent complete closure of left M2  Repeat MRI showed extension of stroke on hospital day 5.  EEG with severe encephalopathy, no seizure. LDL 42, A1c normal Known lupus anticoagulant, last hematology follow-up, no longer on anticoagulation. Has dense aphasia which impacts ability to participate in rehab - Continue Eliquis  - Continue attempts at physical therapy     Cognitive impairment As a result of her extensive stroke, the patient has had minimal oral intake.  Seems to swallow chopped diet well without aspiration, but cognition limits oral intake.  Calorie count Jan 10-13 showed <10% caloric needs. - IR consult for PEG - Continue tube feeds and free water  - Consult dietitian   Aphasia Previous notes describe agitation.  I see only expected behaviors for a young person with severe  physical debility and dense receptive and expressive aphasia.    I have interacted with her, observed family interacting with her, and reviewed notes from PM&R and SLP and consistently (at least as of Jan 14) see no evidence that she comprehends language, can follow commands consistently.   Her crying and inability to show sustained cooperation in cares is likely a reflection of aphasia.  She was on Precedex  and Depakene  earlier in this hospital stay, but these were stopped due to agitation. - Taper clonazepam  to nightly use - Continue Topamax  as recommended by PM&R 12/31 - Start gabapentin  (again as recommended by PM&R) - Avoid haldol  or benzodiazepines for sedation, as these impair the patient from calling out if she is soiled or in pain   Lupus anticoagulant History of pulmonary embolism - Continue Eliquis  -Follow-up with rheumatology   UTI Resolved   Anemia Thrombocytopenia Chronic thrombocytopenia.  Seen by Hematology during index hospitalization 2/24 (thrombocytopenia attributed to nutritional deficiencies) and then in the office then lost to follow up.  Seen again by hematology this admission, no explanation for thrombocytopenia proposed.  Platelets stable at ~70K - Continue B12, folate, MVI, iron  - Outpatient hematology and rheumatology follow up             Subjective: No change, no nursing concerns.         Physical Exam: BP 113/71 (BP Location: Left Arm)   Pulse 91   Temp 99.1 F (37.3 C) (Oral)   Resp 17   Ht 5' 4 (1.626 m)   Wt 76.5 kg  SpO2 100%   BMI 28.95 kg/m   Adult female, lying in bed, staring.  Makes eye contact briefly when I approach her left side.     Tachycarid, regular, nl S1-S2, no murmurs appreciated.   No peripheral edema.   Normal respiratory rate and rhythm.  CTAB without rales or wheezes. Abdomen soft without grimace to palpation.  No distension or HSM.   No intelligible verbalizations.  Does not appear to comprehend or follow  verbal commands.  Right hemiparesis is dense.         Data Reviewed: Glucoses normal, will reduce to once daily No other new labs   Family Communication: Spoke to mother yesterday to discuss PEG tube plans      Disposition: Status is: Inpatient Admitted with massive stroke  Now with severe physical and verbal deficits.  Will need PEG placement.  After PEG placement, she can and should proceed to rehab         Author: Lonni SHAUNNA Dalton, MD 03/18/2023 3:31 PM  For on call review [email or web address].

## 2023-03-18 NOTE — Progress Notes (Signed)
 Speech Language Pathology Treatment: Dysphagia;Cognitive-Linquistic  Patient Details Note written by Waddell Music, SLP Student Consuelo Fort, MA CCC-SLP  Acute Rehabilitation Services Secure Chat Preferred Office [phone]  Name: Jacqueline [name] MRN: [id number] DOB: [birth year] Today's Date: 03/18/2023 Time: 8659-8587 SLP Time Calculation (min) (ACUTE ONLY): 32 min  Assessment / Plan / Recommendation Clinical Impression  Visited pt after arrival of lunch tray. Pt was awake and visually tracking people throughout session. SLP provided pt thin soda in a cup with ice. Pt used straw to sip soda with hand over hand assistance from SLP. Pt took consecutive sips, sometimes impulsively, and showed delayed coughing. SLP provided meat and mashed potatoes with hand over hand assistance to pt until pt spit them out. SLP asked want/need yes/no questions during meal to pt using a small whiteboard and marker for written reinforcement. Pt was trialed using eye tracking and pointing. While SLP provided yes and no on whiteboard, pt was inconsistent with selecting an answer with visual tracking. However, pt appeared to initiate hand/arm movement towards her answer for provided yes question prompts by SLP. Continue current diet, though intake insufficient. Agree with plan for PEG.    HPI HPI: The pt is a 23 yo female presenting 12/20 with expressive aphasia. 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 MRI showed fairly extensive acute left MCA territory and watershed territory infarcts within the left cerebral hemisphere as well as small acute R frontoparietal cortical/subcortical infarcts. Pt initially passed the Yale swallow screen but become more aphasic and started to cough with PO intake. 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      SLP Plan  Continue with current plan of care       Recommendations for follow up therapy are one component of a multi-disciplinary discharge planning process, led by the attending physician.  Recommendations may be updated based on patient status, additional functional criteria and insurance authorization.    Recommendations  Diet recommendations: Dysphagia 3 (mechanical soft);Thin liquid Liquids provided via: Cup;Straw                      Frequent or constant Supervision/Assistance Dysphagia, oropharyngeal phase (R13.12)     Continue with current plan of care     Shalanda Brogden, Consuelo Fitch  03/18/2023, 2:31 PM

## 2023-03-18 NOTE — Progress Notes (Signed)
 Nutrition Follow-up  DOCUMENTATION CODES:  Not applicable  INTERVENTION:  Continue TF via Cortrak: Osmolite 1.5 at 55 ml/h Prosource TF 20 60 ml BID Free water  flush 100 q4h Provides 2240 kcal, 122 gm protein, 1003 ml free water  daily. ( free water  = TF+flush) Pt has been on a diet since 1/7 but is not taking in anywhere near adequate amounts to sustain nutrition and hydration. Pt will likely require nutrition support longterm - recommend PEG tube to continue feeds at next venue of care while pt continues to work of her speech and feeding therapy.  MD discussed with family Continue MVI, Vitamin B12, thiamine , and iron  supplementation. Mighty Shake TID with meals, each supplement provides 330 kcal and 9 grams of protein Recommend adjust CBG checks to decrease in frequency as pt is stable on current TF regimen  NUTRITION DIAGNOSIS:  Inadequate oral intake related to inability to eat as evidenced by NPO status. 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  12/20 - s/p mechanical thrombectomy attempted but unsuccessful, extubated after  12/21 - significant agitation requiring restrains and Precedex  12/23 - s/p cortrak placement; per xray tip in distal stomach 12/26 - pt pulled out cortrak, MRI showed stroke extension  12/27- cortak replaced (distal stomach vs proximal duodenum) 1/7 - FEES, DYS3, nectar thick 1/9-1/12: Calorie count,  poor PO intake with pt only meeting 5% of needs orally  Pt working with therapy at the time of assessment. Discussed PO intake with MD after kcal count and recommend that PEG tube be placed as pt still has a long way to progress in order to meet her nutrition and hydration needs orally. Consult to IR placed to evaluate ability to place. Will adjust pt to bolus feeds as she approaches being ready for next venue of care.   Admit weight: 72.6 Current weight: 76.5 kg   Nutritionally Relevant Medications: Scheduled Meds:  vitamin B-12  2,000 mcg Per Tube Daily   PROSource TF20  60 mL Per Tube BID   folic acid   1 mg Per Tube Daily   free water   100 mL Per Tube Q4H   iron  polysaccharides  150 mg Per Tube Daily   multivitamin  15 mL Per Tube Daily   polyethylene glycol  17 g Per Tube Daily   Continuous Infusions:  feeding supplement (OSMOLITE 1.5 CAL) 1,000 mL (03/18/23 0701)   PRN Meds: ondansetron   Labs Reviewed: CBG ranges from 93-156 mg/dL over the last 24 hours HgbA1c 4.4% (12/20)  NUTRITION - FOCUSED PHYSICAL EXAM: Flowsheet Row Most Recent Value  Orbital Region No depletion  Upper Arm Region No depletion  Thoracic and Lumbar Region Mild depletion  Buccal Region No depletion  Temple Region No depletion  Clavicle Bone Region No depletion  Clavicle and Acromion Bone Region No depletion  Scapular Bone Region No depletion  Dorsal Hand Unable to assess  Patellar Region No depletion  Anterior Thigh Region No depletion  Posterior Calf Region No depletion  Edema (  RD Assessment) None  Hair Reviewed  Eyes Unable to assess  Mouth Unable to assess  Skin Reviewed  Nails Unable to assess    Diet Order:   Diet Order             DIET DYS 3 Fluid consistency: Nectar Thick  Diet effective now                   EDUCATION NEEDS:  No education needs have been identified at this time  Skin:  Skin Assessment: Reviewed RN Assessment  Last BM:  1/14 - type 6  Height:  Ht  Readings from Last 1 Encounters:  02/21/23 5' 4 (1.626 m)    Weight:  Wt Readings from Last 1 Encounters:  03/14/23 76.5 kg    Ideal Body Weight:  54.5 kg  BMI:  Body mass index is 28.95 kg/m.  Estimated Nutritional Needs:  Kcal:  2000-2200 Protein:  100-115 grams Fluid:  >2 L/day    Vernell Lukes, RD, LDN Registered Dietitian II Please reach out via secure chat Weekend on-call pager # available in [facility]

## 2023-03-19 LAB — COMPREHENSIVE METABOLIC PANEL
ALT: 41 U/L (ref 0–44)
AST: 34 U/L (ref 15–41)
Albumin: 3.2 g/dL — ABNORMAL LOW (ref 3.5–5.0)
Alkaline Phosphatase: 77 U/L (ref 38–126)
Anion gap: 9 (ref 5–15)
BUN: 19 mg/dL (ref 6–20)
CO2: 23 mmol/L (ref 22–32)
Calcium: 9.3 mg/dL (ref 8.9–10.3)
Chloride: 102 mmol/L (ref 98–111)
Creatinine, Ser: 0.76 mg/dL (ref 0.44–1.00)
GFR, Estimated: >60 mL/min (ref >60)
Glucose, Bld: 123 mg/dL — ABNORMAL HIGH (ref 70–99)
Potassium: 3.7 mmol/L (ref 3.5–5.1)
Sodium: 134 mmol/L — ABNORMAL LOW (ref 135–145)
Total Bilirubin: 0.5 mg/dL (ref 0.0–1.2)
Total Protein: 7.3 g/dL (ref 6.5–8.1)

## 2023-03-19 LAB — CBC
HCT: 35 % — ABNORMAL LOW (ref 36.0–46.0)
Hemoglobin: 11.9 g/dL — ABNORMAL LOW (ref 12.0–15.0)
MCH: 26.8 pg (ref 26.0–34.0)
MCHC: 34 g/dL (ref 30.0–36.0)
MCV: 78.8 fL — ABNORMAL LOW (ref 80.0–100.0)
Platelets: 106 10*3/uL — ABNORMAL LOW (ref 150–400)
RBC: 4.44 MIL/uL (ref 3.87–5.11)
WBC: 8 10*3/uL (ref 4.0–10.5)
nRBC: 0 % (ref 0.0–0.2)

## 2023-03-19 LAB — GLUCOSE, CAPILLARY
Glucose-Capillary: 104 mg/dL — ABNORMAL HIGH (ref 70–99)
Glucose-Capillary: 116 mg/dL — ABNORMAL HIGH (ref 70–99)

## 2023-03-19 LAB — APTT: aPTT: 82 s — ABNORMAL HIGH (ref 24–36)

## 2023-03-19 LAB — HEPARIN LEVEL (UNFRACTIONATED): Heparin Unfractionated: 0.63 [IU]/mL (ref 0.30–0.70)

## 2023-03-19 MED ORDER — IOHEXOL 300 MG/ML  SOLN
75.0000 mL | Freq: Once | INTRAMUSCULAR | Status: AC
  Administered 2023-03-20: 75 mL
  Filled 2023-03-19 (×3): qty 75

## 2023-03-19 MED ORDER — IOHEXOL 300 MG/ML  SOLN: 75.0000 mL | Freq: Once | INTRAMUSCULAR | Status: DC

## 2023-03-19 MED ORDER — HEPARIN (PORCINE) 25000 UT/250ML-% IV SOLN
1050.0000 [IU]/h | INTRAVENOUS | Status: DC
  Administered 2023-03-19 – 2023-03-21 (×3): 1150 [IU]/h via INTRAVENOUS
  Filled 2023-03-19 (×3): qty 250

## 2023-03-19 MED ORDER — MELATONIN 5 MG PO TABS
5.0000 mg | ORAL_TABLET | Freq: Every evening | ORAL | Status: DC | PRN
  Administered 2023-03-19 – 2023-03-24 (×5): 5 mg via ORAL
  Filled 2023-03-19 (×5): qty 1

## 2023-03-19 NOTE — Progress Notes (Addendum)
 PHARMACY - ANTICOAGULATION CONSULT NOTE  Pharmacy Consult: Eliquis  >> IV Heparin  Indication: Lupus AC and endoluminal thrombus   No Known Allergies  Patient Measurements: Height: 5\' 4"  (162.6 cm) Weight: 76.5 kg (168 lb 10.4 oz) IBW/kg (Calculated) : 54.7 Heparin  Dosing Weight: 72 kg  Vital Signs: Temp: 98.9 F (37.2 C) (01/15 0802) Temp Source: Oral (01/15 0802) BP: 104/65 (01/15 0802) Pulse Rate: 80 (01/15 0802)  Labs: Recent Labs    03/18/23 0518 03/19/23 0559  HGB 12.1 11.9*  HCT 36.3 35.0*  PLT 121* 106*  CREATININE 0.80 0.76    Estimated Creatinine Clearance: 108.5 mL/min (by C-G formula based on SCr of 0.76 mg/dL).   Medical History: Past Medical History:  Diagnosis Date   Bacterial vaginitis 07/10/2016   Candida vaginitis 07/24/2016   Headache in pregnancy, antepartum, third trimester 07/24/2016   Kell isoimmunization during pregnancy     Assessment: 22 YOF presented with acute CVA and MRI also shows endoluminal thrombus.  Patient has a history of unprovoked PE (04/2022) treated with Eliquis  and no longer on anticoagulation. Hx of thrombocytopenia. Patient was transitioned from IV heparin  to Eliquis  earlier this admission. Pharmacy consulted to transition Eliquis  back to IV heparin  in anticipation of PEG tube placement.   Last dose of Eliquis  1/14 @ 2153. Begin IV heparin  this morning ~12h post last Eliquis  dose. Anticipate Eliquis  to falsely elevate heparin  level so will dose off aPTT for now. Previously therapeutic with heparin  at 1150 units/hr - will resume. Noted hx thrombocytopenia - plts low 100s.   Goal of Therapy:  Heparin  level 0.3-0.7 units/ml aPTT 66-102 seconds Monitor platelets by anticoagulation protocol: Yes   Plan:  Start heparin  gtt at prev therapeutic rate of 1150 units/hr Check aPTT and heparin  level in 6 hours Daily heparin  level, CBC, s/s bleeding F/U PEG tube placement (tentatively 1/17) and ability to resume Eliquis    Christianna Cowman, PharmD, BCPS 03/19/2023 9:45 AM

## 2023-03-19 NOTE — Progress Notes (Signed)
 Progress Note   Patient: Jacqueline [name] RUE:[id number] DOB: 11/12/2000 DOA: 02/21/2023     26 DOS: the patient was seen and examined on 03/19/2023 at 11:48AM      Brief hospital course: 23 y.o. F with hx thrombocytopenia, post-partum hemorrhage, PE and positive lupus AC lost to Hematology follow up who presented with stroke.   Night before admission, drank a significant amount of alcohol and took some Suboxone that was not prescribed to her.     In the morning, had expressive aphasia and was brought to ER.  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  She went to IR for mechanical thrombectomy, but this was unfortunately unsuccessful.   Patient was agitated for the first 4-5 days in the ICU, required Precedex .  12/26 MRI brain showed extension of infarct, now very large.              Assessment and Plan: Acute ischemic stroke, likely related to hypercoagulability from lupus anticoagulant Presented with CTA showing occluded left proximal M2 MCA, and MRI with large left MCA territory infarct.  Underwent thrombectomy which seemed initially successful, but then had worsening stenosis and subsequent complete closure of left M2  Repeat MRI showed extension of stroke on hospital day 5.  EEG with severe encephalopathy, no seizure. LDL 42, A1c normal Known lupus anticoagulant, last hematology follow-up, no longer on anticoagulation. Has dense aphasia which impacts ability to participate in rehab - Transition to heparin  infusion given plans for PEG tube 1/17.  - Continue attempts at physical therapy     Cognitive impairment As a result of her extensive stroke, the patient has had minimal oral intake.  Seems to swallow chopped diet well without aspiration, but cognition limits oral intake.  Calorie count Jan 10-13 showed <10% caloric needs. - IR consult for PEG - Continue tube feeds and free water  - Consult dietitian   Aphasia No evidence she comprehends language, and can follow  commands consistently.   Her crying and inability to show sustained cooperation in care is likely a reflection of aphasia.  She was on Precedex  and Depakene  earlier in this hospital stay, but these were stopped due to agitation. - Continue clonazepam  to nightly use - Continue Topamax  as recommended by PM&R 12/31 - Continue gabapentin  (as recommended by PM&R) - Avoid haldol  or benzodiazepines for sedation, as these impair the patient from calling out if she is soiled or in pain   Lupus anticoagulant History of pulmonary embolism - Heparin  infusion for now, will resume DOAC once PEG placed  -Follow-up with rheumatology   UTI Resolved   Anemia Thrombocytopenia Chronic thrombocytopenia.  Seen by Hematology during index hospitalization 2/24 (thrombocytopenia attributed to "nutritional deficiencies") and then she was lost to follow up.  Seen again by hematology this admission, no explanation for thrombocytopenia proposed.  Platelets stable at ~70K - Continue B12, folate, MVI, iron  - Outpatient hematology and rheumatology follow up             Subjective: Currently no changes.    Physical Exam: BP 102/60 (BP Location: Left Arm)   Pulse 94   Temp 98.9 F (37.2 C) (Oral)   Resp 16   Ht 5\' 4"  (1.626 m)   Wt 76.5 kg   SpO2 100%   BMI 28.95 kg/m    Physical Exam  Constitutional: In no distress. Cardiovascular: Normal rate, regular rhythm. No lower extremity edema  Pulmonary: Non labored breathing on room air, no wheezing or rales.   Abdominal: Soft. Normal  bowel sounds. Non distended and non tender Musculoskeletal: Normal range of motion.     Neurological: Awake, but no meaningful speech, Does not follow commands. Does not move extremities voluntarily  Skin: Skin is warm and dry.       Data Reviewed: Glucoses normal, will reduce to once daily No other new labs   Family Communication: Spoke to mother yesterday to discuss PEG tube plans      Disposition: Status is:  Inpatient Admitted with massive stroke  Now with severe physical and verbal deficits.  Will need PEG placement.  After PEG placement,  rehab       Author: Joette Mustard, MD 03/19/2023 6:26 PM  For on call review [email or web address].

## 2023-03-19 NOTE — Plan of Care (Signed)
 Pt crying out most of the night. Provider paged to obtain intervention to help with insomnia. PRN melatonin started. Pt was bale to take a short nap after this but woke up again with crying out loud and attempting to get OOB. PRN pain medication given with desirable outcome.    Problem: Education: Goal: Knowledge of disease or condition will improve Outcome: Progressing   Problem: Self-Care: Goal: Ability to participate in self-care as condition permits will improve Outcome: Progressing   Problem: Activity: Goal: Risk for activity intolerance will decrease Outcome: Progressing   Problem: Pain Management: Goal: General experience of comfort will improve Outcome: Progressing   Problem: Safety: Goal: Ability to remain free from injury will improve Outcome: Progressing   Problem: Skin Integrity: Goal: Risk for impaired skin integrity will decrease Outcome: Progressing

## 2023-03-19 NOTE — Progress Notes (Signed)
 PHARMACY - ANTICOAGULATION CONSULT NOTE  Pharmacy Consult: Eliquis  >> IV Heparin  Indication: Lupus AC and endoluminal thrombus   No Known Allergies  Patient Measurements: Height: 5\' 4"  (162.6 cm) Weight: 76.5 kg (168 lb 10.4 oz) IBW/kg (Calculated) : 54.7 Heparin  Dosing Weight: 72 kg  Vital Signs: Temp: 98.9 F (37.2 C) (01/15 1600) Temp Source: Oral (01/15 1600) BP: 102/60 (01/15 1600) Pulse Rate: 94 (01/15 1600)  Labs: Recent Labs    03/18/23 0518 03/19/23 0559 03/19/23 1651  HGB 12.1 11.9*  --   HCT 36.3 35.0*  --   PLT 121* 106*  --   APTT  --   --  82*  HEPARINUNFRC  --   --  0.63  CREATININE 0.80 0.76  --     Estimated Creatinine Clearance: 108.5 mL/min (by C-G formula based on SCr of 0.76 mg/dL).   Medical History: Past Medical History:  Diagnosis Date   Bacterial vaginitis 07/10/2016   Candida vaginitis 07/24/2016   Headache in pregnancy, antepartum, third trimester 07/24/2016   Kell isoimmunization during pregnancy     Assessment: 22 YOF presented with acute CVA and MRI also shows endoluminal thrombus.  Patient has a history of unprovoked PE (04/2022) treated with Eliquis  and no longer on anticoagulation. Hx of thrombocytopenia. Patient was transitioned from IV heparin  to Eliquis  earlier this admission. Pharmacy consulted to transition Eliquis  back to IV heparin  in anticipation of PEG tube placement.  Last dose of Eliquis  1/14 @ 2153. Eliquis  will be affecting heparin  level so will utilize aPTT for monitoring until levels correlate.  Heparin  level 0.63 and aPTT 82 sec. Both therapeutic - appear to be close to correlating. No bleeding noted.  Goal of Therapy:  Heparin  level 0.3-0.7 units/ml aPTT 66-102 seconds Monitor platelets by anticoagulation protocol: Yes   Plan:  Continue heparin  gtt at 1150 units/hr Will f/u a.m aPTT and heparin  level F/U PEG tube placement (tentatively 1/17) and ability to resume Eliquis    Enrigue Harvard, PharmD, BCPS Please see  amion for complete clinical pharmacist phone list 03/19/2023 5:32 PM

## 2023-03-19 NOTE — Consult Note (Signed)
Chief Complaint: Patient was seen in consultation today for dysphagia   Procedure: Percutaneous gastrostomy tube placement   Referring Physician(s): Dr. Joen Laura  Supervising Physician: Simonne Come  Patient Status: Jacqueline [name]  History of Present Illness: Jacqueline [name] is a 23 y.o. female with a history of thrombocytopenia, post-partum hemorrhage, PE (off Eliquis since 08/2022), and positive lupus AC who presented to the [facility] ED with stroke. 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 She underwent mechanical thrombectomy in IR on 02/21/23 with Dr. Corliss Skains with progressive worsening stenosis and subsequent complete closure of the left inferior division of MCA. Patient was transferred out of ICU after 6 days. MR Brain on 02/27/23 revealed extension of infarcts in multiple lobes. IR consulted for g-tube placement.   Patient appears mildly distressed on arrival to room. Nurse paged. Currently on Eliquis. NG tube in place. WBC WNL. VSS. Case reviewed with Dr. Milford Cage.   Code Status: Full code per pts mother  Past Medical History:  Diagnosis Date   Bacterial vaginitis 07/10/2016   Candida vaginitis 07/24/2016   Headache in pregnancy, antepartum, third trimester 07/24/2016   Kell isoimmunization during pregnancy     Past Surgical History:  Procedure Laterality Date   CESAREAN SECTION N/A 09/04/2016   Procedure: CESAREAN SECTION;  Surgeon: Levie Heritage, DO;  Location: [facility];  Service: Obstetrics;  Laterality: N/A;   CESAREAN SECTION Bilateral    IR CT HEAD LTD  02/21/2023   IR PERCUTANEOUS ART THROMBECTOMY/INFUSION INTRACRANIAL INC DIAG ANGIO  02/21/2023   RADIOLOGY WITH ANESTHESIA N/A 02/21/2023   Procedure: IR WITH ANESTHESIA;  Surgeon: Julieanne Cotton, MD;  Location: [facility] OR;  Service: Radiology;  Laterality: N/A;    Allergies: Patient has no known allergies.  Medications: Prior to Admission medications   Medication Sig Start Date End Date  Taking? Authorizing Provider  acetaminophen (TYLENOL) 500 MG tablet Take 500 mg by mouth every 6 (six) hours as needed for moderate pain (pain score 4-6).   Yes [provider]  ibuprofen (ADVIL) 200 MG tablet Take 200-600 mg by mouth every 6 (six) hours as needed for moderate pain (pain score 4-6).   Yes [provider]  apixaban (ELIQUIS) 5 MG TABS tablet Take apixaban 10 mg (2 tabs) twice daily until [facility] Feb 14 then reduce to apixaban 5 mg (1 tab) twice daily Patient not taking: Reported on 02/22/2023 04/14/22   Alberteen Sam, MD     Family History  Problem Relation Age of Onset   Hypertension Maternal Grandmother    Diabetes Maternal Grandmother    Cancer Neg Hx     Social History   Socioeconomic History   Marital status: Single    Spouse name: Not on file   Number of children: Not on file   Years of education: Not on file   Highest education level: Not on file  Occupational History   Not on file  Tobacco Use   Smoking status: Some Days    Types: Cigars   Smokeless tobacco: Never   Tobacco comments:    1 B&M per day  Vaping Use   Vaping status: Never Used  Substance and Sexual Activity   Alcohol use: Yes   Drug use: No   Sexual activity: Yes    Partners: Male    Birth control/protection: None  Other Topics Concern   Not on file  Social History Narrative   [phone]- patient's confidential cell   Social Drivers of Health  Financial Resource Strain: Not on File (06/21/2021)   Received from [employer], [employer]    Financial Resource Strain: 0  Food Insecurity: No Food Insecurity (03/07/2023)   Hunger Vital Sign    Worried About Running Out of Food in the Last Year: Never true    Ran Out of Food in the Last Year: Never true  Transportation Needs: Patient Unable To Answer (02/21/2023)   PRAPARE - Transportation    Lack of Transportation (Medical): Patient unable to answer    Lack of Transportation (Non-Medical):  Patient unable to answer  Physical Activity: Not on File (06/21/2021)   Received from [place], [place]   Physical Activity    Physical Activity: 0  Stress: Not on File (06/21/2021)   Received from [facility], [place]   Stress    Stress: 0  Social Connections: Not on File (11/16/2022)   Received from [employer]    Connectedness: 0    Review of Systems  Reason unable to perform ROS: Patient non-verbal.    Vital Signs: BP 104/65 (BP Location: Left Arm)   Pulse 80   Temp 98.9 F (37.2 C) (Oral)   Resp 15   Ht 5\' 4"  (1.626 m)   Wt 168 lb 10.4 oz (76.5 kg)   SpO2 99%   BMI 28.95 kg/m    Physical Exam Vitals reviewed.  Constitutional:      Appearance: Normal appearance.  HENT:     Head: Normocephalic and atraumatic.     Mouth/Throat:     Mouth: Mucous membranes are moist.     Pharynx: Oropharynx is clear.  Cardiovascular:     Rate and Rhythm: Normal rate and regular rhythm.     Heart sounds: Normal heart sounds.  Pulmonary:     Effort: Pulmonary effort is normal.     Breath sounds: Normal breath sounds.  Abdominal:     General: Abdomen is flat.     Palpations: Abdomen is soft.  Musculoskeletal:        General: Normal range of motion.     Cervical back: Normal range of motion.  Skin:    General: Skin is warm and dry.  Neurological:     Comments: No intelligible verbalization. Appears to have limited comprehension. Attempts to follow visual command to open mouth     Imaging: CT ABDOMEN WO CONTRAST Result Date: 03/18/2023 CLINICAL DATA:  dysphagia.  Gastrostomy tube evaluation EXAM: CT ABDOMEN WITHOUT CONTRAST TECHNIQUE: Multidetector CT imaging of the abdomen was performed following the standard protocol without IV contrast. RADIATION DOSE REDUCTION: This exam was performed according to the departmental dose-optimization program which includes automated exposure control, adjustment of the mA and/or kV according to patient size and/or use of iterative  reconstruction technique. COMPARISON:  KUB, 03/03/2023.  CTA chest, 04/08/2022. FINDINGS: Lower chest: No acute abnormality. Hepatobiliary: Normal noncontrast appearance of the liver without focal abnormality. No gallstones, gallbladder wall thickening, or biliary dilatation. Pancreas: No pancreatic ductal dilatation or surrounding inflammatory changes. Spleen: Normal in size without focal abnormality. Adrenals/Urinary Tract: Adrenal glands are unremarkable. Kidneys are normal, without renal calculi, focal lesion, or hydronephrosis. Stomach/Bowel: Stomach is decompressed and within normal limits. Small bore enteric feeding tube, with tube tip within stomach. Interposition of the splenic flexure of the LEFT upper quadrant imaged portions of the small bowel and colon are nondilated. Appendix is outside the field of view on this evaluation. No evidence of bowel wall thickening, distention, or inflammatory changes. Vascular/Lymphatic: No significant  vascular findings are present. No enlarged abdominal or pelvic lymph nodes. Other: No abdominal wall hernia or abnormality. Musculoskeletal: No acute osseous findings. IMPRESSION: 1. Gastric position of small-bore enteric feeding tube. Consider advancement if transpyloric positioning is desired. 2. Intervening viscera with the splenic flexure and distal transverse colon overlying the gastric body and antrum. Anatomy is equivocal for percutaneous gastrostomy placement, with a trial of contrast opacification of colon and insufflation recommended. Final decision making is reserved for the performing physician Roanna Banning, MD [facility] Radiology Electronically Signed   By: Roanna Banning M.D.   On: 03/18/2023 13:50   VAS [place] TRANSCRANIAL DOPPLER W BUBBLES Result Date: 03/11/2023  Transcranial Doppler with Bubble Patient Name:  [facility] A Keasling  Date of Exam:   03/07/2023 Medical Rec #: [id number]         Accession #:    [id number]  Date of Birth: [birth year]         Patient Gender: F Patient Age:   26 years Exam Location:  [facility] Procedure:      VAS [place] TRANSCRANIAL DOPPLER W BUBBLES Referring Phys: Lanae Boast --------------------------------------------------------------------------------  Indications: Stroke. Comparison Study: None. Performing Technologist: Shona Simpson  Examination Guidelines: A complete evaluation includes B-mode imaging, spectral Doppler, color Doppler, and power Doppler as needed of all accessible portions of each vessel. Bilateral testing is considered an integral part of a complete examination. Limited examinations for reoccurring indications may be performed as noted.  Summary:  A vascular evaluation was performed. The left middle cerebral artery was studied. An IV was inserted into the patient's right Cephalic Vein. Verbal informed consent was obtained.  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  Diagnosing physician: Delia Heady MD Electronically signed by Delia Heady MD on 03/11/2023 at 4:46:47 PM.    Final    CT HEAD WO CONTRAST ( ) Result Date: 03/03/2023 CLINICAL DATA:  Stroke, follow up. EXAM: CT HEAD WITHOUT CONTRAST TECHNIQUE: Contiguous axial images were obtained from the base of the skull through the vertex without intravenous contrast. RADIATION DOSE REDUCTION: This exam was performed according to the departmental dose-optimization program which includes automated exposure control, adjustment of the mA and/or kV according to patient size and/or use of iterative reconstruction technique. COMPARISON:  Head CT 03/01/2023. FINDINGS: Brain: Continued evolution of the infarcts in the posterior left MCA territory and left ACA-MCA border zone. No acute hemorrhage or significant mass effect. Unchanged subcortical hypoattenuation deep to the right central sulcus. No hydrocephalus or extra-axial  collection. No mass effect or midline shift. 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 Skull: No calvarial fracture or suspicious bone lesion. Skull base is unremarkable. Sinuses/Orbits: No acute finding. Other: None. IMPRESSION: 1. Continued evolution of the infarcts in the posterior left MCA territory and left ACA-MCA border zone. No acute hemorrhage or significant mass effect. 2. 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 Electronically Signed   By: Orvan Falconer M.D.   On: 03/03/2023 16:57   DG Abd 1 View Result Date: 03/03/2023 CLINICAL DATA:  Abdominal pain EXAM: ABDOMEN - 1 VIEW COMPARISON:  02/28/2023 FINDINGS: Mild diffuse gaseous distention of the colon. No small bowel dilatation. No suspicious calcifications or mass effect. Unchanged position  of Dobbhoff tube with tip located either within the distal stomach or proximal duodenum. IMPRESSION: 1. Mild diffuse gaseous distention of the colon, similar to prior examination. No small bowel dilatation to indicate ileus or obstruction. 2. Dobbhoff tube unchanged in position, terminating in the distal stomach or proximal duodenum. Electronically Signed   By: Acquanetta Belling M.D.   On: 03/03/2023 13:00   CT HEAD WO CONTRAST ( ) Result Date: 03/01/2023 CLINICAL DATA:  23 year old female status post code stroke presentation on 02/21/2023 with left MCA occlusion, infarcts. Evidence of progressive ischemia on MRI 02/27/2023. Subsequent encounter. EXAM: CT HEAD WITHOUT CONTRAST TECHNIQUE: Contiguous axial images were obtained from the base of the skull through the vertex without intravenous contrast. RADIATION DOSE REDUCTION: This exam was performed according to the departmental dose-optimization program which includes automated exposure control, adjustment of the  mA and/or kV according to patient size and/or use of iterative reconstruction technique. COMPARISON:  Brain MRI 02/27/2023, head CT 02/26/2023, and earlier. FINDINGS: Brain: Confluent left hemisphere cytotoxic edema maximal in the middle and posterior left MCA territory is stable. Patchy additional anterior left MCA, right superior frontal gyrus infarcts are stable. No hemorrhagic transformation. Intracranial mass effect with 2-3 mm of rightward midline shift is stable. No ventriculomegaly. Basilar cisterns are stable, remain patent. Stable noncontrast CT appearance of the brainstem and cerebellum. Vascular: Hyperdense left MCA on series 6, image 37. Skull: Intact, negative. Sinuses/Orbits: Visualized paranasal sinuses and mastoids are stable and well aerated. Other: Right nasoenteric tube in place. No acute orbit or scalp soft tissue finding. IMPRESSION: 1. Stable left greater than right MCA territory cytotoxic edema. No malignant hemorrhagic transformation. No new vascular territory involvement from the recent MRI. 2. Stable mild intracranial mass effect with 2-3 mm of rightward midline shift. 3. 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 Electronically Signed   By: Odessa Fleming M.D.   On: 03/01/2023 06:26   DG Abd Portable 1V Result Date: 02/28/2023 CLINICAL DATA:  Feeding tube placement EXAM: PORTABLE ABDOMEN - 1 VIEW COMPARISON:  X-ray earlier 02/28/2023. FINDINGS: Interval removal of the previous NG tube. Dobbhoff tube seen with the tip crossing to the right and could be along the extreme distal stomach or very proximal duodenum. Limited x-ray for tube placement IMPRESSION: Dobbhoff tube seen with the tip crossing to the right and could be along the extreme distal stomach or very proximal duodenum. Electronically Signed   By: Karen Kays M.D.   On: 02/28/2023 13:25   DG Abd 1 View Result Date: 02/28/2023 CLINICAL DATA:  NG tube placement EXAM: ABDOMEN - 1 VIEW COMPARISON:  Earlier 02/28/2023.  FINDINGS: Limited x-ray for tube placement of the upper abdomen has a tip overlying the upper stomach but side hole above the GE junction. Recommend this be advanced further into the stomach. Minimal visualization of bowel loops in the upper abdomen. IMPRESSION: Limited x-ray for tube placement has tip overlying the upper stomach but side hole above the GE junction. Recommend this be advanced further into the stomach. Electronically Signed   By: Karen Kays M.D.   On: 02/28/2023 13:24   DG Abd 1 View Result Date: 02/28/2023 CLINICAL DATA:  Feeding tube placement. EXAM: ABDOMEN - 1 VIEW COMPARISON:  Abdominal x-ray dated February 26, 2023. FINDINGS: Enteric tube tip just inside the stomach with the distal side port in the distal esophagus. Normal bowel gas pattern. No acute osseous abnormality. IMPRESSION: 1. Enteric tube tip just inside the stomach with the distal side port in the  distal esophagus. Recommend advancement. Electronically Signed   By: Obie Dredge M.D.   On: 02/28/2023 10:41   MR BRAIN WO CONTRAST Result Date: 02/27/2023 CLINICAL DATA:  Stroke, follow-up EXAM: MRI HEAD WITHOUT CONTRAST TECHNIQUE: Multiplanar, multiecho pulse sequences of the brain and surrounding structures were obtained without intravenous contrast. COMPARISON:  02/22/2023 MRI head, 02/26/2023 CT head FINDINGS: Brain: Increased size of areas of restricted diffusion and additional new areas of involvement in the left posterior frontal and parietal lobes (series 2, image 39, compared to series 2, image 39 on the 02/22/2023 exam). Additional increased involvement is also noted in the anterior left frontal lobe (series 2, images 26-36), and right frontal lobe in the watershed territory (series 2, image 37). New restricted diffusion with ADC correlate in the left midbrain and cerebral peduncle, extending into the thalamus and posterior limb of the internal capsule (series 2, images 19-23). Additional punctate acute infarcts in  the left occipital lobe (series 2, image 22), left frontal lobe (series 2, image 44), and medial left parietal lobe (series 2, images 40 and 42). These areas are associated with increased T2 hyperintense signal, with gyral swelling and associated mass effect from the larger areas in the left posterior frontal, left temporal, and left parietal lobe, but no more than 3 mm of left-to-right midline shift. On susceptibility weighted imaging, susceptibility in the left MCA is likely related to thrombus, with additional new involvement of a more posterior left MCA branch (series 7, image 59). More punctate foci of hemosiderin deposition are associated with posterior right frontal lobe infarct, left frontal lobe infarct, and posterior left temporal lobe infarct (series 7, images 49, 65, 71, and 72), which may reflect microhemorrhage versus thrombus. No evidence of hemorrhagic conversion. The basilar cisterns remain patent. Unchanged low lying cerebellar tonsils, which extend 2 mm past the foramen magnum, unchanged pituitary within normal limits. Suspected hypodensity on the 10/14/2023 CT head in the right frontal lobe and right cerebellum was artifactual. Vascular: Normal proximal arterial flow voids. Loss of some of the more distal left MCA flow voids. Skull and upper cervical spine: Normal marrow signal. Sinuses/Orbits: Mucosal thickening in the maxillary sinuses and ethmoid air cells. No acute finding in the orbits. Other: Fluid in the left-greater-than-right mastoid air cells. IMPRESSION: 1. Increased acute infarcts in the left posterior frontal and parietal lobes, anterior left frontal lobe, and right frontal lobe, and additional punctate acute infarcts in the left occipital lobe, left frontal lobe, and medial left parietal lobe. 2. New restricted diffusion in the left midbrain, cerebral peduncle, thalamus, and posterior limb of the internal capsule may reflect early changes of wallerian degeneration. 3. Mildly  increased mass effect from the larger areas in the left posterior frontal, left temporal, and left parietal lobe, with no more than 3 mm of left-to-right midline shift. 4. Susceptibility in the left MCA is concerning for thrombus, with additional new involvement of a more posterior left MCA branch. More punctate foci of hemosiderin deposition are associated with the aforementioned infarcts, which may reflect microhemorrhage versus thromboemboli. No evidence of hemorrhagic conversion. These results will be called to the ordering clinician or representative by the Radiologist Assistant, and communication documented in the PACS or [employer]. Electronically Signed   By: Wiliam Ke M.D.   On: 02/27/2023 20:22   EEG adult Result Date: 02/27/2023 Charlsie Quest, MD     02/27/2023  7:31 AM Patient Name: MARYELA NELLUM MRN: [id number] Epilepsy Attending: Charlsie Quest Referring Physician/Provider: Roda Shutters  Scheryl Marten, MD Date: 02/26/2023 Duration: 33.24 mins Patient history: 22yo F with L MCA stroke and ams. EE to evaluate for seizure Level of alertness: comatose/ lethargic AEDs during EEG study: VPA, Clonazepam Technical aspects: This EEG study was done with scalp electrodes positioned according to the 10-20 International system of electrode placement. Electrical activity was reviewed with band pass filter of 1-70Hz , sensitivity of 7 uV/mm, display speed of 72mm/sec with a 60Hz  notched filter applied as appropriate. EEG data were recorded continuously and digitally stored.  Video monitoring was available and reviewed as appropriate. Description: EEG showed continuous generalized 3 to 6 Hz theta-delta slowing with overriding 15 to 18 Hz beta activity distributed symmetrically and diffusely. Hyperventilation and photic stimulation were not performed.   ABNORMALITY - Continuous slow, generalized - Excessive beta, generalized IMPRESSION: This study is suggestive of severe diffuse encephalopathy likely related to  sedation. No seizures or epileptiform discharges were seen throughout the recording. Priyanka Annabelle Harman   CT HEAD WO CONTRAST ( ) Result Date: 02/26/2023 CLINICAL DATA:  Stroke, follow-up EXAM: CT HEAD WITHOUT CONTRAST TECHNIQUE: Contiguous axial images were obtained from the base of the skull through the vertex without intravenous contrast. RADIATION DOSE REDUCTION: This exam was performed according to the departmental dose-optimization program which includes automated exposure control, adjustment of the mA and/or kV according to patient size and/or use of iterative reconstruction technique. COMPARISON:  02/23/2023 CT head FINDINGS: Brain: Increased hypodensity in known infarcts in the posterior left MCA territory and right posterior frontal lobe (series 4, image 23), with increased area of hypodensity in the anterior left temporal lobe (series 4, image 17), which was previously a smaller area of hypodensity. New hypodensity suspected in the anterior inferior right frontal lobe (series 4, image 14 and series 6, image 21). Additional possible hypodensity in the right cerebellar hemisphere (series 4, image 8), although this area is prone to artifact. No definite acute hemorrhage. No mass or midline shift. No hydrocephalus. Vascular: Redemonstrated hyperdense left M2 (series 4, image 11). No other hyperdense vessel. Skull: Negative for fracture or focal lesion. Sinuses/Orbits: No acute finding. Other: The mastoid air cells are well aerated. IMPRESSION: 1. 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 No evidence of frank hemorrhagic transformation. 2. New hypodensity suspected in the anterior inferior right frontal lobe and right cerebellum, which could represent additional acute infarct, although these areas are prone to artifact. Consider MRI for further evaluation. These results will be called to the ordering clinician or  representative by the Radiologist Assistant, and communication documented in the PACS or [employer]. Electronically Signed   By: Wiliam Ke M.D.   On: 02/26/2023 23:40   DG Abd Portable 1V Result Date: 02/26/2023 CLINICAL DATA:  NG placement. EXAM: PORTABLE ABDOMEN - 1 VIEW COMPARISON:  Abdominal radiograph dated 02/24/2023. FINDINGS: Enteric tube with tip in the epigastric area likely in the body of the stomach. No bowel dilatation noted in the visualized abdomen. IMPRESSION: Enteric tube with tip in the body of the stomach. Electronically Signed   By: Elgie Collard M.D.   On: 02/26/2023 15:40   VAS [place] LOWER EXTREMITY VENOUS (DVT) Result Date: 02/26/2023  Lower Venous DVT Study Patient Name:  [facility] A Cardy  Date of Exam:   02/26/2023 Medical Rec #: [id number]         Accession #:    [id number] Date of Birth: 04/22/2000         Patient Gender:  F Patient Age:   27 years Exam Location:  [facility] Procedure:      VAS [place] LOWER EXTREMITY VENOUS (DVT) Referring Phys: Dewitt Hoes DE LA TORRE --------------------------------------------------------------------------------  Indications: Stroke.  Risk Factors: Immobility past pregnancy. Limitations: Patient positioning. Comparison Study: No significant changes seen since prior exam 04/08/22 Performing Technologist: Shona Simpson  Examination Guidelines: A complete evaluation includes B-mode imaging, spectral Doppler, color Doppler, and power Doppler as needed of all accessible portions of each vessel. Bilateral testing is considered an integral part of a complete examination. Limited examinations for reoccurring indications may be performed as noted. The reflux portion of the exam is performed with the patient in reverse Trendelenburg.  +---------+---------------+---------+-----------+----------+--------------+ RIGHT    CompressibilityPhasicitySpontaneityPropertiesThrombus Aging  +---------+---------------+---------+-----------+----------+--------------+ CFV      Full           Yes      Yes                                 +---------+---------------+---------+-----------+----------+--------------+ SFJ      Full                                                        +---------+---------------+---------+-----------+----------+--------------+ FV Prox  Full                                                        +---------+---------------+---------+-----------+----------+--------------+ FV Mid   Full                                                        +---------+---------------+---------+-----------+----------+--------------+ FV DistalFull                                                        +---------+---------------+---------+-----------+----------+--------------+ PFV      Full                                                        +---------+---------------+---------+-----------+----------+--------------+ POP      Full           Yes      Yes                                 +---------+---------------+---------+-----------+----------+--------------+ PTV      Full                                                        +---------+---------------+---------+-----------+----------+--------------+  PERO     Full                                                        +---------+---------------+---------+-----------+----------+--------------+   +---------+---------------+---------+-----------+----------+--------------+ LEFT     CompressibilityPhasicitySpontaneityPropertiesThrombus Aging +---------+---------------+---------+-----------+----------+--------------+ CFV      Full           Yes      Yes                                 +---------+---------------+---------+-----------+----------+--------------+ SFJ      Full                                                         +---------+---------------+---------+-----------+----------+--------------+ FV Prox  Full                                                        +---------+---------------+---------+-----------+----------+--------------+ FV Mid   Full                                                        +---------+---------------+---------+-----------+----------+--------------+ FV DistalFull                                                        +---------+---------------+---------+-----------+----------+--------------+ PFV      Full                                                        +---------+---------------+---------+-----------+----------+--------------+ POP      Full           Yes      Yes                                 +---------+---------------+---------+-----------+----------+--------------+ PTV      Full                                                        +---------+---------------+---------+-----------+----------+--------------+ PERO     Full                                                        +---------+---------------+---------+-----------+----------+--------------+  Summary: BILATERAL: - No evidence of deep vein thrombosis seen in the lower extremities, bilaterally. -No evidence of popliteal cyst, bilaterally.   *See table(s) above for measurements and observations. Electronically signed by Gerarda Fraction on 02/26/2023 at 11:56:19 AM.    Final    IR PERCUTANEOUS ART THROMBECTOMY/INFUSION INTRACRANIAL INC DIAG ANGIO Result Date: 02/25/2023 INDICATION: Acute onset of right-sided weakness and aphasia. 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 EXAM: 1. EMERGENT LARGE VESSEL OCCLUSION THROMBOLYSIS (anterior CIRCULATION) COMPARISON:  CT angiogram of the head and neck of February 24, 2023. MEDICATIONS: No antibiotic was administered within 1 hour of the procedure. ANESTHESIA/SEDATION: General anesthesia. CONTRAST:   Omnipaque 300 approximately 110 mL. FLUOROSCOPY TIME:  Fluoroscopy Time: 21 minutes 42 seconds (1597 mGy). COMPLICATIONS: None immediate. TECHNIQUE: Following a full explanation of the procedure along with the potential associated complications, an informed witnessed consent was obtained. The risks of intracranial hemorrhage of 10%, worsening neurological deficit, ventilator dependency, death and inability to revascularize were all reviewed in detail with the patient's mother. The patient was then put under general anesthesia by the Department of Anesthesiology at [facility]. The right groin was prepped and draped in the usual sterile fashion. Thereafter using modified Seldinger technique, transfemoral access into the right common femoral artery was obtained without difficulty. Over an 0.035 inch guidewire an 8 French 25 cm Pinnacle sheath was inserted. Through this, and also over an 0.035 inch guidewire a combination of a 95 cm 087 balloon guide catheter with a 125 cm 6 [place] Berenstein support catheter was advanced to the aortic arch region, and selectively positioned in the left common carotid artery and then distally in the left internal carotid artery. FINDINGS: The left common carotid arteriogram demonstrates the left external carotid artery and its major branches to be widely patent. The left internal carotid artery at the bulb to the cranial skull base demonstrates wide patency. The petrous, the cavernous and the supraclinoid left ICA are widely patent. The left middle cerebral artery demonstrates patency of the left M1 segment and the superior division. The inferior division demonstrates occlusion of a side branch from the mid M2 segment supplying the angular region. The left anterior cerebral artery opacifies into the capillary and venous phases. Prompt filling via the anterior communicating artery of the right anterior cerebral A2 segment and distally is noted. Suggestion of a fenestration of  the anterior communicating artery is suspect. PROCEDURE: Through the balloon guide catheter in the distal left ICA, a 055 Zoom aspiration catheter with an 021 160 cm microcatheter combination was advanced over an 018 inch micro guidewire with a moderate J configuration to the supraclinoid left ICA. The micro guidewire followed by the microcatheter was then advanced through the occluded branch of the inferior division in the M2 region more distally to the M3 region followed by the microcatheter. The guidewire was removed. Aspiration was obtained from the hub of the microcatheter. At this time the 055 Zoom aspiration catheter was advanced to engage the occluded branch of the inferior division. The micro guidewire and the microcatheter were removed. Constant aspiration was then applied at the hub of the aspiration catheter for approximately a minute and a half. Thereafter with proximal flow arrest, the aspiration catheter was removed. Control arteriogram performed following reversal of flow arrest in the left ICA demonstrated no significant change in the occluded branch. Also noted at this time was moderate spasm of the M1 segment also the distal cervical  left ICA which responded to 3 aliquots of 75 mcg of nitroglycerin intra-arterially. A second pass was made again using the above combination. Having gained access into the previously occluded branch in the M3 region and placement of a micro guidewire, good aspiration was obtained from the hub of the microcatheter. A gentle contrast injection demonstrated safe positioning of the tip of the microcatheter which was then connected to continuous heparinized saline infusion. A 4 mm x 40 mm Solitaire X retrieval device was then deployed in the usual manner with the proximal portion just proximal to the occluded portion of the vessel concern. The 055 Zoom aspiration catheter was now advanced to engage the origin of the occluded branch in the inferior division. Following a  minute and a half of aspiration at the hub of the 055 Zoom aspiration catheter, the combination was retrieved and removed. Following flow reversal control arteriogram performed demonstrated now near complete revascularization of the occluded vessel with a TICI 3 revascularization. A control arteriogram performed a few minutes later demonstrated progressive slow flow and occlusion of the previously occluded branch, and also the proximal main trunk. There was intermittent partial response to 25 mcg doses of nitroglycerin, and 2.5 mg of verapamil. In anticipation of possible placement of a rescue stent, a CT of the brain was obtained which demonstrated hypoattenuation in the left parietal cortical and subcortical area with mild mass effect on the anterior horn of the left lateral ventricle. Following further discussion with the stroke neurologist, the option of endovascular revascularization with stent placement was discussed with the patient's family member by the stroke neurologist. The procedure with the potential risk of hemorrhage, use of dual antiplatelets to protect the stent from occlusion and possibility of retrograde closure of the left MCA with placement of the stent were reviewed with the family. The family decided not to proceed with stent placement. A final arteriogram performed in the left internal carotid artery through the balloon guide demonstrated moderate spasm in the left middle cerebral artery M1 segment with attempts at revascularization of the previously occluded side branch of the inferior division. The balloon guide was removed. An 8 French Angio-Seal closure device was deployed at the skin entry site. Distal pulses were all present in both feet. Patient was left intubated due to her medical condition and to protect her airway. She was then transferred to the neuro ICU for post revascularization care. IMPRESSION: Status post endovascular complete revascularization of occluded angular branch of  the inferior division of the left middle cerebral artery with 1 pass with contact aspiration, 1 pass with contact aspiration and a 4 mm x 40 mm Solitaire X retrieval device with a TICI 3 revascularization. Reclosure of the recanalized branch of the inferior division due to probable severe sustained vasospasm of unknown etiology. PLAN: As per referring MD. Electronically Signed   By: Julieanne Cotton M.D.   On: 02/25/2023 08:11   IR CT Head Ltd Result Date: 02/25/2023 INDICATION: Acute onset of right-sided weakness and aphasia. 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 EXAM: 1. EMERGENT LARGE VESSEL OCCLUSION THROMBOLYSIS (anterior CIRCULATION) COMPARISON:  CT angiogram of the head and neck of February 24, 2023. MEDICATIONS: No antibiotic was administered within 1 hour of the procedure. ANESTHESIA/SEDATION: General anesthesia. CONTRAST:  Omnipaque 300 approximately 110 mL. FLUOROSCOPY TIME:  Fluoroscopy Time: 21 minutes 42 seconds (1597 mGy). COMPLICATIONS: None immediate. TECHNIQUE: Following a full explanation of the procedure along with the potential associated complications,  an informed witnessed consent was obtained. The risks of intracranial hemorrhage of 10%, worsening neurological deficit, ventilator dependency, death and inability to revascularize were all reviewed in detail with the patient's mother. The patient was then put under general anesthesia by the Department of Anesthesiology at [facility]. The right groin was prepped and draped in the usual sterile fashion. Thereafter using modified Seldinger technique, transfemoral access into the right common femoral artery was obtained without difficulty. Over an 0.035 inch guidewire an 8 French 25 cm Pinnacle sheath was inserted. Through this, and also over an 0.035 inch guidewire a combination of a 95 cm 087 balloon guide catheter with a 125 cm 6 [place] Berenstein support catheter was advanced to the  aortic arch region, and selectively positioned in the left common carotid artery and then distally in the left internal carotid artery. FINDINGS: The left common carotid arteriogram demonstrates the left external carotid artery and its major branches to be widely patent. The left internal carotid artery at the bulb to the cranial skull base demonstrates wide patency. The petrous, the cavernous and the supraclinoid left ICA are widely patent. The left middle cerebral artery demonstrates patency of the left M1 segment and the superior division. The inferior division demonstrates occlusion of a side branch from the mid M2 segment supplying the angular region. The left anterior cerebral artery opacifies into the capillary and venous phases. Prompt filling via the anterior communicating artery of the right anterior cerebral A2 segment and distally is noted. Suggestion of a fenestration of the anterior communicating artery is suspect. PROCEDURE: Through the balloon guide catheter in the distal left ICA, a 055 Zoom aspiration catheter with an 021 160 cm microcatheter combination was advanced over an 018 inch micro guidewire with a moderate J configuration to the supraclinoid left ICA. The micro guidewire followed by the microcatheter was then advanced through the occluded branch of the inferior division in the M2 region more distally to the M3 region followed by the microcatheter. The guidewire was removed. Aspiration was obtained from the hub of the microcatheter. At this time the 055 Zoom aspiration catheter was advanced to engage the occluded branch of the inferior division. The micro guidewire and the microcatheter were removed. Constant aspiration was then applied at the hub of the aspiration catheter for approximately a minute and a half. Thereafter with proximal flow arrest, the aspiration catheter was removed. Control arteriogram performed following reversal of flow arrest in the left ICA demonstrated no  significant change in the occluded branch. Also noted at this time was moderate spasm of the M1 segment also the distal cervical left ICA which responded to 3 aliquots of 75 mcg of nitroglycerin intra-arterially. A second pass was made again using the above combination. Having gained access into the previously occluded branch in the M3 region and placement of a micro guidewire, good aspiration was obtained from the hub of the microcatheter. A gentle contrast injection demonstrated safe positioning of the tip of the microcatheter which was then connected to continuous heparinized saline infusion. A 4 mm x 40 mm Solitaire X retrieval device was then deployed in the usual manner with the proximal portion just proximal to the occluded portion of the vessel concern. The 055 Zoom aspiration catheter was now advanced to engage the origin of the occluded branch in the inferior division. Following a minute and a half of aspiration at the hub of the 055 Zoom aspiration catheter, the combination was retrieved and removed. Following flow reversal control arteriogram  performed demonstrated now near complete revascularization of the occluded vessel with a TICI 3 revascularization. A control arteriogram performed a few minutes later demonstrated progressive slow flow and occlusion of the previously occluded branch, and also the proximal main trunk. There was intermittent partial response to 25 mcg doses of nitroglycerin, and 2.5 mg of verapamil. In anticipation of possible placement of a rescue stent, a CT of the brain was obtained which demonstrated hypoattenuation in the left parietal cortical and subcortical area with mild mass effect on the anterior horn of the left lateral ventricle. Following further discussion with the stroke neurologist, the option of endovascular revascularization with stent placement was discussed with the patient's family member by the stroke neurologist. The procedure with the potential risk of  hemorrhage, use of dual antiplatelets to protect the stent from occlusion and possibility of retrograde closure of the left MCA with placement of the stent were reviewed with the family. The family decided not to proceed with stent placement. A final arteriogram performed in the left internal carotid artery through the balloon guide demonstrated moderate spasm in the left middle cerebral artery M1 segment with attempts at revascularization of the previously occluded side branch of the inferior division. The balloon guide was removed. An 8 French Angio-Seal closure device was deployed at the skin entry site. Distal pulses were all present in both feet. Patient was left intubated due to her medical condition and to protect her airway. She was then transferred to the neuro ICU for post revascularization care. IMPRESSION: Status post endovascular complete revascularization of occluded angular branch of the inferior division of the left middle cerebral artery with 1 pass with contact aspiration, 1 pass with contact aspiration and a 4 mm x 40 mm Solitaire X retrieval device with a TICI 3 revascularization. Reclosure of the recanalized branch of the inferior division due to probable severe sustained vasospasm of unknown etiology. PLAN: As per referring MD. Electronically Signed   By: Julieanne Cotton M.D.   On: 02/25/2023 08:11   ECHOCARDIOGRAM COMPLETE BUBBLE STUDY Result Date: 02/24/2023    ECHOCARDIOGRAM REPORT   Patient Name:   [facility] Ream Date of Exam: 02/24/2023 Medical Rec #:  [id number]        Height:       64.0 in Accession #:    [id number]       Weight:       160.0 lb Date of Birth:  [birth year]        BSA:          1.779 m Patient Age:    22 years         BP:           115/82 mmHg Patient Gender: F                HR:           95 bpm. Exam Location:  Inpatient Procedure: 2D Echo, Cardiac Doppler, Color Doppler and Saline Contrast Bubble            Study Indications:    Stroke  History:        Patient  has prior history of Echocardiogram examinations, most                 recent 04/08/2022.  Sonographer:    Vern Claude Referring Phys: [id number] Malachi Carl STACK  Sonographer Comments: Image acquisition challenging due to uncooperative patient. IMPRESSIONS  1. Left ventricular ejection fraction, by estimation, is 60 to 65%.  The left ventricle has normal function. The left ventricle has no regional wall motion abnormalities. Indeterminate diastolic filling due to E-A fusion.  2. Right ventricular systolic function is normal. The right ventricular size is normal.  3. The mitral valve is normal in structure. No evidence of mitral valve regurgitation. No evidence of mitral stenosis.  4. The aortic valve is normal in structure. Aortic valve regurgitation is not visualized. No aortic stenosis is present.  5. The inferior vena cava is normal in size with greater than 50% respiratory variability, suggesting right atrial pressure of 3 mmHg.  6. Cannot exclude a small PFO. Agitated saline contrast bubble study was positive with shunting observed within 3-6 cardiac cycles suggestive of interatrial shunt. Conclusion(s)/Recommendation(s): Findings consistent with interatrial shunt, increased significantly with valsalva. FINDINGS  Left Ventricle: Left ventricular ejection fraction, by estimation, is 60 to 65%. The left ventricle has normal function. The left ventricle has no regional wall motion abnormalities. The left ventricular internal cavity size was small. There is no left ventricular hypertrophy. Indeterminate diastolic filling due to E-A fusion. Right Ventricle: The right ventricular size is normal. No increase in right ventricular wall thickness. Right ventricular systolic function is normal. Left Atrium: Left atrial size was normal in size. Right Atrium: Right atrial size was normal in size. Pericardium: There is no evidence of pericardial effusion. Mitral Valve: The mitral valve is normal in structure. No evidence of mitral  valve regurgitation. No evidence of mitral valve stenosis. MV peak gradient, 3.4 mmHg. The mean mitral valve gradient is 2.0 mmHg. Tricuspid Valve: The tricuspid valve is normal in structure. Tricuspid valve regurgitation is trivial. No evidence of tricuspid stenosis. Aortic Valve: The aortic valve is normal in structure. Aortic valve regurgitation is not visualized. No aortic stenosis is present. Aortic valve mean gradient measures 4.0 mmHg. Aortic valve peak gradient measures 6.2 mmHg. Aortic valve area, by VTI measures 1.60 cm. Pulmonic Valve: The pulmonic valve was normal in structure. Pulmonic valve regurgitation is mild. No evidence of pulmonic stenosis. Aorta: The aortic root is normal in size and structure. Venous: The inferior vena cava is normal in size with greater than 50% respiratory variability, suggesting right atrial pressure of 3 mmHg. IAS/Shunts: Cannot exclude a small PFO. Agitated saline contrast was given intravenously to evaluate for intracardiac shunting. Agitated saline contrast bubble study was positive with shunting observed within 3-6 cardiac cycles suggestive of interatrial shunt.  LEFT VENTRICLE PLAX 2D LVIDd:         4.90 cm      Diastology LVIDs:         3.10 cm      LV e' medial:    16.90 cm/s LV PW:         0.70 cm      LV E/e' medial:  4.8 LV IVS:        0.60 cm      LV e' lateral:   18.50 cm/s LVOT diam:     1.90 cm      LV E/e' lateral: 4.4 LV SV:         30 LV SV Index:   17 LVOT Area:     2.84 cm  LV Volumes (MOD) LV vol d, MOD A2C: 114.0 ml LV vol d, MOD A4C: 102.0 ml LV vol s, MOD A2C: 38.7 ml LV vol s, MOD A4C: 41.7 ml LV SV MOD A2C:     75.3 ml LV SV MOD A4C:     102.0 ml LV SV  MOD BP:      67.2 ml RIGHT VENTRICLE            IVC RV S prime:     9.79 cm/s  IVC diam: 1.10 cm TAPSE (M-mode): 2.7 cm LEFT ATRIUM             Index        RIGHT ATRIUM          Index LA diam:        2.00 cm 1.12 cm/m   RA Area:     6.95 cm LA Vol (A2C):   21.3 ml 11.97 ml/m  RA Volume:   11.70  ml 6.58 ml/m LA Vol (A4C):   31.1 ml 17.48 ml/m LA Biplane Vol: 28.1 ml 15.79 ml/m  AORTIC VALVE                    PULMONIC VALVE AV Area (Vmax):    2.03 cm     PV Vmax:          0.96 m/s AV Area (Vmean):   1.62 cm     PV Peak grad:     3.7 mmHg AV Area (VTI):     1.60 cm     PR End Diast Vel: 3.89 msec AV Vmax:           125.00 cm/s AV Vmean:          87.100 cm/s AV VTI:            0.186 m AV Peak Grad:      6.2 mmHg AV Mean Grad:      4.0 mmHg LVOT Vmax:         89.40 cm/s LVOT Vmean:        49.800 cm/s LVOT VTI:          0.105 m LVOT/AV VTI ratio: 0.56  AORTA Ao Root diam: 2.80 cm Ao Asc diam:  2.40 cm MITRAL VALVE MV Area (PHT): 6.77 cm    SHUNTS MV Area VTI:   2.71 cm    Systemic VTI:  0.10 m MV Peak grad:  3.4 mmHg    Systemic Diam: 1.90 cm MV Mean grad:  2.0 mmHg MV Vmax:       0.92 m/s MV Vmean:      75.1 cm/s MV Decel Time: 112 msec MV E velocity: 81.20 cm/s MV A velocity: 79.10 cm/s MV E/A ratio:  1.03 Clearnce Hasten Electronically signed by Clearnce Hasten Signature Date/Time: 02/24/2023/12:53:03 PM    Final    DG Abd Portable 1V Result Date: 02/24/2023 CLINICAL DATA:  Feeding tube placement EXAM: PORTABLE ABDOMEN - 1 VIEW COMPARISON:  None Available. FINDINGS: The enteric tube terminates in the stomach, with the distal tip near the expected region of the pylorus. Nonobstructive bowel-gas pattern. Moderate colonic stool burden. The visualized bibasilar lungs clear. No acute osseous abnormality. IMPRESSION: The enteric tube terminates in the distal stomach. Electronically Signed   By: Jacob Moores M.D.   On: 02/24/2023 11:25   DG CHEST PORT 1 VIEW Result Date: 02/23/2023 CLINICAL DATA:  Fever. EXAM: PORTABLE CHEST 1 VIEW COMPARISON:  04/08/2022 FINDINGS: The lungs are clear without focal pneumonia, edema, pneumothorax or pleural effusion. The cardiopericardial silhouette is within normal limits for size. No acute bony abnormality. Telemetry leads overlie the chest. IMPRESSION: No  active disease. Electronically Signed   By: Kennith Center M.D.   On: 02/23/2023 14:59   CT HEAD WO CONTRAST ( ) Result Date:  02/23/2023 CLINICAL DATA:  Right history: Stroke, follow-up. EXAM: CT HEAD WITHOUT CONTRAST TECHNIQUE: Contiguous axial images were obtained from the base of the skull through the vertex without intravenous contrast. RADIATION DOSE REDUCTION: This exam was performed according to the departmental dose-optimization program which includes automated exposure control, adjustment of the mA and/or kV according to patient size and/or use of iterative reconstruction technique. COMPARISON:  Brain MRI 02/22/2023. FINDINGS: Brain: Known fairly extensive acute MCA territory and watershed territory infarcts within the left cerebral hemisphere, not appreciably changed from the prior brain MRI of 02/22/2023. Known small acute right frontoparietal infarcts also again demonstrated. No midline shift or significant mass effect. No evidence of hemorrhagic conversion. No extra-axial fluid collection. No evidence of an intracranial mass. Vascular: Abnormal hyperdensity of a left MCA M2 vessel. Skull: No calvarial fracture or aggressive osseous lesion. Sinuses/Orbits: No orbital mass or acute orbital finding at the imaged levels. No significant paranasal sinus disease at the imaged levels. IMPRESSION: 1. Known acute infarcts within the left greater than right cerebral hemispheres, not appreciably changed in extent from the prior brain MRI of 02/22/2023. No midline shift or significant mass effect. No evidence of hemorrhagic conversion. 2. 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 This is at site of the vascular signal abnormality described on yesterday's MRI. Electronically Signed   By: Jackey Loge D.O.   On: 02/23/2023 13:46   MR BRAIN WO CONTRAST Result Date: 02/22/2023 CLINICAL DATA:  Provided history: Neuro deficit, acute, stroke  suspected. EXAM: MRI HEAD WITHOUT CONTRAST TECHNIQUE: Multiplanar, multiecho pulse sequences of the brain and surrounding structures were obtained without intravenous contrast. COMPARISON:  CT angiogram head/neck and CT perfusion head 02/21/2023. Non-contrast head CT 02/21/2023. FINDINGS: Brain: No age advanced or lobar predominant parenchymal atrophy. Acute inferior division left MCA territory cortical/subcortical infarct centered at the left insula, left temporal lobe and left parietal lobe, spanning 9.4 x 4.1 cm in transaxial dimensions. Fairly numerous additional small scattered acute cortical/subcortical infarcts within the left frontal and parietal lobes (MCA and watershed distribution). 11 mm acute infarct within the left caudate nucleus. Small acute right frontoparietal cortical/subcortical infarcts also present. No evidence of hemorrhagic conversion.  No midline shift. No evidence of an intracranial mass. No extra-axial fluid collection. No midline shift. Vascular: Prominent susceptibility-weighted signal loss along the course of an M2 left middle cerebral artery vessel, which may reflect slow flow or vessel occlusion. Skull and upper cervical spine: Abnormal T1 hypointense marrow signal within the calvarium and within visualized portions of the cervical spine. Sinuses/Orbits: No mass or acute finding within the imaged orbits. Mild mucosal thickening within the left maxillary sinus. Other: Trace fluid within the left mastoid air cells. Impressions 1 and 2 will be called to the ordering clinician or representative by the Radiologist Assistant, and communication documented in the PACS or [employer]. IMPRESSION: 1. Fairly extensive acute left MCA territory and watershed territory infarcts within the left cerebral hemisphere as described. Prominent susceptibility-weighted signal loss along the course of an M2 left middle cerebral artery vessel, which may reflect slow flow within this vessel or vessel  occlusion. 2. Small acute right frontoparietal cortical/subcortical infarcts also present. 3. Abnormal T1 hypointense marrow signal within the calvarium and within visualized portions of the cervical spine. While this finding can reflect a marrow infiltrative process, the most common causes include chronic anemia, smoking and obesity. Electronically Signed   By: Jackey Loge D.O.   On: 02/22/2023 14:29  CT ANGIO HEAD NECK W WO CM W PERF (CODE STROKE) Addendum Date: 02/21/2023 ADDENDUM REPORT: 02/21/2023 12:13 ADDENDUM: Findings on the CT head and CTA were discussed with Dr. Selina Cooley via telephone at 11:59 a.m. Electronically Signed   By: Feliberto Harts M.D.   On: 02/21/2023 12:13   Result Date: 02/21/2023 CLINICAL DATA:  Neuro deficit, acute, stroke suspected EXAM: CT ANGIOGRAPHY HEAD AND NECK CT PERFUSION BRAIN TECHNIQUE: Multidetector CT imaging of the head and neck was performed using the standard protocol during bolus administration of intravenous contrast. Multiplanar CT image reconstructions and MIPs were obtained to evaluate the vascular anatomy. Carotid stenosis measurements (when applicable) are obtained utilizing NASCET criteria, using the distal internal carotid diameter as the denominator. Multiphase CT imaging of the brain was performed following IV bolus contrast injection. Subsequent parametric perfusion maps were calculated using RAPID software. RADIATION DOSE REDUCTION: This exam was performed according to the departmental dose-optimization program which includes automated exposure control, adjustment of the mA and/or kV according to patient size and/or use of iterative reconstruction technique. CONTRAST:  OMNIPAQUE IOHEXOL 350 MG/ML SOLN COMPARISON:  None Available. FINDINGS: CTA NECK FINDINGS Aortic arch: Great vessel origins are patent without significant stenosis. Right carotid system: No evidence of dissection, stenosis (50% or greater) or occlusion. Left carotid system: No  evidence of dissection, stenosis (50% or greater) or occlusion. Vertebral arteries: Left dominant. No evidence of dissection, stenosis (50% or greater) or occlusion. Skeleton: No acute abnormality on limited assessment. Other neck: No acute abnormality on limited assessment. Upper chest: Visualized lung apices are clear. Review of the MIP images confirms the above findings CTA HEAD FINDINGS Anterior circulation: Bilateral intracranial ICAs are patent without significant stenosis. Bilateral ACAs are patent without significant stenosis. Left M1 MCA is patent. 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 Posterior circulation: Bilateral intradural vertebral arteries, basilar artery and bilateral posterior cerebral arteries are patent without proximal hemodynamically significant stenosis. Venous sinuses: As permitted by contrast timing, patent. Review of the MIP images confirms the above findings CT Brain Perfusion Findings: ASPECTS: 7-8 CBF (<30%) Volume: 0mL Perfusion (Tmax>6.0s) volume: 21mL Mismatch Volume: 21mL Infarction Location:None identified by perfusion. Dr. Selina Cooley paged at the time of dictation. IMPRESSION: 1. 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 2. Approximately 21 mL of reported mismatch/penumbra in the left parietotemporal region, which correlates with findings on same day CT head and the above occluded vessel. 3. Given the above findings, the area of possible infarct on CT head is felt to be real. Therefore, the area of mismatch/penumbra may be overestimated. An MRI could better characterize the extent of acute infarct if clinically warranted. Electronically Signed: By: Feliberto Harts M.D. On: 02/21/2023 11:22   CT HEAD CODE STROKE WO CONTRAST Result Date: 02/21/2023 CLINICAL DATA:  Code stroke.  Neuro deficit, acute, stroke suspected EXAM: CT HEAD WITHOUT CONTRAST TECHNIQUE: Contiguous axial images were obtained from the base of the skull through the vertex  without intravenous contrast. RADIATION DOSE REDUCTION: This exam was performed according to the departmental dose-optimization program which includes automated exposure control, adjustment of the mA and/or kV according to patient size and/or use of iterative reconstruction technique. COMPARISON:  CT venogram April 19, 2022. FINDINGS: Brain: Mild loss of gray-Moure differentiation left parietotemporal region with some streak artifact in this region. No evidence of acute hemorrhage, hydrocephalus, extra-axial collection or mass lesion/mass effect. Vascular: No hyperdense vessel identified. Skull: No acute fracture. Sinuses/Orbits: Clear sinuses.  No acute  orbital findings. ASPECTS [facility] Stroke Program Early CT Score) Total score (0-10 with 10 being normal): 7-8 at worst. Dr. Selina Cooley paged for call of report at the time of dictation. IMPRESSION: 1. Mild loss of gray-Rosenstock differentiation left parietotemporal region may represent left MCA territory acute infarct or artifact given streak artifact through this region. Recommend MRI to further evaluate. 2. No acute hemorrhage. Electronically Signed   By: Feliberto Harts M.D.   On: 02/21/2023 11:00    Labs:  CBC: Recent Labs    03/13/23 0739 03/14/23 0715 03/18/23 0518 03/19/23 0559  WBC 5.3 4.5 8.3 8.0  HGB 9.4* 10.0* 12.1 11.9*  HCT 28.6* 30.2* 36.3 35.0*  PLT 78* 77* 121* 106*    COAGS: Recent Labs    02/21/23 1042  INR 1.1  APTT 42*    BMP: Recent Labs    03/13/23 0928 03/14/23 0715 03/18/23 0518 03/19/23 0559  NA 136 138 138 134*  K 3.6 3.7 4.0 3.7  CL 105 105 104 102  CO2 21* 23 23 23   GLUCOSE 96 99 108* 123*  BUN 24* 22* 17 19  CALCIUM 8.9 9.1 9.5 9.3  CREATININE 0.66 0.62 0.80 0.76  GFRNONAA >60 >60 >60 >60    LIVER FUNCTION TESTS: Recent Labs    02/21/23 1042 03/08/23 0634 03/10/23 0623 03/12/23 0557 03/19/23 0559  BILITOT 0.4 0.4 0.4  --  0.5  AST 20 45* 34  --  34  ALT 9 44 31  --  41  ALKPHOS 64 79 76   --  77  PROT 6.9 7.5 7.3  --  7.3  ALBUMIN 3.4* 2.9* 2.8* 2.7* 3.2*    TUMOR MARKERS: No results for input(s): "AFPTM", "CEA", "CA199", "CHROMGRNA" in the last 8760 hours.  Assessment and Plan:  23 y.o. female with a history of thrombocytopenia, post-partum hemorrhage, PE (off Eliquis since 08/2022), and positive lupus AC who presented to the [facility] ED with stroke. 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 She underwent mechanical thrombectomy in IR on 02/21/23 with Dr. Corliss Skains with progressive worsening stenosis and subsequent complete closure of the left inferior division of MCA. Patient was transferred out of ICU after 6 days. MR Brain on 02/27/23 revealed extension of infarcts in multiple lobes. IR consulted for g-tube placement.   -Currently on Eliquis. Dr. Montez Morita agreeable to holding Eliquis and starting on Heparin drip starting 03/19/23 -Will place order to hold Heparin prior to procedure -Will make NPO at midnight on 03/21/23 -KUB and omnipaque ordered for morning of procedure  Plan for gastrostomy tube placement on 03/21/23 in IR  Risks and benefits image guided gastrostomy tube placement was discussed with the patient's mother Dujuana Train) via phone including, but not limited to the need for a barium enema during the procedure, bleeding, infection, peritonitis and/or damage to adjacent structures.  All questions were answered, patient's mother is agreeable to proceed.  Consent signed and in chart.   Thank you for this interesting consult. I greatly enjoyed meeting Atleigh A Senat and look forward to participating in their care. A copy of this report was sent to the requesting provider on this date.  Electronically Signed: Jama Flavors, PA-C 03/19/2023, 9:42 AM   I spent a total of 20 Minutes  in face to face clinical consultation, greater than 50% of which was counseling/coordinating care for gastrostomy tube placement.

## 2023-03-19 NOTE — Plan of Care (Signed)
  Problem: Education: Goal: Knowledge of disease or condition will improve Outcome: Progressing Goal: Knowledge of secondary prevention will improve (MUST DOCUMENT ALL) Outcome: Progressing Goal: Knowledge of patient specific risk factors will improve [name] N/A or DELETE if not current risk factor) Outcome: Progressing   Problem: Ischemic Stroke/TIA Tissue Perfusion: Goal: Complications of ischemic stroke/TIA will be minimized Outcome: Progressing   Problem: Coping: Goal: Will verbalize positive feelings about self Outcome: Progressing Goal: Will identify appropriate support needs Outcome: Progressing   Problem: Health Behavior/Discharge Planning: Goal: Ability to manage health-related needs will improve Outcome: Progressing Goal: Goals will be collaboratively established with patient/family Outcome: Progressing   Problem: Self-Care: Goal: Ability to participate in self-care as condition permits will improve Outcome: Progressing Goal: Verbalization of feelings and concerns over difficulty with self-care will improve Outcome: Progressing Goal: Ability to communicate needs accurately will improve Outcome: Progressing   Problem: Nutrition: Goal: Risk of aspiration will decrease Outcome: Progressing Goal: Dietary intake will improve Outcome: Progressing   Problem: Education: Goal: Knowledge of General Education information will improve Description: Including pain rating scale, medication(s)/side effects and non-pharmacologic comfort measures Outcome: Progressing   Problem: Health Behavior/Discharge Planning: Goal: Ability to manage health-related needs will improve Outcome: Progressing   Problem: Clinical Measurements: Goal: Ability to maintain clinical measurements within normal limits will improve Outcome: Progressing Goal: Will remain free from infection Outcome: Progressing Goal: Diagnostic test results will improve Outcome: Progressing Goal: Cardiovascular  complication will be avoided Outcome: Progressing   Problem: Activity: Goal: Risk for activity intolerance will decrease Outcome: Progressing   Problem: Nutrition: Goal: Adequate nutrition will be maintained Outcome: Progressing   Problem: Coping: Goal: Level of anxiety will decrease Outcome: Progressing   Problem: Elimination: Goal: Will not experience complications related to bowel motility Outcome: Progressing Goal: Will not experience complications related to urinary retention Outcome: Progressing   Problem: Pain Management: Goal: General experience of comfort will improve Outcome: Progressing   Problem: Safety: Goal: Ability to remain free from injury will improve Outcome: Progressing   Problem: Skin Integrity: Goal: Risk for impaired skin integrity will decrease Outcome: Progressing   Problem: Education: Goal: Understanding of CV disease, CV risk reduction, and recovery process will improve Outcome: Progressing Goal: Individualized Educational Video(s) Outcome: Progressing   Problem: Activity: Goal: Ability to return to baseline activity level will improve Outcome: Progressing   Problem: Cardiovascular: Goal: Ability to achieve and maintain adequate cardiovascular perfusion will improve Outcome: Progressing   Problem: Health Behavior/Discharge Planning: Goal: Ability to safely manage health-related needs after discharge will improve Outcome: Progressing

## 2023-03-20 LAB — CBC
HCT: 34.6 % — ABNORMAL LOW (ref 36.0–46.0)
Hemoglobin: 11.5 g/dL — ABNORMAL LOW (ref 12.0–15.0)
MCH: 26.9 pg (ref 26.0–34.0)
MCHC: 33.2 g/dL (ref 30.0–36.0)
MCV: 80.8 fL (ref 80.0–100.0)
Platelets: 91 10*3/uL — ABNORMAL LOW (ref 150–400)
RBC: 4.28 MIL/uL (ref 3.87–5.11)
WBC: 7.9 10*3/uL (ref 4.0–10.5)
nRBC: 0 % (ref 0.0–0.2)

## 2023-03-20 LAB — IRON AND TIBC
Iron: 66 ug/dL (ref 28–170)
Saturation Ratios: 18 % (ref 10.4–31.8)
TIBC: 378 ug/dL (ref 250–450)
UIBC: 312 ug/dL

## 2023-03-20 LAB — HEPARIN LEVEL (UNFRACTIONATED): Heparin Unfractionated: 0.61 [IU]/mL (ref 0.30–0.70)

## 2023-03-20 LAB — GLUCOSE, CAPILLARY: Glucose-Capillary: 108 mg/dL — ABNORMAL HIGH (ref 70–99)

## 2023-03-20 LAB — APTT: aPTT: >200 s (ref 24–36)

## 2023-03-20 LAB — FERRITIN: Ferritin: 63 ng/mL (ref 11–307)

## 2023-03-20 NOTE — Progress Notes (Signed)
Physical Therapy Treatment Patient Details Name: Jacqueline [name] MRN: [id number] DOB: 15-Oct-2000 Today's Date: 03/20/2023   History of Present Illness The pt is a 23 yo female presenting 12/20 with expressive aphasia. 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    PT Comments  Patient alert and verbalizing today! Observed her use nurse call button and when asked what she needed she replied, "I need.. I need" without the ability to finish her thought. Followed simple commands with Lt extremities. Required only CGA for sitting balance and progressed to standing with +2 mod assist (required blocking Rt knee and assist to wt-shift from right lean to midline). Noted incr tone (flexor) in Rt extremities. Continue to feel pt will benefit from post-acute inpatient rehab with >3 hours of therapy per day.     If plan is discharge home, recommend the following: Two people to help with walking and/or transfers;Two people to help with bathing/dressing/bathroom;Assistance with cooking/housework;Assistance with feeding;Direct supervision/assist for medications management;Direct supervision/assist for financial management;Assist for transportation;Help with stairs or ramp for entrance;Supervision due to cognitive status   Can travel by private vehicle        Equipment Recommendations  Other (comment)    Recommendations for Other Services       Precautions / Restrictions Precautions Precautions: Fall Precaution Comments: cortrak Restrictions Weight Bearing Restrictions Per Provider Order: No     Mobility  Bed Mobility Overal bed mobility: Needs Assistance Bed Mobility: Supine to Sit   Sidelying to sit: +2 for safety/equipment, Total assist, HOB elevated Supine to sit: Max assist     General bed mobility comments: pt initiating and using  LUE to pull her torso up/forward; assist to move RLE over EOB and scoot out to EOB    Transfers Overall transfer level: Needs assistance Equipment used: 2 person hand held assist Transfers: Sit to/from Stand, Bed to chair/wheelchair/BSC Sit to Stand: Max assist, +2 physical assistance (Progress to MOD A+2 from chair) Stand pivot transfers: Max assist, +2 physical assistance         General transfer comment: Initiation of transfers improved. Pt following commands for safety and progressed to MOD A +2 to stand from chair. Pt attending to RLE but minimal activation noted.    Ambulation/Gait                   Stairs             Wheelchair Mobility     Tilt Bed    Modified Rankin (Stroke Patients Only) Modified Rankin (Stroke Patients Only) Pre-Morbid Rankin Score: No symptoms Modified Rankin: Severe disability     Balance Overall balance assessment: Needs assistance Sitting-balance support: Feet supported, Single extremity supported Sitting balance-Leahy Scale: Fair     Standing balance support: Bilateral upper extremity supported Standing balance-Leahy Scale: Poor Standing balance comment: +2 for standing                            Cognition Arousal: Alert Behavior During Therapy: WFL for tasks assessed/performed Overall Cognitive Status: Difficult to assess Area of Impairment: Following commands, Safety/judgement, Problem solving, Awareness                       Following Commands: Follows one step commands with increased time Safety/Judgement: Decreased awareness of safety, Decreased awareness of  deficits Awareness: Intellectual Problem Solving: Slow processing, Decreased initiation, Requires verbal cues, Difficulty sequencing General Comments: Vast improvement in attention, initiation, cognition, arousal, and command following. Inconsistent with yes/no with pt repeatedly saying "yes" to most questions.        Exercises       General Comments General comments (skin integrity, edema, etc.): OT applying soft elbow brace to RUE due to incr flexor tone      Pertinent Vitals/Pain Pain Assessment Pain Assessment: Faces Faces Pain Scale: Hurts little more Pain Location: RUE with PROM Pain Descriptors / Indicators: Moaning, Grimacing, Crying Pain Intervention(s): Limited activity within patient's tolerance, Monitored during session, Repositioned    Home Living                          Prior Function            PT Goals (current goals can now be found in the care plan section) Acute Rehab PT Goals Time For Goal Achievement: 03/24/23 Potential to Achieve Goals: Good Progress towards PT goals: Progressing toward goals    Frequency    Min 1X/week      PT Plan      Co-evaluation PT/OT/SLP Co-Evaluation/Treatment: Yes Reason for Co-Treatment: Complexity of the patient's impairments (multi-system involvement);To address functional/ADL transfers;Necessary to address cognition/behavior during functional activity;For patient/therapist safety PT goals addressed during session: Balance;Mobility/safety with mobility OT goals addressed during session: Other (comment) (bed mobility and functional transfers as a precursor to OOB ADLs)      AM-PAC PT "6 Clicks" Mobility   Outcome Measure  Help needed turning from your back to your side while in a flat bed without using bedrails?: A Lot Help needed moving from lying on your back to sitting on the side of a flat bed without using bedrails?: A Lot Help needed moving to and from a bed to a chair (including a wheelchair)?: Total Help needed standing up from a chair using your arms (e.g., wheelchair or bedside chair)?: Total Help needed to walk in hospital room?: Total Help needed climbing 3-5 steps with a railing? : Total 6 Click Score: 8    End of Session Equipment Utilized During Treatment: Gait belt Activity Tolerance: Patient limited by  fatigue;Other (comment) (limited by cognitive and language deficits) Patient left: with call bell/phone within reach;in chair;with chair alarm set Nurse Communication: Mobility status;Need for lift equipment (should do well with stedy) PT Visit Diagnosis: Other abnormalities of gait and mobility (R26.89);Hemiplegia and hemiparesis;Other symptoms and signs involving the nervous system (R29.898) Hemiplegia - Right/Left: Right Hemiplegia - dominant/non-dominant: Dominant Hemiplegia - caused by: Cerebral infarction     Time: 4854-6270 PT Time Calculation (min) (ACUTE ONLY): 29 min  Charges:    $Therapeutic Activity: 8-22 mins PT General Charges $$ ACUTE PT VISIT: 1 Visit                      Jerolyn Center, PT Acute Rehabilitation Services  Office [phone]    Zena Amos 03/20/2023, 3:49 PM

## 2023-03-20 NOTE — Progress Notes (Addendum)
Latest Reference Range & Units 03/20/23 06:52  APTT 24 - 36 seconds >200 (HH)  [facility]): Data is critically high  Ms Tia Masker [facility] made aware, Lab will be redrawn.

## 2023-03-20 NOTE — Progress Notes (Signed)
Speech Language Pathology Treatment: Dysphagia;Cognitive-Linquistic  Patient Details Name: Jacqueline [name] MRN: [id number] DOB: [birth year] Today's Date: 03/20/2023 Time: 7322-0254 SLP Time Calculation (min) (ACUTE ONLY): 21 min  Assessment / Plan / Recommendation Clinical Impression  SLP awakened pt from sleep and she became fully alert. She used the call bell independently multiple times in session, is articulating "No", "my god" and "what". SLP provided max visual and verbal models for pt to repeat words to express wants and needs such as more, yes and pain. Pt at times tried to participate, but repetition was only sustained phonation. SLP used Lingraphica device to elicit selections from pt. Pt touched buttons and attended to device, but could not accurately express yes or no to biographical questions with max visual and verbal reinforcement. She seemed to look for a button to express her needs, but didn't seem to find meaning from any. SLP assumed pt was trying to call RN for pain given her use of call bell and trying to get off her bottom, but pt could not confirm this verbally or by pointing to buttons with max cues.  RN arrived to report pt was not due for pain meds. Pt was able to sip thin liquids consecutively from a straw today without immediate or delayed coughing. Will upgrade to thin liquids and also regular solids to see if pt accepts any food if greater variety is offered. RN reports there are still meals she has not eaten due to lethargy.   HPI HPI: The pt is a 23 yo female presenting 12/20 with expressive aphasia. 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 MRI showed fairly extensive acute left MCA territory and watershed territory infarcts within the left cerebral hemisphere as well as small acute R frontoparietal cortical/subcortical infarcts. Pt initially passed the Yale swallow screen but become more aphasic and started to cough with PO  intake. 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      SLP Plan  Continue with current plan of care      Recommendations for follow up therapy are one component of a multi-disciplinary discharge planning process, led by the attending physician.  Recommendations may be updated based on patient status, additional functional criteria and insurance authorization.    Recommendations  Diet recommendations: Regular;Thin liquid Liquids provided via: Cup;Straw Medication Administration: Crushed with puree Supervision: Full supervision/cueing for compensatory strategies                      Frequent or constant Supervision/Assistance Dysphagia, oropharyngeal phase (R13.12)     Continue with current plan of care    Harlon Ditty, MA CCC-SLP  Acute Rehabilitation Services Secure Chat Preferred Office [phone]  Claudine Mouton  03/20/2023, 2:14 PM

## 2023-03-20 NOTE — Progress Notes (Signed)
Progress Note   Patient: Jacqueline [name] KGM:[id number] DOB: May 23, 2000 DOA: 02/21/2023     27 DOS: the patient was seen and examined on 03/20/2023 at 11:48AM      Brief hospital course: 23 y.o. F with hx thrombocytopenia, post-partum hemorrhage, PE and positive lupus AC lost to Hematology follow up who presented with stroke.   Night before admission, drank a significant amount of alcohol and took some Suboxone that was not prescribed to her.     In the morning, had expressive aphasia and was brought to ER.  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  She went to IR for mechanical thrombectomy, but this was unfortunately unsuccessful.   Patient was agitated for the first 4-5 days in the ICU, required Precedex.  12/26 MRI brain showed extension of infarct, now very large.              Assessment and Plan: Acute ischemic stroke, likely related to hypercoagulability from lupus anticoagulant Presented with CTA showing occluded left proximal M2 MCA, and MRI with large left MCA territory infarct.  Underwent thrombectomy which seemed initially successful, but then had worsening stenosis and subsequent complete closure of left M2  Repeat MRI showed extension of stroke on hospital day 5.  EEG with severe encephalopathy, no seizure. LDL 42, A1c normal Known lupus anticoagulant, last hematology follow-up, no longer on anticoagulation. Has dense aphasia which impacts ability to participate in rehab - Transition to heparin infusion given plans for PEG tube 1/17.  - Continue attempts at physical therapy     Cognitive impairment As a result of her extensive stroke, the patient has had minimal oral intake.  Seems to swallow chopped diet well without aspiration, but cognition limits oral intake.  Calorie count Jan 10-13 showed <10% caloric needs. - IR consult for PEG - Continue tube feeds and free water -  dietitian following    Aphasia Likely expressive and receptive, however patient  able to follow commands this AM.   Her crying and inability to show sustained cooperation in care is likely a reflection of aphasia.  She was on Precedex and Depakene earlier in this hospital stay, but these were stopped due to agitation. - Continue clonazepam to nightly use - Continue Topamax as recommended by PM&R 12/31 - Continue gabapentin (as recommended by PM&R) - Avoid haldol or benzodiazepines for sedation, as these impair the patient from calling out if she is soiled or in pain   Lupus anticoagulant History of pulmonary embolism - Heparin infusion for now, will resume DOAC once PEG placed  -Follow-up with rheumatology in outpatient setting    UTI Resolved   Anemia Thrombocytopenia Chronic thrombocytopenia.  Seen by Hematology during index hospitalization 2/24 (thrombocytopenia attributed to "nutritional deficiencies") and then she was lost to follow up.  Seen again by hematology this admission, no explanation for thrombocytopenia proposed.  Platelets stable at ~70K - Continue B12, folate, MVI, iron - Outpatient hematology and rheumatology follow up        Subjective: Patient non verbal, but able to follow commands. Moves L sided extremities voluntarily.    Physical Exam: BP 101/64 (BP Location: Left Arm)   Pulse 95   Temp 98.9 F (37.2 C) (Oral)   Resp 18   Ht 5\' 4"  (1.626 m)   Wt 76.5 kg   SpO2 100%   BMI 28.95 kg/m    Physical Exam  Constitutional: In no distress. Cardiovascular: Normal rate, regular rhythm. No lower extremity edema  Pulmonary: Non labored  breathing on room air, no wheezing or rales.   Abdominal: Soft. Normal bowel sounds. Non distended and non tender Musculoskeletal: RUE/RLE contracted. 4/5 of LUE/LLE     Neurological: Awake, but no meaningful speech. Follows simple commands Skin: Skin is warm and dry.       Data Reviewed: No other labs.    Disposition: Status is: Inpatient Admitted with massive stroke  Now with severe  physical and verbal deficits.  Will need PEG placement.  After PEG placement,  rehab       Author: Marolyn Haller, MD 03/20/2023 4:44 PM  For on call review [email or web address].

## 2023-03-20 NOTE — Progress Notes (Addendum)
Inpatient Rehabilitation Admissions Coordinator   I await updated therapy assessments as patient is awake and responsive. I will then begin insurance Auth for admit to CIR . I will contact her Mom today to further discuss caregiver supports and plans after discharge after CIR. I have discussed with PT And OT today.  Ottie Glazier, RN, MSN Rehab Admissions Coordinator [phone] 03/20/2023 1:04 PM   I spoke with her Mom by phone and she will further discuss with patient's boyfriend as well as other family members how they will provide 24/7 caregiver supports , but it is confirmed of her support is available.  Ottie Glazier, RN, MSN Rehab Admissions Coordinator [phone] 03/20/2023 1:20 PM

## 2023-03-20 NOTE — Progress Notes (Addendum)
PHARMACY - ANTICOAGULATION CONSULT NOTE  Pharmacy Consult: Eliquis >> IV Heparin Indication: Lupus AC and endoluminal thrombus   No Known Allergies  Patient Measurements: Height: 5\' 4"  (162.6 cm) Weight: 76.5 kg (168 lb 10.4 oz) IBW/kg (Calculated) : 54.7 Heparin Dosing Weight: 72 kg  Vital Signs: Temp: 98.7 F (37.1 C) (01/16 0510) Temp Source: Oral (01/16 0510) BP: 98/59 (01/16 0510) Pulse Rate: 95 (01/15 2314)  Labs: Recent Labs    03/18/23 0518 03/19/23 0559 03/19/23 1651 03/20/23 0652  HGB 12.1 11.9*  --   --   HCT 36.3 35.0*  --   --   PLT 121* 106*  --   --   APTT  --   --  82*  --   HEPARINUNFRC  --   --  0.63 0.61  CREATININE 0.80 0.76  --   --     Estimated Creatinine Clearance: 108.5 mL/min (by C-G formula based on SCr of 0.76 mg/dL).   Medical History: Past Medical History:  Diagnosis Date   Bacterial vaginitis 07/10/2016   Candida vaginitis 07/24/2016   Headache in pregnancy, antepartum, third trimester 07/24/2016   Kell isoimmunization during pregnancy     Assessment: 22 YOF presented with acute CVA and MRI also shows endoluminal thrombus.  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 Hx of thrombocytopenia. Patient was transitioned from IV heparin to Eliquis earlier this admission. Pharmacy consulted to transition Eliquis back to IV heparin in anticipation of PEG tube placement.  Last dose of Eliquis 1/14 @ 2153. Eliquis will be affecting heparin level so will utilize aPTT for monitoring until levels correlate.  Heparin level therapeutic at 0.61, aPTT >200 which is likely inaccurate. Heparin levels and aPTT were correlating last night so will disregard aPTT level from this AM and dose off heparin level. No issues w/ bleeding per d/w RN.  Goal of Therapy:  Heparin level 0.3-0.7 units/ml aPTT 66-102 seconds Monitor platelets by anticoagulation protocol: Yes   Plan:  Continue heparin gtt at  1150 units/hr Daily heparin level F/U PEG tube placement (tentatively 1/17) and ability to resume Eliquis   Rexford Maus, PharmD, BCPS 03/20/2023 8:13 AM

## 2023-03-20 NOTE — Progress Notes (Signed)
Occupational Therapy Treatment Patient Details Name: Jacqueline [name] MRN: [id number] DOB: 23-Dec-2000 Today's Date: 03/20/2023   History of present illness The pt is a 23 yo female presenting 12/20 with expressive aphasia. 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   OT comments  Pt is making excellent progress towards her acute goals. Session completed with PT to address safe OOB transfers. Pt observed with significant improvement in attention, arousal, following commands, expressing needs, initiating functional tasks, and functional use of L hemibody. Overall, pt needed MAX A to transition to EOB, MOD-MAX A+2 to stand and pivot from bed to chair. Pt expressed want to change gowns, able to complete task with MAX A. Pt attempted to facilitate functional communication with handwriting task using non-dominant hand. Pt successfully drew two smiley faces. Pt RUE remains impaired with noteable increase in tone in elbow. Soft elbow splint applied to facilitate extension at the end of the session. Pt communicating functionally with yes/no responses throughout the session. Pt continues to benefit from acute OT to facilitate discharge to AIR.       If plan is discharge home, recommend the following:  Two people to help with walking and/or transfers;Two people to help with bathing/dressing/bathroom;Assistance with cooking/housework;Direct supervision/assist for medications management;Assistance with feeding;Direct supervision/assist for financial management;Help with stairs or ramp for entrance;Assist for transportation;Supervision due to cognitive status   Equipment Recommendations  Other (comment) (defer)    Recommendations for Other Services Rehab consult    Precautions / Restrictions Precautions Precautions: Fall Precaution Comments:  cortrak Restrictions Weight Bearing Restrictions Per Provider Order: No       Mobility Bed Mobility Overal bed mobility: Needs Assistance Bed Mobility: Supine to Sit     Supine to sit: Max assist     General bed mobility comments: Pt initiating bed mobility and verbal commands for safety.    Transfers Overall transfer level: Needs assistance Equipment used: 2 person hand held assist Transfers: Sit to/from Stand, Bed to chair/wheelchair/BSC Sit to Stand: Max assist, +2 physical assistance (Progress to MOD A+2 from chair) Stand pivot transfers: Max assist, +2 physical assistance         General transfer comment: Initiation of transfers improved. Pt following commands for safety and progressed to MOD A +2 to stand from chair. Pt attending to RLE but minimal activation noted.     Balance Overall balance assessment: Needs assistance Sitting-balance support: Feet supported, Single extremity supported Sitting balance-Leahy Scale: Fair     Standing balance support: Bilateral upper extremity supported Standing balance-Leahy Scale: Poor                             ADL either performed or assessed with clinical judgement   ADL Overall ADL's : Needs assistance/impaired                 Upper Body Dressing : Maximal assistance;Sitting Upper Body Dressing Details (indicate cue type and reason): to don fresh gown. Required TOTAL A to thread RUE and verbal cues to initiate threading LUE. Required assistance tying gown.     Toilet Transfer: Maximal assistance;+2 for physical assistance;Stand-pivot Toilet Transfer Details (indicate cue type and reason): simulated from bed to chair with hand held assist +2         Functional mobility during ADLs: Maximal assistance;+2 for physical assistance General ADL  Comments: Signifcant improve for all aspects of ADLs, cognition, and communication. Pt express needs to change her gown and agreeable to transfers OOB.     Extremity/Trunk Assessment Upper Extremity Assessment Upper Extremity Assessment: RUE deficits/detail RUE Deficits / Details: no AROM noted, RUE resting in protective internal rotation with flexion. wrist and digits have full PROM. Tone in R elbow and unable to passively stretch into full extension. Placed soft elbow splint at the end of session. Pt denied discomfort. RUE Sensation: decreased light touch;decreased proprioception RUE Coordination: decreased fine motor;decreased gross motor LUE Deficits / Details: Using functionally and to command this date. Attempted hand writing task with non-dominant.   Lower Extremity Assessment Lower Extremity Assessment: Defer to PT evaluation        Vision   Vision Assessment?: Vision impaired- to be further tested in functional context   Perception Perception Perception: Not tested   Praxis Praxis Praxis: Not tested    Cognition Arousal: Alert Behavior During Therapy: [facility] for tasks assessed/performed Overall Cognitive Status: Impaired/Different from baseline Area of Impairment: Following commands, Safety/judgement, Problem solving, Awareness, Attention                   Current Attention Level: Sustained   Following Commands: Follows one step commands with increased time Safety/Judgement: Decreased awareness of safety, Decreased awareness of deficits Awareness: Intellectual Problem Solving: Slow processing, Decreased initiation, Requires verbal cues, Difficulty sequencing General Comments: Vast improvement in attention, initiation, cognition, arousal, and command following. Communicating appropriately with yes/no throughout and attempted to say short sentences. Noteable frustration with communication barriers at the end of session.        Exercises      Shoulder Instructions       General Comments OT applying soft elbow brace to RUE due to incr flexor tone    Pertinent Vitals/ Pain       Pain Assessment Pain  Assessment: Faces Faces Pain Scale: Hurts little more Pain Location: RUE with PROM Pain Descriptors / Indicators: Moaning, Grimacing, Crying Pain Intervention(s): Monitored during session  Home Living                                          Prior Functioning/Environment              Frequency  Min 1X/week        Progress Toward Goals  OT Goals(current goals can now be found in the care plan section)  Progress towards OT goals: Progressing toward goals  Acute Rehab OT Goals Patient Stated Goal: to sit up OT Goal Formulation: With patient Time For Goal Achievement: 04/02/23 Potential to Achieve Goals: Good ADL Goals Pt Will Perform Grooming: with mod assist;sitting Pt Will Perform Upper Body Bathing: with mod assist;sitting Pt/caregiver will Perform Home Exercise Program: Right Upper extremity;With written HEP provided;Increased ROM Additional ADL Goal #1: Pt will perform bed mobility with MIN A in preparation for ADLs Additional ADL Goal #2: Pt will sequence through simple ADL tasks with MIN verbal cues  Plan      Co-evaluation    PT/OT/SLP Co-Evaluation/Treatment: Yes Reason for Co-Treatment: Complexity of the patient's impairments (multi-system involvement);To address functional/ADL transfers;Necessary to address cognition/behavior during functional activity;For patient/therapist safety PT goals addressed during session: Balance;Mobility/safety with mobility OT goals addressed during session: Other (comment) (bed mobility and functional transfers as a precursor to OOB ADLs)  AM-PAC OT "6 Clicks" Daily Activity     Outcome Measure   Help from another person eating meals?: Total Help from another person taking care of personal grooming?: A Lot Help from another person toileting, which includes using toliet, bedpan, or urinal?: A Lot Help from another person bathing (including washing, rinsing, drying)?: A Lot Help from another person to  put on and taking off regular upper body clothing?: A Lot Help from another person to put on and taking off regular lower body clothing?: Total 6 Click Score: 10    End of Session Equipment Utilized During Treatment: Gait belt  OT Visit Diagnosis: Unsteadiness on feet (R26.81);Other abnormalities of gait and mobility (R26.89);Muscle weakness (generalized) (M62.81);Hemiplegia and hemiparesis;Cognitive communication deficit (R41.841) Hemiplegia - Right/Left: Right Hemiplegia - dominant/non-dominant: Dominant Hemiplegia - caused by: Cerebral infarction Pain - Right/Left: Right Pain - part of body: Arm;Hand   Activity Tolerance Patient tolerated treatment well   Patient Left in chair;with chair alarm set;with call bell/phone within reach (Posey belt)   Nurse Communication Mobility status;Need for lift equipment        Time: 0454-0981 OT Time Calculation (min): 34 min  Charges: OT General Charges $OT Visit: 1 Visit OT Treatments $Therapeutic Activity: 8-22 mins  [address] Darliss Cheney 03/20/2023, 3:50 PM

## 2023-03-21 ENCOUNTER — Inpatient Hospital Stay (HOSPITAL_COMMUNITY): Payer: Medicaid Other

## 2023-03-21 HISTORY — PX: IR GASTROSTOMY TUBE MOD SED: IMG625

## 2023-03-21 LAB — GLUCOSE, CAPILLARY
Glucose-Capillary: 93 mg/dL (ref 70–99)
Glucose-Capillary: 91 mg/dL (ref 70–99)
Glucose-Capillary: 85 mg/dL (ref 70–99)
Glucose-Capillary: 92 mg/dL (ref 70–99)

## 2023-03-21 LAB — CBC
HCT: 35.8 % — ABNORMAL LOW (ref 36.0–46.0)
Hemoglobin: 12 g/dL (ref 12.0–15.0)
MCH: 26.7 pg (ref 26.0–34.0)
MCHC: 33.5 g/dL (ref 30.0–36.0)
MCV: 79.7 fL — ABNORMAL LOW (ref 80.0–100.0)
Platelets: 76 10*3/uL — ABNORMAL LOW (ref 150–400)
RBC: 4.49 MIL/uL (ref 3.87–5.11)
RDW: 24.1 % — ABNORMAL HIGH (ref 11.5–15.5)
WBC: 9.1 10*3/uL (ref 4.0–10.5)
nRBC: 0 % (ref 0.0–0.2)

## 2023-03-21 LAB — PROTIME-INR
INR: 1.1 (ref 0.8–1.2)
Prothrombin Time: 14 s (ref 11.4–15.2)

## 2023-03-21 LAB — HEPARIN LEVEL (UNFRACTIONATED): Heparin Unfractionated: 0.72 [IU]/mL — ABNORMAL HIGH (ref 0.30–0.70)

## 2023-03-21 MED ORDER — MIDAZOLAM HCL 2 MG/2ML IJ SOLN
INTRAMUSCULAR | Status: AC | PRN
  Administered 2023-03-21 (×2): .5 mg via INTRAVENOUS

## 2023-03-21 MED ORDER — IOHEXOL 300 MG/ML  SOLN
50.0000 mL | Freq: Once | INTRAMUSCULAR | Status: AC | PRN
  Administered 2023-03-21: 15 mL

## 2023-03-21 MED ORDER — MIDAZOLAM HCL 2 MG/2ML IJ SOLN
INTRAMUSCULAR | Status: AC
Start: 2023-03-21 — End: ?
  Filled 2023-03-21: qty 2

## 2023-03-21 MED ORDER — FENTANYL CITRATE (PF) 100 MCG/2ML IJ SOLN
INTRAMUSCULAR | Status: AC | PRN
  Administered 2023-03-21: 50 ug via INTRAVENOUS

## 2023-03-21 MED ORDER — CEFAZOLIN SODIUM-DEXTROSE 2-4 GM/100ML-% IV SOLN
INTRAVENOUS | Status: AC | PRN
  Administered 2023-03-21: 2 g via INTRAVENOUS

## 2023-03-21 MED ORDER — APIXABAN 5 MG PO TABS
5.0000 mg | ORAL_TABLET | Freq: Two times a day (BID) | ORAL | Status: DC
Start: 2023-03-21 — End: 2023-03-25
  Administered 2023-03-21 – 2023-03-25 (×8): 5 mg via ORAL
  Filled 2023-03-21 (×8): qty 1

## 2023-03-21 MED ORDER — FENTANYL CITRATE (PF) 100 MCG/2ML IJ SOLN
INTRAMUSCULAR | Status: AC
  Filled 2023-03-21: qty 2

## 2023-03-21 MED ORDER — LIDOCAINE HCL 1 % IJ SOLN
INTRAMUSCULAR | Status: AC
  Filled 2023-03-21: qty 20

## 2023-03-21 MED ORDER — TRIPLE ANTIBIOTIC 3.5-400-5000 EX OINT
1.0000 | TOPICAL_OINTMENT | Freq: Every day | CUTANEOUS | Status: DC
  Administered 2023-03-22 – 2023-03-25 (×3): 1 via TOPICAL
  Filled 2023-03-21 (×5): qty 1

## 2023-03-21 MED ORDER — HEPARIN (PORCINE) 25000 UT/250ML-% IV SOLN: 1050.0000 [IU]/h | INTRAVENOUS | Status: DC

## 2023-03-21 MED ORDER — CEFAZOLIN SODIUM-DEXTROSE 2-4 GM/100ML-% IV SOLN
INTRAVENOUS | Status: AC
  Filled 2023-03-21: qty 100

## 2023-03-21 NOTE — Plan of Care (Signed)
  Problem: Education: Goal: Knowledge of disease or condition will improve Outcome: Progressing Goal: Knowledge of secondary prevention will improve (MUST DOCUMENT ALL) Outcome: Progressing Goal: Knowledge of patient specific risk factors will improve [name] N/A or DELETE if not current risk factor) Outcome: Progressing   Problem: Ischemic Stroke/TIA Tissue Perfusion: Goal: Complications of ischemic stroke/TIA will be minimized Outcome: Progressing   Problem: Coping: Goal: Will verbalize positive feelings about self Outcome: Progressing Goal: Will identify appropriate support needs Outcome: Progressing   Problem: Health Behavior/Discharge Planning: Goal: Ability to manage health-related needs will improve Outcome: Progressing Goal: Goals will be collaboratively established with patient/family Outcome: Progressing   Problem: Self-Care: Goal: Ability to participate in self-care as condition permits will improve Outcome: Progressing Goal: Verbalization of feelings and concerns over difficulty with self-care will improve Outcome: Progressing Goal: Ability to communicate needs accurately will improve Outcome: Progressing   Problem: Nutrition: Goal: Risk of aspiration will decrease Outcome: Progressing Goal: Dietary intake will improve Outcome: Progressing   Problem: Education: Goal: Knowledge of General Education information will improve Description: Including pain rating scale, medication(s)/side effects and non-pharmacologic comfort measures Outcome: Progressing   Problem: Health Behavior/Discharge Planning: Goal: Ability to manage health-related needs will improve Outcome: Progressing   Problem: Clinical Measurements: Goal: Ability to maintain clinical measurements within normal limits will improve Outcome: Progressing Goal: Will remain free from infection Outcome: Progressing Goal: Diagnostic test results will improve Outcome: Progressing Goal: Cardiovascular  complication will be avoided Outcome: Progressing   Problem: Activity: Goal: Risk for activity intolerance will decrease Outcome: Progressing   Problem: Nutrition: Goal: Adequate nutrition will be maintained Outcome: Progressing   Problem: Coping: Goal: Level of anxiety will decrease Outcome: Progressing   Problem: Elimination: Goal: Will not experience complications related to bowel motility Outcome: Progressing Goal: Will not experience complications related to urinary retention Outcome: Progressing   Problem: Pain Management: Goal: General experience of comfort will improve Outcome: Progressing   Problem: Safety: Goal: Ability to remain free from injury will improve Outcome: Progressing   Problem: Skin Integrity: Goal: Risk for impaired skin integrity will decrease Outcome: Progressing   Problem: Education: Goal: Understanding of CV disease, CV risk reduction, and recovery process will improve Outcome: Progressing Goal: Individualized Educational Video(s) Outcome: Progressing   Problem: Activity: Goal: Ability to return to baseline activity level will improve Outcome: Progressing   Problem: Cardiovascular: Goal: Ability to achieve and maintain adequate cardiovascular perfusion will improve Outcome: Progressing   Problem: Health Behavior/Discharge Planning: Goal: Ability to safely manage health-related needs after discharge will improve Outcome: Progressing

## 2023-03-21 NOTE — Procedures (Signed)
Interventional Radiology Procedure Note  Procedure: Placement of percutaneous 31F pull-through gastrostomy tube. Complications: None Recommendations: - OK to use in 4 hours - ok to restart hep ggt in 4 hours - ice/otc's for abd pain - routine wound care   Signed,  Yvone Neu. Loreta Ave, DO, ABVM, RPVI

## 2023-03-21 NOTE — Progress Notes (Signed)
Inpatient Rehabilitation Admissions Coordinator   I will begin insurance Auth with [facility] Medicaid [facility] plan today for possible admit to Cir next week pending approval.  Ottie Glazier, RN, MSN Rehab Admissions Coordinator [phone] 03/21/2023 8:19 AM

## 2023-03-21 NOTE — Progress Notes (Signed)
  Inpatient Rehabilitation Admissions Coordinator   I have insurance approval for CIR. I await bed availability to admit next week.  Ottie Glazier, RN, MSN Rehab Admissions Coordinator [phone] 03/21/2023 12:00 PM

## 2023-03-21 NOTE — TOC Progression Note (Signed)
Transition of Care [facility]) - Progression Note    Patient Details  Name: Jacqueline [name] MRN: [id number] Date of Birth: 09-Jan-2001  Transition of Care [facility]) CM/SW Contact  Kermit Balo, RN Phone Number: 03/21/2023, 10:46 AM  Clinical Narrative:     Pt is for PEG placement today. CIR is starting insurance auth for admission. TOC following.  Expected Discharge Plan: IP Rehab Facility    Expected Discharge Plan and Services                                               Social Determinants of Health (SDOH) Interventions SDOH Screenings   Food Insecurity: No Food Insecurity (03/07/2023)  Housing: Patient Unable To Answer (02/21/2023)  Transportation Needs: Patient Unable To Answer (02/21/2023)  Utilities: Patient Unable To Answer (02/21/2023)  Financial Resource Strain: Not on File (06/21/2021)   Received from [place], [place]  Physical Activity: Not on File (06/21/2021)   Received from [place], [place]  Social Connections: Not on File (11/16/2022)   Received from [facility]  Stress: Not on File (06/21/2021)   Received from [place], [place]  Tobacco Use: High Risk (02/21/2023)    Readmission Risk Interventions     No data to display

## 2023-03-21 NOTE — Progress Notes (Signed)
PHARMACY - ANTICOAGULATION CONSULT NOTE  Pharmacy Consult: Eliquis >> IV Heparin Indication: Lupus AC and endoluminal thrombus   No Known Allergies  Patient Measurements: Height: 5\' 4"  (162.6 cm) Weight: 76.5 kg (168 lb 10.4 oz) IBW/kg (Calculated) : 54.7 Heparin Dosing Weight: 72 kg  Vital Signs: Temp: 99.3 F (37.4 C) (01/17 1644) Temp Source: Oral (01/17 1644) BP: 117/81 (01/17 1644) Pulse Rate: 86 (01/17 1644)  Labs: Recent Labs    03/19/23 0559 03/19/23 1651 03/20/23 0652 03/20/23 1047 03/21/23 0808  HGB 11.9*  --   --  11.5* 12.0  HCT 35.0*  --   --  34.6* 35.8*  PLT 106*  --   --  91* 76*  APTT  --  82* >200*  --   --   LABPROT  --   --   --   --  14.0  INR  --   --   --   --  1.1  HEPARINUNFRC  --  0.63 0.61  --  0.72*  CREATININE 0.76  --   --   --   --     Estimated Creatinine Clearance: 108.5 mL/min (by C-G formula based on SCr of 0.76 mg/dL).   Medical History: Past Medical History:  Diagnosis Date   Bacterial vaginitis 07/10/2016   Candida vaginitis 07/24/2016   Headache in pregnancy, antepartum, third trimester 07/24/2016   Kell isoimmunization during pregnancy     Assessment: 22 YOF presented with acute CVA and MRI also shows endoluminal thrombus.  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 Hx of thrombocytopenia. Patient was transitioned from IV heparin to Eliquis earlier this admission. Pharmacy consulted to transition Eliquis back to IV heparin in anticipation of PEG tube placement.  Last dose of Eliquis 1/14 @ 2153. Eliquis will be affecting heparin level so will utilize aPTT for monitoring until levels correlate.  Heparin level slightly elevated this AM at 0.72.  Pt s/p PEG tube placement in IR ~1600. IR MD okay to restart heparin 4 hours post procedure so ~2000.  Goal of Therapy:  Heparin level 0.3-0.7 units/ml aPTT 66-102 seconds Monitor platelets by anticoagulation protocol:  Yes   Plan:  At 2000, restart heparin gtt at 1050 units/hr Will f/u heparin level in a.m.  Christoper Fabian, PharmD, BCPS Please see amion for complete clinical pharmacist phone list 03/21/2023 6:27 PM

## 2023-03-21 NOTE — Progress Notes (Signed)
Physical Therapy Treatment Patient Details Name: Jacqueline [name] MRN: [id number] DOB: [birth year] Today's Date: 03/21/2023   History of Present Illness The pt is a 23 yo female presenting 12/20 with expressive aphasia. 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    PT Comments  Pt with continued progress towards acute goals, session focused on progression of functional transfers with self correction of midline posture with visual feedback from mirror. With cues to attend to mirror pt able to self correct posture in sitting and maintain midline with CGA for safety. Pt requiring mod A to power up to standing with HHA and RW increasing to max A after 2 stands due to fatigue. With max cues and attention to mirror pt able to self correct R lateral lean in standing. Pt very eager to progress gait and able to take x1 step with LLE away from EOB, knee buckling on R and pt unable to advance RLE. Pt able to make wants known throughout session, strongly stating NO when asked if she wanted to sit up in chair. Current plan remains appropriate to address deficits and maximize functional independence and decrease caregiver burden. Pt continues to benefit from skilled PT services to progress toward functional mobility goals.     If plan is discharge home, recommend the following: Two people to help with walking and/or transfers;Two people to help with bathing/dressing/bathroom;Assistance with cooking/housework;Assistance with feeding;Direct supervision/assist for medications management;Direct supervision/assist for financial management;Assist for transportation;Help with stairs or ramp for entrance;Supervision due to cognitive status   Can travel by private vehicle        Equipment Recommendations  Other (comment)    Recommendations for Other Services        Precautions / Restrictions Precautions Precautions: Fall Precaution Comments: cortrak Restrictions Weight Bearing Restrictions Per Provider Order: No     Mobility  Bed Mobility Overal bed mobility: Needs Assistance Bed Mobility: Supine to Sit, Sit to Supine     Supine to sit: Max assist Sit to supine: Mod assist   General bed mobility comments: pt initiating and using LUE to pull her torso up/forward; assist to move RLE over EOB and scoot out to EOB, mod A to return RLE to bed    Transfers Overall transfer level: Needs assistance Equipment used: 1 person hand held assist, Rolling walker (2 wheels) Transfers: Sit to/from Stand Sit to Stand: Max assist, Mod assist           General transfer comment: Pt following commands for safety and progressed to MOD A to stand from EOB with HHA, trialing RW with pt needing max A to shift weight to L on rise with fatigue. Pt attending to RLE but minimal activation noted.    Ambulation/Gait               General Gait Details: NT   Stairs             Wheelchair Mobility     Tilt Bed    Modified Rankin (Stroke Patients Only) Modified Rankin (Stroke Patients Only) Pre-Morbid Rankin Score: No symptoms Modified Rankin: Severe disability     Balance Overall balance assessment: Needs assistance Sitting-balance support: Feet supported, Single extremity supported Sitting balance-Leahy Scale: Fair Sitting balance - Comments: pt able to correct R lateral lean with visual feed back from mirror with cues to attend Postural control: Right lateral lean, Posterior lean  Standing balance support: Bilateral upper extremity supported Standing balance-Leahy Scale: Poor Standing balance comment: mod-total to maintain standing and shift weight to L, good self correction twith visual feedback from mirror                            Cognition Arousal: Alert Behavior During Therapy: WFL for tasks  assessed/performed Overall Cognitive Status: Difficult to assess Area of Impairment: Following commands, Safety/judgement, Problem solving, Awareness                   Current Attention Level: Sustained   Following Commands: Follows one step commands with increased time Safety/Judgement: Decreased awareness of safety, Decreased awareness of deficits Awareness: Intellectual Problem Solving: Slow processing, Decreased initiation, Requires verbal cues, Difficulty sequencing General Comments: Vast improvement in attention, initiation, cognition, arousal, and command following. Inconsistent with yes/no with pt repeatedly saying "no" to most questions. visible frustration with not being able to verbally state needs        Exercises Other Exercises Other Exercises: attempting marching with pt unable to support weight through R to lift LLE,    General Comments General comments (skin integrity, edema, etc.): soft brace donned on RUE throughout session      Pertinent Vitals/Pain Pain Assessment Pain Assessment: Faces Faces Pain Scale: No hurt Pain Intervention(s): Monitored during session    Home Living                          Prior Function            PT Goals (current goals can now be found in the care plan section) Acute Rehab PT Goals Patient Stated Goal: return to independence, to see her kids PT Goal Formulation: Patient unable to participate in goal setting Time For Goal Achievement: 03/24/23 Progress towards PT goals: Progressing toward goals    Frequency    Min 1X/week      PT Plan      Co-evaluation              AM-PAC PT "6 Clicks" Mobility   Outcome Measure  Help needed turning from your back to your side while in a flat bed without using bedrails?: A Lot Help needed moving from lying on your back to sitting on the side of a flat bed without using bedrails?: A Lot Help needed moving to and from a bed to a chair (including a  wheelchair)?: Total Help needed standing up from a chair using your arms (e.g., wheelchair or bedside chair)?: Total Help needed to walk in hospital room?: Total Help needed climbing 3-5 steps with a railing? : Total 6 Click Score: 8    End of Session Equipment Utilized During Treatment: Gait belt Activity Tolerance: Patient limited by fatigue;Other (comment) (limited by cognitive and language deficits) Patient left: with call bell/phone within reach;in chair;with chair alarm set Nurse Communication: Mobility status PT Visit Diagnosis: Other abnormalities of gait and mobility (R26.89);Hemiplegia and hemiparesis;Other symptoms and signs involving the nervous system (R29.898) Hemiplegia - Right/Left: Right Hemiplegia - dominant/non-dominant: Dominant Hemiplegia - caused by: Cerebral infarction     Time: 0454-0981 PT Time Calculation (min) (ACUTE ONLY): 35 min  Charges:    $Therapeutic Activity: 23-37 mins PT General Charges $$ ACUTE PT VISIT: 1 Visit                     Sumiya Mamaril R. PTA Acute Rehabilitation  Services Office: [phone]   Catalina Antigua 03/21/2023, 12:40 PM

## 2023-03-21 NOTE — Progress Notes (Signed)
PHARMACY - ANTICOAGULATION CONSULT NOTE  Pharmacy Consult: Eliquis >> IV Heparin Indication: Lupus AC and endoluminal thrombus   No Known Allergies  Patient Measurements: Height: 5\' 4"  (162.6 cm) Weight: 76.5 kg (168 lb 10.4 oz) IBW/kg (Calculated) : 54.7 Heparin Dosing Weight: 72 kg  Vital Signs: Temp: 98 F (36.7 C) (01/17 0812) Temp Source: Oral (01/17 0356) BP: 93/59 (01/17 0812) Pulse Rate: 102 (01/17 0812)  Labs: Recent Labs    03/19/23 0559 03/19/23 1651 03/20/23 0652 03/20/23 1047 03/21/23 0808  HGB 11.9*  --   --  11.5* 12.0  HCT 35.0*  --   --  34.6* 35.8*  PLT 106*  --   --  91* 76*  APTT  --  82* >200*  --   --   HEPARINUNFRC  --  0.63 0.61  --  0.72*  CREATININE 0.76  --   --   --   --     Estimated Creatinine Clearance: 108.5 mL/min (by C-G formula based on SCr of 0.76 mg/dL).   Medical History: Past Medical History:  Diagnosis Date   Bacterial vaginitis 07/10/2016   Candida vaginitis 07/24/2016   Headache in pregnancy, antepartum, third trimester 07/24/2016   Kell isoimmunization during pregnancy     Assessment: 22 YOF presented with acute CVA and MRI also shows endoluminal thrombus.  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 Hx of thrombocytopenia. Patient was transitioned from IV heparin to Eliquis earlier this admission. Pharmacy consulted to transition Eliquis back to IV heparin in anticipation of PEG tube placement.  Last dose of Eliquis 1/14 @ 2153. Eliquis will be affecting heparin level so will utilize aPTT for monitoring until levels correlate.  Heparin level slightly elevated this AM at 0.72. No issues with bleeding per d/w RN. CBC is stable.   Goal of Therapy:  Heparin level 0.3-0.7 units/ml aPTT 66-102 seconds Monitor platelets by anticoagulation protocol: Yes   Plan:  Reduce heparin gtt to 1050 units/hr Daily heparin level F/U PEG tube placement (tentatively 1/17) - IR to  evaluate when heparin is to be held prior.  Rexford Maus, PharmD, BCPS 03/21/2023 9:00 AM

## 2023-03-21 NOTE — Progress Notes (Signed)
   03/21/23 2119  Assess: MEWS Score  Temp (!) 101.2 F (38.4 C) (tylenol given)  BP 111/68  MAP (mmHg) 80  Pulse Rate (!) 121  Resp 19  SpO2 100 %  Assess: MEWS Score  MEWS Temp 1  MEWS Systolic 0  MEWS Pulse 2  MEWS RR 0  MEWS LOC 0  MEWS Score 3  MEWS Score Color Yellow  Assess: if the MEWS score is Yellow or Red  Were vital signs accurate and taken at a resting state? Yes  Does the patient meet 2 or more of the SIRS criteria? Yes  Does the patient have a confirmed or suspected source of infection? No  MEWS guidelines implemented  Yes, yellow  Treat  MEWS Interventions Considered administering scheduled or prn medications/treatments as ordered  Take Vital Signs  Increase Vital Sign Frequency  Yellow: Q2hr x1, continue Q4hrs until patient remains green for 12hrs  Escalate  MEWS: Escalate Yellow: Discuss with charge nurse and consider notifying provider and/or RRT  Notify: Charge Nurse/RN  Name of Charge Nurse/RN Notified Phil  Provider Notification  Provider Name/Title Dr. Loney Loh  Date Provider Notified 03/21/23  Time Provider Notified 2125  Method of Notification  (amion)  Notification Reason Other (Comment) (amion)  Provider response Other (Comment) (awaiting response.)  Assess: SIRS CRITERIA  SIRS Temperature  1  SIRS Respirations  0  SIRS Pulse 1  SIRS WBC 0  SIRS Score Sum  2

## 2023-03-21 NOTE — Plan of Care (Signed)
  Problem: Nutrition: Goal: Risk of aspiration will decrease Outcome: Progressing   Problem: Coping: Goal: Level of anxiety will decrease Outcome: Progressing

## 2023-03-21 NOTE — Progress Notes (Signed)
Progress Note   Patient: Jacqueline [name] AOZ:[id number] DOB: 04/17/2000 DOA: 02/21/2023     28 DOS: the patient was seen and examined on 03/21/2023 at 11:48AM      Brief hospital course: 23 y.o. F with hx thrombocytopenia, post-partum hemorrhage, PE and positive lupus AC lost to Hematology follow up who presented with stroke.   Night before admission, drank a significant amount of alcohol and took some Suboxone that was not prescribed to her.     In the morning, had expressive aphasia and was brought to ER.  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  She went to IR for mechanical thrombectomy, but this was unfortunately unsuccessful.   Patient was agitated for the first 4-5 days in the ICU, required Precedex.  12/26 MRI brain showed extension of infarct, now very large.       Assessment and Plan: Acute ischemic stroke, likely related to hypercoagulability from lupus anticoagulant Presented with CTA showing occluded left proximal M2 MCA, and MRI with large left MCA territory infarct.  Underwent thrombectomy which seemed initially successful, but then had worsening stenosis and subsequent complete closure of left M2  Repeat MRI showed extension of stroke on hospital day 5.  EEG with severe encephalopathy, no seizure. LDL 42, A1c normal Known lupus anticoagulant, last hematology follow-up, no longer on anticoagulation. Has dense aphasia which impacts ability to participate in rehab - Transition to heparin infusion given plans for PEG tube 1/17.  - Continue attempts at physical therapy     Cognitive impairment As a result of her extensive stroke, the patient has had minimal oral intake.  Seems to swallow chopped diet well without aspiration, but cognition limits oral intake.  Calorie count Jan 10-13 showed <10% caloric needs. - Continue tube feeds and free water, now with PEG -  dietitian following    Aphasia Likely expressive and receptive, however patient able to follow  commands this AM.   Her crying and inability to show sustained cooperation in care is likely a reflection of aphasia.  She was on Precedex and Depakene earlier in this hospital stay, but these were stopped due to agitation. - Continue clonazepam to nightly use - Continue Topamax as recommended by PM&R 12/31 - Continue gabapentin (as recommended by PM&R) - Avoid haldol or benzodiazepines for sedation, as these impair the patient from calling out if she is soiled or in pain   Lupus anticoagulant History of pulmonary embolism - Transition patient to Eliquis from heparin infusion this p.m. -Follow-up with rheumatology in outpatient setting    Anemia Iron deficient Thrombocytopenia Chronic thrombocytopenia.  Seen by Hematology during index hospitalization 2/24 (thrombocytopenia attributed to "nutritional deficiencies") and then she was lost to follow up.  Seen again by hematology this admission, no explanation for thrombocytopenia proposed.  Platelets up to 121 now trending down. 76 this AM     Latest Ref Rng & Units 03/21/2023    8:08 AM 03/20/2023   10:47 AM 03/19/2023    5:59 AM  CBC  WBC 4.0 - 10.5 K/uL 9.1  7.9  8.0   Hemoglobin 12.0 - 15.0 g/dL 96.2  95.2  84.1   Hematocrit 36.0 - 46.0 % 35.8  34.6  35.0   Platelets 150 - 400 K/uL 76  91  106     - Continue B12, folate, MVI, iron - Outpatient hematology and rheumatology follow up        Subjective: Remains non verbal but able to follow simple commands.  Physical Exam: BP 117/81 (BP Location: Left Arm)   Pulse 86   Temp 99.3 F (37.4 C) (Oral)   Resp 20   Ht 5\' 4"  (1.626 m)   Wt 76.5 kg   SpO2 97%   BMI 28.95 kg/m     Physical Exam  Constitutional: In no distress.  Cardiovascular: Normal rate, regular rhythm. No lower extremity edema  Pulmonary: Non labored breathing on room air, no wheezing or rales.   Abdominal: Soft. Normal bowel sounds. Non distended and non tender Musculoskeletal: Normal range of motion.      Neurological: No meaningful speech, Follows simple commands RUE/RLE contracted. 4/5 LUE/LLE  Skin: Skin is warm and dry.     Data Reviewed: No other labs.    Disposition: Status is: Inpatient Admitted with massive stroke  Now with severe physical and verbal deficits.  Awaiting CIR placement.      Author: Marolyn Haller, MD 03/21/2023 7:20 PM  For on call review [email or web address].

## 2023-03-22 ENCOUNTER — Inpatient Hospital Stay (HOSPITAL_COMMUNITY): Payer: Medicaid Other

## 2023-03-22 DIAGNOSIS — I63512 Cerebral infarction due to unspecified occlusion or stenosis of left middle cerebral artery: Secondary | ICD-10-CM | POA: Diagnosis not present

## 2023-03-22 LAB — URINALYSIS, ROUTINE W REFLEX MICROSCOPIC
Bilirubin Urine: NEGATIVE
Glucose, UA: NEGATIVE mg/dL
Hgb urine dipstick: NEGATIVE
Ketones, ur: NEGATIVE mg/dL
Leukocytes,Ua: NEGATIVE
Nitrite: NEGATIVE
Protein, ur: NEGATIVE mg/dL
Specific Gravity, Urine: 1.017 (ref 1.005–1.030)
pH: 6 (ref 5.0–8.0)

## 2023-03-22 LAB — BASIC METABOLIC PANEL
Anion gap: 9 (ref 5–15)
BUN: 13 mg/dL (ref 6–20)
CO2: 20 mmol/L — ABNORMAL LOW (ref 22–32)
Calcium: 8.9 mg/dL (ref 8.9–10.3)
Chloride: 104 mmol/L (ref 98–111)
Creatinine, Ser: 0.85 mg/dL (ref 0.44–1.00)
GFR, Estimated: >60 mL/min (ref >60)
Glucose, Bld: 133 mg/dL — ABNORMAL HIGH (ref 70–99)
Potassium: 3.6 mmol/L (ref 3.5–5.1)
Sodium: 133 mmol/L — ABNORMAL LOW (ref 135–145)

## 2023-03-22 LAB — GLUCOSE, CAPILLARY
Glucose-Capillary: 106 mg/dL — ABNORMAL HIGH (ref 70–99)
Glucose-Capillary: 109 mg/dL — ABNORMAL HIGH (ref 70–99)
Glucose-Capillary: 119 mg/dL — ABNORMAL HIGH (ref 70–99)
Glucose-Capillary: 122 mg/dL — ABNORMAL HIGH (ref 70–99)
Glucose-Capillary: 128 mg/dL — ABNORMAL HIGH (ref 70–99)
Glucose-Capillary: 139 mg/dL — ABNORMAL HIGH (ref 70–99)

## 2023-03-22 LAB — RESPIRATORY PANEL BY PCR

## 2023-03-22 LAB — CBC
HCT: 31.7 % — ABNORMAL LOW (ref 36.0–46.0)
Hemoglobin: 11.1 g/dL — ABNORMAL LOW (ref 12.0–15.0)
MCH: 27.3 pg (ref 26.0–34.0)
MCHC: 35 g/dL (ref 30.0–36.0)
MCV: 78.1 fL — ABNORMAL LOW (ref 80.0–100.0)
Platelets: 77 10*3/uL — ABNORMAL LOW (ref 150–400)
RBC: 4.06 MIL/uL (ref 3.87–5.11)
RDW: 23.1 % — ABNORMAL HIGH (ref 11.5–15.5)
WBC: 13.6 10*3/uL — ABNORMAL HIGH (ref 4.0–10.5)
nRBC: 0 % (ref 0.0–0.2)

## 2023-03-22 LAB — LACTIC ACID, PLASMA: Lactic Acid, Venous: 1.3 mmol/L (ref 0.5–1.9)

## 2023-03-22 LAB — PROCALCITONIN: Procalcitonin: 0.15 ng/mL

## 2023-03-22 MED ORDER — SODIUM CHLORIDE 0.9 % IV BOLUS
1000.0000 mL | Freq: Once | INTRAVENOUS | Status: AC
  Administered 2023-03-22: 1000 mL via INTRAVENOUS

## 2023-03-22 NOTE — Progress Notes (Signed)
Patient febrile and tachycardic tonight.  Tmax 101.2 F and was given Tylenol.  Most recent temperature 100.9 F and heart rate in the 110s. Not hypotensive.  Continue Tylenol as needed for fevers.  Workup ordered to rule out infectious etiology/sepsis: CBC, procalcitonin, lactate, urinalysis, chest x-ray, and blood cultures.  1 L normal saline bolus ordered given tachycardia.  Also sodium was slightly low at 134 on labs done 3 days ago, repeat metabolic panel ordered.

## 2023-03-22 NOTE — Progress Notes (Signed)
Referring Physician(s): Dr. Loney Loh  Supervising Physician: Roanna Banning  Patient Status:  [facility] - In-pt  Chief Complaint: Dysphagia  Subjective: Resting comfortably.  TF infusing via Gtube. NGT remains in place.  Awakens, acknowledges Clinical research associate but does not engage in exam.   Allergies: Patient has no known allergies.  Medications: Prior to Admission medications   Medication Sig Start Date End Date Taking? Authorizing Provider  acetaminophen (TYLENOL) 500 MG tablet Take 500 mg by mouth every 6 (six) hours as needed for moderate pain (pain score 4-6).   Yes [provider]  ibuprofen (ADVIL) 200 MG tablet Take 200-600 mg by mouth every 6 (six) hours as needed for moderate pain (pain score 4-6).   Yes [provider]  apixaban (ELIQUIS) 5 MG TABS tablet Take apixaban 10 mg (2 tabs) twice daily until [facility] Feb 14 then reduce to apixaban 5 mg (1 tab) twice daily Patient not taking: Reported on 02/22/2023 04/14/22   Alberteen Sam, MD     Vital Signs: BP (!) 144/92 (BP Location: Left Arm)   Pulse (!) 118   Temp 98.6 F (37 C) (Oral)   Resp 18   Ht 5\' 4"  (1.626 m)   Wt 168 lb 10.4 oz (76.5 kg)   SpO2 100%   BMI 28.95 kg/m   Physical Exam NAD, alert Abdomen:  G-tube in place.  Insertion site clean and dry. No oozing or bleeding.  Slight grimace with abdominal exam as expected.  TF infusing at goal without issue.   Imaging: DG CHEST PORT 1 VIEW Result Date: 03/22/2023 CLINICAL DATA:  Fever EXAM: PORTABLE CHEST 1 VIEW COMPARISON:  None Available. FINDINGS: Lung volumes are small, but are stable since prior examination and are clear. No pneumothorax or pleural effusion. Cardiac size within normal limits. Pulmonary vascularity is normal. Nasoenteric feeding tube has been placed with its tip at the gastroesophageal junction. IMPRESSION: 1. Pulmonary hypoinflation. 2. Nasoenteric feeding tube tip at the gastroesophageal junction. Advancement is recommended.  Electronically Signed   By: Helyn Numbers M.D.   On: 03/22/2023 01:38   IR GASTROSTOMY TUBE MOD SED Result Date: 03/21/2023 INDICATION: 23 year old female presents for gastrostomy tube placement EXAM: PERC PLACEMENT GASTROSTOMY MEDICATIONS: 2 g Ancef; Antibiotics were administered within 1 hour of the procedure. ANESTHESIA/SEDATION: Versed 1.0 mg IV; Fentanyl 50 mcg IV Moderate Sedation Time:  10 minutes The patient was continuously monitored during the procedure by the interventional radiology nurse under my direct supervision. CONTRAST:  15mL OMNIPAQUE IOHEXOL 300 MG/ML SOLN - administered into the gastric lumen. FLUOROSCOPY: Radiation Exposure Index (as provided by the fluoroscopic device): 8 mGy Kerma COMPLICATIONS: None PROCEDURE: Informed written consent was obtained from the patient and the patient's family after a thorough discussion of the procedural risks, benefits and alternatives. All questions were addressed. Maximal Sterile Barrier Technique was utilized including caps, mask, sterile gowns, sterile gloves, sterile drape, hand hygiene and skin antiseptic. A timeout was performed prior to the initiation of the procedure. The epigastrium was prepped with Betadine in a sterile fashion, and a sterile drape was applied covering the operative field. A sterile gown and sterile gloves were used for the procedure. A 5-French orogastric tube is placed under fluoroscopic guidance. Scout imaging of the abdomen confirms barium within the transverse colon. The stomach was distended with gas. Under fluoroscopic guidance, an 18 gauge needle was utilized to puncture the anterior wall of the body of the stomach. An Amplatz wire was advanced through the needle passing a T  fastener into the lumen of the stomach. The T fastener was secured for gastropexy. A 9-French sheath was inserted. A snare was advanced through the 9-French sheath. A Teena Dunk was advanced through the orogastric tube. It was snared then pulled out the  oral cavity, pulling the snare, as well. The leading edge of the gastrostomy was attached to the snare. It was then pulled down the esophagus and out the percutaneous site. Tube secured in place. Contrast was injected. Patient tolerated the procedure well and remained hemodynamically stable throughout. No complications were encountered and no significant blood loss encountered. IMPRESSION: Status post fluoroscopic placed percutaneous gastrostomy tube, with 20 [place] pull-through. Signed, Yvone Neu. Miachel Roux, RPVI [facility] Electronically Signed   By: Gilmer Mor D.O.   On: 03/21/2023 16:47   DG Abd Portable 1V Result Date: 03/21/2023 CLINICAL DATA:  Abdominal discomfort. EXAM: PORTABLE ABDOMEN - 1 VIEW COMPARISON:  March 03, 2023. FINDINGS: No abnormal bowel dilatation is noted. Feeding tube tip is seen in proximal stomach. Residual contrast is noted in nondilated colon. Moderate stool burden is noted. IMPRESSION: Moderate stool burden.  No abnormal bowel dilatation. Electronically Signed   By: Lupita Raider M.D.   On: 03/21/2023 09:56    Labs:  CBC: Recent Labs    03/19/23 0559 03/20/23 1047 03/21/23 0808 03/22/23 0619  WBC 8.0 7.9 9.1 13.6*  HGB 11.9* 11.5* 12.0 11.1*  HCT 35.0* 34.6* 35.8* 31.7*  PLT 106* 91* 76* 77*    COAGS: Recent Labs    02/21/23 1042 03/19/23 1651 03/20/23 0652 03/21/23 0808  INR 1.1  --   --  1.1  APTT 42* 82* >200*  --     BMP: Recent Labs    03/14/23 0715 03/18/23 0518 03/19/23 0559 03/22/23 0619  NA 138 138 134* 133*  K 3.7 4.0 3.7 3.6  CL 105 104 102 104  CO2 23 23 23  20*  GLUCOSE 99 108* 123* 133*  BUN 22* 17 19 13   CALCIUM 9.1 9.5 9.3 8.9  CREATININE 0.62 0.80 0.76 0.85  GFRNONAA >60 >60 >60 >60    LIVER FUNCTION TESTS: Recent Labs    02/21/23 1042 03/08/23 0634 03/10/23 0623 03/12/23 0557 03/19/23 0559  BILITOT 0.4 0.4 0.4  --  0.5  AST 20 45* 34  --   34  ALT 9 44 31  --  41  ALKPHOS 64 79 76  --  77  PROT 6.9 7.5 7.3  --  7.3  ALBUMIN 3.4* 2.9* 2.8* 2.7* 3.2*    Assessment and Plan: L MCA stroke, dysphagia S/p gastrostomy tube placement 03/21/23 by Dr. Loreta Ave Patient examined after G-tube placement yesterday.   Alert, noncommunicative.   Slight grimace with abdominal exam as expected.  TF infusing at goal.  Site intact, clean, and dry.  No further needs in IR at this time.    Electronically Signed: Hoyt Koch, PA 03/22/2023, 10:39 AM   I spent a total of 15 Minutes at the the patient's bedside AND on the patient's hospital floor or unit, greater than 50% of which was counseling/coordinating care for L MCA stroke, dysphagia.

## 2023-03-22 NOTE — Plan of Care (Signed)
Pt is alert but unable answer orientation questions. Pt uncooperative with [facility]. Pt will speak with family but not staff when asking questions. Tube feedings restarted via gtube. Pt received eliquis dose. Heparin stopped. New IV started in Right forearm. Mews flagged Dr. Loney Loh aware. Orders placed. Pt received 1 dose of tylenol and 1000 ml bolus.  Problem: Education: Goal: Knowledge of disease or condition will improve Outcome: Not Progressing Goal: Knowledge of secondary prevention will improve (MUST DOCUMENT ALL) Outcome: Not Progressing Goal: Knowledge of patient specific risk factors will improve [name] N/A or DELETE if not current risk factor) Outcome: Not Progressing   Problem: Self-Care: Goal: Ability to communicate needs accurately will improve Outcome: Not Progressing   Problem: Ischemic Stroke/TIA Tissue Perfusion: Goal: Complications of ischemic stroke/TIA will be minimized Outcome: Progressing   Problem: Coping: Goal: Will verbalize positive feelings about self Outcome: Progressing Goal: Will identify appropriate support needs Outcome: Progressing   Problem: Health Behavior/Discharge Planning: Goal: Ability to manage health-related needs will improve Outcome: Progressing Goal: Goals will be collaboratively established with patient/family Outcome: Progressing   Problem: Self-Care: Goal: Ability to participate in self-care as condition permits will improve Outcome: Progressing Goal: Verbalization of feelings and concerns over difficulty with self-care will improve Outcome: Progressing   Problem: Nutrition: Goal: Risk of aspiration will decrease Outcome: Progressing Goal: Dietary intake will improve Outcome: Progressing   Problem: Education: Goal: Knowledge of General Education information will improve Description: Including pain rating scale, medication(s)/side effects and non-pharmacologic comfort measures Outcome: Progressing   Problem: Health  Behavior/Discharge Planning: Goal: Ability to manage health-related needs will improve Outcome: Progressing   Problem: Clinical Measurements: Goal: Ability to maintain clinical measurements within normal limits will improve Outcome: Progressing Goal: Will remain free from infection Outcome: Progressing Goal: Diagnostic test results will improve Outcome: Progressing Goal: Cardiovascular complication will be avoided Outcome: Progressing   Problem: Activity: Goal: Risk for activity intolerance will decrease Outcome: Progressing   Problem: Nutrition: Goal: Adequate nutrition will be maintained Outcome: Progressing   Problem: Coping: Goal: Level of anxiety will decrease Outcome: Progressing   Problem: Elimination: Goal: Will not experience complications related to bowel motility Outcome: Progressing Goal: Will not experience complications related to urinary retention Outcome: Progressing   Problem: Pain Management: Goal: General experience of comfort will improve Outcome: Progressing   Problem: Safety: Goal: Ability to remain free from injury will improve Outcome: Progressing   Problem: Skin Integrity: Goal: Risk for impaired skin integrity will decrease Outcome: Progressing   Problem: Education: Goal: Understanding of CV disease, CV risk reduction, and recovery process will improve Outcome: Progressing Goal: Individualized Educational Video(s) Outcome: Progressing   Problem: Activity: Goal: Ability to return to baseline activity level will improve Outcome: Progressing   Problem: Cardiovascular: Goal: Ability to achieve and maintain adequate cardiovascular perfusion will improve Outcome: Progressing   Problem: Health Behavior/Discharge Planning: Goal: Ability to safely manage health-related needs after discharge will improve Outcome: Progressing

## 2023-03-22 NOTE — Progress Notes (Signed)
PROGRESS NOTE    Jacqueline [name]  VFI:[id number] DOB: [birth year] DOA: 02/21/2023 PCP: [name], MD    Brief Narrative:  23 y.o. F with hx thrombocytopenia, post-partum hemorrhage, PE and positive lupus AC lost to Hematology follow up who presented with stroke. Night before admission, drank a significant amount of alcohol and took some Suboxone that was not prescribed to her.  In the morning, had expressive aphasia and was brought to ER.  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  She went to IR for mechanical thrombectomy, but this was unfortunately unsuccessful. Patient was agitated for the first 4-5 days in the ICU, required Precedex.  12/26 MRI brain showed extension of infarct, now very large.     Prolonged hospitalization secondary to unable to eat.  Now with PEG tube placement.  Waiting to go to rehab.    Subjective: Patient seen and examined.  Profoundly aphasic.  Just answers yes to everything.  Denies any complaints.  Low-grade fever overnight.  She sounds congested.  Will check virus panel.  Assessment & Plan:   Acute ischemic stroke, likely related to hypercoagulability from lupus anticoagulant Presented with CTA showing occluded left proximal M2 MCA, and MRI with large left MCA territory infarct.  Underwent thrombectomy which seemed initially successful, but then had worsening stenosis and subsequent complete closure of left M2.  Repeat MRI showed extension of stroke on hospital day 5.  EEG with severe encephalopathy, no seizure. LDL 42, A1c normal Known lupus anticoagulant, last hematology follow-up, no longer on anticoagulation. Has dense aphasia which impacts ability to participate in rehab - Transition to heparin infusion given plans for PEG tube 1/17. Back on Eliquis - Now with PEG tube feeding.  Discontinue core track.  Transfer to CIR when bed available.    Cognitive impairment As a result of her extensive stroke, the patient has had minimal oral intake.   Allow dysphagia 3 diet with aspiration precautions. Calorie count Jan 10-13 showed <10% caloric needs. - Continue tube feeds and free water, now with PEG -  dietitian following  - Continue clonazepam to nightly use - Continue Topamax as recommended by PM&R 12/31 - Continue gabapentin (as recommended by PM&R)   Lupus anticoagulant History of pulmonary embolism - Transition patient to Eliquis . -Follow-up with rheumatology in outpatient setting    Anemia Iron deficient Thrombocytopenia Chronic thrombocytopenia.  Seen by Hematology during index hospitalization 2/24 (thrombocytopenia attributed to "nutritional deficiencies") and then she was lost to follow up.  Seen again by hematology this admission, no explanation for thrombocytopenia proposed.  Platelets up to 121 now trending down. 77 this AM.  Will need close monitoring.  - Continue B12, folate, MVI, iron - Outpatient hematology and rheumatology follow up  Low-grade fever: Developed low-grade fever since yesterday.  Blood cultures were drawn.  Patient does have some upper airway congestion and likely has viral respiratory syndrome.  Will check extended RVP panel.  Droplet precautions.  Tylenol and supportive treatment.  DVT prophylaxis: Place and maintain sequential compression device Start: 03/04/23 1826 apixaban (ELIQUIS) tablet 5 mg   Code Status: Full code Family Communication: None at the bedside Disposition Plan: Status is: Inpatient Remains inpatient appropriate because: Waiting to go to CIR     Consultants:  Neurology Critical care  Procedures:  PEG tube Thrombectomy  Antimicrobials:  Completed     Objective: Vitals:   03/21/23 2248 03/22/23 0003 03/22/23 0330 03/22/23 0743  BP: 121/77 115/77 113/68 (!) 144/92  Pulse: (!) 119 (!) 114 Marland Kitchen)  103 (!) 118  Resp: 18 18 18 18   Temp: (!) 100.7 F (38.2 C) 100.1 F (37.8 C) 100.3 F (37.9 C) 98.6 F (37 C)  TempSrc: Oral Oral Oral Oral  SpO2: 98% 99% 100%  100%  Weight:      Height:        Intake/Output Summary (Last 24 hours) at 03/22/2023 1111 Last data filed at 03/22/2023 0900 Gross per 24 hour  Intake 1320 ml  Output 1100 ml  Net 220 ml   Filed Weights   03/08/23 0500 03/13/23 0800 03/14/23 0410  Weight: 77.4 kg 62.1 kg 76.5 kg    Examination:  General exam: Anxious.  Aphasic.  Looks comfortable at rest.  Low-grade fever present. Respiratory system: Clear to auscultation.  Conducted upper airway sounds. Cardiovascular system: S1 & S2 heard, RRR. Marland Kitchen Gastrointestinal system: Soft.  Nontender.  Bowel sound present.  PEG tube intact.  Core track tube intact. Central nervous system: Alert and awake.  Profoundly aphasic.  Dense hemiplegic on the right side.  Unable to interact or answer questions.    Data Reviewed: I have personally reviewed following labs and imaging studies  CBC: Recent Labs  Lab 03/18/23 0518 03/19/23 0559 03/20/23 1047 03/21/23 0808 03/22/23 0619  WBC 8.3 8.0 7.9 9.1 13.6*  HGB 12.1 11.9* 11.5* 12.0 11.1*  HCT 36.3 35.0* 34.6* 35.8* 31.7*  MCV 78.6* 78.8* 80.8 79.7* 78.1*  PLT 121* 106* 91* 76* 77*   Basic Metabolic Panel: Recent Labs  Lab 03/18/23 0518 03/19/23 0559 03/22/23 0619  NA 138 134* 133*  K 4.0 3.7 3.6  CL 104 102 104  CO2 23 23 20*  GLUCOSE 108* 123* 133*  BUN 17 19 13   CREATININE 0.80 0.76 0.85  CALCIUM 9.5 9.3 8.9   GFR: Estimated Creatinine Clearance: 102.1 mL/min (by C-G formula based on SCr of 0.85 mg/dL). Liver Function Tests: Recent Labs  Lab 03/19/23 0559  AST 34  ALT 41  ALKPHOS 77  BILITOT 0.5  PROT 7.3  ALBUMIN 3.2*   No results for input(s): "LIPASE", "AMYLASE" in the last 168 hours. No results for input(s): "AMMONIA" in the last 168 hours. Coagulation Profile: Recent Labs  Lab 03/21/23 0808  INR 1.1   Cardiac Enzymes: No results for input(s): "CKTOTAL", "CKMB", "CKMBINDEX", "TROPONINI" in the last 168 hours. BNP (last 3 results) No results for  input(s): "PROBNP" in the last 8760 hours. HbA1C: No results for input(s): "HGBA1C" in the last 72 hours. CBG: Recent Labs  Lab 03/21/23 1704 03/21/23 2037 03/22/23 0004 03/22/23 0333 03/22/23 0737  GLUCAP 85 92 106* 139* 128*   Lipid Profile: No results for input(s): "CHOL", "HDL", "LDLCALC", "TRIG", "CHOLHDL", "LDLDIRECT" in the last 72 hours. Thyroid Function Tests: No results for input(s): "TSH", "T4TOTAL", "FREET4", "T3FREE", "THYROIDAB" in the last 72 hours. Anemia Panel: Recent Labs    03/20/23 1047  FERRITIN 63  TIBC 378  IRON 66   Sepsis Labs: Recent Labs  Lab 03/22/23 0618 03/22/23 0619  PROCALCITON  --  0.15  LATICACIDVEN 1.3  --     Recent Results (from the past 240 hours)  Culture, blood (Routine X 2) w Reflex to ID Panel     Status: None (Preliminary result)   Collection Time: 03/22/23  6:19 AM   Specimen: BLOOD  Result Value Ref Range Status   Specimen Description BLOOD BLOOD RIGHT HAND  Final   Special Requests   Final    BOTTLES DRAWN AEROBIC AND ANAEROBIC Blood Culture results  may not be optimal due to an inadequate volume of blood received in culture bottles   Culture   Final    NO GROWTH < 12 HOURS Performed at [facility], [address], [place], [place] [address]    Report Status PENDING  Incomplete  Culture, blood (Routine X 2) w Reflex to ID Panel     Status: None (Preliminary result)   Collection Time: 03/22/23  6:19 AM   Specimen: BLOOD  Result Value Ref Range Status   Specimen Description BLOOD BLOOD LEFT HAND  Final   Special Requests   Final    BOTTLES DRAWN AEROBIC AND ANAEROBIC Blood Culture results may not be optimal due to an inadequate volume of blood received in culture bottles   Culture   Final    NO GROWTH < 12 HOURS Performed at [facility], [address] [place], [place] [address]    Report Status PENDING  Incomplete         Radiology Studies: DG CHEST PORT 1 VIEW Result Date:  03/22/2023 CLINICAL DATA:  Fever EXAM: PORTABLE CHEST 1 VIEW COMPARISON:  None Available. FINDINGS: Lung volumes are small, but are stable since prior examination and are clear. No pneumothorax or pleural effusion. Cardiac size within normal limits. Pulmonary vascularity is normal. Nasoenteric feeding tube has been placed with its tip at the gastroesophageal junction. IMPRESSION: 1. Pulmonary hypoinflation. 2. Nasoenteric feeding tube tip at the gastroesophageal junction. Advancement is recommended. Electronically Signed   By: Helyn Numbers M.D.   On: 03/22/2023 01:38   IR GASTROSTOMY TUBE MOD SED Result Date: 03/21/2023 INDICATION: 23 year old female presents for gastrostomy tube placement EXAM: PERC PLACEMENT GASTROSTOMY MEDICATIONS: 2 g Ancef; Antibiotics were administered within 1 hour of the procedure. ANESTHESIA/SEDATION: Versed 1.0 mg IV; Fentanyl 50 mcg IV Moderate Sedation Time:  10 minutes The patient was continuously monitored during the procedure by the interventional radiology nurse under my direct supervision. CONTRAST:  15mL OMNIPAQUE IOHEXOL 300 MG/ML SOLN - administered into the gastric lumen. FLUOROSCOPY: Radiation Exposure Index (as provided by the fluoroscopic device): 8 mGy Kerma COMPLICATIONS: None PROCEDURE: Informed written consent was obtained from the patient and the patient's family after a thorough discussion of the procedural risks, benefits and alternatives. All questions were addressed. Maximal Sterile Barrier Technique was utilized including caps, mask, sterile gowns, sterile gloves, sterile drape, hand hygiene and skin antiseptic. A timeout was performed prior to the initiation of the procedure. The epigastrium was prepped with Betadine in a sterile fashion, and a sterile drape was applied covering the operative field. A sterile gown and sterile gloves were used for the procedure. A 5-French orogastric tube is placed under fluoroscopic guidance. Scout imaging of the abdomen  confirms barium within the transverse colon. The stomach was distended with gas. Under fluoroscopic guidance, an 18 gauge needle was utilized to puncture the anterior wall of the body of the stomach. An Amplatz wire was advanced through the needle passing a T fastener into the lumen of the stomach. The T fastener was secured for gastropexy. A 9-French sheath was inserted. A snare was advanced through the 9-French sheath. A Teena Dunk was advanced through the orogastric tube. It was snared then pulled out the oral cavity, pulling the snare, as well. The leading edge of the gastrostomy was attached to the snare. It was then pulled down the esophagus and out the percutaneous site. Tube secured in place. Contrast was injected. Patient tolerated the procedure well and remained hemodynamically stable  throughout. No complications were encountered and no significant blood loss encountered. IMPRESSION: Status post fluoroscopic placed percutaneous gastrostomy tube, with 20 [place] pull-through. Signed, Yvone Neu. Miachel Roux, RPVI [facility] Radiology Electronically Signed   By: Gilmer Mor D.O.   On: 03/21/2023 16:47   DG Abd Portable 1V Result Date: 03/21/2023 CLINICAL DATA:  Abdominal discomfort. EXAM: PORTABLE ABDOMEN - 1 VIEW COMPARISON:  March 03, 2023. FINDINGS: No abnormal bowel dilatation is noted. Feeding tube tip is seen in proximal stomach. Residual contrast is noted in nondilated colon. Moderate stool burden is noted. IMPRESSION: Moderate stool burden.  No abnormal bowel dilatation. Electronically Signed   By: Lupita Raider M.D.   On: 03/21/2023 09:56        Scheduled Meds:  apixaban  5 mg Oral BID   clonazePAM  0.5 mg Per Tube QHS   vitamin B-12  2,000 mcg Per Tube Daily   feeding supplement (PROSource TF20)  60 mL Per Tube BID   folic acid  1 mg Per Tube Daily   free water  100 mL Per Tube Q4H   gabapentin  100 mg Oral TID   iron  polysaccharides  150 mg Per Tube Daily   multivitamin  15 mL Per Tube Daily   neomycin-bacitracin-polymyxin  1 Application Topical Daily   polyethylene glycol  17 g Per Tube Daily   topiramate  25 mg Oral BID   Continuous Infusions:  feeding supplement (OSMOLITE 1.5 CAL) 1,000 mL (03/21/23 2039)     LOS: 29 days    Time spent: 40 minutes    Dorcas Carrow, MD [facility]

## 2023-03-23 DIAGNOSIS — I63512 Cerebral infarction due to unspecified occlusion or stenosis of left middle cerebral artery: Secondary | ICD-10-CM | POA: Diagnosis not present

## 2023-03-23 LAB — GLUCOSE, CAPILLARY
Glucose-Capillary: 103 mg/dL — ABNORMAL HIGH (ref 70–99)
Glucose-Capillary: 127 mg/dL — ABNORMAL HIGH (ref 70–99)
Glucose-Capillary: 133 mg/dL — ABNORMAL HIGH (ref 70–99)

## 2023-03-23 NOTE — Plan of Care (Signed)
  Problem: Education: Goal: Knowledge of secondary prevention will improve (MUST DOCUMENT ALL) Outcome: Progressing   Problem: Ischemic Stroke/TIA Tissue Perfusion: Goal: Complications of ischemic stroke/TIA will be minimized Outcome: Progressing   Problem: Coping: Goal: Will identify appropriate support needs Outcome: Progressing

## 2023-03-23 NOTE — Progress Notes (Signed)
PROGRESS NOTE    Jacqueline POZZA  [name]:[id number] DOB: 2000/11/02 DOA: 02/21/2023 PCP: [name], MD    Brief Narrative:  23 y.o. F with hx thrombocytopenia, post-partum hemorrhage, PE and positive lupus AC lost to Hematology follow up who presented with stroke. Night before admission, drank a significant amount of alcohol and took some Suboxone that was not prescribed to her.  In the morning, had expressive aphasia and was brought to ER.  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  She went to IR for mechanical thrombectomy, but this was unfortunately unsuccessful. Patient was agitated for the first 4-5 days in the ICU, required Precedex.  12/26 MRI brain showed extension of infarct, now very large.     Prolonged hospitalization secondary to unable to eat.  Now with PEG tube placement.  Waiting to go to rehab.    Subjective:  Patient seen and examined.  Her cousin was at the bedside.  Today patient is more interactive and pleasant.  Patient denies any complaints.  She thinks she can eat but she did not like the potato and scrambled eggs that was brought.  PEG tube is infusing. Encouraged her to keep eating and swallowing. Remains afebrile last 24 hours.   Assessment & Plan:   Acute ischemic stroke, likely related to hypercoagulability from lupus anticoagulant Presented with CTA showing occluded left proximal M2 MCA, and MRI with large left MCA territory infarct.  Underwent thrombectomy which seemed initially successful, but then had worsening stenosis and subsequent complete closure of left M2.  Repeat MRI showed extension of stroke on hospital day 5.  EEG with severe encephalopathy, no seizure. LDL 42, A1c normal Known lupus anticoagulant, last hematology follow-up, no longer on anticoagulation. Has dense aphasia which impacts ability to participate in rehab - Transition to heparin infusion given plans for PEG tube 1/17. Back on Eliquis - Now with PEG tube feeding.   Choletec discontinued.  She is also on dysphagia 3 diet and doing fairly well.   Cognitive impairment As a result of her extensive stroke, the patient has had minimal oral intake.  Allow dysphagia 3 diet with aspiration precautions. Calorie count Jan 10-13 showed <10% caloric needs. - Continue tube feeds and free water, now with PEG -  dietitian following  - Continue clonazepam to nightly use - Continue Topamax as recommended by PM&R 12/31 - Continue gabapentin (as recommended by PM&R)   Lupus anticoagulant History of pulmonary embolism - Transition patient to Eliquis . -Follow-up with rheumatology in outpatient setting    Anemia Iron deficient Thrombocytopenia Chronic thrombocytopenia.  Seen by Hematology during index hospitalization 2/24 (thrombocytopenia attributed to "nutritional deficiencies") and then she was lost to follow up.  Seen again by hematology this admission, no explanation for thrombocytopenia proposed.  Platelets up to 121 now trending down. 77 this AM.  Will need close monitoring.  - Continue B12, folate, MVI, iron - Outpatient hematology and rheumatology follow up  Low-grade fever: Developed low-grade fever.  Blood cultures and respiratory virus panel negative.  Improved.   DVT prophylaxis: Place and maintain sequential compression device Start: 03/04/23 1826 apixaban (ELIQUIS) tablet 5 mg   Code Status: Full code Family Communication: Cousin at the bedside.  Sister on the phone. Disposition Plan: Status is: Inpatient Remains inpatient appropriate because: Medically stable.  Go to CIR whenever bed available.     Consultants:  Neurology Critical care  Procedures:  PEG tube Thrombectomy  Antimicrobials:  Completed     Objective: Vitals:   03/22/23  1112 03/22/23 1547 03/23/23 0000 03/23/23 0400  BP: 104/61 113/73 118/75 112/70  Pulse: (!) 108 (!) 106 100 94  Resp: 18 18 18 18   Temp: 99.1 F (37.3 C) 99.1 F (37.3 C) 99.1 F (37.3 C) 98.2 F  (36.8 C)  TempSrc: Oral Oral Oral Oral  SpO2: 100% 99% 100% 100%  Weight:      Height:        Intake/Output Summary (Last 24 hours) at 03/23/2023 1045 Last data filed at 03/23/2023 0748 Gross per 24 hour  Intake 1060 ml  Output 1800 ml  Net -740 ml   Filed Weights   03/08/23 0500 03/13/23 0800 03/14/23 0410  Weight: 77.4 kg 62.1 kg 76.5 kg    Examination:  General exam: Fairly comfortable today.  Interactive with limited speech. Respiratory system: Clear to auscultation.  Cardiovascular system: S1 & S2 heard, RRR. Marland Kitchen Gastrointestinal system: Soft.  Nontender.  Bowel sound present.  PEG tube intact.   Central nervous system: Alert and awake.  Profoundly aphasic.  Dense hemiplegic on the right side.     Data Reviewed: I have personally reviewed following labs and imaging studies  CBC: Recent Labs  Lab 03/18/23 0518 03/19/23 0559 03/20/23 1047 03/21/23 0808 03/22/23 0619  WBC 8.3 8.0 7.9 9.1 13.6*  HGB 12.1 11.9* 11.5* 12.0 11.1*  HCT 36.3 35.0* 34.6* 35.8* 31.7*  MCV 78.6* 78.8* 80.8 79.7* 78.1*  PLT 121* 106* 91* 76* 77*   Basic Metabolic Panel: Recent Labs  Lab 03/18/23 0518 03/19/23 0559 03/22/23 0619  NA 138 134* 133*  K 4.0 3.7 3.6  CL 104 102 104  CO2 23 23 20*  GLUCOSE 108* 123* 133*  BUN 17 19 13   CREATININE 0.80 0.76 0.85  CALCIUM 9.5 9.3 8.9   GFR: Estimated Creatinine Clearance: 102.1 mL/min (by C-G formula based on SCr of 0.85 mg/dL). Liver Function Tests: Recent Labs  Lab 03/19/23 0559  AST 34  ALT 41  ALKPHOS 77  BILITOT 0.5  PROT 7.3  ALBUMIN 3.2*   No results for input(s): "LIPASE", "AMYLASE" in the last 168 hours. No results for input(s): "AMMONIA" in the last 168 hours. Coagulation Profile: Recent Labs  Lab 03/21/23 0808  INR 1.1   Cardiac Enzymes: No results for input(s): "CKTOTAL", "CKMB", "CKMBINDEX", "TROPONINI" in the last 168 hours. BNP (last 3 results) No results for input(s): "PROBNP" in the last 8760  hours. HbA1C: No results for input(s): "HGBA1C" in the last 72 hours. CBG: Recent Labs  Lab 03/22/23 1111 03/22/23 1546 03/22/23 2025 03/23/23 0010 03/23/23 0444  GLUCAP 122* 119* 109* 103* 133*   Lipid Profile: No results for input(s): "CHOL", "HDL", "LDLCALC", "TRIG", "CHOLHDL", "LDLDIRECT" in the last 72 hours. Thyroid Function Tests: No results for input(s): "TSH", "T4TOTAL", "FREET4", "T3FREE", "THYROIDAB" in the last 72 hours. Anemia Panel: Recent Labs    03/20/23 1047  FERRITIN 63  TIBC 378  IRON 66   Sepsis Labs: Recent Labs  Lab 03/22/23 0618 03/22/23 0619  PROCALCITON  --  0.15  LATICACIDVEN 1.3  --     Recent Results (from the past 240 hours)  Culture, blood (Routine X 2) w Reflex to ID Panel     Status: None (Preliminary result)   Collection Time: 03/22/23  6:19 AM   Specimen: BLOOD  Result Value Ref Range Status   Specimen Description BLOOD BLOOD RIGHT HAND  Final   Special Requests   Final    BOTTLES DRAWN AEROBIC AND ANAEROBIC Blood Culture results  may not be optimal due to an inadequate volume of blood received in culture bottles   Culture   Final    NO GROWTH 1 DAY Performed at [facility], [address], [place], [place] [address]    Report Status PENDING  Incomplete  Culture, blood (Routine X 2) w Reflex to ID Panel     Status: None (Preliminary result)   Collection Time: 03/22/23  6:19 AM   Specimen: BLOOD  Result Value Ref Range Status   Specimen Description BLOOD BLOOD LEFT HAND  Final   Special Requests   Final    BOTTLES DRAWN AEROBIC AND ANAEROBIC Blood Culture results may not be optimal due to an inadequate volume of blood received in culture bottles   Culture   Final    NO GROWTH 1 DAY Performed at [facility], [address] [place], [place] [address]    Report Status PENDING  Incomplete  Respiratory (~20 pathogens) panel by PCR     Status: None   Collection Time: 03/22/23  7:56 AM   Specimen: Nasopharyngeal  Swab; Respiratory  Result Value Ref Range Status   Adenovirus NOT DETECTED NOT DETECTED Final   Coronavirus 229E NOT DETECTED NOT DETECTED Final    Comment: (NOTE) The Coronavirus on the Respiratory Panel, DOES NOT test for the novel  Coronavirus (2019 nCoV)    Coronavirus HKU1 NOT DETECTED NOT DETECTED Final   Coronavirus NL63 NOT DETECTED NOT DETECTED Final   Coronavirus OC43 NOT DETECTED NOT DETECTED Final   Metapneumovirus NOT DETECTED NOT DETECTED Final   Rhinovirus / Enterovirus NOT DETECTED NOT DETECTED Final   Influenza A NOT DETECTED NOT DETECTED Final   Influenza B NOT DETECTED NOT DETECTED Final   Parainfluenza Virus 1 NOT DETECTED NOT DETECTED Final   Parainfluenza Virus 2 NOT DETECTED NOT DETECTED Final   Parainfluenza Virus 3 NOT DETECTED NOT DETECTED Final   Parainfluenza Virus 4 NOT DETECTED NOT DETECTED Final   Respiratory Syncytial Virus NOT DETECTED NOT DETECTED Final   Bordetella pertussis NOT DETECTED NOT DETECTED Final   Bordetella Parapertussis NOT DETECTED NOT DETECTED Final   Chlamydophila pneumoniae NOT DETECTED NOT DETECTED Final   Mycoplasma pneumoniae NOT DETECTED NOT DETECTED Final    Comment: Performed at [facility], [address], [place], [place] [address]         Radiology Studies: DG CHEST PORT 1 VIEW Result Date: 03/22/2023 CLINICAL DATA:  Fever EXAM: PORTABLE CHEST 1 VIEW COMPARISON:  None Available. FINDINGS: Lung volumes are small, but are stable since prior examination and are clear. No pneumothorax or pleural effusion. Cardiac size within normal limits. Pulmonary vascularity is normal. Nasoenteric feeding tube has been placed with its tip at the gastroesophageal junction. IMPRESSION: 1. Pulmonary hypoinflation. 2. Nasoenteric feeding tube tip at the gastroesophageal junction. Advancement is recommended. Electronically Signed   By: Helyn Numbers M.D.   On: 03/22/2023 01:38   IR GASTROSTOMY TUBE MOD SED Result Date:  03/21/2023 INDICATION: 23 year old female presents for gastrostomy tube placement EXAM: PERC PLACEMENT GASTROSTOMY MEDICATIONS: 2 g Ancef; Antibiotics were administered within 1 hour of the procedure. ANESTHESIA/SEDATION: Versed 1.0 mg IV; Fentanyl 50 mcg IV Moderate Sedation Time:  10 minutes The patient was continuously monitored during the procedure by the interventional radiology nurse under my direct supervision. CONTRAST:  15mL OMNIPAQUE IOHEXOL 300 MG/ML SOLN - administered into the gastric lumen. FLUOROSCOPY: Radiation Exposure Index (as provided by the fluoroscopic device): 8 mGy Kerma COMPLICATIONS:  None PROCEDURE: Informed written consent was obtained from the patient and the patient's family after a thorough discussion of the procedural risks, benefits and alternatives. All questions were addressed. Maximal Sterile Barrier Technique was utilized including caps, mask, sterile gowns, sterile gloves, sterile drape, hand hygiene and skin antiseptic. A timeout was performed prior to the initiation of the procedure. The epigastrium was prepped with Betadine in a sterile fashion, and a sterile drape was applied covering the operative field. A sterile gown and sterile gloves were used for the procedure. A 5-French orogastric tube is placed under fluoroscopic guidance. Scout imaging of the abdomen confirms barium within the transverse colon. The stomach was distended with gas. Under fluoroscopic guidance, an 18 gauge needle was utilized to puncture the anterior wall of the body of the stomach. An Amplatz wire was advanced through the needle passing a T fastener into the lumen of the stomach. The T fastener was secured for gastropexy. A 9-French sheath was inserted. A snare was advanced through the 9-French sheath. A Teena Dunk was advanced through the orogastric tube. It was snared then pulled out the oral cavity, pulling the snare, as well. The leading edge of the gastrostomy was attached to the snare. It was then  pulled down the esophagus and out the percutaneous site. Tube secured in place. Contrast was injected. Patient tolerated the procedure well and remained hemodynamically stable throughout. No complications were encountered and no significant blood loss encountered. IMPRESSION: Status post fluoroscopic placed percutaneous gastrostomy tube, with 20 [place] pull-through. Signed, Yvone Neu. Miachel Roux, RPVI [facility] Radiology Electronically Signed   By: Gilmer Mor D.O.   On: 03/21/2023 16:47        Scheduled Meds:  apixaban  5 mg Oral BID   clonazePAM  0.5 mg Per Tube QHS   vitamin B-12  2,000 mcg Per Tube Daily   feeding supplement (PROSource TF20)  60 mL Per Tube BID   folic acid  1 mg Per Tube Daily   free water  100 mL Per Tube Q4H   gabapentin  100 mg Oral TID   iron polysaccharides  150 mg Per Tube Daily   multivitamin  15 mL Per Tube Daily   neomycin-bacitracin-polymyxin  1 Application Topical Daily   polyethylene glycol  17 g Per Tube Daily   topiramate  25 mg Oral BID   Continuous Infusions:  feeding supplement (OSMOLITE 1.5 CAL) 55 mL/hr at 03/23/23 0721     LOS: 30 days    Time spent: 40 minutes    Dorcas Carrow, MD [facility]

## 2023-03-24 DIAGNOSIS — I63512 Cerebral infarction due to unspecified occlusion or stenosis of left middle cerebral artery: Secondary | ICD-10-CM | POA: Diagnosis not present

## 2023-03-24 LAB — GLUCOSE, CAPILLARY: Glucose-Capillary: 121 mg/dL — ABNORMAL HIGH (ref 70–99)

## 2023-03-24 MED ORDER — FREE WATER
150.0000 mL | Freq: Four times a day (QID) | Status: DC
  Administered 2023-03-24 – 2023-03-25 (×3): 150 mL

## 2023-03-24 MED ORDER — PROSOURCE TF20 ENFIT COMPATIBL EN LIQD
60.0000 mL | Freq: Every day | ENTERAL | Status: DC
  Administered 2023-03-25: 60 mL
  Filled 2023-03-24: qty 60

## 2023-03-24 MED ORDER — OSMOLITE 1.5 CAL PO LIQD
355.0000 mL | Freq: Four times a day (QID) | ORAL | Status: DC
  Administered 2023-03-24 – 2023-03-25 (×3): 355 mL

## 2023-03-24 NOTE — Progress Notes (Signed)
SLP  Note  Patient Details Name: ARVELLA FRONDA MRN: [id number] DOB: [birth year]   After PEG tube placement on 1/17 an incorrect diet was ordered -Dys 3/nectar. Pt previously recommended by SLP to be given thin liquids and regular solids, outside food permitted. SLP corrected diet today after dietitian noticed the error of ongoing thickened liquids.    Urian Martenson, [name] 03/24/2023, 11:13 AM

## 2023-03-24 NOTE — Plan of Care (Signed)
  Problem: Education: Goal: Knowledge of disease or condition will improve Outcome: Progressing Goal: Knowledge of secondary prevention will improve (MUST DOCUMENT ALL) Outcome: Progressing Goal: Knowledge of patient specific risk factors will improve [name] N/A or DELETE if not current risk factor) Outcome: Progressing   Problem: Ischemic Stroke/TIA Tissue Perfusion: Goal: Complications of ischemic stroke/TIA will be minimized Outcome: Progressing   Problem: Coping: Goal: Will verbalize positive feelings about self Outcome: Progressing Goal: Will identify appropriate support needs Outcome: Progressing   Problem: Health Behavior/Discharge Planning: Goal: Ability to manage health-related needs will improve Outcome: Progressing Goal: Goals will be collaboratively established with patient/family Outcome: Progressing   Problem: Self-Care: Goal: Ability to participate in self-care as condition permits will improve Outcome: Progressing Goal: Verbalization of feelings and concerns over difficulty with self-care will improve Outcome: Progressing Goal: Ability to communicate needs accurately will improve Outcome: Progressing   Problem: Nutrition: Goal: Risk of aspiration will decrease Outcome: Progressing Goal: Dietary intake will improve Outcome: Progressing   Problem: Education: Goal: Knowledge of General Education information will improve Description: Including pain rating scale, medication(s)/side effects and non-pharmacologic comfort measures Outcome: Progressing   Problem: Health Behavior/Discharge Planning: Goal: Ability to manage health-related needs will improve Outcome: Progressing   Problem: Clinical Measurements: Goal: Ability to maintain clinical measurements within normal limits will improve Outcome: Progressing Goal: Will remain free from infection Outcome: Progressing Goal: Diagnostic test results will improve Outcome: Progressing Goal: Cardiovascular  complication will be avoided Outcome: Progressing   Problem: Activity: Goal: Risk for activity intolerance will decrease Outcome: Progressing   Problem: Nutrition: Goal: Adequate nutrition will be maintained Outcome: Progressing   Problem: Coping: Goal: Level of anxiety will decrease Outcome: Progressing   Problem: Elimination: Goal: Will not experience complications related to bowel motility Outcome: Progressing Goal: Will not experience complications related to urinary retention Outcome: Progressing   Problem: Pain Management: Goal: General experience of comfort will improve Outcome: Progressing   Problem: Safety: Goal: Ability to remain free from injury will improve Outcome: Progressing   Problem: Skin Integrity: Goal: Risk for impaired skin integrity will decrease Outcome: Progressing   Problem: Education: Goal: Understanding of CV disease, CV risk reduction, and recovery process will improve Outcome: Progressing Goal: Individualized Educational Video(s) Outcome: Progressing   Problem: Activity: Goal: Ability to return to baseline activity level will improve Outcome: Progressing   Problem: Cardiovascular: Goal: Ability to achieve and maintain adequate cardiovascular perfusion will improve Outcome: Progressing   Problem: Health Behavior/Discharge Planning: Goal: Ability to safely manage health-related needs after discharge will improve Outcome: Progressing

## 2023-03-24 NOTE — Progress Notes (Signed)
Physical Therapy Treatment Patient Details Name: Jacqueline [name] MRN: [id number] DOB: 03/03/2001 Today's Date: 03/24/2023   History of Present Illness The pt is a 23 yo female presenting 12/20 with expressive aphasia. 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 Peg placed 1/17. 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    PT Comments  Pt resting in bed on arrival, making great progress towards acute goals. Pt able to make needs known throughout session with yes/no responses. Pt able to come to sit EOB with mod A with improved midline sitting. Pt continues to require mod-max A to come to stand with max cues to shift weight to L. Pt with decreased tolerance for standing mobility this session due to pain and new PEG site. Pt continues to be motivated and current plan remains appropriate to address deficits and maximize functional independence and decrease caregiver burden.     If plan is discharge home, recommend the following: Two people to help with walking and/or transfers;Two people to help with bathing/dressing/bathroom;Assistance with cooking/housework;Assistance with feeding;Direct supervision/assist for medications management;Direct supervision/assist for financial management;Assist for transportation;Help with stairs or ramp for entrance;Supervision due to cognitive status   Can travel by private vehicle        Equipment Recommendations  Other (comment)    Recommendations for Other Services       Precautions / Restrictions Precautions Precautions: Fall Precaution Comments: peg Restrictions Weight Bearing Restrictions Per Provider Order: No     Mobility  Bed Mobility Overal bed mobility: Needs Assistance Bed Mobility: Rolling, Sidelying to Sit, Sit to Supine Rolling: Mod assist Sidelying to sit: Mod assist   Sit to supine: Mod assist   General bed mobility  comments: towards R, attempting to have pt hook LLE under R to self mobilize to EOB    Transfers Overall transfer level: Needs assistance Equipment used: Rolling walker (2 wheels), 1 person hand held assist Transfers: Sit to/from Stand Sit to Stand: Mod assist, Max assist           General transfer comment: Pt following commands for safety and progressed to max A to stand from EOB with RW support modA to stand with HHA, cues for weight shif to L able to take x1 step laterally hands on assist at RLE    Ambulation/Gait               General Gait Details: NT   Stairs             Wheelchair Mobility     Tilt Bed    Modified Rankin (Stroke Patients Only) Modified Rankin (Stroke Patients Only) Pre-Morbid Rankin Score: No symptoms Modified Rankin: Severe disability     Balance Overall balance assessment: Needs assistance Sitting-balance support: Feet supported, Single extremity supported Sitting balance-Leahy Scale: Fair Sitting balance - Comments: intermittent R lateral lean, VC to correct Postural control: Right lateral lean, Posterior lean Standing balance support: Bilateral upper extremity supported Standing balance-Leahy Scale: Poor Standing balance comment: mod-max to maintain standing and shift weight to L                            Cognition Arousal: Alert Behavior During Therapy: WFL for tasks assessed/performed Overall Cognitive Status: Difficult to assess Area of Impairment: Following commands, Safety/judgement, Problem solving, Awareness  Current Attention Level: Sustained   Following Commands: Follows one step commands with increased time Safety/Judgement: Decreased awareness of safety, Decreased awareness of deficits Awareness: Intellectual Problem Solving: Slow processing, Decreased initiation, Requires verbal cues, Difficulty sequencing General Comments: Vast improvement in attention, initiation,  cognition, arousal, and command following. fairly consistent yes/no to most questions. visible frustration with not being able to verbally state needs        Exercises      General Comments General comments (skin integrity, edema, etc.): VSS, pt's autn arrived at the end of the session      Pertinent Vitals/Pain Pain Assessment Pain Assessment: Faces Faces Pain Scale: Hurts little more Pain Location: peg site Pain Descriptors / Indicators: Moaning, Grimacing, Crying Pain Intervention(s): Monitored during session, Limited activity within patient's tolerance    Home Living                          Prior Function            PT Goals (current goals can now be found in the care plan section) Acute Rehab PT Goals PT Goal Formulation: Patient unable to participate in goal setting Time For Goal Achievement: 03/24/23 Progress towards PT goals: Progressing toward goals    Frequency    Min 1X/week      PT Plan      Co-evaluation              AM-PAC PT "6 Clicks" Mobility   Outcome Measure  Help needed turning from your back to your side while in a flat bed without using bedrails?: A Lot Help needed moving from lying on your back to sitting on the side of a flat bed without using bedrails?: A Lot Help needed moving to and from a bed to a chair (including a wheelchair)?: Total Help needed standing up from a chair using your arms (e.g., wheelchair or bedside chair)?: Total Help needed to walk in hospital room?: Total Help needed climbing 3-5 steps with a railing? : Total 6 Click Score: 8    End of Session Equipment Utilized During Treatment: Gait belt Activity Tolerance: Patient tolerated treatment well;Patient limited by pain Patient left: with call bell/phone within reach;in bed;with bed alarm set Nurse Communication: Mobility status;Patient requests pain meds PT Visit Diagnosis: Other abnormalities of gait and mobility (R26.89);Hemiplegia and  hemiparesis;Other symptoms and signs involving the nervous system (R29.898) Hemiplegia - Right/Left: Right Hemiplegia - dominant/non-dominant: Dominant Hemiplegia - caused by: Cerebral infarction     Time: 1325-1350 PT Time Calculation (min) (ACUTE ONLY): 25 min  Charges:    $Therapeutic Activity: 23-37 mins PT General Charges $$ ACUTE PT VISIT: 1 Visit                     Pamla Pangle R. PTA Acute Rehabilitation Services Office: [phone]   Catalina Antigua 03/24/2023, 3:45 PM

## 2023-03-24 NOTE — Progress Notes (Addendum)
Initial Nutrition Assessment  DOCUMENTATION CODES:   Not applicable  INTERVENTION:   Regular diet per SLP recommendation. Allow boyfriend / family to bring in food. Continue TF via PEG, change to bolus feedings:  Osmolite 1.5, start with 120 ml for first bolus, increase each bolus feeding by 80 ml until reaching goal of 355 ml (1.5 cartons) QID.  Prosource TF20 60 ml once daily. Free water flushes 75 ml before each bolus feeding and 75 ml after each bolus feeding.  Provides 2210 kcal, 109 gm protein, 1686 ml free water (TF + flushes) daily. Continue MVI, vitamin B-12, folic acid, and iron supplements. Continue Mighty Shake TID with meals, each supplement provides 330 kcals and 9 grams of protein.  NUTRITION DIAGNOSIS:   Inadequate oral intake related to other (see comment) (dislike of hospital food) as evidenced by per patient/family report, meal completion < 50%.  Ongoing   GOAL:   Patient will meet greater than or equal to 90% of their needs  Met with TF  MONITOR:   TF tolerance, PO intake, Supplement acceptance, Labs, Weight trends  REASON FOR ASSESSMENT:   Consult Calorie Count  ASSESSMENT:   Pt with PMH of hemoglobin C trait, unprovoked PE not currently on anticoagulation admitted with aphasia after heavy alcohol use and recreation use of Suboxone. 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  SLP advanced diet to regular with thin liquids 1/16. S/P PEG placement 1/17. Diet was re-entered as dysphagia 3 with  nectar thick liquids after PEG placement. SLP re-ordered regular diet with thin liquids today. Patient has been eating minimally.   Patient was video chatting with her boyfriend during RD visit. He stated that she's not eating because she does not like the hospital food. He plans to bring her some outside food today.   Patient is tolerating TF  via PEG without difficulty to meet 100% of nutrition needs. RD to change to a bolus regimen to get ready for discharge.   Labs reviewed. Na 133 (1/18) CBG: [phone]  Medications reviewed and include vitamin B-12, folic acid, niferex, MVI liquid, miralax.   Admission weight: 80.4 kg (12/20) Current weight: 76.5 kg (1/10)  Diet Order:   Diet Order             Diet regular Room service appropriate? No; Fluid consistency: Thin  Diet effective now                   EDUCATION NEEDS:   No education needs have been identified at this time  Skin:  Skin Assessment: Reviewed RN Assessment  Last BM:  1/18 type 6, 7  Height:   Ht Readings from Last 1 Encounters:  02/21/23 5\' 4"  (1.626 m)    Weight:   Wt Readings from Last 1 Encounters:  03/14/23 76.5 kg    Ideal Body Weight:  54.5 kg  BMI:  Body mass index is 28.95 kg/m.  Estimated Nutritional Needs:   Kcal:  2000-2200  Protein:  100-115 grams  Fluid:  >2 L/day   Gabriel Rainwater RD, LDN, CNSC Contact Inpatient RD using Secure Chat. If unavailable, use group chat "RD Inpatient" via Secure Chat in EPIC.

## 2023-03-24 NOTE — TOC Progression Note (Signed)
Transition of Care [facility]) - Progression Note    Patient Details  Name: Jacqueline [name] MRN: [id number] Date of Birth: 2000/07/03  Transition of Care [facility]) CM/SW Contact  Kermit Balo, RN Phone Number: 03/24/2023, 11:53 AM  Clinical Narrative:     Pt to admit to CIR when bed available. TOC following.  Expected Discharge Plan: IP Rehab Facility    Expected Discharge Plan and Services                                               Social Determinants of Health (SDOH) Interventions SDOH Screenings   Food Insecurity: No Food Insecurity (03/07/2023)  Housing: Patient Unable To Answer (02/21/2023)  Transportation Needs: Patient Unable To Answer (02/21/2023)  Utilities: Patient Unable To Answer (02/21/2023)  Financial Resource Strain: Not on File (06/21/2021)   Received from [place], [place]  Physical Activity: Not on File (06/21/2021)   Received from [place], [place]  Social Connections: Not on File (11/16/2022)   Received from [facility]  Stress: Not on File (06/21/2021)   Received from [place], [place]  Tobacco Use: High Risk (02/21/2023)    Readmission Risk Interventions     No data to display

## 2023-03-24 NOTE — Plan of Care (Signed)
  Problem: Education: Goal: Knowledge of disease or condition will improve Outcome: Not Progressing Goal: Knowledge of secondary prevention will improve (MUST DOCUMENT ALL) Outcome: Not Progressing Goal: Knowledge of patient specific risk factors will improve [name] N/A or DELETE if not current risk factor) Outcome: Not Progressing   Problem: Coping: Goal: Will verbalize positive feelings about self Outcome: Not Progressing Goal: Will identify appropriate support needs Outcome: Not Progressing   Problem: Health Behavior/Discharge Planning: Goal: Goals will be collaboratively established with patient/family Outcome: Not Progressing   Problem: Self-Care: Goal: Verbalization of feelings and concerns over difficulty with self-care will improve Outcome: Not Progressing Goal: Ability to communicate needs accurately will improve Outcome: Not Progressing   Problem: Education: Goal: Knowledge of General Education information will improve Description: Including pain rating scale, medication(s)/side effects and non-pharmacologic comfort measures Outcome: Not Progressing

## 2023-03-24 NOTE — Plan of Care (Signed)
  Problem: Education: Goal: Knowledge of disease or condition will improve Outcome: Progressing   Problem: Ischemic Stroke/TIA Tissue Perfusion: Goal: Complications of ischemic stroke/TIA will be minimized Outcome: Progressing   Problem: Health Behavior/Discharge Planning: Goal: Ability to manage health-related needs will improve Outcome: Progressing Goal: Goals will be collaboratively established with patient/family Outcome: Progressing   Problem: Self-Care: Goal: Ability to participate in self-care as condition permits will improve Outcome: Progressing Goal: Ability to communicate needs accurately will improve Outcome: Progressing   Problem: Nutrition: Goal: Risk of aspiration will decrease Outcome: Progressing Goal: Dietary intake will improve Outcome: Progressing   Problem: Education: Goal: Knowledge of General Education information will improve Description: Including pain rating scale, medication(s)/side effects and non-pharmacologic comfort measures Outcome: Progressing   Problem: Health Behavior/Discharge Planning: Goal: Ability to manage health-related needs will improve Outcome: Progressing   Problem: Clinical Measurements: Goal: Ability to maintain clinical measurements within normal limits will improve Outcome: Progressing Goal: Will remain free from infection Outcome: Progressing Goal: Diagnostic test results will improve Outcome: Progressing Goal: Cardiovascular complication will be avoided Outcome: Progressing   Problem: Activity: Goal: Risk for activity intolerance will decrease Outcome: Progressing   Problem: Nutrition: Goal: Adequate nutrition will be maintained Outcome: Progressing   Problem: Coping: Goal: Level of anxiety will decrease Outcome: Progressing

## 2023-03-24 NOTE — Progress Notes (Signed)
Occupational Therapy Treatment Patient Details Name: Jacqueline [name] MRN: [id number] DOB: 06/03/2000 Today's Date: 03/24/2023   History of present illness The pt is a 23 yo female presenting 12/20 with expressive aphasia. 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 Peg placed 1/17. 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   OT comments  Pt is making great progress towards their acute OT goals. She continues to progress in communication, command following, attention and balance. Pt able to express that she wanted to don clean clothes this session. Overall she needed max A fro UB clothing manipulation, educated her on hemi-compensatory techniques. She was able to complete bed mobility towards the R with mod A and stand  EOB with mod A +2. After standing, pt reported increased peg-site pain, ice pack applied at the end of the session. OT to continue to follow acutely to facilitate progress towards established goals. Pt will continue to benefit from intensive inpatient follow up therapy, >3 hours/day after discharge.  Late addendum: Pt now with increased flexor tone in R elbow and R digits and she is not tolerating PROM. R elbow splint in room, not donned. R resting hand splint ordered, OT to place for fit and check for tolerance. See updated goals in acute care plan.        If plan is discharge home, recommend the following:  Two people to help with walking and/or transfers;Two people to help with bathing/dressing/bathroom;Assistance with cooking/housework;Direct supervision/assist for medications management;Assistance with feeding;Direct supervision/assist for financial management;Help with stairs or ramp for entrance;Assist for transportation;Supervision due to cognitive status   Equipment Recommendations  Other (comment)    Recommendations for Other Services Rehab consult     Precautions / Restrictions Precautions Precautions: Fall Precaution Comments: peg Restrictions Weight Bearing Restrictions Per Provider Order: No       Mobility Bed Mobility Overal bed mobility: Needs Assistance Bed Mobility: Rolling, Sidelying to Sit, Sit to Supine Rolling: Mod assist Sidelying to sit: Mod assist   Sit to supine: Mod assist   General bed mobility comments: towards R    Transfers Overall transfer level: Needs assistance Equipment used: 2 person hand held assist Transfers: Sit to/from Stand Sit to Stand: Mod assist, +2 physical assistance, +2 safety/equipment                 Balance Overall balance assessment: Needs assistance Sitting-balance support: Feet supported, Single extremity supported Sitting balance-Leahy Scale: Fair Sitting balance - Comments: intermittent R lateral lean, VC to correct   Standing balance support: Bilateral upper extremity supported Standing balance-Leahy Scale: Poor                             ADL either performed or assessed with clinical judgement   ADL Overall ADL's : Needs assistance/impaired                 Upper Body Dressing : Maximal assistance Upper Body Dressing Details (indicate cue type and reason): sitting EOB, educated to don over RUE first     Statistician: Moderate assistance;Stand-pivot Toilet Transfer Details (indicate cue type and reason): simulated with +2 at EOB         Functional mobility during ADLs: Moderate assistance General ADL Comments: contines to be limited by R hemi deficits and communication although continues to make great strides towards her acute goals    Extremity/Trunk  Assessment Upper Extremity Assessment Upper Extremity Assessment: RUE deficits/detail RUE Deficits / Details: no AROM noted, RUE resting in protective internal rotation with flexion. unable to PROM digits or elbow, pt reporting pain and asking therapist to stop. Soft elbow splint not  donned, pt will benefit from resting hadn splint RUE Sensation: decreased light touch;decreased proprioception RUE Coordination: decreased fine motor;decreased gross motor   Lower Extremity Assessment Lower Extremity Assessment: Defer to PT evaluation        Vision   Vision Assessment?: Vision impaired- to be further tested in functional context Additional Comments: visual attention improving   Perception Perception Perception: Not tested   Praxis Praxis Praxis: Not tested    Cognition Arousal: Alert Behavior During Therapy: [facility] for tasks assessed/performed Overall Cognitive Status: Difficult to assess Area of Impairment: Following commands, Safety/judgement, Problem solving, Awareness                   Current Attention Level: Sustained   Following Commands: Follows one step commands with increased time Safety/Judgement: Decreased awareness of safety, Decreased awareness of deficits Awareness: Intellectual Problem Solving: Slow processing, Decreased initiation, Requires verbal cues, Difficulty sequencing General Comments: Vast improvement in attention, initiation, cognition, arousal, and command following. fairly consistent yes/no to most questions. visible frustration with not being able to verbally state needs              General Comments VSS, pt's autn arrived at the end of the session    Pertinent Vitals/ Pain       Pain Assessment Pain Assessment: Faces Faces Pain Scale: Hurts little more Pain Location: peg site Pain Descriptors / Indicators: Moaning, Grimacing, Crying Pain Intervention(s): Limited activity within patient's tolerance, Monitored during session   Frequency  Min 1X/week        Progress Toward Goals  OT Goals(current goals can now be found in the care plan section)  Progress towards OT goals: Progressing toward goals  Acute Rehab OT Goals Patient Stated Goal: to change OT Goal Formulation: With patient Time For Goal  Achievement: 04/02/23 Potential to Achieve Goals: Good ADL Goals Pt Will Perform Grooming: with mod assist;sitting Pt Will Perform Upper Body Bathing: with mod assist;sitting Pt/caregiver will Perform Home Exercise Program: Right Upper extremity;With written HEP provided;Increased ROM Additional ADL Goal #1: Pt will perform bed mobility with MIN A in preparation for ADLs Additional ADL Goal #2: Pt will sequence through simple ADL tasks with MIN verbal cues   AM-PAC OT "6 Clicks" Daily Activity     Outcome Measure   Help from another person eating meals?: A Little Help from another person taking care of personal grooming?: A Lot Help from another person toileting, which includes using toliet, bedpan, or urinal?: A Lot Help from another person bathing (including washing, rinsing, drying)?: A Lot Help from another person to put on and taking off regular upper body clothing?: A Lot Help from another person to put on and taking off regular lower body clothing?: Total 6 Click Score: 12    End of Session Equipment Utilized During Treatment: Gait belt  OT Visit Diagnosis: Unsteadiness on feet (R26.81);Other abnormalities of gait and mobility (R26.89);Muscle weakness (generalized) (M62.81);Hemiplegia and hemiparesis;Cognitive communication deficit (R41.841) Hemiplegia - Right/Left: Right Hemiplegia - dominant/non-dominant: Dominant Hemiplegia - caused by: Cerebral infarction Pain - Right/Left: Right Pain - part of body: Arm;Hand   Activity Tolerance Patient tolerated treatment well   Patient Left in bed;with bed alarm set;with call bell/phone within reach   Nurse Communication Mobility  status;Need for lift equipment        Time: 1441-1505 OT Time Calculation (min): 24 min  Charges: OT General Charges $OT Visit: 1 Visit OT Treatments $Self Care/Home Management : 23-37 mins  Derenda Mis, OTR/L Acute Rehabilitation Services Office [phone] Secure Chat Communication  Preferred   Donia Pounds 03/24/2023, 3:37 PM

## 2023-03-24 NOTE — Progress Notes (Signed)
PROGRESS NOTE    Jacqueline [name]  OZD:[id number] DOB: [birth year] DOA: 02/21/2023 PCP: [name], MD    Brief Narrative:  23 y.o. F with hx thrombocytopenia, post-partum hemorrhage, PE and positive lupus AC lost to Hematology follow up who presented with stroke. Night before admission, drank a significant amount of alcohol and took some Suboxone that was not prescribed to her.  In the morning, had expressive aphasia and was brought to ER.  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  She went to IR for mechanical thrombectomy, but this was unfortunately unsuccessful. Patient was agitated for the first 4-5 days in the ICU, required Precedex.  12/26 MRI brain showed extension of infarct, now very large.     Prolonged hospitalization secondary to unable to eat.  Now with PEG tube placement.  Waiting to go to rehab. Oral appetite has improved now.    Subjective:  Patient seen and examined.  No overnight events.  She tells me that she is doing fine.  Able to eat regular diet as per speech therapist.  Waiting to go to rehab.   Assessment & Plan:   Acute ischemic stroke, likely related to hypercoagulability from lupus anticoagulant Presented with CTA showing occluded left proximal M2 MCA, and MRI with large left MCA territory infarct.  Underwent thrombectomy which seemed initially successful, but then had worsening stenosis and subsequent complete closure of left M2.  Repeat MRI showed extension of stroke on hospital day 5.  EEG with severe encephalopathy, no seizure. LDL 42, A1c normal Known lupus anticoagulant, last hematology follow-up, no longer on anticoagulation. Has dense aphasia which impacts ability to participate in rehab - Transitioned to heparin infusion given plans for PEG tube 1/17. Back on Eliquis - Now with PEG tube feeding.  Patient also on regular diet.  Hopefully she can maintain her oral intake.   Cognitive impairment - Continue tube feeds and free water, now  with PEG -  dietitian following.  Able to start on regular diet and thin liquids. - Continue clonazepam to nightly use - Continue Topamax as recommended by PM&R 12/31 - Continue gabapentin (as recommended by PM&R)   Lupus anticoagulant History of pulmonary embolism - Transition patient to Eliquis . -Follow-up with rheumatology in outpatient setting    Anemia Iron deficient Thrombocytopenia Chronic thrombocytopenia.  Seen by Hematology during index hospitalization 2/24 (thrombocytopenia attributed to "nutritional deficiencies") and then she was lost to follow up.  Seen again by hematology this admission, no explanation for thrombocytopenia proposed.  Platelets up to 121 now trending down. 77 this AM.  Will need close monitoring.  - Continue B12, folate, MVI, iron - Outpatient hematology and rheumatology follow up  Low-grade fever: Developed low-grade fever.  Blood cultures and respiratory virus panel negative.  Improved.    DVT prophylaxis: Place and maintain sequential compression device Start: 03/04/23 1826 apixaban (ELIQUIS) tablet 5 mg   Code Status: Full code Family Communication: None at the bedside. Disposition Plan: Status is: Inpatient Remains inpatient appropriate because: Medically stable.  Go to CIR whenever bed available.     Consultants:  Neurology Critical care  Procedures:  PEG tube Thrombectomy  Antimicrobials:  Completed     Objective: Vitals:   03/23/23 1943 03/23/23 2316 03/24/23 0412 03/24/23 0813  BP: 112/74 124/76 112/66 99/67  Pulse: 98  94 93  Resp: 16 16 18 18   Temp: 100 F (37.8 C) 98.4 F (36.9 C) 99.1 F (37.3 C) 98.3 F (36.8 C)  TempSrc: Oral Oral Oral  Oral  SpO2: 100% 100% 98% 100%  Weight:      Height:        Intake/Output Summary (Last 24 hours) at 03/24/2023 1123 Last data filed at 03/24/2023 0500 Gross per 24 hour  Intake 845 ml  Output 1000 ml  Net -155 ml   Filed Weights   03/08/23 0500 03/13/23 0800 03/14/23  0410  Weight: 77.4 kg 62.1 kg 76.5 kg    Examination:  General exam: Fairly comfortable today.  Interactive with limited speech. Waves thumbs up with left hand.  Respiratory system: Clear to auscultation.  Cardiovascular system: S1 & S2 heard, RRR. Marland Kitchen Gastrointestinal system: Soft.  Nontender.  Bowel sound present.  PEG tube intact.   Central nervous system: Alert and awake.  Profoundly aphasic.  Dense hemiplegic on the right side.     Data Reviewed: I have personally reviewed following labs and imaging studies  CBC: Recent Labs  Lab 03/18/23 0518 03/19/23 0559 03/20/23 1047 03/21/23 0808 03/22/23 0619  WBC 8.3 8.0 7.9 9.1 13.6*  HGB 12.1 11.9* 11.5* 12.0 11.1*  HCT 36.3 35.0* 34.6* 35.8* 31.7*  MCV 78.6* 78.8* 80.8 79.7* 78.1*  PLT 121* 106* 91* 76* 77*   Basic Metabolic Panel: Recent Labs  Lab 03/18/23 0518 03/19/23 0559 03/22/23 0619  NA 138 134* 133*  K 4.0 3.7 3.6  CL 104 102 104  CO2 23 23 20*  GLUCOSE 108* 123* 133*  BUN 17 19 13   CREATININE 0.80 0.76 0.85  CALCIUM 9.5 9.3 8.9   GFR: Estimated Creatinine Clearance: 102.1 mL/min (by C-G formula based on SCr of 0.85 mg/dL). Liver Function Tests: Recent Labs  Lab 03/19/23 0559  AST 34  ALT 41  ALKPHOS 77  BILITOT 0.5  PROT 7.3  ALBUMIN 3.2*   No results for input(s): "LIPASE", "AMYLASE" in the last 168 hours. No results for input(s): "AMMONIA" in the last 168 hours. Coagulation Profile: Recent Labs  Lab 03/21/23 0808  INR 1.1   Cardiac Enzymes: No results for input(s): "CKTOTAL", "CKMB", "CKMBINDEX", "TROPONINI" in the last 168 hours. BNP (last 3 results) No results for input(s): "PROBNP" in the last 8760 hours. HbA1C: No results for input(s): "HGBA1C" in the last 72 hours. CBG: Recent Labs  Lab 03/22/23 2025 03/23/23 0010 03/23/23 0444 03/23/23 1137 03/24/23 0808  GLUCAP 109* 103* 133* 127* 121*   Lipid Profile: No results for input(s): "CHOL", "HDL", "LDLCALC", "TRIG", "CHOLHDL",  "LDLDIRECT" in the last 72 hours. Thyroid Function Tests: No results for input(s): "TSH", "T4TOTAL", "FREET4", "T3FREE", "THYROIDAB" in the last 72 hours. Anemia Panel: No results for input(s): "VITAMINB12", "FOLATE", "FERRITIN", "TIBC", "IRON", "RETICCTPCT" in the last 72 hours.  Sepsis Labs: Recent Labs  Lab 03/22/23 0618 03/22/23 0619  PROCALCITON  --  0.15  LATICACIDVEN 1.3  --     Recent Results (from the past 240 hours)  Culture, blood (Routine X 2) w Reflex to ID Panel     Status: None (Preliminary result)   Collection Time: 03/22/23  6:19 AM   Specimen: BLOOD  Result Value Ref Range Status   Specimen Description BLOOD BLOOD RIGHT HAND  Final   Special Requests   Final    BOTTLES DRAWN AEROBIC AND ANAEROBIC Blood Culture results may not be optimal due to an inadequate volume of blood received in culture bottles   Culture   Final    NO GROWTH 2 DAYS Performed at [facility], [address] [place], [place] [address]    Report  Status PENDING  Incomplete  Culture, blood (Routine X 2) w Reflex to ID Panel     Status: None (Preliminary result)   Collection Time: 03/22/23  6:19 AM   Specimen: BLOOD  Result Value Ref Range Status   Specimen Description BLOOD BLOOD LEFT HAND  Final   Special Requests   Final    BOTTLES DRAWN AEROBIC AND ANAEROBIC Blood Culture results may not be optimal due to an inadequate volume of blood received in culture bottles   Culture   Final    NO GROWTH 2 DAYS Performed at [facility], [address], [place], [place] [address]    Report Status PENDING  Incomplete  Respiratory (~20 pathogens) panel by PCR     Status: None   Collection Time: 03/22/23  7:56 AM   Specimen: Nasopharyngeal Swab; Respiratory  Result Value Ref Range Status   Adenovirus NOT DETECTED NOT DETECTED Final   Coronavirus 229E NOT DETECTED NOT DETECTED Final    Comment: (NOTE) The Coronavirus on the Respiratory Panel, DOES NOT test for the novel  Coronavirus  (2019 nCoV)    Coronavirus HKU1 NOT DETECTED NOT DETECTED Final   Coronavirus NL63 NOT DETECTED NOT DETECTED Final   Coronavirus OC43 NOT DETECTED NOT DETECTED Final   Metapneumovirus NOT DETECTED NOT DETECTED Final   Rhinovirus / Enterovirus NOT DETECTED NOT DETECTED Final   Influenza A NOT DETECTED NOT DETECTED Final   Influenza B NOT DETECTED NOT DETECTED Final   Parainfluenza Virus 1 NOT DETECTED NOT DETECTED Final   Parainfluenza Virus 2 NOT DETECTED NOT DETECTED Final   Parainfluenza Virus 3 NOT DETECTED NOT DETECTED Final   Parainfluenza Virus 4 NOT DETECTED NOT DETECTED Final   Respiratory Syncytial Virus NOT DETECTED NOT DETECTED Final   Bordetella pertussis NOT DETECTED NOT DETECTED Final   Bordetella Parapertussis NOT DETECTED NOT DETECTED Final   Chlamydophila pneumoniae NOT DETECTED NOT DETECTED Final   Mycoplasma pneumoniae NOT DETECTED NOT DETECTED Final    Comment: Performed at [facility], [address], [place], [place] [address]         Radiology Studies: No results found.       Scheduled Meds:  apixaban  5 mg Oral BID   clonazePAM  0.5 mg Per Tube QHS   vitamin B-12  2,000 mcg Per Tube Daily   feeding supplement (PROSource TF20)  60 mL Per Tube BID   folic acid  1 mg Per Tube Daily   free water  100 mL Per Tube Q4H   gabapentin  100 mg Oral TID   iron polysaccharides  150 mg Per Tube Daily   multivitamin  15 mL Per Tube Daily   neomycin-bacitracin-polymyxin  1 Application Topical Daily   polyethylene glycol  17 g Per Tube Daily   topiramate  25 mg Oral BID   Continuous Infusions:  feeding supplement (OSMOLITE 1.5 CAL) 55 mL/hr at 03/23/23 1800     LOS: 31 days    Time spent: 40 minutes    Dorcas Carrow, MD [facility]

## 2023-03-24 NOTE — H&P (Signed)
Physical Medicine and Rehabilitation Admission H&P    Chief Complaint  Patient presents with   Code Stroke  : HPI: Jacqueline [name] is a 23 year old right-handed female with medical history significant for Kells isoimmunization pregnancy, unprovoked PE with positive lupus anticoagulant for which she stopped Eliquis in June 2024, hemoglobin C trait, anemia iron deficient thrombocytopenia, tobacco use.  Per chart review patient lives with her 2 children ages 63 and 74.  She has a boyfriend mother and sister with good support.  1 level home with 1 flight of stairs to entry.  Reportedly independent prior to admission.  Presented 02/21/2023 with expressive aphasia and right side weakness.  Per chart patient admitted to drinking a significant amount of alcohol the night prior and did take Suboxone that has not been prescribed to her.  Chemistries unremarkable except hemoglobin 8.4, alcohol of 14, urine drug screen negative.  Cranial CT scan showed mild loss of gray-[place] differentiation left parietal temporal region representing left MCA territory acute infarct versus artifact.  No acute hemorrhage.  CTA showed occluded left proximal to mid M2 MCA.  MRI follow-up showed fairly extensive acute left MCA territory and watershed territory infarcts within the left cerebral hemisphere.  Small acute right frontal parietal cortical/subcortical infarcts also present.  Underwent left common carotid arteriogram per interventional radiology with attempted mechanical thrombectomy which unfortunately was unsuccessful Dr.Deveshwar.  Echocardiogram ejection fraction of 60 to 65% no wall motion abnormalities.  Hospital course bouts of agitation requiring restraints as well as Precedex.  EEG negative for seizure but did show diffuse encephalopathy.  A repeat MRI was completed for stroke follow-up 02/27/2023 showing increased acute infarcts in left posterior frontal and parietal lobes, anterior left frontal lobe and right frontal  lobe, and additional punctate acute infarcts in left occipital, left frontal lobe and medial parietal lobe.  No restricted diffusion in the left midbrain, cerebral peduncle, thalamus and posterior limb of internal capsule.  Mild increased mass effect from the larger areas of the left posterior frontal, left temporal and left parietal lobe with known more than 3 mm of left-to-right midline shift.  Neurology continues to follow patient is currently maintained on Eliquis.  Gastrostomy tube was placed 03/21/2023 for dysphagia per interventional radiology Dr. Loreta Ave.  Her diet has since been advanced to a regular consistency.  Anemia iron deficient/thrombocytopenia.  She was last seen by hematology 2/24 for thrombocytopenia with no explanation for thrombocytopenia proposed latest platelet count 77,000 and continue to follow.  Hospital course patient did spike low-grade fever E. coli UTI placed on Rocephin x 5 days completed.  Therapy evaluations completed due to patient decreased functional mobility was admitted for a comprehensive rehab program.  Review of Systems  Constitutional:  Negative for chills and fever.  HENT:  Negative for hearing loss.   Eyes:  Negative for blurred vision and double vision.  Respiratory:  Negative for cough, shortness of breath and wheezing.   Cardiovascular:  Negative for chest pain, palpitations and leg swelling.  Gastrointestinal:  Positive for constipation. Negative for heartburn, nausea and vomiting.  Genitourinary:  Negative for dysuria, flank pain and hematuria.  Musculoskeletal:  Positive for myalgias.  Skin:  Negative for rash.  Neurological:  Positive for sensory change, speech change, weakness and headaches.  All other systems reviewed and are negative.  Past Medical History:  Diagnosis Date   Bacterial vaginitis 07/10/2016   Candida vaginitis 07/24/2016   Headache in pregnancy, antepartum, third trimester 07/24/2016   Kell isoimmunization during pregnancy  Past  Surgical History:  Procedure Laterality Date   CESAREAN SECTION N/A 09/04/2016   Procedure: CESAREAN SECTION;  Surgeon: Levie Heritage, DO;  Location: [facility] BIRTHING SU[facility];  Service: Obstetrics;  Laterality: N/A;   CESAREAN SECTION Bilateral    IR CT HEAD LTD  02/21/2023   IR GASTROSTOMY TUBE MOD SED  03/21/2023   IR PERCUTANEOUS ART THROMBECTOMY/INFUSION INTRACRANIAL INC DIAG ANGIO  02/21/2023   RADIOLOGY WITH ANESTHESIA N/A 02/21/2023   Procedure: IR WITH ANESTHESIA;  Surgeon: Julieanne Cotton, MD;  Location: [facility] OR;  Service: Radiology;  Laterality: N/A;   Family History  Problem Relation Age of Onset   Hypertension Maternal Grandmother    Diabetes Maternal Grandmother    Cancer Neg Hx    Social History:  reports that she has been smoking cigars. She has never used smokeless tobacco. She reports current alcohol use. She reports that she does not use drugs. Allergies: No Known Allergies Medications Prior to Admission  Medication Sig Dispense Refill   acetaminophen (TYLENOL) 500 MG tablet Take 500 mg by mouth every 6 (six) hours as needed for moderate pain (pain score 4-6).     ibuprofen (ADVIL) 200 MG tablet Take 200-600 mg by mouth every 6 (six) hours as needed for moderate pain (pain score 4-6).     apixaban (ELIQUIS) 5 MG TABS tablet Take apixaban 10 mg (2 tabs) twice daily until [facility] Feb 14 then reduce to apixaban 5 mg (1 tab) twice daily (Patient not taking: Reported on 02/22/2023) 66 tablet 0      Home: Home Living Family/patient expects to be discharged to:: Private residence Living Arrangements: Spouse/significant other (BD and 2 minor children) Available Help at Discharge: Family, Available 24 hours/day (boyfriend, mom, or sister) Type of Home: Apartment Home Access: Stairs to enter Secretary/administrator of Steps: 1 flight Entrance Stairs-Rails: None (walls) Home Layout: One level Bathroom Shower/Tub: Engineer, manufacturing systems: Standard Home Equipment: Grab  bars - tub/shower   Functional History: Prior Function Prior Level of Function : Independent/Modified Independent, Driving Mobility Comments: independent ADLs Comments: independent  Functional Status:  Mobility: Bed Mobility Overal bed mobility: Needs Assistance Bed Mobility: Supine to Sit, Sit to Supine Rolling: Total assist Sidelying to sit: +2 for safety/equipment, Total assist, HOB elevated Supine to sit: Max assist Sit to supine: Mod assist General bed mobility comments: pt initiating and using LUE to pull her torso up/forward; assist to move RLE over EOB and scoot out to EOB, mod A to return RLE to bed Transfers Overall transfer level: Needs assistance Equipment used: 1 person hand held assist, Rolling walker (2 wheels) Transfers: Sit to/from Stand Sit to Stand: Max assist, Mod assist Bed to/from chair/wheelchair/BSC transfer type:: Stand pivot Stand pivot transfers: Max assist, +2 physical assistance  Lateral/Scoot Transfers: +2 physical assistance, Max assist General transfer comment: Pt following commands for safety and progressed to MOD A to stand from EOB with HHA, trialing RW with pt needing max A to shift weight to L on rise with fatigue. Pt attending to RLE but minimal activation noted. Ambulation/Gait General Gait Details: NT    ADL: ADL Overall ADL's : Needs assistance/impaired Eating/Feeding: NPO Grooming: Wash/dry face, Total assistance Grooming Details (indicate cue type and reason): total A to wash face Upper Body Dressing : Maximal assistance, Sitting Upper Body Dressing Details (indicate cue type and reason): to don fresh gown. Required TOTAL A to thread RUE and verbal cues to initiate threading LUE. Required assistance tying gown. Lower Body Dressing: Total  assistance, Bed level Lower Body Dressing Details (indicate cue type and reason): to don socks from bed level Toilet Transfer: Maximal assistance, +2 for physical assistance, Stand-pivot Toilet  Transfer Details (indicate cue type and reason): simulated from bed to chair with hand held assist +2 Functional mobility during ADLs: Maximal assistance, +2 for physical assistance General ADL Comments: Signifcant improve for all aspects of ADLs, cognition, and communication. Pt express needs to change her gown and agreeable to transfers OOB.  Cognition: Cognition Overall Cognitive Status: Difficult to assess Arousal/Alertness: Awake/alert Orientation Level: Oriented to person, Oriented to place Attention: Sustained Sustained Attention:  (sustained attention to PO trials well initially) Behaviors: Other (comment) (yelling, not agitated with SLP but has been agitated with other staff) Cognition Arousal: Alert Behavior During Therapy: WFL for tasks assessed/performed Overall Cognitive Status: Difficult to assess Area of Impairment: Following commands, Safety/judgement, Problem solving, Awareness Current Attention Level: Sustained Following Commands: Follows one step commands with increased time Safety/Judgement: Decreased awareness of safety, Decreased awareness of deficits Awareness: Intellectual Problem Solving: Slow processing, Decreased initiation, Requires verbal cues, Difficulty sequencing General Comments: Vast improvement in attention, initiation, cognition, arousal, and command following. Inconsistent with yes/no with pt repeatedly saying "no" to most questions. visible frustration with not being able to verbally state needs Difficult to assess due to: Impaired communication  Physical Exam: Blood pressure 99/67, pulse 93, temperature 98.3 F (36.8 C), temperature source Oral, resp. rate 18, height 5\' 4"  (1.626 m), weight 76.5 kg, SpO2 100%, unknown if currently breastfeeding. Physical Exam Abdominal:     Comments: Urostomy tube in place  Neurological:     Comments: Patient is alert.  She does make eye contact with examiner.  Patient is aphasic but does have some spontaneous  simple word responses.     Results for orders placed or performed during the hospital encounter of 02/21/23 (from the past 48 hours)  Glucose, capillary     Status: Abnormal   Collection Time: 03/22/23  3:46 PM  Result Value Ref Range   Glucose-Capillary 119 (H) 70 - 99 mg/dL    Comment: Glucose reference range applies only to samples taken after fasting for at least 8 hours.  Glucose, capillary     Status: Abnormal   Collection Time: 03/22/23  8:25 PM  Result Value Ref Range   Glucose-Capillary 109 (H) 70 - 99 mg/dL    Comment: Glucose reference range applies only to samples taken after fasting for at least 8 hours.   Comment 1 Notify RN    Comment 2 Document in Chart   Glucose, capillary     Status: Abnormal   Collection Time: 03/23/23 12:10 AM  Result Value Ref Range   Glucose-Capillary 103 (H) 70 - 99 mg/dL    Comment: Glucose reference range applies only to samples taken after fasting for at least 8 hours.   Comment 1 Notify RN    Comment 2 Document in Chart   Glucose, capillary     Status: Abnormal   Collection Time: 03/23/23  4:44 AM  Result Value Ref Range   Glucose-Capillary 133 (H) 70 - 99 mg/dL    Comment: Glucose reference range applies only to samples taken after fasting for at least 8 hours.  Glucose, capillary     Status: Abnormal   Collection Time: 03/23/23 11:37 AM  Result Value Ref Range   Glucose-Capillary 127 (H) 70 - 99 mg/dL    Comment: Glucose reference range applies only to samples taken after fasting for at least  8 hours.  Glucose, capillary     Status: Abnormal   Collection Time: 03/24/23  8:08 AM  Result Value Ref Range   Glucose-Capillary 121 (H) 70 - 99 mg/dL    Comment: Glucose reference range applies only to samples taken after fasting for at least 8 hours.   No results found.    Blood pressure 99/67, pulse 93, temperature 98.3 F (36.8 C), temperature source Oral, resp. rate 18, height 5\' 4"  (1.626 m), weight 76.5 kg, SpO2 100%, unknown if  currently breastfeeding.  Medical Problem List and Plan: 1. 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  -patient may *** shower  -ELOS/Goals: *** 2.  Antithrombotics: -DVT/anticoagulation:  Pharmaceutical: Eliquis  -antiplatelet therapy: N/A 3. Pain Management: Neurontin 100 mg 3 times daily, Topamax 25 mg twice daily, oxycodone 5 mg every 6 hours as needed 4. Mood/Behavior/Sleep: Klonopin 0.5 mg nightly, melatonin 5 mg nightly as needed  -antipsychotic agents: *** 5. Neuropsych/cognition: This patient *** capable of making decisions on *** own behalf. 6. Skin/Wound Care: *** 7. Fluids/Electrolytes/Nutrition: *** 8.  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  Followed by oncology/hematology  9.  Anemia/thrombocytopenia/iron deficiency.  Follow-up CBC.  Continue Niferex 10.  Dysphagia.  Status post gastrostomy tube 03/21/2023 per interventional radiology.  Diet has been advanced to regular.  Dietary follow-up 11.  E. coli UTI.  Antibiotic therapy completed 12.  Tobacco/alcohol use.  Counseling  Charlton Amor, PA-C 03/24/2023

## 2023-03-24 NOTE — Progress Notes (Signed)
Inpatient Rehab Admissions Coordinator:   Continuing to follow for inpatient rehab admission this week. No bed available today for this patient.   Rehab Admissons Coordinator [place], [place], [place] [phone]'

## 2023-03-25 ENCOUNTER — Inpatient Hospital Stay (HOSPITAL_COMMUNITY)
Admission: AD | Admit: 2023-03-25 | Discharge: 2023-04-23 | DRG: 056 | Disposition: A | Discharge status: Home Health | Payer: Medicaid Other | Source: Intra-hospital | Attending: Physical Medicine and Rehabilitation | Admitting: Physical Medicine and Rehabilitation

## 2023-03-25 ENCOUNTER — Other Ambulatory Visit: Payer: Self-pay

## 2023-03-25 ENCOUNTER — Encounter (HOSPITAL_COMMUNITY): Payer: Self-pay | Admitting: Physical Medicine and Rehabilitation

## 2023-03-25 DIAGNOSIS — Z87440 Personal history of urinary (tract) infections: Secondary | ICD-10-CM | POA: Diagnosis not present

## 2023-03-25 DIAGNOSIS — Z431 Encounter for attention to gastrostomy: Secondary | ICD-10-CM

## 2023-03-25 DIAGNOSIS — I69398 Other sequelae of cerebral infarction: Secondary | ICD-10-CM | POA: Diagnosis present

## 2023-03-25 DIAGNOSIS — I69391 Dysphagia following cerebral infarction: Secondary | ICD-10-CM | POA: Diagnosis not present

## 2023-03-25 DIAGNOSIS — I69351 Hemiplegia and hemiparesis following cerebral infarction affecting right dominant side: Secondary | ICD-10-CM | POA: Diagnosis not present

## 2023-03-25 DIAGNOSIS — Z931 Gastrostomy status: Secondary | ICD-10-CM | POA: Diagnosis not present

## 2023-03-25 DIAGNOSIS — E878 Other disorders of electrolyte and fluid balance, not elsewhere classified: Secondary | ICD-10-CM | POA: Diagnosis present

## 2023-03-25 DIAGNOSIS — N920 Excessive and frequent menstruation with regular cycle: Secondary | ICD-10-CM | POA: Diagnosis not present

## 2023-03-25 DIAGNOSIS — E876 Hypokalemia: Secondary | ICD-10-CM | POA: Diagnosis not present

## 2023-03-25 DIAGNOSIS — Z781 Physical restraint status: Secondary | ICD-10-CM | POA: Diagnosis not present

## 2023-03-25 DIAGNOSIS — A411 Sepsis due to other specified staphylococcus: Secondary | ICD-10-CM | POA: Diagnosis not present

## 2023-03-25 DIAGNOSIS — T85848A Pain due to other internal prosthetic devices, implants and grafts, initial encounter: Secondary | ICD-10-CM | POA: Diagnosis not present

## 2023-03-25 DIAGNOSIS — F419 Anxiety disorder, unspecified: Secondary | ICD-10-CM | POA: Diagnosis present

## 2023-03-25 DIAGNOSIS — R630 Anorexia: Secondary | ICD-10-CM | POA: Diagnosis present

## 2023-03-25 DIAGNOSIS — R509 Fever, unspecified: Secondary | ICD-10-CM | POA: Diagnosis not present

## 2023-03-25 DIAGNOSIS — K5901 Slow transit constipation: Secondary | ICD-10-CM | POA: Diagnosis not present

## 2023-03-25 DIAGNOSIS — G9340 Encephalopathy, unspecified: Secondary | ICD-10-CM | POA: Diagnosis present

## 2023-03-25 DIAGNOSIS — E871 Hypo-osmolality and hyponatremia: Secondary | ICD-10-CM | POA: Diagnosis present

## 2023-03-25 DIAGNOSIS — D519 Vitamin B12 deficiency anemia, unspecified: Secondary | ICD-10-CM | POA: Diagnosis present

## 2023-03-25 DIAGNOSIS — B9620 Unspecified Escherichia coli [E. coli] as the cause of diseases classified elsewhere: Secondary | ICD-10-CM | POA: Diagnosis present

## 2023-03-25 DIAGNOSIS — R7401 Elevation of levels of liver transaminase levels: Secondary | ICD-10-CM | POA: Diagnosis present

## 2023-03-25 DIAGNOSIS — Y833 Surgical operation with formation of external stoma as the cause of abnormal reaction of the patient, or of later complication, without mention of misadventure at the time of the procedure: Secondary | ICD-10-CM | POA: Diagnosis not present

## 2023-03-25 DIAGNOSIS — Z79899 Other long term (current) drug therapy: Secondary | ICD-10-CM

## 2023-03-25 DIAGNOSIS — I959 Hypotension, unspecified: Secondary | ICD-10-CM | POA: Diagnosis not present

## 2023-03-25 DIAGNOSIS — K9422 Gastrostomy infection: Secondary | ICD-10-CM | POA: Diagnosis not present

## 2023-03-25 DIAGNOSIS — R6510 Systemic inflammatory response syndrome (SIRS) of non-infectious origin without acute organ dysfunction: Secondary | ICD-10-CM | POA: Diagnosis present

## 2023-03-25 DIAGNOSIS — I69320 Aphasia following cerebral infarction: Secondary | ICD-10-CM

## 2023-03-25 DIAGNOSIS — Z833 Family history of diabetes mellitus: Secondary | ICD-10-CM | POA: Diagnosis not present

## 2023-03-25 DIAGNOSIS — F17290 Nicotine dependence, other tobacco product, uncomplicated: Secondary | ICD-10-CM | POA: Diagnosis present

## 2023-03-25 DIAGNOSIS — Z1152 Encounter for screening for COVID-19: Secondary | ICD-10-CM | POA: Diagnosis not present

## 2023-03-25 DIAGNOSIS — G9389 Other specified disorders of brain: Secondary | ICD-10-CM | POA: Diagnosis present

## 2023-03-25 DIAGNOSIS — T85848D Pain due to other internal prosthetic devices, implants and grafts, subsequent encounter: Secondary | ICD-10-CM

## 2023-03-25 DIAGNOSIS — E538 Deficiency of other specified B group vitamins: Secondary | ICD-10-CM | POA: Diagnosis present

## 2023-03-25 DIAGNOSIS — I69392 Facial weakness following cerebral infarction: Secondary | ICD-10-CM

## 2023-03-25 DIAGNOSIS — R451 Restlessness and agitation: Secondary | ICD-10-CM | POA: Diagnosis not present

## 2023-03-25 DIAGNOSIS — D696 Thrombocytopenia, unspecified: Secondary | ICD-10-CM | POA: Diagnosis present

## 2023-03-25 DIAGNOSIS — I69322 Dysarthria following cerebral infarction: Secondary | ICD-10-CM

## 2023-03-25 DIAGNOSIS — Z8249 Family history of ischemic heart disease and other diseases of the circulatory system: Secondary | ICD-10-CM

## 2023-03-25 DIAGNOSIS — K567 Ileus, unspecified: Secondary | ICD-10-CM | POA: Diagnosis present

## 2023-03-25 DIAGNOSIS — Z7901 Long term (current) use of anticoagulants: Secondary | ICD-10-CM | POA: Diagnosis not present

## 2023-03-25 DIAGNOSIS — D6862 Lupus anticoagulant syndrome: Secondary | ICD-10-CM | POA: Diagnosis present

## 2023-03-25 DIAGNOSIS — Z86711 Personal history of pulmonary embolism: Secondary | ICD-10-CM | POA: Diagnosis not present

## 2023-03-25 DIAGNOSIS — K9423 Gastrostomy malfunction: Secondary | ICD-10-CM | POA: Diagnosis not present

## 2023-03-25 DIAGNOSIS — N390 Urinary tract infection, site not specified: Secondary | ICD-10-CM | POA: Diagnosis present

## 2023-03-25 DIAGNOSIS — D72829 Elevated white blood cell count, unspecified: Secondary | ICD-10-CM | POA: Diagnosis not present

## 2023-03-25 DIAGNOSIS — I63512 Cerebral infarction due to unspecified occlusion or stenosis of left middle cerebral artery: Secondary | ICD-10-CM

## 2023-03-25 DIAGNOSIS — K6289 Other specified diseases of anus and rectum: Secondary | ICD-10-CM | POA: Diagnosis not present

## 2023-03-25 DIAGNOSIS — D62 Acute posthemorrhagic anemia: Secondary | ICD-10-CM | POA: Diagnosis not present

## 2023-03-25 DIAGNOSIS — R748 Abnormal levels of other serum enzymes: Secondary | ICD-10-CM | POA: Diagnosis not present

## 2023-03-25 DIAGNOSIS — M25371 Other instability, right ankle: Secondary | ICD-10-CM | POA: Diagnosis not present

## 2023-03-25 DIAGNOSIS — D509 Iron deficiency anemia, unspecified: Secondary | ICD-10-CM | POA: Diagnosis present

## 2023-03-25 DIAGNOSIS — R7881 Bacteremia: Secondary | ICD-10-CM | POA: Insufficient documentation

## 2023-03-25 DIAGNOSIS — M21331 Wrist drop, right wrist: Secondary | ICD-10-CM | POA: Diagnosis not present

## 2023-03-25 HISTORY — DX: Cerebral infarction due to unspecified occlusion or stenosis of left middle cerebral artery: I63.512

## 2023-03-25 LAB — GLUCOSE, CAPILLARY: Glucose-Capillary: 110 mg/dL — ABNORMAL HIGH (ref 70–99)

## 2023-03-25 MED ORDER — FREE WATER
200.0000 mL | Freq: Four times a day (QID) | Status: DC
  Administered 2023-03-25 – 2023-03-26 (×2): 200 mL

## 2023-03-25 MED ORDER — TRIPLE ANTIBIOTIC 3.5-400-5000 EX OINT
1.0000 | TOPICAL_OINTMENT | Freq: Every day | CUTANEOUS | Status: AC
  Administered 2023-03-26 – 2023-03-27 (×2): 1 via TOPICAL
  Filled 2023-03-25 (×2): qty 1

## 2023-03-25 MED ORDER — CLONAZEPAM 0.5 MG PO TABS
0.5000 mg | ORAL_TABLET | Freq: Every day | ORAL | Status: DC
  Administered 2023-03-25: 0.5 mg
  Filled 2023-03-25: qty 1

## 2023-03-25 MED ORDER — TOPIRAMATE 25 MG PO TABS
25.0000 mg | ORAL_TABLET | Freq: Two times a day (BID) | ORAL | Status: DC
  Administered 2023-03-25 – 2023-03-26 (×2): 25 mg
  Filled 2023-03-25 (×2): qty 1

## 2023-03-25 MED ORDER — OSMOLITE 1.5 CAL PO LIQD
355.0000 mL | Freq: Four times a day (QID) | ORAL | Status: DC
  Administered 2023-03-25 – 2023-03-26 (×3): 355 mL

## 2023-03-25 MED ORDER — APIXABAN 5 MG PO TABS
5.0000 mg | ORAL_TABLET | Freq: Two times a day (BID) | ORAL | Status: DC
  Administered 2023-03-25 – 2023-03-26 (×2): 5 mg
  Filled 2023-03-25 (×2): qty 1

## 2023-03-25 MED ORDER — ACETAMINOPHEN 650 MG RE SUPP: 650.0000 mg | RECTAL | Status: DC | PRN

## 2023-03-25 MED ORDER — APIXABAN 5 MG PO TABS: 5.0000 mg | ORAL_TABLET | Freq: Two times a day (BID) | ORAL | Status: DC

## 2023-03-25 MED ORDER — CYANOCOBALAMIN 2000 MCG PO TABS: 2000.0000 ug | ORAL_TABLET | Freq: Every day | ORAL | Status: DC

## 2023-03-25 MED ORDER — OXYCODONE HCL 5 MG PO TABS
5.0000 mg | ORAL_TABLET | ORAL | Status: DC | PRN
  Administered 2023-03-25 – 2023-03-26 (×4): 5 mg
  Filled 2023-03-25 (×4): qty 1

## 2023-03-25 MED ORDER — TOPIRAMATE 25 MG PO TABS: 25.0000 mg | ORAL_TABLET | Freq: Two times a day (BID) | ORAL | Status: DC

## 2023-03-25 MED ORDER — FREE WATER
150.0000 mL | Freq: Four times a day (QID) | Status: DC
  Administered 2023-03-25: 150 mL

## 2023-03-25 MED ORDER — ACETAMINOPHEN 325 MG PO TABS
650.0000 mg | ORAL_TABLET | ORAL | Status: DC | PRN
  Administered 2023-03-26 – 2023-04-22 (×36): 650 mg via ORAL
  Filled 2023-03-25 (×38): qty 2

## 2023-03-25 MED ORDER — POLYSACCHARIDE IRON COMPLEX 150 MG PO CAPS
150.0000 mg | ORAL_CAPSULE | Freq: Every day | ORAL | Status: DC
  Administered 2023-03-26: 150 mg
  Filled 2023-03-25: qty 1

## 2023-03-25 MED ORDER — FREE WATER: 150.0000 mL | Freq: Four times a day (QID) | Status: DC

## 2023-03-25 MED ORDER — FOLIC ACID 1 MG PO TABS: 1.0000 mg | ORAL_TABLET | Freq: Every day | ORAL | Status: DC

## 2023-03-25 MED ORDER — MELATONIN 5 MG PO TABS
5.0000 mg | ORAL_TABLET | Freq: Every evening | ORAL | Status: DC | PRN
  Administered 2023-03-26: 5 mg
  Filled 2023-03-25: qty 1

## 2023-03-25 MED ORDER — FOLIC ACID 1 MG PO TABS
1.0000 mg | ORAL_TABLET | Freq: Every day | ORAL | Status: DC
  Administered 2023-03-26: 1 mg
  Filled 2023-03-25: qty 1

## 2023-03-25 MED ORDER — ACETAMINOPHEN 160 MG/5ML PO SOLN
650.0000 mg | ORAL | Status: DC | PRN
  Administered 2023-03-25 – 2023-03-26 (×2): 650 mg
  Filled 2023-03-25 (×2): qty 20.3

## 2023-03-25 MED ORDER — POLYETHYLENE GLYCOL 3350 17 G PO PACK
17.0000 g | PACK | Freq: Every day | ORAL | Status: DC
  Administered 2023-03-26: 17 g
  Filled 2023-03-25: qty 1

## 2023-03-25 MED ORDER — OXYCODONE HCL 5 MG/5ML PO SOLN: 5.0000 mg | Freq: Four times a day (QID) | ORAL | Status: DC | PRN

## 2023-03-25 MED ORDER — GABAPENTIN 250 MG/5ML PO SOLN
100.0000 mg | Freq: Three times a day (TID) | ORAL | Status: DC
  Administered 2023-03-25 – 2023-03-26 (×3): 100 mg
  Filled 2023-03-25 (×5): qty 2

## 2023-03-25 MED ORDER — POLYSACCHARIDE IRON COMPLEX 150 MG PO CAPS: 150.0000 mg | ORAL_CAPSULE | Freq: Every day | ORAL | Status: DC

## 2023-03-25 MED ORDER — VITAMIN B-12 1000 MCG PO TABS
2000.0000 ug | ORAL_TABLET | Freq: Every day | ORAL | Status: DC
  Administered 2023-03-26: 2000 ug
  Filled 2023-03-25: qty 2

## 2023-03-25 MED ORDER — POLYETHYLENE GLYCOL 3350 17 G PO PACK: 17.0000 g | PACK | Freq: Every day | ORAL | Status: DC

## 2023-03-25 MED ORDER — ADULT MULTIVITAMIN LIQUID CH
15.0000 mL | Freq: Every day | ORAL | Status: DC
  Administered 2023-03-26: 15 mL
  Filled 2023-03-25: qty 15

## 2023-03-25 MED ORDER — CLONAZEPAM 0.5 MG PO TABS: 0.5000 mg | ORAL_TABLET | Freq: Every day | ORAL | Status: DC

## 2023-03-25 MED ORDER — OSMOLITE 1.5 CAL PO LIQD: 355.0000 mL | Freq: Four times a day (QID) | ORAL | Status: DC

## 2023-03-25 MED ORDER — PROSOURCE TF20 ENFIT COMPATIBL EN LIQD
60.0000 mL | Freq: Every day | ENTERAL | Status: DC
  Administered 2023-03-26: 60 mL
  Filled 2023-03-25: qty 60

## 2023-03-25 MED ORDER — GABAPENTIN 100 MG PO CAPS: 100.0000 mg | ORAL_CAPSULE | Freq: Three times a day (TID) | ORAL | Status: DC

## 2023-03-25 MED ORDER — OXYCODONE HCL 5 MG PO TABS: 5.0000 mg | ORAL_TABLET | Freq: Four times a day (QID) | ORAL | Status: DC | PRN

## 2023-03-25 MED ORDER — SODIUM CHLORIDE 0.9 % IV BOLUS
500.0000 mL | Freq: Once | INTRAVENOUS | Status: AC
  Administered 2023-03-25: 500 mL via INTRAVENOUS

## 2023-03-25 NOTE — Discharge Summary (Signed)
Physician Discharge Summary  Jacqueline [name]:[id number] DOB: Jul 06, 2000 DOA: 02/21/2023  PCP: [name], MD  Admit date: 02/21/2023 Discharge date: 03/25/2023  Admitted From: Home Disposition: Acute inpatient rehab  Recommendations for Outpatient Follow-up:  Follow up with PCP in 1-2 weeks after discharge. Neurology follow-up after discharge  Home Health: N/A Equipment/Devices: N/A  Discharge Condition: Fair CODE STATUS: Full code Diet recommendation: Regular diet, dysphagia precautions.  PEG tube feeding.  Discharge summary: 23 y.o. F with hx thrombocytopenia, post-partum hemorrhage, PE and positive lupus anticoagulant who has lost follow-up to hematology presented with a stroke.  Night before admission, drank a significant amount of alcohol and took some Suboxone that was not prescribed to her.  In the morning, had expressive aphasia and was brought to ER.  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  She went to IR for mechanical thrombectomy, but this was unfortunately unsuccessful. Patient was agitated for the first 4-5 days in the ICU, required Precedex.  12/26 MRI brain showed extension of infarct, now very large.      Prolonged hospitalization secondary to unable to eat.  Now with PEG tube placement.  Oral appetite has improved now.  Treated for following conditions.  Acute ischemic stroke, likely related to hypercoagulability from lupus anticoagulant Presented with CTA showing occluded left proximal M2 MCA, and MRI with large left MCA territory infarct.  Underwent thrombectomy which seemed initially successful, but then had worsening stenosis and subsequent complete closure of left M2.  Repeat MRI showed extension of stroke on hospital day 5.  EEG with severe encephalopathy, no seizure. LDL 42, A1c normal Known lupus anticoagulant, lost hematology follow-up, was no longer on anticoagulation. - Transitioned to heparin infusion given plans for PEG tube 1/17. Back on  Eliquis - Now with PEG tube feeding.  Patient also on regular diet.  Hopefully she can maintain her oral intake. -Patient should be on Eliquis for lifelong.  Pregnancy precautions discussed and aware.   Cognitive impairment - Continue tube feeds and free water, now with PEG -  dietitian following.  Able to start on regular diet and thin liquids. - Continue clonazepam to nightly use - Continue Topamax as recommended by PM&R 12/31 - Continue gabapentin (as recommended by PM&R)   Lupus anticoagulant History of pulmonary embolism - Transition patient to Eliquis .   Anemia Iron deficient Thrombocytopenia Chronic thrombocytopenia.  Seen by Hematology during index hospitalization 2/24 (thrombocytopenia attributed to "nutritional deficiencies") and then she was lost to follow up.  Seen again by hematology this admission, no explanation for thrombocytopenia proposed.  Platelets downtrending but remains stable.  Will need close monitoring.  On multivitamins.  Stable to transfer to CIR level of care.  Will continue to tube feeding.  Once oral intake improves, she may not need PEG tube.    Discharge Diagnoses:  Principal Problem:   Acute ischemic left MCA stroke [facility]) Active Problems:   Anemia   Middle cerebral artery embolism, left   Agitation   Urinary tract infection without hematuria   Lupus anticoagulant disorder (HCC)   Dysphagia   Vitamin B12 deficiency    Discharge Instructions  Discharge Instructions     Diet general   Complete by: As directed    Increase activity slowly   Complete by: As directed       Allergies as of 03/25/2023   No Known Allergies      Medication List     STOP taking these medications    ibuprofen 200 MG tablet  Commonly known as: ADVIL       TAKE these medications    acetaminophen 500 MG tablet Commonly known as: TYLENOL Take 500 mg by mouth every 6 (six) hours as needed for moderate pain (pain score 4-6).   apixaban 5 MG Tabs  tablet Commonly known as: ELIQUIS Take apixaban 10 mg (2 tabs) twice daily until [facility] Feb 14 then reduce to apixaban 5 mg (1 tab) twice daily   clonazePAM 0.5 MG tablet Commonly known as: KLONOPIN Place 1 tablet (0.5 mg total) into feeding tube at bedtime.   cyanocobalamin 2000 MCG tablet Place 1 tablet (2,000 mcg total) into feeding tube daily. Start taking on: March 26, 2023   feeding supplement (OSMOLITE 1.5 CAL) Liqd Place 355 mLs into feeding tube 4 (four) times daily.   folic acid 1 MG tablet Commonly known as: FOLVITE Place 1 tablet (1 mg total) into feeding tube daily. Start taking on: March 26, 2023   free water Soln Place 150 mLs into feeding tube 4 (four) times daily.   gabapentin 100 MG capsule Commonly known as: NEURONTIN Take 1 capsule (100 mg total) by mouth 3 (three) times daily.   iron polysaccharides 150 MG capsule Commonly known as: NIFEREX Place 1 capsule (150 mg total) into feeding tube daily. Start taking on: March 26, 2023   oxyCODONE 5 MG/5ML solution Commonly known as: ROXICODONE Take 5 mLs (5 mg total) by mouth every 6 (six) hours as needed (perceived pain).   polyethylene glycol 17 g packet Commonly known as: MIRALAX / GLYCOLAX Place 17 g into feeding tube daily. Start taking on: March 26, 2023   topiramate 25 MG tablet Commonly known as: TOPAMAX Take 1 tablet (25 mg total) by mouth 2 (two) times daily.        No Known Allergies  Consultations: Critical care Neurology Hematology oncology   Procedures/Studies: DG CHEST PORT 1 VIEW Result Date: 03/22/2023 CLINICAL DATA:  Fever EXAM: PORTABLE CHEST 1 VIEW COMPARISON:  None Available. FINDINGS: Lung volumes are small, but are stable since prior examination and are clear. No pneumothorax or pleural effusion. Cardiac size within normal limits. Pulmonary vascularity is normal. Nasoenteric feeding tube has been placed with its tip at the gastroesophageal junction. IMPRESSION: 1.  Pulmonary hypoinflation. 2. Nasoenteric feeding tube tip at the gastroesophageal junction. Advancement is recommended. Electronically Signed   By: Helyn Numbers M.D.   On: 03/22/2023 01:38   IR GASTROSTOMY TUBE MOD SED Result Date: 03/21/2023 INDICATION: 23 year old female presents for gastrostomy tube placement EXAM: PERC PLACEMENT GASTROSTOMY MEDICATIONS: 2 g Ancef; Antibiotics were administered within 1 hour of the procedure. ANESTHESIA/SEDATION: Versed 1.0 mg IV; Fentanyl 50 mcg IV Moderate Sedation Time:  10 minutes The patient was continuously monitored during the procedure by the interventional radiology nurse under my direct supervision. CONTRAST:  15mL OMNIPAQUE IOHEXOL 300 MG/ML SOLN - administered into the gastric lumen. FLUOROSCOPY: Radiation Exposure Index (as provided by the fluoroscopic device): 8 mGy Kerma COMPLICATIONS: None PROCEDURE: Informed written consent was obtained from the patient and the patient's family after a thorough discussion of the procedural risks, benefits and alternatives. All questions were addressed. Maximal Sterile Barrier Technique was utilized including caps, mask, sterile gowns, sterile gloves, sterile drape, hand hygiene and skin antiseptic. A timeout was performed prior to the initiation of the procedure. The epigastrium was prepped with Betadine in a sterile fashion, and a sterile drape was applied covering the operative field. A sterile gown and sterile gloves were used for the procedure.  A 5-French orogastric tube is placed under fluoroscopic guidance. Scout imaging of the abdomen confirms barium within the transverse colon. The stomach was distended with gas. Under fluoroscopic guidance, an 18 gauge needle was utilized to puncture the anterior wall of the body of the stomach. An Amplatz wire was advanced through the needle passing a T fastener into the lumen of the stomach. The T fastener was secured for gastropexy. A 9-French sheath was inserted. A snare was  advanced through the 9-French sheath. A Teena Dunk was advanced through the orogastric tube. It was snared then pulled out the oral cavity, pulling the snare, as well. The leading edge of the gastrostomy was attached to the snare. It was then pulled down the esophagus and out the percutaneous site. Tube secured in place. Contrast was injected. Patient tolerated the procedure well and remained hemodynamically stable throughout. No complications were encountered and no significant blood loss encountered. IMPRESSION: Status post fluoroscopic placed percutaneous gastrostomy tube, with 20 [place] pull-through. Signed, Yvone Neu. Miachel Roux, RPVI [facility] Radiology Electronically Signed   By: Gilmer Mor D.O.   On: 03/21/2023 16:47   DG Abd Portable 1V Result Date: 03/21/2023 CLINICAL DATA:  Abdominal discomfort. EXAM: PORTABLE ABDOMEN - 1 VIEW COMPARISON:  March 03, 2023. FINDINGS: No abnormal bowel dilatation is noted. Feeding tube tip is seen in proximal stomach. Residual contrast is noted in nondilated colon. Moderate stool burden is noted. IMPRESSION: Moderate stool burden.  No abnormal bowel dilatation. Electronically Signed   By: Lupita Raider M.D.   On: 03/21/2023 09:56   CT ABDOMEN WO CONTRAST Result Date: 03/18/2023 CLINICAL DATA:  dysphagia.  Gastrostomy tube evaluation EXAM: CT ABDOMEN WITHOUT CONTRAST TECHNIQUE: Multidetector CT imaging of the abdomen was performed following the standard protocol without IV contrast. RADIATION DOSE REDUCTION: This exam was performed according to the departmental dose-optimization program which includes automated exposure control, adjustment of the mA and/or kV according to patient size and/or use of iterative reconstruction technique. COMPARISON:  KUB, 03/03/2023.  CTA chest, 04/08/2022. FINDINGS: Lower chest: No acute abnormality. Hepatobiliary: Normal noncontrast appearance of the liver without focal  abnormality. No gallstones, gallbladder wall thickening, or biliary dilatation. Pancreas: No pancreatic ductal dilatation or surrounding inflammatory changes. Spleen: Normal in size without focal abnormality. Adrenals/Urinary Tract: Adrenal glands are unremarkable. Kidneys are normal, without renal calculi, focal lesion, or hydronephrosis. Stomach/Bowel: Stomach is decompressed and within normal limits. Small bore enteric feeding tube, with tube tip within stomach. Interposition of the splenic flexure of the LEFT upper quadrant imaged portions of the small bowel and colon are nondilated. Appendix is outside the field of view on this evaluation. No evidence of bowel wall thickening, distention, or inflammatory changes. Vascular/Lymphatic: No significant vascular findings are present. No enlarged abdominal or pelvic lymph nodes. Other: No abdominal wall hernia or abnormality. Musculoskeletal: No acute osseous findings. IMPRESSION: 1. Gastric position of small-bore enteric feeding tube. Consider advancement if transpyloric positioning is desired. 2. Intervening viscera with the splenic flexure and distal transverse colon overlying the gastric body and antrum. Anatomy is equivocal for percutaneous gastrostomy placement, with a trial of contrast opacification of colon and insufflation recommended. Final decision making is reserved for the performing physician Roanna Banning, MD [facility] Radiology Electronically Signed   By: Roanna Banning M.D.   On: 03/18/2023 13:50   VAS [place] TRANSCRANIAL DOPPLER W BUBBLES Result Date: 03/11/2023  Transcranial Doppler with Bubble Patient Name:  [facility] A Hammitt  Date of Exam:   03/07/2023 Medical Rec #: [id number]         Accession #:    [id number] Date of Birth: 2000/03/11         Patient Gender: F Patient Age:   31 years Exam Location:  [facility] Procedure:      VAS [place] TRANSCRANIAL DOPPLER W BUBBLES Referring Phys: Lanae Boast  --------------------------------------------------------------------------------  Indications: Stroke. Comparison Study: None. Performing Technologist: Shona Simpson  Examination Guidelines: A complete evaluation includes B-mode imaging, spectral Doppler, color Doppler, and power Doppler as needed of all accessible portions of each vessel. Bilateral testing is considered an integral part of a complete examination. Limited examinations for reoccurring indications may be performed as noted.  Summary:  A vascular evaluation was performed. The left middle cerebral artery was studied. An IV was inserted into the patient's right Cephalic Vein. Verbal informed consent was obtained.  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  Diagnosing physician: Delia Heady MD Electronically signed by Delia Heady MD on 03/11/2023 at 4:46:47 PM.    Final    CT HEAD WO CONTRAST ( ) Result Date: 03/03/2023 CLINICAL DATA:  Stroke, follow up. EXAM: CT HEAD WITHOUT CONTRAST TECHNIQUE: Contiguous axial images were obtained from the base of the skull through the vertex without intravenous contrast. RADIATION DOSE REDUCTION: This exam was performed according to the departmental dose-optimization program which includes automated exposure control, adjustment of the mA and/or kV according to patient size and/or use of iterative reconstruction technique. COMPARISON:  Head CT 03/01/2023. FINDINGS: Brain: Continued evolution of the infarcts in the posterior left MCA territory and left ACA-MCA border zone. No acute hemorrhage or significant mass effect. Unchanged subcortical hypoattenuation deep to the right central sulcus. No hydrocephalus or extra-axial collection. No mass effect or midline shift. 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  Skull: No calvarial fracture or suspicious bone lesion. Skull base is unremarkable. Sinuses/Orbits: No acute finding. Other: None. IMPRESSION: 1. Continued evolution of the infarcts in the posterior left MCA territory and left ACA-MCA border zone. No acute hemorrhage or significant mass effect. 2. 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 Electronically Signed   By: Orvan Falconer M.D.   On: 03/03/2023 16:57   DG Abd 1 View Result Date: 03/03/2023 CLINICAL DATA:  Abdominal pain EXAM: ABDOMEN - 1 VIEW COMPARISON:  02/28/2023 FINDINGS: Mild diffuse gaseous distention of the colon. No small bowel dilatation. No suspicious calcifications or mass effect. Unchanged position of Dobbhoff tube with tip located either within the distal stomach or proximal duodenum. IMPRESSION: 1. Mild diffuse gaseous distention of the colon, similar to prior examination. No small bowel dilatation to indicate ileus or obstruction. 2. Dobbhoff tube unchanged in position, terminating in the distal stomach or proximal duodenum. Electronically Signed   By: Acquanetta Belling M.D.   On: 03/03/2023 13:00   CT HEAD WO CONTRAST ( ) Result Date: 03/01/2023 CLINICAL DATA:  23 year old female status post code stroke presentation on 02/21/2023 with left MCA occlusion, infarcts. Evidence of progressive ischemia on MRI 02/27/2023. Subsequent encounter. EXAM: CT HEAD WITHOUT CONTRAST TECHNIQUE: Contiguous axial images were obtained from the base of the skull through the vertex without intravenous contrast. RADIATION DOSE REDUCTION: This exam was performed  according to the departmental dose-optimization program which includes automated exposure control, adjustment of the mA and/or kV according to patient size and/or use of iterative reconstruction technique. COMPARISON:  Brain MRI 02/27/2023, head CT 02/26/2023, and earlier. FINDINGS: Brain: Confluent left  hemisphere cytotoxic edema maximal in the middle and posterior left MCA territory is stable. Patchy additional anterior left MCA, right superior frontal gyrus infarcts are stable. No hemorrhagic transformation. Intracranial mass effect with 2-3 mm of rightward midline shift is stable. No ventriculomegaly. Basilar cisterns are stable, remain patent. Stable noncontrast CT appearance of the brainstem and cerebellum. Vascular: Hyperdense left MCA on series 6, image 37. Skull: Intact, negative. Sinuses/Orbits: Visualized paranasal sinuses and mastoids are stable and well aerated. Other: Right nasoenteric tube in place. No acute orbit or scalp soft tissue finding. IMPRESSION: 1. Stable left greater than right MCA territory cytotoxic edema. No malignant hemorrhagic transformation. No new vascular territory involvement from the recent MRI. 2. Stable mild intracranial mass effect with 2-3 mm of rightward midline shift. 3. 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 Electronically Signed   By: Odessa Fleming M.D.   On: 03/01/2023 06:26   DG Abd Portable 1V Result Date: 02/28/2023 CLINICAL DATA:  Feeding tube placement EXAM: PORTABLE ABDOMEN - 1 VIEW COMPARISON:  X-ray earlier 02/28/2023. FINDINGS: Interval removal of the previous NG tube. Dobbhoff tube seen with the tip crossing to the right and could be along the extreme distal stomach or very proximal duodenum. Limited x-ray for tube placement IMPRESSION: Dobbhoff tube seen with the tip crossing to the right and could be along the extreme distal stomach or very proximal duodenum. Electronically Signed   By: Karen Kays M.D.   On: 02/28/2023 13:25   DG Abd 1 View Result Date: 02/28/2023 CLINICAL DATA:  NG tube placement EXAM: ABDOMEN - 1 VIEW COMPARISON:  Earlier 02/28/2023. FINDINGS: Limited x-ray for tube placement of the upper abdomen has a tip overlying the upper stomach but side hole above the GE junction. Recommend this be advanced further into the  stomach. Minimal visualization of bowel loops in the upper abdomen. IMPRESSION: Limited x-ray for tube placement has tip overlying the upper stomach but side hole above the GE junction. Recommend this be advanced further into the stomach. Electronically Signed   By: Karen Kays M.D.   On: 02/28/2023 13:24   DG Abd 1 View Result Date: 02/28/2023 CLINICAL DATA:  Feeding tube placement. EXAM: ABDOMEN - 1 VIEW COMPARISON:  Abdominal x-ray dated February 26, 2023. FINDINGS: Enteric tube tip just inside the stomach with the distal side port in the distal esophagus. Normal bowel gas pattern. No acute osseous abnormality. IMPRESSION: 1. Enteric tube tip just inside the stomach with the distal side port in the distal esophagus. Recommend advancement. Electronically Signed   By: Obie Dredge M.D.   On: 02/28/2023 10:41   MR BRAIN WO CONTRAST Result Date: 02/27/2023 CLINICAL DATA:  Stroke, follow-up EXAM: MRI HEAD WITHOUT CONTRAST TECHNIQUE: Multiplanar, multiecho pulse sequences of the brain and surrounding structures were obtained without intravenous contrast. COMPARISON:  02/22/2023 MRI head, 02/26/2023 CT head FINDINGS: Brain: Increased size of areas of restricted diffusion and additional new areas of involvement in the left posterior frontal and parietal lobes (series 2, image 39, compared to series 2, image 39 on the 02/22/2023 exam). Additional increased involvement is also noted in the anterior left frontal lobe (series 2, images 26-36), and right frontal lobe in the watershed territory (series 2, image 37). New restricted diffusion with  ADC correlate in the left midbrain and cerebral peduncle, extending into the thalamus and posterior limb of the internal capsule (series 2, images 19-23). Additional punctate acute infarcts in the left occipital lobe (series 2, image 22), left frontal lobe (series 2, image 44), and medial left parietal lobe (series 2, images 40 and 42). These areas are associated with  increased T2 hyperintense signal, with gyral swelling and associated mass effect from the larger areas in the left posterior frontal, left temporal, and left parietal lobe, but no more than 3 mm of left-to-right midline shift. On susceptibility weighted imaging, susceptibility in the left MCA is likely related to thrombus, with additional new involvement of a more posterior left MCA branch (series 7, image 59). More punctate foci of hemosiderin deposition are associated with posterior right frontal lobe infarct, left frontal lobe infarct, and posterior left temporal lobe infarct (series 7, images 49, 65, 71, and 72), which may reflect microhemorrhage versus thrombus. No evidence of hemorrhagic conversion. The basilar cisterns remain patent. Unchanged low lying cerebellar tonsils, which extend 2 mm past the foramen magnum, unchanged pituitary within normal limits. Suspected hypodensity on the 10/14/2023 CT head in the right frontal lobe and right cerebellum was artifactual. Vascular: Normal proximal arterial flow voids. Loss of some of the more distal left MCA flow voids. Skull and upper cervical spine: Normal marrow signal. Sinuses/Orbits: Mucosal thickening in the maxillary sinuses and ethmoid air cells. No acute finding in the orbits. Other: Fluid in the left-greater-than-right mastoid air cells. IMPRESSION: 1. Increased acute infarcts in the left posterior frontal and parietal lobes, anterior left frontal lobe, and right frontal lobe, and additional punctate acute infarcts in the left occipital lobe, left frontal lobe, and medial left parietal lobe. 2. New restricted diffusion in the left midbrain, cerebral peduncle, thalamus, and posterior limb of the internal capsule may reflect early changes of wallerian degeneration. 3. Mildly increased mass effect from the larger areas in the left posterior frontal, left temporal, and left parietal lobe, with no more than 3 mm of left-to-right midline shift. 4.  Susceptibility in the left MCA is concerning for thrombus, with additional new involvement of a more posterior left MCA branch. More punctate foci of hemosiderin deposition are associated with the aforementioned infarcts, which may reflect microhemorrhage versus thromboemboli. No evidence of hemorrhagic conversion. These results will be called to the ordering clinician or representative by the Radiologist Assistant, and communication documented in the PACS or [employer]. Electronically Signed   By: Wiliam Ke M.D.   On: 02/27/2023 20:22   EEG adult Result Date: 02/27/2023 Charlsie Quest, MD     02/27/2023  7:31 AM Patient Name: Jacqueline [name] MRN: [id number] Epilepsy Attending: Charlsie Quest Referring Physician/Provider: Marvel Plan, MD Date: 02/26/2023 Duration: 33.24 mins Patient history: 23yo F with L MCA stroke and ams. EE to evaluate for seizure Level of alertness: comatose/ lethargic AEDs during EEG study: VPA, Clonazepam Technical aspects: This EEG study was done with scalp electrodes positioned according to the 10-20 International system of electrode placement. Electrical activity was reviewed with band pass filter of 1-70Hz , sensitivity of 7 uV/mm, display speed of 54mm/sec with a 60Hz  notched filter applied as appropriate. EEG data were recorded continuously and digitally stored.  Video monitoring was available and reviewed as appropriate. Description: EEG showed continuous generalized 3 to 6 Hz theta-delta slowing with overriding 15 to 18 Hz beta activity distributed symmetrically and diffusely. Hyperventilation and photic stimulation were not performed.   ABNORMALITY -  Continuous slow, generalized - Excessive beta, generalized IMPRESSION: This study is suggestive of severe diffuse encephalopathy likely related to sedation. No seizures or epileptiform discharges were seen throughout the recording. Priyanka Annabelle Harman   CT HEAD WO CONTRAST ( ) Result Date: 02/26/2023 CLINICAL DATA:   Stroke, follow-up EXAM: CT HEAD WITHOUT CONTRAST TECHNIQUE: Contiguous axial images were obtained from the base of the skull through the vertex without intravenous contrast. RADIATION DOSE REDUCTION: This exam was performed according to the departmental dose-optimization program which includes automated exposure control, adjustment of the mA and/or kV according to patient size and/or use of iterative reconstruction technique. COMPARISON:  02/23/2023 CT head FINDINGS: Brain: Increased hypodensity in known infarcts in the posterior left MCA territory and right posterior frontal lobe (series 4, image 23), with increased area of hypodensity in the anterior left temporal lobe (series 4, image 17), which was previously a smaller area of hypodensity. New hypodensity suspected in the anterior inferior right frontal lobe (series 4, image 14 and series 6, image 21). Additional possible hypodensity in the right cerebellar hemisphere (series 4, image 8), although this area is prone to artifact. No definite acute hemorrhage. No mass or midline shift. No hydrocephalus. Vascular: Redemonstrated hyperdense left M2 (series 4, image 11). No other hyperdense vessel. Skull: Negative for fracture or focal lesion. Sinuses/Orbits: No acute finding. Other: The mastoid air cells are well aerated. IMPRESSION: 1. 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 No evidence of frank hemorrhagic transformation. 2. New hypodensity suspected in the anterior inferior right frontal lobe and right cerebellum, which could represent additional acute infarct, although these areas are prone to artifact. Consider MRI for further evaluation. These results will be called to the ordering clinician or representative by the Radiologist Assistant, and communication documented in the PACS or [employer]. Electronically Signed   By: Wiliam Ke M.D.   On: 02/26/2023 23:40    DG Abd Portable 1V Result Date: 02/26/2023 CLINICAL DATA:  NG placement. EXAM: PORTABLE ABDOMEN - 1 VIEW COMPARISON:  Abdominal radiograph dated 02/24/2023. FINDINGS: Enteric tube with tip in the epigastric area likely in the body of the stomach. No bowel dilatation noted in the visualized abdomen. IMPRESSION: Enteric tube with tip in the body of the stomach. Electronically Signed   By: Elgie Collard M.D.   On: 02/26/2023 15:40   VAS [place] LOWER EXTREMITY VENOUS (DVT) Result Date: 02/26/2023  Lower Venous DVT Study Patient Name:  [facility]  Date of Exam:   02/26/2023 Medical Rec #: [id number]         Accession #:    [id number] Date of Birth: 03/01/2001         Patient Gender: F Patient Age:   61 years Exam Location:  [facility] Procedure:      VAS [place] LOWER EXTREMITY VENOUS (DVT) Referring Phys: Dewitt Hoes DE LA TORRE --------------------------------------------------------------------------------  Indications: Stroke.  Risk Factors: Immobility past pregnancy. Limitations: Patient positioning. Comparison Study: No significant changes seen since prior exam 04/08/22 Performing Technologist: Shona Simpson  Examination Guidelines: A complete evaluation includes B-mode imaging, spectral Doppler, color Doppler, and power Doppler as needed of all accessible portions of each vessel. Bilateral testing is considered an integral part of a complete examination. Limited examinations for reoccurring indications may be performed as noted. The reflux portion of the exam is performed with the patient in reverse Trendelenburg.  +---------+---------------+---------+-----------+----------+--------------+ RIGHT    CompressibilityPhasicitySpontaneityPropertiesThrombus Aging +---------+---------------+---------+-----------+----------+--------------+ CFV  Full           Yes      Yes                                 +---------+---------------+---------+-----------+----------+--------------+ SFJ      Full                                                         +---------+---------------+---------+-----------+----------+--------------+ FV Prox  Full                                                        +---------+---------------+---------+-----------+----------+--------------+ FV Mid   Full                                                        +---------+---------------+---------+-----------+----------+--------------+ FV DistalFull                                                        +---------+---------------+---------+-----------+----------+--------------+ PFV      Full                                                        +---------+---------------+---------+-----------+----------+--------------+ POP      Full           Yes      Yes                                 +---------+---------------+---------+-----------+----------+--------------+ PTV      Full                                                        +---------+---------------+---------+-----------+----------+--------------+ PERO     Full                                                        +---------+---------------+---------+-----------+----------+--------------+   +---------+---------------+---------+-----------+----------+--------------+ LEFT     CompressibilityPhasicitySpontaneityPropertiesThrombus Aging +---------+---------------+---------+-----------+----------+--------------+ CFV      Full           Yes      Yes                                 +---------+---------------+---------+-----------+----------+--------------+  SFJ      Full                                                        +---------+---------------+---------+-----------+----------+--------------+ FV Prox  Full                                                        +---------+---------------+---------+-----------+----------+--------------+ FV Mid   Full                                                         +---------+---------------+---------+-----------+----------+--------------+ FV DistalFull                                                        +---------+---------------+---------+-----------+----------+--------------+ PFV      Full                                                        +---------+---------------+---------+-----------+----------+--------------+ POP      Full           Yes      Yes                                 +---------+---------------+---------+-----------+----------+--------------+ PTV      Full                                                        +---------+---------------+---------+-----------+----------+--------------+ PERO     Full                                                        +---------+---------------+---------+-----------+----------+--------------+     Summary: BILATERAL: - No evidence of deep vein thrombosis seen in the lower extremities, bilaterally. -No evidence of popliteal cyst, bilaterally.   *See table(s) above for measurements and observations. Electronically signed by Gerarda Fraction on 02/26/2023 at 11:56:19 AM.    Final    ECHOCARDIOGRAM COMPLETE BUBBLE STUDY Result Date: 02/24/2023    ECHOCARDIOGRAM REPORT   Patient Name:   [facility] Date of Exam: 02/24/2023 Medical Rec #:  [id number]        Height:       64.0 in Accession #:    [id number]       Weight:  160.0 lb Date of Birth:  13-Apr-2000        BSA:          1.779 m Patient Age:    22 years         BP:           115/82 mmHg Patient Gender: F                HR:           95 bpm. Exam Location:  Inpatient Procedure: 2D Echo, Cardiac Doppler, Color Doppler and Saline Contrast Bubble            Study Indications:    Stroke  History:        Patient has prior history of Echocardiogram examinations, most                 recent 04/08/2022.  Sonographer:    Vern Claude Referring Phys: [id number] Malachi Carl STACK  Sonographer Comments: Image acquisition challenging  due to uncooperative patient. IMPRESSIONS  1. Left ventricular ejection fraction, by estimation, is 60 to 65%. The left ventricle has normal function. The left ventricle has no regional wall motion abnormalities. Indeterminate diastolic filling due to E-A fusion.  2. Right ventricular systolic function is normal. The right ventricular size is normal.  3. The mitral valve is normal in structure. No evidence of mitral valve regurgitation. No evidence of mitral stenosis.  4. The aortic valve is normal in structure. Aortic valve regurgitation is not visualized. No aortic stenosis is present.  5. The inferior vena cava is normal in size with greater than 50% respiratory variability, suggesting right atrial pressure of 3 mmHg.  6. Cannot exclude a small PFO. Agitated saline contrast bubble study was positive with shunting observed within 3-6 cardiac cycles suggestive of interatrial shunt. Conclusion(s)/Recommendation(s): Findings consistent with interatrial shunt, increased significantly with valsalva. FINDINGS  Left Ventricle: Left ventricular ejection fraction, by estimation, is 60 to 65%. The left ventricle has normal function. The left ventricle has no regional wall motion abnormalities. The left ventricular internal cavity size was small. There is no left ventricular hypertrophy. Indeterminate diastolic filling due to E-A fusion. Right Ventricle: The right ventricular size is normal. No increase in right ventricular wall thickness. Right ventricular systolic function is normal. Left Atrium: Left atrial size was normal in size. Right Atrium: Right atrial size was normal in size. Pericardium: There is no evidence of pericardial effusion. Mitral Valve: The mitral valve is normal in structure. No evidence of mitral valve regurgitation. No evidence of mitral valve stenosis. MV peak gradient, 3.4 mmHg. The mean mitral valve gradient is 2.0 mmHg. Tricuspid Valve: The tricuspid valve is normal in structure. Tricuspid valve  regurgitation is trivial. No evidence of tricuspid stenosis. Aortic Valve: The aortic valve is normal in structure. Aortic valve regurgitation is not visualized. No aortic stenosis is present. Aortic valve mean gradient measures 4.0 mmHg. Aortic valve peak gradient measures 6.2 mmHg. Aortic valve area, by VTI measures 1.60 cm. Pulmonic Valve: The pulmonic valve was normal in structure. Pulmonic valve regurgitation is mild. No evidence of pulmonic stenosis. Aorta: The aortic root is normal in size and structure. Venous: The inferior vena cava is normal in size with greater than 50% respiratory variability, suggesting right atrial pressure of 3 mmHg. IAS/Shunts: Cannot exclude a small PFO. Agitated saline contrast was given intravenously to evaluate for intracardiac shunting. Agitated saline contrast bubble study was positive with shunting observed within 3-6 cardiac cycles suggestive of  interatrial shunt.  LEFT VENTRICLE PLAX 2D LVIDd:         4.90 cm      Diastology LVIDs:         3.10 cm      LV e' medial:    16.90 cm/s LV PW:         0.70 cm      LV E/e' medial:  4.8 LV IVS:        0.60 cm      LV e' lateral:   18.50 cm/s LVOT diam:     1.90 cm      LV E/e' lateral: 4.4 LV SV:         30 LV SV Index:   17 LVOT Area:     2.84 cm  LV Volumes (MOD) LV vol d, MOD A2C: 114.0 ml LV vol d, MOD A4C: 102.0 ml LV vol s, MOD A2C: 38.7 ml LV vol s, MOD A4C: 41.7 ml LV SV MOD A2C:     75.3 ml LV SV MOD A4C:     102.0 ml LV SV MOD BP:      67.2 ml RIGHT VENTRICLE            IVC RV S prime:     9.79 cm/s  IVC diam: 1.10 cm TAPSE (M-mode): 2.7 cm LEFT ATRIUM             Index        RIGHT ATRIUM          Index LA diam:        2.00 cm 1.12 cm/m   RA Area:     6.95 cm LA Vol (A2C):   21.3 ml 11.97 ml/m  RA Volume:   11.70 ml 6.58 ml/m LA Vol (A4C):   31.1 ml 17.48 ml/m LA Biplane Vol: 28.1 ml 15.79 ml/m  AORTIC VALVE                    PULMONIC VALVE AV Area (Vmax):    2.03 cm     PV Vmax:          0.96 m/s AV Area  (Vmean):   1.62 cm     PV Peak grad:     3.7 mmHg AV Area (VTI):     1.60 cm     PR End Diast Vel: 3.89 msec AV Vmax:           125.00 cm/s AV Vmean:          87.100 cm/s AV VTI:            0.186 m AV Peak Grad:      6.2 mmHg AV Mean Grad:      4.0 mmHg LVOT Vmax:         89.40 cm/s LVOT Vmean:        49.800 cm/s LVOT VTI:          0.105 m LVOT/AV VTI ratio: 0.56  AORTA Ao Root diam: 2.80 cm Ao Asc diam:  2.40 cm MITRAL VALVE MV Area (PHT): 6.77 cm    SHUNTS MV Area VTI:   2.71 cm    Systemic VTI:  0.10 m MV Peak grad:  3.4 mmHg    Systemic Diam: 1.90 cm MV Mean grad:  2.0 mmHg MV Vmax:       0.92 m/s MV Vmean:      75.1 cm/s MV Decel Time: 112 msec MV E velocity: 81.20 cm/s MV  A velocity: 79.10 cm/s MV E/A ratio:  1.03 Clearnce Hasten Electronically signed by Clearnce Hasten Signature Date/Time: 02/24/2023/12:53:03 PM    Final    DG Abd Portable 1V Result Date: 02/24/2023 CLINICAL DATA:  Feeding tube placement EXAM: PORTABLE ABDOMEN - 1 VIEW COMPARISON:  None Available. FINDINGS: The enteric tube terminates in the stomach, with the distal tip near the expected region of the pylorus. Nonobstructive bowel-gas pattern. Moderate colonic stool burden. The visualized bibasilar lungs clear. No acute osseous abnormality. IMPRESSION: The enteric tube terminates in the distal stomach. Electronically Signed   By: Jacob Moores M.D.   On: 02/24/2023 11:25   DG CHEST PORT 1 VIEW Result Date: 02/23/2023 CLINICAL DATA:  Fever. EXAM: PORTABLE CHEST 1 VIEW COMPARISON:  04/08/2022 FINDINGS: The lungs are clear without focal pneumonia, edema, pneumothorax or pleural effusion. The cardiopericardial silhouette is within normal limits for size. No acute bony abnormality. Telemetry leads overlie the chest. IMPRESSION: No active disease. Electronically Signed   By: Kennith Center M.D.   On: 02/23/2023 14:59   CT HEAD WO CONTRAST ( ) Result Date: 02/23/2023 CLINICAL DATA:  Right history: Stroke, follow-up. EXAM: CT HEAD  WITHOUT CONTRAST TECHNIQUE: Contiguous axial images were obtained from the base of the skull through the vertex without intravenous contrast. RADIATION DOSE REDUCTION: This exam was performed according to the departmental dose-optimization program which includes automated exposure control, adjustment of the mA and/or kV according to patient size and/or use of iterative reconstruction technique. COMPARISON:  Brain MRI 02/22/2023. FINDINGS: Brain: Known fairly extensive acute MCA territory and watershed territory infarcts within the left cerebral hemisphere, not appreciably changed from the prior brain MRI of 02/22/2023. Known small acute right frontoparietal infarcts also again demonstrated. No midline shift or significant mass effect. No evidence of hemorrhagic conversion. No extra-axial fluid collection. No evidence of an intracranial mass. Vascular: Abnormal hyperdensity of a left MCA M2 vessel. Skull: No calvarial fracture or aggressive osseous lesion. Sinuses/Orbits: No orbital mass or acute orbital finding at the imaged levels. No significant paranasal sinus disease at the imaged levels. IMPRESSION: 1. Known acute infarcts within the left greater than right cerebral hemispheres, not appreciably changed in extent from the prior brain MRI of 02/22/2023. No midline shift or significant mass effect. No evidence of hemorrhagic conversion. 2. 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 This is at site of the vascular signal abnormality described on yesterday's MRI. Electronically Signed   By: Jackey Loge D.O.   On: 02/23/2023 13:46   (Echo, Carotid, EGD, Colonoscopy, ERCP)    Subjective: Patient seen and examined.  She had a rough night with multiple interventions.  She was wanting to talk to her mom on the phone.  Speech therapy following.  No other overnight events.   Discharge Exam: Vitals:   03/25/23 0507 03/25/23 0833  BP: 107/71 101/68   Pulse: 93 85  Resp: 18 18  Temp: 99 F (37.2 C) 98.3 F (36.8 C)  SpO2: 97% 100%   Vitals:   03/24/23 1551 03/24/23 2000 03/25/23 0507 03/25/23 0833  BP: 109/70 110/80 107/71 101/68  Pulse: 90 87 93 85  Resp: 16 18 18 18   Temp: 98.6 F (37 C) 98.7 F (37.1 C) 99 F (37.2 C) 98.3 F (36.8 C)  TempSrc: Oral Oral Oral Oral  SpO2: 99% 98% 97% 100%  Weight:      Height:        General: Pt is alert, awake, not in  acute distress Slightly anxious today. Cardiovascular: RRR, S1/S2 +, no rubs, no gallops Respiratory: CTA bilaterally, no wheezing, no rhonchi Abdominal: Soft, NT, ND, bowel sounds +, PEG tube insertion site clean and dry.  Nontender. Extremities: no edema, no cyanosis Patient has significant aphasia, responds yes and no. Dense right hemiplegia.    The results of significant diagnostics from this hospitalization (including imaging, microbiology, ancillary and laboratory) are listed below for reference.     Microbiology: Recent Results (from the past 240 hours)  Culture, blood (Routine X 2) w Reflex to ID Panel     Status: None (Preliminary result)   Collection Time: 03/22/23  6:19 AM   Specimen: BLOOD  Result Value Ref Range Status   Specimen Description BLOOD BLOOD RIGHT HAND  Final   Special Requests   Final    BOTTLES DRAWN AEROBIC AND ANAEROBIC Blood Culture results may not be optimal due to an inadequate volume of blood received in culture bottles   Culture   Final    NO GROWTH 2 DAYS Performed at [facility], [address], [place], [place] [address]    Report Status PENDING  Incomplete  Culture, blood (Routine X 2) w Reflex to ID Panel     Status: None (Preliminary result)   Collection Time: 03/22/23  6:19 AM   Specimen: BLOOD  Result Value Ref Range Status   Specimen Description BLOOD BLOOD LEFT HAND  Final   Special Requests   Final    BOTTLES DRAWN AEROBIC AND ANAEROBIC Blood Culture results may not be optimal due to an inadequate volume  of blood received in culture bottles   Culture   Final    NO GROWTH 2 DAYS Performed at [facility], [address], [place], [place] [address]    Report Status PENDING  Incomplete  Respiratory (~20 pathogens) panel by PCR     Status: None   Collection Time: 03/22/23  7:56 AM   Specimen: Nasopharyngeal Swab; Respiratory  Result Value Ref Range Status   Adenovirus NOT DETECTED NOT DETECTED Final   Coronavirus 229E NOT DETECTED NOT DETECTED Final    Comment: (NOTE) The Coronavirus on the Respiratory Panel, DOES NOT test for the novel  Coronavirus (2019 nCoV)    Coronavirus HKU1 NOT DETECTED NOT DETECTED Final   Coronavirus NL63 NOT DETECTED NOT DETECTED Final   Coronavirus OC43 NOT DETECTED NOT DETECTED Final   Metapneumovirus NOT DETECTED NOT DETECTED Final   Rhinovirus / Enterovirus NOT DETECTED NOT DETECTED Final   Influenza A NOT DETECTED NOT DETECTED Final   Influenza B NOT DETECTED NOT DETECTED Final   Parainfluenza Virus 1 NOT DETECTED NOT DETECTED Final   Parainfluenza Virus 2 NOT DETECTED NOT DETECTED Final   Parainfluenza Virus 3 NOT DETECTED NOT DETECTED Final   Parainfluenza Virus 4 NOT DETECTED NOT DETECTED Final   Respiratory Syncytial Virus NOT DETECTED NOT DETECTED Final   Bordetella pertussis NOT DETECTED NOT DETECTED Final   Bordetella Parapertussis NOT DETECTED NOT DETECTED Final   Chlamydophila pneumoniae NOT DETECTED NOT DETECTED Final   Mycoplasma pneumoniae NOT DETECTED NOT DETECTED Final    Comment: Performed at [facility], [address] [place], [place] [address]     Labs: BNP (last 3 results) No results for input(s): "BNP" in the last 8760 hours. Basic Metabolic Panel: Recent Labs  Lab 03/19/23 0559 03/22/23 0619  NA 134* 133*  K 3.7 3.6  CL 102 104  CO2 23 20*  GLUCOSE 123* 133*  BUN 19 13  CREATININE 0.76 0.85  CALCIUM 9.3 8.9   Liver Function Tests: Recent Labs  Lab 03/19/23 0559  AST 34  ALT 41  ALKPHOS 77   BILITOT 0.5  PROT 7.3  ALBUMIN 3.2*   No results for input(s): "LIPASE", "AMYLASE" in the last 168 hours. No results for input(s): "AMMONIA" in the last 168 hours. CBC: Recent Labs  Lab 03/19/23 0559 03/20/23 1047 03/21/23 0808 03/22/23 0619  WBC 8.0 7.9 9.1 13.6*  HGB 11.9* 11.5* 12.0 11.1*  HCT 35.0* 34.6* 35.8* 31.7*  MCV 78.8* 80.8 79.7* 78.1*  PLT 106* 91* 76* 77*   Cardiac Enzymes: No results for input(s): "CKTOTAL", "CKMB", "CKMBINDEX", "TROPONINI" in the last 168 hours. BNP: Invalid input(s): "POCBNP" CBG: Recent Labs  Lab 03/23/23 0010 03/23/23 0444 03/23/23 1137 03/24/23 0808 03/25/23 0824  GLUCAP 103* 133* 127* 121* 110*   D-Dimer No results for input(s): "DDIMER" in the last 72 hours. Hgb A1c No results for input(s): "HGBA1C" in the last 72 hours. Lipid Profile No results for input(s): "CHOL", "HDL", "LDLCALC", "TRIG", "CHOLHDL", "LDLDIRECT" in the last 72 hours. Thyroid function studies No results for input(s): "TSH", "T4TOTAL", "T3FREE", "THYROIDAB" in the last 72 hours.  Invalid input(s): "FREET3" Anemia work up No results for input(s): "VITAMINB12", "FOLATE", "FERRITIN", "TIBC", "IRON", "RETICCTPCT" in the last 72 hours. Urinalysis    Component Value Date/Time   COLORURINE YELLOW 03/22/2023 0048   APPEARANCEUR CLEAR 03/22/2023 0048   LABSPEC 1.017 03/22/2023 0048   PHURINE 6.0 03/22/2023 0048   GLUCOSEU NEGATIVE 03/22/2023 0048   HGBUR NEGATIVE 03/22/2023 0048   BILIRUBINUR NEGATIVE 03/22/2023 0048   KETONESUR NEGATIVE 03/22/2023 0048   PROTEINUR NEGATIVE 03/22/2023 0048   UROBILINOGEN 1.0 05/08/2021 1944   NITRITE NEGATIVE 03/22/2023 0048   LEUKOCYTESUR NEGATIVE 03/22/2023 0048   Sepsis Labs Recent Labs  Lab 03/19/23 0559 03/20/23 1047 03/21/23 0808 03/22/23 0619  WBC 8.0 7.9 9.1 13.6*   Microbiology Recent Results (from the past 240 hours)  Culture, blood (Routine X 2) w Reflex to ID Panel     Status: None (Preliminary  result)   Collection Time: 03/22/23  6:19 AM   Specimen: BLOOD  Result Value Ref Range Status   Specimen Description BLOOD BLOOD RIGHT HAND  Final   Special Requests   Final    BOTTLES DRAWN AEROBIC AND ANAEROBIC Blood Culture results may not be optimal due to an inadequate volume of blood received in culture bottles   Culture   Final    NO GROWTH 2 DAYS Performed at [facility], [address] [place], [place] [address]    Report Status PENDING  Incomplete  Culture, blood (Routine X 2) w Reflex to ID Panel     Status: None (Preliminary result)   Collection Time: 03/22/23  6:19 AM   Specimen: BLOOD  Result Value Ref Range Status   Specimen Description BLOOD BLOOD LEFT HAND  Final   Special Requests   Final    BOTTLES DRAWN AEROBIC AND ANAEROBIC Blood Culture results may not be optimal due to an inadequate volume of blood received in culture bottles   Culture   Final    NO GROWTH 2 DAYS Performed at [facility], [address], [place], [place] [address]    Report Status PENDING  Incomplete  Respiratory (~20 pathogens) panel by PCR     Status: None   Collection Time: 03/22/23  7:56 AM   Specimen: Nasopharyngeal Swab;  Respiratory  Result Value Ref Range Status   Adenovirus NOT DETECTED NOT DETECTED Final   Coronavirus 229E NOT DETECTED NOT DETECTED Final    Comment: (NOTE) The Coronavirus on the Respiratory Panel, DOES NOT test for the novel  Coronavirus (2019 nCoV)    Coronavirus HKU1 NOT DETECTED NOT DETECTED Final   Coronavirus NL63 NOT DETECTED NOT DETECTED Final   Coronavirus OC43 NOT DETECTED NOT DETECTED Final   Metapneumovirus NOT DETECTED NOT DETECTED Final   Rhinovirus / Enterovirus NOT DETECTED NOT DETECTED Final   Influenza A NOT DETECTED NOT DETECTED Final   Influenza B NOT DETECTED NOT DETECTED Final   Parainfluenza Virus 1 NOT DETECTED NOT DETECTED Final   Parainfluenza Virus 2 NOT DETECTED NOT DETECTED Final   Parainfluenza Virus 3 NOT DETECTED  NOT DETECTED Final   Parainfluenza Virus 4 NOT DETECTED NOT DETECTED Final   Respiratory Syncytial Virus NOT DETECTED NOT DETECTED Final   Bordetella pertussis NOT DETECTED NOT DETECTED Final   Bordetella Parapertussis NOT DETECTED NOT DETECTED Final   Chlamydophila pneumoniae NOT DETECTED NOT DETECTED Final   Mycoplasma pneumoniae NOT DETECTED NOT DETECTED Final    Comment: Performed at [facility], [address], [place], [place] [address]     Time coordinating discharge: 35 minutes  SIGNED:   Dorcas Carrow, MD  [facility] 03/25/2023, 10:30 AM

## 2023-03-25 NOTE — Progress Notes (Signed)
Patient to discharge to CIR, [phone]  Report called Cora LPN on 4W at this time.

## 2023-03-25 NOTE — Progress Notes (Signed)
Pts and family keep calling staffing every 15 or less minutes for minor stuff and changing patient pure wick and pad anytime she voids, I educated them that ,it is better to switch to bedpan since patients can lift herself up with no help and we can't switch the pure wick every 15 minutes anytime she voids since it is limited and our floor is running out of chucks or pads too. Pure Jacqueline [name] is change every 8 hours unless it is soiled or malfunctioned, between 7 pm and 12 midnight, staff used about 4 pure wicks and 8 chucks or bed pads on this patient alone.and this has been a routine for weeks now, any staff member that has taken care of this patient can testify this.  Also family member comes in and out of patient's room all night and that make our care for the patient even worse as she doesn't sleep at night enough, they come in groups back and forth all night. One family member cursed me out , call me nasty as I tried explaining our concern for the patient health and our effective care., assigned nurse Darcel Bayley RN was with me in the patient room during communication. We will continue to monitor.

## 2023-03-25 NOTE — Progress Notes (Signed)
Inpatient Rehabilitation Admissions Coordinator   I contacted patient's Mom by phone to make her aware that CIR bed is available to admit her to today. She is in agreement. I made her aware that CIR will allow one visitor overnight, but they have to be in her room by 8 pm and can not go in and out of her room. Priority is for patient to get rest at night, to be at her full potential to participate in therapies during the day. I also spoke with [name] by phone, friend, to make him aware of the same. Mom states she will discuss with her family and patient's friends to be aware of visiting hours, goals of patient to be able to participate daily with therapy and to encourage sleep at night. Acute team and TOC made aware. I will make the arrangements to admit today.  Ottie Glazier, RN, MSN Rehab Admissions Coordinator [phone] 03/25/2023 10:32 AM

## 2023-03-25 NOTE — Progress Notes (Signed)
Jacqueline Rouge, MD  Physician Physical Medicine and Rehabilitation   PMR Pre-admission    Signed   Date of Service: 03/25/2023 10:35 AM  Related encounter: ED to Hosp-Admission (Discharged) from 02/21/2023 in [facility]   Signed     Expand All Collapse All  Show:Clear all [x] Written[x] Templated[x] Copied  Added by: [x] Beckie Salts Tye [place], RN[x] Trish Mage, RN  [] Hover for details PMR Admission Coordinator Pre-Admission Assessment   Patient: Jacqueline [name] is an 23 y.o., female MRN: [id number] DOB: 2001/01/13 Height: 5\' 4"  (162.6 cm) Weight: 76.5 kg                                                                                                                                                  Insurance Information HMO:     PPO:      PCP:      IPA:      80/20:      OTHER: [facility] PRIMARY: [place] [facility]      Policy#: [id number]      Subscriber: patient CM Name: Cacilie      Phone#: [phone]     Fax#: [phone] Pre-Cert#: [id number] approved for 6 days 1/21 until 1/26. Updates due 1/27      Employer: Part time Benefits:  Phone #: [phone]     Name: 1/17 Eff. Date: 09/02/22 until 04/04/23; I spoke with her Mom on 1/17 to inform her of end date 04/04/23 and she states she will go to DSS to make sure to update for renewal     Deduct: none      Out of Pocket Max: none      Life Max: none  CIR: per medicaid      SNF: per medicaid 90 days covered then moved to [facility] Outpatient: 100 % per medicaid for 27 visits     Co-Pay:  Home Health: 100%      Co-Pay:  100 visits per year DME: 100%     Co-Pay: limited by medical neccesity Providers: in network  SECONDARY:       Policy#:       Phone#:    Artist:       Phone#:    The Engineer, materials Information Summary" for patients in Inpatient Rehabilitation Facilities with attached "Privacy Act Statement-Health Care Records" was provided and verbally reviewed with: family   Emergency Contact  Information Contact Information       Name Relation Home Work [place] Mother     [phone]    Edward,Dre Father     [phone]         Other Contacts       Name Relation Home Work Mobile    Rutledge,[name]       [phone]         Current Medical History  Patient Admitting Diagnosis: L MCA stroke   History of Present Illness: 23 year old right-handed female with medical history significant for Kell's isoimmunization pregnancy, unprovoked PE with positive lupus anticoagulant for which she stopped Eliquis in June 2024, hemoglobin C trait, anemia iron deficient thrombocytopenia, tobacco use.  Presented 02/21/2023 with expressive aphasia and right side weakness.  Per chart patient admitted to drinking a significant amount of alcohol the night prior and did take Suboxone that has not been prescribed to her.     Chemistries unremarkable except hemoglobin 8.4, alcohol of 14, urine drug screen negative.  Cranial CT scan showed mild loss of gray-Sensabaugh differentiation left parietal temporal region representing left MCA territory acute infarct versus artifact.  No acute hemorrhage.  CTA showed occluded left proximal to mid M2 MCA.  MRI follow-up showed fairly extensive acute left MCA territory and watershed territory infarcts within the left cerebral hemisphere.  Small acute right frontal parietal cortical/subcortical infarcts also present.  Underwent left common carotid arteriogram per interventional radiology with attempted mechanical thrombectomy which unfortunately was unsuccessful Dr.Deveshwar.  Echocardiogram ejection fraction of 60 to 65% no wall motion abnormalities.  Hospital course bouts of agitation requiring restraints as well as Precedex.  EEG negative for seizure but did show diffuse encephalopathy.  A repeat MRI was completed for stroke follow-up 02/27/2023 showing increased acute infarcts in left posterior frontal and parietal lobes, anterior left frontal lobe and  right frontal lobe, and additional punctate acute infarcts in left occipital, left frontal lobe and medial parietal lobe.  No restricted diffusion in the left midbrain, cerebral peduncle, thalamus and posterior limb of internal capsule.  Mild increased mass effect from the larger areas of the left posterior frontal, left temporal and left parietal lobe with known more than 3 mm of left-to-right midline shift.  Neurology continues to follow patient is currently maintained on Eliquis.  Gastrostomy tube was placed 03/21/2023 for dysphagia per interventional radiology Dr. Loreta Ave.  Her diet has since been advanced to a regular consistency.  Anemia iron deficient/thrombocytopenia.  She was last seen by hematology 2/24 for thrombocytopenia with no explanation for thrombocytopenia proposed latest platelet count 77,000 and continue to follow.  Hospital course patient did spike low-grade fever E. coli UTI placed on Rocephin x 5 days completed.    Complete NIHSS TOTAL: 22 Glasgow Coma Scale Score: 15   Patient's medical record from [facility] has been reviewed by the rehabilitation admission coordinator and physician.   Past Medical History      Past Medical History:  Diagnosis Date   Bacterial vaginitis 07/10/2016   Candida vaginitis 07/24/2016   Headache in pregnancy, antepartum, third trimester 07/24/2016   Kell isoimmunization during pregnancy          Has the patient had major surgery during 100 days prior to admission? yes   Family History  family history includes Diabetes in her maternal grandmother; Hypertension in her maternal grandmother.   Current Medications   Current Medications    Current Facility-Administered Medications:    acetaminophen (TYLENOL) tablet 650 mg, 650 mg, Oral, Q4H PRN, 650 mg at 03/24/23 1008 **OR** acetaminophen (TYLENOL) 160 MG/5ML solution 650 mg, 650 mg, Per Tube, Q4H PRN, 650 mg at 03/24/23 2330 **OR** acetaminophen (TYLENOL) suppository 650 mg, 650 mg,  Rectal, Q4H PRN, Deveshwar, Sanjeev, MD, 650 mg at 02/28/23 1009   apixaban (ELIQUIS) tablet 5 mg, 5 mg, Oral, BID, Marolyn Haller, MD, 5 mg at 03/25/23 1012   clonazePAM (KLONOPIN) tablet 0.5 mg, 0.5 mg, Per  Tube, QHS, Danford, Earl Lites, MD, 0.5 mg at 03/24/23 2054   cyanocobalamin (VITAMIN B12) tablet 2,000 mcg, 2,000 mcg, Per Tube, Daily, Agarwala, Ravi, MD, 2,000 mcg at 03/25/23 1011   feeding supplement (OSMOLITE 1.5 CAL) liquid 355 mL, 355 mL, Per Tube, QID, Dorcas Carrow, MD, 355 mL at 03/25/23 0819   feeding supplement (PROSource TF20) liquid 60 mL, 60 mL, Per Tube, Daily, Ghimire, Lyndel Safe, MD, 60 mL at 03/25/23 1012   folic acid (FOLVITE) tablet 1 mg, 1 mg, Per Tube, Daily, Agarwala, Ravi, MD, 1 mg at 03/25/23 1011   free water 150 mL, 150 mL, Per Tube, QID, Dorcas Carrow, MD, 150 mL at 03/25/23 0820   gabapentin (NEURONTIN) capsule 100 mg, 100 mg, Oral, TID, Danford, Earl Lites, MD, 100 mg at 03/25/23 1011   iron polysaccharides (NIFEREX) capsule 150 mg, 150 mg, Per Tube, Daily, Agarwala, Ravi, MD, 150 mg at 03/25/23 1012   melatonin tablet 5 mg, 5 mg, Oral, QHS PRN, Dow Adolph N, DO, 5 mg at 03/24/23 0008   multivitamin liquid 15 mL, 15 mL, Per Tube, Daily, Marvel Plan, MD, 15 mL at 03/25/23 1012   neomycin-bacitracin-polymyxin [phone] OINT 1 Application, 1 Application, Topical, Daily, Gilmer Mor, DO, 1 Application at 03/25/23 1012   ondansetron (ZOFRAN) injection 4 mg, 4 mg, Intravenous, Q6H PRN, Milon Dikes, MD, 4 mg at 03/15/23 1032   oxyCODONE (ROXICODONE) 5 MG/5ML solution 5 mg, 5 mg, Oral, Q6H PRN, Danford, Earl Lites, MD, 5 mg at 03/25/23 1610   polyethylene glycol (MIRALAX / GLYCOLAX) packet 17 g, 17 g, Per Tube, Daily, Marvel Plan, MD, 17 g at 03/25/23 1012   topiramate (TOPAMAX) tablet 25 mg, 25 mg, Oral, BID, Danford, Earl Lites, MD, 25 mg at 03/25/23 1011     Patients Current Diet:  Diet Order                  Diet general             Diet  regular Room service appropriate? No; Fluid consistency: Thin  Diet effective now                       Precautions / Restrictions Precautions Precautions: Fall Precaution Comments: peg Restrictions Weight Bearing Restrictions Per Provider Order: No    Has the patient had 2 or more falls or a fall with injury in the past year?No   Prior Activity Level Limited Community (1-2x/wk): went out 3-4 times a week   Prior Functional Level Prior Function Prior Level of Function : Independent/Modified Independent, Driving Mobility Comments: independent ADLs Comments: independent   Self Care: Did the patient need help bathing, dressing, using the toilet or eating?  Independent   Indoor Mobility: Did the patient need assistance with walking from room to room (with or without device)? Independent   Stairs: Did the patient need assistance with internal or external stairs (with or without device)? Independent   Functional Cognition: Did the patient need help planning regular tasks such as shopping or remembering to take medications? Independent   Patient Information Are you of Hispanic, Latino/a,or Spanish origin?: X. Patient unable to respond (per mom) What is your race?: X. Patient unable to respond, B. Black or African American (per Mom) Do you need or want an interpreter to communicate with a doctor or health care staff?: 9. Unable to respond (not needed per Mom)   Patient's Response To:  Health Literacy and Transportation Is the patient able  to respond to health literacy and transportation needs?: No Health Literacy - How often do you need to have someone help you when you read instructions, pamphlets, or other written material from your doctor or pharmacy?: Patient unable to respond In the past 12 months, has lack of transportation kept you from medical appointments or from getting medications?: No (per Mom) In the past 12 months, has lack of transportation kept you from meetings,  work, or from getting things needed for daily living?:  (per Mom)   Home Assistive Devices / Equipment Home Equipment: Grab bars - tub/shower   Prior Device Use: Indicate devices/aids used by the patient prior to current illness, exacerbation or injury? None of the above   Current Functional Level Cognition   Arousal/Alertness: Awake/alert Overall Cognitive Status: Difficult to assess Difficult to assess due to: Impaired communication Current Attention Level: Sustained Orientation Level: Oriented to person, Oriented to place Following Commands: Follows one step commands with increased time Safety/Judgement: Decreased awareness of safety, Decreased awareness of deficits General Comments: Vast improvement in attention, initiation, cognition, arousal, and command following. fairly consistent yes/no to most questions. visible frustration with not being able to verbally state needs Attention: Sustained Sustained Attention:  (sustained attention to PO trials well initially) Behaviors: Other (comment) (yelling, not agitated with SLP but has been agitated with other staff)    Extremity Assessment (includes Sensation/Coordination)   Upper Extremity Assessment: RUE deficits/detail RUE Deficits / Details: no AROM noted, RUE resting in protective internal rotation with flexion. unable to PROM digits or elbow, pt reporting pain and asking therapist to stop. Soft elbow splint not donned, pt will benefit from resting hadn splint RUE Sensation: decreased light touch, decreased proprioception RUE Coordination: decreased fine motor, decreased gross motor LUE Deficits / Details: Using functionally and to command this date. Attempted hand writing task with non-dominant. LUE Coordination: decreased fine motor, decreased gross motor  Lower Extremity Assessment: Defer to PT evaluation RLE Deficits / Details: no active movement, withdrawal from pain, or clonus. ROM WFL     ADLs   Overall ADL's : Needs  assistance/impaired Eating/Feeding: NPO Grooming: Wash/dry face, Total assistance Grooming Details (indicate cue type and reason): total A to wash face Upper Body Dressing : Maximal assistance Upper Body Dressing Details (indicate cue type and reason): sitting EOB, educated to don over RUE first Lower Body Dressing: Total assistance, Bed level Lower Body Dressing Details (indicate cue type and reason): to don socks from bed level Toilet Transfer: Moderate assistance, Tax adviser Details (indicate cue type and reason): simulated with +2 at EOB Functional mobility during ADLs: Moderate assistance General ADL Comments: contines to be limited by R hemi deficits and communication although continues to make great strides towards her acute goals     Mobility   Overal bed mobility: Needs Assistance Bed Mobility: Rolling, Sidelying to Sit, Sit to Supine Rolling: Mod assist Sidelying to sit: Mod assist Supine to sit: Max assist Sit to supine: Mod assist General bed mobility comments: towards R, attempting to have pt hook LLE under R to self mobilize to EOB     Transfers   Overall transfer level: Needs assistance Equipment used: Rolling walker (2 wheels), 1 person hand held assist Transfers: Sit to/from Stand Sit to Stand: Mod assist, Max assist Bed to/from chair/wheelchair/BSC transfer type:: Stand pivot Stand pivot transfers: Max assist, +2 physical assistance  Lateral/Scoot Transfers: +2 physical assistance, Max assist General transfer comment: Pt following commands for safety and progressed to max A to  stand from EOB with RW support modA to stand with HHA, cues for weight shif to L able to take x1 step laterally hands on assist at RLE     Ambulation / Gait / Stairs / Wheelchair Mobility   Ambulation/Gait General Gait Details: NT     Posture / Balance Dynamic Sitting Balance Sitting balance - Comments: intermittent R lateral lean, VC to correct Balance Overall balance  assessment: Needs assistance Sitting-balance support: Feet supported, Single extremity supported Sitting balance-Leahy Scale: Fair Sitting balance - Comments: intermittent R lateral lean, VC to correct Postural control: Right lateral lean, Posterior lean Standing balance support: Bilateral upper extremity supported Standing balance-Leahy Scale: Poor Standing balance comment: mod-max to maintain standing and shift weight to L     Special needs/care consideration PEG placement 1/17      Previous Home Environment  Living Arrangements: Spouse/significant other (BD and 2 minor children) Available Help at Discharge: Family, Available 24 hours/day (boyfriend, mom, or sister) Type of Home: Apartment Home Layout: One level Home Access: Stairs to enter Entrance Stairs-Rails: None (walls) Entrance Stairs-Number of Steps: 1 flight Bathroom Shower/Tub: Engineer, manufacturing systems: Standard Home Care Services: No   Children are 5 and 6 and her Mom is caring for them. Children's Dad is "[place]". [name] is a "friend"/boyfriend. Has 9 siblings. Mom plans for patient to come to her house, and family to care for her.   I spoke with Mom on 1/21 to make her aware that patient could have 1 visitor overnight, must be in the room by 8 pm and to stay in her room, not in the halls. Goals is for patient to sleep at night to be able to participate with therapies during the day. Mom to discuss with family and friends.   Discharge Living Setting Plans for Discharge Living Setting: Apartment, Lives with (comment) (Plans to live with mom) Type of Home at Discharge: Apartment Discharge Home Layout: One level Discharge Home Access: Stairs to enter Entrance Stairs-Rails: Right Entrance Stairs-Number of Steps: 10 or more steps to 2nd level apartment Discharge Bathroom Shower/Tub: Tub/shower unit, Curtain Discharge Bathroom Toilet: Standard Discharge Bathroom Accessibility: Yes How Accessible: Accessible via  walker Does the patient have any problems obtaining your medications?: No   Social/Family/Support Systems Patient Roles: Other (Comment), Parent (Has mom, SO, BF and 2 small children) Contact Information: Anayjah Zakrzewski - mom - [phone] Anticipated Caregiver: Mom, BF, sister and has 10 siblings Ability/Limitations of Caregiver: Mom can assist and has 10 children who can also help Caregiver Availability: 24/7 Discharge Plan Discussed with Primary Caregiver: Yes Is Caregiver In Agreement with Plan?: Yes Does Caregiver/Family have Issues with Lodging/Transportation while Pt is in Rehab?: No   Goals Patient/Family Goal for Rehab: PT/OT min to mod Assist, SLP mod to max assist goals Expected length of stay: 3-4 weeks Additional Information: Mom to discuss home with BF and other family members; WHat is best for Jessel Pt/Family Agrees to Admission and willing to participate: Yes Program Orientation Provided & Reviewed with Pt/Caregiver Including Roles  & Responsibilities: Yes Additional Information Needs: no   Decrease burden of Care through IP rehab admission: N/A   Possible need for SNF placement upon discharge:  Not planned   Patient Condition: This patient's medical and functional status has changed since the consult dated: 03/04/23 in which the Rehabilitation Physician determined and documented that the patient's condition is appropriate for intensive rehabilitative care in an inpatient rehabilitation facility. See "History of Present Illness" (above) for medical update.  Functional changes are: max assist overall. Patient's medical and functional status update has been discussed with the Rehabilitation physician and patient remains appropriate for inpatient rehabilitation. Will admit to inpatient rehab today.   Preadmission Screen Completed By:  Clois Dupes, RN MSN, 03/25/2023 11:06 AM ______________________________________________________________________   Discussed  status with Dr. Berline Chough on 03/25/23 at 1043 and received approval for admission today.   Admission Coordinator:  Clois Dupes, RN MSN time 1043 Date 03/25/23            Revision History

## 2023-03-25 NOTE — TOC Transition Note (Signed)
Transition of Care [facility]) - Discharge Note   Patient Details  Name: Jacqueline [name] MRN: [id number] Date of Birth: May 27, 2000  [facility]) CM/SW Contact:  Kermit Balo, RN Phone Number: 03/25/2023, 10:39 AM   Clinical Narrative:     Pt is discharging to CIR today. TOC signing off.   Final next level of care: IP Rehab Facility Barriers to Discharge: No Barriers Identified   Patient Goals and CMS Choice   CMS Medicare.gov Compare Post Acute Care list provided to:: Patient Choice offered to / list presented to : Patient, Parent      Discharge Placement                       Discharge Plan and Services Additional resources added to the After Visit Summary for                                       Social Drivers of Health (SDOH) Interventions SDOH Screenings   Food Insecurity: No Food Insecurity (03/07/2023)  Housing: Patient Unable To Answer (02/21/2023)  Transportation Needs: Patient Unable To Answer (02/21/2023)  Utilities: Patient Unable To Answer (02/21/2023)  Financial Resource Strain: Not on File (06/21/2021)   Received from [place], [place]  Physical Activity: Not on File (06/21/2021)   Received from [place], [place]  Social Connections: Not on File (11/16/2022)   Received from [facility]  Stress: Not on File (06/21/2021)   Received from [place], [place]  Tobacco Use: High Risk (02/21/2023)     Readmission Risk Interventions     No data to display

## 2023-03-25 NOTE — Progress Notes (Signed)
Orthopedic Tech Progress Note Patient Details:  Jacqueline [name] October 03, 2000 [id number]  Patient ID: Jacqueline [name], female   DOB: May 23, 2000, 23 y.o.   MRN: [id number] Order placed with [facility] for rehab combo. Darleen Crocker 03/25/2023, 8:21 PM

## 2023-03-25 NOTE — Progress Notes (Signed)
Speech Language Pathology Treatment: Cognitive-Linquistic  Patient Details Name: LILLYANAH GINGER MRN: [id number] DOB: 12/18/2000 Today's Date: 03/25/2023 Time: 0865-7846 SLP Time Calculation (min) (ACUTE ONLY): 13 min  Assessment / Plan / Recommendation Clinical Impression  Pt alert and making improvements in language and communication. She is able to respond Yes/No to wants and needs questions adequately. She pointed to communication board at a phone to request a phone, but then became frustrated when therapist did not immediately know where her own phone was. She used her phone to call her family and answered their questions with Y/N. She did not want to work on repetition of functional phrases or automatic language and said "no." SLP was able to get one attempt from pt to extend phrase and repeat "I don't want to" which was distorted but similar to target. Pt refused meal at bedside but NT reports that she will eat food from home. Session generally tailored around pt expressing wants and needs and shaping this into therapeutic activity, but pt is resistant to structured tasks or therapist directed activity. SLP reported to family on the phone to encourage her. She doesn't seem to comprehend the purpose of working on language targets. Would be best to cotreat in the future and shape language during mobility and ADLs   HPI HPI: The pt is a 23 yo female presenting 12/20 with expressive aphasia. 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 MRI showed fairly extensive acute left MCA territory and watershed territory infarcts within the left cerebral hemisphere as well as small acute R frontoparietal cortical/subcortical infarcts. Pt initially passed the Yale swallow screen but become more aphasic and started to cough with PO intake. 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      SLP Plan   Continue with current plan of care      Recommendations for follow up therapy are one component of a multi-disciplinary discharge planning process, led by the attending physician.  Recommendations may be updated based on patient status, additional functional criteria and insurance authorization.    Recommendations                   Rehab consult Patient independent with oral care   Frequent or constant Supervision/Assistance       Continue with current plan of care     Danese Dorsainvil, [name]  03/25/2023, 10:00 AM
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
## 2023-03-25 NOTE — Progress Notes (Signed)
Orthopedic Tech Progress Note Patient Details:  LIZETT RYMER Aug 16, 2000 [id number]  Called in order to [employer] for a RESTING HAND SPLINT   Patient ID: MEILI RIGGAN, female   DOB: [birth year], 23 y.o.   MRN: [id number]  Donald Pore 03/25/2023, 8:55 AM

## 2023-03-25 NOTE — H&P (Signed)
Physical Medicine and Rehabilitation Admission H&P        Chief Complaint  Patient presents with   Code Stroke  : HPI: Jacqueline [name] is a 23 year old right-handed female with medical history significant for Kells isoimmunization pregnancy, unprovoked PE with positive lupus anticoagulant for which she stopped Eliquis in June 2024, hemoglobin C trait, anemia iron deficient thrombocytopenia, tobacco use.  Per chart review patient lives with her 2 children ages 50 and 21.  She has a boyfriend mother and sister with good support.  1 level home with 1 flight of stairs to entry.  Reportedly independent prior to admission.  Presented 02/21/2023 with expressive aphasia and right side weakness.  Per chart patient admitted to drinking a significant amount of alcohol the night prior and did take Suboxone that has not been prescribed to her.  Chemistries unremarkable except hemoglobin 8.4, alcohol of 14, urine drug screen negative.  Cranial CT scan showed mild loss of gray-Mizrachi differentiation left parietal temporal region representing left MCA territory acute infarct versus artifact.  No acute hemorrhage.  CTA showed occluded left proximal to mid M2 MCA.  MRI follow-up showed fairly extensive acute left MCA territory and watershed territory infarcts within the left cerebral hemisphere.  Small acute right frontal parietal cortical/subcortical infarcts also present.  Underwent left common carotid arteriogram per interventional radiology with attempted mechanical thrombectomy which unfortunately was unsuccessful Dr.Deveshwar.  Echocardiogram ejection fraction of 60 to 65% no wall motion abnormalities.  Hospital course bouts of agitation requiring restraints as well as Precedex.  EEG negative for seizure but did show diffuse encephalopathy.  A repeat MRI was completed for stroke follow-up 02/27/2023 showing increased acute infarcts in left posterior frontal and parietal lobes, anterior left frontal lobe and right  frontal lobe, and additional punctate acute infarcts in left occipital, left frontal lobe and medial parietal lobe.  No restricted diffusion in the left midbrain, cerebral peduncle, thalamus and posterior limb of internal capsule.  Mild increased mass effect from the larger areas of the left posterior frontal, left temporal and left parietal lobe with known more than 3 mm of left-to-right midline shift.  Neurology continues to follow patient is currently maintained on Eliquis.  Gastrostomy tube was placed 03/21/2023 for dysphagia per interventional radiology Dr. Loreta Ave.  Her diet has since been advanced to a regular consistency.  Anemia iron deficient/thrombocytopenia.  She was last seen by hematology 2/24 for thrombocytopenia with no explanation for thrombocytopenia proposed latest platelet count 77,000 and continue to follow.  Hospital course patient did spike low-grade fever E. coli UTI placed on Rocephin x 5 days completed.  Therapy evaluations completed due to patient decreased functional mobility was admitted for a comprehensive rehab program.     Pt herself not really able to ask or answer questions about bowels- does nod head yes to abd pain from PEG; and very upset that is wet- hit nursing button at least 3 times while I was in room for same issue.    Still getting TF's due to poor appetite per BF.    Review of Systems  Constitutional:  Negative for chills and fever.  HENT:  Negative for hearing loss.   Eyes:  Negative for blurred vision and double vision.  Respiratory:  Negative for cough, shortness of breath and wheezing.   Cardiovascular:  Negative for chest pain, palpitations and leg swelling.  Gastrointestinal:  Positive for constipation. Negative for heartburn, nausea and vomiting.  Genitourinary:  Negative for dysuria, flank pain  and hematuria.  Musculoskeletal:  Positive for myalgias.  Skin:  Negative for rash.  Neurological:  Positive for sensory change, speech change, weakness and  headaches.  All other systems reviewed and are negative.       Past Medical History:  Diagnosis Date   Bacterial vaginitis 07/10/2016   Candida vaginitis 07/24/2016   Headache in pregnancy, antepartum, third trimester 07/24/2016   Kell isoimmunization during pregnancy               Past Surgical History:  Procedure Laterality Date   CESAREAN SECTION N/A 09/04/2016    Procedure: CESAREAN SECTION;  Surgeon: Levie Heritage, DO;  Location: [facility];  Service: Obstetrics;  Laterality: N/A;   CESAREAN SECTION Bilateral     IR CT HEAD LTD   02/21/2023   IR GASTROSTOMY TUBE MOD SED   03/21/2023   IR PERCUTANEOUS ART THROMBECTOMY/INFUSION INTRACRANIAL INC DIAG ANGIO   02/21/2023   RADIOLOGY WITH ANESTHESIA N/A 02/21/2023    Procedure: IR WITH ANESTHESIA;  Surgeon: Julieanne Cotton, MD;  Location: [facility];  Service: Radiology;  Laterality: N/A;             Family History  Problem Relation Age of Onset   Hypertension Maternal Grandmother     Diabetes Maternal Grandmother     Cancer Neg Hx          Social History:  reports that she has been smoking cigars. She has never used smokeless tobacco. She reports current alcohol use. She reports that she does not use drugs. Allergies:  Allergies  No Known Allergies         Medications Prior to Admission  Medication Sig Dispense Refill   acetaminophen (TYLENOL) 500 MG tablet Take 500 mg by mouth every 6 (six) hours as needed for moderate pain (pain score 4-6).       ibuprofen (ADVIL) 200 MG tablet Take 200-600 mg by mouth every 6 (six) hours as needed for moderate pain (pain score 4-6).       apixaban (ELIQUIS) 5 MG TABS tablet Take apixaban 10 mg (2 tabs) twice daily until [facility] Feb 14 then reduce to apixaban 5 mg (1 tab) twice daily (Patient not taking: Reported on 02/22/2023) 66 tablet 0              Home: Home Living Family/patient expects to be discharged to:: Private residence Living Arrangements: Spouse/significant other  (BD and 2 minor children) Available Help at Discharge: Family, Available 24 hours/day (boyfriend, mom, or sister) Type of Home: Apartment Home Access: Stairs to enter Secretary/administrator of Steps: 1 flight Entrance Stairs-Rails: None (walls) Home Layout: One level Bathroom Shower/Tub: Engineer, manufacturing systems: Standard Home Equipment: Grab bars - tub/shower   Functional History: Prior Function Prior Level of Function : Independent/Modified Independent, Driving Mobility Comments: independent ADLs Comments: independent   Functional Status:  Mobility: Bed Mobility Overal bed mobility: Needs Assistance Bed Mobility: Rolling, Sidelying to Sit, Sit to Supine Rolling: Mod assist Sidelying to sit: Mod assist Supine to sit: Max assist Sit to supine: Mod assist General bed mobility comments: towards R, attempting to have pt hook LLE under R to self mobilize to EOB Transfers Overall transfer level: Needs assistance Equipment used: Rolling walker (2 wheels), 1 person hand held assist Transfers: Sit to/from Stand Sit to Stand: Mod assist, Max assist Bed to/from chair/wheelchair/BSC transfer type:: Stand pivot Stand pivot transfers: Max assist, +2 physical assistance  Lateral/Scoot Transfers: +2 physical assistance, Max assist General transfer  comment: Pt following commands for safety and progressed to max A to stand from EOB with RW support modA to stand with HHA, cues for weight shif to L able to take x1 step laterally hands on assist at RLE Ambulation/Gait General Gait Details: NT   ADL: ADL Overall ADL's : Needs assistance/impaired Eating/Feeding: NPO Grooming: Wash/dry face, Total assistance Grooming Details (indicate cue type and reason): total A to wash face Upper Body Dressing : Maximal assistance Upper Body Dressing Details (indicate cue type and reason): sitting EOB, educated to don over RUE first Lower Body Dressing: Total assistance, Bed level Lower Body  Dressing Details (indicate cue type and reason): to don socks from bed level Toilet Transfer: Moderate assistance, Tax adviser Details (indicate cue type and reason): simulated with +2 at EOB Functional mobility during ADLs: Moderate assistance General ADL Comments: contines to be limited by R hemi deficits and communication although continues to make great strides towards her acute goals   Cognition: Cognition Overall Cognitive Status: Difficult to assess Arousal/Alertness: Awake/alert Orientation Level: Oriented to person, Oriented to place Attention: Sustained Sustained Attention:  (sustained attention to PO trials well initially) Behaviors: Other (comment) (yelling, not agitated with SLP but has been agitated with other staff) Cognition Arousal: Alert Behavior During Therapy: WFL for tasks assessed/performed Overall Cognitive Status: Difficult to assess Area of Impairment: Following commands, Safety/judgement, Problem solving, Awareness Current Attention Level: Sustained Following Commands: Follows one step commands with increased time Safety/Judgement: Decreased awareness of safety, Decreased awareness of deficits Awareness: Intellectual Problem Solving: Slow processing, Decreased initiation, Requires verbal cues, Difficulty sequencing General Comments: Vast improvement in attention, initiation, cognition, arousal, and command following. fairly consistent yes/no to most questions. visible frustration with not being able to verbally state needs Difficult to assess due to: Impaired communication   Physical Exam: Blood pressure 107/71, pulse 93, temperature 99 F (37.2 C), temperature source Oral, resp. rate 18, height 5\' 4"  (1.626 m), weight 76.5 kg, SpO2 97%, unknown if currently breastfeeding. Physical Exam Vitals and nursing note reviewed. Exam conducted with a chaperone present.  Constitutional:      Appearance: Normal appearance.     Comments: Somewhat awake  after woke her from nap; BF at bedside;  started yelling over and over again- "really...really.." and very upset; but no acute distress  HENT:     Head: Normocephalic.     Comments: R facial droop Wouldn't couldn't stick out tongue      Right Ear: External ear normal.     Left Ear: External ear normal.     Nose: Nose normal. No congestion.     Mouth/Throat:     Mouth: Mucous membranes are dry.     Pharynx: Oropharynx is clear. No oropharyngeal exudate.  Eyes:     Comments: EOM;s L gaze preference- but appeared to be able to cross midline  Cardiovascular:     Rate and Rhythm: Normal rate and regular rhythm.     Heart sounds: Normal heart sounds. No murmur heard.    No gallop.  Pulmonary:     Effort: Pulmonary effort is normal. No respiratory distress.     Breath sounds: Normal breath sounds. No wheezing, rhonchi or rales.  Abdominal:     General: Bowel sounds are normal. There is no distension.     Palpations: Abdomen is soft.     Tenderness: There is abdominal tenderness.     Comments: Urostomy tube in place PEG in place- appears uncomfortable  Musculoskeletal:  Cervical back: Normal range of motion and neck supple.     Comments: LUE/LLE moving well again gravity- couldn't complete testing due to perseveration RUE- no voluntary movement seen of RUE or RLE even to tactile stimuli  Skin:    General: Skin is warm and dry.     Comments: Odor of yeast in R hand   Neurological:     Comments: Patient is alert.  She does make eye contact with examiner.  Patient is aphasic but does have some spontaneous simple word responses. Pt initially sleepy/dozing off, but then got very loud, upset it appeared because she was wet- perseverative/couldn't stop repeating cycle of 1 thought MAS of 3-4 in R shoulder esp External rotation; MAS of 4 in R elbow- lacking 45 degrees of extension with a lot of work; MAS of 2-3 of R hand- in fist at rest, but could completely open fingers and get past wrist  neutral.  Cannot assess if sensation is intact- I don't think it is - didn't even respond to tickling of R foot (BF said ticklish).   Psychiatric:     Comments: Very loud, yelling        Lab Results Last 48 Hours        Results for orders placed or performed during the hospital encounter of 02/21/23 (from the past 48 hours)  Glucose, capillary     Status: Abnormal    Collection Time: 03/23/23 11:37 AM  Result Value Ref Range    Glucose-Capillary 127 (H) 70 - 99 mg/dL      Comment: Glucose reference range applies only to samples taken after fasting for at least 8 hours.  Glucose, capillary     Status: Abnormal    Collection Time: 03/24/23  8:08 AM  Result Value Ref Range    Glucose-Capillary 121 (H) 70 - 99 mg/dL      Comment: Glucose reference range applies only to samples taken after fasting for at least 8 hours.      Imaging Results (Last 48 hours)  No results found.         Blood pressure 107/71, pulse 93, temperature 99 F (37.2 C), temperature source Oral, resp. rate 18, height 5\' 4"  (1.626 m), weight 76.5 kg, SpO2 97%, unknown if currently breastfeeding.   Medical Problem List and Plan: 1. 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             -patient may  shower- cover PEG             -ELOS/Goals: 3-4 weeks mod to max A             Admit to CIR  2.  Antithrombotics: -DVT/anticoagulation:  Pharmaceutical: Eliquis             -antiplatelet therapy: N/A 3. Pain Management: Neurontin 100 mg 3 times daily, Topamax 25 mg twice daily, oxycodone 5 mg every 6 hours as needed 4. Mood/Behavior/Sleep: Klonopin 0.5 mg nightly, melatonin 5 mg nightly as needed             -antipsychotic agents: suggest Depakote or increase in gabapentin to help calm pt down;  5. Neuropsych/cognition: This patient is not capable of making decisions on her own behalf. 6. Skin/Wound Care: routine skin care for PEG and  overall skin 7. Fluids/Electrolytes/Nutrition: will assess labs and recheck labs in AM 8.  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  Followed by oncology/hematology  9.  Anemia/thrombocytopenia/iron deficiency.  Follow-up CBC.  Continue Niferex 10.  Dysphagia.  Status post gastrostomy tube 03/21/2023 per interventional radiology.  Diet has been advanced to regular.  Dietary follow-up- still on TF's because not eating well.  11.  E. coli UTI.  Antibiotic therapy completed 12.  Tobacco/alcohol use.  Counseling 13. Severe RUE spasticity- would strongly suggest Botox of RUE soon as well as resting hand splint for R wrist and R PRAFO- will order.  14. Abd pain- from PEG vs constipation- if no BM, might need KUB.      Mcarthur Rossetti Angiulli, PA-C 03/25/2023     I have personally performed a face to face diagnostic evaluation of this patient and formulated the key components of the plan.  Additionally, I have personally reviewed laboratory data, imaging studies, as well as relevant notes and concur with the physician assistant's documentation above.   The patient's status has not changed from the original H&P.  Any changes in documentation from the acute care chart have been noted above.

## 2023-03-25 NOTE — Progress Notes (Signed)
Genice Rouge, MD  Physician Physical Medicine and Rehabilitation   Consult Note    Signed   Date of Service: 03/04/2023  4:36 PM  Related encounter: ED to Hosp-Admission (Discharged) from 02/21/2023 in [facility]   Signed     Expand All Collapse All  Show:Clear all [x] Written[x] Templated[] Copied  Added by: [x] Lovorn, Aundra Millet, MD  [] Hover for details          Physical Medicine and Rehabilitation Consult Reason for Consult:CIR/ L MCA stroke Referring Physician: Dr Ramiro Harvest      HPI: Jacqueline [name] is a 23 y.o. 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 Pt was found to be more agitated and on 12/26- stroke extension was found by MRI- initially did require Precedex and restraints- still agitated, but better since Depakote started and upped and Seroquel added.  Also has Klonopin 1 mg BID and Prn tramadol and Fioricet- She also was started on Heparin gtt and plts currently 97k-  she has Cortrek, due to NPO status.    Per her SO, her LBM was today- just before I came in.  He said she hasn't talked, of nodded head yes/no since admission 9-10 days ago.              Review of Systems  Unable to perform ROS: Patient nonverbal        Past Medical History:  Diagnosis Date   Bacterial vaginitis 07/10/2016   Candida vaginitis 07/24/2016   Headache in pregnancy, antepartum, third trimester 07/24/2016   Kell isoimmunization during pregnancy               Past Surgical History:  Procedure Laterality Date   CESAREAN SECTION N/A 09/04/2016    Procedure: CESAREAN SECTION;  Surgeon: Levie Heritage, DO;  Location: [facility];  Service:  Obstetrics;  Laterality: N/A;   CESAREAN SECTION Bilateral     IR CT HEAD LTD   02/21/2023   IR PERCUTANEOUS ART THROMBECTOMY/INFUSION INTRACRANIAL INC DIAG ANGIO   02/21/2023   RADIOLOGY WITH ANESTHESIA N/A 02/21/2023    Procedure: IR WITH ANESTHESIA;  Surgeon: Julieanne Cotton, MD;  Location: [facility] OR;  Service: Radiology;  Laterality: N/A;             Family History  Problem Relation Age of Onset   Hypertension Maternal Grandmother     Diabetes Maternal Grandmother     Cancer Neg Hx          Social History:  reports that she has been smoking cigars. She has never used smokeless tobacco. She reports current alcohol use. She reports that she does not use drugs. Allergies:  Allergies  No Known Allergies         Medications Prior to Admission  Medication Sig Dispense Refill   acetaminophen (TYLENOL) 500 MG tablet Take 500 mg by mouth every 6 (six) hours as needed for moderate pain (pain score 4-6).       ibuprofen (ADVIL) 200 MG tablet Take 200-600 mg by mouth every 6 (six) hours as needed for moderate pain (pain score 4-6).       apixaban (ELIQUIS) 5 MG TABS tablet  Take apixaban 10 mg (2 tabs) twice daily until [facility] Feb 14 then reduce to apixaban 5 mg (1 tab) twice daily (Patient not taking: Reported on 02/22/2023) 66 tablet 0          Home: Home Living Family/patient expects to be discharged to:: Private residence Living Arrangements: Children (6 and 5 yo) Available Help at Discharge: Family, Available 24 hours/day (boyfriend, mom, or sister) Type of Home: Apartment Home Access: Stairs to enter Secretary/administrator of Steps: 1 flight Entrance Stairs-Rails: None (walls) Home Layout: One level Bathroom Shower/Tub: Engineer, manufacturing systems: Standard Home Equipment: Grab bars - tub/shower  Functional History: Prior Function Prior Level of Function : Independent/Modified Independent, Driving Mobility Comments: independent ADLs Comments: independent Functional  Status:  Mobility: Bed Mobility Overal bed mobility: Needs Assistance Bed Mobility: Supine to Sit, Sit to Supine Supine to sit: Total assist, +2 for physical assistance Sit to supine: +2 for physical assistance, Total assist General bed mobility comments: posterior and R lateral lean,  pushing with LUE Transfers Overall transfer level: Needs assistance Equipment used: 2 person hand held assist Transfers: Sit to/from Stand, Bed to chair/wheelchair/BSC Sit to Stand: Max assist, +2 physical assistance Bed to/from chair/wheelchair/BSC transfer type:: Stand pivot Stand pivot transfers: Max assist, +2 physical assistance  Lateral/Scoot Transfers: +2 physical assistance, Max assist General transfer comment: deferred Ambulation/Gait General Gait Details: unable   ADL: ADL Overall ADL's : Needs assistance/impaired General ADL Comments: Pt requiring Total A for ADLs and bed mobility   Cognition: Cognition Overall Cognitive Status: Difficult to assess Arousal/Alertness: Awake/alert Orientation Level: Other (comment) (uta) Attention: Sustained Sustained Attention:  (sustained attention to PO trials well initially) Behaviors: Other (comment) (yelling, not agitated with SLP but has been agitated with other staff) Cognition Arousal: Alert Behavior During Therapy: Restless, Agitated Overall Cognitive Status: Difficult to assess General Comments: follows command to bring LLE toward EOB, to straighten LLE, and to lift LUE up; moaning/crying out while sitting EOB and began to strongly push back against therapist, also kicking wtih LLE Difficult to assess due to: Impaired communication   Blood pressure 104/69, pulse 82, temperature 99.5 F (37.5 C), temperature source Axillary, resp. rate 18, height 5\' 4"  (1.626 m), weight 75.7 kg, SpO2 100%, unknown if currently breastfeeding. Physical Exam Vitals and nursing note reviewed. Exam conducted with a chaperone present.  Constitutional:       Comments: Eyes closes, nonverbal, BF at bedside holding her hand; agitated, no acute distress; cortrak beeping- clogged  HENT:     Head: Normocephalic and atraumatic.     Comments: Cotrak in nare R facial droop- mild noted- pt cannot follow commands to stick tongue out    Right Ear: External ear normal.     Left Ear: External ear normal.     Nose: Nose normal. No congestion.     Mouth/Throat:     Mouth: Mucous membranes are dry.     Pharynx: Oropharynx is clear. No oropharyngeal exudate.     Comments: Hoarse voice when screaming out loud Eyes:     Comments: Eyes closed throughout  Cardiovascular:     Rate and Rhythm: Regular rhythm. Tachycardia present.     Heart sounds: Normal heart sounds. No murmur heard.    No gallop.     Comments: Rate mid 100's Pulmonary:     Effort: Pulmonary effort is normal. No respiratory distress.     Breath sounds: Normal breath sounds. No wheezing, rhonchi or rales.  Abdominal:     General:  Bowel sounds are normal. There is no distension.     Palpations: Abdomen is soft.     Tenderness: There is no abdominal tenderness.  Musculoskeletal:        General: Normal range of motion.     Cervical back: Neck supple. No tenderness.     Comments: No movement of RUE/RLE- to any sensory stimuli-  Good spontaneous movement of LUE/LLE- kicking and flailing  Skin:    General: Skin is warm and dry.  Neurological:     Comments: Was yelling in spite of BF/SO rubbing arm/leg in calming manner; did calm down for short period when rubbed forehead Nonverbal- not nodding head yes/no to commands No clonus Equivocal hoffman's on RUE No increased tone Fell back asleep then woke up crying/agitated multiple times  Psychiatric:     Comments: agitated        Lab Results Last 24 Hours       Results for orders placed or performed during the hospital encounter of 02/21/23 (from the past 24 hours)  Glucose, capillary     Status: None    Collection Time: 03/03/23  7:42 PM   Result Value Ref Range    Glucose-Capillary 92 70 - 99 mg/dL  Glucose, capillary     Status: None    Collection Time: 03/04/23  1:12 AM  Result Value Ref Range    Glucose-Capillary 88 70 - 99 mg/dL  Glucose, capillary     Status: None    Collection Time: 03/04/23  4:11 AM  Result Value Ref Range    Glucose-Capillary 96 70 - 99 mg/dL  Heparin level (unfractionated)     Status: None    Collection Time: 03/04/23  8:07 AM  Result Value Ref Range    Heparin Unfractionated 0.48 0.30 - 0.70 IU/mL  Basic metabolic panel     Status: Abnormal    Collection Time: 03/04/23  8:07 AM  Result Value Ref Range    Sodium 135 135 - 145 mmol/L    Potassium 4.5 3.5 - 5.1 mmol/L    Chloride 102 98 - 111 mmol/L    CO2 23 22 - 32 mmol/L    Glucose, Bld 110 (H) 70 - 99 mg/dL    BUN 25 (H) 6 - 20 mg/dL    Creatinine, Ser 6.43 0.44 - 1.00 mg/dL    Calcium 9.2 8.9 - 32.9 mg/dL    GFR, Estimated >51 >88 mL/min    Anion gap 10 5 - 15  CBC     Status: Abnormal    Collection Time: 03/04/23  8:07 AM  Result Value Ref Range    WBC 6.8 4.0 - 10.5 K/uL    RBC 3.72 (L) 3.87 - 5.11 MIL/uL    Hemoglobin 9.1 (L) 12.0 - 15.0 g/dL    HCT 41.6 (L) 60.6 - 46.0 %    MCV 75.0 (L) 80.0 - 100.0 fL    MCH 24.5 (L) 26.0 - 34.0 pg    MCHC 32.6 30.0 - 36.0 g/dL    RDW 30.1 (H) 60.1 - 15.5 %    Platelets 97 (L) 150 - 400 K/uL    nRBC 0.0 0.0 - 0.2 %  Glucose, capillary     Status: Abnormal    Collection Time: 03/04/23  8:59 AM  Result Value Ref Range    Glucose-Capillary 121 (H) 70 - 99 mg/dL  Glucose, capillary     Status: None    Collection Time: 03/04/23 11:40 AM  Result Value Ref Range  Glucose-Capillary 94 70 - 99 mg/dL  Glucose, capillary     Status: Abnormal    Collection Time: 03/04/23  4:11 PM  Result Value Ref Range    Glucose-Capillary 100 (H) 70 - 99 mg/dL       Imaging Results (Last 48 hours)  CT HEAD WO CONTRAST ( ) Result Date: 03/03/2023 CLINICAL DATA:  Stroke, follow up. EXAM: CT HEAD  WITHOUT CONTRAST TECHNIQUE: Contiguous axial images were obtained from the base of the skull through the vertex without intravenous contrast. RADIATION DOSE REDUCTION: This exam was performed according to the departmental dose-optimization program which includes automated exposure control, adjustment of the mA and/or kV according to patient size and/or use of iterative reconstruction technique. COMPARISON:  Head CT 03/01/2023. FINDINGS: Brain: Continued evolution of the infarcts in the posterior left MCA territory and left ACA-MCA border zone. No acute hemorrhage or significant mass effect. Unchanged subcortical hypoattenuation deep to the right central sulcus. No hydrocephalus or extra-axial collection. No mass effect or midline shift. 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 Skull: No calvarial fracture or suspicious bone lesion. Skull base is unremarkable. Sinuses/Orbits: No acute finding. Other: None. IMPRESSION: 1. Continued evolution of the infarcts in the posterior left MCA territory and left ACA-MCA border zone. No acute hemorrhage or significant mass effect. 2. 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 Electronically Signed   By: Orvan Falconer M.D.   On: 03/03/2023 16:57    DG Abd 1 View Result Date: 03/03/2023 CLINICAL DATA:  Abdominal pain EXAM: ABDOMEN - 1 VIEW COMPARISON:  02/28/2023 FINDINGS: Mild diffuse gaseous distention of the colon. No small bowel dilatation. No suspicious calcifications or mass effect. Unchanged position of Dobbhoff tube with tip located either within the distal stomach or proximal duodenum. IMPRESSION: 1. Mild diffuse gaseous distention of the colon, similar to prior examination. No small bowel dilatation to indicate ileus or obstruction. 2. Dobbhoff tube unchanged in  position, terminating in the distal stomach or proximal duodenum. Electronically Signed   By: Acquanetta Belling M.D.   On: 03/03/2023 13:00         Assessment/Plan: Diagnosis: L MCA stroke Does the need for close, 24 hr/day medical supervision in concert with the patient's rehab needs make it unreasonable for this patient to be served in a less intensive setting? Yes Co-Morbidities requiring supervision/potential complications: R hemiplegia; dysphagia, agitation, aphasia- nonverbal; Cortrak;  Due to bladder management, bowel management, safety, skin/wound care, disease management, medication administration, pain management, and patient education, does the patient require 24 hr/day rehab nursing? Yes Does the patient require coordinated care of a physician, rehab nurse, therapy disciplines of PT, OT and SLP to address physical and functional deficits in the context of the above medical diagnosis(es)? Yes Addressing deficits in the following areas: balance, endurance, locomotion, strength, transferring, bowel/bladder control, bathing, dressing, feeding, grooming, toileting, cognition, speech, language, and swallowing Can the patient actively participate in an intensive therapy program of at least 3 hrs of therapy per day at least 5 days per week? Potentially The potential for patient to make measurable gains while on inpatient rehab is fair Anticipated functional outcomes upon discharge from inpatient rehab are min assist and mod assist  with PT, min assist and mod assist with OT, mod assist and max assist with SLP. Estimated rehab length of stay to reach the  above functional goals is: 3-4 weeks- but not ready yet! Anticipated discharge destination:  not sure yet- pt is total A of 2 today to accomplish tasks- is nonverbal and agitated Overall Rehab/Functional Prognosis:  not sure yet   RECOMMENDATIONS: This patient's condition is appropriate for continued rehabilitative care in the following setting:  CIR Patient has agreed to participate in recommended program. N/A Note that insurance prior authorization may be required for reimbursement for recommended care.   Comment:  1, Patient on Depakote 1000 mg BID and Seroquel 50 mg BID-although HR is elevated, cannot tolerate a beta blocker with her low BP- running 100s-120s systolic- - agitation is her biggest issue right now- could be generalized agitation, but also could be due to HA's/pain- suggest Topamax 25 mg BID- can also be calming, as well as treat/prevent HA's.  2. After started topamax, within 1-2 days, can try Gabapentin 100 mg TID- can help with agitation as well, but dose low enough shouldn't be as sedating.  3. If get patient more calm, then can try Amantadine 100 mg daily- for initiation and wake her up better- if still needed? 4. No major signs of spasticity on exam, but will keep an eye on it, since she's at high risk.  5. Bowels working well.  6. Not appropriate Quite YET for intp rehab, but will be ready, I would think in a few days-1 week at most-   7. Thank you for this consult     I spent a total of 67   minutes on total care today- >50% coordination of care- due to  Review of chart, labs, imaging, vitals and d/w OT as well as nursing- also interview with SO and exam of patient- d/w admissions coordinator and documentation.    Genice Rouge, MD 03/04/2023           Routing History

## 2023-03-25 NOTE — Progress Notes (Signed)
   03/25/23 1735  Assess: MEWS Score  Temp  (patient refused)  BP 115/75  Pulse Rate (!) 120  Resp 20  Level of Consciousness Alert  SpO2 100 %  O2 Device Room Air  Assess: MEWS Score  MEWS Temp 0  MEWS Systolic 0  MEWS Pulse 2  MEWS RR 0  MEWS LOC 0  MEWS Score 2  MEWS Score Color Yellow  Assess: if the MEWS score is Yellow or Red  Were vital signs accurate and taken at a resting state? No, vital signs rechecked  Does the patient meet 2 or more of the SIRS criteria? No  Does the patient have a confirmed or suspected source of infection? No  MEWS guidelines implemented  Yes, yellow  Treat  MEWS Interventions Considered administering scheduled or prn medications/treatments as ordered  Take Vital Signs  Increase Vital Sign Frequency  Yellow: Q2hr x1, continue Q4hrs until patient remains green for 12hrs  Escalate  MEWS: Escalate Yellow: Discuss with charge nurse and consider notifying provider and/or RRT  Notify: Charge Nurse/RN  Name of Charge Nurse/RN Notified [facility]  Provider Notification  Provider Name/Title Delle Reining  Date Provider Notified 03/25/23  Time Provider Notified 1744  Method of Notification Call  Notification Reason Other (Comment)  Provider response No new orders  Date of Provider Response 03/25/23  Time of Provider Response 1744  Assess: SIRS CRITERIA  SIRS Temperature  0  SIRS Respirations  0  SIRS Pulse 1  SIRS WBC 0  SIRS Score Sum  1

## 2023-03-25 NOTE — Progress Notes (Signed)
The patient's mother called up to the unit sounding impaired/altered. She stated that she was receiving calls from people threatening the patient.  Notified Facilities manager for [facility] and 3W Director [employer].  Patient is to discharge to [facility] 4W 14, handoff completed with Lurena Joiner RN at bedside and the above information was relayed to [facility] RN staff as well.   Patient transported via bed to 4W 14.  All belongings at bedside sent with patient. Significant other at bedside [facility] aware of transfer.

## 2023-03-25 NOTE — Progress Notes (Signed)
Inpatient Rehabilitation Admission Medication Review by a Pharmacist  A complete drug regimen review was completed for this patient to identify any potential clinically significant medication issues.  High Risk Drug Classes Is patient taking? Indication by Medication  Antipsychotic No   Anticoagulant Yes Apixaban - hypercoaguability from lupus anticoagulant; CVA prophylaxis, hx PE  Antibiotic Yes Neosporin to PEG site - prophylaxis  Opioid Yes PRN Oxycodone - perceived pain  Antiplatelet No   Hypoglycemics/insulin No   Vasoactive Medication No   Chemotherapy No   Other Yes Clonazepam - mood stabilization Gabapentin - mood stabilization, pain Topiramate - mood stabilization, headache prevention Vitamin B-12, MVI, folate, iron polysaccharides - supplements Miralax - laxative  PRNs: Acetaminophen - mild pain, temp >99.5 F  Melatonin - sleep     Type of Medication Issue Identified Description of Issue Recommendation(s)  Drug Interaction(s) (clinically significant)     Duplicate Therapy     Allergy     No Medication Administration End Date     Incorrect Dose     Additional Drug Therapy Needed     Significant med changes from prior encounter (inform family/care partners about these prior to discharge). PRN Ibuprofen discontinued. All other meds are new, except Apixaban and prn acetaminophen. Communicate changes with patient/family prior to discharge.  Other       Clinically significant medication issues were identified that warrant physician communication and completion of prescribed/recommended actions by midnight of the next day:  No  Pharmacist comments:   Time spent performing this drug regimen review (minutes):  20   Dennie Fetters, [place] 03/25/2023 4:41 PM

## 2023-03-26 ENCOUNTER — Inpatient Hospital Stay (HOSPITAL_COMMUNITY): Payer: Medicaid Other

## 2023-03-26 DIAGNOSIS — R6510 Systemic inflammatory response syndrome (SIRS) of non-infectious origin without acute organ dysfunction: Secondary | ICD-10-CM | POA: Diagnosis present

## 2023-03-26 DIAGNOSIS — D509 Iron deficiency anemia, unspecified: Secondary | ICD-10-CM

## 2023-03-26 DIAGNOSIS — T85848A Pain due to other internal prosthetic devices, implants and grafts, initial encounter: Secondary | ICD-10-CM | POA: Diagnosis not present

## 2023-03-26 DIAGNOSIS — D6862 Lupus anticoagulant syndrome: Secondary | ICD-10-CM

## 2023-03-26 DIAGNOSIS — K567 Ileus, unspecified: Secondary | ICD-10-CM | POA: Diagnosis not present

## 2023-03-26 DIAGNOSIS — R109 Unspecified abdominal pain: Secondary | ICD-10-CM

## 2023-03-26 DIAGNOSIS — I63512 Cerebral infarction due to unspecified occlusion or stenosis of left middle cerebral artery: Secondary | ICD-10-CM | POA: Diagnosis not present

## 2023-03-26 DIAGNOSIS — R7401 Elevation of levels of liver transaminase levels: Secondary | ICD-10-CM | POA: Diagnosis present

## 2023-03-26 DIAGNOSIS — D696 Thrombocytopenia, unspecified: Secondary | ICD-10-CM

## 2023-03-26 DIAGNOSIS — E871 Hypo-osmolality and hyponatremia: Secondary | ICD-10-CM | POA: Diagnosis present

## 2023-03-26 DIAGNOSIS — Z86711 Personal history of pulmonary embolism: Secondary | ICD-10-CM | POA: Diagnosis present

## 2023-03-26 DIAGNOSIS — R748 Abnormal levels of other serum enzymes: Secondary | ICD-10-CM

## 2023-03-26 DIAGNOSIS — D649 Anemia, unspecified: Secondary | ICD-10-CM

## 2023-03-26 HISTORY — DX: Systemic inflammatory response syndrome (sirs) of non-infectious origin without acute organ dysfunction: R65.10

## 2023-03-26 LAB — COMPREHENSIVE METABOLIC PANEL
ALT: 58 U/L — ABNORMAL HIGH (ref 0–44)
AST: 44 U/L — ABNORMAL HIGH (ref 15–41)
Albumin: 2.7 g/dL — ABNORMAL LOW (ref 3.5–5.0)
Alkaline Phosphatase: 97 U/L (ref 38–126)
Anion gap: 9 (ref 5–15)
BUN: 13 mg/dL (ref 6–20)
CO2: 21 mmol/L — ABNORMAL LOW (ref 22–32)
Calcium: 8.7 mg/dL — ABNORMAL LOW (ref 8.9–10.3)
Chloride: 99 mmol/L (ref 98–111)
Creatinine, Ser: 0.73 mg/dL (ref 0.44–1.00)
GFR, Estimated: >60 mL/min (ref >60)
Glucose, Bld: 114 mg/dL — ABNORMAL HIGH (ref 70–99)
Potassium: 3.7 mmol/L (ref 3.5–5.1)
Sodium: 129 mmol/L — ABNORMAL LOW (ref 135–145)
Total Bilirubin: 0.4 mg/dL (ref 0.0–1.2)
Total Protein: 7.3 g/dL (ref 6.5–8.1)

## 2023-03-26 LAB — CBC WITH DIFFERENTIAL/PLATELET
Abs Immature Granulocytes: 0.05 10*3/uL (ref 0.00–0.07)
Basophils Absolute: 0 10*3/uL (ref 0.0–0.1)
Basophils Relative: 0 %
Eosinophils Absolute: 0 10*3/uL (ref 0.0–0.5)
Eosinophils Relative: 0 %
HCT: 31.6 % — ABNORMAL LOW (ref 36.0–46.0)
Hemoglobin: 11.1 g/dL — ABNORMAL LOW (ref 12.0–15.0)
Immature Granulocytes: 0 %
Lymphocytes Relative: 12 %
Lymphs Abs: 1.5 10*3/uL (ref 0.7–4.0)
MCH: 27.6 pg (ref 26.0–34.0)
MCHC: 35.1 g/dL (ref 30.0–36.0)
MCV: 78.6 fL — ABNORMAL LOW (ref 80.0–100.0)
Monocytes Absolute: 1.3 10*3/uL — ABNORMAL HIGH (ref 0.1–1.0)
Monocytes Relative: 10 %
Neutro Abs: 10 10*3/uL — ABNORMAL HIGH (ref 1.7–7.7)
Neutrophils Relative %: 78 %
Platelets: 88 10*3/uL — ABNORMAL LOW (ref 150–400)
RBC: 4.02 MIL/uL (ref 3.87–5.11)
RDW: 21.5 % — ABNORMAL HIGH (ref 11.5–15.5)
WBC: 12.8 10*3/uL — ABNORMAL HIGH (ref 4.0–10.5)
nRBC: 0 % (ref 0.0–0.2)

## 2023-03-26 LAB — PROCALCITONIN: Procalcitonin: <0.1 ng/mL

## 2023-03-26 LAB — LACTIC ACID, PLASMA
Lactic Acid, Venous: 1.2 mmol/L (ref 0.5–1.9)
Lactic Acid, Venous: 1.3 mmol/L (ref 0.5–1.9)

## 2023-03-26 MED ORDER — APIXABAN 5 MG PO TABS
5.0000 mg | ORAL_TABLET | Freq: Two times a day (BID) | ORAL | Status: DC
  Administered 2023-03-26 – 2023-03-28 (×4): 5 mg
  Filled 2023-03-26 (×4): qty 1

## 2023-03-26 MED ORDER — METRONIDAZOLE 500 MG/100ML IV SOLN
500.0000 mg | Freq: Two times a day (BID) | INTRAVENOUS | Status: DC
  Administered 2023-03-26 – 2023-03-28 (×4): 500 mg via INTRAVENOUS
  Filled 2023-03-26 (×5): qty 100

## 2023-03-26 MED ORDER — VITAMIN B-12 1000 MCG PO TABS
2000.0000 ug | ORAL_TABLET | Freq: Every day | ORAL | Status: DC
  Administered 2023-03-27 – 2023-03-28 (×2): 2000 ug
  Filled 2023-03-26 (×2): qty 2

## 2023-03-26 MED ORDER — CLONAZEPAM 0.5 MG PO TABS
0.5000 mg | ORAL_TABLET | Freq: Every day | ORAL | Status: DC
  Administered 2023-03-26 – 2023-04-22 (×28): 0.5 mg via ORAL
  Filled 2023-03-26 (×28): qty 1

## 2023-03-26 MED ORDER — SODIUM CHLORIDE 0.9 % IV SOLN: INTRAVENOUS | Status: DC

## 2023-03-26 MED ORDER — TOPIRAMATE 25 MG PO TABS: 25.0000 mg | ORAL_TABLET | Freq: Two times a day (BID) | ORAL | Status: DC

## 2023-03-26 MED ORDER — IOHEXOL 350 MG/ML SOLN
75.0000 mL | Freq: Once | INTRAVENOUS | Status: AC | PRN
  Administered 2023-03-26: 75 mL via INTRAVENOUS

## 2023-03-26 MED ORDER — FOLIC ACID 1 MG PO TABS
1.0000 mg | ORAL_TABLET | Freq: Every day | ORAL | Status: DC
  Administered 2023-03-27 – 2023-03-28 (×2): 1 mg
  Filled 2023-03-26 (×2): qty 1

## 2023-03-26 MED ORDER — SODIUM CHLORIDE 0.9 % IV SOLN
2.0000 g | INTRAVENOUS | Status: DC
  Administered 2023-03-26 – 2023-03-27 (×2): 2 g via INTRAVENOUS
  Filled 2023-03-26 (×2): qty 20

## 2023-03-26 MED ORDER — POLYSACCHARIDE IRON COMPLEX 150 MG PO CAPS
150.0000 mg | ORAL_CAPSULE | Freq: Every day | ORAL | Status: DC
  Administered 2023-03-27 – 2023-03-28 (×2): 150 mg
  Filled 2023-03-26 (×2): qty 1

## 2023-03-26 MED ORDER — SORBITOL 70 % SOLN: 30.0000 mL | Freq: Every day | Status: DC | PRN

## 2023-03-26 MED ORDER — OXYCODONE HCL 5 MG PO TABS
5.0000 mg | ORAL_TABLET | ORAL | Status: DC | PRN
  Administered 2023-03-26 – 2023-03-28 (×10): 5 mg
  Filled 2023-03-26 (×10): qty 1

## 2023-03-26 MED ORDER — FREE WATER
150.0000 mL | Freq: Four times a day (QID) | Status: DC
  Administered 2023-03-26: 150 mL

## 2023-03-26 MED ORDER — VITAMIN B-12 1000 MCG PO TABS: 2000.0000 ug | ORAL_TABLET | Freq: Every day | ORAL | Status: DC

## 2023-03-26 MED ORDER — FOLIC ACID 1 MG PO TABS: 1.0000 mg | ORAL_TABLET | Freq: Every day | ORAL | Status: DC

## 2023-03-26 MED ORDER — OXYCODONE HCL 5 MG PO TABS
5.0000 mg | ORAL_TABLET | ORAL | Status: DC | PRN
  Administered 2023-03-26: 5 mg via ORAL
  Filled 2023-03-26: qty 1

## 2023-03-26 MED ORDER — TOPIRAMATE 25 MG PO TABS
25.0000 mg | ORAL_TABLET | Freq: Two times a day (BID) | ORAL | Status: DC
  Administered 2023-03-26 – 2023-03-28 (×4): 25 mg
  Filled 2023-03-26 (×4): qty 1

## 2023-03-26 MED ORDER — POLYETHYLENE GLYCOL 3350 17 G PO PACK: 17.0000 g | PACK | Freq: Every day | ORAL | Status: DC

## 2023-03-26 MED ORDER — APIXABAN 5 MG PO TABS: 5.0000 mg | ORAL_TABLET | Freq: Two times a day (BID) | ORAL | Status: DC

## 2023-03-26 MED ORDER — ADULT MULTIVITAMIN W/MINERALS CH
1.0000 | ORAL_TABLET | Freq: Every day | ORAL | Status: DC
Start: 2023-03-27 — End: 2023-03-28
  Administered 2023-03-27 – 2023-03-28 (×2): 1
  Filled 2023-03-26 (×2): qty 1

## 2023-03-26 MED ORDER — GABAPENTIN 100 MG PO CAPS
100.0000 mg | ORAL_CAPSULE | Freq: Three times a day (TID) | ORAL | Status: DC
  Administered 2023-03-26 – 2023-04-23 (×82): 100 mg via ORAL
  Filled 2023-03-26 (×85): qty 1

## 2023-03-26 MED ORDER — MELATONIN 5 MG PO TABS: 5.0000 mg | ORAL_TABLET | Freq: Every evening | ORAL | Status: DC | PRN

## 2023-03-26 MED ORDER — POLYETHYLENE GLYCOL 3350 17 G PO PACK
17.0000 g | PACK | Freq: Every day | ORAL | Status: DC
  Administered 2023-03-27 – 2023-03-28 (×2): 17 g
  Filled 2023-03-26 (×2): qty 1

## 2023-03-26 MED ORDER — POLYSACCHARIDE IRON COMPLEX 150 MG PO CAPS: 150.0000 mg | ORAL_CAPSULE | Freq: Every day | ORAL | Status: DC

## 2023-03-26 MED ORDER — MELATONIN 5 MG PO TABS
5.0000 mg | ORAL_TABLET | Freq: Every evening | ORAL | Status: DC | PRN
  Administered 2023-03-27 – 2023-03-31 (×4): 5 mg
  Filled 2023-03-26 (×4): qty 1

## 2023-03-26 MED ORDER — SMOG ENEMA
300.0000 mL | Freq: Once | RECTAL | Status: AC
  Administered 2023-03-27: 300 mL via RECTAL
  Filled 2023-03-26: qty 960

## 2023-03-26 MED ORDER — BISACODYL 10 MG RE SUPP
10.0000 mg | Freq: Every day | RECTAL | Status: DC | PRN
  Administered 2023-03-26: 10 mg via RECTAL
  Filled 2023-03-26: qty 1

## 2023-03-26 NOTE — Progress Notes (Signed)
Inpatient Rehabilitation  Patient information reviewed and entered into eRehab system by Oyuki Hogan M. Eri Mcevers, M.A., CCC/SLP, PPS Coordinator.  Information including medical coding, functional ability and quality indicators will be reviewed and updated through discharge.    

## 2023-03-26 NOTE — Evaluation (Signed)
Physical Therapy Assessment and Plan  Patient Details  Name: Jacqueline [name] MRN: [id number] Date of Birth: 2000-04-05  PT Diagnosis: Abnormal posture, Abnormality of gait, Cognitive deficits, Coordination disorder, Difficulty walking, Hemiparesis dominant, Hypertonia, Impaired cognition, Impaired sensation, Muscle weakness, and Pain in abdomen Rehab Potential: Good ELOS: 3-4 weeks   Today's Date: 03/26/2023 PT Individual Time: 0347-4259 PT Individual Time Calculation (min): 57 min  Today's Date: 03/26/2023 PT Missed Time: 18 Minutes Missed Time Reason: Nursing care   Hospital Problem: Principal Problem:   Left middle cerebral artery stroke [facility])   Past Medical History:  Past Medical History:  Diagnosis Date   Bacterial vaginitis 07/10/2016   Candida vaginitis 07/24/2016   Headache in pregnancy, antepartum, third trimester 07/24/2016   Kell isoimmunization during pregnancy    Past Surgical History:  Past Surgical History:  Procedure Laterality Date   CESAREAN SECTION N/A 09/04/2016   Procedure: CESAREAN SECTION;  Surgeon: Levie Heritage, DO;  Location: [facility];  Service: Obstetrics;  Laterality: N/A;   CESAREAN SECTION Bilateral    IR CT HEAD LTD  02/21/2023   IR GASTROSTOMY TUBE MOD SED  03/21/2023   IR PERCUTANEOUS ART THROMBECTOMY/INFUSION INTRACRANIAL INC DIAG ANGIO  02/21/2023   RADIOLOGY WITH ANESTHESIA N/A 02/21/2023   Procedure: IR WITH ANESTHESIA;  Surgeon: Julieanne Cotton, MD;  Location: [facility] OR;  Service: Radiology;  Laterality: N/A;    Assessment & Plan Clinical Impression: Patient is a 23 y.o. year old female with medical history significant for Kells isoimmunization pregnancy, unprovoked PE with positive lupus anticoagulant for which she stopped Eliquis in June 2024, hemoglobin C trait, anemia iron deficient thrombocytopenia, tobacco use. Per chart review patient lives with her 2 children ages 35 and 18. She has a boyfriend mother and sister with good  support. 1 level home with 1 flight of stairs to entry. Reportedly independent prior to admission. Presented 02/21/2023 with expressive aphasia and right side weakness. Per chart patient admitted to drinking a significant amount of alcohol the night prior and did take Suboxone that has not been prescribed to her. Chemistries unremarkable except hemoglobin 8.4, alcohol of 14, urine drug screen negative. Cranial CT scan showed mild loss of gray-Mihalik differentiation left parietal temporal region representing left MCA territory acute infarct versus artifact. No acute hemorrhage. CTA showed occluded left proximal to mid M2 MCA. MRI follow-up showed fairly extensive acute left MCA territory and watershed territory infarcts within the left cerebral hemisphere. Small acute right frontal parietal cortical/subcortical infarcts also present. Underwent left common carotid arteriogram per interventional radiology with attempted mechanical thrombectomy which unfortunately was unsuccessful Dr.Deveshwar. Echocardiogram ejection fraction of 60 to 65% no wall motion abnormalities. Hospital course bouts of agitation requiring restraints as well as Precedex. EEG negative for seizure but did show diffuse encephalopathy. A repeat MRI was completed for stroke follow-up 02/27/2023 showing increased acute infarcts in left posterior frontal and parietal lobes, anterior left frontal lobe and right frontal lobe, and additional punctate acute infarcts in left occipital, left frontal lobe and medial parietal lobe. No restricted diffusion in the left midbrain, cerebral peduncle, thalamus and posterior limb of internal capsule. Mild increased mass effect from the larger areas of the left posterior frontal, left temporal and left parietal lobe with known more than 3 mm of left-to-right midline shift. Neurology continues to follow patient is currently maintained on Eliquis. Gastrostomy tube was placed 03/21/2023 for dysphagia per interventional  radiology Dr. Loreta Ave. Her diet has since been advanced to a regular consistency. Anemia  iron deficient/thrombocytopenia. She was last seen by hematology 2/24 for thrombocytopenia with no explanation for thrombocytopenia proposed latest platelet count 77,000 and continue to follow. Hospital course patient did spike low-grade fever E. coli UTI placed on Rocephin x 5 days completed. Therapy evaluations completed due to patient decreased functional mobility was admitted for a comprehensive rehab program.   Patient currently requires total A +2 with mobility secondary to muscle weakness and muscle joint tightness, decreased cardiorespiratoy endurance, impaired timing and sequencing, abnormal tone, unbalanced muscle activation, decreased coordination, and decreased motor planning, decreased initiation, decreased attention, decreased awareness, decreased problem solving, decreased safety awareness, decreased memory, and delayed processing, and decreased sitting balance, decreased standing balance, decreased postural control, hemiplegia, and decreased balance strategies.  Prior to hospitalization, patient was independent  with mobility and lived with Significant other, Family in a Apartment home.  Home access is 1 flightStairs to enter.  Patient will benefit from skilled PT intervention to maximize safe functional mobility, minimize fall risk, and decrease caregiver burden for planned discharge home with 24 hour supervision.  Anticipate patient will benefit from follow up OP at discharge.  PT - End of Session Activity Tolerance: Tolerates 30+ min activity with multiple rests Endurance Deficit: Yes Endurance Deficit Description: decreaed activity tolerance PT Assessment Rehab Potential (ACUTE/IP ONLY): Good PT Barriers to Discharge: Inaccessible home environment;Home environment access/layout;Incontinence;Wound Care;Other (comments) PT Barriers to Discharge Comments: R hemi, expressive aphasia, flight of steps  to enter apartment, PEG tube PT Patient demonstrates impairments in the following area(s): Balance;Endurance;Motor;Nutrition;Pain;Perception;Safety;Sensory;Skin Integrity;Behavior;Edema PT Transfers Functional Problem(s): Bed Mobility;Bed to Chair;Car;Furniture PT Locomotion Functional Problem(s): Ambulation;Wheelchair Mobility;Stairs PT Plan PT Intensity: Minimum of 1-2 x/day ,45 to 90 minutes PT Frequency: 5 out of 7 days PT Duration Estimated Length of Stay: 3-4 weeks PT Treatment/Interventions: Ambulation/gait training;Discharge planning;Functional mobility training;Psychosocial support;Therapeutic Activities;Balance/vestibular training;Disease management/prevention;Neuromuscular re-education;Skin care/wound management;Therapeutic Exercise;Wheelchair propulsion/positioning;Cognitive remediation/compensation;DME/adaptive equipment instruction;Pain management;Splinting/orthotics;UE/LE Strength taining/ROM;Community reintegration;Functional electrical stimulation;Patient/family education;Stair training;UE/LE Coordination activities PT Transfers Anticipated Outcome(s): CGA with LRAD PT Locomotion Anticipated Outcome(s): CGA with LRAD PT Recommendation Follow Up Recommendations: Outpatient PT Patient destination: Home Equipment Recommended: To be determined   PT Evaluation Precautions/Restrictions Precautions Precautions: Fall Precaution Comments: PEG, expressive aphasia with agitation, Rt hemi Restrictions Weight Bearing Restrictions Per Provider Order: No Pain Interference Pain Interference Pain Effect on Sleep: 8. Unable to answer Pain Interference with Therapy Activities: 8. Unable to answer Pain Interference with Day-to-Day Activities: 8. Unable to answer Home Living/Prior Functioning Home Living Available Help at Discharge: Family;Available 24 hours/day (boyfriend, mom, sister. Pt has 2 minor children) Type of Home: Apartment Home Access: Stairs to enter Entrance Stairs-Number  of Steps: 1 flight Entrance Stairs-Rails: None Home Layout: One level Bathroom Shower/Tub: Engineer, manufacturing systems: Standard Additional Comments: All info taken from chart review as pt without family/SO present and pt with severe aphasia/pain and unable to tolerate questions. Will need to verify DC plan at later time. Did facetime briefly with boyfriend [name]  Lives With: Significant other;Family Prior Function Level of Independence: Independent with basic ADLs;Independent with homemaking with ambulation;Independent with gait;Independent with transfers  Able to Take Stairs?: Yes Driving: Yes Vision/Perception  Vision - History Ability to See in Adequate Light: 0 Adequate Perception Perception: Impaired Preception Impairment Details: Inattention/Neglect Perception-Other Comments: Rt sided inattention Praxis Praxis: Impaired Praxis Impairment Details: Motor planning;Perseveration  Cognition Overall Cognitive Status: Difficult to assess Arousal/Alertness: Awake/alert Orientation Level: Oriented to person Attention: Sustained Sustained Attention: Appears intact Memory: Impaired Awareness: Impaired Problem Solving: Impaired Behaviors: Poor frustration tolerance  Safety/Judgment: Impaired Comments: pt with poor frustration tolerance and mild agitation with communication barriers/pain Sensation Sensation Light Touch: Impaired by gross assessment Hot/Cold:  (unable to determine due to expressive aphasia) Proprioception: Impaired by gross assessment Stereognosis:  (unable to determine due to expressive aphasia) Additional Comments: pt unable to state due to expressive aphasia but suspect decreased sensation in RUE/RLE Coordination Gross Motor Movements are Fluid and Coordinated: No Fine Motor Movements are Fluid and Coordinated: No Coordination and Movement Description: pain, R hemiplegia, impaired motor planning/sequencing, hypertonia in RUE and RLE Finger Nose Finger Test:  impaired on LUE Motor  Motor Motor: Hemiplegia;Abnormal tone Motor - Skilled Clinical Observations: pain, R hemiplegia, impaired motor planning/sequencing, hypertonia in RUE and RLE  Trunk/Postural Assessment  Cervical Assessment Cervical Assessment: Within Functional Limits Thoracic Assessment Thoracic Assessment: Within Functional Limits Lumbar Assessment Lumbar Assessment: Exceptions to [facility] (posterior pelvic tilt) Postural Control Postural Control: Deficits on evaluation Righting Reactions: delayed and inadequate on R Protective Responses: delayed and inadequate on R  Balance Balance Balance Assessed: Yes Static Sitting Balance Static Sitting - Balance Support: Feet supported;Left upper extremity supported Static Sitting - Level of Assistance: 5: Stand by assistance (CGA) Dynamic Sitting Balance Dynamic Sitting - Balance Support: Feet supported;No upper extremity supported Dynamic Sitting - Level of Assistance: 5: Stand by assistance (CGA) Static Standing Balance Static Standing - Balance Support: Bilateral upper extremity supported Static Standing - Level of Assistance: 3: Mod assist Dynamic Standing Balance Dynamic Standing - Balance Support: Bilateral upper extremity supported Dynamic Standing - Level of Assistance: 1: +2 Total assist (+2) Extremity Assessment  RLE Assessment RLE Assessment: Not tested General Strength Comments: grossly 3/5 - hypertonia in RLE LLE Assessment LLE Assessment: Not tested General Strength Comments: grossly generalized to 4-/5  Care Tool Care Tool Bed Mobility Roll left and right activity   Roll left and right assist level: Maximal Assistance - Patient 25 - 49%    Sit to lying activity        Lying to sitting on side of bed activity   Lying to sitting on side of bed assist level: the ability to move from lying on the back to sitting on the side of the bed with no back support.: 2 Helpers     Care Tool Transfers Sit to stand  transfer   Sit to stand assist level: 2 Helpers    Chair/bed transfer   Chair/bed transfer assist level: 2 Web designer transfer activity did not occur: Safety/medical concerns        Care Tool Locomotion Ambulation   Assist level: 2 helpers (+3 assist) Assistive device: Other (comment) (3 muskateer assist + chair follow) Max distance: 79ft  Walk 10 feet activity   Assist level: 2 helpers Assistive device: Other (comment) (3 muskateer assist + chair follow)   Walk 50 feet with 2 turns activity Walk 50 feet with 2 turns activity did not occur: Safety/medical concerns      Walk 150 feet activity Walk 150 feet activity did not occur: Safety/medical concerns      Walk 10 feet on uneven surfaces activity Walk 10 feet on uneven surfaces activity did not occur: Safety/medical concerns      Stairs   Assist level: 2 helpers Stairs assistive device: Other (comment) (3 muskateer assist) Max number of stairs: 1 (1in)  Walk up/down 1 step activity   Walk up/down 1 step (curb) assist level: 2 helpers Walk up/down 1 step or curb assistive device: Other (comment) (  3 muskateer assist)  Walk up/down 4 steps activity Walk up/down 4 steps activity did not occur: Safety/medical concerns      Walk up/down 12 steps activity Walk up/down 12 steps activity did not occur: Safety/medical concerns      Pick up small objects from floor Pick up small object from the floor (from standing position) activity did not occur: Safety/medical concerns      Wheelchair Is the patient using a wheelchair?: Yes Type of Wheelchair: Manual Wheelchair activity did not occur: Safety/medical concerns      Wheel 50 feet with 2 turns activity Wheelchair 50 feet with 2 turns activity did not occur: Safety/medical concerns    Wheel 150 feet activity Wheelchair 150 feet activity did not occur: Safety/medical concerns      Refer to Care Plan for Long Term Goals  SHORT TERM GOAL WEEK 1 PT Short  Term Goal 1 (Week 1): pt will transfer supine<>sitting EOB with mod A of 1 PT Short Term Goal 2 (Week 1): pt will transfer sit<>stand with LRAD and mod A of 1 PT Short Term Goal 3 (Week 1): pt will ambulate 74ft with LRAD and max A of 1  Recommendations for other services: None   Skilled Therapeutic Intervention Evaluation completed (see details above and below) with education on PT POC and goals and individual treatment initiated with focus on functional mobility/transfers, generalized strengthening and endurance, dynamic standing balance/coordination, and ambulation. Pt with expressive aphasia and with inconsistent yes/no answers to questions but was able to express pain surrounding PEG tube - RUE pulling on PEG tube site due to increased tone in wrist/hand (may benefit from custom splint). Of note, required increased time with mobility due to poor frustration tolerance from aphasia. Received pt supine in with with RN changing brief - PT assisted with care. Pt able to roll L/R with max A and use of bedrails with assist to manage RUE and RLE due to increased tone. Pt transferred supine<>sitting R EOB from flat bed using chuck pads with mod A +2. Pt performed multiple stands throughout session with min/mod A +2 HHA. Pt transferred into recliner via stand<>pivot with +2 HHA and assist to manage RLE. Transported to/from room in recliner dependently, then stood and ambulated 3ft with +3 assist - +2 providing 3 musketeer assist with +3 to manage WC and IV. Pt required assist to place RLE and prevent from buckling/hyperextension. Stepped on/off 1in step with total A+2, then returned to room and transferred recliner<>bed via stand<>pivot with +2 assist and assist manage RLE (RN requesting pt return to bed to change IV and prepare for CT scan). Transitioned into supine with +2 assist and required +2 assist to remove pants. Concluded session with pt supine in bed left in care of RN. 18 minutes missed of skilled  physical therapy due to nursing care.   Mobility Bed Mobility Bed Mobility: Rolling Right;Rolling Left;Supine to Sit Rolling Right: Maximal Assistance - Patient 25-49% Rolling Left: Maximal Assistance - Patient 25-49% Supine to Sit: 2 Helpers Transfers Transfers: Sit to [employer] Sit to Stand: 2 Helpers Stand Pivot Transfers: 2 Advertising account planner (Assistive device): 2 person hand held Product manager Ambulation: Yes Gait Assistance: 2 Helpers (+3 assist for chair follow) Gait Distance (Feet): 25 Feet Assistive device: 2 person hand held assist;Other (Comment) (+ chair follow) Gait Assistance Details: Verbal cues for technique;Verbal cues for precautions/safety;Verbal cues for gait pattern Gait Assistance Details: assist to place RLE and to prevent hyperextension > buckling. Noted  hypertonia in RUE/RLE Gait Gait: Yes Gait Pattern: Impaired Gait Pattern: Step-to pattern;Decreased step length - right;Decreased step length - left;Decreased stance time - right;Decreased stride length;Decreased hip/knee flexion - right;Decreased dorsiflexion - right;Decreased weight shift to right;Poor foot clearance - right;Poor foot clearance - left;Narrow base of support Gait velocity: decreased Stairs / Additional Locomotion Stairs: Yes Stairs Assistance: 2 Helpers Stair Management Technique: Other (comment) (3 muskateer assist) Number of Stairs: 1 Height of Stairs: 1   Discharge Criteria: Patient will be discharged from PT if patient refuses treatment 3 consecutive times without medical reason, if treatment goals not met, if there is a change in medical status, if patient makes no progress towards goals or if patient is discharged from hospital.  The above assessment, treatment plan, treatment alternatives and goals were discussed and mutually agreed upon: by patient  Huntley Dec PT, DPT 03/26/2023, 2:59 PM

## 2023-03-26 NOTE — Progress Notes (Signed)
Referring Physician(s): Dr Theodora Blow  Supervising Physician: Marliss Coots  Patient Status:  [facility] - In-pt  Chief Complaint:  Abd pain  Subjective:  Perc G tube placed in IR 03/21/23 Pt has been complaining of abd pain at site x 24 hours or so Abd xray  today shows good position. RN states using G tube - no issue--- except more painful last 2 days Wbc 12.8 today afeb  Allergies: Patient has no known allergies.  Medications: Prior to Admission medications   Medication Sig Start Date End Date Taking? Authorizing Provider  acetaminophen (TYLENOL) 500 MG tablet Take 500 mg by mouth every 6 (six) hours as needed for moderate pain (pain score 4-6).    [provider]  apixaban (ELIQUIS) 5 MG TABS tablet Take apixaban 10 mg (2 tabs) twice daily until [facility] Feb 14 then reduce to apixaban 5 mg (1 tab) twice daily Patient not taking: Reported on 02/22/2023 04/14/22   Alberteen Sam, MD  clonazePAM (KLONOPIN) 0.5 MG tablet Place 1 tablet (0.5 mg total) into feeding tube at bedtime. 03/25/23   Dorcas Carrow, MD  cyanocobalamin 2000 MCG tablet Place 1 tablet (2,000 mcg total) into feeding tube daily. 03/26/23   Dorcas Carrow, MD  folic acid (FOLVITE) 1 MG tablet Place 1 tablet (1 mg total) into feeding tube daily. 03/26/23   Dorcas Carrow, MD  gabapentin (NEURONTIN) 100 MG capsule Take 1 capsule (100 mg total) by mouth 3 (three) times daily. 03/25/23   Dorcas Carrow, MD  iron polysaccharides (NIFEREX) 150 MG capsule Place 1 capsule (150 mg total) into feeding tube daily. 03/26/23   Dorcas Carrow, MD  Nutritional Supplements (FEEDING SUPPLEMENT, OSMOLITE 1.5 CAL,) LIQD Place 355 mLs into feeding tube 4 (four) times daily. 03/25/23   Dorcas Carrow, MD  oxyCODONE (ROXICODONE) 5 MG/5ML solution Take 5 mLs (5 mg total) by mouth every 6 (six) hours as needed (perceived pain). 03/25/23   Dorcas Carrow, MD  polyethylene glycol (MIRALAX / GLYCOLAX) 17 g packet Place 17 g into feeding  tube daily. 03/26/23   Dorcas Carrow, MD  topiramate (TOPAMAX) 25 MG tablet Take 1 tablet (25 mg total) by mouth 2 (two) times daily. 03/25/23   Dorcas Carrow, MD  Water For Irrigation, Sterile (FREE WATER) SOLN Place 150 mLs into feeding tube 4 (four) times daily. 03/25/23   Dorcas Carrow, MD     Vital Signs: BP 121/72 (BP Location: Right Arm)   Pulse (!) 117   Temp 98.3 F (36.8 C) (Oral)   Resp 20   Ht 5\' 4"  (1.626 m)   Wt 169 lb 15.6 oz (77.1 kg)   SpO2 98%   BMI 29.18 kg/m   Physical Exam Vitals reviewed.  Skin:    General: Skin is warm.     Comments: Site is reddened Small 2 x2 cm redness and fluctuant skin When press on area-- pus is expelled from site Site is extremely painful Bumper is cinched to skin       Imaging: DG Abd Portable 1V Result Date: 03/26/2023 CLINICAL DATA:  Abdominal pain. EXAM: PORTABLE ABDOMEN - 1 VIEW COMPARISON:  03/21/2023 FINDINGS: Percutaneous gastrostomy tube overlies the medial left abdomen. There is diffuse gaseous distension of small bowel and colon with prominent stool visible in the right colon and rectum. Visualized bony anatomy unremarkable. IMPRESSION: Diffuse gaseous distension of small bowel and colon with prominent stool in the right colon and rectum. Imaging features are most compatible with ileus. Electronically Signed  By: Kennith Center M.D.   On: 03/26/2023 09:31   DG Chest Port 1V same Day Result Date: 03/26/2023 CLINICAL DATA:  Fever. EXAM: PORTABLE CHEST 1 VIEW COMPARISON:  None Available. FINDINGS: The lungs are clear without focal pneumonia, edema, pneumothorax or pleural effusion. Cardiopericardial silhouette is at upper limits of normal for size. No acute bony abnormality. IMPRESSION: No active disease. Electronically Signed   By: Kennith Center M.D.   On: 03/26/2023 08:49    Labs:  CBC: Recent Labs    03/20/23 1047 03/21/23 0808 03/22/23 0619 03/26/23 0210  WBC 7.9 9.1 13.6* 12.8*  HGB 11.5* 12.0 11.1* 11.1*   HCT 34.6* 35.8* 31.7* 31.6*  PLT 91* 76* 77* 88*    COAGS: Recent Labs    02/21/23 1042 03/19/23 1651 03/20/23 0652 03/21/23 0808  INR 1.1  --   --  1.1  APTT 42* 82* >200*  --     BMP: Recent Labs    03/18/23 0518 03/19/23 0559 03/22/23 0619 03/26/23 0210  NA 138 134* 133* 129*  K 4.0 3.7 3.6 3.7  CL 104 102 104 99  CO2 23 23 20* 21*  GLUCOSE 108* 123* 133* 114*  BUN 17 19 13 13   CALCIUM 9.5 9.3 8.9 8.7*  CREATININE 0.80 0.76 0.85 0.73  GFRNONAA >60 >60 >60 >60    LIVER FUNCTION TESTS: Recent Labs    03/08/23 0634 03/10/23 0623 03/12/23 0557 03/19/23 0559 03/26/23 0210  BILITOT 0.4 0.4  --  0.5 0.4  AST 45* 34  --  34 44*  ALT 44 31  --  41 58*  ALKPHOS 79 76  --  77 97  PROT 7.5 7.3  --  7.3 7.3  ALBUMIN 2.9* 2.8* 2.7* 3.2* 2.7*    Assessment and Plan:  Abd pain post G tube placement 1/17 in IR In use; afeb; xray showing good position; wbc up to 12.8 today Pus can be expelled from skin site--- reddened and fluctuant I am discussing with Rad as to next step/needs    Electronically Signed: Robet Leu, PA-C 03/26/2023, 11:42 AM   I spent a total of 25 Minutes at the the patient's bedside AND on the patient's hospital floor or unit, greater than 50% of which was counseling/coordinating care for possible G tube site infection

## 2023-03-26 NOTE — Evaluation (Signed)
Occupational Therapy Assessment and Plan  Patient Details  Name: Jacqueline [name] MRN: [id number] Date of Birth: 02/15/2001  OT Diagnosis: abnormal posture, acute pain, cognitive deficits, disturbance of vision, hemiplegia affecting dominant side, muscle weakness (generalized), and abdominal pain Rehab Potential: Rehab Potential (ACUTE ONLY): Fair ELOS: 3-4 weeks   Today's Date: 03/26/2023 OT Individual Time: 1040-1130 OT Individual Time Calculation (min): 50 min  and Today's Date: 03/26/2023 OT Missed Time: 25 Minutes Missed Time Reason: Pain    Hospital Problem: Principal Problem:   Left middle cerebral artery stroke [facility])   Past Medical History:  Past Medical History:  Diagnosis Date   Bacterial vaginitis 07/10/2016   Candida vaginitis 07/24/2016   Headache in pregnancy, antepartum, third trimester 07/24/2016   Kell isoimmunization during pregnancy    Past Surgical History:  Past Surgical History:  Procedure Laterality Date   CESAREAN SECTION N/A 09/04/2016   Procedure: CESAREAN SECTION;  Surgeon: Levie Heritage, DO;  Location: [facility];  Service: Obstetrics;  Laterality: N/A;   CESAREAN SECTION Bilateral    IR CT HEAD LTD  02/21/2023   IR GASTROSTOMY TUBE MOD SED  03/21/2023   IR PERCUTANEOUS ART THROMBECTOMY/INFUSION INTRACRANIAL INC DIAG ANGIO  02/21/2023   RADIOLOGY WITH ANESTHESIA N/A 02/21/2023   Procedure: IR WITH ANESTHESIA;  Surgeon: Julieanne Cotton, MD;  Location: [facility] OR;  Service: Radiology;  Laterality: N/A;    Assessment & Plan Clinical Impression:  Jacqueline [name] is a 23 year old right-handed female with medical history significant for Kells isoimmunization pregnancy, unprovoked PE with positive lupus anticoagulant for which she stopped Eliquis in June 2024, hemoglobin C trait, anemia iron deficient thrombocytopenia, tobacco use. Per chart review patient lives with her 2 children ages 11 and 62. She has a boyfriend mother and sister with good support. 1  level home with 1 flight of stairs to entry. Reportedly independent prior to admission. Presented 02/21/2023 with expressive aphasia and right side weakness. Per chart patient admitted to drinking a significant amount of alcohol the night prior and did take Suboxone that has not been prescribed to her. Chemistries unremarkable except hemoglobin 8.4, alcohol of 14, urine drug screen negative. Cranial CT scan showed mild loss of gray-Orrico differentiation left parietal temporal region representing left MCA territory acute infarct versus artifact. No acute hemorrhage. CTA showed occluded left proximal to mid M2 MCA. MRI follow-up showed fairly extensive acute left MCA territory and watershed territory infarcts within the left cerebral hemisphere. Small acute right frontal parietal cortical/subcortical infarcts also present. Underwent left common carotid arteriogram per interventional radiology with attempted mechanical thrombectomy which unfortunately was unsuccessful Dr.Deveshwar. Echocardiogram ejection fraction of 60 to 65% no wall motion abnormalities. Hospital course bouts of agitation requiring restraints as well as Precedex. EEG negative for seizure but did show diffuse encephalopathy. A repeat MRI was completed for stroke follow-up 02/27/2023 showing increased acute infarcts in left posterior frontal and parietal lobes, anterior left frontal lobe and right frontal lobe, and additional punctate acute infarcts in left occipital, left frontal lobe and medial parietal lobe. No restricted diffusion in the left midbrain, cerebral peduncle, thalamus and posterior limb of internal capsule. Mild increased mass effect from the larger areas of the left posterior frontal, left temporal and left parietal lobe with known more than 3 mm of left-to-right midline shift. Neurology continues to follow patient is currently maintained on Eliquis. Gastrostomy tube was placed 03/21/2023 for dysphagia per interventional radiology Dr.  Loreta Ave. Her diet has since been advanced to a regular consistency.  Anemia iron deficient/thrombocytopenia. She was last seen by hematology 2/24 for thrombocytopenia with no explanation for thrombocytopenia proposed latest platelet count 77,000 and continue to follow. Hospital course patient did spike low-grade fever E. coli UTI placed on Rocephin x 5 days completed. Therapy evaluations completed due to patient decreased functional mobility was admitted for a comprehensive rehab program. Patient transferred to CIR on 03/25/2023 .    Patient currently requires total with basic self-care skills secondary to muscle weakness, decreased cardiorespiratoy endurance, abnormal tone, decreased coordination, and decreased motor planning, decreased attention, decreased awareness, decreased problem solving, and decreased safety awareness, and decreased sitting balance, decreased standing balance, decreased postural control, and hemiplegia.  Prior to hospitalization, patient could complete self-care independently.  Patient will benefit from skilled intervention to decrease level of assist with basic self-care skills and increase independence with basic self-care skills prior to discharge home with care partner.  Anticipate patient will require minimal physical assistance and follow up outpatient.  OT - End of Session Activity Tolerance: Tolerates < 10 min activity, no significant change in vital signs Endurance Deficit: Yes Endurance Deficit Description: limited tolerance to even bed level ADLs OT Assessment Rehab Potential (ACUTE ONLY): Fair OT Barriers to Discharge: Inaccessible home environment;Home environment access/layout;Decreased caregiver support;Incontinence;Behavior OT Patient demonstrates impairments in the following area(s): Balance;Behavior;Cognition;Edema;Endurance;Motor;Nutrition;Pain;Safety;Sensory;Vision;Skin Integrity OT Basic ADL's Functional Problem(s):  Eating;Grooming;Bathing;Dressing;Toileting OT Transfers Functional Problem(s): Toilet;Tub/Shower OT Additional Impairment(s): Fuctional Use of Upper Extremity OT Plan OT Intensity: Minimum of 1-2 x/day, 45 to 90 minutes OT Frequency: 5 out of 7 days OT Duration/Estimated Length of Stay: 3-4 weeks OT Treatment/Interventions: Balance/vestibular training;Discharge planning;Functional electrical stimulation;Pain management;Self Care/advanced ADL retraining;Therapeutic Activities;UE/LE Coordination activities;Cognitive remediation/compensation;Disease mangement/prevention;Functional mobility training;Patient/family education;Therapeutic Exercise;Skin care/wound managment;Visual/perceptual remediation/compensation;DME/adaptive equipment instruction;Neuromuscular re-education;Psychosocial support;Splinting/orthotics;UE/LE Strength taining/ROM;Wheelchair propulsion/positioning OT Self Feeding Anticipated Outcome(s): Supervision OT Basic Self-Care Anticipated Outcome(s): Min A OT Toileting Anticipated Outcome(s): Min A OT Bathroom Transfers Anticipated Outcome(s): Min A OT Recommendation Recommendations for Other Services: Neuropsych consult;Therapeutic Recreation consult Therapeutic Recreation Interventions: Pet therapy Patient destination: Home Follow Up Recommendations: Outpatient OT Equipment Recommended: To be determined   OT Evaluation Precautions/Restrictions  Precautions Precautions: Fall Precaution Comments: PEG, expressive aphasia with agitation, Rt hemi Restrictions Weight Bearing Restrictions Per Provider Order: No Home Living/Prior Functioning Home Living Available Help at Discharge: Family, Available 24 hours/day (boyfriend, mom, sister. Pt has 2 minor children) Type of Home: Apartment Home Access: Stairs to enter Entrance Stairs-Number of Steps: 1 flight Entrance Stairs-Rails: None Home Layout: One level Bathroom Shower/Tub: Engineer, manufacturing systems:  Standard Additional Comments: All info taken from chart review as pt without family/SO present and pt with severe aphasia/pain and unable to tolerate questions. Will need to verify DC plan at later time  Lives With: Significant other, Family IADL History Homemaking Responsibilities: Yes Meal Prep Responsibility: Primary Child Care Responsibility: Primary Prior Function Level of Independence: Independent with basic ADLs, Independent with homemaking with ambulation, Independent with gait, Independent with transfers  Able to Take Stairs?: Yes Driving: Yes Vision Baseline Vision/History: 0 No visual deficits Ability to See in Adequate Light: 0 Adequate Patient Visual Report: Other (comment) (no response by pt) Vision Assessment?: Vision impaired- to be further tested in functional context Perception  Perception: Impaired Perception-Other Comments: Rt sided inattention Praxis Praxis: Impaired Praxis Impairment Details: Motor planning;Perseveration Cognition Cognition Overall Cognitive Status: Difficult to assess Arousal/Alertness: Awake/alert Orientation Level: Nonverbal/unable to assess (aphasia limiting assessment) Memory: Impaired Attention: Sustained Sustained Attention: Appears intact Awareness: Impaired Problem Solving: Impaired Behaviors: Poor frustration tolerance Safety/Judgment:  Impaired Comments: pt with poor frustration tolerance and mild agitation with communication barriers/pain Brief Interview for Mental Status (BIMS) Repetition of Three Words (First Attempt): No answer (Unable to complete due to severe aphasia) Temporal Orientation: Year: No answer Temporal Orientation: Month: No answer Temporal Orientation: Day: No answer Recall: "Sock": No answer Recall: "Blue": No answer Recall: "Bed": No answer BIMS Summary Score: 99 Sensation Coordination Gross Motor Movements are Fluid and Coordinated: No Fine Motor Movements are Fluid and Coordinated: No Coordination  and Movement Description: pain and Rt hemiplegia limiting GM coordination Finger Nose Finger Test: impaired on LUE Motor  Motor Motor: Hemiplegia;Abnormal tone Motor - Skilled Clinical Observations: pain, R hemiplegia, impaired motor planning/sequencing, hypertonia in RUE and RLE  Trunk/Postural Assessment  Cervical Assessment Cervical Assessment: Within Functional Limits Thoracic Assessment Thoracic Assessment: Within Functional Limits Lumbar Assessment Lumbar Assessment: Exceptions to [facility] (posterior pelvic tilt) Postural Control Postural Control: Deficits on evaluation Righting Reactions: delayed and inadequate on R Protective Responses: delayed and inadequate on R  Balance Balance Balance Assessed: Yes Static Sitting Balance Static Sitting - Balance Support: Feet supported;Left upper extremity supported Static Sitting - Level of Assistance: 5: Stand by assistance (CGA) Dynamic Sitting Balance Dynamic Sitting - Balance Support: Feet supported;No upper extremity supported Dynamic Sitting - Level of Assistance: 5: Stand by assistance (CGA) Static Standing Balance Static Standing - Balance Support: Bilateral upper extremity supported Static Standing - Level of Assistance: 3: Mod assist Dynamic Standing Balance Dynamic Standing - Balance Support: Bilateral upper extremity supported Dynamic Standing - Level of Assistance: 1: +2 Total assist (+2) Extremity/Trunk Assessment RUE Assessment RUE Assessment: Exceptions to [facility] RUE Body System: Neuro Brunstrum levels for arm and hand: Arm;Hand Brunstrum level for arm: Stage II Synergy is developing Brunstrum level for hand: Stage II Synergy is developing RUE Tone RUE Tone: Severe LUE Assessment LUE Assessment: Within Functional Limits  Care Tool Care Tool Self Care Eating   Eating Assist Level: Moderate Assistance - Patient 50 - 74%    Oral Care    Oral Care Assist Level: Moderate Assistance - Patient 50 - 74%    Bathing    Body parts bathed by patient: Right arm;Chest;Abdomen;Front perineal area Body parts bathed by helper: Left arm;Buttocks;Right upper leg;Left upper leg;Right lower leg;Left lower leg;Face   Assist Level: Maximal Assistance - Patient 24 - 49%    Upper Body Dressing(including orthotics)   What is the patient wearing?: Pull over shirt   Assist Level: Maximal Assistance - Patient 25 - 49%    Lower Body Dressing (excluding footwear)   What is the patient wearing?: Incontinence brief;Pants Assist for lower body dressing: Total Assistance - Patient < 25%    Putting on/Taking off footwear   What is the patient wearing?: Non-skid slipper socks Assist for footwear: Dependent - Patient 0%       Care Tool Toileting Toileting activity   Assist for toileting: Dependent - Patient 0%     Care Tool Bed Mobility Roll left and right activity   Roll left and right assist level: Maximal Assistance - Patient 25 - 49%    Sit to lying activity        Lying to sitting on side of bed activity         Care Tool Transfers Sit to stand transfer        Chair/bed transfer         Toilet transfer Toilet transfer activity did not occur: Safety/medical concerns (pt with severe pain)  Care Tool Cognition  Expression of Ideas and Wants Expression of Ideas and Wants: 1. Rarely/Never expressess or very difficult - rarely/never expresses self or speech is very difficult to understand  Understanding Verbal and Non-Verbal Content Understanding Verbal and Non-Verbal Content: 2. Sometimes understands - understands only basic conversations or simple, direct phrases. Frequently requires cues to understand   Memory/Recall Ability Memory/Recall Ability : That he or she is in a hospital/hospital unit   Refer to Care Plan for Long Term Goals  SHORT TERM GOAL WEEK 1 OT Short Term Goal 1 (Week 1): Pt will sit EOB for ADLs for > 3 mins with mod A for sitting balance OT Short Term Goal 2 (Week 1): Pt will  complete functional toilet transfer with max A using LRAD OT Short Term Goal 3 (Week 1): Pt with attend to RUE during ADL task with min verbal cues during functional task OT Short Term Goal 4 (Week 1): Pt will donn LB clothing with max A using AE PRN  Recommendations for other services: Neuropsych and Therapeutic Recreation  Pet therapy   Skilled Therapeutic Intervention On first attempt, pt received upright in bed, asleep. Pt did open eyes once to OT with increased time and noxious stimuli however unable to maintain arousal. Per nursing, pt did not sleep all night and had rough morning with discomfort. Will re-attempt to see pt at later time.   2nd attempt Patient received upright in bed upon therapy arrival and agreeable to participate in OT evaluation. Education provided on OT purpose, therapy schedule, goals for therapy, and safety policy while in rehab. Pt was verbally responsive, but only appropriate to yes/no questions about 10% of the time. Pt with visible pain, grabbing at her lower abdomen, moaning, and crying and verbal perseveration with inappropriate words. Nursing already aware of pt status. OT provided gentle abdominal massage, multimodal calming techniques and distraction for pain reduction.  Patient unable to tolerate EOB or OOB tasks due to pain level. ADLs simulated/with clinical judgement, other than toileting. Patient demonstrates significant communication deficits, Rt hemiplegia with severe spasticity of RUE at hand and UE, cognitive impairments and functional balance/endurance resulting in difficulty completing BADL tasks without increased physical assist. Max A needed to roll R<> L for bed pan placement but despite time, pt unable to have BM. After time to understand pt's needs, pt with need to have BM but unable to go; nursing made aware of status and asked PA for suppository as pt agreeable to OT when offered. Pt will benefit from skilled OT services to focus on mentioned  deficits. See below for ADL and functional transfer performance. Pt remained semi supine in bed with direct care hand off to nursing at end of session. Pt missed 25 mins of OT intervention secondary to pain; OT will make up missed time as able.   ADL ADL Eating: Minimal assistance Where Assessed-Eating: Bed level Grooming: Moderate assistance (simulated/with clinical judgement due to pt status with high pain level) Where Assessed-Grooming: Bed level Upper Body Bathing: Moderate assistance (simulated/with clinical judgement due to pt status with high pain level) Where Assessed-Upper Body Bathing: Bed level Lower Body Bathing: Dependent (simulated/with clinical judgement due to pt status with high pain level) Where Assessed-Lower Body Bathing: Bed level Upper Body Dressing: Maximal assistance (simulated/with clinical judgement due to pt status with high pain level) Where Assessed-Upper Body Dressing: Bed level Lower Body Dressing: Dependent (simulated/with clinical judgement due to pt status with high pain level) Where Assessed-Lower Body Dressing: Bed level  Toileting: Dependent Where Assessed-Toileting: Bed level Toilet Transfer: Unable to assess Tub/Shower Transfer: Unable to assess Tub/Shower Transfer Method: Unable to assess Film/video editor: Unable to assess Film/video editor Method: Unable to assess Mobility  Bed Mobility Bed Mobility: Rolling Right;Rolling Left;Supine to Sit Rolling Right: Maximal Assistance - Patient 25-49% Rolling Left: Maximal Assistance - Patient 25-49%    Discharge Criteria: Patient will be discharged from OT if patient refuses treatment 3 consecutive times without medical reason, if treatment goals not met, if there is a change in medical status, if patient makes no progress towards goals or if patient is discharged from hospital.  The above assessment, treatment plan, treatment alternatives and goals were discussed and mutually agreed upon:  by patient and by family  Melvyn Novas, MS, OTR/L  03/26/2023, 3:22 PM

## 2023-03-26 NOTE — Discharge Summary (Signed)
 Physician Discharge Summary  Patient ID: Jacqueline [name] MRN: [id number] DOB/AGE: 03/26/2000 23 y.o.  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 coli UTI Tobacco/alcohol use Ileus Anemia/menorrhagia   Discharged Condition: Stable  Significant Diagnostic Studies: US PELVIS (TRANSABDOMINAL ONLY) Result Date: 04/05/2023 CLINICAL DATA:  Menorrhagia. EXAM: TRANSABDOMINAL ULTRASOUND OF PELVIS TECHNIQUE: Transabdominal ultrasound examination of the pelvis was performed including evaluation of the uterus, ovaries, adnexal regions, and pelvic cul-de-sac. Patient declined transvaginal imaging. COMPARISON:  None Available. FINDINGS: Uterus Measurements: 11.0 x 4.0 x 2.8 cm = volume: 64 mL. No fibroids or other mass visualized. Endometrium Thickness: 4 mm which is within normal limits. No focal abnormality visualized. Right ovary Measurements: 3.2 x 3.4 x 2.0 cm = volume: 11 mL. Normal appearance/no adnexal mass. Left ovary Measurements: 3.2 x 1.8 x 2.1 cm = volume: 6 mL. Normal appearance/no adnexal mass. Other findings:  No abnormal free fluid. IMPRESSION: No definite abnormality seen in the pelvis. Electronically Signed   By: Lupita Raider M.D.   On: 04/05/2023 17:36   DG Abd Portable 1V Result Date: 03/29/2023 CLINICAL DATA:  Ileus. EXAM: PORTABLE ABDOMEN - 1 VIEW COMPARISON:  One-view abdomen 03/28/2023. FINDINGS: Oral  contrast has progressed to the level of the rectum. The bowel gas pattern is normal. Gastric tube is stable. The lung bases are clear. IMPRESSION: 1. Oral contrast has progressed to the level of the rectum. 2. Normal bowel gas pattern. Electronically Signed   By: Marin Roberts M.D.   On: 03/29/2023 13:15   DG Abd 1 View Result Date: 03/28/2023 CLINICAL DATA:  Ileus EXAM: ABDOMEN - 1 VIEW portable COMPARISON:  03/28/23. FINDINGS: G tube overlies the stomach. Moderate stool in the distended right side of the colon. There is air in other loops of bowel diffusely. No obstruction or free air. Appearance is similar previous. IMPRESSION: No significant interval change. Electronically Signed   By: Karen Kays M.D.   On: 03/28/2023 14:41   IR Replc Gastro/Colonic Tube Percut W/Fluoro Result Date: 03/27/2023 INDICATION: 23 year old female status post pull-through image guided gastrostomy tube placement on 03/20/2018 presenting with tube malfunction and pain in tree site with CT evidence of retraction into the anterior abdominal wall soft tissues. EXAM: FLUOROSCOPIC GUIDED REPLACEMENT OF GASTROSTOMY TUBE COMPARISON:  None Available. MEDICATIONS: None. CONTRAST:  15 mL Omnipaque 300 administered into the gastric lumen FLUOROSCOPY TIME:  Three mGy COMPLICATIONS: None immediate. PROCEDURE: Informed written consent was obtained from the patient after a discussion of the risks, benefits and alternatives to treatment. Questions regarding the procedure were encouraged and answered. A timeout was performed prior to the initiation of the procedure. The upper abdomen and external portion of the existing gastrostomy tube was prepped and draped in the usual sterile fashion, and a sterile drape was applied covering the operative field. Maximum barrier sterile technique with sterile gowns and gloves were used for the procedure. A timeout was performed prior to the initiation of the procedure. The existing gastrostomy tube was  injected with a small amount  of contrast which demonstrated a preserved track into the gastric lumen. The existing gastrostomy tube was cannulated with a stiff Glidewire which was coiled within the gastric fundus. Over the stiff guide wire, the existing gastrostomy tube was removed and exchanged for a new 20-French MIC balloon inflatable gastrostomy tube. The balloon was inflated and disc was cinched. Contrast was injected and several spot fluoroscopic images were obtained in various obliquities to confirm appropriate intraluminal positioning. A dressing was placed. The patient tolerated the procedure well without immediate postprocedural complication. IMPRESSION: Successful fluoroscopic guided replacement of a new 20-French gastrostomy tube. The new gastrostomy tube is ready for immediate use. PLAN: Return in 6 months for routine gastrostomy tube exchange. Marliss Coots, MD [facility] Radiology Electronically Signed   By: Marliss Coots M.D.   On: 03/27/2023 12:36   CT ABDOMEN W CONTRAST Result Date: 03/27/2023 CLINICAL DATA:  Abdominal wall pain. Status post percutaneous gastrostomy tube placement 03/21/2023. EXAM: CT ABDOMEN WITH CONTRAST TECHNIQUE: Multidetector CT imaging of the abdomen was performed using the standard protocol following bolus administration of intravenous contrast. RADIATION DOSE REDUCTION: This exam was performed according to the departmental dose-optimization program which includes automated exposure control, adjustment of the mA and/or kV according to patient size and/or use of iterative reconstruction technique. CONTRAST:  75mL OMNIPAQUE IOHEXOL 350 MG/ML SOLN COMPARISON:  03/18/2023 FINDINGS: Lower chest: Dependent atelectasis. Hepatobiliary: No suspicious focal abnormality within the liver parenchyma. There is no evidence for gallstones, gallbladder wall thickening, or pericholecystic fluid. No intrahepatic or extrahepatic biliary  dilation. Pancreas: No focal mass lesion. No dilatation of the main duct. No intraparenchymal cyst. No peripancreatic edema. Spleen: No splenomegaly. No suspicious focal mass lesion. Adrenals/Urinary Tract: No adrenal nodule or mass. Kidneys unremarkable. Stomach/Bowel: Anterior wall the stomach is tented anteriorly by a percutaneous gastrostomy tube. There is edema in the left rectus sheath along the tract of the percutaneous nephrostomy tube with mild edema/fluid in the preperitoneal fat between the stomach and the anterior abdominal wall. Retention cap of the gastrostomy tube cannot be confirmed to be intraluminal on axial or sagittal imaging. There is a curvilinear radiopaque foreign body extending from the retention cap into the gastric lumen. Anterior wall the stomach in this region is thick with areas of apparent mural edema/fluid. There is apparent trace gas between the gastric wall in the retention cap which may be extraluminal. No small bowel dilatation within the visualized abdomen. Visualized segments of the ascending and transverse colon are distended with gas. Vascular/Lymphatic: No abdominal aortic aneurysm. No abdominal aortic atherosclerotic calcification. There is no gastrohepatic or hepatoduodenal ligament lymphadenopathy. No retroperitoneal or mesenteric lymphadenopathy. Other: There is some trace fluid adjacent to the liver and spleen. Musculoskeletal: No worrisome lytic or sclerotic osseous abnormality. IMPRESSION: 1. Mushroom retention cap of the percutaneous gastrostomy tube cannot be confirmed to be intraluminal on axial or sagittal imaging. There is a curvilinear radiopaque foreign body extending from the retention cap into the gastric lumen. Anterior wall the stomach in this region is thick with areas of apparent mural edema/fluid. There is apparent trace gas between the gastric wall and the retention cap which may be extraluminal. Further evaluation to confirm gastrostomy tube tip  placement recommended. 2. Edema in the left rectus sheath along the tract of the percutaneous gastrostomy tube with mild edema/fluid in the preperitoneal fat between the stomach and the anterior abdominal wall. These changes are not entirely unexpected 6 days after tube placement. 3. Trace fluid adjacent to the  liver and spleen. Electronically Signed   By: Kennith Center M.D.   On: 03/27/2023 05:15   DG Abd Portable 1V Result Date: 03/26/2023 CLINICAL DATA:  Abdominal pain. EXAM: PORTABLE ABDOMEN - 1 VIEW COMPARISON:  03/21/2023 FINDINGS: Percutaneous gastrostomy tube overlies the medial left abdomen. There is diffuse gaseous distension of small bowel and colon with prominent stool visible in the right colon and rectum. Visualized bony anatomy unremarkable. IMPRESSION: Diffuse gaseous distension of small bowel and colon with prominent stool in the right colon and rectum. Imaging features are most compatible with ileus. Electronically Signed   By: Kennith Center M.D.   On: 03/26/2023 09:31   DG Chest Port 1V same Day Result Date: 03/26/2023 CLINICAL DATA:  Fever. EXAM: PORTABLE CHEST 1 VIEW COMPARISON:  None Available. FINDINGS: The lungs are clear without focal pneumonia, edema, pneumothorax or pleural effusion. Cardiopericardial silhouette is at upper limits of normal for size. No acute bony abnormality. IMPRESSION: No active disease. Electronically Signed   By: Kennith Center M.D.   On: 03/26/2023 08:49   DG CHEST PORT 1 VIEW Result Date: 03/22/2023 CLINICAL DATA:  Fever EXAM: PORTABLE CHEST 1 VIEW COMPARISON:  None Available. FINDINGS: Lung volumes are small, but are stable since prior examination and are clear. No pneumothorax or pleural effusion. Cardiac size within normal limits. Pulmonary vascularity is normal. Nasoenteric feeding tube has been placed with its tip at the gastroesophageal junction. IMPRESSION: 1. Pulmonary hypoinflation. 2. Nasoenteric feeding tube tip at the gastroesophageal junction.  Advancement is recommended. Electronically Signed   By: Helyn Numbers M.D.   On: 03/22/2023 01:38   IR GASTROSTOMY TUBE MOD SED Result Date: 03/21/2023 INDICATION: 23 year old female presents for gastrostomy tube placement EXAM: PERC PLACEMENT GASTROSTOMY MEDICATIONS: 2 g Ancef; Antibiotics were administered within 1 hour of the procedure. ANESTHESIA/SEDATION: Versed 1.0 mg IV; Fentanyl 50 mcg IV Moderate Sedation Time:  10 minutes The patient was continuously monitored during the procedure by the interventional radiology nurse under my direct supervision. CONTRAST:  15mL OMNIPAQUE IOHEXOL 300 MG/ML SOLN - administered into the gastric lumen. FLUOROSCOPY: Radiation Exposure Index (as provided by the fluoroscopic device): 8 mGy Kerma COMPLICATIONS: None PROCEDURE: Informed written consent was obtained from the patient and the patient's family after a thorough discussion of the procedural risks, benefits and alternatives. All questions were addressed. Maximal Sterile Barrier Technique was utilized including caps, mask, sterile gowns, sterile gloves, sterile drape, hand hygiene and skin antiseptic. A timeout was performed prior to the initiation of the procedure. The epigastrium was prepped with Betadine in a sterile fashion, and a sterile drape was applied covering the operative field. A sterile gown and sterile gloves were used for the procedure. A 5-French orogastric tube is placed under fluoroscopic guidance. Scout imaging of the abdomen confirms barium within the transverse colon. The stomach was distended with gas. Under fluoroscopic guidance, an 18 gauge needle was utilized to puncture the anterior wall of the body of the stomach. An Amplatz wire was advanced through the needle passing a T fastener into the lumen of the stomach. The T fastener was secured for gastropexy. A 9-French sheath was inserted. A snare was advanced through the 9-French sheath. A Teena Dunk was advanced through the orogastric tube. It was  snared then pulled out the oral cavity, pulling the snare, as well. The leading edge of the gastrostomy was attached to the snare. It was then pulled down the esophagus and out the percutaneous site. Tube secured in place. Contrast was injected. Patient  tolerated the procedure well and remained hemodynamically stable throughout. No complications were encountered and no significant blood loss encountered. IMPRESSION: Status post fluoroscopic placed percutaneous gastrostomy tube, with 20 [place] pull-through. Signed, Yvone Neu. Miachel Roux, RPVI [facility] Radiology Electronically Signed   By: Gilmer Mor D.O.   On: 03/21/2023 16:47   DG Abd Portable 1V Result Date: 03/21/2023 CLINICAL DATA:  Abdominal discomfort. EXAM: PORTABLE ABDOMEN - 1 VIEW COMPARISON:  March 03, 2023. FINDINGS: No abnormal bowel dilatation is noted. Feeding tube tip is seen in proximal stomach. Residual contrast is noted in nondilated colon. Moderate stool burden is noted. IMPRESSION: Moderate stool burden.  No abnormal bowel dilatation. Electronically Signed   By: Lupita Raider M.D.   On: 03/21/2023 09:56   CT ABDOMEN WO CONTRAST Result Date: 03/18/2023 CLINICAL DATA:  dysphagia.  Gastrostomy tube evaluation EXAM: CT ABDOMEN WITHOUT CONTRAST TECHNIQUE: Multidetector CT imaging of the abdomen was performed following the standard protocol without IV contrast. RADIATION DOSE REDUCTION: This exam was performed according to the departmental dose-optimization program which includes automated exposure control, adjustment of the mA and/or kV according to patient size and/or use of iterative reconstruction technique. COMPARISON:  KUB, 03/03/2023.  CTA chest, 04/08/2022. FINDINGS: Lower chest: No acute abnormality. Hepatobiliary: Normal noncontrast appearance of the liver without focal abnormality. No gallstones, gallbladder wall thickening, or biliary dilatation. Pancreas: No pancreatic  ductal dilatation or surrounding inflammatory changes. Spleen: Normal in size without focal abnormality. Adrenals/Urinary Tract: Adrenal glands are unremarkable. Kidneys are normal, without renal calculi, focal lesion, or hydronephrosis. Stomach/Bowel: Stomach is decompressed and within normal limits. Small bore enteric feeding tube, with tube tip within stomach. Interposition of the splenic flexure of the LEFT upper quadrant imaged portions of the small bowel and colon are nondilated. Appendix is outside the field of view on this evaluation. No evidence of bowel wall thickening, distention, or inflammatory changes. Vascular/Lymphatic: No significant vascular findings are present. No enlarged abdominal or pelvic lymph nodes. Other: No abdominal wall hernia or abnormality. Musculoskeletal: No acute osseous findings. IMPRESSION: 1. Gastric position of small-bore enteric feeding tube. Consider advancement if transpyloric positioning is desired. 2. Intervening viscera with the splenic flexure and distal transverse colon overlying the gastric body and antrum. Anatomy is equivocal for percutaneous gastrostomy placement, with a trial of contrast opacification of colon and insufflation recommended. Final decision making is reserved for the performing physician Roanna Banning, MD [facility] Radiology Electronically Signed   By: Roanna Banning M.D.   On: 03/18/2023 13:50    Labs:  Basic Metabolic Panel: Recent Labs  Lab 04/08/23 1056  NA 138  K 3.7  CL 112*  CO2 19*  GLUCOSE 88  BUN 9  CREATININE 0.95  CALCIUM 8.8*    CBC: Recent Labs  Lab 04/08/23 1056 04/10/23 1035  WBC 6.1 5.0  NEUTROABS 4.0  --   HGB 8.3* 8.0*  HCT 24.0* 23.6*  MCV 82.8 83.4  PLT 103* 90*    CBG: No results for input(s): "GLUCAP" in the last 168 hours.  Family history.  Maternal grandmother with hypertension and diabetes.  Denies any colon cancer esophageal cancer or rectal  cancer  Brief HPI:   Siriah A Novicki is a 23 y.o. right-handed female with past medical history significant for Kells isoimmunization pregnancy, unprovoked PE with positive lupus anticoagulant for which she stopped Eliquis in June 2024, hemoglobin C trait, anemia iron deficiency/thrombocytopenia and tobacco use.  Per chart  review patient lives with her 2 children ages 70 and 5 she has a boyfriend and mother with good support as well as multiple family members.  Reportedly independent prior to admission.  Presented 02/21/2023 with expressive aphasia and right side weakness.  Per chart patient admitted to drinking a significant amount of alcohol the night prior and did take Suboxone that has not been prescribed to her.  Chemistries unremarkable except hemoglobin 8.4 alcohol of 14 urine drug screen negative.  Cranial CT scan showed mild loss of gray-Mower differentiation left parietal temporal region representing left MCA territory acute infarction versus artifact.  No acute hemorrhage.  CTA showed occluded left proximal to mid M2 MCA.  MRI follow-up showed fairly extensive acute left MCA territory and watershed territory infarct within the left cerebral hemisphere.  Small acute right frontal parietal cortical/subcortical infarcts also present.  Underwent left common carotid arteriogram per interventional radiology with attempted mechanical thrombectomy which unfortunately was unsuccessful.  Echocardiogram ejection fraction of 60 to 65% no wall motion abnormalities.  Hospital course bouts of agitation requiring restraints as well as Precedex.  EEG negative for seizure but did show diffuse encephalopathy.  A repeat MRI was completed for stroke follow-up 02/27/2023 showing increased acute infarct in the left posterior frontal parietal lobes, anterior left frontal lobe and right frontal lobe, and additional punctate acute infarcts left occipital, left frontal and medial parietal lobe.  No restricted diffusion in the  left midbrain, cerebral peduncle, thalamus and posterior limb of internal capsule.  Mild increased mass effect from the larger areas of the left posterior frontal left temporal and left parietal lobe with known more than 3 mm of left-to-right midline shift.  Neurology continues to follow maintained on Eliquis.  Gastrostomy tube was placed 03/21/2023 for dysphagia per interventional radiology and diet has been advanced.  Anemia iron deficiency with thrombocytopenia she was last seen by hematology services 2/24 for thrombocytopenia with no explanation for thrombocytopenia proposed latest platelet count 77,000 and continue to follow.  Hospital course patient did spike a low-grade fever E. coli UTI placed on Rocephin x 5 days and has been completed.  Therapy evaluations completed due to patient decreased functional mobility right side weakness was admitted for a comprehensive rehab program.   Hospital Course: Alizee A Kappes was admitted to rehab 03/25/2023 for inpatient therapies to consist of PT, ST and OT at least three hours five days a week. Past admission physiatrist, therapy team and rehab RN have worked together to provide customized collaborative inpatient rehab.  Pertaining to patient's left MCA territory infarct status post unsuccessful IR revascularization and thrombectomy.  Patient remained on Eliquis therapy.  Followed by neurology services.  Latest CT/MRI showed large area of encephalomalacia and gliosis in the left frontotemporoparietal insular region consistent with known left MCA infarction.  Pain manager use of Neurontin scheduled titrated as needed using Topamax for headaches and oxycodone for breakthrough pain.  Her Topamax is since been discontinued mood stabilization with Klonopin scheduled at nighttime melatonin to help aid in sleep.  Positive lupus anticoagulant with history of PE unprovoked/Kells isoimmunization pregnancy.  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  Anemia thrombocytopenia iron deficiency follow-up CBC maintained on Niferex monitoring of platelet counts no active bleeding.  Hospital course dysphagia gastrostomy tube placed 03/21/2023 per interventional radiology diet has been advanced to regular.  E. coli UTI antibiotic therapy completed.  History of tobacco alcohol use  patient receiving counts regards to cessation of these products.  Patient noted history of anemia/menorrhagia.  OB/GYN consulted placed on Megace with ultrasound the pelvis completed showing no definite abnormality seen in the pelvis.   Blood pressures were monitored on TID basis and remained soft and monitored     Rehab course: During patient's stay in rehab weekly team conferences were held to monitor patient's progress, set goals and discuss barriers to discharge. At admission, patient required mod max assist sit to stand max assist stand pivot transfers max assist supine to sit  Physical exam.  Blood pressure 107/71 pulse 93 temperature 99 respirations 18 oxygen saturation is 97% room air Constitutional.  No acute distress HEENT.  Right facial droop Eyes.  Pupils round and reactive to light left gaze preference Neck.  Supple nontender no JVD without thyromegaly Cardiac regular rate and rhythm without any extra sounds or murmur heard Abdomen.  Soft nontender PEG tube in place Respiratory.  Clear to auscultation without wheeze Musculoskeletal. Comments.  Left upper extremity/left lower extremity moving well against gravity could not complete testing due to some perseveration. Right upper extremity no voluntary movement seen right upper extremity or right lower extremity even to tactile stimuli Neurologic.  Alert she does make eye contact with examiner.  Patient is aphasic with some spontaneous simple word responses.  He/She  has had improvement in activity tolerance,  balance, postural control as well as ability to compensate for deficits. He/She has had improvement in functional use RUE/LUE  and RLE/LLE as well as improvement in awareness..  Working with energy conservation techniques.  Patient can don socks and shoes with assistance and perform wheelchair mobility with right Hemi technique moderate assist.  Ambulates 117 feet left hand-held assist.  Transferred wheelchair to bed stand pivot mod assist.  Seated at sink patient washed her face with set up assistance.  Minimal assist needed for oral care.  As sessions persist with ADLs completed all sit to stands contact-guard supervision dependent transfers with the use of stedy.  She did need some encouragement at times to her to participate with her ADLs.  Full family teaching completed plan discharge to home       Disposition:  There are no questions and answers to display.         Diet: Regular  Special Instructions: No driving smoking or alcohol  Medications at discharge. 1.  Tylenol as needed 2.  Eliquis 5 mg p.o. twice daily 3.  Klonopin 0.5 mg p.o. nightly 4.  Vitamin B12 2000 mcg p.o. daily 5.  Folic acid 1 mg p.o. daily 6.  Oxycodone 5 mg every 4 hours as needed pain 7.  MiraLAX twice daily hold for loose stools 8.Colace 100 mg BID 9.  Neurontin 100 mg 3 times daily 10.  Niferex 150 mg p.o. daily  11.  Multivitamin daily     30-35 minutes were spent completing discharge summary and discharge planning  Discharge Instructions     Ambulatory referral to Neurology   Complete by: As directed    An appointment is requested in approximately: 4 weeks left MCA infarction   Ambulatory referral to Physical Medicine Rehab   Complete by: As directed    Moderate complexity follow-up 1 to 2 weeks left MCA infarction        Follow-up Information     Raulkar, Drema Pry, MD Follow up.   Specialty: Physical Medicine and Rehabilitation Why: Office to call for appointment Contact  information: [address]  [address] [place] [address] [phone]         Gilmer Mor, DO Follow up.   Specialties: Interventional Radiology, Radiology Why: Call for appointment as needed Contact information: [address] [place] [address] [phone]         Johney Maine, MD Follow up.   Specialties: Hematology, Oncology Why: Call for appointment Contact information: [address] [place] [address] [phone]                 Signed: Charlton Amor 04/14/2023, 5:39 AM

## 2023-03-26 NOTE — Progress Notes (Signed)
Called Pam Love to update her on patient status. Blood cultures and other septic work up received and same followed. IVF N/S at 100 mls/hr initiated. Will update on call as needed. Safety maintained.

## 2023-03-26 NOTE — Evaluation (Signed)
Speech Language Pathology Assessment and Plan  Patient Details  Name: Jacqueline [name] MRN: [id number] Date of Birth: Mar 15, 2000  SLP Diagnosis: Aphasia;Dysphagia  Rehab Potential: Fair ELOS: 3-4 weeks    Today's Date: 03/26/2023 SLP Individual Time: 8413-2440 SLP Individual Time Calculation (min): 39 min and Today's Date: 03/26/2023 SLP Missed Time: 22 Minutes Missed Time Reason: Patient fatigue;Nursing care   Hospital Problem: Principal Problem:   Left middle cerebral artery stroke [facility])  Past Medical History:  Past Medical History:  Diagnosis Date   Bacterial vaginitis 07/10/2016   Candida vaginitis 07/24/2016   Headache in pregnancy, antepartum, third trimester 07/24/2016   Kell isoimmunization during pregnancy    Past Surgical History:  Past Surgical History:  Procedure Laterality Date   CESAREAN SECTION N/A 09/04/2016   Procedure: CESAREAN SECTION;  Surgeon: Levie Heritage, DO;  Location: [facility];  Service: Obstetrics;  Laterality: N/A;   CESAREAN SECTION Bilateral    IR CT HEAD LTD  02/21/2023   IR GASTROSTOMY TUBE MOD SED  03/21/2023   IR PERCUTANEOUS ART THROMBECTOMY/INFUSION INTRACRANIAL INC DIAG ANGIO  02/21/2023   RADIOLOGY WITH ANESTHESIA N/A 02/21/2023   Procedure: IR WITH ANESTHESIA;  Surgeon: Julieanne Cotton, MD;  Location: [facility] OR;  Service: Radiology;  Laterality: N/A;    Assessment / Plan / Recommendation Clinical Impression  Jacqueline [name] is a 23 year old right-handed female with medical history significant for Kells isoimmunization pregnancy, unprovoked PE with positive lupus anticoagulant for which she stopped Eliquis in June 2024, hemoglobin C trait, anemia iron deficient thrombocytopenia, tobacco use. Per chart review patient lives with her 2 children ages 73 and 57. She has a boyfriend mother and sister with good support. 1 level home with 1 flight of stairs to entry. Reportedly independent prior to admission. Presented 02/21/2023 with  expressive aphasia and right side weakness. Per chart patient admitted to drinking a significant amount of alcohol the night prior and did take Suboxone that has not been prescribed to her. Chemistries unremarkable except hemoglobin 8.4, alcohol of 14, urine drug screen negative. Cranial CT scan showed mild loss of gray-Mcclenton differentiation left parietal temporal region representing left MCA territory acute infarct versus artifact. No acute hemorrhage. CTA showed occluded left proximal to mid M2 MCA. MRI follow-up showed fairly extensive acute left MCA territory and watershed territory infarcts within the left cerebral hemisphere. Small acute right frontal parietal cortical/subcortical infarcts also present. Underwent left common carotid arteriogram per interventional radiology with attempted mechanical thrombectomy which unfortunately was unsuccessful Dr.Deveshwar.  A repeat MRI was completed for stroke follow-up 02/27/2023 showing increased acute infarcts in left posterior frontal and parietal lobes, anterior left frontal lobe and right frontal lobe, and additional punctate acute infarcts in left occipital, left frontal lobe and medial parietal lobe. Neurology continues to follow patient is currently maintained on Eliquis. Gastrostomy tube was placed 03/21/2023 for dysphagia per interventional radiology Dr. Loreta Ave. Her diet has since been advanced to a regular consistency. Therapy evaluations completed due to patient decreased functional mobility was admitted for a comprehensive rehab program. Pt was admitted to CIR on 03/26/23.  Cognitive Linguistic: Patient presents with severe expressive and receptive aphasia. Pt was unable to participate in formal assessment due to fatigue and poor frustration tolerance. Informal assessment revealed some automatic speech as pt responded "good" when asked how she was. Pt inconsistently responded to closed ended questions appropriately stating, "no" consistently when asked  about eating. Pt able to repeat single words c/b perseverating on "phone" after SLP mentioned moving  it. SLP present during nursing care and observed pt communicating incontinence and need for care by pointing toward brief. During incontinence care, pt followed simple 1 step directions by initiating bed mobility when asked. Pt initially presented with right gaze preference however demo tracking as SLP moved to left side of bed.  NSG staff reports anticipating pt needs in lieu of observing functional communication. Pt also demonstrated ability to locate call button. SLP recommending focus on language through establishing communication system, addressing consistency of yes/ no questions, following directions, and automatic speech. Cognition was dta due to severity of language deficits as well as time constraints due to onset of somnolence. SLP recommending further cognitive assessment as language improves.   Dysphagia: Patient vehemently declined PO intake of solids and liquids during session. Per nursing, pt had just received bolus feeds. Pt agreeable for oral hygiene with SLP subsequently setting up suction cannister and Yankauer. Upon completion pt observed with somnolence opening eyes briefly before closing again. SLP completed minimal oral hygiene via suction toothbrush with natural dentition observed. Pt also observed with bleeding from gums given minimal stimulation. NSG staff made aware. SLP recommending continuation of regular diet and thin liquid with standard swallow precautions. Pt would benefit from SLP observance of PO intake to ensure tolerance of current diet consistency.   Pt would benefit from skilled SLP services to maximize dysphagia, aphasia, and cognition in order to maximize her independence prior to discharge. Anticipate pt will require 24 hour supervision at home and f/u home health SLP services.     Skilled Therapeutic Interventions          OME, informal assessment measures, portions  of WAB administered. Please see full report for additional details.     SLP Assessment  Patient will need skilled Speech Lanaguage Pathology Services during CIR admission    Recommendations  SLP Diet Recommendations: Age appropriate regular solids;Thin Liquid Administration via: Cup;Straw Medication Administration: Crushed with puree Supervision: Full supervision/cueing for compensatory strategies Compensations: Minimize environmental distractions Postural Changes and/or Swallow Maneuvers: Seated upright 90 degrees Oral Care Recommendations: Oral care BID Patient destination: Home Follow up Recommendations: Home Health SLP;Outpatient SLP;24 hour supervision/assistance Equipment Recommended: None recommended by SLP    SLP Frequency 3 to 5 out of 7 days   SLP Duration  SLP Intensity  SLP Treatment/Interventions 3-4 weeks  Minumum of 1-2 x/day, 30 to 90 minutes  Dysphagia/aspiration precaution training;Internal/external aids;Cognitive remediation/compensation;Speech/Language facilitation;Cueing hierarchy;Environmental controls;Therapeutic Activities;Multimodal communication approach;Patient/family education;Functional tasks    Pain Pain Assessment Pain Scale: Faces Faces Pain Scale: No hurt Pain Type: Acute pain Pain Location: Abdomen Pain Orientation: Left Pain Descriptors / Indicators: Moaning;Grimacing Pain Intervention(s): RN made aware  Prior Functioning Cognitive/Linguistic Baseline: Within functional limits Type of Home: Apartment  Lives With: Significant other;Family Available Help at Discharge: Family;Available 24 hours/day (boyfriend, mom, sister. Pt has 2 minor children)  SLP Evaluation Cognition Overall Cognitive Status: Difficult to assess Arousal/Alertness: Awake/alert Orientation Level: Oriented to person Attention: Sustained Sustained Attention: Appears intact Behaviors: Poor frustration tolerance  Comprehension Auditory Comprehension Overall Auditory  Comprehension: Impaired Yes/No Questions: Impaired Basic Biographical Questions: 26-50% accurate Commands: Impaired One Step Basic Commands: 25-49% accurate Interfering Components: Pain EffectiveTechniques: Extra processing time;Repetition Visual Recognition/Discrimination Discrimination: Not tested Reading Comprehension Reading Status: Not tested Expression Expression Primary Mode of Expression: Verbal Verbal Expression Overall Verbal Expression: Impaired Initiation: Impaired Automatic Speech: Social Response Level of Generative/Spontaneous Verbalization: Word Repetition: Impaired Level of Impairment: Word level Naming: Impairment Confrontation: Impaired Verbal Errors: Semantic paraphasias;Neologisms (inconsistently aware  of errors) Non-Verbal Means of Communication: Gestures Written Expression Dominant Hand: Right Oral Motor Oral Motor/Sensory Function Overall Oral Motor/Sensory Function: Mild impairment  Care Tool Care Tool Cognition Ability to hear (with hearing aid or hearing appliances if normally used Ability to hear (with hearing aid or hearing appliances if normally used): 0. Adequate - no difficulty in normal conservation, social interaction, listening to TV   Expression of Ideas and Wants Expression of Ideas and Wants: 1. Rarely/Never expressess or very difficult - rarely/never expresses self or speech is very difficult to understand   Understanding Verbal and Non-Verbal Content Understanding Verbal and Non-Verbal Content: 2. Sometimes understands - understands only basic conversations or simple, direct phrases. Frequently requires cues to understand  Memory/Recall Ability Memory/Recall Ability : That he or she is in a hospital/hospital unit   Bedside Swallowing Assessment General Temperature Spikes Noted: No Respiratory Status: Room air History of Recent Intubation: No Behavior/Cognition: Alert;Requires cueing Oral Cavity - Dentition: Adequate natural  dentition (bleeding noted while brushing) Self-Feeding Abilities: Total assist Patient Positioning: Partially reclined Baseline Vocal Quality: Normal Volitional Swallow: Unable to elicit  Oral Care Assessment Oral Assessment  (WDL): Within Defined Limits Lips: Symmetrical Teeth: Intact (bleeding noted while brushing) Tongue: Pink;Moist Mucous Membrane(s): Moist;Pink Level of Consciousness: Alert Is patient on any of following O2 devices?: None of the above Nutritional status: No high risk factors Oral Assessment Risk : High Risk Ice Chips Ice chips: Not tested Thin Liquid Thin Liquid: Not tested Nectar Thick Nectar Thick Liquid: Not tested Honey Thick Honey Thick Liquid: Not tested Puree Puree: Not tested Solid Solid: Not tested BSE Assessment Risk for Aspiration Impact on safety and function: Mild aspiration risk Other Related Risk Factors: Lethargy  Short Term Goals: Week 1: SLP Short Term Goal 1 (Week 1): Patient will complete automatic speech task with 70% acc given max multimodal cues SLP Short Term Goal 2 (Week 1): Patient will follow one step directions with 50% acc given mod multimodal cues SLP Short Term Goal 3 (Week 1): Pt will verbalize basic biographical/ personal information with 50% acc and max multimodal  A SLP Short Term Goal 4 (Week 1): Patient will answer simple to complex yes/ no questions with 70% acc given mod multimodal cues SLP Short Term Goal 5 (Week 1): Patient will consume regular solids with adequate bolus formation and no s/sx asp in >90% opportunities with sup A  Refer to Care Plan for Long Term Goals  Recommendations for other services: Neuropsych  Discharge Criteria: Patient will be discharged from SLP if patient refuses treatment 3 consecutive times without medical reason, if treatment goals not met, if there is a change in medical status, if patient makes no progress towards goals or if patient is discharged from hospital.  The above  assessment, treatment plan, treatment alternatives and goals were discussed and mutually agreed upon: by patient  Renaee Munda 03/26/2023, 12:13 PM

## 2023-03-26 NOTE — Plan of Care (Signed)
Problem: RH Balance Goal: LTG: Patient will maintain dynamic sitting balance (OT) Description: LTG:  Patient will maintain dynamic sitting balance with assistance during activities of daily living (OT) Flowsheets (Taken 03/26/2023 1418) LTG: Pt will maintain dynamic sitting balance during ADLs with: Contact Guard/Touching assist Goal: LTG Patient will maintain dynamic standing with ADLs (OT) Description: LTG:  Patient will maintain dynamic standing balance with assist during activities of daily living (OT)  Flowsheets (Taken 03/26/2023 1418) LTG: Pt will maintain dynamic standing balance during ADLs with: Minimal Assistance - Patient > 75%   Problem: Sit to Stand Goal: LTG:  Patient will perform sit to stand in prep for activites of daily living with assistance level (OT) Description: LTG:  Patient will perform sit to stand in prep for activites of daily living with assistance level (OT) Flowsheets (Taken 03/26/2023 1418) LTG: PT will perform sit to stand in prep for activites of daily living with assistance level: Minimal Assistance - Patient > 75%   Problem: RH Eating Goal: LTG Patient will perform eating w/assist, cues/equip (OT) Description: LTG: Patient will perform eating with assist, with/without cues using equipment (OT) Flowsheets (Taken 03/26/2023 1418) LTG: Pt will perform eating with assistance level of: Set up assist    Problem: RH Grooming Goal: LTG Patient will perform grooming w/assist,cues/equip (OT) Description: LTG: Patient will perform grooming with assist, with/without cues using equipment (OT) Flowsheets (Taken 03/26/2023 1418) LTG: Pt will perform grooming with assistance level of: Set up assist    Problem: RH Bathing Goal: LTG Patient will bathe all body parts with assist levels (OT) Description: LTG: Patient will bathe all body parts with assist levels (OT) Flowsheets (Taken 03/26/2023 1418) LTG: Pt will perform bathing with assistance level/cueing: Minimal  Assistance - Patient > 75%   Problem: RH Dressing Goal: LTG Patient will perform upper body dressing (OT) Description: LTG Patient will perform upper body dressing with assist, with/without cues (OT). Flowsheets (Taken 03/26/2023 1418) LTG: Pt will perform upper body dressing with assistance level of: Minimal Assistance - Patient > 75% Goal: LTG Patient will perform lower body dressing w/assist (OT) Description: LTG: Patient will perform lower body dressing with assist, with/without cues in positioning using equipment (OT) Flowsheets (Taken 03/26/2023 1418) LTG: Pt will perform lower body dressing with assistance level of: Minimal Assistance - Patient > 75%   Problem: RH Toileting Goal: LTG Patient will perform toileting task (3/3 steps) with assistance level (OT) Description: LTG: Patient will perform toileting task (3/3 steps) with assistance level (OT)  Flowsheets (Taken 03/26/2023 1418) LTG: Pt will perform toileting task (3/3 steps) with assistance level: Minimal Assistance - Patient > 75%   Problem: RH Functional Use of Upper Extremity Goal: LTG Patient will use RT/LT upper extremity as a (OT) Description: LTG: Patient will use right/left upper extremity as a stabilizer/gross assist/diminished/nondominant/dominant level with assist, with/without cues during functional activity (OT) Flowsheets (Taken 03/26/2023 1418) LTG: Use of upper extremity in functional activities: RUE as a stabilizer LTG: Pt will use upper extremity in functional activity with assistance level of: Minimal Assistance - Patient > 75%   Problem: RH Toilet Transfers Goal: LTG Patient will perform toilet transfers w/assist (OT) Description: LTG: Patient will perform toilet transfers with assist, with/without cues using equipment (OT) Flowsheets (Taken 03/26/2023 1418) LTG: Pt will perform toilet transfers with assistance level of: Minimal Assistance - Patient > 75%   Problem: RH Tub/Shower Transfers Goal: LTG Patient  will perform tub/shower transfers w/assist (OT) Description: LTG: Patient will perform tub/shower transfers with  assist, with/without cues using equipment (OT) Flowsheets (Taken 03/26/2023 1418) LTG: Pt will perform tub/shower stall transfers with assistance level of: Minimal Assistance - Patient > 75%

## 2023-03-26 NOTE — Progress Notes (Signed)
Inpatient Rehabilitation Admissions Coordinator   I spoke with patient's Mom by phone and her choices for visitors are 1. Omar, 2. Aunt Jeananne Rama and 3. TBD of one of her sisters.   Ottie Glazier, RN, MSN Rehab Admissions Coordinator [phone] 03/26/2023 3:27 PM

## 2023-03-26 NOTE — Progress Notes (Signed)
   IR Note  Discussed with Dr Grace Isaac  I have ordered CT Abd with IV Cx only  Await result and recommendation from there.

## 2023-03-26 NOTE — Progress Notes (Signed)
Pt with confusion,moaning and crying unable to express needs. Appears to point to left abdominal side where PEG tube is.  PEG site clean/dry/intact. PRN pain meds given but unrelieved. Was up most of the night requesting frequent attention.  Jacqueline Severin   RN

## 2023-03-26 NOTE — Plan of Care (Signed)
  Problem: RH Swallowing Goal: LTG Patient will consume least restrictive diet using compensatory strategies with assistance (SLP) Description: LTG:  Patient will consume least restrictive diet using compensatory strategies with assistance (SLP) Flowsheets (Taken 03/26/2023 1223) LTG: Pt Patient will consume least restrictive diet using compensatory strategies with assistance of (SLP): Modified Independent   Problem: RH Comprehension Communication Goal: LTG Patient will comprehend basic/complex auditory (SLP) Description: LTG: Patient will comprehend basic/complex auditory information with cues (SLP). Flowsheets (Taken 03/26/2023 1223) LTG: Patient will comprehend: Basic auditory information LTG: Patient will comprehend auditory information with cueing (SLP): Moderate Assistance - Patient 50 - 74%   Problem: RH Expression Communication Goal: LTG Patient will express needs/wants via multi-modal(SLP) Description: LTG:  Patient will express needs/wants via multi-modal communication (gestures/written, etc) with cues (SLP) Flowsheets (Taken 03/26/2023 1223) LTG: Patient will express needs/wants via multimodal communication (gestures/written, etc) with cueing (SLP): Moderate Assistance - Patient 50 - 74% Goal: LTG Patient will verbally express basic/complex needs(SLP) Description: LTG:  Patient will verbally express basic/complex needs, wants or ideas with cues  (SLP) Flowsheets (Taken 03/26/2023 1223) LTG: Patient will verbally express basic/complex needs, wants or ideas (SLP): Maximal Assistance - Patient 25 - 49%   Problem: RH Awareness Goal: LTG: Patient will demonstrate awareness during functional activites type of (SLP) Description: LTG: Patient will demonstrate awareness during functional activites type of (SLP) Flowsheets (Taken 03/26/2023 1223) Patient will demonstrate during cognitive/linguistic activities awareness type of: Emergent LTG: Patient will demonstrate awareness during  cognitive/linguistic activities with assistance of (SLP): Maximal Assistance - Patient 25 - 49%

## 2023-03-26 NOTE — Consult Note (Addendum)
Initial Consultation Note   Patient: Jacqueline [name] DOB: October 19, 2000 PCP: [name], MD DOA: 03/25/2023 DOS: the patient was seen and examined on 03/26/2023 Primary service: Horton Chin, MD  Referring physician: Fanny Dance, MD Reason for consult: Medical management  Assessment/Plan: Assessment and Plan:  Acute ischemic stroke Patient presents after having acute onset of aphasia back on 02/21/23.  Found to have occluded left proximal M2 MCA and MRI noting a left MCA territory infarct.  Patient underwent thrombectomy which initially seemed to be successful but then had worsening stenosis and subsequent complete closure of the left and 2.  Repeat MRI showing extension of the stroke.  EEG noted severe encephalopathy with no seizure activity appreciated.  Stroke was thought secondary to a hypercoagulable state due to known lupus anticoagulant off of anticoagulation since 09/2022.  Patient had been transition from heparin back to Eliquis.   SIRS Patient was noted to be febrile up to 101.4 F with tachycardia meeting SIRS criteria.  Blood cultures had been obtained.  Influenza, COVID-19, and RSV on 1/18. -Follow-up blood cultures -Start empiric antibiotics of Rocephin and metronidazole -Tylenol as needed for fever  Ileus Acute.  Abdominal x-ray noting gaseous distention of the small bowel and colon with prominent stool in the right colon and rectum for which features were compatible with a possible ileus.  Last bowel movement thought to possibly be on 1/18. -N.p.o. -Monitor intake and output -Give smog enema -Resume diet and tube feeds once medically appropriate  Pain around PEG tube site Question possible infection Patient noted to have pain with purulent drainage around PEG tube.  IR has been consulted and ordered CT scan of the abdomen pelvis.  Tube feeds currently on hold. -Follow-up CT scan of the abdomen and pelvis -Appreciate IR consultative  services  Hyponatremia Acute.  Sodium noted to be low at 129.  Patient had been on tube feeds.  Normal saline IV fluids started yesterday evening.  Patient had been started on normal saline IV fluids at 100 mL/h yesterday evening -Check urine sodium and urine osmolarity -Recheck BMP and monitor daily -Continue normal saline IV fluids at 100 mL/h  Transaminitis Patient noted to have mild elevation in AST 44 and ALT 58.  Possibly reactive in nature. -Continue to monitor  Lupus anticoagulant History of pulmonary embolism -Continue Eliquis  Iron deficiency anemia Thrombocytopenia Hemoglobin and platelet count appears stable at 11.1 and 88 respectively. -Continue to monitor TRH will continue to follow the patient.  HPI: Jacqueline [name] is a 23 y.o. female with past medical history of thrombocytopenia, postpartum hemorrhage, iron deficiency anemia, thrombocytopenia, PE due to lupus anticoagulant who had not been on anticoagulation since June 2024, and tobacco abuse initially presented 02/21/2023 with expressive aphasia   Patient was found to have loss of gray-Milke differentiation in the left parietal region representing the left MCA territory acute infarct versus artifact.  CTA noted occluded proximal to mid M2 MCA.  MRI subsequently noted extensive acute left MCA territory watershed territory infarcts within the left cerebral hemisphere.  Patient was taken for thrombectomy due to left M2 occlusion.  Patient was emergently taken for thrombectomy which seemed initially successful, but then had worsening stenosis and subsequent complete closure of the left M2.  Repeat MRI noted extension of the stroke.  No seizure activity was noted on EEG.  Patient was transition back to Eliquis and had PEG tube placed.  She was placed on tube feeds, but also allowed to eat orally.  Labs significant  for WBC 12.8, hemoglobin 11.1, platelets 88, sodium 129, AST 44, ALT 58 procalcitonin<0.1, lactic acid 1.2 patient was  noted to have fevers up to 101.4 F with tachycardia meeting SIRS criteria.  Chest x-ray showed no acute abnormality.  Abdominal x-ray noted diffuse gaseous distention of the small bowel and colon with prominent stool in the right colon and rectum concerning for possible ileus.  Recent urinalysis that showed no signs for infection from 1/18.   Review of Systems: unable to review all systems due to the inability of the patient to answer questions. Past Medical History:  Diagnosis Date   Bacterial vaginitis 07/10/2016   Candida vaginitis 07/24/2016   Headache in pregnancy, antepartum, third trimester 07/24/2016   Kell isoimmunization during pregnancy    Past Surgical History:  Procedure Laterality Date   CESAREAN SECTION N/A 09/04/2016   Procedure: CESAREAN SECTION;  Surgeon: Levie Heritage, DO;  Location: [facility];  Service: Obstetrics;  Laterality: N/A;   CESAREAN SECTION Bilateral    IR CT HEAD LTD  02/21/2023   IR GASTROSTOMY TUBE MOD SED  03/21/2023   IR PERCUTANEOUS ART THROMBECTOMY/INFUSION INTRACRANIAL INC DIAG ANGIO  02/21/2023   RADIOLOGY WITH ANESTHESIA N/A 02/21/2023   Procedure: IR WITH ANESTHESIA;  Surgeon: Julieanne Cotton, MD;  Location: [facility];  Service: Radiology;  Laterality: N/A;   Social History:  reports that she has been smoking cigars. She has never used smokeless tobacco. She reports current alcohol use. She reports that she does not use drugs.  No Known Allergies  Family History  Problem Relation Age of Onset   Hypertension Maternal Grandmother    Diabetes Maternal Grandmother    Cancer Neg Hx     Prior to Admission medications   Medication Sig Start Date End Date Taking? Authorizing Provider  acetaminophen (TYLENOL) 500 MG tablet Take 500 mg by mouth every 6 (six) hours as needed for moderate pain (pain score 4-6).    [provider]  apixaban (ELIQUIS) 5 MG TABS tablet Take apixaban 10 mg (2 tabs) twice daily until [facility] Feb 14 then reduce  to apixaban 5 mg (1 tab) twice daily Patient not taking: Reported on 02/22/2023 04/14/22   Alberteen Sam, MD  clonazePAM (KLONOPIN) 0.5 MG tablet Place 1 tablet (0.5 mg total) into feeding tube at bedtime. 03/25/23   Dorcas Carrow, MD  cyanocobalamin 2000 MCG tablet Place 1 tablet (2,000 mcg total) into feeding tube daily. 03/26/23   Dorcas Carrow, MD  folic acid (FOLVITE) 1 MG tablet Place 1 tablet (1 mg total) into feeding tube daily. 03/26/23   Dorcas Carrow, MD  gabapentin (NEURONTIN) 100 MG capsule Take 1 capsule (100 mg total) by mouth 3 (three) times daily. 03/25/23   Dorcas Carrow, MD  iron polysaccharides (NIFEREX) 150 MG capsule Place 1 capsule (150 mg total) into feeding tube daily. 03/26/23   Dorcas Carrow, MD  Nutritional Supplements (FEEDING SUPPLEMENT, OSMOLITE 1.5 CAL,) LIQD Place 355 mLs into feeding tube 4 (four) times daily. 03/25/23   Dorcas Carrow, MD  oxyCODONE (ROXICODONE) 5 MG/5ML solution Take 5 mLs (5 mg total) by mouth every 6 (six) hours as needed (perceived pain). 03/25/23   Dorcas Carrow, MD  polyethylene glycol (MIRALAX / GLYCOLAX) 17 g packet Place 17 g into feeding tube daily. 03/26/23   Dorcas Carrow, MD  topiramate (TOPAMAX) 25 MG tablet Take 1 tablet (25 mg total) by mouth 2 (two) times daily. 03/25/23   Dorcas Carrow, MD  Water For Irrigation, Sterile (FREE  WATER) SOLN Place 150 mLs into feeding tube 4 (four) times daily. 03/25/23   Dorcas Carrow, MD    Physical Exam: Vitals:   03/26/23 0147 03/26/23 0430 03/26/23 0832 03/26/23 0840  BP: 121/78 116/76 117/76 121/72  Pulse: (!) 123 (!) 110 (!) 119 (!) 117  Resp: 18 18 17 20   Temp: (!) 100.4 F (38 C) 100.1 F (37.8 C) 98.3 F (36.8 C) 98.3 F (36.8 C)  TempSrc: Oral Oral Axillary Oral  SpO2: 98%  100% 98%  Weight:      Height:        Constitutional: Young female currently no acute distress Eyes: PERRL, lids and conjunctivae normal ENMT: Mucous membranes are moist. Posterior pharynx clear of  any exudate or lesions.Normal dentition.  Neck: normal, supple Respiratory: clear to auscultation bilaterally, no wheezing, no crackles. Normal respiratory effort. No accessory muscle use.  Cardiovascular: Regular rate and rhythm, no murmurs / rubs / gallops. Abdomen: PEG tube in place.  Tenderness palpation surrounding PEG with redness appreciated. Musculoskeletal: no clubbing / cyanosis. No joint deformity upper and lower extremities.  Skin: no rashes, lesions, ulcers. No induration Neurologic: CN 2-12 grossly intact.  Expressive aphasia. Psychiatric: Alert.  Unable to assess orientation due to aphasia.  Data Reviewed:   reviewed labs, imaging, and pertinent records as documented.   Family Communication:  Primary team communication:  Thank you very much for involving [place] in the care of your patient.  Author: Clydie Braun, MD 03/26/2023 11:01 AM  For on call review [email or web address].

## 2023-03-26 NOTE — Progress Notes (Signed)
Called earlier on patient who was agitated and yelling past admission. HR was elevated and tylenol advised at first line for pain. Repeat vitals showed BP 94/60 --500 cc bolus given prior to giving oxycodone for pain. Received call back that HR elevated again to 120's. Chart evaluated and note patient's temps 99- 99.5 F. Last check of labs on 1/18 showing WBC 13.8 elevated with drop platelets to 77.  Also note to have hyponatremia with negative sepsis work up.  With elevated HR and upward trend in fevers; will check follow up labs. CXR to rule out aspiration as well as KUB due to reports of abdominal pain.

## 2023-03-26 NOTE — Progress Notes (Signed)
PROGRESS NOTE   Subjective/Complaints: Patient reported to be agitated last night.  Patient given IV fluids.  KUB and CXR was ordered-appears to have ileus.  ROS: Limited by aphasia   Objective:   DG Abd Portable 1V Result Date: 03/26/2023 CLINICAL DATA:  Abdominal pain. EXAM: PORTABLE ABDOMEN - 1 VIEW COMPARISON:  03/21/2023 FINDINGS: Percutaneous gastrostomy tube overlies the medial left abdomen. There is diffuse gaseous distension of small bowel and colon with prominent stool visible in the right colon and rectum. Visualized bony anatomy unremarkable. IMPRESSION: Diffuse gaseous distension of small bowel and colon with prominent stool in the right colon and rectum. Imaging features are most compatible with ileus. Electronically Signed   By: Kennith Center M.D.   On: 03/26/2023 09:31   DG Chest Port 1V same Day Result Date: 03/26/2023 CLINICAL DATA:  Fever. EXAM: PORTABLE CHEST 1 VIEW COMPARISON:  None Available. FINDINGS: The lungs are clear without focal pneumonia, edema, pneumothorax or pleural effusion. Cardiopericardial silhouette is at upper limits of normal for size. No acute bony abnormality. IMPRESSION: No active disease. Electronically Signed   By: Kennith Center M.D.   On: 03/26/2023 08:49   Recent Labs    03/26/23 0210  WBC 12.8*  HGB 11.1*  HCT 31.6*  PLT 88*   Recent Labs    03/26/23 0210  NA 129*  K 3.7  CL 99  CO2 21*  GLUCOSE 114*  BUN 13  CREATININE 0.73  CALCIUM 8.7*   No intake or output data in the 24 hours ending 03/26/23 1303      Physical Exam: Vital Signs Blood pressure 119/76, pulse (!) 122, temperature (!) 101.4 F (38.6 C), temperature source Oral, resp. rate 20, height 5\' 4"  (1.626 m), weight 77.1 kg, SpO2 100%, unknown if currently breastfeeding.   General: Sleeping when I first came in.  No acute distress but appears uncomfortable. HEENT: Head is normocephalic, atraumatic, mucous  membranes moist Neck: Supple without JVD or lymphadenopathy Heart: Reg rate and rhythm. No murmurs rubs or gallops Chest: CTA bilaterally without wheezes, rales, or rhonchi; no distress Abdomen: Positive bowel sounds, abdomen is soft, positive abdominal tenderness around G-tube.  Urostomy tube in place. Extremities: No clubbing, cyanosis, or edema. Pulses are 2+ Psych: Appears mildly agitated Skin: Clean and intact without signs of breakdown Musculoskeletal:     Cervical back: Normal range of motion and neck supple.     Comments: LUE/LLE moving well again gravity- couldn't complete testing due to perseveration RUE- no voluntary movement seen of RUE or RLE even to tactile stimuli  Skin:    General: Skin is warm and dry.     Comments: Odor of yeast in R hand   Neurological:     Comments: Sleeping when I first came in but wakes to voice..  She does make eye contact with examiner.  Patient is aphasic but does have some spontaneous simple word responses.  She was able to say"OK" and "You Can" MAS of 3-4 in R shoulder esp External rotation; MAS of 4 in R elbow- lacking 45 degrees of extension with a lot of work; MAS of 2-3 of R hand- in fist at rest, but could  completely open fingers and get past wrist neutral.  Difficult to assess sensation      Assessment/Plan: 1. Functional deficits which require 3+ hours per day of interdisciplinary therapy in a comprehensive inpatient rehab setting. Physiatrist is providing close team supervision and 24 hour management of active medical problems listed below. Physiatrist and rehab team continue to assess barriers to discharge/monitor patient progress toward functional and medical goals  Care Tool:  Bathing    Body parts bathed by patient: Right arm, Chest, Abdomen, Front perineal area   Body parts bathed by helper: Left arm, Buttocks, Right upper leg, Left upper leg, Right lower leg, Left lower leg, Face     Bathing assist Assist Level: Maximal  Assistance - Patient 24 - 49%     Upper Body Dressing/Undressing Upper body dressing   What is the patient wearing?: Pull over shirt    Upper body assist Assist Level: Maximal Assistance - Patient 25 - 49%    Lower Body Dressing/Undressing Lower body dressing      What is the patient wearing?: Incontinence brief, Pants     Lower body assist Assist for lower body dressing: Total Assistance - Patient < 25%     Toileting Toileting    Toileting assist Assist for toileting: Dependent - Patient 0%     Transfers Chair/bed transfer  Transfers assist           Locomotion Ambulation   Ambulation assist              Walk 10 feet activity   Assist           Walk 50 feet activity   Assist           Walk 150 feet activity   Assist           Walk 10 feet on uneven surface  activity   Assist           Wheelchair     Assist               Wheelchair 50 feet with 2 turns activity    Assist            Wheelchair 150 feet activity     Assist          Blood pressure 119/76, pulse (!) 122, temperature (!) 101.4 F (38.6 C), temperature source Oral, resp. rate 20, height 5\' 4"  (1.626 m), weight 77.1 kg, SpO2 100%, unknown if currently breastfeeding.   Medical Problem List and Plan: 1. 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             -patient may  shower- cover PEG             -ELOS/Goals: 3-4 weeks mod to max A             Admit to CIR, evals today  -Team conference today please see physician documentation under team conference tab, met with team  to discuss problems,progress, and goals. Formulized individual treatment plan based on medical history, underlying problem and comorbidities.   2.  Antithrombotics: -DVT/anticoagulation:  Pharmaceutical: Eliquis             -antiplatelet therapy: N/A 3. Pain Management: Neurontin  100 mg 3 times daily, Topamax 25 mg twice daily, oxycodone 5 mg every 6 hours as needed 4. Mood/Behavior/Sleep: Klonopin 0.5 mg nightly, melatonin 5 mg nightly as needed             -  antipsychotic agents: suggest Depakote or increase in gabapentin to help calm pt down;  5. Neuropsych/cognition: This patient is not capable of making decisions on her own behalf. 6. Skin/Wound Care: routine skin care for PEG and overall skin 7. Fluids/Electrolytes/Nutrition: will assess labs and recheck labs in AM 8.  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  Followed by oncology/hematology  9.  Anemia/thrombocytopenia/iron deficiency.  Follow-up CBC.  Continue Niferex  -Hemoglobin stable 11.1, platelets 88K 10.  Dysphagia.  Status post gastrostomy tube 03/21/2023 per interventional radiology.  Diet has been advanced to regular.  Dietary follow-up- still on TF's because not eating well.  11.  E. coli UTI.  Antibiotic therapy completed 12.  Tobacco/alcohol use.  Counseling 13. Severe RUE spasticity- would strongly suggest Botox of RUE soon as well as resting hand splint for R wrist and R PRAFO- will order.  14. Abd pain- from PEG vs constipation- if no BM, might need KUB.   -IR consult to assess Gtube site.  Pus noted at skin site. Appears CT scan is ordered  15.  Elevated transaminases-mild  -Continue to monitor 16.  Hyponatremia  -Currently on IVF, hospitalist consulted  -Will DC free water per tube  17.  Ileus  -Hospitalist consulted, continue IVF, discontinued tube feeds      LOS: 1 days A FACE TO FACE EVALUATION WAS PERFORMED  Fanny Dance 03/26/2023, 1:03 PM

## 2023-03-26 NOTE — Discharge Instructions (Addendum)
 Inpatient Rehab Discharge Instructions  Jacqueline [name] Discharge date and time: No discharge date for patient encounter.   Activities/Precautions/ Functional Status: Activity: As tolerated Diet: regular diet Wound Care: Routine skin checks Functional status:  ___ No restrictions     ___ Walk up steps independently ___ 24/7 supervision/assistance   ___ Walk up steps with assistance ___ Intermittent supervision/assistance  ___ Bathe/dress independently ___ Walk with walker     _x__ Bathe/dress with assistance ___ Walk Independently    ___ Shower independently ___ Walk with assistance    ___ Shower with assistance ___ No alcohol     ___ Return to work/school ________  Special Instructions: No driving smoking or alcohol    COMMUNITY REFERRALS UPON DISCHARGE:    Home Health:   PT     SP    OT   RN                  Agency: [facility] [phone]   Medical Equipment/Items Ordered:WHEELCHAIR-NU MOTION   [phone]                                                  [place] [address] [facility]   [phone]  PCS REFERRAL MADE WILL FOLLOW UP WITH SISTER REGARDING AGENCY PREFERENCE AND NUMBER OF HOURS CAN RECEIVE TRANSPORTATION COVERED BY MEDICAID-[employer] [phone]  My questions have been answered and I understand these instructions. I will adhere to these goals and the provided educational materials after my discharge from the hospital.  Patient/Caregiver Signature _______________________________ Date __________  Clinician Signature _______________________________________ Date __________  Please bring this form and your medication list with you to all your follow-up doctor's appointments.   Information on my medicine - ELIQUIS (apixaban)  This medication education was reviewed with me or my healthcare representative as part of my discharge preparation.  The pharmacist that spoke with me during my hospital stay was:  Ulyses Southward,  RPH-CPP  Why was Eliquis prescribed for you? Eliquis was prescribed to treat blood clots that may have been found in the veins of your legs (deep vein thrombosis) or in your lungs (pulmonary embolism) and to reduce the risk of them occurring again.  What do You need to know about Eliquis ? Continue Eliquis 5 mg tablet taken TWICE daily.  Eliquis may be taken with or without food.   Try to take the dose about the same time in the morning and in the evening. If you have difficulty swallowing the tablet whole please discuss with your pharmacist how to take the medication safely.  Take Eliquis exactly as prescribed and DO NOT stop taking Eliquis without talking to the doctor who prescribed the medication.  Stopping may increase your risk of developing a new blood clot.  Refill your prescription before you run out.  After discharge, you should have regular check-up appointments with your healthcare provider that is prescribing your Eliquis.    What do you do if you miss a dose? If a dose of ELIQUIS is not taken at the scheduled time, take it as soon as possible on the same day and twice-daily administration should be resumed. The dose should not be doubled to make up for a missed dose.  Important Safety Information A possible side effect of Eliquis is bleeding. You should call your healthcare provider right away  if you experience any of the following: Bleeding from an injury or your nose that does not stop. Unusual colored urine (red or dark brown) or unusual colored stools (red or black). Unusual bruising for unknown reasons. A serious fall or if you hit your head (even if there is no bleeding).  Some medicines may interact with Eliquis and might increase your risk of bleeding or clotting while on Eliquis. To help avoid this, consult your healthcare provider or pharmacist prior to using any new prescription or non-prescription medications, including herbals, vitamins, non-steroidal  anti-inflammatory drugs (NSAIDs) and supplements.  This website has more information on Eliquis (apixaban): http://www.eliquis.com/eliquis/home

## 2023-03-26 NOTE — Progress Notes (Signed)
Initial Nutrition Assessment  DOCUMENTATION CODES:   Not applicable  INTERVENTION:   TF via PEG:  Osmolite 1.5 - 355 ml  QID 60 ml ProSourceTF20 daily  75 ml free water before and after each bolus feeding  Mighty Shake TID with meals, each supplement provides 330 kcals and 9 grams of protein  D/C Liquid MVI  MVI with minerals daily via tube  NUTRITION DIAGNOSIS:   Inadequate oral intake related to lethargy/confusion as evidenced by meal completion < 50%.  GOAL:   Patient will meet greater than or equal to 90% of their needs  MONITOR:   PO intake, TF tolerance  REASON FOR ASSESSMENT:   Consult Enteral/tube feeding initiation and management  ASSESSMENT:   Pt with PMH of Kells isoimmunization pregnancy, unprovoked PE with positive lupus anticoagulant, hemoglobin C trait, anemia iron deficient thrombocytopenia, tobacco use, has two children ages 28 and 34.    Hospitalization:  12/20 - admitted with expressive aphasia and right sided weakness (previous night drank significant amount of ETOH and took Suboxone) dx with L MCA and watershed territory infarcts with failed attempt at mechanical thrombectomy.  12/26 - MRI showed increased infarcts.  1/17 - PEG placed 1/21 - admitted to rehab  Pt aphasic but does have some spontaneous simple word responses. Pt agitated.  Pt complaining of abd pain, KUB pending Noted constipation   Medications reviewed and include: Vitamin B12 2000 mcg daily, 1 mg folic acid daily, iron polysaccharides 150 mg daily, MVI liquid daily, miralax  Labs reviewed:  Na 129    Diet Order:   Diet Order             Diet regular Room service appropriate? No; Fluid consistency: Thin  Diet effective now                   EDUCATION NEEDS:   No education needs have been identified at this time  Skin:  Skin Assessment: Reviewed RN Assessment  Last BM:  1/18  Height:   Ht Readings from Last 1 Encounters:  03/25/23 5\' 4"  (1.626 m)     Weight:   Wt Readings from Last 1 Encounters:  03/25/23 77.1 kg    BMI:  Body mass index is 29.18 kg/m.  Estimated Nutritional Needs:   Kcal:  2000-2200  Protein:  100-115 grams  Fluid:  >2L/day  Cammy Copa., RD, LDN, CNSC See AMiON for contact information

## 2023-03-27 ENCOUNTER — Inpatient Hospital Stay (HOSPITAL_COMMUNITY): Payer: Medicaid Other

## 2023-03-27 ENCOUNTER — Encounter (HOSPITAL_COMMUNITY): Payer: Self-pay

## 2023-03-27 DIAGNOSIS — D72829 Elevated white blood cell count, unspecified: Secondary | ICD-10-CM

## 2023-03-27 DIAGNOSIS — R509 Fever, unspecified: Secondary | ICD-10-CM

## 2023-03-27 DIAGNOSIS — R7881 Bacteremia: Secondary | ICD-10-CM | POA: Insufficient documentation

## 2023-03-27 DIAGNOSIS — L089 Local infection of the skin and subcutaneous tissue, unspecified: Secondary | ICD-10-CM

## 2023-03-27 DIAGNOSIS — K9422 Gastrostomy infection: Secondary | ICD-10-CM

## 2023-03-27 HISTORY — PX: IR REPLC GASTRO/COLONIC TUBE PERCUT W/FLUORO: IMG2333

## 2023-03-27 LAB — CBC WITH DIFFERENTIAL/PLATELET
Abs Immature Granulocytes: 0.03 10*3/uL (ref 0.00–0.07)
Basophils Absolute: 0 10*3/uL (ref 0.0–0.1)
Basophils Relative: 0 %
Eosinophils Absolute: 0 10*3/uL (ref 0.0–0.5)
Eosinophils Relative: 0 %
HCT: 27.5 % — ABNORMAL LOW (ref 36.0–46.0)
Hemoglobin: 9.7 g/dL — ABNORMAL LOW (ref 12.0–15.0)
Immature Granulocytes: 0 %
Lymphocytes Relative: 13 %
Lymphs Abs: 1.3 10*3/uL (ref 0.7–4.0)
MCH: 27.9 pg (ref 26.0–34.0)
MCHC: 35.3 g/dL (ref 30.0–36.0)
MCV: 79 fL — ABNORMAL LOW (ref 80.0–100.0)
Monocytes Absolute: 0.8 10*3/uL (ref 0.1–1.0)
Monocytes Relative: 8 %
Neutro Abs: 7.8 10*3/uL — ABNORMAL HIGH (ref 1.7–7.7)
Neutrophils Relative %: 79 %
Platelets: 72 10*3/uL — ABNORMAL LOW (ref 150–400)
RBC: 3.48 MIL/uL — ABNORMAL LOW (ref 3.87–5.11)
RDW: 21.6 % — ABNORMAL HIGH (ref 11.5–15.5)
Smear Review: NORMAL
WBC: 9.9 10*3/uL (ref 4.0–10.5)
nRBC: 0 % (ref 0.0–0.2)

## 2023-03-27 LAB — CULTURE, BLOOD (ROUTINE X 2)
Culture: NO GROWTH
Culture: NO GROWTH

## 2023-03-27 LAB — BLOOD CULTURE ID PANEL (REFLEXED) - BCID2

## 2023-03-27 LAB — BASIC METABOLIC PANEL
Anion gap: 11 (ref 5–15)
BUN: 11 mg/dL (ref 6–20)
CO2: 18 mmol/L — ABNORMAL LOW (ref 22–32)
Calcium: 8.6 mg/dL — ABNORMAL LOW (ref 8.9–10.3)
Chloride: 105 mmol/L (ref 98–111)
Creatinine, Ser: 0.81 mg/dL (ref 0.44–1.00)
GFR, Estimated: >60 mL/min (ref >60)
Glucose, Bld: 92 mg/dL (ref 70–99)
Potassium: 3.3 mmol/L — ABNORMAL LOW (ref 3.5–5.1)
Sodium: 134 mmol/L — ABNORMAL LOW (ref 135–145)

## 2023-03-27 MED ORDER — FENTANYL CITRATE (PF) 100 MCG/2ML IJ SOLN
INTRAMUSCULAR | Status: AC
  Filled 2023-03-27: qty 4

## 2023-03-27 MED ORDER — POTASSIUM CHLORIDE 20 MEQ PO PACK
20.0000 meq | PACK | Freq: Once | ORAL | Status: AC
  Administered 2023-03-27: 20 meq
  Filled 2023-03-27: qty 1

## 2023-03-27 MED ORDER — LIDOCAINE-EPINEPHRINE 1 %-1:100000 IJ SOLN
INTRAMUSCULAR | Status: AC
  Filled 2023-03-27: qty 1

## 2023-03-27 MED ORDER — POTASSIUM CHLORIDE CRYS ER 20 MEQ PO TBCR: 20.0000 meq | EXTENDED_RELEASE_TABLET | Freq: Once | ORAL | Status: DC

## 2023-03-27 MED ORDER — VANCOMYCIN HCL IN DEXTROSE 1-5 GM/200ML-% IV SOLN
1000.0000 mg | Freq: Three times a day (TID) | INTRAVENOUS | Status: DC
  Administered 2023-03-27 – 2023-03-29 (×8): 1000 mg via INTRAVENOUS
  Filled 2023-03-27 (×9): qty 200

## 2023-03-27 MED ORDER — SMOG ENEMA
300.0000 mL | Freq: Once | RECTAL | Status: AC
  Administered 2023-03-27: 300 mL via RECTAL
  Filled 2023-03-27: qty 960

## 2023-03-27 MED ORDER — LORAZEPAM 2 MG/ML IJ SOLN: 0.5000 mg | Freq: Once | INTRAMUSCULAR | Status: DC | PRN

## 2023-03-27 MED ORDER — MIDAZOLAM HCL 2 MG/2ML IJ SOLN
INTRAMUSCULAR | Status: AC | PRN
  Administered 2023-03-27: .5 mg via INTRAVENOUS
  Administered 2023-03-27: 1 mg via INTRAVENOUS
  Administered 2023-03-27: .5 mg via INTRAVENOUS

## 2023-03-27 MED ORDER — LIDOCAINE VISCOUS HCL 2 % MT SOLN
OROMUCOSAL | Status: AC
  Filled 2023-03-27: qty 15

## 2023-03-27 MED ORDER — HYDROXYZINE HCL 25 MG PO TABS: 25.0000 mg | ORAL_TABLET | Freq: Three times a day (TID) | ORAL | Status: DC | PRN

## 2023-03-27 MED ORDER — MIDAZOLAM HCL 2 MG/2ML IJ SOLN
INTRAMUSCULAR | Status: AC
  Filled 2023-03-27: qty 4

## 2023-03-27 MED ORDER — FENTANYL CITRATE (PF) 100 MCG/2ML IJ SOLN
INTRAMUSCULAR | Status: AC | PRN
  Administered 2023-03-27 (×2): 25 ug via INTRAVENOUS
  Administered 2023-03-27: 50 ug via INTRAVENOUS

## 2023-03-27 MED ORDER — IOHEXOL 300 MG/ML  SOLN: 50.0000 mL | Freq: Once | INTRAMUSCULAR | Status: DC | PRN

## 2023-03-27 NOTE — Progress Notes (Signed)
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 Patient was taken for thrombectomy due to left M2 occlusion. Thrombectomy was successful, but then had worsening stenosis and subsequent complete closure of the left M2. Repeat MRI noted extension of the stroke.  Patient was transition back to Eliquis and had PEG tube placed. She was placed on tube feeds, but also allowed to eat orally. [facility] was consulted on 1/22 for medical management of her infection symptoms. Found to have staph epi bacteremia.  ID was consulted as well. Workup found that PEG tube was malpositioned which was replaced on 1/23.  Assessment and plan. Sepsis due to staph epi bacteremia. MRSE Patient had fever, leukocytosis meeting SIRS criteria. Blood cultures positive for staph epi.  MRSE. Abdominal pain reported by the patient with some irritation around the PEG tube site. PEG tube was exchanged. ID was consulted. Repeat cultures ordered although further workup is not recommended for now. Currently on IV vancomycin and ceftriaxone and Flagyl. Antibiotic management per ID.  Monitor.  PEG tube malposition. Patient reported abdominal pain.  With fever and leukocytosis further workup was performed including a CT scan which showed evidence of retraction into the anterior abdominal wall. IR was reconsulted and the patient was taken for replacement of new 20 French gastrostomy tube. Tube is ready for immediate use.  Ileus. Constipation. CT scan also shows evidence of possible ileus with gas in  the bowel. Also significant constipation seen as well. Will continue enema. Repeat x-ray tomorrow. Okay to initiate trickle tube feeds although ideally would hold the tube feeds for now.  Hyponatremia. Monitor for now.  Elevated LFT. Minimally elevated. For now we will monitor.  Thrombocytopenia pretension appears to be acute. Platelet count continues to trend down. Etiology not clear.  Possibly in the setting of infection.  Monitor for now. If the platelet count drops below 60,000 though continuation of Eliquis would be tricky and would recommend transition to heparin.  History of lupus anticoagulant. History of PE. Acute CVA. On anticoagulation with Eliquis. As above depending on her platelet count would recommend transition to heparin if the counts are less than 60,000.  Iron deficiency anemia. H&H currently relatively stable but there is a drop in the hemoglobin between yesterday and today due to likely dilution. So far no evidence of bleeding. Monitor.  Anxiety. Based on the setting of recent events. 1 dose of as needed Ativan ordered.  We will continue to follow the patient.  Discussed with CIR attending.  Currently comfortable keeping the patient in the CIR. if the patient's condition deteriorates further, she will require readmission to the hospital for close observation.  Subjective: Severely anxious.  Due to aphasia unable to answer questions clearly.  Reports pain.  But also unable to identify location of the pain.  Objective: Vitals:   03/27/23 1037 03/27/23 1247 03/27/23 1322 03/27/23 1630  BP: 112/83 127/79  (!) 111/57  Pulse: (!) 101 (!) 118 (!) 109 (!) 107  Resp: 20 (!) 24 19   Temp:  99.4 F (37.4 C)  98.6 F (37 C)  TempSrc:  Oral  SpO2: 100% 100%  98%  Weight:      Height:        General: in moderate distress, No Rash Cardiovascular: S1 and S2 Present, No Murmur Respiratory: Good respiratory effort, Bilateral Air entry present. No Crackles, No  wheezes Abdomen: Bowel Sound present, mild diffuse tenderness Extremities: No edema Neuro: Alert and oriented to self.  Anxious.  Aphasic.    Family Communication: Family was on the phone at the time of my interview.  Data Reviewed: Since last encounter, pertinent lab results CBC and BMP   . I have ordered test including CBC and BMP  . I have discussed pt's care plan and test results with CIR  .   Author: Lynden Oxford, MD  [facility] 03/27/2023  7:11 PM  To reach On-call, Look up on care teams to locate the [facility] team or provider name and reach out to them via secure chat or [email or web address] Between 7PM-7AM, please contact night-coverage. If you still have difficulty reaching the attending provider, please page the [facility] (Director on Call) for [facility] on amion for assistance.

## 2023-03-27 NOTE — Progress Notes (Signed)
AM medications given orally per order.

## 2023-03-27 NOTE — Progress Notes (Signed)
MEWS Progress Note  Patient Details Name: ADIEL ALPERT MRN: [id number] DOB: 26-Jul-2000 Today's Date: 03/27/2023   MEWS Flowsheet Documentation:  Assess: MEWS Score Temp: 99 F (37.2 C) BP: 107/70 MAP (mmHg): 81 Pulse Rate: (!) 108 Resp: 16 Level of Consciousness: Alert SpO2: 100 % O2 Device: Room Air Assess: MEWS Score MEWS Temp: 0 MEWS Systolic: 0 MEWS Pulse: 1 MEWS RR: 0 MEWS LOC: 0 MEWS Score: 1 MEWS Score Color: Green Assess: SIRS CRITERIA SIRS Temperature : 0 SIRS Respirations : 0 SIRS Pulse: 1 SIRS WBC: 0 SIRS Score Sum : 1 Assess: if the MEWS score is Yellow or Red Were vital signs accurate and taken at a resting state?: Yes Does the patient meet 2 or more of the SIRS criteria?: Yes Does the patient have a confirmed or suspected source of infection?: No MEWS guidelines implemented : No, previously yellow, continue vital signs every 4 hours   Patient responding well to fluid infusion of .9 100 ml per hour.       Quenton Fetter 03/27/2023, 3:55 AM

## 2023-03-27 NOTE — Patient Care Conference (Signed)
Inpatient RehabilitationTeam Conference and Plan of Care Update Date: 03/26/2023   Time: 11:51 AM    Patient Name: Jacqueline [name]      Medical Record Number: [id number]  Date of Birth: [birth year] Sex: Female         Room/Bed: 4W14C/4W14C-01 Payor Info: Payor: [place] MEDICAID PREPAID HEALTH PLAN / Plan: [place] MEDICAID [employer] / Product Type: *No Product type* /    Admit Date/Time:  03/25/2023  2:38 PM  Primary Diagnosis:  Left middle cerebral artery stroke [facility])  Hospital Problems: Principal Problem:   Left middle cerebral artery stroke (HCC) Active Problems:   Thrombocytopenia (HCC)   Iron deficiency anemia   Lupus anticoagulant disorder (HCC)   SIRS (systemic inflammatory response syndrome) (HCC)   Hyponatremia   Ileus (HCC)   Pain around PEG tube site, initial encounter   Transaminitis   History of pulmonary embolism    Expected Discharge Date: Expected Discharge Date:  (evals pending)  Team Members Present: Physician leading conference: Dr. Fanny Dance Social Worker Present: Dossie Der, LCSW Nurse Present: Chana Bode, RN;Karen Trilby Drummer, RN PT Present: Blima Rich, PT OT Present: Candee Furbish, OT SLP Present: Jeannie Done, SLP PPS Coordinator present : Fae Pippin, SLP     Current Status/Progress Goal Weekly Team Focus  Bowel/Bladder   Pt is currently incontinent B/B  LBM 03/22/23   Will regain B/B incontinence.   Toilet pt q3-4 hrs while awake    Swallow/Nutrition/ Hydration   eval pending           ADL's   Unable to tolerate any OOB mobility or ADLs due to extreme abdominal pain. Spent full time trying to address needs/communication with epxressive aphasia, and toileting needs via bed pan. Total A/Dependent for all ADLs due to pain,            Mobility               Communication   eval pending            Safety/Cognition/ Behavioral Observations  eval pending            Pain   pain to left  abdomen (PEG SITE) Unrelief by prn medication   Will be free from pain   Assessc pain qshift/prn    Skin   Skin is intact. (PEG SITE INCISION)   Will maintain skin intergrity with no infection/breakdown  Assess skin forbreakdown qshift/prn and provide education to prevent breakdown      Discharge Planning:  New evaluation-per acute pt to go to Mom's home with sister, boyfriend and family members to assist. Will need 24/7 care at DC   Team Discussion: Patient post Left MCA CVA with dysphagia; PEG and limited by incisional pain. Patient presents with perseveration, echolalia, flexor synergy and an ileus.  Patient on target to meet rehab goals: Evals pending; procedure pending  *See Care Plan and progress notes for long and short-term goals.   Revisions to Treatment Plan:  NPO except sips for meds   Teaching Needs: Safety, medication, skin care, transfers, toileting, PEG care/use, etc.   Current Barriers to Discharge: Decreased caregiver support, Home enviroment access/layout, and Nutritional means  Possible Resolutions to Barriers: Family education     Medical Summary Current Status: CVA, Ilius, Pain, Leukocytosis, hyponatremia  Barriers to Discharge: Medical stability;Inadequate Nutritional Intake;Electrolyte abnormality  Barriers to Discharge Comments: CVA, Ilius, Pain, Leukocytosis, hyponatremia Possible Resolutions to [employer] Focus: Hospitalist consult, IVF, call IR   Continued Need for  Acute Rehabilitation Level of Care: The patient requires daily medical management by a physician with specialized training in physical medicine and rehabilitation for the following reasons: Direction of a multidisciplinary physical rehabilitation program to maximize functional independence : Yes Medical management of patient stability for increased activity during participation in an intensive rehabilitation regime.: Yes Analysis of laboratory values and/or radiology reports with  any subsequent need for medication adjustment and/or medical intervention. : Yes   I attest that I was present, lead the team conference, and concur with the assessment and plan of the team.   Chana Bode B 03/27/2023, 1:52 PM

## 2023-03-27 NOTE — Procedures (Signed)
Interventional Radiology Procedure Note  Procedure: Gastrostomy tube replacement  Findings: Please refer to procedural dictation for full description. Removal of pull-through indwelling gastrostomy and replacement with 20 Fr balloon retention gastrostomy.  Complications: None immediate  Estimated Blood Loss: < 5 ml  Recommendations: Gastrostomy ready for use immediately, as needed.   Marliss Coots, MD

## 2023-03-27 NOTE — Progress Notes (Deleted)
PROGRESS NOTE   Subjective/Complaints: Patient is scheduled for image guided salvage/exchange for G-tube today by IR.  She is anxious about this procedure.  Transport came to take her for this procedure.  Hospitalist also came to see her this morning.   ROS: Limited by aphasia   Objective:   IR Replc Gastro/Colonic Tube Percut W/Fluoro Result Date: 03/27/2023 INDICATION: 23 year old female status post pull-through image guided gastrostomy tube placement on 03/20/2018 presenting with tube malfunction and pain in tree site with CT evidence of retraction into the anterior abdominal wall soft tissues. EXAM: FLUOROSCOPIC GUIDED REPLACEMENT OF GASTROSTOMY TUBE COMPARISON:  None Available. MEDICATIONS: None. CONTRAST:  15 mL Omnipaque 300 administered into the gastric lumen FLUOROSCOPY TIME:  Three mGy COMPLICATIONS: None immediate. PROCEDURE: Informed written consent was obtained from the patient after a discussion of the risks, benefits and alternatives to treatment. Questions regarding the procedure were encouraged and answered. A timeout was performed prior to the initiation of the procedure. The upper abdomen and external portion of the existing gastrostomy tube was prepped and draped in the usual sterile fashion, and a sterile drape was applied covering the operative field. Maximum barrier sterile technique with sterile gowns and gloves were used for the procedure. A timeout was performed prior to the initiation of the procedure. The existing gastrostomy tube was injected with a small amount of contrast which demonstrated a preserved track into the gastric lumen. The existing gastrostomy tube was cannulated with a stiff Glidewire which was coiled within the gastric fundus. Over the stiff guide wire, the existing gastrostomy tube was removed and exchanged for a new 20-French MIC balloon inflatable gastrostomy tube. The balloon was inflated and disc  was cinched. Contrast was injected and several spot fluoroscopic images were obtained in various obliquities to confirm appropriate intraluminal positioning. A dressing was placed. The patient tolerated the procedure well without immediate postprocedural complication. IMPRESSION: Successful fluoroscopic guided replacement of a new 20-French gastrostomy tube. The new gastrostomy tube is ready for immediate use. PLAN: Return in 6 months for routine gastrostomy tube exchange. Marliss Coots, MD [facility] Electronically Signed   By: Marliss Coots M.D.   On: 03/27/2023 12:36   CT ABDOMEN W CONTRAST Result Date: 03/27/2023 CLINICAL DATA:  Abdominal wall pain. Status post percutaneous gastrostomy tube placement 03/21/2023. EXAM: CT ABDOMEN WITH CONTRAST TECHNIQUE: Multidetector CT imaging of the abdomen was performed using the standard protocol following bolus administration of intravenous contrast. RADIATION DOSE REDUCTION: This exam was performed according to the departmental dose-optimization program which includes automated exposure control, adjustment of the mA and/or kV according to patient size and/or use of iterative reconstruction technique. CONTRAST:  75mL OMNIPAQUE IOHEXOL 350 MG/ML SOLN COMPARISON:  03/18/2023 FINDINGS: Lower chest: Dependent atelectasis. Hepatobiliary: No suspicious focal abnormality within the liver parenchyma. There is no evidence for gallstones, gallbladder wall thickening, or pericholecystic fluid. No intrahepatic or extrahepatic biliary dilation. Pancreas: No focal mass lesion. No dilatation of the main duct. No intraparenchymal cyst. No peripancreatic edema. Spleen: No splenomegaly. No suspicious focal mass lesion. Adrenals/Urinary Tract: No adrenal nodule or mass. Kidneys unremarkable. Stomach/Bowel: Anterior wall the stomach is tented anteriorly by a percutaneous  gastrostomy tube. There is edema in the left rectus sheath  along the tract of the percutaneous nephrostomy tube with mild edema/fluid in the preperitoneal fat between the stomach and the anterior abdominal wall. Retention cap of the gastrostomy tube cannot be confirmed to be intraluminal on axial or sagittal imaging. There is a curvilinear radiopaque foreign body extending from the retention cap into the gastric lumen. Anterior wall the stomach in this region is thick with areas of apparent mural edema/fluid. There is apparent trace gas between the gastric wall in the retention cap which may be extraluminal. No small bowel dilatation within the visualized abdomen. Visualized segments of the ascending and transverse colon are distended with gas. Vascular/Lymphatic: No abdominal aortic aneurysm. No abdominal aortic atherosclerotic calcification. There is no gastrohepatic or hepatoduodenal ligament lymphadenopathy. No retroperitoneal or mesenteric lymphadenopathy. Other: There is some trace fluid adjacent to the liver and spleen. Musculoskeletal: No worrisome lytic or sclerotic osseous abnormality. IMPRESSION: 1. Mushroom retention cap of the percutaneous gastrostomy tube cannot be confirmed to be intraluminal on axial or sagittal imaging. There is a curvilinear radiopaque foreign body extending from the retention cap into the gastric lumen. Anterior wall the stomach in this region is thick with areas of apparent mural edema/fluid. There is apparent trace gas between the gastric wall and the retention cap which may be extraluminal. Further evaluation to confirm gastrostomy tube tip placement recommended. 2. Edema in the left rectus sheath along the tract of the percutaneous gastrostomy tube with mild edema/fluid in the preperitoneal fat between the stomach and the anterior abdominal wall. These changes are not entirely unexpected 6 days after tube placement. 3. Trace fluid adjacent to the liver and spleen. Electronically Signed   By: Kennith Center M.D.   On: 03/27/2023 05:15    DG Abd Portable 1V Result Date: 03/26/2023 CLINICAL DATA:  Abdominal pain. EXAM: PORTABLE ABDOMEN - 1 VIEW COMPARISON:  03/21/2023 FINDINGS: Percutaneous gastrostomy tube overlies the medial left abdomen. There is diffuse gaseous distension of small bowel and colon with prominent stool visible in the right colon and rectum. Visualized bony anatomy unremarkable. IMPRESSION: Diffuse gaseous distension of small bowel and colon with prominent stool in the right colon and rectum. Imaging features are most compatible with ileus. Electronically Signed   By: Kennith Center M.D.   On: 03/26/2023 09:31   DG Chest Port 1V same Day Result Date: 03/26/2023 CLINICAL DATA:  Fever. EXAM: PORTABLE CHEST 1 VIEW COMPARISON:  None Available. FINDINGS: The lungs are clear without focal pneumonia, edema, pneumothorax or pleural effusion. Cardiopericardial silhouette is at upper limits of normal for size. No acute bony abnormality. IMPRESSION: No active disease. Electronically Signed   By: Kennith Center M.D.   On: 03/26/2023 08:49   Recent Labs    03/26/23 0210  WBC 12.8*  HGB 11.1*  HCT 31.6*  PLT 88*   Recent Labs    03/26/23 0210  NA 129*  K 3.7  CL 99  CO2 21*  GLUCOSE 114*  BUN 13  CREATININE 0.73  CALCIUM 8.7*    Intake/Output Summary (Last 24 hours) at 03/27/2023 1331 Last data filed at 03/26/2023 2015 Gross per 24 hour  Intake 1509.58 ml  Output 0 ml  Net 1509.58 ml        Physical Exam: Vital Signs Blood pressure 127/79, pulse (!) 109, temperature 99.4 F (37.4 C), resp. rate 19, height 5\' 4"  (1.626 m), weight 77.1 kg, SpO2 100%, unknown if currently breastfeeding.   General: NAD HEENT:  Head is normocephalic, atraumatic, mucous membranes moist Neck: Supple without JVD or lymphadenopathy Heart: Reg rate and rhythm. No murmurs rubs or gallops Chest: CTA bilaterally without wheezes, rales, or rhonchi; no distress Abdomen: Positive bowel sounds, abdomen is soft, positive abdominal  tenderness around G-tube.  Urostomy tube in place. Extremities: No clubbing, cyanosis, or edema. Pulses are 2+ Psych: Appears very anxious tearful related to her scheduled procedure. Skin: Clean and intact without signs of breakdown Musculoskeletal:     Cervical back: Normal range of motion and neck supple.     Comments: LUE/LLE moving well again gravity- couldn't complete testing due to perseveration RUE- no voluntary movement seen of RUE or RLE even to tactile stimuli     Neurological:     Comments: Patient came back from working with therapy.  Awake and alert.  She does make eye contact with examiner.  Patient is aphasic but does have some spontaneous simple word responses.  MAS of 3-4 in R shoulder esp External rotation; MAS of 4 in R elbow- lacking 45 degrees of extension with a lot of work; MAS of 2-3 of R hand- in fist at rest, but could completely open fingers and get past wrist neutral.  Difficult to assess sensation      Assessment/Plan: 1. Functional deficits which require 3+ hours per day of interdisciplinary therapy in a comprehensive inpatient rehab setting. Physiatrist is providing close team supervision and 24 hour management of active medical problems listed below. Physiatrist and rehab team continue to assess barriers to discharge/monitor patient progress toward functional and medical goals  Care Tool:  Bathing    Body parts bathed by patient: Right arm, Chest, Abdomen, Front perineal area   Body parts bathed by helper: Left arm, Buttocks, Right upper leg, Left upper leg, Right lower leg, Left lower leg, Face     Bathing assist Assist Level: Maximal Assistance - Patient 24 - 49%     Upper Body Dressing/Undressing Upper body dressing   What is the patient wearing?: Pull over shirt    Upper body assist Assist Level: Maximal Assistance - Patient 25 - 49%    Lower Body Dressing/Undressing Lower body dressing      What is the patient wearing?: Incontinence  brief, Pants     Lower body assist Assist for lower body dressing: Total Assistance - Patient < 25%     Toileting Toileting    Toileting assist Assist for toileting: 2 Helpers     Transfers Chair/bed transfer  Transfers assist  Chair/bed transfer activity did not occur: Safety/medical concerns (unsafe to get up)  Chair/bed transfer assist level: 2 Helpers     Locomotion Ambulation   Ambulation assist      Assist level: 2 helpers (+3 assist) Assistive device: Other (comment) (3 muskateer assist + chair follow) Max distance: 21ft   Walk 10 feet activity   Assist     Assist level: 2 helpers Assistive device: Other (comment) (3 muskateer assist + chair follow)   Walk 50 feet activity   Assist Walk 50 feet with 2 turns activity did not occur: Safety/medical concerns         Walk 150 feet activity   Assist Walk 150 feet activity did not occur: Safety/medical concerns         Walk 10 feet on uneven surface  activity   Assist Walk 10 feet on uneven surfaces activity did not occur: Safety/medical concerns         Wheelchair  Assist Is the patient using a wheelchair?: Yes Type of Wheelchair: Manual Wheelchair activity did not occur: Safety/medical concerns         Wheelchair 50 feet with 2 turns activity    Assist    Wheelchair 50 feet with 2 turns activity did not occur: Safety/medical concerns       Wheelchair 150 feet activity     Assist  Wheelchair 150 feet activity did not occur: Safety/medical concerns       Blood pressure 127/79, pulse (!) 109, temperature 99.4 F (37.4 C), resp. rate 19, height 5\' 4"  (1.626 m), weight 77.1 kg, SpO2 100%, unknown if currently breastfeeding.   Medical Problem List and Plan: 1. 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  2.  Antithrombotics: -DVT/anticoagulation:  Pharmaceutical: Eliquis             -antiplatelet therapy: N/A 3. Pain Management: Neurontin 100 mg 3 times daily, Topamax 25 mg twice daily, oxycodone 5 mg every 6 hours as needed 4. Mood/Behavior/Sleep: Klonopin 0.5 mg nightly, melatonin 5 mg nightly as needed             -antipsychotic agents: suggest Depakote or increase in gabapentin to help calm pt down;  5. Neuropsych/cognition: This patient is not capable of making decisions on her own behalf. 6. Skin/Wound Care: routine skin care for PEG and overall skin 7. Fluids/Electrolytes/Nutrition: will assess labs and recheck labs in AM 8.  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  Followed by oncology/hematology  9.  Anemia/thrombocytopenia/iron deficiency.  Follow-up CBC.  Continue Niferex  -Hemoglobin stable 11.1, platelets 88K 10.  Dysphagia.  Status post gastrostomy tube 03/21/2023 per interventional radiology.  Diet has been advanced to regular.  Dietary follow-up- still on TF's because not eating well.  11.  E. coli UTI.  Antibiotic therapy completed 12.  Tobacco/alcohol use.  Counseling 13. Severe RUE spasticity- would strongly suggest Botox of RUE soon as well as resting hand splint for R wrist and R PRAFO- will order.  14. Abd pain- from PEG vs constipation- if no BM, might need KUB.   -IR consult to assess Gtube site.  Pus noted at skin site. Appears CT scan is ordered  -Likely G-tube infection.  She is going for a IR procedure today.  Hospitalist team following-appreciate assistance.  She has been started on vancomycin and ceftriaxone for abdominal infection.  Echocardiogram and cultures have been ordered.  15.  Elevated transaminases-mild  -Possibly reactive, continue to monitor 16.   Hyponatremia  -Currently on IVF, hospitalist consulted  -Will DC free water per tube   -Hospitalist team following.  Recheck labs today 17.  Ileus  -Hospitalist consulted, continue IVF, discontinued tube feeds  -1/23 improving.  She had bowel movements today and yesterday      LOS: 2 days A FACE TO FACE EVALUATION WAS PERFORMED  Fanny Dance 03/27/2023, 1:31 PM

## 2023-03-27 NOTE — Progress Notes (Signed)
Occupational Therapy Session Note  Patient Details  Name: Jacqueline [name] MRN: [id number] Date of Birth: 16-Dec-2000  Today's Date: 03/27/2023 OT Individual Time: 1300-1423 OT Individual Time Calculation (min): 83 min    Short Term Goals: Week 1:  OT Short Term Goal 1 (Week 1): Pt will sit EOB for ADLs for > 3 mins with mod A for sitting balance OT Short Term Goal 2 (Week 1): Pt will complete functional toilet transfer with max A using LRAD OT Short Term Goal 3 (Week 1): Pt with attend to RUE during ADL task with min verbal cues during functional task OT Short Term Goal 4 (Week 1): Pt will donn LB clothing with max A using AE PRN  Skilled Therapeutic Interventions/Progress Updates:    1:1 Pt received in the bed. Pt kept boyfriend on facetime throughout session and kept him updated on progress and activities we did throughout. Pt came to EOB with mod A with extra time and instructional cues. Pt able to continue to follow one step commands with extra time and explanation of task we are doing. Pt transferred with mod A with +2 for safety stand pivot to w/c. Pt performed stand pivot to the toilet and was able to void continently!! No BM. Pt donned new brief, pants and hygiene was performed with +2 in standing. Mod to max A to maintain standing balance while tech performed other tasks. Transitioned to the sink and pt performed oral care with sponge due to gums being sore (instead of toothbrush). Pt able to indicate her wants and needs with gestures and pointing; at times pt able to say the ending portion of a word. In the hallway focus on ability to perform w/c prolusion in the hallway ~ 45 feet. Facilitation for sequencing.   Attempted to have her read something at the word level - unclear whether she could read.  Pt transferred back into the bed with mod A and mod A to transfer back into supine.   Therapy Documentation Precautions:  Precautions Precautions: Fall Precaution Comments: PEG,  expressive aphasia with agitation, Rt hemi Restrictions Weight Bearing Restrictions Per Provider Order: No  Pain: Pain Assessment Faces Pain Scale: No hurt    Therapy/Group: Individual Therapy  Roney Mans [facility] 03/27/2023, 3:37 PM

## 2023-03-27 NOTE — Progress Notes (Signed)
Physical Therapy Session Note  Patient Details  Name: WYLLA MEDLEY MRN: [id number] Date of Birth: August 17, 2000  Today's Date: 03/27/2023 PT Missed Time: 60 Minutes Missed Time Reason: Out of hospital appointment (off unit for surgery)  Pt off unit for surgery at scheduled therapy session time, will reattempt as schedule allows.  Edwin Cap PT, DPT 03/27/2023, 10:23 AM

## 2023-03-27 NOTE — Progress Notes (Signed)
Physical Therapy Session Note  Patient Details  Name: Jacqueline [name] MRN: [id number] Date of Birth: [birth year]  Today's Date: 03/27/2023 PT Individual Time: 0800-0856 PT Individual Time Calculation (min): 56 min   Short Term Goals: Week 1:  PT Short Term Goal 1 (Week 1): pt will transfer supine<>sitting EOB with mod A of 1 PT Short Term Goal 2 (Week 1): pt will transfer sit<>stand with LRAD and mod A of 1 PT Short Term Goal 3 (Week 1): pt will ambulate 2ft with LRAD and max A of 1  Skilled Therapeutic Interventions/Progress Updates:   Received pt semi-reclined in bed asleep. Upon wakening, pt lethargic and fluctuating falling in/out of sleep initially - woke up when [facility] called. Pt appeared to have continued pain surrounding PEG tube. Session with emphasis on dressing, functional mobility/transfers, and generalized strengthening and endurance. Checked to see if pt was clean, then RN arrived to adminster medications and reported plan for pt to go to procedure to reposition PEG tube today. Pt tearful, scared, and upset about this and entire session spent calming and reassurring pt. Provided pt with 18x18 manual WC and legrests. Donned scrub pants in supine with max A for RLE management. Pt transferred semi-reclined<>sitting R EOB with HOB elevated and use of bedrails with max A and increased time. Stood from EOB with HHA +2 and required max A to pull pants over hips. Returned to sitting EOB and RN present to disconnect IV - doffed gown and donned pull over shirt with mod A for RUE management. Pt transferred bed<>WC stand<>pivot with min/mod A +2 HHA while blocking R knee, then NT present to check vitals. Pt transported to/from room in [facility] dependently for time management purposes. Sat at window in dayroom and looked outside (attempt to distract pt from upcoming procedure). RN arrived and requested pt return to bed for transport. Transferred WC<>bed stand<>pivot with mod A (+2 providing light min A)  while blocking R knee. Transferred into supine with +2 assist and scooted to [facility]. MD arrived - updated on pt's anxiety regarding procedure. Concluded session with pt semi-reclined in bed, needs within reach, and bed alarm on. 2 different MDs at bedside and transport arrived to take pt to procedure.    Therapy Documentation Precautions:  Precautions Precautions: Fall Precaution Comments: PEG, expressive aphasia with agitation, Rt hemi Restrictions Weight Bearing Restrictions Per Provider Order: No  Therapy/Group: Individual Therapy Marlana Salvage Zaunegger Blima Rich PT, DPT 03/27/2023, 6:47 AM

## 2023-03-27 NOTE — Progress Notes (Signed)
PHARMACY - PHYSICIAN COMMUNICATION CRITICAL VALUE ALERT - BLOOD CULTURE IDENTIFICATION (BCID)  Jacqueline [name] is an 23 y.o. female admitted to Rehab s/p CVA, now with fevers and possible sepsis   Assessment:    2/2 blood cultures growing methicillin resistant Staphylococcus epidermidis  Name of physician (or Provider) Contacted:  Dr. Allena Katz  Current antibiotics:  Rocephin and Flagyl  Changes to prescribed antibiotics recommended:  Add Vancomycin F/U ID recommendations  Results for orders placed or performed during the hospital encounter of 03/25/23  Blood Culture ID Panel (Reflexed) (Collected: 03/26/2023  2:10 AM)  Result Value Ref Range   Enterococcus faecalis NOT DETECTED NOT DETECTED   Enterococcus Faecium NOT DETECTED NOT DETECTED   Listeria monocytogenes NOT DETECTED NOT DETECTED   Staphylococcus species DETECTED (A) NOT DETECTED   Staphylococcus aureus (BCID) NOT DETECTED NOT DETECTED   Staphylococcus epidermidis DETECTED (A) NOT DETECTED   Staphylococcus lugdunensis NOT DETECTED NOT DETECTED   Streptococcus species NOT DETECTED NOT DETECTED   Streptococcus agalactiae NOT DETECTED NOT DETECTED   Streptococcus pneumoniae NOT DETECTED NOT DETECTED   Streptococcus pyogenes NOT DETECTED NOT DETECTED   A.calcoaceticus-baumannii NOT DETECTED NOT DETECTED   Bacteroides fragilis NOT DETECTED NOT DETECTED   Enterobacterales NOT DETECTED NOT DETECTED   Enterobacter cloacae complex NOT DETECTED NOT DETECTED   Escherichia coli NOT DETECTED NOT DETECTED   Klebsiella aerogenes NOT DETECTED NOT DETECTED   Klebsiella oxytoca NOT DETECTED NOT DETECTED   Klebsiella pneumoniae NOT DETECTED NOT DETECTED   Proteus species NOT DETECTED NOT DETECTED   Salmonella species NOT DETECTED NOT DETECTED   Serratia marcescens NOT DETECTED NOT DETECTED   Haemophilus influenzae NOT DETECTED NOT DETECTED   Neisseria meningitidis NOT DETECTED NOT DETECTED   Pseudomonas aeruginosa NOT DETECTED NOT  DETECTED   Stenotrophomonas maltophilia NOT DETECTED NOT DETECTED   Candida albicans NOT DETECTED NOT DETECTED   Candida auris NOT DETECTED NOT DETECTED   Candida glabrata NOT DETECTED NOT DETECTED   Candida krusei NOT DETECTED NOT DETECTED   Candida parapsilosis NOT DETECTED NOT DETECTED   Candida tropicalis NOT DETECTED NOT DETECTED   Cryptococcus neoformans/gattii NOT DETECTED NOT DETECTED   Methicillin resistance mecA/C DETECTED (A) NOT DETECTED    [name] 03/27/2023  5:23 AM

## 2023-03-27 NOTE — Consult Note (Signed)
[facility]    Date of Admission:  03/25/2023     Total days of antibiotics                Reason for Consult: ?Bacteremia / Fever    Referring Provider: Carlis Abbott Primary Care Provider: [name], MD   Assessment: Jacqueline [name] is a 23 y.o. female admitted to inpatient rehab with:   Acute L MCA CVA, Failed Thrombectomy -  Recovering on rehab floor now. Expressive aphasia and RUA spacticity. PEG for poor PO intake.  -Per rehab team   Fever - Leukocytosis -  Last few days there have been concerns over infection surrounding the PEG site - increased pain and purulent drainage documented around site. CT scan of abdomen does reveal some fluid in the rectus sheath and swelling around the site in the setting of malpositioned bumper. I discussed with Dr. Elby Showers and he felt there was only irritation from malpositioned site and no concern for infection at the time of his procedure today when Gtube was exchanged. On exam only thin bloody appearing drainage. No signs of abdominal wall cellulitis.  -continue vancomycin alone for bacteremia. -will also cover any possibility of soft tissue infection of the malpositioned PEG tube   Bacteremia, MRSE 3/4 bottles -  Blood cultures from 1/17 no growth. Growing MRSE from 1/21 site.  2 PIVs R FA (1/18) and R UA (1/22). Had IV site infiltration on 12/29 documented in L UA - no cording or warmth or abnormalities on skin exam here. She says yes to everything when asked and pointing to various places for pain. She has her knees drawn up in bed with flexion beyond 90 deg and no signs of acute joint infections.   No artificial joints / cardiac devices. PEG in place with documented concerns as outlined above - possible source; alternatively my be skin contaminant given prior blood cultures were no growth back on 1/17 with first fevers / leukocytosis. No source identified and lower risk for more invasive infections. PIVs  in situ look fine and unremarkable. Abdominal wall assessment above.  -Will plan 7 d course of IV vancomycin which would also cover possible soft tissue involvement of the abdominal wall.  -no need to repeat cultures or pursue other work up  -trend fever curve and will see her back tomorrow in follow up  Mediation Monitoring -  Follow creatinine and Vanc AUCs.    Plan: Continue IV vancomycin alone - plan 7d treatment course  CBC in AM  Trend fever curves     Principal Problem:   Left middle cerebral artery stroke [facility]) Active Problems:   Thrombocytopenia (HCC)   Iron deficiency anemia   Lupus anticoagulant disorder (HCC)   SIRS (systemic inflammatory response syndrome) (HCC)   Hyponatremia   Ileus (HCC)   Pain around PEG tube site, initial encounter   Transaminitis   History of pulmonary embolism    apixaban  5 mg Per Tube BID   clonazePAM  0.5 mg Oral QHS   vitamin B-12  2,000 mcg Per Tube Daily   folic acid  1 mg Per Tube Daily   gabapentin  100 mg Oral TID   iron polysaccharides  150 mg Per Tube Daily   multivitamin with minerals  1 tablet Per Tube Daily   polyethylene glycol  17 g Per Tube Daily   topiramate  25 mg Per Tube BID    HPI: Jacqueline [name] is a 23 y.o. female.   Admitted 12/20 with stroke like symptoms. Found to have acute left MCA stroke. She has a history of thrombocytopenia, PE and lupus anticoagulant (lost to follow up with hematology). 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 CTA revealed L M2 MCA occlusion - went ot IR for mechanical thrombectomy that was unfortunately not successful. Follow up MRI on 12/26 reveled a very large extension of infarcted tissue - required ICU care. Required PEG tube placement on 1/17. She had a in house fever that was attributed to E coli UTI and treated with 5d of rocephin completed 12/28.  D/C to CIR on 1/21  Fever noted > 101 on 1/17  with new leukocytosis 13.6k - blood cultures drawn then and no growth (final). Received 1 dose of cefazolin then.  Recurrent fever > 101 x 2 on 1/22 - GPCs growing in 3/4 bottles with MRSE detected on BCID.  She had image guided G-tube exchange with IR today as part of work up for abdominal wall pain around her insertion site. CT scan reviewed   She indicates her abdomen hurts.   Review of Systems: Review of Systems  Gastrointestinal:  Positive for abdominal pain.     Past Medical History:  Diagnosis Date   Bacterial vaginitis 07/10/2016   Candida vaginitis 07/24/2016   Headache in pregnancy, antepartum, third trimester 07/24/2016   Kell isoimmunization during pregnancy     Social History   Tobacco Use   Smoking status: Some Days    Types: Cigars   Smokeless tobacco: Never   Tobacco comments:    1 B&M per day  Vaping Use   Vaping status: Never Used  Substance Use Topics   Alcohol use: Yes   Drug use: No    Family History  Problem Relation Age of Onset   Hypertension Maternal Grandmother    Diabetes Maternal Grandmother    Cancer Neg Hx    No Known Allergies  OBJECTIVE: Blood pressure 127/79, pulse (!) 109, temperature 99.4 F (37.4 C), resp. rate 19, height 5\' 4"  (1.626 m), weight 77.1 kg, SpO2 100%, unknown if currently breastfeeding.  Physical Exam Constitutional:      Appearance: Normal appearance. She is not ill-appearing.  HENT:     Mouth/Throat:     Mouth: Mucous membranes are moist.     Pharynx: No oropharyngeal exudate.  Eyes:     Conjunctiva/sclera: Conjunctivae normal.     Pupils: Pupils are equal, round, and reactive to light.  Cardiovascular:     Rate and Rhythm: Normal rate and regular rhythm.  Pulmonary:     Effort: Pulmonary effort is normal.     Breath sounds: Normal breath sounds.  Abdominal:     General: There is no distension.     Palpations: Abdomen is soft.     Tenderness: There is abdominal tenderness.     Comments: Thin bloody  drainage on abdominal wall from G-tube procedure today. No obvious signs of cellulitis of the abdominal wall.   Skin:    General: Skin is warm and dry.     Findings: No rash.  Neurological:     Mental Status: She is alert.     Lab Results Lab Results  Component Value Date   WBC 12.8 (H) 03/26/2023   HGB 11.1 (L) 03/26/2023   HCT 31.6 (L) 03/26/2023   MCV 78.6 (L) 03/26/2023   PLT 88 (L)  03/26/2023    Lab Results  Component Value Date   CREATININE 0.73 03/26/2023   BUN 13 03/26/2023   NA 129 (L) 03/26/2023   K 3.7 03/26/2023   CL 99 03/26/2023   CO2 21 (L) 03/26/2023    Lab Results  Component Value Date   ALT 58 (H) 03/26/2023   AST 44 (H) 03/26/2023   ALKPHOS 97 03/26/2023   BILITOT 0.4 03/26/2023     Microbiology: Recent Results (from the past 240 hours)  Culture, blood (Routine X 2) w Reflex to ID Panel     Status: None (Preliminary result)   Collection Time: 03/22/23  6:19 AM   Specimen: BLOOD  Result Value Ref Range Status   Specimen Description BLOOD BLOOD RIGHT HAND  Final   Special Requests   Final    BOTTLES DRAWN AEROBIC AND ANAEROBIC Blood Culture results may not be optimal due to an inadequate volume of blood received in culture bottles   Culture   Final    NO GROWTH 4 DAYS Performed at [facility], [address], [place], [place] [address]    Report Status PENDING  Incomplete  Culture, blood (Routine X 2) w Reflex to ID Panel     Status: None (Preliminary result)   Collection Time: 03/22/23  6:19 AM   Specimen: BLOOD  Result Value Ref Range Status   Specimen Description BLOOD BLOOD LEFT HAND  Final   Special Requests   Final    BOTTLES DRAWN AEROBIC AND ANAEROBIC Blood Culture results may not be optimal due to an inadequate volume of blood received in culture bottles   Culture   Final    NO GROWTH 4 DAYS Performed at [facility], [address] [place], [place] [address]    Report Status PENDING  Incomplete  Respiratory (~20  pathogens) panel by PCR     Status: None   Collection Time: 03/22/23  7:56 AM   Specimen: Nasopharyngeal Swab; Respiratory  Result Value Ref Range Status   Adenovirus NOT DETECTED NOT DETECTED Final   Coronavirus 229E NOT DETECTED NOT DETECTED Final    Comment: (NOTE) The Coronavirus on the Respiratory Panel, DOES NOT test for the novel  Coronavirus (2019 nCoV)    Coronavirus HKU1 NOT DETECTED NOT DETECTED Final   Coronavirus NL63 NOT DETECTED NOT DETECTED Final   Coronavirus OC43 NOT DETECTED NOT DETECTED Final   Metapneumovirus NOT DETECTED NOT DETECTED Final   Rhinovirus / Enterovirus NOT DETECTED NOT DETECTED Final   Influenza A NOT DETECTED NOT DETECTED Final   Influenza B NOT DETECTED NOT DETECTED Final   Parainfluenza Virus 1 NOT DETECTED NOT DETECTED Final   Parainfluenza Virus 2 NOT DETECTED NOT DETECTED Final   Parainfluenza Virus 3 NOT DETECTED NOT DETECTED Final   Parainfluenza Virus 4 NOT DETECTED NOT DETECTED Final   Respiratory Syncytial Virus NOT DETECTED NOT DETECTED Final   Bordetella pertussis NOT DETECTED NOT DETECTED Final   Bordetella Parapertussis NOT DETECTED NOT DETECTED Final   Chlamydophila pneumoniae NOT DETECTED NOT DETECTED Final   Mycoplasma pneumoniae NOT DETECTED NOT DETECTED Final    Comment: Performed at [facility], [address], [place], [place] [address]  Culture, blood (Routine X 2) w Reflex to ID Panel     Status: None (Preliminary result)   Collection Time: 03/26/23  2:10 AM   Specimen: BLOOD  Result Value Ref Range Status   Specimen Description BLOOD BLOOD LEFT HAND  Final  Special Requests   Final    BOTTLES DRAWN AEROBIC AND ANAEROBIC Blood Culture adequate volume   Culture  Setup Time   Final    GRAM POSITIVE COCCI IN CLUSTERS IN BOTH AEROBIC AND ANAEROBIC BOTTLES CRITICAL RESULT CALLED TO, READ BACK BY AND VERIFIED WITH: PHARMD G ABBOTT 03/27/2023 @ 0515 BY AB Performed at [facility], [address],  [place], [place] [address]    Culture GRAM POSITIVE COCCI  Final   Report Status PENDING  Incomplete  Blood Culture ID Panel (Reflexed)     Status: Abnormal   Collection Time: 03/26/23  2:10 AM  Result Value Ref Range Status   Enterococcus faecalis NOT DETECTED NOT DETECTED Final   Enterococcus Faecium NOT DETECTED NOT DETECTED Final   Listeria monocytogenes NOT DETECTED NOT DETECTED Final   Staphylococcus species DETECTED (A) NOT DETECTED Final    Comment: CRITICAL RESULT CALLED TO, READ BACK BY AND VERIFIED WITH: PHARMD G ABBOTT 03/27/2023 @ 0515 BY AB    Staphylococcus aureus (BCID) NOT DETECTED NOT DETECTED Final   Staphylococcus epidermidis DETECTED (A) NOT DETECTED Final    Comment: Methicillin (oxacillin) resistant coagulase negative staphylococcus. Possible blood culture contaminant (unless isolated from more than one blood culture draw or clinical case suggests pathogenicity). No antibiotic treatment is indicated for blood  culture contaminants. CRITICAL RESULT CALLED TO, READ BACK BY AND VERIFIED WITH: PHARMD G ABBOTT 03/27/2023 @ 0515 BY AB    Staphylococcus lugdunensis NOT DETECTED NOT DETECTED Final   Streptococcus species NOT DETECTED NOT DETECTED Final   Streptococcus agalactiae NOT DETECTED NOT DETECTED Final   Streptococcus pneumoniae NOT DETECTED NOT DETECTED Final   Streptococcus pyogenes NOT DETECTED NOT DETECTED Final   A.calcoaceticus-baumannii NOT DETECTED NOT DETECTED Final   Bacteroides fragilis NOT DETECTED NOT DETECTED Final   Enterobacterales NOT DETECTED NOT DETECTED Final   Enterobacter cloacae complex NOT DETECTED NOT DETECTED Final   Escherichia coli NOT DETECTED NOT DETECTED Final   Klebsiella aerogenes NOT DETECTED NOT DETECTED Final   Klebsiella oxytoca NOT DETECTED NOT DETECTED Final   Klebsiella pneumoniae NOT DETECTED NOT DETECTED Final   Proteus species NOT DETECTED NOT DETECTED Final   Salmonella species NOT DETECTED NOT DETECTED Final   Serratia  marcescens NOT DETECTED NOT DETECTED Final   Haemophilus influenzae NOT DETECTED NOT DETECTED Final   Neisseria meningitidis NOT DETECTED NOT DETECTED Final   Pseudomonas aeruginosa NOT DETECTED NOT DETECTED Final   Stenotrophomonas maltophilia NOT DETECTED NOT DETECTED Final   Candida albicans NOT DETECTED NOT DETECTED Final   Candida auris NOT DETECTED NOT DETECTED Final   Candida glabrata NOT DETECTED NOT DETECTED Final   Candida krusei NOT DETECTED NOT DETECTED Final   Candida parapsilosis NOT DETECTED NOT DETECTED Final   Candida tropicalis NOT DETECTED NOT DETECTED Final   Cryptococcus neoformans/gattii NOT DETECTED NOT DETECTED Final   Methicillin resistance mecA/C DETECTED (A) NOT DETECTED Final    Comment: CRITICAL RESULT CALLED TO, READ BACK BY AND VERIFIED WITH: PHARMD G ABBOTT 03/27/2023 @ 0515 BY AB Performed at [facility], [address] [place], [place] [address]   Culture, blood (Routine X 2) w Reflex to ID Panel     Status: None (Preliminary result)   Collection Time: 03/26/23  2:13 AM   Specimen: BLOOD  Result Value Ref Range Status   Specimen Description BLOOD BLOOD LEFT ARM  Final   Special Requests   Final    BOTTLES DRAWN AEROBIC AND ANAEROBIC  Blood Culture adequate volume   Culture  Setup Time   Final    GRAM POSITIVE COCCI AEROBIC BOTTLE ONLY CRITICAL VALUE NOTED.  VALUE IS CONSISTENT WITH PREVIOUSLY REPORTED AND CALLED VALUE. Performed at [facility], [address] [place], [place] [address]    Culture [facility] POSITIVE COCCI  Final   Report Status PENDING  Incomplete    Jacqueline Alberts, MSN, NP-C [facility] Pager: [phone]  03/27/2023 1:37 PM

## 2023-03-27 NOTE — Progress Notes (Addendum)
Pharmacy Antibiotic Note  Jacqueline [name] is a 23 y.o. female admitted to Rehab on 03/25/2023 s/p CVA, now with AMS/fevers/sepsis.  Pharmacy has been consulted for Vancomycin  dosing.  Plan: Vancomycin 1000 mg IV q8h  Height: 5\' 4"  (162.6 cm) Weight: 77.1 kg (169 lb 15.6 oz) IBW/kg (Calculated) : 54.7  Temp (24hrs), Avg:99.6 F (37.6 C), Min:98.3 F (36.8 C), Max:101.4 F (38.6 C)  Recent Labs  Lab 03/20/23 1047 03/21/23 0808 03/22/23 0618 03/22/23 0619 03/26/23 0210 03/26/23 0510  WBC 7.9 9.1  --  13.6* 12.8*  --   CREATININE  --   --   --  0.85 0.73  --   LATICACIDVEN  --   --  1.3  --  1.2 1.3    Estimated Creatinine Clearance: 109 mL/min (by C-G formula based on SCr of 0.73 mg/dL).    No Known Allergies   Jacqueline [name] 03/27/2023 7:41 AM

## 2023-03-27 NOTE — Progress Notes (Signed)
Inpatient Rehabilitation Care Coordinator Assessment and Plan Patient Details  Name: Jacqueline [name] MRN: [id number] Date of Birth: 01/08/2001  Today's Date: 03/27/2023  Hospital Problems: Principal Problem:   Left middle cerebral artery stroke [facility]) Active Problems:   Thrombocytopenia (HCC)   Iron deficiency anemia   Lupus anticoagulant disorder (HCC)   SIRS (systemic inflammatory response syndrome) (HCC)   Hyponatremia   Ileus (HCC)   Pain around PEG tube site, initial encounter   Transaminitis   History of pulmonary embolism  Past Medical History:  Past Medical History:  Diagnosis Date   Bacterial vaginitis 07/10/2016   Candida vaginitis 07/24/2016   Headache in pregnancy, antepartum, third trimester 07/24/2016   Kell isoimmunization during pregnancy    Past Surgical History:  Past Surgical History:  Procedure Laterality Date   CESAREAN SECTION N/A 09/04/2016   Procedure: CESAREAN SECTION;  Surgeon: Levie Heritage, DO;  Location: [facility];  Service: Obstetrics;  Laterality: N/A;   CESAREAN SECTION Bilateral    IR CT HEAD LTD  02/21/2023   IR GASTROSTOMY TUBE MOD SED  03/21/2023   IR PERCUTANEOUS ART THROMBECTOMY/INFUSION INTRACRANIAL INC DIAG ANGIO  02/21/2023   RADIOLOGY WITH ANESTHESIA N/A 02/21/2023   Procedure: IR WITH ANESTHESIA;  Surgeon: Julieanne Cotton, MD;  Location: [facility] OR;  Service: Radiology;  Laterality: N/A;   Social History:  reports that she has been smoking cigars. She has never used smokeless tobacco. She reports current alcohol use. She reports that she does not use drugs.  Family / Support Systems Marital Status: Single Patient Roles: Parent, Partner Spouse/Significant Other: Omar-boyfriend Children: Pt has a 5 and 10 yo who are being cared for by pt's mom Other Supports: Guy Sandifer [phone]  Tay-children's father  pt sister's Anticipated Caregiver: Mom, Boyfriend and sister's. Pt has 9 siblings but four are minors Ability/Limitations  of Caregiver: Mom will be the primary caregiver and pts siblings and her boyfriend will be assisting also. Mom does have ten children total but four are minors and in school. Caregiver Availability: 24/7 (Encouraged Mom to talk with family and come up with a rotation schedule) Family Dynamics: Close with family and siblings she has five grown siblings who plan to assist and Mom has four young siblings and she is also caring for pts two children.  Social History Preferred language: English Religion:  Cultural Background: No issues Education: HS Health Literacy - How often do you need to have someone help you when you read instructions, pamphlets, or other written material from your doctor or pharmacy?: Patient unable to respond Writes: Yes Employment Status: Unemployed Marine scientist Issues: No issues Guardian/Conservator: None-according to MD pt is not capable of making her own decisions at this time and since no formal POA/HCPOA in place will look toward her Mom to make any decisions while here   Abuse/Neglect Abuse/Neglect Assessment Can Be Completed: Unable to assess, patient is non-responsive or altered mental status  Patient response to: Social Isolation - How often do you feel lonely or isolated from those around you?: Patient unable to respond  Emotional Status Pt's affect, behavior and adjustment status: Obtained information from Mom per telephone she reports pt was independent lived on her own and cared for her two young children she did not work. Mom hopes she does well here and can do some for herself Recent Psychosocial Issues: other health issues Psychiatric History: no history/issues-pt due to young age would benefit from neruo-psych when appropriate and able to communicate with due to aphasia  Substance Abuse History: No issues but did take suboxone and ETOH prior to admission and then had event  Patient / Family Perceptions, Expectations & Goals Pt/Family  understanding of illness & functional limitations: Mom can explain her daughter had a stroke and her deficits, all are hopeful she will do well here. Mom does talk with the MD and wants to be involved in her medical update Premorbid pt/family roles/activities: mom, girlfriend, daughter, sister, friend, etc Anticipated changes in roles/activities/participation: resume Pt/family expectations/goals: Pt is not able to communicate due to aphasia will reassess. Mom states: " I hope she will do well there and get better."  [employer]: None Premorbid Home Care/DME Agencies: None Transportation available at discharge: self Is the patient able to respond to transportation needs?: Yes In the past 12 months, has lack of transportation kept you from medical appointments or from getting medications?: No (per Mom) In the past 12 months, has lack of transportation kept you from meetings, work, or from getting things needed for daily living?: No (per mom) Resource referrals recommended: Neuropsychology  Discharge Planning Living Arrangements: Spouse/significant other, Children Support Systems: Spouse/significant other, Children, Parent, Other relatives, Friends/neighbors Type of Residence: Private residence Insurance Resources: [employer] (specify county) ([employer] medicaid) Surveyor, quantity Resources: Family Support, Other (Comment) Financial Screen Referred: No Living Expenses: Psychologist, sport and exercise Management: Patient Does the patient have any problems obtaining your medications?: No Home Management: self Patient/Family Preliminary Plans: Plan to go to MOm's home where family can rotate and provide the care she will need. Boyfriend to assist also. Her apartment has a flight of steps which would not be feasible. Care Coordinator Barriers to Discharge: Insurance for SNF coverage Care Coordinator Anticipated Follow Up Needs: HH/OP  Clinical Impression Pt unable to assess due to aphasia will continue  to re-assess. Mom will be the point person to communicate with and will keep updated. Will work on discharge needs.   Lucy Chris 03/27/2023, 12:38 PM

## 2023-03-27 NOTE — Progress Notes (Unsigned)
Patient refused Echo 2x  Dondra Prader RVT RCS

## 2023-03-27 NOTE — Progress Notes (Signed)
Inpatient Rehabilitation Center Individual Statement of Services  Patient Name:  Jacqueline [name]  Date:  03/27/2023  Welcome to the Inpatient Rehabilitation Center.  Our goal is to provide you with an individualized program based on your diagnosis and situation, designed to meet your specific needs.  With this comprehensive rehabilitation program, you will be expected to participate in at least 3 hours of rehabilitation therapies Monday-Friday, with modified therapy programming on the weekends.  Your rehabilitation program will include the following services:  Physical Therapy (PT), Occupational Therapy (OT), Speech Therapy (ST), 24 hour per day rehabilitation nursing, Therapeutic Recreaction (TR), Neuropsychology, Care Coordinator, Rehabilitation Medicine, Nutrition Services, and Pharmacy Services  Weekly team conferences will be held on Wednesday to discuss your progress.  Your Inpatient Rehabilitation Care Coordinator will talk with you frequently to get your input and to update you on team discussions.  Team conferences with you and your family in attendance may also be held.  Expected length of stay: 3-4 weeks  Overall anticipated outcome:   Depending on your progress and recovery, your program may change. Your Inpatient Rehabilitation Care Coordinator will coordinate services and will keep you informed of any changes. Your Inpatient Rehabilitation Care Coordinator's name and contact numbers are listed  below.  The following services may also be recommended but are not provided by the Inpatient Rehabilitation Center:  Driving Evaluations Home Health Rehabiltiation Services Outpatient Rehabilitation Services    Arrangements will be made to provide these services after discharge if needed.  Arrangements include referral to agencies that provide these services.  Your insurance has been verified to be:  [facility] [employer] Your primary doctor is:  [name]  Pertinent  information will be shared with your doctor and your insurance company.  Inpatient Rehabilitation Care Coordinator:  Dossie Der, [place] [phone] or Luna Glasgow  Information discussed with and copy given to patient by: Lucy Chris, 03/27/2023, 11:25 AM

## 2023-03-27 NOTE — Progress Notes (Signed)
Chief Complaint: Patient was seen in consultation today for pain at G-tube site   Supervising Physician: Marliss Coots  Patient Status: [facility] - In-pt  History of Present Illness: Jacqueline [name] is a 23 y.o. female with recent CVA. Underwent perc G-tube placement on 03/21/23. Has developed some severe pain and drainage from site. CT was performed and it looks like bumper has retracted into abd wal. Imaging reviewed with Dr. Elby Showers and plan for image guided exchange/salvage.  PMHx, meds, labs, imaging, allergies reviewed. Feels well, no recent fevers, chills, illness. Has been NPO today as directed. Discussed with pt and mother. Difficult to converse with pt due to aphasia.   Past Medical History:  Diagnosis Date   Bacterial vaginitis 07/10/2016   Candida vaginitis 07/24/2016   Headache in pregnancy, antepartum, third trimester 07/24/2016   Kell isoimmunization during pregnancy     Past Surgical History:  Procedure Laterality Date   CESAREAN SECTION N/A 09/04/2016   Procedure: CESAREAN SECTION;  Surgeon: Levie Heritage, DO;  Location: [facility];  Service: Obstetrics;  Laterality: N/A;   CESAREAN SECTION Bilateral    IR CT HEAD LTD  02/21/2023   IR GASTROSTOMY TUBE MOD SED  03/21/2023   IR PERCUTANEOUS ART THROMBECTOMY/INFUSION INTRACRANIAL INC DIAG ANGIO  02/21/2023   RADIOLOGY WITH ANESTHESIA N/A 02/21/2023   Procedure: IR WITH ANESTHESIA;  Surgeon: Julieanne Cotton, MD;  Location: [facility] OR;  Service: Radiology;  Laterality: N/A;    Allergies: Patient has no known allergies.  Medications:  Current Facility-Administered Medications:    0.9 %  sodium chloride infusion, , Intravenous, Continuous, Love, Pamela S, PA-C, Stopped at 03/26/23 1820   acetaminophen (TYLENOL) tablet 650 mg, 650 mg, Oral, Q4H PRN, 650 mg at 03/27/23 0132 **OR** acetaminophen (TYLENOL) 160 MG/5ML solution 650 mg, 650 mg, Per Tube, Q4H PRN, 650 mg at 03/26/23 0029 **OR** acetaminophen  (TYLENOL) suppository 650 mg, 650 mg, Rectal, Q4H PRN, Angiulli, Mcarthur Rossetti, PA-C   apixaban (ELIQUIS) tablet 5 mg, 5 mg, Per Tube, BID, Raulkar, Drema Pry, MD, 5 mg at 03/27/23 0818   bisacodyl (DULCOLAX) suppository 10 mg, 10 mg, Rectal, Daily PRN, Angiulli, Mcarthur Rossetti, PA-C, 10 mg at 03/26/23 1134   cefTRIAXone (ROCEPHIN) 2 g in sodium chloride 0.9 % 100 mL IVPB, 2 g, Intravenous, Q24H, Smith, Rondell A, MD, Last Rate: 200 mL/hr at 03/26/23 1732, 2 g at 03/26/23 1732   clonazePAM (KLONOPIN) tablet 0.5 mg, 0.5 mg, Oral, QHS, Raulkar, Drema Pry, MD, 0.5 mg at 03/26/23 2057   cyanocobalamin (VITAMIN B12) tablet 2,000 mcg, 2,000 mcg, Per Tube, Daily, Raulkar, Drema Pry, MD, 2,000 mcg at 03/27/23 0818   folic acid (FOLVITE) tablet 1 mg, 1 mg, Per Tube, Daily, Raulkar, Drema Pry, MD, 1 mg at 03/27/23 0818   gabapentin (NEURONTIN) capsule 100 mg, 100 mg, Oral, TID, Angiulli, Mcarthur Rossetti, PA-C, 100 mg at 03/27/23 1610   hydrOXYzine (ATARAX) tablet 25 mg, 25 mg, Per Tube, TID PRN, Rolly Salter, MD   iron polysaccharides (NIFEREX) capsule 150 mg, 150 mg, Per Tube, Daily, Raulkar, Drema Pry, MD, 150 mg at 03/27/23 0817   LORazepam (ATIVAN) injection 0.5 mg, 0.5 mg, Intravenous, Once PRN, Rolly Salter, MD   melatonin tablet 5 mg, 5 mg, Per Tube, QHS PRN, Raulkar, Drema Pry, MD   metroNIDAZOLE (FLAGYL) IVPB 500 mg, 500 mg, Intravenous, Q12H, Smith, Rondell A, MD, Last Rate: 100 mL/hr at 03/27/23 0627, 500 mg at 03/27/23 9604   multivitamin with minerals tablet  1 tablet, 1 tablet, Per Tube, Daily, Raulkar, Drema Pry, MD, 1 tablet at 03/27/23 6962   oxyCODONE (Oxy IR/ROXICODONE) immediate release tablet 5 mg, 5 mg, Per Tube, Q4H PRN, Raulkar, Drema Pry, MD, 5 mg at 03/27/23 9528   polyethylene glycol (MIRALAX / GLYCOLAX) packet 17 g, 17 g, Per Tube, Daily, Raulkar, Drema Pry, MD, 17 g at 03/27/23 0818   sorbitol 70 % solution 30 mL, 30 mL, Per Tube, Daily PRN, Raulkar, Drema Pry, MD   topiramate (TOPAMAX) tablet  25 mg, 25 mg, Per Tube, BID, Raulkar, Drema Pry, MD, 25 mg at 03/27/23 0818   vancomycin (VANCOCIN) IVPB 1000 mg/200 mL premix, 1,000 mg, Intravenous, Q8H, Raulkar, Drema Pry, MD, Last Rate: 200 mL/hr at 03/27/23 0819, 1,000 mg at 03/27/23 4132    Family History  Problem Relation Age of Onset   Hypertension Maternal Grandmother    Diabetes Maternal Grandmother    Cancer Neg Hx     Social History   Socioeconomic History   Marital status: Single    Spouse name: Not on file   Number of children: Not on file   Years of education: Not on file   Highest education level: Not on file  Occupational History   Not on file  Tobacco Use   Smoking status: Some Days    Types: Cigars   Smokeless tobacco: Never   Tobacco comments:    1 B&M per day  Vaping Use   Vaping status: Never Used  Substance and Sexual Activity   Alcohol use: Yes   Drug use: No   Sexual activity: Yes    Partners: Male    Birth control/protection: None  Other Topics Concern   Not on file  Social History Narrative   [phone]- patient's confidential cell   Social Drivers of Health   Financial Resource Strain: Not on File (06/21/2021)   Received from [employer], [employer]    Financial Resource Strain: 0  Food Insecurity: No Food Insecurity (03/07/2023)   Hunger Vital Sign    Worried About Running Out of Food in the Last Year: Never true    Ran Out of Food in the Last Year: Never true  Transportation Needs: Patient Unable To Answer (02/21/2023)   PRAPARE - Transportation    Lack of Transportation (Medical): Patient unable to answer    Lack of Transportation (Non-Medical): Patient unable to answer  Physical Activity: Not on File (06/21/2021)   Received from [place], [place]   Physical Activity    Physical Activity: 0  Stress: Not on File (06/21/2021)   Received from [facility], [place]   Stress    Stress: 0  Social Connections: Not on File (11/16/2022)   Received from [employer]     Connectedness: 0    Review of Systems: A 12 point ROS discussed and pertinent positives are indicated in the HPI above.  All other systems are negative.  Review of Systems  Vital Signs: BP 109/70 (BP Location: Left Arm)   Pulse (!) 109   Temp 98.7 F (37.1 C) (Oral)   Resp 18   Ht 5\' 4"  (1.626 m)   Wt 169 lb 15.6 oz (77.1 kg)   SpO2 100%   BMI 29.18 kg/m   Physical Exam Constitutional:      Appearance: She is not ill-appearing.  HENT:     Mouth/Throat:     Mouth: Mucous membranes are moist.     Pharynx: Oropharynx is clear.  Cardiovascular:     Rate and Rhythm: Normal rate and regular rhythm.     Heart sounds: Normal heart sounds.  Pulmonary:     Effort: Pulmonary effort is normal. No respiratory distress.     Breath sounds: Normal breath sounds.  Abdominal:     Palpations: Abdomen is soft.     Comments: G-tube intact, site tender  Neurological:     Mental Status: She is alert.     Imaging: CT ABDOMEN W CONTRAST Result Date: 03/27/2023 CLINICAL DATA:  Abdominal wall pain. Status post percutaneous gastrostomy tube placement 03/21/2023. EXAM: CT ABDOMEN WITH CONTRAST TECHNIQUE: Multidetector CT imaging of the abdomen was performed using the standard protocol following bolus administration of intravenous contrast. RADIATION DOSE REDUCTION: This exam was performed according to the departmental dose-optimization program which includes automated exposure control, adjustment of the mA and/or kV according to patient size and/or use of iterative reconstruction technique. CONTRAST:  75mL OMNIPAQUE IOHEXOL 350 MG/ML SOLN COMPARISON:  03/18/2023 FINDINGS: Lower chest: Dependent atelectasis. Hepatobiliary: No suspicious focal abnormality within the liver parenchyma. There is no evidence for gallstones, gallbladder wall thickening, or pericholecystic fluid. No intrahepatic or extrahepatic biliary dilation. Pancreas: No focal mass lesion. No dilatation of the main duct. No  intraparenchymal cyst. No peripancreatic edema. Spleen: No splenomegaly. No suspicious focal mass lesion. Adrenals/Urinary Tract: No adrenal nodule or mass. Kidneys unremarkable. Stomach/Bowel: Anterior wall the stomach is tented anteriorly by a percutaneous gastrostomy tube. There is edema in the left rectus sheath along the tract of the percutaneous nephrostomy tube with mild edema/fluid in the preperitoneal fat between the stomach and the anterior abdominal wall. Retention cap of the gastrostomy tube cannot be confirmed to be intraluminal on axial or sagittal imaging. There is a curvilinear radiopaque foreign body extending from the retention cap into the gastric lumen. Anterior wall the stomach in this region is thick with areas of apparent mural edema/fluid. There is apparent trace gas between the gastric wall in the retention cap which may be extraluminal. No small bowel dilatation within the visualized abdomen. Visualized segments of the ascending and transverse colon are distended with gas. Vascular/Lymphatic: No abdominal aortic aneurysm. No abdominal aortic atherosclerotic calcification. There is no gastrohepatic or hepatoduodenal ligament lymphadenopathy. No retroperitoneal or mesenteric lymphadenopathy. Other: There is some trace fluid adjacent to the liver and spleen. Musculoskeletal: No worrisome lytic or sclerotic osseous abnormality. IMPRESSION: 1. Mushroom retention cap of the percutaneous gastrostomy tube cannot be confirmed to be intraluminal on axial or sagittal imaging. There is a curvilinear radiopaque foreign body extending from the retention cap into the gastric lumen. Anterior wall the stomach in this region is thick with areas of apparent mural edema/fluid. There is apparent trace gas between the gastric wall and the retention cap which may be extraluminal. Further evaluation to confirm gastrostomy tube tip placement recommended. 2. Edema in the left rectus sheath along the tract of the  percutaneous gastrostomy tube with mild edema/fluid in the preperitoneal fat between the stomach and the anterior abdominal wall. These changes are not entirely unexpected 6 days after tube placement. 3. Trace fluid adjacent to the liver and spleen. Electronically Signed   By: Kennith Center M.D.   On: 03/27/2023 05:15   DG Abd Portable 1V Result Date: 03/26/2023 CLINICAL DATA:  Abdominal pain. EXAM: PORTABLE ABDOMEN - 1 VIEW COMPARISON:  03/21/2023 FINDINGS: Percutaneous gastrostomy tube overlies the medial left abdomen. There is diffuse gaseous distension of small bowel and colon with prominent stool visible in the right  colon and rectum. Visualized bony anatomy unremarkable. IMPRESSION: Diffuse gaseous distension of small bowel and colon with prominent stool in the right colon and rectum. Imaging features are most compatible with ileus. Electronically Signed   By: Kennith Center M.D.   On: 03/26/2023 09:31   DG Chest Port 1V same Day Result Date: 03/26/2023 CLINICAL DATA:  Fever. EXAM: PORTABLE CHEST 1 VIEW COMPARISON:  None Available. FINDINGS: The lungs are clear without focal pneumonia, edema, pneumothorax or pleural effusion. Cardiopericardial silhouette is at upper limits of normal for size. No acute bony abnormality. IMPRESSION: No active disease. Electronically Signed   By: Kennith Center M.D.   On: 03/26/2023 08:49   DG CHEST PORT 1 VIEW Result Date: 03/22/2023 CLINICAL DATA:  Fever EXAM: PORTABLE CHEST 1 VIEW COMPARISON:  None Available. FINDINGS: Lung volumes are small, but are stable since prior examination and are clear. No pneumothorax or pleural effusion. Cardiac size within normal limits. Pulmonary vascularity is normal. Nasoenteric feeding tube has been placed with its tip at the gastroesophageal junction. IMPRESSION: 1. Pulmonary hypoinflation. 2. Nasoenteric feeding tube tip at the gastroesophageal junction. Advancement is recommended. Electronically Signed   By: Helyn Numbers M.D.   On:  03/22/2023 01:38   IR GASTROSTOMY TUBE MOD SED Result Date: 03/21/2023 INDICATION: 23 year old female presents for gastrostomy tube placement EXAM: PERC PLACEMENT GASTROSTOMY MEDICATIONS: 2 g Ancef; Antibiotics were administered within 1 hour of the procedure. ANESTHESIA/SEDATION: Versed 1.0 mg IV; Fentanyl 50 mcg IV Moderate Sedation Time:  10 minutes The patient was continuously monitored during the procedure by the interventional radiology nurse under my direct supervision. CONTRAST:  15mL OMNIPAQUE IOHEXOL 300 MG/ML SOLN - administered into the gastric lumen. FLUOROSCOPY: Radiation Exposure Index (as provided by the fluoroscopic device): 8 mGy Kerma COMPLICATIONS: None PROCEDURE: Informed written consent was obtained from the patient and the patient's family after a thorough discussion of the procedural risks, benefits and alternatives. All questions were addressed. Maximal Sterile Barrier Technique was utilized including caps, mask, sterile gowns, sterile gloves, sterile drape, hand hygiene and skin antiseptic. A timeout was performed prior to the initiation of the procedure. The epigastrium was prepped with Betadine in a sterile fashion, and a sterile drape was applied covering the operative field. A sterile gown and sterile gloves were used for the procedure. A 5-French orogastric tube is placed under fluoroscopic guidance. Scout imaging of the abdomen confirms barium within the transverse colon. The stomach was distended with gas. Under fluoroscopic guidance, an 18 gauge needle was utilized to puncture the anterior wall of the body of the stomach. An Amplatz wire was advanced through the needle passing a T fastener into the lumen of the stomach. The T fastener was secured for gastropexy. A 9-French sheath was inserted. A snare was advanced through the 9-French sheath. A Teena Dunk was advanced through the orogastric tube. It was snared then pulled out the oral cavity, pulling the snare, as well. The leading  edge of the gastrostomy was attached to the snare. It was then pulled down the esophagus and out the percutaneous site. Tube secured in place. Contrast was injected. Patient tolerated the procedure well and remained hemodynamically stable throughout. No complications were encountered and no significant blood loss encountered. IMPRESSION: Status post fluoroscopic placed percutaneous gastrostomy tube, with 20 [place] pull-through. Signed, Yvone Neu. Miachel Roux, RPVI [facility] Electronically Signed   By: Gilmer Mor D.O.   On: 03/21/2023 16:47   DG Abd Portable 1V Result  Date: 03/21/2023 CLINICAL DATA:  Abdominal discomfort. EXAM: PORTABLE ABDOMEN - 1 VIEW COMPARISON:  March 03, 2023. FINDINGS: No abnormal bowel dilatation is noted. Feeding tube tip is seen in proximal stomach. Residual contrast is noted in nondilated colon. Moderate stool burden is noted. IMPRESSION: Moderate stool burden.  No abnormal bowel dilatation. Electronically Signed   By: Lupita Raider M.D.   On: 03/21/2023 09:56   CT ABDOMEN WO CONTRAST Result Date: 03/18/2023 CLINICAL DATA:  dysphagia.  Gastrostomy tube evaluation EXAM: CT ABDOMEN WITHOUT CONTRAST TECHNIQUE: Multidetector CT imaging of the abdomen was performed following the standard protocol without IV contrast. RADIATION DOSE REDUCTION: This exam was performed according to the departmental dose-optimization program which includes automated exposure control, adjustment of the mA and/or kV according to patient size and/or use of iterative reconstruction technique. COMPARISON:  KUB, 03/03/2023.  CTA chest, 04/08/2022. FINDINGS: Lower chest: No acute abnormality. Hepatobiliary: Normal noncontrast appearance of the liver without focal abnormality. No gallstones, gallbladder wall thickening, or biliary dilatation. Pancreas: No pancreatic ductal dilatation or surrounding inflammatory changes. Spleen: Normal in size  without focal abnormality. Adrenals/Urinary Tract: Adrenal glands are unremarkable. Kidneys are normal, without renal calculi, focal lesion, or hydronephrosis. Stomach/Bowel: Stomach is decompressed and within normal limits. Small bore enteric feeding tube, with tube tip within stomach. Interposition of the splenic flexure of the LEFT upper quadrant imaged portions of the small bowel and colon are nondilated. Appendix is outside the field of view on this evaluation. No evidence of bowel wall thickening, distention, or inflammatory changes. Vascular/Lymphatic: No significant vascular findings are present. No enlarged abdominal or pelvic lymph nodes. Other: No abdominal wall hernia or abnormality. Musculoskeletal: No acute osseous findings. IMPRESSION: 1. Gastric position of small-bore enteric feeding tube. Consider advancement if transpyloric positioning is desired. 2. Intervening viscera with the splenic flexure and distal transverse colon overlying the gastric body and antrum. Anatomy is equivocal for percutaneous gastrostomy placement, with a trial of contrast opacification of colon and insufflation recommended. Final decision making is reserved for the performing physician Roanna Banning, MD [facility] Radiology Electronically Signed   By: Roanna Banning M.D.   On: 03/18/2023 13:50   VAS [place] TRANSCRANIAL DOPPLER W BUBBLES Result Date: 03/11/2023  Transcranial Doppler with Bubble Patient Name:  [facility] A Doto  Date of Exam:   03/07/2023 Medical Rec #: [id number]         Accession #:    [id number] Date of Birth: 2000-11-27         Patient Gender: F Patient Age:   68 years Exam Location:  [facility] Procedure:      VAS [place] TRANSCRANIAL DOPPLER W BUBBLES Referring Phys: Lanae Boast --------------------------------------------------------------------------------  Indications: Stroke. Comparison Study: None. Performing Technologist: Shona Simpson  Examination  Guidelines: A complete evaluation includes B-mode imaging, spectral Doppler, color Doppler, and power Doppler as needed of all accessible portions of each vessel. Bilateral testing is considered an integral part of a complete examination. Limited examinations for reoccurring indications may be performed as noted.  Summary:  A vascular evaluation was performed. The left middle cerebral artery was studied. An IV was inserted into the patient's right Cephalic Vein. Verbal informed consent was obtained.  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  Diagnosing physician: Delia Heady MD Electronically signed by Delia Heady MD on 03/11/2023 at 4:46:47 PM.    Final  CT HEAD WO CONTRAST ( ) Result Date: 03/03/2023 CLINICAL DATA:  Stroke, follow up. EXAM: CT HEAD WITHOUT CONTRAST TECHNIQUE: Contiguous axial images were obtained from the base of the skull through the vertex without intravenous contrast. RADIATION DOSE REDUCTION: This exam was performed according to the departmental dose-optimization program which includes automated exposure control, adjustment of the mA and/or kV according to patient size and/or use of iterative reconstruction technique. COMPARISON:  Head CT 03/01/2023. FINDINGS: Brain: Continued evolution of the infarcts in the posterior left MCA territory and left ACA-MCA border zone. No acute hemorrhage or significant mass effect. Unchanged subcortical hypoattenuation deep to the right central sulcus. No hydrocephalus or extra-axial collection. No mass effect or midline shift. 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 Skull: No calvarial fracture or suspicious bone lesion. Skull base is unremarkable. Sinuses/Orbits: No acute finding. Other: None. IMPRESSION: 1. Continued evolution of the infarcts  in the posterior left MCA territory and left ACA-MCA border zone. No acute hemorrhage or significant mass effect. 2. 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 Electronically Signed   By: Orvan Falconer M.D.   On: 03/03/2023 16:57   DG Abd 1 View Result Date: 03/03/2023 CLINICAL DATA:  Abdominal pain EXAM: ABDOMEN - 1 VIEW COMPARISON:  02/28/2023 FINDINGS: Mild diffuse gaseous distention of the colon. No small bowel dilatation. No suspicious calcifications or mass effect. Unchanged position of Dobbhoff tube with tip located either within the distal stomach or proximal duodenum. IMPRESSION: 1. Mild diffuse gaseous distention of the colon, similar to prior examination. No small bowel dilatation to indicate ileus or obstruction. 2. Dobbhoff tube unchanged in position, terminating in the distal stomach or proximal duodenum. Electronically Signed   By: Acquanetta Belling M.D.   On: 03/03/2023 13:00   CT HEAD WO CONTRAST ( ) Result Date: 03/01/2023 CLINICAL DATA:  23 year old female status post code stroke presentation on 02/21/2023 with left MCA occlusion, infarcts. Evidence of progressive ischemia on MRI 02/27/2023. Subsequent encounter. EXAM: CT HEAD WITHOUT CONTRAST TECHNIQUE: Contiguous axial images were obtained from the base of the skull through the vertex without intravenous contrast. RADIATION DOSE REDUCTION: This exam was performed according to the departmental dose-optimization program which includes automated exposure control, adjustment of the mA and/or kV according to patient size and/or use of iterative reconstruction technique. COMPARISON:  Brain MRI 02/27/2023, head CT 02/26/2023, and earlier. FINDINGS: Brain: Confluent left hemisphere cytotoxic edema maximal in the middle and posterior left MCA territory is stable. Patchy additional anterior left MCA, right superior frontal gyrus infarcts are stable. No  hemorrhagic transformation. Intracranial mass effect with 2-3 mm of rightward midline shift is stable. No ventriculomegaly. Basilar cisterns are stable, remain patent. Stable noncontrast CT appearance of the brainstem and cerebellum. Vascular: Hyperdense left MCA on series 6, image 37. Skull: Intact, negative. Sinuses/Orbits: Visualized paranasal sinuses and mastoids are stable and well aerated. Other: Right nasoenteric tube in place. No acute orbit or scalp soft tissue finding. IMPRESSION: 1. Stable left greater than right MCA territory cytotoxic edema. No malignant hemorrhagic transformation. No new vascular territory involvement from the recent MRI. 2. Stable mild intracranial mass effect with 2-3 mm of rightward midline shift. 3. 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 Electronically Signed   By: Odessa Fleming M.D.   On: 03/01/2023 06:26   DG Abd Portable 1V Result Date: 02/28/2023  CLINICAL DATA:  Feeding tube placement EXAM: PORTABLE ABDOMEN - 1 VIEW COMPARISON:  X-ray earlier 02/28/2023. FINDINGS: Interval removal of the previous NG tube. Dobbhoff tube seen with the tip crossing to the right and could be along the extreme distal stomach or very proximal duodenum. Limited x-ray for tube placement IMPRESSION: Dobbhoff tube seen with the tip crossing to the right and could be along the extreme distal stomach or very proximal duodenum. Electronically Signed   By: Karen Kays M.D.   On: 02/28/2023 13:25   DG Abd 1 View Result Date: 02/28/2023 CLINICAL DATA:  NG tube placement EXAM: ABDOMEN - 1 VIEW COMPARISON:  Earlier 02/28/2023. FINDINGS: Limited x-ray for tube placement of the upper abdomen has a tip overlying the upper stomach but side hole above the GE junction. Recommend this be advanced further into the stomach. Minimal visualization of bowel loops in the upper abdomen. IMPRESSION: Limited x-ray for tube placement has tip overlying the upper stomach but side hole above the GE junction.  Recommend this be advanced further into the stomach. Electronically Signed   By: Karen Kays M.D.   On: 02/28/2023 13:24   DG Abd 1 View Result Date: 02/28/2023 CLINICAL DATA:  Feeding tube placement. EXAM: ABDOMEN - 1 VIEW COMPARISON:  Abdominal x-ray dated February 26, 2023. FINDINGS: Enteric tube tip just inside the stomach with the distal side port in the distal esophagus. Normal bowel gas pattern. No acute osseous abnormality. IMPRESSION: 1. Enteric tube tip just inside the stomach with the distal side port in the distal esophagus. Recommend advancement. Electronically Signed   By: Obie Dredge M.D.   On: 02/28/2023 10:41   MR BRAIN WO CONTRAST Result Date: 02/27/2023 CLINICAL DATA:  Stroke, follow-up EXAM: MRI HEAD WITHOUT CONTRAST TECHNIQUE: Multiplanar, multiecho pulse sequences of the brain and surrounding structures were obtained without intravenous contrast. COMPARISON:  02/22/2023 MRI head, 02/26/2023 CT head FINDINGS: Brain: Increased size of areas of restricted diffusion and additional new areas of involvement in the left posterior frontal and parietal lobes (series 2, image 39, compared to series 2, image 39 on the 02/22/2023 exam). Additional increased involvement is also noted in the anterior left frontal lobe (series 2, images 26-36), and right frontal lobe in the watershed territory (series 2, image 37). New restricted diffusion with ADC correlate in the left midbrain and cerebral peduncle, extending into the thalamus and posterior limb of the internal capsule (series 2, images 19-23). Additional punctate acute infarcts in the left occipital lobe (series 2, image 22), left frontal lobe (series 2, image 44), and medial left parietal lobe (series 2, images 40 and 42). These areas are associated with increased T2 hyperintense signal, with gyral swelling and associated mass effect from the larger areas in the left posterior frontal, left temporal, and left parietal lobe, but no more than 3  mm of left-to-right midline shift. On susceptibility weighted imaging, susceptibility in the left MCA is likely related to thrombus, with additional new involvement of a more posterior left MCA branch (series 7, image 59). More punctate foci of hemosiderin deposition are associated with posterior right frontal lobe infarct, left frontal lobe infarct, and posterior left temporal lobe infarct (series 7, images 49, 65, 71, and 72), which may reflect microhemorrhage versus thrombus. No evidence of hemorrhagic conversion. The basilar cisterns remain patent. Unchanged low lying cerebellar tonsils, which extend 2 mm past the foramen magnum, unchanged pituitary within normal limits. Suspected hypodensity on the 10/14/2023 CT head in the right frontal lobe and  right cerebellum was artifactual. Vascular: Normal proximal arterial flow voids. Loss of some of the more distal left MCA flow voids. Skull and upper cervical spine: Normal marrow signal. Sinuses/Orbits: Mucosal thickening in the maxillary sinuses and ethmoid air cells. No acute finding in the orbits. Other: Fluid in the left-greater-than-right mastoid air cells. IMPRESSION: 1. Increased acute infarcts in the left posterior frontal and parietal lobes, anterior left frontal lobe, and right frontal lobe, and additional punctate acute infarcts in the left occipital lobe, left frontal lobe, and medial left parietal lobe. 2. New restricted diffusion in the left midbrain, cerebral peduncle, thalamus, and posterior limb of the internal capsule may reflect early changes of wallerian degeneration. 3. Mildly increased mass effect from the larger areas in the left posterior frontal, left temporal, and left parietal lobe, with no more than 3 mm of left-to-right midline shift. 4. Susceptibility in the left MCA is concerning for thrombus, with additional new involvement of a more posterior left MCA branch. More punctate foci of hemosiderin deposition are associated with the  aforementioned infarcts, which may reflect microhemorrhage versus thromboemboli. No evidence of hemorrhagic conversion. These results will be called to the ordering clinician or representative by the Radiologist Assistant, and communication documented in the PACS or [employer]. Electronically Signed   By: Wiliam Ke M.D.   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 EE to evaluate for seizure Level of alertness: comatose/ lethargic AEDs during EEG study: VPA, Clonazepam Technical aspects: This EEG study was done with scalp electrodes positioned according to the 10-20 International system of electrode placement. Electrical activity was reviewed with band pass filter of 1-70Hz , sensitivity of 7 uV/mm, display speed of 73mm/sec with a 60Hz  notched filter applied as appropriate. EEG data were recorded continuously and digitally stored.  Video monitoring was available and reviewed as appropriate. Description: EEG showed continuous generalized 3 to 6 Hz theta-delta slowing with overriding 15 to 18 Hz beta activity distributed symmetrically and diffusely. Hyperventilation and photic stimulation were not performed.   ABNORMALITY - Continuous slow, generalized - Excessive beta, generalized IMPRESSION: This study is suggestive of severe diffuse encephalopathy likely related to sedation. No seizures or epileptiform discharges were seen throughout the recording. Priyanka Annabelle Harman   CT HEAD WO CONTRAST ( ) Result Date: 02/26/2023 CLINICAL DATA:  Stroke, follow-up EXAM: CT HEAD WITHOUT CONTRAST TECHNIQUE: Contiguous axial images were obtained from the base of the skull through the vertex without intravenous contrast. RADIATION DOSE  REDUCTION: This exam was performed according to the departmental dose-optimization program which includes automated exposure control, adjustment of the mA and/or kV according to patient size and/or use of iterative reconstruction technique. COMPARISON:  02/23/2023 CT head FINDINGS: Brain: Increased hypodensity in known infarcts in the posterior left MCA territory and right posterior frontal lobe (series 4, image 23), with increased area of hypodensity in the anterior left temporal lobe (series 4, image 17), which was previously a smaller area of hypodensity. New hypodensity suspected in the anterior inferior right frontal lobe (series 4, image 14 and series 6, image 21). Additional possible hypodensity in the right cerebellar hemisphere (series 4, image 8), although this area is prone to artifact. No definite acute hemorrhage. No mass or midline shift. No hydrocephalus.  Vascular: Redemonstrated hyperdense left M2 (series 4, image 11). No other hyperdense vessel. Skull: Negative for fracture or focal lesion. Sinuses/Orbits: No acute finding. Other: The mastoid air cells are well aerated. IMPRESSION: 1. 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 No evidence of frank hemorrhagic transformation. 2. New hypodensity suspected in the anterior inferior right frontal lobe and right cerebellum, which could represent additional acute infarct, although these areas are prone to artifact. Consider MRI for further evaluation. These results will be called to the ordering clinician or representative by the Radiologist Assistant, and communication documented in the PACS or [employer]. Electronically Signed   By: Wiliam Ke M.D.   On: 02/26/2023 23:40   DG Abd Portable 1V Result Date: 02/26/2023 CLINICAL DATA:  NG placement. EXAM: PORTABLE ABDOMEN - 1 VIEW COMPARISON:  Abdominal radiograph dated 02/24/2023. FINDINGS: Enteric tube  with tip in the epigastric area likely in the body of the stomach. No bowel dilatation noted in the visualized abdomen. IMPRESSION: Enteric tube with tip in the body of the stomach. Electronically Signed   By: Elgie Collard M.D.   On: 02/26/2023 15:40   VAS [place] LOWER EXTREMITY VENOUS (DVT) Result Date: 02/26/2023  Lower Venous DVT Study Patient Name:  [facility] A Finan  Date of Exam:   02/26/2023 Medical Rec #: [id number]         Accession #:    [id number] Date of Birth: [birth year]         Patient Gender: F Patient Age:   2 years Exam Location:  [facility] Procedure:      VAS [place] LOWER EXTREMITY VENOUS (DVT) Referring Phys: Dewitt Hoes DE LA TORRE --------------------------------------------------------------------------------  Indications: Stroke.  Risk Factors: Immobility past pregnancy. Limitations: Patient positioning. Comparison Study: No significant changes seen since prior exam 04/08/22 Performing Technologist: Shona Simpson  Examination Guidelines: A complete evaluation includes B-mode imaging, spectral Doppler, color Doppler, and power Doppler as needed of all accessible portions of each vessel. Bilateral testing is considered an integral part of a complete examination. Limited examinations for reoccurring indications may be performed as noted. The reflux portion of the exam is performed with the patient in reverse Trendelenburg.  +---------+---------------+---------+-----------+----------+--------------+ RIGHT    CompressibilityPhasicitySpontaneityPropertiesThrombus Aging +---------+---------------+---------+-----------+----------+--------------+ CFV      Full           Yes      Yes                                 +---------+---------------+---------+-----------+----------+--------------+ SFJ      Full                                                        +---------+---------------+---------+-----------+----------+--------------+ FV Prox  Full                                                         +---------+---------------+---------+-----------+----------+--------------+ FV Mid   Full                                                        +---------+---------------+---------+-----------+----------+--------------+  FV DistalFull                                                        +---------+---------------+---------+-----------+----------+--------------+ PFV      Full                                                        +---------+---------------+---------+-----------+----------+--------------+ POP      Full           Yes      Yes                                 +---------+---------------+---------+-----------+----------+--------------+ PTV      Full                                                        +---------+---------------+---------+-----------+----------+--------------+ PERO     Full                                                        +---------+---------------+---------+-----------+----------+--------------+   +---------+---------------+---------+-----------+----------+--------------+ LEFT     CompressibilityPhasicitySpontaneityPropertiesThrombus Aging +---------+---------------+---------+-----------+----------+--------------+ CFV      Full           Yes      Yes                                 +---------+---------------+---------+-----------+----------+--------------+ SFJ      Full                                                        +---------+---------------+---------+-----------+----------+--------------+ FV Prox  Full                                                        +---------+---------------+---------+-----------+----------+--------------+ FV Mid   Full                                                        +---------+---------------+---------+-----------+----------+--------------+ FV DistalFull                                                         +---------+---------------+---------+-----------+----------+--------------+  PFV      Full                                                        +---------+---------------+---------+-----------+----------+--------------+ POP      Full           Yes      Yes                                 +---------+---------------+---------+-----------+----------+--------------+ PTV      Full                                                        +---------+---------------+---------+-----------+----------+--------------+ PERO     Full                                                        +---------+---------------+---------+-----------+----------+--------------+     Summary: BILATERAL: - No evidence of deep vein thrombosis seen in the lower extremities, bilaterally. -No evidence of popliteal cyst, bilaterally.   *See table(s) above for measurements and observations. Electronically signed by Gerarda Fraction on 02/26/2023 at 11:56:19 AM.    Final     Labs:  CBC: Recent Labs    03/20/23 1047 03/21/23 0808 03/22/23 0619 03/26/23 0210  WBC 7.9 9.1 13.6* 12.8*  HGB 11.5* 12.0 11.1* 11.1*  HCT 34.6* 35.8* 31.7* 31.6*  PLT 91* 76* 77* 88*    COAGS: Recent Labs    02/21/23 1042 03/19/23 1651 03/20/23 0652 03/21/23 0808  INR 1.1  --   --  1.1  APTT 42* 82* >200*  --     BMP: Recent Labs    03/18/23 0518 03/19/23 0559 03/22/23 0619 03/26/23 0210  NA 138 134* 133* 129*  K 4.0 3.7 3.6 3.7  CL 104 102 104 99  CO2 23 23 20* 21*  GLUCOSE 108* 123* 133* 114*  BUN 17 19 13 13   CALCIUM 9.5 9.3 8.9 8.7*  CREATININE 0.80 0.76 0.85 0.73  GFRNONAA >60 >60 >60 >60    LIVER FUNCTION TESTS: Recent Labs    03/08/23 0634 03/10/23 0623 03/12/23 0557 03/19/23 0559 03/26/23 0210  BILITOT 0.4 0.4  --  0.5 0.4  AST 45* 34  --  34 44*  ALT 44 31  --  41 58*  ALKPHOS 79 76  --  77 97  PROT 7.5 7.3  --  7.3 7.3  ALBUMIN 2.9* 2.8* 2.7* 3.2* 2.7*     Assessment and Plan: S/p  perc G-tube 1/17 Retracted on CT Plan for image guided salvage/exchange Risks and benefits image guided gastrostomy tube placement was discussed with the patient and her mother including, but not limited to the need for a barium enema during the procedure, bleeding, infection, peritonitis and/or damage to adjacent structures.  All questions were answered, patient is agreeable to proceed.  Consent signed and in chart.   Electronically Signed: Brayton El, PA-C 03/27/2023, 9:44 AM  I spent a total of 20 minutes in face to face in clinical consultation, greater than 50% of which was counseling/coordinating care for G-tube exchange with sedation

## 2023-03-27 NOTE — Progress Notes (Signed)
PROGRESS NOTE   Subjective/Complaints: Patient is scheduled for image guided salvage/exchange for G-tube today by IR.  She is anxious about this procedure.  Transport came to take her for this procedure.  Hospitalist also came to see her this morning.   ROS: Limited by aphasia   Objective:   IR Replc Gastro/Colonic Tube Percut W/Fluoro Result Date: 03/27/2023 INDICATION: 23 year old female status post pull-through image guided gastrostomy tube placement on 03/20/2018 presenting with tube malfunction and pain in tree site with CT evidence of retraction into the anterior abdominal wall soft tissues. EXAM: FLUOROSCOPIC GUIDED REPLACEMENT OF GASTROSTOMY TUBE COMPARISON:  None Available. MEDICATIONS: None. CONTRAST:  15 mL Omnipaque 300 administered into the gastric lumen FLUOROSCOPY TIME:  Three mGy COMPLICATIONS: None immediate. PROCEDURE: Informed written consent was obtained from the patient after a discussion of the risks, benefits and alternatives to treatment. Questions regarding the procedure were encouraged and answered. A timeout was performed prior to the initiation of the procedure. The upper abdomen and external portion of the existing gastrostomy tube was prepped and draped in the usual sterile fashion, and a sterile drape was applied covering the operative field. Maximum barrier sterile technique with sterile gowns and gloves were used for the procedure. A timeout was performed prior to the initiation of the procedure. The existing gastrostomy tube was injected with a small amount of contrast which demonstrated a preserved track into the gastric lumen. The existing gastrostomy tube was cannulated with a stiff Glidewire which was coiled within the gastric fundus. Over the stiff guide wire, the existing gastrostomy tube was removed and exchanged for a new 20-French MIC balloon inflatable gastrostomy tube. The balloon was inflated and disc  was cinched. Contrast was injected and several spot fluoroscopic images were obtained in various obliquities to confirm appropriate intraluminal positioning. A dressing was placed. The patient tolerated the procedure well without immediate postprocedural complication. IMPRESSION: Successful fluoroscopic guided replacement of a new 20-French gastrostomy tube. The new gastrostomy tube is ready for immediate use. PLAN: Return in 6 months for routine gastrostomy tube exchange. Marliss Coots, MD [facility] Electronically Signed   By: Marliss Coots M.D.   On: 03/27/2023 12:36   CT ABDOMEN W CONTRAST Result Date: 03/27/2023 CLINICAL DATA:  Abdominal wall pain. Status post percutaneous gastrostomy tube placement 03/21/2023. EXAM: CT ABDOMEN WITH CONTRAST TECHNIQUE: Multidetector CT imaging of the abdomen was performed using the standard protocol following bolus administration of intravenous contrast. RADIATION DOSE REDUCTION: This exam was performed according to the departmental dose-optimization program which includes automated exposure control, adjustment of the mA and/or kV according to patient size and/or use of iterative reconstruction technique. CONTRAST:  75mL OMNIPAQUE IOHEXOL 350 MG/ML SOLN COMPARISON:  03/18/2023 FINDINGS: Lower chest: Dependent atelectasis. Hepatobiliary: No suspicious focal abnormality within the liver parenchyma. There is no evidence for gallstones, gallbladder wall thickening, or pericholecystic fluid. No intrahepatic or extrahepatic biliary dilation. Pancreas: No focal mass lesion. No dilatation of the main duct. No intraparenchymal cyst. No peripancreatic edema. Spleen: No splenomegaly. No suspicious focal mass lesion. Adrenals/Urinary Tract: No adrenal nodule or mass. Kidneys unremarkable. Stomach/Bowel: Anterior wall the stomach is tented anteriorly by a percutaneous  gastrostomy tube. There is edema in the left rectus sheath  along the tract of the percutaneous nephrostomy tube with mild edema/fluid in the preperitoneal fat between the stomach and the anterior abdominal wall. Retention cap of the gastrostomy tube cannot be confirmed to be intraluminal on axial or sagittal imaging. There is a curvilinear radiopaque foreign body extending from the retention cap into the gastric lumen. Anterior wall the stomach in this region is thick with areas of apparent mural edema/fluid. There is apparent trace gas between the gastric wall in the retention cap which may be extraluminal. No small bowel dilatation within the visualized abdomen. Visualized segments of the ascending and transverse colon are distended with gas. Vascular/Lymphatic: No abdominal aortic aneurysm. No abdominal aortic atherosclerotic calcification. There is no gastrohepatic or hepatoduodenal ligament lymphadenopathy. No retroperitoneal or mesenteric lymphadenopathy. Other: There is some trace fluid adjacent to the liver and spleen. Musculoskeletal: No worrisome lytic or sclerotic osseous abnormality. IMPRESSION: 1. Mushroom retention cap of the percutaneous gastrostomy tube cannot be confirmed to be intraluminal on axial or sagittal imaging. There is a curvilinear radiopaque foreign body extending from the retention cap into the gastric lumen. Anterior wall the stomach in this region is thick with areas of apparent mural edema/fluid. There is apparent trace gas between the gastric wall and the retention cap which may be extraluminal. Further evaluation to confirm gastrostomy tube tip placement recommended. 2. Edema in the left rectus sheath along the tract of the percutaneous gastrostomy tube with mild edema/fluid in the preperitoneal fat between the stomach and the anterior abdominal wall. These changes are not entirely unexpected 6 days after tube placement. 3. Trace fluid adjacent to the liver and spleen. Electronically Signed   By: Kennith Center M.D.   On: 03/27/2023 05:15    DG Abd Portable 1V Result Date: 03/26/2023 CLINICAL DATA:  Abdominal pain. EXAM: PORTABLE ABDOMEN - 1 VIEW COMPARISON:  03/21/2023 FINDINGS: Percutaneous gastrostomy tube overlies the medial left abdomen. There is diffuse gaseous distension of small bowel and colon with prominent stool visible in the right colon and rectum. Visualized bony anatomy unremarkable. IMPRESSION: Diffuse gaseous distension of small bowel and colon with prominent stool in the right colon and rectum. Imaging features are most compatible with ileus. Electronically Signed   By: Kennith Center M.D.   On: 03/26/2023 09:31   DG Chest Port 1V same Day Result Date: 03/26/2023 CLINICAL DATA:  Fever. EXAM: PORTABLE CHEST 1 VIEW COMPARISON:  None Available. FINDINGS: The lungs are clear without focal pneumonia, edema, pneumothorax or pleural effusion. Cardiopericardial silhouette is at upper limits of normal for size. No acute bony abnormality. IMPRESSION: No active disease. Electronically Signed   By: Kennith Center M.D.   On: 03/26/2023 08:49   Recent Labs    03/26/23 0210  WBC 12.8*  HGB 11.1*  HCT 31.6*  PLT 88*   Recent Labs    03/26/23 0210  NA 129*  K 3.7  CL 99  CO2 21*  GLUCOSE 114*  BUN 13  CREATININE 0.73  CALCIUM 8.7*    Intake/Output Summary (Last 24 hours) at 03/27/2023 1347 Last data filed at 03/26/2023 2015 Gross per 24 hour  Intake 1509.58 ml  Output 0 ml  Net 1509.58 ml        Physical Exam: Vital Signs Blood pressure 127/79, pulse (!) 109, temperature 99.4 F (37.4 C), resp. rate 19, height 5\' 4"  (1.626 m), weight 77.1 kg, SpO2 100%, unknown if currently breastfeeding.   General: NAD HEENT:  Head is normocephalic, atraumatic, mucous membranes moist Neck: Supple without JVD or lymphadenopathy Heart: Reg rate and rhythm. No murmurs rubs or gallops Chest: CTA bilaterally without wheezes, rales, or rhonchi; no distress Abdomen: Positive bowel sounds, abdomen is soft, positive abdominal  tenderness around G-tube.  Urostomy tube in place. Extremities: No clubbing, cyanosis, or edema. Pulses are 2+ Psych: Appears very anxious tearful related to her scheduled procedure. Skin: Clean and intact without signs of breakdown Musculoskeletal:     Cervical back: Normal range of motion and neck supple.     Comments: LUE/LLE moving well again gravity- couldn't complete testing due to perseveration RUE- no voluntary movement seen of RUE or RLE even to tactile stimuli     Neurological:     Comments: Patient came back from working with therapy.  Awake and alert.  She does make eye contact with examiner.  Patient is aphasic but does have some spontaneous simple word responses.  MAS of 3-4 in R shoulder esp External rotation; MAS of 4 in R elbow- lacking 45 degrees of extension with a lot of work; MAS of 2-3 of R hand- in fist at rest, but could completely open fingers and get past wrist neutral.  Difficult to assess sensation      Assessment/Plan: 1. Functional deficits which require 3+ hours per day of interdisciplinary therapy in a comprehensive inpatient rehab setting. Physiatrist is providing close team supervision and 24 hour management of active medical problems listed below. Physiatrist and rehab team continue to assess barriers to discharge/monitor patient progress toward functional and medical goals  Care Tool:  Bathing    Body parts bathed by patient: Right arm, Chest, Abdomen, Front perineal area   Body parts bathed by helper: Left arm, Buttocks, Right upper leg, Left upper leg, Right lower leg, Left lower leg, Face     Bathing assist Assist Level: Maximal Assistance - Patient 24 - 49%     Upper Body Dressing/Undressing Upper body dressing   What is the patient wearing?: Pull over shirt    Upper body assist Assist Level: Maximal Assistance - Patient 25 - 49%    Lower Body Dressing/Undressing Lower body dressing      What is the patient wearing?: Incontinence  brief, Pants     Lower body assist Assist for lower body dressing: Total Assistance - Patient < 25%     Toileting Toileting    Toileting assist Assist for toileting: 2 Helpers     Transfers Chair/bed transfer  Transfers assist  Chair/bed transfer activity did not occur: Safety/medical concerns (unsafe to get up)  Chair/bed transfer assist level: 2 Helpers     Locomotion Ambulation   Ambulation assist      Assist level: 2 helpers (+3 assist) Assistive device: Other (comment) (3 muskateer assist + chair follow) Max distance: 37ft   Walk 10 feet activity   Assist     Assist level: 2 helpers Assistive device: Other (comment) (3 muskateer assist + chair follow)   Walk 50 feet activity   Assist Walk 50 feet with 2 turns activity did not occur: Safety/medical concerns         Walk 150 feet activity   Assist Walk 150 feet activity did not occur: Safety/medical concerns         Walk 10 feet on uneven surface  activity   Assist Walk 10 feet on uneven surfaces activity did not occur: Safety/medical concerns         Wheelchair  Assist Is the patient using a wheelchair?: Yes Type of Wheelchair: Manual Wheelchair activity did not occur: Safety/medical concerns         Wheelchair 50 feet with 2 turns activity    Assist    Wheelchair 50 feet with 2 turns activity did not occur: Safety/medical concerns       Wheelchair 150 feet activity     Assist  Wheelchair 150 feet activity did not occur: Safety/medical concerns       Blood pressure 127/79, pulse (!) 109, temperature 99.4 F (37.4 C), resp. rate 19, height 5\' 4"  (1.626 m), weight 77.1 kg, SpO2 100%, unknown if currently breastfeeding.   Medical Problem List and Plan: 1. 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  2.  Antithrombotics: -DVT/anticoagulation:  Pharmaceutical: Eliquis             -antiplatelet therapy: N/A 3. Pain Management: Neurontin 100 mg 3 times daily, Topamax 25 mg twice daily, oxycodone 5 mg every 6 hours as needed 4. Mood/Behavior/Sleep: Klonopin 0.5 mg nightly, melatonin 5 mg nightly as needed             -antipsychotic agents: suggest Depakote or increase in gabapentin to help calm pt down;  5. Neuropsych/cognition: This patient is not capable of making decisions on her own behalf. 6. Skin/Wound Care: routine skin care for PEG and overall skin 7. Fluids/Electrolytes/Nutrition: will assess labs and recheck labs in AM 8.  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  Followed by oncology/hematology  9.  Anemia/thrombocytopenia/iron deficiency.  Follow-up CBC.  Continue Niferex  -Hemoglobin stable 11.1, platelets 88K 10.  Dysphagia.  Status post gastrostomy tube 03/21/2023 per interventional radiology.  Diet has been advanced to regular.  Dietary follow-up- still on TF's because not eating well.  11.  E. coli UTI.  Antibiotic therapy completed 12.  Tobacco/alcohol use.  Counseling 13. Severe RUE spasticity- would strongly suggest Botox of RUE soon as well as resting hand splint for R wrist and R PRAFO- will order.  14. Abd pain- from PEG vs constipation- if no BM, might need KUB.   -IR consult to assess Gtube site.  Pus noted at skin site. Appears CT scan is ordered  -Likely G-tube infection.  She is going for a IR procedure today.  Hospitalist team following-appreciate assistance.  She has been started on vancomycin and ceftriaxone for abdominal infection.  Echocardiogram and cultures have been ordered.  Spoke with hospitalist today, ID has been consulted.  15.  Elevated  transaminases-mild  -Possibly reactive, continue to monitor 16.  Hyponatremia  -Currently on IVF, hospitalist consulted  -Will DC free water per tube   -Hospitalist team following.  Recheck labs today 17.  Ileus  -Hospitalist consulted, continue IVF, discontinued tube feeds  -1/23 improving.  She had bowel movements today and yesterday      LOS: 2 days A FACE TO FACE EVALUATION WAS PERFORMED  Fanny Dance 03/27/2023, 1:47 PM

## 2023-03-28 ENCOUNTER — Inpatient Hospital Stay (HOSPITAL_COMMUNITY): Payer: Medicaid Other

## 2023-03-28 DIAGNOSIS — E876 Hypokalemia: Secondary | ICD-10-CM

## 2023-03-28 LAB — CBC WITH DIFFERENTIAL/PLATELET
Abs Immature Granulocytes: 0 10*3/uL (ref 0.00–0.07)
Basophils Absolute: 0 10*3/uL (ref 0.0–0.1)
Basophils Relative: 0 %
Eosinophils Absolute: 0.1 10*3/uL (ref 0.0–0.5)
Eosinophils Relative: 2 %
HCT: 25.8 % — ABNORMAL LOW (ref 36.0–46.0)
Hemoglobin: 8.9 g/dL — ABNORMAL LOW (ref 12.0–15.0)
Lymphocytes Relative: 14 %
Lymphs Abs: 0.7 10*3/uL (ref 0.7–4.0)
MCH: 27.1 pg (ref 26.0–34.0)
MCHC: 34.5 g/dL (ref 30.0–36.0)
MCV: 78.4 fL — ABNORMAL LOW (ref 80.0–100.0)
Monocytes Absolute: 0.4 10*3/uL (ref 0.1–1.0)
Monocytes Relative: 7 %
Neutro Abs: 3.9 10*3/uL (ref 1.7–7.7)
Neutrophils Relative %: 77 %
Platelets: 79 10*3/uL — ABNORMAL LOW (ref 150–400)
RBC: 3.29 MIL/uL — ABNORMAL LOW (ref 3.87–5.11)
RDW: 21.2 % — ABNORMAL HIGH (ref 11.5–15.5)
WBC: 5 10*3/uL (ref 4.0–10.5)
nRBC: 0 % (ref 0.0–0.2)
nRBC: 0 /100{WBCs}

## 2023-03-28 LAB — COMPREHENSIVE METABOLIC PANEL
ALT: 92 U/L — ABNORMAL HIGH (ref 0–44)
AST: 65 U/L — ABNORMAL HIGH (ref 15–41)
Albumin: 2.3 g/dL — ABNORMAL LOW (ref 3.5–5.0)
Alkaline Phosphatase: 91 U/L (ref 38–126)
Anion gap: 9 (ref 5–15)
BUN: 8 mg/dL (ref 6–20)
CO2: 23 mmol/L (ref 22–32)
Calcium: 8.8 mg/dL — ABNORMAL LOW (ref 8.9–10.3)
Chloride: 108 mmol/L (ref 98–111)
Creatinine, Ser: 0.79 mg/dL (ref 0.44–1.00)
GFR, Estimated: >60 mL/min (ref >60)
Glucose, Bld: 92 mg/dL (ref 70–99)
Potassium: 3.3 mmol/L — ABNORMAL LOW (ref 3.5–5.1)
Sodium: 140 mmol/L (ref 135–145)
Total Bilirubin: 0.6 mg/dL (ref 0.0–1.2)
Total Protein: 6.7 g/dL (ref 6.5–8.1)

## 2023-03-28 MED ORDER — VITAMIN B-12 1000 MCG PO TABS
2000.0000 ug | ORAL_TABLET | Freq: Every day | ORAL | Status: DC
  Administered 2023-03-29 – 2023-04-23 (×26): 2000 ug via ORAL
  Filled 2023-03-28 (×28): qty 2

## 2023-03-28 MED ORDER — OXYCODONE HCL 5 MG PO TABS
5.0000 mg | ORAL_TABLET | ORAL | Status: DC | PRN
  Administered 2023-03-28 – 2023-04-02 (×19): 5 mg
  Filled 2023-03-28 (×21): qty 1

## 2023-03-28 MED ORDER — POTASSIUM CHLORIDE CRYS ER 20 MEQ PO TBCR: 30.0000 meq | EXTENDED_RELEASE_TABLET | Freq: Two times a day (BID) | ORAL | Status: DC

## 2023-03-28 MED ORDER — ADULT MULTIVITAMIN W/MINERALS CH
1.0000 | ORAL_TABLET | Freq: Every day | ORAL | Status: DC
  Administered 2023-03-29 – 2023-04-01 (×4): 1
  Filled 2023-03-28 (×5): qty 1

## 2023-03-28 MED ORDER — SIMETHICONE 40 MG/0.6ML PO SUSP
40.0000 mg | Freq: Four times a day (QID) | ORAL | Status: DC
  Administered 2023-03-28 – 2023-03-31 (×12): 40 mg
  Filled 2023-03-28 (×21): qty 0.6

## 2023-03-28 MED ORDER — ADULT MULTIVITAMIN W/MINERALS CH: 1.0000 | ORAL_TABLET | Freq: Every day | ORAL | Status: DC

## 2023-03-28 MED ORDER — HYDROXYZINE HCL 25 MG PO TABS: 25.0000 mg | ORAL_TABLET | Freq: Three times a day (TID) | ORAL | Status: DC | PRN

## 2023-03-28 MED ORDER — POLYETHYLENE GLYCOL 3350 17 G PO PACK: 17.0000 g | PACK | Freq: Every day | ORAL | Status: DC

## 2023-03-28 MED ORDER — OSMOLITE 1.5 CAL PO LIQD
480.0000 mL | ORAL | Status: DC
  Administered 2023-03-28 – 2023-03-30 (×3): 480 mL
  Filled 2023-03-28: qty 711

## 2023-03-28 MED ORDER — APIXABAN 5 MG PO TABS: 5.0000 mg | ORAL_TABLET | Freq: Two times a day (BID) | ORAL | Status: DC

## 2023-03-28 MED ORDER — OXYCODONE HCL 5 MG PO TABS
5.0000 mg | ORAL_TABLET | ORAL | Status: DC | PRN
  Administered 2023-03-28: 5 mg via ORAL
  Filled 2023-03-28: qty 1

## 2023-03-28 MED ORDER — POLYETHYLENE GLYCOL 3350 17 G PO PACK
17.0000 g | PACK | Freq: Two times a day (BID) | ORAL | Status: DC
Start: 2023-03-28 — End: 2023-04-02
  Administered 2023-03-28 – 2023-03-30 (×5): 17 g
  Filled 2023-03-28 (×8): qty 1

## 2023-03-28 MED ORDER — FOLIC ACID 1 MG PO TABS
1.0000 mg | ORAL_TABLET | Freq: Every day | ORAL | Status: DC
  Administered 2023-03-29 – 2023-04-23 (×26): 1 mg via ORAL
  Filled 2023-03-28 (×27): qty 1

## 2023-03-28 MED ORDER — SORBITOL 70 % SOLN
30.0000 mL | Freq: Every day | Status: DC | PRN
  Administered 2023-04-16: 30 mL via ORAL
  Filled 2023-03-28 (×2): qty 30

## 2023-03-28 MED ORDER — TOPIRAMATE 25 MG PO TABS: 25.0000 mg | ORAL_TABLET | Freq: Two times a day (BID) | ORAL | Status: DC

## 2023-03-28 MED ORDER — PROSOURCE TF20 ENFIT COMPATIBL EN LIQD
60.0000 mL | Freq: Two times a day (BID) | ENTERAL | Status: DC
  Administered 2023-03-28 – 2023-03-31 (×6): 60 mL
  Filled 2023-03-28 (×6): qty 60

## 2023-03-28 MED ORDER — POLYSACCHARIDE IRON COMPLEX 150 MG PO CAPS
150.0000 mg | ORAL_CAPSULE | Freq: Every day | ORAL | Status: DC
  Administered 2023-03-29 – 2023-04-01 (×4): 150 mg
  Filled 2023-03-28 (×5): qty 1

## 2023-03-28 MED ORDER — POLYSACCHARIDE IRON COMPLEX 150 MG PO CAPS: 150.0000 mg | ORAL_CAPSULE | Freq: Every day | ORAL | Status: DC

## 2023-03-28 MED ORDER — FLEET ENEMA RE ENEM
1.0000 | ENEMA | Freq: Once | RECTAL | Status: AC
  Administered 2023-03-28: 1 via RECTAL
  Filled 2023-03-28: qty 1

## 2023-03-28 MED ORDER — APIXABAN 5 MG PO TABS
5.0000 mg | ORAL_TABLET | Freq: Two times a day (BID) | ORAL | Status: DC
  Administered 2023-03-28 – 2023-04-01 (×9): 5 mg
  Filled 2023-03-28 (×10): qty 1

## 2023-03-28 MED ORDER — IOHEXOL 300 MG/ML  SOLN: 50.0000 mL | Freq: Once | INTRAMUSCULAR | Status: DC | PRN

## 2023-03-28 MED ORDER — TOPIRAMATE 25 MG PO TABS
25.0000 mg | ORAL_TABLET | Freq: Two times a day (BID) | ORAL | Status: DC
  Administered 2023-03-28 – 2023-04-01 (×9): 25 mg
  Filled 2023-03-28 (×10): qty 1

## 2023-03-28 MED ORDER — POTASSIUM CHLORIDE 20 MEQ PO PACK
20.0000 meq | PACK | ORAL | Status: AC
  Administered 2023-03-28 – 2023-03-29 (×3): 20 meq
  Filled 2023-03-28 (×3): qty 1

## 2023-03-28 MED ORDER — FREE WATER
200.0000 mL | Status: DC
  Administered 2023-03-28 – 2023-03-31 (×18): 200 mL

## 2023-03-28 MED ORDER — DOCUSATE SODIUM 50 MG/5ML PO LIQD
100.0000 mg | Freq: Two times a day (BID) | ORAL | Status: DC
  Administered 2023-03-28 – 2023-03-29 (×2): 100 mg
  Filled 2023-03-28 (×2): qty 10

## 2023-03-28 MED ORDER — HYDROXYZINE HCL 25 MG PO TABS
25.0000 mg | ORAL_TABLET | Freq: Three times a day (TID) | ORAL | Status: DC | PRN
  Administered 2023-03-29 – 2023-03-31 (×4): 25 mg
  Filled 2023-03-28 (×4): qty 1

## 2023-03-28 NOTE — Plan of Care (Signed)
  Problem: RH PAIN MANAGEMENT Goal: RH STG PAIN MANAGED AT OR BELOW PT'S PAIN GOAL Description: <4w/ prns Outcome: Progressing   Problem: RH KNOWLEDGE DEFICIT Goal: RH STG INCREASE KNOWLEDGE OF STROKE PROPHYLAXIS Description: Manage stroke prophylaxis with min A Outcome: Progressing

## 2023-03-28 NOTE — Progress Notes (Signed)
Brief ID note:  Blood cultures positive, contaminate vs infection.  Had a fever and leukocytosis, now improved.    Likely transient. Plan to treat for 7 days total.  Continue with vancomycin and can use linezolid if she is discharged prior to that.   Will repeat the blood cultures to assure clearance.  Otherwise will sign off, call with any questions.   Gardiner Barefoot, MD

## 2023-03-28 NOTE — Progress Notes (Signed)
PROGRESS NOTE   Subjective/Complaints: Patient indicates she is very hungry.  She is very frustrated that she cannot eat.  ROS: Limited by aphasia   Objective:   DG Abd 1 View Result Date: 03/28/2023 CLINICAL DATA:  Ileus EXAM: ABDOMEN - 1 VIEW portable COMPARISON:  03/28/23. FINDINGS: G tube overlies the stomach. Moderate stool in the distended right side of the colon. There is air in other loops of bowel diffusely. No obstruction or free air. Appearance is similar previous. IMPRESSION: No significant interval change. Electronically Signed   By: Karen Kays M.D.   On: 03/28/2023 14:41   IR Replc Gastro/Colonic Tube Percut W/Fluoro Result Date: 03/27/2023 INDICATION: 23 year old female status post pull-through image guided gastrostomy tube placement on 03/20/2018 presenting with tube malfunction and pain in tree site with CT evidence of retraction into the anterior abdominal wall soft tissues. EXAM: FLUOROSCOPIC GUIDED REPLACEMENT OF GASTROSTOMY TUBE COMPARISON:  None Available. MEDICATIONS: None. CONTRAST:  15 mL Omnipaque 300 administered into the gastric lumen FLUOROSCOPY TIME:  Three mGy COMPLICATIONS: None immediate. PROCEDURE: Informed written consent was obtained from the patient after a discussion of the risks, benefits and alternatives to treatment. Questions regarding the procedure were encouraged and answered. A timeout was performed prior to the initiation of the procedure. The upper abdomen and external portion of the existing gastrostomy tube was prepped and draped in the usual sterile fashion, and a sterile drape was applied covering the operative field. Maximum barrier sterile technique with sterile gowns and gloves were used for the procedure. A timeout was performed prior to the initiation of the procedure. The existing gastrostomy tube was injected with a small amount of contrast which demonstrated a preserved track into the  gastric lumen. The existing gastrostomy tube was cannulated with a stiff Glidewire which was coiled within the gastric fundus. Over the stiff guide wire, the existing gastrostomy tube was removed and exchanged for a new 20-French MIC balloon inflatable gastrostomy tube. The balloon was inflated and disc was cinched. Contrast was injected and several spot fluoroscopic images were obtained in various obliquities to confirm appropriate intraluminal positioning. A dressing was placed. The patient tolerated the procedure well without immediate postprocedural complication. IMPRESSION: Successful fluoroscopic guided replacement of a new 20-French gastrostomy tube. The new gastrostomy tube is ready for immediate use. PLAN: Return in 6 months for routine gastrostomy tube exchange. Marliss Coots, MD [facility] Electronically Signed   By: Marliss Coots M.D.   On: 03/27/2023 12:36   CT ABDOMEN W CONTRAST Result Date: 03/27/2023 CLINICAL DATA:  Abdominal wall pain. Status post percutaneous gastrostomy tube placement 03/21/2023. EXAM: CT ABDOMEN WITH CONTRAST TECHNIQUE: Multidetector CT imaging of the abdomen was performed using the standard protocol following bolus administration of intravenous contrast. RADIATION DOSE REDUCTION: This exam was performed according to the departmental dose-optimization program which includes automated exposure control, adjustment of the mA and/or kV according to patient size and/or use of iterative reconstruction technique. CONTRAST:  75mL OMNIPAQUE IOHEXOL 350 MG/ML SOLN COMPARISON:  03/18/2023 FINDINGS: Lower chest: Dependent atelectasis. Hepatobiliary: No suspicious focal abnormality within the liver parenchyma. There is no evidence for gallstones, gallbladder wall thickening, or pericholecystic  fluid. No intrahepatic or extrahepatic biliary dilation. Pancreas: No focal mass lesion. No dilatation of the main duct. No intraparenchymal  cyst. No peripancreatic edema. Spleen: No splenomegaly. No suspicious focal mass lesion. Adrenals/Urinary Tract: No adrenal nodule or mass. Kidneys unremarkable. Stomach/Bowel: Anterior wall the stomach is tented anteriorly by a percutaneous gastrostomy tube. There is edema in the left rectus sheath along the tract of the percutaneous nephrostomy tube with mild edema/fluid in the preperitoneal fat between the stomach and the anterior abdominal wall. Retention cap of the gastrostomy tube cannot be confirmed to be intraluminal on axial or sagittal imaging. There is a curvilinear radiopaque foreign body extending from the retention cap into the gastric lumen. Anterior wall the stomach in this region is thick with areas of apparent mural edema/fluid. There is apparent trace gas between the gastric wall in the retention cap which may be extraluminal. No small bowel dilatation within the visualized abdomen. Visualized segments of the ascending and transverse colon are distended with gas. Vascular/Lymphatic: No abdominal aortic aneurysm. No abdominal aortic atherosclerotic calcification. There is no gastrohepatic or hepatoduodenal ligament lymphadenopathy. No retroperitoneal or mesenteric lymphadenopathy. Other: There is some trace fluid adjacent to the liver and spleen. Musculoskeletal: No worrisome lytic or sclerotic osseous abnormality. IMPRESSION: 1. Mushroom retention cap of the percutaneous gastrostomy tube cannot be confirmed to be intraluminal on axial or sagittal imaging. There is a curvilinear radiopaque foreign body extending from the retention cap into the gastric lumen. Anterior wall the stomach in this region is thick with areas of apparent mural edema/fluid. There is apparent trace gas between the gastric wall and the retention cap which may be extraluminal. Further evaluation to confirm gastrostomy tube tip placement recommended. 2. Edema in the left rectus sheath along the tract of the percutaneous  gastrostomy tube with mild edema/fluid in the preperitoneal fat between the stomach and the anterior abdominal wall. These changes are not entirely unexpected 6 days after tube placement. 3. Trace fluid adjacent to the liver and spleen. Electronically Signed   By: Kennith Center M.D.   On: 03/27/2023 05:15   Recent Labs    03/27/23 1404 03/28/23 1311  WBC 9.9 5.0  HGB 9.7* 8.9*  HCT 27.5* 25.8*  PLT 72* 79*   Recent Labs    03/27/23 1404 03/28/23 1311  NA 134* 140  K 3.3* 3.3*  CL 105 108  CO2 18* 23  GLUCOSE 92 92  BUN 11 8  CREATININE 0.81 0.79  CALCIUM 8.6* 8.8*    Intake/Output Summary (Last 24 hours) at 03/28/2023 1538 Last data filed at 03/28/2023 0400 Gross per 24 hour  Intake 3442.6 ml  Output --  Net 3442.6 ml        Physical Exam: Vital Signs Blood pressure 100/73, pulse 84, temperature 98.6 F (37 C), temperature source Oral, resp. rate 17, height 5\' 4"  (1.626 m), weight 77.1 kg, SpO2 100%, unknown if currently breastfeeding.   General: NAD HEENT: Head is normocephalic, atraumatic, mucous membranes moist Neck: Supple without JVD or lymphadenopathy Heart: Reg rate and rhythm. No murmurs rubs or gallops Chest: CTA bilaterally without wheezes, rales, or rhonchi; no distress Abdomen: Positive bowel sounds, abdomen is soft, positive abdominal tenderness around G-tube.  Urostomy tube in place. Extremities: No clubbing, cyanosis, or edema. Pulses are 2+ Psych: Appears frustrated Skin: Clean and intact without signs of breakdown Musculoskeletal:     Cervical back: Normal range of motion and neck supple.     Comments: LUE/LLE moving well again gravity RUE-  no voluntary movement seen of RUE or RLE   Neurological:     Comments: Patient came back from working with therapy.  Awake and alert.  She does make eye contact with examiner.  Increased verbal output today. MAS of 3-4 in R shoulder esp External rotation; MAS of 4 in R elbow- lacking 45 degrees of extension  with a lot of work; MAS of 2-3 of R hand- in fist at rest, but could completely open fingers and get past wrist neutral.  Difficult to assess sensation      Assessment/Plan: 1. Functional deficits which require 3+ hours per day of interdisciplinary therapy in a comprehensive inpatient rehab setting. Physiatrist is providing close team supervision and 24 hour management of active medical problems listed below. Physiatrist and rehab team continue to assess barriers to discharge/monitor patient progress toward functional and medical goals  Care Tool:  Bathing    Body parts bathed by patient: Right arm, Chest, Abdomen, Front perineal area   Body parts bathed by helper: Left arm, Buttocks, Right upper leg, Left upper leg, Right lower leg, Left lower leg, Face     Bathing assist Assist Level: Maximal Assistance - Patient 24 - 49%     Upper Body Dressing/Undressing Upper body dressing   What is the patient wearing?: Pull over shirt    Upper body assist Assist Level: Maximal Assistance - Patient 25 - 49%    Lower Body Dressing/Undressing Lower body dressing      What is the patient wearing?: Incontinence brief, Pants     Lower body assist Assist for lower body dressing: Total Assistance - Patient < 25%     Toileting Toileting    Toileting assist Assist for toileting: 2 Helpers     Transfers Chair/bed transfer  Transfers assist  Chair/bed transfer activity did not occur: Safety/medical concerns (unsafe to get up)  Chair/bed transfer assist level: 2 Helpers     Locomotion Ambulation   Ambulation assist      Assist level: 2 helpers Assistive device: Other (comment) (3 muskateer assist + chair follow) Max distance: 30'   Walk 10 feet activity   Assist     Assist level: 2 helpers Assistive device: Other (comment) (Rail)   Walk 50 feet activity   Assist Walk 50 feet with 2 turns activity did not occur: Safety/medical concerns         Walk 150 feet  activity   Assist Walk 150 feet activity did not occur: Safety/medical concerns         Walk 10 feet on uneven surface  activity   Assist Walk 10 feet on uneven surfaces activity did not occur: Safety/medical concerns         Wheelchair     Assist Is the patient using a wheelchair?: Yes Type of Wheelchair: Manual Wheelchair activity did not occur: Safety/medical concerns         Wheelchair 50 feet with 2 turns activity    Assist    Wheelchair 50 feet with 2 turns activity did not occur: Safety/medical concerns       Wheelchair 150 feet activity     Assist  Wheelchair 150 feet activity did not occur: Safety/medical concerns       Blood pressure 100/73, pulse 84, temperature 98.6 F (37 C), temperature source Oral, resp. rate 17, height 5\' 4"  (1.626 m), weight 77.1 kg, SpO2 100%, unknown if currently breastfeeding.   Medical Problem List and Plan: 1. 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  Antithrombotics: -DVT/anticoagulation:  Pharmaceutical: Eliquis             -antiplatelet therapy: N/A 3. Pain Management: Neurontin 100 mg 3 times daily, Topamax 25 mg twice daily, oxycodone 5 mg every 6 hours as needed 4. Mood/Behavior/Sleep: Klonopin 0.5 mg nightly, melatonin 5 mg nightly as needed             -antipsychotic agents: suggest Depakote or increase in gabapentin to help calm pt down;  5. Neuropsych/cognition: This patient is not capable of making decisions on her own behalf. 6. Skin/Wound Care: routine skin care for PEG and overall skin 7. Fluids/Electrolytes/Nutrition: will assess labs and recheck labs in AM 8.  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  Followed by oncology/hematology  9.  Anemia/thrombocytopenia/iron deficiency.  Follow-up CBC.  Continue Niferex  -1/24 hemoglobin a little lower again at 8.9, platelets overall stable at 79.  Not sure if some of hemoglobin decrease is delusional. Check occult blood  10.  Dysphagia.  Status post gastrostomy tube 03/21/2023 per interventional radiology.  Diet has been advanced to regular.  Dietary follow-up- still on TF's because not eating well.  11.  E. coli UTI.  Antibiotic therapy completed 12.  Tobacco/alcohol use.  Counseling 13. Severe RUE spasticity- would strongly suggest Botox of RUE soon as well as resting hand splint for R wrist and R PRAFO- will order.  14. Abd pain- from PEG vs constipation- if no BM, might need KUB.   -IR consult to assess Gtube site.  Pus noted at skin site. Appears CT scan is ordered  -Likely G-tube infection.  She is going for a IR procedure today.  Hospitalist team following-appreciate assistance.  She has been started on vancomycin and ceftriaxone for abdominal infection.  Echocardiogram and cultures have been ordered.  Spoke with hospitalist today, ID has been consulted.  -1/24 ID recommending vancomycin for 7 days total and can use linezolid if discharged prior to this  15.  Elevated transaminases-mild  -1/24 continued mild elevation, continue to monitor 16.  Hyponatremia  -Currently on IVF, hospitalist consulted  -Will DC free water per tube   -1/24 improved to 140 17.  Ileus  -Hospitalist consulted, continue IVF, discontinued tube feeds  -1/23 improving.  She had bowel movements today and yesterday  -1/24 patient perseverating on having diet.  Will start with trickle feeds today. Fleet enema ordered 18. Hypokalemia   -30 mEq twice daily for 2 doses      LOS: 3 days A FACE TO FACE EVALUATION WAS PERFORMED  Fanny Dance 03/28/2023, 3:38 PM

## 2023-03-28 NOTE — Progress Notes (Signed)
Nutrition Follow-up  DOCUMENTATION CODES:   Not applicable  INTERVENTION:   Continuous trickle TF via PEG due to Ileus:  Osmolite 1.5 at 20 ml/h once appropriate and if patient tolerates can advance to goal of 55 ml/hr (1320 ml per day) Prosource TF20 60 ml BID 200 ml FWF Q4H,  *Once past trickles goal of 100 ml FWF q4h   Provides 2240 kcal, 122 gm protein, 1003 ml free water daily (1603 ml daily with FWF)  Once medically feasible and patient is tolerating TF at goal can transition back to Bolus TF of: Osmolite 1.5 - 355 ml  QID 60 ml ProSourceTF20 daily  75 ml free water before and after each bolus feeding   MVI with minerals daily via tube  NUTRITION DIAGNOSIS:   Inadequate oral intake related to lethargy/confusion as evidenced by meal completion < 50%.  - Still applicable  GOAL:   Patient will meet greater than or equal to 90% of their needs  - Progressing   MONITOR:   PO intake, TF tolerance  REASON FOR ASSESSMENT:   Consult Enteral/tube feeding initiation and management  ASSESSMENT:   Pt with PMH of Kells isoimmunization pregnancy, unprovoked PE with positive lupus anticoagulant, hemoglobin C trait, anemia iron deficient thrombocytopenia, tobacco use, has two children ages 14 and 42. 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  12/20 - admitted with expressive aphasia and right sided weakness (previous night drank significant amount of ETOH and took Suboxone) dx with L MCA and watershed territory infarcts with failed attempt at mechanical thrombectomy.  12/26 - MRI showed increased infarcts.  1/17 - PEG placed 1/21 - admitted to rehab 1/22 - Bolus TF stopped due to misplaced PEG 1/23- PEG tube replaced, trickle feeds due to ileus   Bolus TF were stopped 1/22. Patient complained of abdominal pain upon further investigation, PEG tube was misplaced and was changed yesterday. Per MD CT scan shows evidence of possible ileus with gas in the bowel. Okay to start  trickles. Patient to remain NPO. Was drinking Mighty shakes when on regular diet.   Admit weight: 72.6 kg Current weight: 77.1 kg    Average Meal Intake: No meals documented  Nutritionally Relevant Medications: Scheduled Meds:  [START ON 03/29/2023] vitamin B-12  2,000 mcg Oral Daily   feeding supplement (OSMOLITE 1.5 CAL)  480 mL Per Tube Q24H   feeding supplement (PROSource TF20)  60 mL Per Tube BID   [START ON 03/29/2023] folic acid  1 mg Oral Daily   free water  200 mL Per Tube Q4H   gabapentin  100 mg Oral TID   [START ON 03/29/2023] iron polysaccharides  150 mg Oral Daily   [START ON 03/29/2023] multivitamin with minerals  1 tablet Per Tube Daily   [START ON 03/29/2023] polyethylene glycol  17 g Oral Daily   sodium phosphate  1 enema Rectal Once   topiramate  25 mg Oral BID   Continuous Infusions:  cefTRIAXone (ROCEPHIN)  IV 2 g (03/27/23 1707)   metronidazole 500 mg (03/28/23 0641)   vancomycin 1,000 mg (03/28/23 0836)   Labs Reviewed: Potassium 3.3, Calcium 8.8, B12 122   Diet Order:   Diet Order             Diet NPO time specified  Diet effective now                   EDUCATION NEEDS:   No education needs have been identified at this time  Skin:  Skin Assessment: Reviewed RN Assessment  Last BM:  1/23 type 7  Height:   Ht Readings from Last 1 Encounters:  03/25/23 5\' 4"  (1.626 m)    Weight:   Wt Readings from Last 1 Encounters:  03/25/23 77.1 kg    Ideal Body Weight:  54.5 kg  BMI:  Body mass index is 29.18 kg/m.  Estimated Nutritional Needs:   Kcal:  2000-2200  Protein:  100-115 grams  Fluid:  >2L/day   Elliot Dally, RD Registered Dietitian  See Amion for more information

## 2023-03-28 NOTE — Progress Notes (Signed)
[facility] Consultation Progress Note  Patient: Jacqueline [name] ZOX:[id number]   PCP: [name], MD DOB: [birth year]   DOA: 03/25/2023   DOS: 03/28/2023   Date of Service: the patient was seen and examined on 03/28/2023 Primary service: Horton Chin, MD   Brief Hospital Course: Patient with PMH of lupus anticoagulant causing PE, iron deficiency anemia presented to the hospital with complaints of expressive aphasia and was found to have left MCA territory infarct. Patient was taken for thrombectomy due to left M2 occlusion. Thrombectomy was successful, but then had worsening stenosis and subsequent complete closure of the left M2. Repeat MRI noted extension of the stroke.  Patient was transition back to Eliquis and had PEG tube placed. She was placed on tube feeds, but also allowed to eat orally. [facility] was consulted on 1/22 for medical management of her infection symptoms. Found to have staph epi bacteremia.  ID was consulted as well. Workup found that PEG tube was malpositioned which was replaced on 1/23.  Assessment and plan. Sepsis due to staph epi bacteremia. MRSE Patient had fever, leukocytosis meeting SIRS criteria. Blood cultures positive for staph epi.  MRSE. Abdominal pain reported by the patient with some irritation around the PEG tube site. PEG tube was exchanged. ID was consulted. Repeat cultures ordered although further workup is not recommended for now. Currently on IV vancomycin and ceftriaxone and Flagyl.  ID recommending utilizing only vancomycin for 7 days.  Will discontinue other antibiotics. ID currently signed off.  PEG tube malposition. Patient reported abdominal pain.  With fever and leukocytosis further workup was performed including a CT scan which showed evidence of retraction into the anterior abdominal wall. IR was reconsulted and the patient was taken for replacement of new 20 French gastrostomy tube. Tube is ready for immediate use. If  the patient is able to tolerate oral diet and has adequate oral intake, patient will likely not require PEG tube going forward. Per chart review PEG tube was only placed as the patient had not met calorie requirement based on the calorie count.  Ileus. Constipation. CT scan also shows evidence of possible ileus with gas in the bowel. Also significant constipation seen as well. Increase bowel regimen.  Continue as needed enema. Okay to initiate trickle fluid. Okay for any ice chips and sips of water. If no vomiting okay to advance to clear liquid diet tomorrow.  (Per SLP patient can be on regular thin liquids.)  Hyponatremia. Monitor for now.  Elevated LFT. Minimally elevated. For now we will monitor.  Thrombocytopenia, intermittently present. Platelet count continues to trend down.  Now stable. Etiology not clear.  Possibly in the setting of infection.  Monitor for now. If the platelet count drops below 60,000 though continuation of Eliquis would be tricky and would recommend transition to heparin.  History of lupus anticoagulant. History of PE. Acute CVA. On anticoagulation with Eliquis. As above depending on her platelet count would recommend transition to heparin if the counts are less than 60,000.  Iron deficiency anemia. H&H currently relatively stable So far no evidence of bleeding. Monitor.  We will continue to follow the patient.  Subjective: Patient was sleeping.  When I walked in she called her father.  When I ask her about pain she denied anything but then requested me to give her the heating pad so that she can put it on her abdomen where the PEG tube is inserted. No other events seen identified overnight. No nausea no vomiting.  No BM  so far.   Objective: Vitals:   03/27/23 2326 03/28/23 0421 03/28/23 1400 03/28/23 1952  BP: 119/75 105/66 100/73 98/61  Pulse: (!) 104 (!) 105 84 87  Resp: 18 16 17 17   Temp: 99 F (37.2 C) 98.3 F (36.8 C) 98.6 F (37 C)  98.8 F (37.1 C)  TempSrc: Oral Oral Oral   SpO2: 99% 100% 100% 100%  Weight:      Height:        In minimal distress. No rash. S1-S2 present. Clear to auscultation. Was not present.  Mild tenderness around the PEG tube insertion site. Aphasia noted.   Family Communication: Family was on the phone at the time of my interview.  Data Reviewed: Reviewed CBC and CMP. Reordered CBC and CMP.  Author: Lynden Oxford, MD  [facility] 03/28/2023  8:02 PM  To reach On-call, Look up on care teams to locate the [facility] team or provider name and reach out to them via secure chat or [email or web address] Between 7PM-7AM, please contact night-coverage. If you still have difficulty reaching the attending provider, please page the [facility] (Director on Call) for [facility] on amion for assistance.

## 2023-03-28 NOTE — IPOC Note (Signed)
Overall Plan of Care [facility]) Patient Details Name: Jacqueline [name] MRN: [id number] DOB: January 25, 2001  Admitting Diagnosis: Left middle cerebral artery stroke [facility])  Hospital Problems: Principal Problem:   Left middle cerebral artery stroke [facility]) Active Problems:   Thrombocytopenia (HCC)   Iron deficiency anemia   Lupus anticoagulant disorder (HCC)   SIRS (systemic inflammatory response syndrome) (HCC)   Hyponatremia   Ileus (HCC)   Pain around PEG tube site, initial encounter   Transaminitis   History of pulmonary embolism   Bacteremia     Functional Problem List: Nursing Bladder, Bowel, Endurance, Nutrition, Pain, Safety, Medication Management  PT Balance, Endurance, Motor, Nutrition, Pain, Perception, Safety, Sensory, Skin Integrity, Behavior, Edema  OT Balance, Behavior, Cognition, Edema, Endurance, Motor, Nutrition, Pain, Safety, Sensory, Vision, Skin Integrity  SLP Linguistic, Cognition  TR         Basic ADL's: OT Eating, Grooming, Bathing, Dressing, Toileting     Advanced  ADL's: OT       Transfers: PT Bed Mobility, Bed to Chair, Car, Occupational psychologist, Research scientist (life sciences): PT Ambulation, Psychologist, prison and probation services, Stairs     Additional Impairments: OT Fuctional Use of Upper Extremity  SLP Swallowing, Communication, Social Cognition expression, comprehension Social Interaction, Problem Solving, Memory, Attention, Awareness  TR      Anticipated Outcomes Item Anticipated Outcome  Self Feeding Supervision  Swallowing  mod I   Basic self-care  Min A  Toileting  Min A   Bathroom Transfers Min A  Bowel/Bladder  Manage bowel with medications/ bladder with toileting  Transfers  CGA with LRAD  Locomotion  CGA with LRAD  Communication  Mod to Max A  Cognition     Pain  <4w/ prns  Safety/Judgment  manage safety independently with cues   Therapy Plan: PT Intensity: Minimum of 1-2 x/day ,45 to 90 minutes PT Frequency: 5 out of 7 days PT  Duration Estimated Length of Stay: 3-4 weeks OT Intensity: Minimum of 1-2 x/day, 45 to 90 minutes OT Frequency: 5 out of 7 days OT Duration/Estimated Length of Stay: 3-4 weeks SLP Intensity: Minumum of 1-2 x/day, 30 to 90 minutes SLP Frequency: 3 to 5 out of 7 days SLP Duration/Estimated Length of Stay: 3-4 weeks   Team Interventions: Nursing Interventions Patient/Family Education, Medication Management, Bladder Management, Disease Management/Prevention, Pain Management, Discharge Planning, Bowel Management  PT interventions Ambulation/gait training, Discharge planning, Functional mobility training, Psychosocial support, Therapeutic Activities, Balance/vestibular training, Disease management/prevention, Neuromuscular re-education, Skin care/wound management, Therapeutic Exercise, Wheelchair propulsion/positioning, Cognitive remediation/compensation, DME/adaptive equipment instruction, Pain management, Splinting/orthotics, UE/LE Strength taining/ROM, Community reintegration, Development worker, international aid stimulation, Equities trader education, Museum/gallery curator, UE/LE Coordination activities  OT Interventions Warden/ranger, Discharge planning, Functional electrical stimulation, Pain management, Self Care/advanced ADL retraining, Therapeutic Activities, UE/LE Coordination activities, Cognitive remediation/compensation, Disease mangement/prevention, Functional mobility training, Patient/family education, Therapeutic Exercise, Skin care/wound managment, Visual/perceptual remediation/compensation, DME/adaptive equipment instruction, Neuromuscular re-education, Psychosocial support, Splinting/orthotics, UE/LE Strength taining/ROM, Wheelchair propulsion/positioning  SLP Interventions Dysphagia/aspiration precaution training, Internal/external aids, Cognitive remediation/compensation, Speech/Language facilitation, Cueing hierarchy, Environmental controls, Therapeutic Activities, Multimodal communication  approach, Patient/family education, Functional tasks  TR Interventions    SW/CM Interventions Discharge Planning, Psychosocial Support, Patient/Family Education   Barriers to Discharge MD  Medical stability, IV antibiotics, Behavior, and Nutritional means  Nursing Home environment access/layout One level  Discharge Home Access: Stairs to enter  Entrance Stairs-Rails: Right  Entrance Stairs-Number of Steps: 10 or more steps to 2nd level apartment  PT Inaccessible home environment, Home environment  access/layout, Incontinence, Wound Care, Other (comments) R hemi, expressive aphasia, flight of steps to enter apartment, PEG tube  OT Inaccessible home environment, Home environment access/layout, Decreased caregiver support, Incontinence, Behavior    SLP Incontinence, Nutrition means    SW Insurance for SNF coverage     Team Discharge Planning: Destination: PT-Home ,OT- Home , SLP-Home Projected Follow-up: PT-Outpatient PT, OT-  Outpatient OT, SLP-Home Health SLP, Outpatient SLP, 24 hour supervision/assistance Projected Equipment Needs: PT-To be determined, OT- To be determined, SLP-None recommended by SLP Equipment Details: PT- , OT-  Patient/family involved in discharge planning: PT- Patient,  OT-Patient, Family member/caregiver, SLP-Patient  MD ELOS: 3-4 weeks Medical Rehab Prognosis:  Good Assessment: The patient has been admitted for CIR therapies with the diagnosis of L MCA CVA. The team will be addressing functional mobility, strength, stamina, balance, safety, adaptive techniques and equipment, self-care, bowel and bladder mgt, patient and caregiver education. Goals have been set at min A, mod A with communication. Anticipated discharge destination is home.        See Team Conference Notes for weekly updates to the plan of care

## 2023-03-28 NOTE — Progress Notes (Signed)
Physical Therapy Session Note  Patient Details  Name: Jacqueline [name] MRN: [id number] Date of Birth: 2000-12-31  Today's Date: 03/28/2023 PT Individual Time: 1115-1205 PT Individual Time Calculation (min): 50 min   Short Term Goals: Week 1:  PT Short Term Goal 1 (Week 1): pt will transfer supine<>sitting EOB with mod A of 1 PT Short Term Goal 2 (Week 1): pt will transfer sit<>stand with LRAD and mod A of 1 PT Short Term Goal 3 (Week 1): pt will ambulate 29ft with LRAD and max A of 1  Skilled Therapeutic Interventions/Progress Updates:    Session focused on functional bed mobility retraining, transfers, and gait with focus on NMR for postural control retraining, RLE activation, and overall balance. Pt performed bed mobility with mod assist for RLE management and trunk activation and extra time. Difficulty with communication throughout due to aphasia but able to follow simple commands easily. +2 for transfers with factilitation for anterior weightshift and weightshifting for movement of RLE. Gait trials x 2 on rail in hallway with LUE support x 30' each trial with focus on RLE activation in stance, trunk control, and R ankle control. Pt R ankle rolls when R knee goes into hyperextension - if keeps knee "soft", able to control. Requires total assist for advancement, no activate hip flexion initiation noted. End of session encouraged to stay up in w/c but pt strong declined. +2 stand step tranfser back to bed as described above and all needs in reach. RUE positioned on pillow and heating pad applied for relief.   Therapy Documentation Precautions:  Precautions Precautions: Fall Precaution Comments: PEG, expressive aphasia with agitation, Rt hemi Restrictions Weight Bearing Restrictions Per Provider Order: No  Pain: Reporting pain in RUE and belly intermittently - unable to rate.    Therapy/Group: Individual Therapy  Karolee Stamps Darrol Poke, PT, DPT, CBIS  03/28/2023, 12:18  PM

## 2023-03-28 NOTE — Progress Notes (Signed)
Patient ID: Jacqueline [name], female   DOB: [birth year], 23 y.o.   MRN: [id number] Met with the patient to review current medical situation, PEG securement device, toileting, and plan of care. Discussed pain management; continues to guard abd. But does attend to right arm, adjusting resting hand splint and assisting with repositioning in bed.  Expressed frustration with NPO status; wants to eat. Reinforced need for testing to confirm status of ileus and ability to resume a diet by mouth. Continue to follow along to address educational needs to facilitate preparation for discharge. Pamelia Hoit

## 2023-03-28 NOTE — Plan of Care (Signed)
   03/28/23 0700  Behavioral Plan Guideline  Behavior to decrease/eliminate Improve pain Decrease impulsivity; Decrease pulling at lines/tubes; Decrease restlessness; Decrease incontinence; Decrease impulsivity with toileting  Decrease pulling at lines/tubes  PEG (Pulling at PEG dressing)  Changes to environment Lights on, blinds open during the day, off and closed at night; Minimize in room stimulation;Door closed to decrease hallway noise; Within arms reach during toileting; Soft touch call bell; Play preferred music  Minimize in room stimulation  TV off; Limit visitors to no more than 2 at a time  Interventions Bed alarm setting; # of rails up; Timed toileting; Limit nighttime interruptions  Bed alarm setting Medium sensitivity  # of rails up 3  Recommendations for interactions with patient Remain calm and reduce environment stimulation with agitation; Provide communication board for every interaction; Redirect behaviors to alternate tasks/topics; DO NOT argue with patient; Offer toileting with every interaction; Offer fluids every interaction;Offer patient preferred music; Minimize number of visitors at a time  In Attendance at Behavior Plan Meeting  OT,  SLP,  RN

## 2023-03-28 NOTE — Progress Notes (Signed)
Occupational Therapy Session Note  Patient Details  Name: Jacqueline [name] MRN: [id number] Date of Birth: November 07, 2000  Today's Date: 03/28/2023 OT Individual Time: 0905-1000 & 1450-1530 OT Individual Time Calculation (min): 55 min & 40 min   Short Term Goals: Week 1:  OT Short Term Goal 1 (Week 1): Pt will sit EOB for ADLs for > 3 mins with mod A for sitting balance OT Short Term Goal 2 (Week 1): Pt will complete functional toilet transfer with max A using LRAD OT Short Term Goal 3 (Week 1): Pt with attend to RUE during ADL task with min verbal cues during functional task OT Short Term Goal 4 (Week 1): Pt will donn LB clothing with max A using AE PRN  Skilled Therapeutic Interventions/Progress Updates:  Session 1 Skilled OT intervention completed with focus on ADL retraining, functional transfers and sit > stands. Pt received upright in bed. Pt remained aphasic during session (expressive > receptive), but with improved ability to respond to yes/no questions this date about 75% of the time. Increased time needed for follow through but pt also with improved ability to follow 1 step commands. Pt agreeable to session. RUE pain reported; pre-medicated. OT offered rest breaks, repositioning and distraction  throughout for pain reduction.  Pt assisted OT in doffing RUE resting hand splint in prep for ADLs. Pt transitioned to EOB with min/mod A for RLE and trunk support however pt very effortful. Able to sit statically with close supervision, but dynamically with CGA/min A. Mod A +2 with HHA for sit > stand and for stand pivot > w/c with Rt knee block and manual facilitation for stepping with RLE.   Seated at sink, pt washed UB with mod A, LB with max A, with mod A needed for sit > stand at sink with LUE on sink for stability and Rt knee block while 2nd person assisted with pericare. Upon removal of brief, pt noted to be heavily incontinent of urinary void. New brief donned in stance with +2 needed. Pt  actively picking at PEG wound dressing with redirection needed. Nurse notified for PEG dressing change and IV management for UB dressing. With max cues, pt able to assist OT in donning gown over RUE first, then overall mod A for UB dressing.   Nursing requesting that pt be returned to bed for x-ray therefore utilized stedy for time. Needed multimodal cueing, but able to stand with mod A from w/c in stedy, sit in perched position with close supervision during dependent transfer > EOB, stood with min A from perched position. Mod A needed for sit > supine for Rt hemi body. Donned RUE resting hand splint for pain management. Pt remained semi upright in bed, with bed alarm on/activated, and with all needs in reach at end of session.  Session 2 Skilled OT intervention completed with focus on functional transfers, dynamic balance, R NMR. Pt received upright in bed. Remained expressively aphasic, but with appropriate responses to yes/no questions about 50% of time. Pt agreeable to session. PEG placement pain reported with positional changes; nurse notified of pain med request. OT offered rest breaks, repositioning and distraction throughout for pain reduction.  Transitioned to EOB with heavy mod A for RLE and trunk elevation. Min A sit > stand in stedy with cues for Rt hemi body positioning, then able to sit with supervision in perched position during dependent transfer to w/c, and CGA to stand from perched position > w/c.   Transported dependently in w/c <> gym.  Pt participated in the following dynamic standing balance and endurance tasks to promote independence and safety during BADLs and functional mobility: -retrieving squigz from long mirror. Mod A sit > stand with Rt knee guard and HHA. Mod A needed for balance on Rt side while 2nd person guarded on Lt side but did not assist. Cues provided for Rt knee extension in prep for facilitating weight shift onto RLE however activation noted only 50% of time and  anticipate pt with difficulty concentrating on multiple tasks at once but no true buckling noted. Mod A needed to sequence sitting back in w/c  Back in room, pt utilized bed rail on Lt side of bed with LUE to assist with sit > stand at min/mod A level, then with Rt knee blocking, pt completed mod A stand pivot > EOB with 2nd person present for managing IV lines. Max A for sit > supine.   Nurse present for meds, and pt became increasingly upset/behavioral when told she would have to continue tube feeds until a BM due to the current ileus. Emotional support offered. Pt remained semi upright in bed, with bed alarm on/activated, and with all needs in reach at end of session.   Therapy Documentation Precautions:  Precautions Precautions: Fall Precaution Comments: PEG, expressive aphasia with agitation, Rt hemi Restrictions Weight Bearing Restrictions Per Provider Order: No    Therapy/Group: Individual Therapy  Melvyn Novas, MS, OTR/L  03/28/2023, 3:41 PM

## 2023-03-28 NOTE — Progress Notes (Signed)
Speech Language Pathology Daily Session Note  Patient Details  Name: Jacqueline [name] MRN: [id number] Date of Birth: 12/20/2000  Today's Date: 03/28/2023 SLP Individual Time: 8657-8469 SLP Individual Time Calculation (min): 42 min  Short Term Goals: Week 1: SLP Short Term Goal 1 (Week 1): Patient will complete automatic speech task with 70% acc given max multimodal cues SLP Short Term Goal 2 (Week 1): Patient will follow one step directions with 50% acc given mod multimodal cues SLP Short Term Goal 3 (Week 1): Pt will verbalize basic biographical/ personal information with 50% acc and max multimodal  A SLP Short Term Goal 4 (Week 1): Patient will answer simple to complex yes/ no questions with 70% acc given mod multimodal cues SLP Short Term Goal 5 (Week 1): Patient will consume regular solids with adequate bolus formation and no s/sx asp in >90% opportunities with sup A  Skilled Therapeutic Interventions: SLP conducted skilled therapy session targeting dysphagia management and communication goals. Patient received asleep but eventually rousing to SLP and other staff encouragement. Once awake, patient participatory during functional language tasks. Patient followed 1-step directions during pericare and brief change with NT and SLP with supervision to min assist. Patient utilized novel verbalizations and communication board to indicate wants and needs including pain level/location. Patient verbalized "I'm hungry" and is exhibiting increased functional vocabulary compared to evaluation, demonstrating quick progress. Patient required overall mod assist to communicate basic environmental needs verbally and demonstrated some minimal frustration. SLP provided encouragement. During session, Patient tolerated pills whole with thin liquids with no overt s/sx of penetration/aspiration. MD entered to discuss plan for diet advancement after imaging to assess progression of abdominal blockage, with patient  gesturing to indicate understanding. Patient desires to eat by mouth. When cleared medically by MD, recommend regular/thin liquid diet from a swallowing safety standpoint. Patient was left in lowered bed with call bell in reach and bed alarm set. SLP will continue to target goals per plan of care.      Pain Pain Assessment Pain Scale: 0-10 Pain Score: 10-Worst pain ever Faces Pain Scale: Hurts even more Pain Location:  (radiating) Pain Intervention(s): Medication (See eMAR);RN made aware  Therapy/Group: Individual Therapy  Jeannie Done, M.A., CCC-SLP  Yetta Barre 03/28/2023, 8:53 AM

## 2023-03-29 ENCOUNTER — Inpatient Hospital Stay (HOSPITAL_COMMUNITY): Payer: Medicaid Other

## 2023-03-29 LAB — CULTURE, BLOOD (ROUTINE X 2)
Special Requests: ADEQUATE
Special Requests: ADEQUATE

## 2023-03-29 LAB — BASIC METABOLIC PANEL
Anion gap: 11 (ref 5–15)
BUN: 12 mg/dL (ref 6–20)
CO2: 21 mmol/L — ABNORMAL LOW (ref 22–32)
Calcium: 8.7 mg/dL — ABNORMAL LOW (ref 8.9–10.3)
Chloride: 106 mmol/L (ref 98–111)
Creatinine, Ser: 0.81 mg/dL (ref 0.44–1.00)
GFR, Estimated: >60 mL/min (ref >60)
Glucose, Bld: 110 mg/dL — ABNORMAL HIGH (ref 70–99)
Potassium: 3.4 mmol/L — ABNORMAL LOW (ref 3.5–5.1)
Sodium: 138 mmol/L (ref 135–145)

## 2023-03-29 LAB — VANCOMYCIN, PEAK: Vancomycin Pk: 34 ug/mL (ref 30–40)

## 2023-03-29 LAB — VANCOMYCIN, TROUGH: Vancomycin Tr: 15 ug/mL (ref 15–20)

## 2023-03-29 MED ORDER — VANCOMYCIN HCL 1000 MG IV SOLR
750.0000 mg | Freq: Three times a day (TID) | INTRAVENOUS | Status: DC
  Administered 2023-03-29: 750 mg via INTRAVENOUS
  Filled 2023-03-29 (×3): qty 15

## 2023-03-29 MED ORDER — VANCOMYCIN HCL 750 MG/150ML IV SOLN
750.0000 mg | Freq: Three times a day (TID) | INTRAVENOUS | Status: DC
  Filled 2023-03-29 (×2): qty 150

## 2023-03-29 MED ORDER — DOCUSATE SODIUM 50 MG/5ML PO LIQD
200.0000 mg | Freq: Two times a day (BID) | ORAL | Status: DC
  Administered 2023-03-29 – 2023-03-30 (×3): 200 mg
  Filled 2023-03-29 (×6): qty 20

## 2023-03-29 MED ORDER — POTASSIUM CHLORIDE 20 MEQ PO PACK
60.0000 meq | PACK | Freq: Once | ORAL | Status: AC
  Administered 2023-03-29: 60 meq
  Filled 2023-03-29: qty 3

## 2023-03-29 NOTE — Progress Notes (Signed)
PHARMACY ANTIBIOTIC CONSULT NOTE   Jacqueline [name] a 23 y.o. female with blood cultures positive for MRSE- transient infection vs. contaminant .  Pharmacy has been consulted for Vancomycin dosing. Planned 7d DOT per ID.   Scr 0.81 (03/29/2023), WBC 5.0 (03/28/2023)  1/25: Vancomycin 1,000 mg dose administered 07:41 and infused over 1 hour  VP 34 10:27  VT 15 15:24   Based on levels, vancomycin dose is supratherapeutic with AUC 658. Scr has remained stable at ~0.8.   Estimated Creatinine Clearance: 107.7 mL/min (by C-G formula based on SCr of 0.81 mg/dL).  Plan: Decrease Vancomycin dose to 750 mg IV Q8H (eAUC 492)- stop date continued form previous order   Monitor renal function   Allergies:  No Known Allergies  Filed Weights   03/25/23 1400  Weight: 77.1 kg (169 lb 15.6 oz)       Latest Ref Rng & Units 03/28/2023    1:11 PM 03/27/2023    2:04 PM 03/26/2023    2:10 AM  CBC  WBC 4.0 - 10.5 K/uL 5.0  9.9  12.8   Hemoglobin 12.0 - 15.0 g/dL 8.9  9.7  78.2   Hematocrit 36.0 - 46.0 % 25.8  27.5  31.6   Platelets 150 - 400 K/uL 79  72  88     Antibiotics Given (last 72 hours)     Date/Time Action Medication Dose Rate   03/26/23 1732 New Bag/Given   cefTRIAXone (ROCEPHIN) 2 g in sodium chloride 0.9 % 100 mL IVPB 2 g 200 mL/hr   03/26/23 1820 New Bag/Given   metroNIDAZOLE (FLAGYL) IVPB 500 mg 500 mg 100 mL/hr   03/27/23 9562 New Bag/Given   metroNIDAZOLE (FLAGYL) IVPB 500 mg 500 mg 100 mL/hr   03/27/23 0819 New Bag/Given   vancomycin (VANCOCIN) IVPB 1000 mg/200 mL premix 1,000 mg 200 mL/hr   03/27/23 1605 New Bag/Given   vancomycin (VANCOCIN) IVPB 1000 mg/200 mL premix 1,000 mg 200 mL/hr   03/27/23 1707 New Bag/Given   cefTRIAXone (ROCEPHIN) 2 g in sodium chloride 0.9 % 100 mL IVPB 2 g 200 mL/hr   03/27/23 1741 New Bag/Given   metroNIDAZOLE (FLAGYL) IVPB 500 mg 500 mg 100 mL/hr   03/27/23 2338 New Bag/Given   vancomycin (VANCOCIN) IVPB 1000 mg/200 mL premix 1,000 mg 200  mL/hr   03/28/23 0641 New Bag/Given   metroNIDAZOLE (FLAGYL) IVPB 500 mg 500 mg 100 mL/hr   03/28/23 0836 New Bag/Given   vancomycin (VANCOCIN) IVPB 1000 mg/200 mL premix 1,000 mg 200 mL/hr   03/28/23 1530 New Bag/Given   vancomycin (VANCOCIN) IVPB 1000 mg/200 mL premix 1,000 mg 200 mL/hr   03/29/23 0005 New Bag/Given   vancomycin (VANCOCIN) IVPB 1000 mg/200 mL premix 1,000 mg 200 mL/hr   03/29/23 0741 New Bag/Given   vancomycin (VANCOCIN) IVPB 1000 mg/200 mL premix 1,000 mg 200 mL/hr   03/29/23 1636 New Bag/Given   vancomycin (VANCOCIN) IVPB 1000 mg/200 mL premix 1,000 mg 200 mL/hr       CTX x1 12/23 for dirty UA, 12/25 >> 12/28 CTX 2gm 1/22>>1/23 Flagyl IV 1/22 >>1/24 Vanc 1/23 >>   12/23 blood: negative 12/24 MRSA PCR: negative 12/24 Urine: 50K Ecoli (R amp/unasyn) 12/24 T pallidium - nonreactive 1/22 blood: GPC in clusters - MRSE on BCID > Staph epi - pending 1/23 BCID: MRSE in 2 diff BC--added Vancomycin 1/24 blood: ordered  Thank you for allowing pharmacy to be a part of this patient's care.  Jani Gravel, PharmD Clinical Pharmacist  03/29/2023 4:52 PM

## 2023-03-29 NOTE — Progress Notes (Signed)
Speech Language Pathology Daily Session Note  Patient Details  Name: Jacqueline [name] MRN: [id number] Date of Birth: 06/24/2000  Today's Date: 03/29/2023 SLP Individual Time: 1501-1531 SLP Individual Time Calculation (min): 30 min  Short Term Goals: Week 1: SLP Short Term Goal 1 (Week 1): Patient will complete automatic speech task with 70% acc given max multimodal cues SLP Short Term Goal 2 (Week 1): Patient will follow one step directions with 50% acc given mod multimodal cues SLP Short Term Goal 3 (Week 1): Pt will verbalize basic biographical/ personal information with 50% acc and max multimodal  A SLP Short Term Goal 4 (Week 1): Patient will answer simple to complex yes/ no questions with 70% acc given mod multimodal cues SLP Short Term Goal 5 (Week 1): Patient will consume regular solids with adequate bolus formation and no s/sx asp in >90% opportunities with sup A  Skilled Therapeutic Interventions:  1431: Upon entrance, patient asleep. SLP attempted to waken via verbal and tactile stim with patient briefly opening eyes ~1-2 seconds before closing. SLP attempted to encourage patient to participate in ST this date and sit upright for PO (patient recently placed on diet this AM). Patient verbalized 'ok.' however quickly fell back asleep.   1501: SLP re-entered room and attempted to waken patient via verbal/tactile stim. Patient more willing to wake up and participate in session. Patient gestured need for pericare. RN/SLP completed pericare at the bedside. Patient followed single step directions to roll to each side. RN administered medications whole in water and patient with consecutive sips and no s/sx of aspiration. SLP targeted language through yes/no questions. Patient with ~60% accuracy this date when answering simple yes/no questions about children and their ages. During session, phlebotomy entered for blood draw and asked for patients birthdaty. Patient stated "[birth year]." SLP oriented  patient to correct date of birth, though she refused to verbalize. Patient with consistent social language throughout session, though when asked structured questions presented with neologisms and phonemic paraphasias. SLP introduced new communication board and encouraged patient to point to objects such as "hungry" "thirsty" and "bathroom." Pt pointed to correct objects with 75% accuracy given minA. Patient left in bed with alarm set and call bell in reach. Continue POC.   30 minutes of session were missed due to initial fatigue. SLP will attempt to make up missed minutes as therapist/patient schedule allows.    Pain None verbalized/visualized   Therapy/Group: Individual Therapy  Mutasim Tuckey M.A., CF-SLP 03/29/2023, 7:45 AM

## 2023-03-29 NOTE — Plan of Care (Signed)
  Problem: RH BOWEL ELIMINATION Goal: RH STG MANAGE BOWEL WITH ASSISTANCE Description: STG Manage Bowel with medications with minimal Assistance. Outcome: Progressing   Problem: RH BLADDER ELIMINATION Goal: RH STG MANAGE BLADDER WITH ASSISTANCE Description: STG Manage Bladder with minimal Assistance Outcome: Progressing

## 2023-03-29 NOTE — Progress Notes (Signed)
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 Patient was taken for thrombectomy due to left M2 occlusion. Thrombectomy was successful, but then had worsening stenosis and subsequent complete closure of the left M2. Repeat MRI noted extension of the stroke.  Patient was transition back to Eliquis and had PEG tube placed. She was placed on tube feeds, but also allowed to eat orally. TRH was consulted on 1/22 for medical management of her infection symptoms. Found to have staph epi bacteremia.  ID was consulted as well. Workup found that PEG tube was malpositioned which was replaced on 1/23.  Ileus seems to be resolved.  Assessment and plan. Sepsis due to staph epi bacteremia. MRSE Patient had fever, leukocytosis meeting SIRS criteria. Blood cultures positive for staph epi.  MRSE. Abdominal pain reported by the patient with some irritation around the PEG tube site. PEG tube was exchanged. ID was consulted. Repeat cultures ordered so far negative.  Further workup is not recommended for now. Currently on IV vancomycin for a total of 7 days  Was on ceftriaxone and Flagyl, now stopped. ID currently signed off.  PEG tube malposition. Patient reported abdominal pain at the peg tube site. With fever and leukocytosis further workup was performed including a CT scan which showed evidence of retraction into the anterior abdominal wall. IR was reconsulted and the patient was taken for replacement of new 20 French gastrostomy tube. Tube is ready for immediate  use. If the patient is able to tolerate oral diet and has adequate oral intake, patient will likely not require PEG tube going forward. Per chart review PEG tube was only placed as the patient had not met calorie requirement based on the calorie count.   Ileus. Constipation. CT scan also shows evidence of possible ileus with dilated bowel. Also significant constipation seen as well. Repeat x ray on 1/25 shows movement of stool and no significant evidence of SBO.  Will advance the diet to full liquid diet for lunch and if tolerate to soft diet for dinner.  Pt is very frustrated with regards to her inability to eat food and I have tried to reassure her and her family who was on the phone at the time of my interview that I am hopeful that we will be able to advance her diet today.   For constipation  Continue miralax bid, increase colace to 200 bid. Continue simethicone.  Hypokalemia Replacement ordered.    Hyponatremia. Resolved    Elevated LFT. Minimally elevated. For now we will monitor.   Thrombocytopenia, intermittently present chronically. Folic acid deficiency Iron deficiency anemia Vitamin B12 deficiency  Platelet count chronically low, Now stable. Hematology was consulted during inpatient admission.  B12 122, continue supplement.  Folate 9.9 continue supplement.  Iron was low in December but improved in January   History of lupus anticoagulant. History of PE. Acute CVA. On anticoagulation with Eliquis. Monitor platelets daily.   We will continue to follow the patient.    Subjective: frustrated and fixated on her diet. No new complains, had a BM yesterday.   Physical Exam: General:  in Mild distress, No Rash Abdomen: Bowel Sound present, No tenderness Extremities: No edema Neuro: Alert and oriented x3, no new focal deficit, aphasia present,  Data Reviewed: I have Reviewed nursing notes, Vitals, and Lab results. Since last encounter, pertinent lab results bmp    .   Vitals:   03/28/23 0421 03/28/23 1400 03/28/23 1952 03/29/23 0600  BP: 105/66 100/73 98/61 96/67   Pulse: (!) 105 84 87 93  Resp: 16 17 17 17   Temp: 98.3 F (36.8 C) 98.6 F (37 C) 98.8 F (37.1 C) 98.5 F (36.9 C)  TempSrc: Oral Oral    SpO2: 100% 100% 100% 100%  Weight:      Height:       Family Communication: family on the phone at the time of my interview.    Author: Lynden Oxford, MD  [facility] 03/29/2023  11:07 AM  To reach On-call, Look up on care teams to locate the [facility] team or provider name and reach out to them via secure chat or [email or web address] Between 7PM-7AM, please contact night-coverage. If you still have difficulty reaching the attending provider, please page the [facility] (Director on Call) for [facility] on amion for assistance.

## 2023-03-29 NOTE — Progress Notes (Signed)
PROGRESS NOTE   Subjective/Complaints:  Pt doing ok, endorses she slept ok. Indicates some belly pain at the PEG site, but manageable (nods yes). Bothered by being wet, called for nurse twice. LBM yesterday. Shakes her head no when asked if she has any CP, SOB, n/v, but somewhat of an unreliable historian.  Wants to eat.   ROS: Limited by aphasia   Objective:   DG Abd 1 View Result Date: 03/28/2023 CLINICAL DATA:  Ileus EXAM: ABDOMEN - 1 VIEW portable COMPARISON:  03/28/23. FINDINGS: G tube overlies the stomach. Moderate stool in the distended right side of the colon. There is air in other loops of bowel diffusely. No obstruction or free air. Appearance is similar previous. IMPRESSION: No significant interval change. Electronically Signed   By: Karen Kays M.D.   On: 03/28/2023 14:41   Recent Labs    03/27/23 1404 03/28/23 1311  WBC 9.9 5.0  HGB 9.7* 8.9*  HCT 27.5* 25.8*  PLT 72* 79*   Recent Labs    03/28/23 1311 03/29/23 0640  NA 140 138  K 3.3* 3.4*  CL 108 106  CO2 23 21*  GLUCOSE 92 110*  BUN 8 12  CREATININE 0.79 0.81  CALCIUM 8.8* 8.7*    Intake/Output Summary (Last 24 hours) at 03/29/2023 1149 Last data filed at 03/29/2023 0301 Gross per 24 hour  Intake 785.67 ml  Output --  Net 785.67 ml        Physical Exam: Vital Signs Blood pressure 96/67, pulse 93, temperature 98.5 F (36.9 C), resp. rate 17, height 5\' 4"  (1.626 m), weight 77.1 kg, SpO2 100%, unknown if currently breastfeeding.   General: NAD, bothered by being wet, calls for nurse twice. Indicates hunger.  HEENT: Head is normocephalic, atraumatic, mucous membranes moist Neck: Supple without JVD or lymphadenopathy Heart: Reg rate and rhythm. No murmurs rubs or gallops Chest: CTA bilaterally without wheezes, rales, or rhonchi; no distress Abdomen: Positive bowel sounds, abdomen is soft, very mild abdominal tenderness around G-tube but c/d/i.   Urostomy tube in place. Extremities: No clubbing, cyanosis, or edema. Pulses are 2+ Psych: Appears frustrated at times Skin: Clean and intact without signs of breakdown over exposed surfaces, Gtube C/D/I Neuro: expressive aphasia MsK: RUE/RLE flaccid, moves LUE/LLE antigravity  PRIOR EXAMS: Musculoskeletal:     Cervical back: Normal range of motion and neck supple.     Comments: LUE/LLE moving well again gravity RUE- no voluntary movement seen of RUE or RLE   Neurological:     Comments: Patient came back from working with therapy.  Awake and alert.  She does make eye contact with examiner.  Increased verbal output today. MAS of 3-4 in R shoulder esp External rotation; MAS of 4 in R elbow- lacking 45 degrees of extension with a lot of work; MAS of 2-3 of R hand- in fist at rest, but could completely open fingers and get past wrist neutral.  Difficult to assess sensation      Assessment/Plan: 1. Functional deficits which require 3+ hours per day of interdisciplinary therapy in a comprehensive inpatient rehab setting. Physiatrist is providing close team supervision and 24 hour management of active medical problems listed  below. Physiatrist and rehab team continue to assess barriers to discharge/monitor patient progress toward functional and medical goals  Care Tool:  Bathing    Body parts bathed by patient: Right arm, Chest, Abdomen, Front perineal area   Body parts bathed by helper: Left arm, Buttocks, Right upper leg, Left upper leg, Right lower leg, Left lower leg, Face     Bathing assist Assist Level: Maximal Assistance - Patient 24 - 49%     Upper Body Dressing/Undressing Upper body dressing   What is the patient wearing?: Pull over shirt    Upper body assist Assist Level: Maximal Assistance - Patient 25 - 49%    Lower Body Dressing/Undressing Lower body dressing      What is the patient wearing?: Incontinence brief, Pants     Lower body assist Assist for lower  body dressing: Total Assistance - Patient < 25%     Toileting Toileting    Toileting assist Assist for toileting: 2 Helpers     Transfers Chair/bed transfer  Transfers assist  Chair/bed transfer activity did not occur: Safety/medical concerns (unsafe to get up)  Chair/bed transfer assist level: 2 Helpers     Locomotion Ambulation   Ambulation assist      Assist level: 2 helpers Assistive device: Other (comment) (3 muskateer assist + chair follow) Max distance: 30'   Walk 10 feet activity   Assist     Assist level: 2 helpers Assistive device: Other (comment) (Rail)   Walk 50 feet activity   Assist Walk 50 feet with 2 turns activity did not occur: Safety/medical concerns         Walk 150 feet activity   Assist Walk 150 feet activity did not occur: Safety/medical concerns         Walk 10 feet on uneven surface  activity   Assist Walk 10 feet on uneven surfaces activity did not occur: Safety/medical concerns         Wheelchair     Assist Is the patient using a wheelchair?: Yes Type of Wheelchair: Manual Wheelchair activity did not occur: Safety/medical concerns         Wheelchair 50 feet with 2 turns activity    Assist    Wheelchair 50 feet with 2 turns activity did not occur: Safety/medical concerns       Wheelchair 150 feet activity     Assist  Wheelchair 150 feet activity did not occur: Safety/medical concerns       Blood pressure 96/67, pulse 93, temperature 98.5 F (36.9 C), resp. rate 17, height 5\' 4"  (1.626 m), weight 77.1 kg, SpO2 100%, unknown if currently breastfeeding.   Medical Problem List and Plan: 1. 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             -patient may  shower- cover PEG             -ELOS/Goals: 3-4 weeks mod to max A             -Continue CIR 2.  Antithrombotics: -DVT/anticoagulation:  Pharmaceutical:  Eliquis 5mg  BID             -antiplatelet therapy: N/A 3. Pain Management: Neurontin 100 mg 3 times daily, Topamax 25 mg twice daily, oxycodone 5 mg every 4 hours as needed 4. Mood/Behavior/Sleep: Klonopin 0.5 mg nightly, melatonin 5 mg nightly as needed -antipsychotic agents: suggest Depakote or increase in gabapentin to help calm pt down;  5. Neuropsych/cognition: This  patient is not capable of making decisions on her own behalf. 6. Skin/Wound Care: routine skin care for PEG and overall skin 7. Fluids/Electrolytes/Nutrition: will assess labs, monitor I&Os, cont supplements -03/29/23 K a little low 3.4, see #18; Na improved. BMP otherwise fairly unremarkable.  8.  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  Followed by oncology/hematology   9.  Anemia/thrombocytopenia/iron deficiency/folic acid deficiency/B12 deficiency.  Follow-up CBC.  Continue Niferex 150mg  daily, and supplements -1/24 hemoglobin a little lower again at 8.9, platelets overall stable at 79.  Not sure if some of hemoglobin decrease is delutional. Check occult blood-- not yet done 03/29/23   10.  Dysphagia.  Status post gastrostomy tube 03/21/2023 per interventional radiology.  Diet has been advanced to regular.  Dietary follow-up- still on TF's because not eating well.  -03/29/23 advancing diet to full liquid today, if tolerating then soft diet tonight  11.  E. coli UTI.  Antibiotic therapy completed 12.  Tobacco/alcohol use.  Counseling 13. Severe RUE spasticity- would strongly suggest Botox of RUE soon as well as resting hand splint for R wrist and R PRAFO- will order.   14. Abd pain- from PEG vs constipation- if no BM, might need KUB.  -IR consult to assess Gtube site.  Pus noted at skin site. Appears CT scan is ordered -Likely G-tube infection.  She is going for a IR  procedure today.  Hospitalist team following-appreciate assistance.  She has been started on vancomycin and ceftriaxone for abdominal infection.  Echocardiogram and cultures have been ordered.  Spoke with hospitalist today, ID has been consulted. -1/24 ID recommending vancomycin for 7 days total and can use linezolid if discharged prior to this -03/29/23 appreciate hospitalist help, repeat BCx neg so far, ID signed off; looks like abd xray ordered, pending. Advancing diet as above.    15.  Elevated transaminases-mild  -1/24 continued mild elevation, continue to monitor  16.  Hyponatremia  -Currently on IVF, hospitalist consulted  -Will DC free water per tube   -1/24 improved to 140-- 138 1/25  17.  Ileus  -Hospitalist consulted, continue IVF, discontinued tube feeds  -1/23 improving.  She had bowel movements today and yesterday -1/24 patient perseverating on having diet.  Will start with trickle feeds today. Fleet enema ordered -03/29/23 see #14, LBM yesterday, abd xray pending, advancing diet, cont miralax BID, simethicone 40mg  QID, hospitalist increased colace to 200mg  BID  18. Hypokalemia   -30 mEq twice daily for 2 doses  -03/29/23 K 3.4, given this morning, monitor      LOS: 4 days A FACE TO FACE EVALUATION WAS PERFORMED  [address] 03/29/2023, 11:49 AM

## 2023-03-30 LAB — BASIC METABOLIC PANEL
Anion gap: 10 (ref 5–15)
BUN: 15 mg/dL (ref 6–20)
CO2: 19 mmol/L — ABNORMAL LOW (ref 22–32)
Calcium: 8.6 mg/dL — ABNORMAL LOW (ref 8.9–10.3)
Chloride: 109 mmol/L (ref 98–111)
Creatinine, Ser: 0.93 mg/dL (ref 0.44–1.00)
GFR, Estimated: >60 mL/min (ref >60)
Glucose, Bld: 127 mg/dL — ABNORMAL HIGH (ref 70–99)
Potassium: 4 mmol/L (ref 3.5–5.1)
Sodium: 138 mmol/L (ref 135–145)

## 2023-03-30 MED ORDER — VANCOMYCIN HCL 750 MG IV SOLR
750.0000 mg | Freq: Three times a day (TID) | INTRAVENOUS | Status: AC
  Administered 2023-03-30 – 2023-04-02 (×11): 750 mg via INTRAVENOUS
  Filled 2023-03-30 (×11): qty 15

## 2023-03-30 MED ORDER — SORBITOL 70 % SOLN
30.0000 mL | Freq: Once | Status: DC
  Filled 2023-03-30: qty 30

## 2023-03-30 NOTE — Progress Notes (Signed)
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 Patient was taken for thrombectomy due to left M2 occlusion. Thrombectomy was successful, but then had worsening stenosis and subsequent complete closure of the left M2. Repeat MRI noted extension of the stroke.  Patient was transition back to Eliquis and had PEG tube placed. She was placed on tube feeds, but also allowed to eat orally. TRH was consulted on 1/22 for medical management of her infection symptoms. Found to have staph epi bacteremia.  ID was consulted as well. Workup found that PEG tube was malpositioned which was replaced on 1/23.  Ileus seems to be resolved.   Assessment and plan. Sepsis due to staph epi bacteremia. MRSE Patient had fever, leukocytosis meeting SIRS criteria. Blood cultures positive for staph epi.  MRSE. Abdominal pain reported by the patient with some irritation around the PEG tube site. PEG tube was exchanged. ID was consulted. Repeat cultures ordered so far negative.  Further workup is not recommended for now. Currently on IV vancomycin for a total of 7 days, last day 1/29. Was on ceftriaxone and Flagyl, now stopped. ID currently signed off.  PEG tube malposition. Patient reported abdominal pain at the peg tube site. With fever and leukocytosis further workup was performed including a CT scan which showed evidence of retraction into the anterior abdominal wall. IR was reconsulted and the patient was taken for replacement of new 20 French gastrostomy tube. Tube is ready  for immediate use. If the patient is able to tolerate oral diet and has adequate oral intake, patient will likely not require PEG tube going forward. Per chart review PEG tube was only placed as the patient had not met calorie requirement based on the calorie count.   Ileus. Constipation. CT scan also shows evidence of possible ileus with dilated bowel. Also significant constipation seen as well. Repeat x ray on 1/25 shows movement of stool and no significant evidence of SBO.  Tolerated soft diet, Will advance the diet regular diet.  For constipation  Continue miralax bid, increase colace to 200 bid. Continue simethicone. Will order 1 dose of sorbitol.  Hypokalemia Replaced.    Hyponatremia. Resolved    Elevated LFT. Minimally elevated. For now we will monitor.   Thrombocytopenia, intermittently present chronically. Folic acid deficiency Iron deficiency anemia Vitamin B12 deficiency  Platelet count chronically low, Now stable. Hematology was consulted during inpatient admission.  B12 122, continue supplement.  Folate 9.9 continue supplement.  Iron was low in December but improved in January   History of lupus anticoagulant. History of PE. Acute CVA. On anticoagulation with Eliquis. Monitor platelets daily.   I will electronically monitor patient tomorrow.  If remaining stable.  I will sign off on 1/27.  Subjective: Remains frustrated with her slow progression in the hospital.  No other acute events.  No BM so far.  Tolerated food from home which include chicken noodles and smoothie.  Physical Exam: Mild distress.  No rash. PEG tube site appears to be without any drainage. Expressive aphasia present.  Data Reviewed: I have Reviewed nursing notes, Vitals, and  Lab results. Reviewed BMP  Vitals:   03/29/23 0600 03/29/23 1300 03/29/23 2044 03/30/23 0424  BP: 96/67 96/70 124/89 (!) 96/54  Pulse: 93 70 (!) 101 98  Resp: 17 18 18 17   Temp: 98.5 F (36.9 C) 98.7 F  (37.1 C) 98.5 F (36.9 C) 97.8 F (36.6 C)  TempSrc:  Oral    SpO2: 100% 100% 100% 100%  Weight:      Height:       Family Communication: No family available.  Author: Lynden Oxford, MD  [facility] 03/30/2023  6:24 PM  To reach On-call, Look up on care teams to locate the [facility] team or provider name and reach out to them via secure chat or [email or web address] Between 7PM-7AM, please contact night-coverage. If you still have difficulty reaching the attending provider, please page the [facility] (Director on Call) for [facility] on amion for assistance.

## 2023-03-30 NOTE — Progress Notes (Signed)
Physical Therapy Session Note  Patient Details  Name: Jacqueline [name] MRN: [id number] Date of Birth: 2000/09/24  Today's Date: 03/30/2023 PT Individual Time: 1001-1115 PT Individual Time Calculation (min): 74 min   Short Term Goals: Week 1:  PT Short Term Goal 1 (Week 1): pt will transfer supine<>sitting EOB with mod A of 1 PT Short Term Goal 2 (Week 1): pt will transfer sit<>stand with LRAD and mod A of 1 PT Short Term Goal 3 (Week 1): pt will ambulate 30ft with LRAD and max A of 1  Skilled Therapeutic Interventions/Progress Updates:      Pt supine in bed upon arrival. Pt agreeable to therapy. Pt reports abdominal pain at site of feeding tube, notified nursing during session. Nurse present to assess and provide pain medication. PT requested lengthening tubing of feeding tube if possible. Difficulty with communication throughout due to aphasia but able to follow simple commands easily   Pt reports need to use bathroom. Supine to sit-sit to supine with mod-max A for management of R LE and trunk. Stand pivot transfer bed to BSC, BSC to WC, WC to BSC with +2 A to assist with donning/doffing pants and brief/pericare.  Sit to stand throughout session with mod-max A, with therapist assisting with positioning and approximation of R LE.   Gait x8 feet with L UE on handrail, ace bandage on R foot, and therpapist providing total A for R LE advancement with +2 provided for WC follow and management of IV for feeding tube. Discontinued 2/2 discomfort with feeding tube.   Pt supine in bed with all needs within reach and bed alarm on.     Therapy Documentation Precautions:  Precautions Precautions: Fall Precaution Comments: PEG, expressive aphasia with agitation, Rt hemi Restrictions Weight Bearing Restrictions Per Provider Order: No   Therapy/Group: Individual Therapy  [facility] Ambrose Finland, [place], DPT  03/30/2023, 7:39 AM

## 2023-03-30 NOTE — Progress Notes (Signed)
PROGRESS NOTE   Subjective/Complaints:  Pt doing ok again today, endorses she slept ok. Slept ok. OT reported pt indicated R arm tingling but seems manageable. On menses, some abd cramping, but denies much pain there. LBM 2d ago. Shakes her head no when asked if she has any CP, SOB, n/v, but somewhat of an unreliable historian.  Wants to eat, asked what she likes and she said "NOT that" pointing to her tray. Advised we'll have nursing staff come give her options to see what she might want.   ROS: Limited by aphasia   Objective:   DG Abd Portable 1V Result Date: 03/29/2023 CLINICAL DATA:  Ileus. EXAM: PORTABLE ABDOMEN - 1 VIEW COMPARISON:  One-view abdomen 03/28/2023. FINDINGS: Oral contrast has progressed to the level of the rectum. The bowel gas pattern is normal. Gastric tube is stable. The lung bases are clear. IMPRESSION: 1. Oral contrast has progressed to the level of the rectum. 2. Normal bowel gas pattern. Electronically Signed   By: Marin Roberts M.D.   On: 03/29/2023 13:15   Recent Labs    03/27/23 1404 03/28/23 1311  WBC 9.9 5.0  HGB 9.7* 8.9*  HCT 27.5* 25.8*  PLT 72* 79*   Recent Labs    03/29/23 0640 03/30/23 0635  NA 138 138  K 3.4* 4.0  CL 106 109  CO2 21* 19*  GLUCOSE 110* 127*  BUN 12 15  CREATININE 0.81 0.93  CALCIUM 8.7* 8.6*    Intake/Output Summary (Last 24 hours) at 03/30/2023 1235 Last data filed at 03/29/2023 1300 Gross per 24 hour  Intake 0 ml  Output --  Net 0 ml        Physical Exam: Vital Signs Blood pressure (!) 96/54, pulse 98, temperature 97.8 F (36.6 C), resp. rate 17, height 5\' 4"  (1.626 m), weight 77.1 kg, SpO2 100%, unknown if currently breastfeeding.   General: NAD, sitting in w/c working with OT.  HEENT: Head is normocephalic, atraumatic, mucous membranes moist Neck: Supple without JVD or lymphadenopathy Heart: Reg rate and rhythm. No murmurs rubs or  gallops Chest: CTA bilaterally without wheezes, rales, or rhonchi; no distress Abdomen: Positive bowel sounds, abdomen is soft, no significant tenderness, G tube c/d/i.  Urostomy tube in place. Extremities: No clubbing, cyanosis, or edema. Pulses are 2+ Psych: Appears frustrated at times when trying to speak Skin: Clean and intact without signs of breakdown over exposed surfaces, Gtube C/D/I Neuro: expressive aphasia MsK: RUE/RLE flaccid, moves LUE/LLE antigravity  PRIOR EXAMS: Musculoskeletal:     Cervical back: Normal range of motion and neck supple.     Comments: LUE/LLE moving well again gravity RUE- no voluntary movement seen of RUE or RLE   Neurological:     Comments: Patient came back from working with therapy.  Awake and alert.  She does make eye contact with examiner.  Increased verbal output today. MAS of 3-4 in R shoulder esp External rotation; MAS of 4 in R elbow- lacking 45 degrees of extension with a lot of work; MAS of 2-3 of R hand- in fist at rest, but could completely open fingers and get past wrist neutral.  Difficult to assess sensation  Assessment/Plan: 1. Functional deficits which require 3+ hours per day of interdisciplinary therapy in a comprehensive inpatient rehab setting. Physiatrist is providing close team supervision and 24 hour management of active medical problems listed below. Physiatrist and rehab team continue to assess barriers to discharge/monitor patient progress toward functional and medical goals  Care Tool:  Bathing    Body parts bathed by patient: Right arm, Chest, Abdomen, Front perineal area   Body parts bathed by helper: Left arm, Buttocks, Right upper leg, Left upper leg, Right lower leg, Left lower leg, Face     Bathing assist Assist Level: Maximal Assistance - Patient 24 - 49%     Upper Body Dressing/Undressing Upper body dressing   What is the patient wearing?: Pull over shirt    Upper body assist Assist Level: Maximal  Assistance - Patient 25 - 49%    Lower Body Dressing/Undressing Lower body dressing      What is the patient wearing?: Incontinence brief, Pants     Lower body assist Assist for lower body dressing: Total Assistance - Patient < 25%     Toileting Toileting    Toileting assist Assist for toileting: 2 Helpers     Transfers Chair/bed transfer  Transfers assist  Chair/bed transfer activity did not occur: Safety/medical concerns (unsafe to get up)  Chair/bed transfer assist level: 2 Helpers     Locomotion Ambulation   Ambulation assist      Assist level: 2 helpers Assistive device: Other (comment) (3 muskateer assist + chair follow) Max distance: 30'   Walk 10 feet activity   Assist     Assist level: 2 helpers Assistive device: Other (comment) (Rail)   Walk 50 feet activity   Assist Walk 50 feet with 2 turns activity did not occur: Safety/medical concerns         Walk 150 feet activity   Assist Walk 150 feet activity did not occur: Safety/medical concerns         Walk 10 feet on uneven surface  activity   Assist Walk 10 feet on uneven surfaces activity did not occur: Safety/medical concerns         Wheelchair     Assist Is the patient using a wheelchair?: Yes Type of Wheelchair: Manual Wheelchair activity did not occur: Safety/medical concerns         Wheelchair 50 feet with 2 turns activity    Assist    Wheelchair 50 feet with 2 turns activity did not occur: Safety/medical concerns       Wheelchair 150 feet activity     Assist  Wheelchair 150 feet activity did not occur: Safety/medical concerns       Blood pressure (!) 96/54, pulse 98, temperature 97.8 F (36.6 C), resp. rate 17, height 5\' 4"  (1.626 m), weight 77.1 kg, SpO2 100%, unknown if currently breastfeeding.   Medical Problem List and Plan: 1. 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 IR  revascularization/thrombectomy             -patient may  shower- cover PEG             -ELOS/Goals: 3-4 weeks mod to max A             -Continue CIR 2.  Antithrombotics: -DVT/anticoagulation:  Pharmaceutical: Eliquis 5mg  BID             -antiplatelet therapy: N/A 3. Pain Management: Neurontin 100 mg 3 times daily, Topamax 25 mg twice daily, oxycodone 5  mg every 4 hours as needed -03/30/23 some tingling in R arm, positional? Manageable with meds. OT working on releasing scapula and helping with positioning; monitor 4. Mood/Behavior/Sleep: Klonopin 0.5 mg nightly, melatonin 5 mg nightly as needed -antipsychotic agents: suggest Depakote or increase in gabapentin to help calm pt down;  5. Neuropsych/cognition: This patient is not capable of making decisions on her own behalf. 6. Skin/Wound Care: routine skin care for PEG and overall skin 7. Fluids/Electrolytes/Nutrition: will assess labs, monitor I&Os, cont supplements -03/29/23 K a little low 3.4, see #18; Na improved. BMP otherwise fairly unremarkable.  -03/30/23 K 4.0 today, BMP otherwise ok today, monitor 8.  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  Followed by oncology/hematology   9.  Anemia/thrombocytopenia/iron deficiency/folic acid deficiency/B12 deficiency.  Follow-up CBC.  Continue Niferex 150mg  daily, and supplements -1/24 hemoglobin a little lower again at 8.9, platelets overall stable at 79.  Not sure if some of hemoglobin decrease is delutional. Check occult blood-- not yet done 03/29/23   10.  Dysphagia.  Status post gastrostomy tube 03/21/2023 per interventional radiology.  Diet has been advanced to regular.  Dietary follow-up- still on TF's because not eating well.  -03/29/23 advancing diet to full liquid today, if tolerating then soft diet tonight -03/30/23 tolerating eating, but  doesn't like food options; asked nurse to give her options that she can point to to help with communication of her food choices-- she's motivated to eat, we just need to find what she likes and then hopefully she can get the Gtube out.   11.  E. coli UTI.  Antibiotic therapy completed 12.  Tobacco/alcohol use.  Counseling 13. Severe RUE spasticity- would strongly suggest Botox of RUE soon as well as resting hand splint for R wrist and R PRAFO- will order.   14. Abd pain- from PEG vs constipation- if no BM, might need KUB.  -IR consult to assess Gtube site.  Pus noted at skin site. Appears CT scan is ordered -Likely G-tube infection.  She is going for a IR procedure today.  Hospitalist team following-appreciate assistance.  She has been started on vancomycin and ceftriaxone for abdominal infection.  Echocardiogram and cultures have been ordered.  Spoke with hospitalist today, ID has been consulted. -1/24 ID recommending vancomycin for 7 days total and can use linezolid if discharged prior to this -03/29/23 appreciate hospitalist help, repeat BCx neg so far, ID signed off; looks like abd xray ordered, pending. Advancing diet as above.  -03/30/23 no abd pain today, other than menstrual cramping, has tylenol if needed; abd xray yesterday fine. Hospitalist still following, no note yet today. Appreciate their assistance.    15.  Elevated transaminases-mild  -1/24 continued mild elevation, continue to monitor  16.  Hyponatremia  -Currently on IVF, hospitalist consulted  -Will DC free water per tube   -1/24 improved to 140-- 138 1/25  17.  Ileus  -Hospitalist consulted, continue IVF, discontinued tube feeds  -1/23 improving.  She had bowel movements today and yesterday -1/24 patient perseverating on having diet.  Will start with trickle feeds today. Fleet enema ordered -03/29/23 see #14, LBM yesterday, abd xray pending, advancing diet, cont miralax BID, simethicone 40mg  QID, hospitalist increased  colace to 200mg  BID -03/30/23 LBM 1/24, soft diet currently, abd xray fine yesterday; cont regimen  18. Hypokalemia   -30 mEq twice daily for 2 doses  -03/29/23 K  3.4, given this morning, monitor  -03/30/23 K 4.0, monitor with labs.       LOS: 5 days A FACE TO FACE EVALUATION WAS PERFORMED  [address] 03/30/2023, 12:35 PM

## 2023-03-30 NOTE — Progress Notes (Signed)
Occupational Therapy Session Note  Patient Details  Name: Jacqueline [name] MRN: [id number] Date of Birth: Jan 15, 2001  Today's Date: 03/30/2023 OT Individual Time: 4098-1191 OT Individual Time Calculation (min): 75 min    Short Term Goals: Week 1:  OT Short Term Goal 1 (Week 1): Pt will sit EOB for ADLs for > 3 mins with mod A for sitting balance OT Short Term Goal 2 (Week 1): Pt will complete functional toilet transfer with max A using LRAD OT Short Term Goal 3 (Week 1): Pt with attend to RUE during ADL task with min verbal cues during functional task OT Short Term Goal 4 (Week 1): Pt will donn LB clothing with max A using AE PRN  Skilled Therapeutic Interventions/Progress Updates:     Patient in bed asleep at the time of arrival.  Patient willing to participate, however, she is easily frustrated secondary to challenge with expressive aphasia.  The pt was having her cycle and c/o severe cramps, nursing was informed and was able to provide medication to address her pain. The pt was able to come from supine in bed to EOB with MaxAx2.  The pt was transferred to w/c LOF using the stedy at [facility] for coming from sit to stand and placement in the w/c.  The pt was transported to the sink area and was able to wash her face and brush her teeth.  I attempted to perform manual manipulation of the R scapular and surrounding structures, but the pt's pain response limited my abilities.  The pt tolerated PROM of the RUE in various planes without grimace or verbal response. The pt was returned to bed LOF using the stedy with MaxAx2, the call light was placed nearbr and all additional needs addressed prior to exiting the room .  Therapy Documentation Precautions:  Precautions Precautions: Fall Precaution Comments: PEG, expressive aphasia with agitation, Rt hemi Restrictions Weight Bearing Restrictions Per Provider Order: No  Therapy/Group: Individual Therapy  Lavona Mound 03/30/2023, 3:47 PM

## 2023-03-30 NOTE — Progress Notes (Signed)
Occupational Therapy Session Note  Patient Details  Name: Jacqueline [name] MRN: [id number] Date of Birth: 07-25-2000  Today's Date: 03/30/2023 OT Individual Time: 1350-1400 OT Individual Time Calculation (min): 10 min  and Today's Date: 03/30/2023 OT Missed Time: 50 Minutes Missed Time Reason: Patient fatigue;Patient unwilling/refused to participate without medical reason  Short Term Goals: Week 1:  OT Short Term Goal 1 (Week 1): Pt will sit EOB for ADLs for > 3 mins with mod A for sitting balance OT Short Term Goal 2 (Week 1): Pt will complete functional toilet transfer with max A using LRAD OT Short Term Goal 3 (Week 1): Pt with attend to RUE during ADL task with min verbal cues during functional task OT Short Term Goal 4 (Week 1): Pt will donn LB clothing with max A using AE PRN  Skilled Therapeutic Interventions/Progress Updates:  Pt greeted resting in bed, multiple attempts at arousing patient, even moving RUE to re-position. Pt briefly opens eyes, but does not sustain level of alertness to progress with session at scheduled time. Time dedicated to adjusting lines/leads, including short PEG tube line to avoid potential pulling.   Pt remained resting in bed with 4Ps assessed and immediate needs met. Pt continues to be appropriate for skilled OT intervention to promote further functional independence in ADLs/IADLs.   Therapy Documentation Precautions:  Precautions Precautions: Fall Precaution Comments: PEG, expressive aphasia with agitation, Rt hemi Restrictions Weight Bearing Restrictions Per Provider Order: No   Therapy/Group: Individual Therapy  Lou Cal, OTR/L, MSOT  03/30/2023, 6:31 AM

## 2023-03-31 ENCOUNTER — Encounter (HOSPITAL_COMMUNITY): Payer: Self-pay | Admitting: Physical Medicine and Rehabilitation

## 2023-03-31 ENCOUNTER — Other Ambulatory Visit: Payer: Self-pay

## 2023-03-31 LAB — CBC
HCT: 25.7 % — ABNORMAL LOW (ref 36.0–46.0)
Hemoglobin: 8.8 g/dL — ABNORMAL LOW (ref 12.0–15.0)
MCH: 27.6 pg (ref 26.0–34.0)
MCHC: 34.2 g/dL (ref 30.0–36.0)
MCV: 80.6 fL (ref 80.0–100.0)
Platelets: 90 10*3/uL — ABNORMAL LOW (ref 150–400)
RBC: 3.19 MIL/uL — ABNORMAL LOW (ref 3.87–5.11)
RDW: 21.4 % — ABNORMAL HIGH (ref 11.5–15.5)
WBC: 5.3 10*3/uL (ref 4.0–10.5)
nRBC: 0 % (ref 0.0–0.2)

## 2023-03-31 LAB — BASIC METABOLIC PANEL
Anion gap: 11 (ref 5–15)
BUN: 12 mg/dL (ref 6–20)
CO2: 18 mmol/L — ABNORMAL LOW (ref 22–32)
Calcium: 8.6 mg/dL — ABNORMAL LOW (ref 8.9–10.3)
Chloride: 107 mmol/L (ref 98–111)
Creatinine, Ser: 0.75 mg/dL (ref 0.44–1.00)
GFR, Estimated: >60 mL/min (ref >60)
Glucose, Bld: 90 mg/dL (ref 70–99)
Potassium: 3.6 mmol/L (ref 3.5–5.1)
Sodium: 136 mmol/L (ref 135–145)

## 2023-03-31 LAB — VITAMIN D 25 HYDROXY (VIT D DEFICIENCY, FRACTURES): Vit D, 25-Hydroxy: 74.61 ng/mL (ref 30–100)

## 2023-03-31 MED ORDER — POTASSIUM CHLORIDE 20 MEQ PO PACK
40.0000 meq | PACK | Freq: Once | ORAL | Status: AC
  Administered 2023-03-31: 40 meq via ORAL
  Filled 2023-03-31: qty 2

## 2023-03-31 MED ORDER — PROSOURCE TF20 ENFIT COMPATIBL EN LIQD: 60.0000 mL | Freq: Every day | ENTERAL | Status: DC

## 2023-03-31 MED ORDER — ENSURE ENLIVE PO LIQD
237.0000 mL | Freq: Two times a day (BID) | ORAL | Status: DC
  Administered 2023-03-31 – 2023-04-02 (×3): 237 mL via ORAL

## 2023-03-31 NOTE — Progress Notes (Addendum)
Nutrition Follow-up  DOCUMENTATION CODES:   Not applicable  INTERVENTION:   Encourage PO intake, Room service with assist Discontinue  PEG TF and monitor PO intake  MVI with minerals daily Ensure Enlive po BID, each supplement provides 350 kcal and 20 grams of protein. (Prefers SB per family) Magic cup TID with meals, each supplement provides 290 kcal and 9 grams of protein Nighttime snack  NUTRITION DIAGNOSIS:   Inadequate oral intake related to lethargy/confusion as evidenced by meal completion < 50%.  - Still applicable  GOAL:   Patient will meet greater than or equal to 90% of their needs  - Progressing   MONITOR:   PO intake, TF tolerance  REASON FOR ASSESSMENT:   Consult Enteral/tube feeding initiation and management  ASSESSMENT:   Pt with PMH of Kells isoimmunization pregnancy, unprovoked PE with positive lupus anticoagulant, hemoglobin C trait, anemia iron deficient thrombocytopenia, tobacco use, has two children ages 58 and 57. 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  12/20 - admitted with expressive aphasia and right sided weakness (previous night drank significant amount of ETOH and took Suboxone) dx with L MCA and watershed territory infarcts with failed attempt at mechanical thrombectomy.  12/26 - MRI showed increased infarcts.  1/17 - PEG placed 1/21 - admitted to rehab 1/22 - Bolus TF stopped due to misplaced PEG 1/23- PEG tube replaced, 1/24 -  trickle feeds due to ileus  1/27 - D/C TF   Patient tolerated soft diet over the weekend and was advanced to a regular diet yesterday.  Patient tolerating trickle TF. She was able to communicate some but limited due to her aphasia. Family in room at bedside, brought in [employer] for lunch that patient was actively eating. They report she had eggs and bacon for breakfast.   Patient wants to eat but family reports she was not liking what was being brought to her. She was being provided house trays. I believe  patient will be able to communicate what she wants for her meals if prompted, will place room service with assist.   Patient not willing to try any supplements but both family and I encouraged her to try them to increase her PO intake. Will add and monitor supplement adherence.   Messaged RN about plan of care and reminded her to still flush PEG with minimal water flushes from protocol and to document % meals in EHR.   - Needs repeat NFPE  Admit weight: 72.6 kg Current weight: 77.1 kg     Average Meal Intake: 1/27: 60% intake x 1 recorded meals  Nutritionally Relevant Medications: Scheduled Meds:  vitamin B-12  2,000 mcg Oral Daily   docusate  200 mg Per Tube BID   folic acid  1 mg Oral Daily   free water  200 mL Per Tube Q4H   gabapentin  100 mg Oral TID   iron polysaccharides  150 mg Per Tube Daily   multivitamin with minerals  1 tablet Per Tube Daily   polyethylene glycol  17 g Per Tube BID   potassium chloride  40 mEq Oral Once   simethicone  40 mg Per Tube QID   sorbitol  30 mL Per Tube Once   Continuous Infusions:  vancomycin 750 mg (03/31/23 1022)   Labs Reviewed: Calcium 8.6, Vitamin B12 122 NO recent CBG  Diet Order:   Diet Order             Diet regular Room service appropriate? Yes; Fluid consistency: Thin  Diet effective now                   EDUCATION NEEDS:   No education needs have been identified at this time  Skin:  Skin Assessment: Reviewed RN Assessment  Last BM:  1/23 type 7  Height:   Ht Readings from Last 1 Encounters:  03/25/23 5\' 4"  (1.626 m)    Weight:   Wt Readings from Last 1 Encounters:  03/25/23 77.1 kg    Ideal Body Weight:  54.5 kg  BMI:  Body mass index is 29.18 kg/m.  Estimated Nutritional Needs:   Kcal:  2000-2200  Protein:  100-115 grams  Fluid:  >2L/day  Elliot Dally, RD Registered Dietitian  See Amion for more information

## 2023-03-31 NOTE — Plan of Care (Signed)
  Problem: Consults Goal: RH STROKE PATIENT EDUCATION Description: See Patient Education module for education specifics  Outcome: Progressing   Problem: RH BOWEL ELIMINATION Goal: RH STG MANAGE BOWEL WITH ASSISTANCE Description: STG Manage Bowel with medications with minimal Assistance. Outcome: Progressing   Problem: RH BLADDER ELIMINATION Goal: RH STG MANAGE BLADDER WITH ASSISTANCE Description: STG Manage Bladder with minimal Assistance Outcome: Progressing   Problem: RH SAFETY Goal: RH STG ADHERE TO SAFETY PRECAUTIONS W/ASSISTANCE/DEVICE Description: STG Adhere to Safety Precautions with min A Outcome: Progressing   Problem: RH PAIN MANAGEMENT Goal: RH STG PAIN MANAGED AT OR BELOW PT'S PAIN GOAL Description: <4w/ prns Outcome: Progressing   Problem: RH KNOWLEDGE DEFICIT Goal: RH STG INCREASE KNOWLEDGE OF STROKE PROPHYLAXIS Description: Manage stroke prophylaxis with min A Outcome: Progressing   Problem: RH BOWEL ELIMINATION Goal: RH STG MANAGE BOWEL W/MEDICATION W/ASSISTANCE Description: STG Manage Bowel with Medication with Assistance. Outcome: Progressing

## 2023-03-31 NOTE — Progress Notes (Signed)
Physical Therapy Session Note  Patient Details  Name: Jacqueline [name] MRN: [id number] Date of Birth: Jul 17, 2000  Today's Date: 03/31/2023 PT Individual Time: 1300-1408 PT Individual Time Calculation (min): 68 min  Today's Date: 03/31/2023 PT Missed Time: 7 Minutes Missed Time Reason: Increased agitation  Short Term Goals: Week 1:  PT Short Term Goal 1 (Week 1): pt will transfer supine<>sitting EOB with mod A of 1 PT Short Term Goal 2 (Week 1): pt will transfer sit<>stand with LRAD and mod A of 1 PT Short Term Goal 3 (Week 1): pt will ambulate 58ft with LRAD and max A of 1  Skilled Therapeutic Interventions/Progress Updates:   Received pt semi-reclined in bed with family at bedside. Pt agreeable to PT treatment and c/o pain surrounding PEG site. Pt with continued expressive aphasia and significantly increased time spent trying to understand pt's wants/needs throughout session. Pt very easily frustrated this afternoon, shutting down, and responding to therapist with "whatever" and avoiding eye contact during session. Session with emphasis on functional mobility/transfers, generalized strengthening and endurance, dynamic standing balance/coordination, and gait training. Pt transferred semi-reclined<>sitting R EOB with HOB elevated and use of bedrails with mod A for RLE management. Significantly increased time spent donning shoes due to tight fit - required shoe horn. Pt transferred bed<>WC via squat<>pivot with mod A (cues to scoot hips all the way back and into WC), then stood holding onto bedrail with mod A to adjust pants.   Pt transported to/from room in [facility] dependently for time management purposes. Stood with L railing and mod A and ambulated 57ft with L handrail and mod A +2 for equipment management. Pt required assist to advance/place RLE and mod blocking at R knee from buckling and hyperextension. Pt limited by episode rolling R ankle, resulting in increased pain - requested order for aircast  from PA. Pt then ambulated additional 27ft with L handrail and mod A +2 for equipment management with same assist mentioned above with caution to prevnet ankle from rolling. Pt then refused any further ambulation, reporting increased bilateral foot pain, ultimately requesting to remove shoes. Pt insisted on returning to room and returning to bed. Transferred WC<>bed stand<>pivot with mod A and sit<>supine with max A for RLE management. Scooted to [facility] with +2 assist - pt frustrated and refused to assist, stating "no" when therapist asked pt to push with her legs to boost up in bed. Concluded session with pt semi-reclined in bed, needs within reach, and bed alarm on. Elevated RLE on pillow for edema management and provided pt with ice pack for pain relief. RN notified of pt's request for pain medicine and discussed with boyfriend Jacqueline [name] getting pt size 6.5 shoe. 7 minutes missed of skilled physical therapy due to frustration/agitation.   Therapy Documentation Precautions:  Precautions Precautions: Fall Precaution Comments: PEG, expressive aphasia with agitation, Rt hemi Restrictions Weight Bearing Restrictions Per Provider Order: No  Therapy/Group: Individual Therapy Marlana Salvage Zaunegger Blima Rich PT, DPT 03/31/2023, 7:04 AM

## 2023-03-31 NOTE — Progress Notes (Signed)
Orthopedic Tech Progress Note Patient Details:  Jacqueline [name] 05-Nov-2000 [id number]  Ortho Devices Type of Ortho Device: Ankle Air splint, Wrist splint Ortho Device/Splint Location: RUE,RLE Ortho Device/Splint Interventions: Ordered, Application, Adjustment removed RESTING HAND SPLINT from right wrist and put it on the dresser draw near the sink with aircast.   Post Interventions Patient Tolerated: Well, Fair Instructions Provided: Care of device  Donald Pore 03/31/2023, 3:38 PM

## 2023-03-31 NOTE — Plan of Care (Signed)
1815: Pt IV in right upper arm infiltrated while running vancomycin, RN stopped infusion, RN removed IV, and notified Reuel Boom Angiulli of infiltration. RN called pharmacy and notified them of infiltration, for now per recommendation, RN placed Ice pack to help with swelling

## 2023-03-31 NOTE — Progress Notes (Signed)
PROGRESS NOTE   Subjective/Complaints: Sleepy this morning Tolerated therapy well Decreased feeding supplement to daily No issues overnight  ROS: Limited by aphasia   Objective:   No results found.  Recent Labs    03/31/23 0612  WBC 5.3  HGB 8.8*  HCT 25.7*  PLT 90*   Recent Labs    03/30/23 0635 03/31/23 0612  NA 138 136  K 4.0 3.6  CL 109 107  CO2 19* 18*  GLUCOSE 127* 90  BUN 15 12  CREATININE 0.93 0.75  CALCIUM 8.6* 8.6*    Intake/Output Summary (Last 24 hours) at 03/31/2023 1322 Last data filed at 03/31/2023 0900 Gross per 24 hour  Intake 240 ml  Output --  Net 240 ml        Physical Exam: Vital Signs Blood pressure (!) 94/55, pulse 89, temperature 97.9 F (36.6 C), temperature source Oral, resp. rate 18, height 5\' 4"  (1.626 m), weight 77.1 kg, SpO2 100%, unknown if currently breastfeeding.   General: NAD, sitting in w/c working with OT.  HEENT: Head is normocephalic, atraumatic, mucous membranes moist Neck: Supple without JVD or lymphadenopathy Heart: Reg rate and rhythm. No murmurs rubs or gallops Chest: CTA bilaterally without wheezes, rales, or rhonchi; no distress Abdomen: Positive bowel sounds, abdomen is soft, no significant tenderness, G tube c/d/i.  Urostomy tube in place. Extremities: No clubbing, cyanosis, or edema. Pulses are 2+ Psych: Appears frustrated at times when trying to speak Skin: Clean and intact without signs of breakdown over exposed surfaces, Gtube C/D/I Neuro: expressive aphasia MsK: RUE/RLE flaccid, moves LUE/LLE antigravity Musculoskeletal:     Cervical back: Normal range of motion and neck supple.     Comments: LUE/LLE moving well again gravity RUE- no voluntary movement seen of RUE or RLE   Neurological:     Comments: Patient came back from working with therapy.  Awake and alert.  She does make eye contact with examiner.  Increased verbal output today. MAS  of 3-4 in R shoulder esp External rotation; MAS of 4 in R elbow- lacking 45 degrees of extension with a lot of work; MAS of 2-3 of R hand- in fist at rest, but could completely open fingers and get past wrist neutral.  Difficult to assess sensation, stable 03/31/23      Assessment/Plan: 1. Functional deficits which require 3+ hours per day of interdisciplinary therapy in a comprehensive inpatient rehab setting. Physiatrist is providing close team supervision and 24 hour management of active medical problems listed below. Physiatrist and rehab team continue to assess barriers to discharge/monitor patient progress toward functional and medical goals  Care Tool:  Bathing    Body parts bathed by patient: Right arm, Chest, Abdomen, Front perineal area   Body parts bathed by helper: Left arm, Buttocks, Right upper leg, Left upper leg, Right lower leg, Left lower leg, Face     Bathing assist Assist Level: Maximal Assistance - Patient 24 - 49%     Upper Body Dressing/Undressing Upper body dressing   What is the patient wearing?: Pull over shirt    Upper body assist Assist Level: Maximal Assistance - Patient 25 - 49%    Lower Body  Dressing/Undressing Lower body dressing      What is the patient wearing?: Incontinence brief, Pants     Lower body assist Assist for lower body dressing: Total Assistance - Patient < 25%     Toileting Toileting    Toileting assist Assist for toileting: 2 Helpers     Transfers Chair/bed transfer  Transfers assist  Chair/bed transfer activity did not occur: Safety/medical concerns (unsafe to get up)  Chair/bed transfer assist level: 2 Helpers     Locomotion Ambulation   Ambulation assist      Assist level: 2 helpers Assistive device: Other (comment) (3 muskateer assist + chair follow) Max distance: 30'   Walk 10 feet activity   Assist     Assist level: 2 helpers Assistive device: Other (comment) (Rail)   Walk 50 feet  activity   Assist Walk 50 feet with 2 turns activity did not occur: Safety/medical concerns         Walk 150 feet activity   Assist Walk 150 feet activity did not occur: Safety/medical concerns         Walk 10 feet on uneven surface  activity   Assist Walk 10 feet on uneven surfaces activity did not occur: Safety/medical concerns         Wheelchair     Assist Is the patient using a wheelchair?: Yes Type of Wheelchair: Manual Wheelchair activity did not occur: Safety/medical concerns         Wheelchair 50 feet with 2 turns activity    Assist    Wheelchair 50 feet with 2 turns activity did not occur: Safety/medical concerns       Wheelchair 150 feet activity     Assist  Wheelchair 150 feet activity did not occur: Safety/medical concerns       Blood pressure (!) 94/55, pulse 89, temperature 97.9 F (36.6 C), temperature source Oral, resp. rate 18, height 5\' 4"  (1.626 m), weight 77.1 kg, SpO2 100%, unknown if currently breastfeeding.   Medical Problem List and Plan: 1. 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             -patient may  shower- cover PEG             -ELOS/Goals: 3-4 weeks mod to max A             -Continue CIR 2.  Antithrombotics: -DVT/anticoagulation:  Pharmaceutical: Eliquis 5mg  BID             -antiplatelet therapy: N/A 3. Pain Management: Neurontin 100 mg 3 times daily, Topamax 25 mg twice daily, oxycodone 5 mg every 4 hours as needed -03/30/23 some tingling in R arm, positional? Manageable with meds. OT working on releasing scapula and helping with positioning; monitor 4. Mood/Behavior/Sleep: Klonopin 0.5 mg nightly, melatonin 5 mg nightly as needed -antipsychotic agents: suggest Depakote or increase in gabapentin to help calm pt down;  5. Neuropsych/cognition: This patient is not capable of making decisions on her own behalf. 6. Skin/Wound  Care: routine skin care for PEG and overall skin 7. Fluids/Electrolytes/Nutrition: will assess labs, monitor I&Os, cont supplements -03/29/23 K a little low 3.4, see #18; Na improved. BMP otherwise fairly unremarkable.  -03/30/23 K 4.0 today, BMP otherwise ok today, monitor 8.  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  Followed by oncology/hematology   9.  Anemia/thrombocytopenia/iron deficiency/folic  acid deficiency/B12 deficiency.  Follow-up CBC.  Continue Niferex 150mg  daily, and supplements -1/24 hemoglobin a little lower again at 8.9, platelets overall stable at 79.  Not sure if some of hemoglobin decrease is delutional. Check occult blood-- not yet done 03/29/23   10.  Dysphagia.  Status post gastrostomy tube 03/21/2023 per interventional radiology.  Diet has been advanced to regular.  Dietary follow-up- still on TF's because not eating well.  -03/29/23 advancing diet to full liquid today, if tolerating then soft diet tonight -03/30/23 tolerating eating, but doesn't like food options; asked nurse to give her options that she can point to to help with communication of her food choices-- she's motivated to eat, we just need to find what she likes and then hopefully she can get the Gtube out.   11.  E. coli UTI.  Antibiotic therapy completed 12.  Tobacco/alcohol use.  Counseling 13. Severe RUE spasticity- would strongly suggest Botox of RUE soon as well as resting hand splint for R wrist and R PRAFO- will order.   14. Abd pain- from PEG vs constipation- if no BM, might need KUB.  -IR consult to assess Gtube site.  Pus noted at skin site. Appears CT scan is ordered -Likely G-tube infection.  She is going for a IR procedure today.  Hospitalist team following-appreciate assistance.  She has been started on vancomycin and ceftriaxone for abdominal  infection.  Echocardiogram and cultures have been ordered.  Spoke with hospitalist today, ID has been consulted. -1/24 ID recommending vancomycin for 7 days total and can use linezolid if discharged prior to this -03/29/23 appreciate hospitalist help, repeat BCx neg so far, ID signed off; looks like abd xray ordered, pending. Advancing diet as above.  -03/30/23 no abd pain today, other than menstrual cramping, has tylenol if needed; abd xray yesterday fine. Hospitalist still following, no note yet today. Appreciate their assistance.    15.  Elevated transaminases-mild  Kpad ordered to minimize tylenol use  16.  Hyponatremia: resolved, monitor Na weekly.   17.  History of ileus: decrease feeding supplement to daily  18. Hypokalemia: klor 40 ordered 1/27      LOS: 6 days A FACE TO FACE EVALUATION WAS PERFORMED  Jacqueline [name] P Fani Rotondo 03/31/2023, 1:22 PM

## 2023-03-31 NOTE — Progress Notes (Signed)
Occupational Therapy Session Note  Patient Details  Name: Jacqueline [name] MRN: [id number] Date of Birth: [birth year]  Today's Date: 03/31/2023 OT Individual Time: 1049-1200 OT Individual Time Calculation (min): 71 min    Short Term Goals: Week 1:  OT Short Term Goal 1 (Week 1): Pt will sit EOB for ADLs for > 3 mins with mod A for sitting balance OT Short Term Goal 2 (Week 1): Pt will complete functional toilet transfer with max A using LRAD OT Short Term Goal 3 (Week 1): Pt with attend to RUE during ADL task with min verbal cues during functional task OT Short Term Goal 4 (Week 1): Pt will donn LB clothing with max A using AE PRN  Skilled Therapeutic Interventions/Progress Updates:  Skilled OT intervention completed with focus on functional transfers, sit > stands, ADL retraining and R NMR. Pt received upright in bed, agreeable to session. RUE/PEG tube site pain reported intermittently; pre-medicated. OT offered rest breaks and repositioning throughout for pain reduction.  Pt remained expressively aphasic, however responded to one step commands and verbalized a couple of short phrases appropriately during session but with continued frustration, requiring extensive emotional support and calming techniques.  Transitioned to EOB with min A for RLE management and heavy use of bed rails. Able to sit statically and dynamically with supervision during donning of shoes with Max A. Increased time needed for demo of squat pivot for pt to trial vs stand pivot to increase independence when pain/fatigue is higher. Following visual demo, with max multimodal cues, pt able to squat pivot with min A +2 > Lt with LUE on arm rest of w/c. Pt verbalized liking this transfer.   Transported dependently in w/c with +2 needed for w/c and IV management. Sitting at table, pt doffed RUE resting hand splint with mod A. Pt completed the following activities to promote functional use of RUE needed for independence with  BADLs: Table slides on RUE with Lt hand over Rt (x15) -shoulder flexion -horizontal abduction -shoulder external rotation Pt needed assist to follow commands and did easily become frustrated about task but with discussion on stroke recovery, NMR tasks, and purpose, pt more receptive. Pt with minimal activation, but trace noted with shoulder ER and shoulder protraction.  Back in room, pt verbalized "I need to pee." Utilized stedy with mod A needed to stand, then supervision to sit in perched position during dependent transfer > w/c. Min A sit > stand from perched position, and mod A to lower clothing. Pt continent of urinary void, with pt noted to be on menstrual cycle. Pt able to wash UB with mod A, and front peri area with supervision. Direct care handoff to nursing due to OT time constraint. All needs met at end of session.   Therapy Documentation Precautions:  Precautions Precautions: Fall Precaution Comments: PEG, expressive aphasia with agitation, Rt hemi Restrictions Weight Bearing Restrictions Per Provider Order: No    Therapy/Group: Individual Therapy  Melvyn Novas, MS, OTR/L  03/31/2023, 12:28 PM

## 2023-03-31 NOTE — Progress Notes (Signed)
Pt brother came to visit but was not on the list for this pt. Pt mother has called twice asking to speak with this nurse but this nurse was in a room. This nurse called the mother back twice but did not get an answer and voicemail was full so I was not able to leave a message. This nurse did explain to the brother and pt that he was not on the list and would have to be asked to leave. Pt brother was very understandable and complaint. Pt is extremely upset. No further concerns at this time. Call bell in reach.

## 2023-03-31 NOTE — Progress Notes (Signed)
[facility] Consultation Progress Note  Patient: Jacqueline [name] JXB:[id number]   PCP: [name], MD DOB: June 26, 2000   DOA: 03/25/2023   DOS: 03/31/2023   Date of Service:  03/31/2023 no charge note. Primary service: Horton Chin, MD   Brief Hospital Course: Patient with PMH of lupus anticoagulant causing PE, iron deficiency anemia presented to the hospital with complaints of expressive aphasia and was found to have left MCA territory infarct. Patient was taken for thrombectomy due to left M2 occlusion. Thrombectomy was successful, but then had worsening stenosis and subsequent complete closure of the left M2. Repeat MRI noted extension of the stroke.  Patient was transition back to Eliquis and had PEG tube placed. She was placed on tube feeds, but also allowed to eat orally. TRH was consulted on 1/22 for medical management of her infection symptoms. Found to have staph epi bacteremia.  ID was consulted as well. Workup found that PEG tube was malpositioned which was replaced on 1/23.  Ileus seems to be resolved.  Tolerating oral diet. TRH will sign off on 1/27.  Discussed with Dr. Hermelinda Medicus.  Assessment and plan. Sepsis due to staph epi bacteremia. MRSE Patient had fever, leukocytosis meeting SIRS criteria. Blood cultures positive for staph epi.  MRSE. Abdominal pain reported by the patient with some irritation around the PEG tube site. PEG tube was exchanged. ID was consulted. Repeat cultures ordered so far negative.  Further workup is not recommended for now. Currently on IV vancomycin for a total of 7 days, last day 1/29. Was on ceftriaxone and Flagyl, now stopped. ID currently signed off.  PEG tube malposition. Patient reported abdominal pain at the peg tube site. With fever and leukocytosis further workup was performed including a CT scan which showed evidence of retraction into the anterior abdominal wall. IR was reconsulted and the patient was taken for  replacement of new 20 French gastrostomy tube. Tube is ready for immediate use. If the patient is able to tolerate oral diet and has adequate oral intake, patient will likely not require PEG tube going forward. Per chart review PEG tube was only placed as the patient had not met calorie requirement based on the calorie count.   Ileus. Constipation. CT scan also shows evidence of possible ileus with dilated bowel. Also significant constipation seen as well. Repeat x ray on 1/25 shows movement of stool and no significant evidence of SBO.  Tolerating regular diet.  For constipation  Continue miralax bid, increase colace to 200 bid. Continue simethicone. Can reduce the dose once the patient starts having BM.  Hypokalemia Replaced.    Hyponatremia. Resolved    Elevated LFT. Minimally elevated. For now we will monitor clinically.   Thrombocytopenia, intermittently present chronically. Folic acid deficiency Iron deficiency anemia Vitamin B12 deficiency  Platelet count chronically low, Now stable. Hematology was consulted during inpatient admission.  B12 122, continue supplement.  Folate 9.9 continue supplement.  Iron was low in December but improved in January Hemoglobin stable.   History of lupus anticoagulant. History of PE. Acute CVA. On anticoagulation with Eliquis. Monitor platelets daily.   TRH will sign off.  Please call us back with question  Subjective: Seen sitting in the chair no events overnight.  No nausea no vomiting.  Data Reviewed: I have Reviewed nursing notes, Vitals, and Lab results. Reviewed CBC and BMP.  Primary team communication: Discussed with Dr. Hermelinda Medicus.  Author: Lynden Oxford, MD  [facility] 03/31/2023  4:48 PM  To reach On-call, Look  up on care teams to locate the [facility] team or provider name and reach out to them via secure chat or [email or web address] Between 7PM-7AM, please contact night-coverage. If you still have difficulty reaching the  attending provider, please page the [facility] (Director on Call) for [facility] on amion for assistance.

## 2023-03-31 NOTE — Progress Notes (Signed)
Physical Therapy Session Note  Patient Details  Name: Jacqueline [name] MRN: [id number] Date of Birth: [birth year]  Today's Date: 03/31/2023 PT Individual Time: 0916-0959 PT Individual Time Calculation (min): 43 min   Short Term Goals: Week 1:  PT Short Term Goal 1 (Week 1): pt will transfer supine<>sitting EOB with mod A of 1 PT Short Term Goal 2 (Week 1): pt will transfer sit<>stand with LRAD and mod A of 1 PT Short Term Goal 3 (Week 1): pt will ambulate 6ft with LRAD and max A of 1  Skilled Therapeutic Interventions/Progress Updates:     Pt received semi reclined in bed, asleep. Pt awakens to verbal and tactile stimuli. No complaint of pain. Pt verbalizes need to use restroom. Supine to sit with modA and cues for sequencing and positioning at EOB. Pt performs sit to stand and stand pivot transfer to [facility] with modA and cues for initiation, sequencing, and positioning. Pt is continent of bowel and bladder in BSC. Pt stands to New Site with minA and cues for initiation and hand placement. PT provides assistance for pericare in standing. Pt returns to seated and performs several additional reps of sit to stand for donning clean brief. Stedy transfer to [facility] with cues for hand placement and sequencing. Pt performs teeth brushing at sink with setup assistance and PT providing totalA to don bilateral shoes and socks. Pt left seated in WC and agrees to therapy.   Therapy Documentation Precautions:  Precautions Precautions: Fall Precaution Comments: PEG, expressive aphasia with agitation, Rt hemi Restrictions Weight Bearing Restrictions Per Provider Order: No   Therapy/Group: Individual Therapy  Beau Fanny, PT, DPT 03/31/2023, 4:05 PM

## 2023-04-01 NOTE — Progress Notes (Signed)
Occupational Therapy Session Note  Patient Details  Name: Jacqueline [name] MRN: [id number] Date of Birth: [birth year]  Today's Date: 04/01/2023 OT Individual Time: 1050-1200 OT Individual Time Calculation (min): 70 min    Short Term Goals: Week 1:  OT Short Term Goal 1 (Week 1): Pt will sit EOB for ADLs for > 3 mins with mod A for sitting balance OT Short Term Goal 2 (Week 1): Pt will complete functional toilet transfer with max A using LRAD OT Short Term Goal 3 (Week 1): Pt with attend to RUE during ADL task with min verbal cues during functional task OT Short Term Goal 4 (Week 1): Pt will donn LB clothing with max A using AE PRN  Skilled Therapeutic Interventions/Progress Updates:  Skilled OT intervention completed with focus on ADL retraining, functional sit > stands, transfers, and R NMR. Pt received upright in bed. Pt remained aphasic, but with appropriate yes/no responses in first part of session with about 75% accuracy however with onset of fatigue, pt became less responsive. Pt agreeable to session. RUE/PEG placement pain reported with positional changes; pre-medicated. OT offered rest breaks and repositioning throughout for pain reduction.  Transitioned to EOB with min A for RLE and min A for trunk elevation with HHA. Able to sit statically and dynamically with close supervision LB dressing. Cues needed and time for processing hemi technique, but then able to dress RLE with mod A for lifting RLE while pt threaded, then supervision to thread LLE. Stood with mod A using Lt HHA by rehab tech, with OT blocking Rt knee but no true buckling. Pt donned pants over hips with LUE with cues. Attempted to apply Rt ankle air cast, however pt's new (size 7 shoes) still too tight requiring shoe horn for placement. Advised pt to have [name] (boyfriend) return and purchase bigger shoe for functional RLE positioning and accommodate bracing needed. Mod A squat pivot > w/c.  Transported dependently in w/c <>  gym. OT applied saebo stim to the wrist extensors for NMR, tone reduction and pain management with the following parameters: 330 pulse width 35 Hz pulse rate On 8 sec/ off 8 sec Ramp up/ down 2 sec Symmetrical Biphasic wave form  Max intensity at 500 Ohm load -Saebo removed from pt at end of cycle with no adverse skin reaction or irritation noted.  Had pt work on Product/process development scientist while picking up/transferring foam lego block from table <> tower. Pt with little to no activation proximally in shoulder, however anticipate some of the lack of activation was related to pt being disengaged, increased fatigue and non-compliance with activity. Pt verbalized "I don't understand" during task, and OT provided education on NMR purpose and reasons for working on RUE vs just RLE as pt enjoys walking the most and it feels the most meaningful to her. We discussed meaningful daily roles such as caring for her kids that would motivate her to improve her use of RUE in combination with RLE.   Back in room, pt voiced "I need to use bathroom." Utilized stedy for time management, with mod A needed to stand in stedy after cues for pulling up with LUE, supervision for sitting balance during dependent transfer > BSC over toilet. Min A sit > stand from perched position, then mod A to lower clothing. Pt continent of urinary void, and noted to be on menses. Doffed brief, reapplied new one and pad with total A. Direct care handoff to nursing due to OT time constraint with all  immediate needs met.   Therapy Documentation Precautions:  Precautions Precautions: Fall Precaution Comments: PEG, expressive aphasia with agitation, Rt hemi Restrictions Weight Bearing Restrictions Per Provider Order: No    Therapy/Group: Individual Therapy  Melvyn Novas, MS, OTR/L  04/01/2023, 12:18 PM

## 2023-04-01 NOTE — Progress Notes (Signed)
PHARMACY ANTIBIOTIC CONSULT NOTE   Jacqueline [name] a 23 y.o. female with blood cultures positive for MRSE- transient infection vs. contaminant .  Pharmacy has been consulted for Vancomycin dosing. Planned 7d DOT per ID.    Estimated Creatinine Clearance: 109 mL/min (by C-G formula based on SCr of 0.75 mg/dL).  Plan: Continue Vancomycin dose 750 mg IV Q8H (eAUC 492)- stop date continued form previous order  (04/02/2023) Monitor renal function   Allergies:  No Known Allergies  Filed Weights   03/25/23 1400  Weight: 77.1 kg (169 lb 15.6 oz)       Latest Ref Rng & Units 03/31/2023    6:12 AM 03/28/2023    1:11 PM 03/27/2023    2:04 PM  CBC  WBC 4.0 - 10.5 K/uL 5.3  5.0  9.9   Hemoglobin 12.0 - 15.0 g/dL 8.8  8.9  9.7   Hematocrit 36.0 - 46.0 % 25.7  25.8  27.5   Platelets 150 - 400 K/uL 90  79  72     Antibiotics Given (last 72 hours)     Date/Time Action Medication Dose Rate   03/29/23 0741 New Bag/Given   vancomycin (VANCOCIN) IVPB 1000 mg/200 mL premix 1,000 mg 200 mL/hr   03/29/23 1636 New Bag/Given   vancomycin (VANCOCIN) IVPB 1000 mg/200 mL premix 1,000 mg 200 mL/hr   03/29/23 2316 New Bag/Given   vancomycin (VANCOCIN) 750 mg in sodium chloride 0.9 % 250 mL IVPB 750 mg 265 mL/hr   03/30/23 1110 New Bag/Given   vancomycin (VANCOCIN) 750 mg in sodium chloride 0.9 % 250 mL IVPB 750 mg 250 mL/hr   03/30/23 1650 New Bag/Given   vancomycin (VANCOCIN) 750 mg in sodium chloride 0.9 % 250 mL IVPB 750 mg 250 mL/hr   03/31/23 0037 New Bag/Given   vancomycin (VANCOCIN) 750 mg in sodium chloride 0.9 % 250 mL IVPB 750 mg 250 mL/hr   03/31/23 1022 New Bag/Given   vancomycin (VANCOCIN) 750 mg in sodium chloride 0.9 % 250 mL IVPB 750 mg 250 mL/hr   03/31/23 1656 New Bag/Given   vancomycin (VANCOCIN) 750 mg in sodium chloride 0.9 % 250 mL IVPB 750 mg 250 mL/hr   03/31/23 2301 New Bag/Given   vancomycin (VANCOCIN) 750 mg in sodium chloride 0.9 % 250 mL IVPB 750 mg 250 mL/hr        CTX x1 12/23 for dirty UA, 12/25 >> 12/28 CTX 2gm 1/22>>1/23 Flagyl IV 1/22 >>1/24 Vanc 1/23 >>   12/23 blood: negative 12/24 MRSA PCR: negative 12/24 Urine: 50K Ecoli (R amp/unasyn) 12/24 T pallidium - nonreactive 1/22 blood: GPC in clusters - MRSE on BCID > Staph epi - pending 1/23 BCID: MRSE in 2 diff BC--added Vancomycin 1/24 blood: ngtd3  Thank you for allowing pharmacy to be a part of this patient's care.   Greta Doom BS, PharmD, BCPS Clinical Pharmacist 04/01/2023 7:04 AM  Contact: [phone] after 3 PM  "Be curious, not judgmental..." -Debbora Dus

## 2023-04-01 NOTE — Progress Notes (Signed)
PROGRESS NOTE   Subjective/Complaints: No new complaints this morning Discussed prognosis for recovery Family asks about e-stim, asked Hope to try for her  ROS: Limited by aphasia, +right sided weakness   Objective:   No results found.  Recent Labs    03/31/23 0612  WBC 5.3  HGB 8.8*  HCT 25.7*  PLT 90*   Recent Labs    03/30/23 0635 03/31/23 0612  NA 138 136  K 4.0 3.6  CL 109 107  CO2 19* 18*  GLUCOSE 127* 90  BUN 15 12  CREATININE 0.93 0.75  CALCIUM 8.6* 8.6*   No intake or output data in the 24 hours ending 04/01/23 1016       Physical Exam: Vital Signs Blood pressure (!) 98/57, pulse 97, temperature 99.4 F (37.4 C), temperature source Oral, resp. rate 16, height 5\' 4"  (1.626 m), weight 77.1 kg, SpO2 99%, unknown if currently breastfeeding.   General: NAD, sitting in w/c working with OT.  HEENT: Head is normocephalic, atraumatic, mucous membranes moist Neck: Supple without JVD or lymphadenopathy Heart: Reg rate and rhythm. No murmurs rubs or gallops Chest: CTA bilaterally without wheezes, rales, or rhonchi; no distress Abdomen: Positive bowel sounds, abdomen is soft, no significant tenderness, G tube c/d/i.  Urostomy tube in place. Extremities: No clubbing, cyanosis, or edema. Pulses are 2+ Psych: Appears frustrated at times when trying to speak Skin: Clean and intact without signs of breakdown over exposed surfaces, Gtube C/D/I Neuro: expressive aphasia MsK: RUE/RLE flaccid, moves LUE/LLE antigravity Musculoskeletal:     Cervical back: Normal range of motion and neck supple.     Comments: LUE/LLE moving well again gravity RUE- no voluntary movement seen of RUE or RLE   Neurological:     Comments: Patient came back from working with therapy.  Awake and alert.  She does make eye contact with examiner.  Increased verbal output today. MAS of 3-4 in R shoulder esp External rotation; MAS of 4 in  R elbow- lacking 45 degrees of extension with a lot of work; MAS of 2-3 of R hand- in fist at rest, but could completely open fingers and get past wrist neutral.  Difficult to assess sensation, stable 04/01/23      Assessment/Plan: 1. Functional deficits which require 3+ hours per day of interdisciplinary therapy in a comprehensive inpatient rehab setting. Physiatrist is providing close team supervision and 24 hour management of active medical problems listed below. Physiatrist and rehab team continue to assess barriers to discharge/monitor patient progress toward functional and medical goals  Care Tool:  Bathing    Body parts bathed by patient: Right arm, Chest, Abdomen, Front perineal area   Body parts bathed by helper: Left arm, Buttocks, Right upper leg, Left upper leg, Right lower leg, Left lower leg, Face     Bathing assist Assist Level: Maximal Assistance - Patient 24 - 49%     Upper Body Dressing/Undressing Upper body dressing   What is the patient wearing?: Pull over shirt    Upper body assist Assist Level: Maximal Assistance - Patient 25 - 49%    Lower Body Dressing/Undressing Lower body dressing      What is  the patient wearing?: Incontinence brief, Pants     Lower body assist Assist for lower body dressing: Total Assistance - Patient < 25%     Toileting Toileting    Toileting assist Assist for toileting: 2 Helpers     Transfers Chair/bed transfer  Transfers assist  Chair/bed transfer activity did not occur: Safety/medical concerns (unsafe to get up)  Chair/bed transfer assist level: 2 Helpers     Locomotion Ambulation   Ambulation assist      Assist level: 2 helpers Assistive device: Other (comment) (3 muskateer assist + chair follow) Max distance: 30'   Walk 10 feet activity   Assist     Assist level: 2 helpers Assistive device: Other (comment) (Rail)   Walk 50 feet activity   Assist Walk 50 feet with 2 turns activity did not  occur: Safety/medical concerns         Walk 150 feet activity   Assist Walk 150 feet activity did not occur: Safety/medical concerns         Walk 10 feet on uneven surface  activity   Assist Walk 10 feet on uneven surfaces activity did not occur: Safety/medical concerns         Wheelchair     Assist Is the patient using a wheelchair?: Yes Type of Wheelchair: Manual Wheelchair activity did not occur: Safety/medical concerns         Wheelchair 50 feet with 2 turns activity    Assist    Wheelchair 50 feet with 2 turns activity did not occur: Safety/medical concerns       Wheelchair 150 feet activity     Assist  Wheelchair 150 feet activity did not occur: Safety/medical concerns       Blood pressure (!) 98/57, pulse 97, temperature 99.4 F (37.4 C), temperature source Oral, resp. rate 16, height 5\' 4"  (1.626 m), weight 77.1 kg, SpO2 99%, unknown if currently breastfeeding.   Medical Problem List and Plan: 1. 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  Antiphospholipid antibody syndrome: discussed that Eliquis 5mg  BID will help to reduce risk of clotting/strokes  3. Pain Management: Neurontin 100 mg 3 times daily, Topamax 25 mg twice daily, oxycodone 5 mg every 4 hours as needed -03/30/23 some tingling in R arm, positional? Manageable with meds. OT working on releasing scapula and helping with positioning; monitor 4. Mood/Behavior/Sleep: Klonopin 0.5 mg nightly, melatonin 5 mg nightly as needed -antipsychotic agents: suggest Depakote or increase in gabapentin to help calm pt down;  5. Neuropsych/cognition: This patient is not capable of making decisions on her own behalf. 6. Skin/Wound Care: routine skin care for  PEG and overall skin 7. Fluids/Electrolytes/Nutrition: will assess labs, monitor I&Os, cont supplements -03/29/23 K a little low 3.4, see #18; Na improved. BMP otherwise fairly unremarkable.  -03/30/23 K 4.0 today, BMP otherwise ok today, monitor 8.  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  Followed by oncology/hematology   9.  Anemia/thrombocytopenia/iron deficiency/folic acid deficiency/B12 deficiency.  Follow-up CBC.  Continue Niferex 150mg  daily,  and supplements -1/24 hemoglobin a little lower again at 8.9, platelets overall stable at 79.  Not sure if some of hemoglobin decrease is delutional. Check occult blood-- not yet done 03/29/23   10.  Dysphagia.  Status post gastrostomy tube 03/21/2023 per interventional radiology.  Diet has been advanced to regular.  D/c TF. Discussed G-tube can be removed after 6-8 weeks from placement date  11.  E. coli UTI.  Antibiotic therapy completed 12.  Tobacco/alcohol use.  Counseling 13. Severe RUE spasticity- would strongly suggest Botox of RUE soon as well as resting hand splint for R wrist and R PRAFO- will order.   14. Abd pain- from PEG vs constipation- if no BM, might need KUB.  -IR consult to assess Gtube site.  Pus noted at skin site. Appears CT scan is ordered -Likely G-tube infection.  She is going for a IR procedure today.  Hospitalist team following-appreciate assistance.  She has been started on vancomycin and ceftriaxone for abdominal infection.  Echocardiogram and cultures have been ordered.  Spoke with hospitalist today, ID has been consulted. -1/24 ID recommending vancomycin for 7 days total and can use linezolid if discharged prior to this -03/29/23 appreciate hospitalist help, repeat BCx neg so far, ID signed off; looks like abd xray ordered, pending. Advancing diet as above.  -03/30/23 no  abd pain today, other than menstrual cramping, has tylenol if needed; abd xray yesterday fine. Hospitalist still following, no note yet today. Appreciate their assistance.    15.  Elevated transaminases-mild  Kpad ordered to minimize tylenol use  16.  Hyponatremia: resolved, monitor Na weekly.   17.  History of ileus: decrease feeding supplement to daily  18. Hypokalemia: klor 40 ordered 1/27  19. Right sided wrist drop: wrist cock-up splint ordered   20. Right sided ankle instability: aircast ordered  21. Anemia: repeat CBC ordered for tomorrow, stool occult ordered      LOS: 7 days A FACE TO FACE EVALUATION WAS PERFORMED  Clint Bolder P Shelbylynn Walczyk 04/01/2023, 10:16 AM

## 2023-04-01 NOTE — Progress Notes (Signed)
Physical Therapy Note  Patient Details  Name: Jacqueline [name] MRN: [id number] Date of Birth: 27-Jul-2000 Today's Date: 04/01/2023   Therapist reattempts missed session at later time, pt with improved alertness however indicating increased pain. Therapist communicates with RN for pain medicine and attempts to engage pt participation with therapy including OOB and supine exercises with pt continuing to shake head no in refusal.   Edwin Cap PT, DPT 04/01/2023, 3:00 PM

## 2023-04-01 NOTE — Progress Notes (Shared)
Occupational Therapy Weekly Progress Note  Patient Details  Name: Jacqueline [name] MRN: [id number] Date of Birth: 2001/02/17  Beginning of progress report period: March 26, 2023 End of progress report period: April 02, 2023   Patient has met 4 of 4 short term goals. Pt is making steady progress towards LTGs. She is able to bathe at an overall mod A level, dress with mod/max A and requires mod assist for toileting tasks. Pt continues to demonstrate high pain levels in RUE/PEG site, communication barriers with severe expressive aphasia, intermittent agitation with challenging situations/tasks, dynamic sitting/standing balance deficits, and Rt hemiplegia with RUE spasticity resulting in difficulty completing BADL tasks without increased physical assist. Pt will benefit from continued skilled OT services to focus on mentioned deficits. Family ed not initiated as of date and will be needed in prep for DC.   Patient continues to demonstrate the following deficits: muscle weakness, decreased cardiorespiratoy endurance, abnormal tone, decreased coordination, and decreased motor planning, decreased attention, decreased awareness, and decreased problem solving, and decreased sitting balance, decreased standing balance, and hemiplegia and therefore will continue to benefit from skilled OT intervention to enhance overall performance with BADL and Reduce care partner burden.  Patient progressing toward long term goals..  Continue plan of care.  OT Short Term Goals Week 1:  OT Short Term Goal 1 (Week 1): Pt will sit EOB for ADLs for > 3 mins with mod A for sitting balance OT Short Term Goal 1 - Progress (Week 1): Met OT Short Term Goal 2 (Week 1): Pt will complete functional toilet transfer with max A using LRAD OT Short Term Goal 2 - Progress (Week 1): Met OT Short Term Goal 3 (Week 1): Pt with attend to RUE during ADL task with min verbal cues during functional task OT Short Term Goal 3 - Progress  (Week 1): Met OT Short Term Goal 4 (Week 1): Pt will donn LB clothing with max A using AE PRN OT Short Term Goal 4 - Progress (Week 1): Met Week 2:  OT Short Term Goal 1 (Week 2): Pt will keep RUE in safe space during functional mobility with min questioning cues OT Short Term Goal 2 (Week 2): Pt will complete 2/3 toileting steps with Min A for standing balance OT Short Term Goal 3 (Week 2): Pt will donn UB clothing with mod A using hemi techniques OT Short Term Goal 4 (Week 2): Pt will donn RUE resting hand splint with min cues    Ajax Schroll E Jatasia Gundrum, MS, OTR/L  04/01/2023, 3:29 PM

## 2023-04-01 NOTE — Progress Notes (Signed)
Speech Language Pathology Daily Session Note  Patient Details  Name: Jacqueline [name] MRN: [id number] Date of Birth: 2000/03/06  Today's Date: 04/01/2023 SLP Individual Time: 0917-1016 SLP Individual Time Calculation (min): 59 min  Short Term Goals: Week 1: SLP Short Term Goal 1 (Week 1): Patient will complete automatic speech task with 70% acc given max multimodal cues SLP Short Term Goal 2 (Week 1): Patient will follow one step directions with 50% acc given mod multimodal cues SLP Short Term Goal 3 (Week 1): Pt will verbalize basic biographical/ personal information with 50% acc and max multimodal  A SLP Short Term Goal 4 (Week 1): Patient will answer simple to complex yes/ no questions with 70% acc given mod multimodal cues SLP Short Term Goal 5 (Week 1): Patient will consume regular solids with adequate bolus formation and no s/sx asp in >90% opportunities with sup A  Skilled Therapeutic Interventions:   Patient was seen in am to address language. Pt was alert and seen at bedside. She was agreeable to session. [name] on Facetime at beginning of session and he gave a brief account of pt's current language ability. SLP initiated identification of personal information. Given binary options presented visually pt identified personal information with 70% acc with greater difficulty identifying her age and birth year requiring frequent review of info due to limited carryover. SLP further addressed orientation with pt identifying temporal concepts presented visually with <10% acc indep improving to 100% with min A. SLP further introduced high tech AAC system. Pt verbalized agreeement with trialing [employer] app on her phone however, SLP unable to complete download due to pt inability to recall her password. SLP to continue to f/u on communication system options. SLP further attempted to address verbalization of biographical information with pt requiring max A for <10% acc. Pt with increased  frustration throughout task requiring frequent breaks. SLP also addressed automatic speech through challenging pt to produce ABCs and count to 10. Pt required max A with noted neologisms throughout task with no approximations noted.Spontaneous language observed during session included: " I don't know." "Awww" "you forgot"  various curse words in moments of increased frustration and some approximations of words and phrases.  At conclusion of session, pt requested SLP to warm up food at bedside. Pt with chick fil a nuggets and fries. Pt consumed nuggets with appeared adequate formation, transit, and deglutiton without s/s asp. SLP left a list of things for family to work on on [place] borad in room.  Pt left upright in bed with call button within reach and bed alarm active. SLP to continue POC.  Pain Pain Assessment Pain Scale: 0-10 Faces Pain Scale: Hurts little more Pain Type: Acute pain  Therapy/Group: Individual Therapy  Renaee Munda 04/01/2023, 2:35 PM

## 2023-04-01 NOTE — Progress Notes (Signed)
Physical Therapy Session Note  Patient Details  Name: Jacqueline [name] MRN: [id number] Date of Birth: [birth year]  Today's Date: 04/01/2023 PT Missed Time: 75 Minutes Missed Time Reason: Patient fatigue;Patient unwilling to participate  Pt presents in bed with lights off, sleeping, therapist attempts to awaken for therapy session with pt able to open eyes briefly, nod head "yes" to answer question of who her primary PT was, and then does not respond and remains with eyes closed with no response despite attempts to engage with therapy session. Pt missing 75 minutes of 75 minute session due to fatigue. Therapist will reattempt as schedule allows.  Edwin Cap PT, DPT 04/01/2023, 2:53 PM

## 2023-04-02 LAB — CULTURE, BLOOD (ROUTINE X 2)
Culture: NO GROWTH
Culture: NO GROWTH

## 2023-04-02 LAB — URINALYSIS, ROUTINE W REFLEX MICROSCOPIC
Bacteria, UA: NONE SEEN
Bilirubin Urine: NEGATIVE
Glucose, UA: NEGATIVE mg/dL
Ketones, ur: NEGATIVE mg/dL
Leukocytes,Ua: NEGATIVE
Nitrite: NEGATIVE
Protein, ur: >=300 mg/dL — AB
RBC / HPF: >50 RBC/hpf (ref 0–5)
Specific Gravity, Urine: 1.018 (ref 1.005–1.030)
pH: 7 (ref 5.0–8.0)

## 2023-04-02 LAB — OCCULT BLOOD X 1 CARD TO LAB, STOOL: Fecal Occult Bld: POSITIVE — AB

## 2023-04-02 MED ORDER — ADULT MULTIVITAMIN W/MINERALS CH
1.0000 | ORAL_TABLET | Freq: Every day | ORAL | Status: DC
  Administered 2023-04-02 – 2023-04-23 (×22): 1 via ORAL
  Filled 2023-04-02 (×22): qty 1

## 2023-04-02 MED ORDER — APIXABAN 5 MG PO TABS
5.0000 mg | ORAL_TABLET | Freq: Two times a day (BID) | ORAL | Status: DC
  Administered 2023-04-02 – 2023-04-05 (×7): 5 mg via ORAL
  Filled 2023-04-02 (×7): qty 1

## 2023-04-02 MED ORDER — POLYETHYLENE GLYCOL 3350 17 G PO PACK
17.0000 g | PACK | Freq: Two times a day (BID) | ORAL | Status: DC
  Administered 2023-04-02 – 2023-04-16 (×14): 17 g via ORAL
  Filled 2023-04-02 (×37): qty 1

## 2023-04-02 MED ORDER — HYDROXYZINE HCL 25 MG PO TABS
25.0000 mg | ORAL_TABLET | Freq: Three times a day (TID) | ORAL | Status: DC | PRN
  Administered 2023-04-08 – 2023-04-17 (×5): 25 mg via ORAL
  Filled 2023-04-02 (×5): qty 1

## 2023-04-02 MED ORDER — DOCUSATE SODIUM 100 MG PO CAPS
100.0000 mg | ORAL_CAPSULE | Freq: Two times a day (BID) | ORAL | Status: DC
  Administered 2023-04-02 – 2023-04-16 (×28): 100 mg via ORAL
  Filled 2023-04-02 (×29): qty 1

## 2023-04-02 MED ORDER — TOPIRAMATE 25 MG PO TABS
25.0000 mg | ORAL_TABLET | Freq: Two times a day (BID) | ORAL | Status: DC
  Administered 2023-04-02 – 2023-04-07 (×11): 25 mg via ORAL
  Filled 2023-04-02 (×11): qty 1

## 2023-04-02 MED ORDER — SIMETHICONE 40 MG/0.6ML PO SUSP
40.0000 mg | Freq: Four times a day (QID) | ORAL | Status: DC
Start: 2023-04-02 — End: 2023-04-23
  Administered 2023-04-02 – 2023-04-17 (×19): 40 mg via ORAL
  Filled 2023-04-02 (×86): qty 0.6

## 2023-04-02 MED ORDER — POLYSACCHARIDE IRON COMPLEX 150 MG PO CAPS
150.0000 mg | ORAL_CAPSULE | Freq: Every day | ORAL | Status: DC
Start: 2023-04-02 — End: 2023-04-23
  Administered 2023-04-02 – 2023-04-23 (×22): 150 mg via ORAL
  Filled 2023-04-02 (×22): qty 1

## 2023-04-02 MED ORDER — OXYCODONE HCL 5 MG PO TABS
5.0000 mg | ORAL_TABLET | ORAL | Status: DC | PRN
  Administered 2023-04-02 – 2023-04-23 (×64): 5 mg via ORAL
  Filled 2023-04-02 (×67): qty 1

## 2023-04-02 MED ORDER — MELATONIN 5 MG PO TABS
5.0000 mg | ORAL_TABLET | Freq: Every evening | ORAL | Status: DC | PRN
  Administered 2023-04-03 – 2023-04-22 (×11): 5 mg via ORAL
  Filled 2023-04-02 (×11): qty 1

## 2023-04-02 NOTE — Progress Notes (Signed)
Occupational Therapy Weekly Progress Note  Patient Details  Name: Jacqueline [name] MRN: [id number] Date of Birth: 01-13-2001  Beginning of progress report period: March 26, 2023 End of progress report period: April 02, 2023     Patient has met 4 of 4 short term goals. Pt is making steady progress towards LTGs. She is able to bathe at an overall mod A level, dress with mod/max A and requires mod assist for toileting tasks. Pt continues to demonstrate high pain levels in RUE/PEG site, communication barriers with severe expressive aphasia, intermittent agitation with challenging situations/tasks, dynamic sitting/standing balance deficits, and Rt hemiplegia with RUE spasticity resulting in difficulty completing BADL tasks without increased physical assist. Pt will benefit from continued skilled OT services to focus on mentioned deficits. Family ed not initiated as of date and will be needed in prep for DC.     Patient continues to demonstrate the following deficits: muscle weakness, decreased cardiorespiratoy endurance, abnormal tone, decreased coordination, and decreased motor planning, decreased attention, decreased awareness, and decreased problem solving, and decreased sitting balance, decreased standing balance, and hemiplegia and therefore will continue to benefit from skilled OT intervention to enhance overall performance with BADL and Reduce care partner burden.   Patient progressing toward long term goals..  Continue plan of care.   OT Short Term Goals Week 1:  OT Short Term Goal 1 (Week 1): Pt will sit EOB for ADLs for > 3 mins with mod A for sitting balance OT Short Term Goal 1 - Progress (Week 1): Met OT Short Term Goal 2 (Week 1): Pt will complete functional toilet transfer with max A using LRAD OT Short Term Goal 2 - Progress (Week 1): Met OT Short Term Goal 3 (Week 1): Pt with attend to RUE during ADL task with min verbal cues during functional task OT Short Term Goal 3 -  Progress (Week 1): Met OT Short Term Goal 4 (Week 1): Pt will donn LB clothing with max A using AE PRN OT Short Term Goal 4 - Progress (Week 1): Met Week 2:  OT Short Term Goal 1 (Week 2): Pt will keep RUE in safe space during functional mobility with min questioning cues OT Short Term Goal 2 (Week 2): Pt will complete 2/3 toileting steps with Min A for standing balance OT Short Term Goal 3 (Week 2): Pt will donn UB clothing with mod A using hemi techniques OT Short Term Goal 4 (Week 2): Pt will donn RUE resting hand splint with min cues       Laray Corbit E Christipher Rieger, MS, OTR/L  04/02/2023, 7:56 AM

## 2023-04-02 NOTE — Progress Notes (Signed)
PROGRESS NOTE   Subjective/Complaints: She wants to go home, says her mom will care for her, discussed that we will have team meeting at 11 today to discuss her progress  ROS: Limited by aphasia, +right sided weakness, improved appetite   Objective:   No results found.  Recent Labs    03/31/23 0612  WBC 5.3  HGB 8.8*  HCT 25.7*  PLT 90*   Recent Labs    03/31/23 0612  NA 136  K 3.6  CL 107  CO2 18*  GLUCOSE 90  BUN 12  CREATININE 0.75  CALCIUM 8.6*    Intake/Output Summary (Last 24 hours) at 04/02/2023 0932 Last data filed at 04/01/2023 1735 Gross per 24 hour  Intake 118 ml  Output --  Net 118 ml         Physical Exam: Vital Signs Blood pressure (!) 94/57, pulse 78, temperature 98.1 F (36.7 C), resp. rate 16, height 5\' 4"  (1.626 m), weight 77.1 kg, SpO2 100%, unknown if currently breastfeeding.   General: NAD, sitting in w/c working with OT.  HEENT: Head is normocephalic, atraumatic, mucous membranes moist Neck: Supple without JVD or lymphadenopathy Heart: Reg rate and rhythm. No murmurs rubs or gallops Chest: CTA bilaterally without wheezes, rales, or rhonchi; no distress Abdomen: Positive bowel sounds, abdomen is soft, no significant tenderness, G tube c/d/i.  Urostomy tube in place. Extremities: No clubbing, cyanosis, or edema. Pulses are 2+ Psych: Appears frustrated at times when trying to speak Skin: Clean and intact without signs of breakdown over exposed surfaces, Gtube C/D/I Neuro: expressive aphasia, improving MsK: RUE/RLE flaccid, moves LUE/LLE antigravity Musculoskeletal:     Cervical back: Normal range of motion and neck supple.     Comments: LUE/LLE moving well again gravity RUE- no voluntary movement seen of RUE or RLE   Neurological:     Comments: Patient came back from working with therapy.  Awake and alert.  She does make eye contact with examiner.  Increased verbal output  today. MAS of 3-4 in R shoulder esp External rotation; MAS of 4 in R elbow- lacking 45 degrees of extension with a lot of work; MAS of 2-3 of R hand- in fist at rest, but could completely open fingers and get past wrist neutral.  Difficult to assess sensation, stable 04/01/23      Assessment/Plan: 1. Functional deficits which require 3+ hours per day of interdisciplinary therapy in a comprehensive inpatient rehab setting. Physiatrist is providing close team supervision and 24 hour management of active medical problems listed below. Physiatrist and rehab team continue to assess barriers to discharge/monitor patient progress toward functional and medical goals  Care Tool:  Bathing    Body parts bathed by patient: Right arm, Chest, Abdomen, Front perineal area   Body parts bathed by helper: Left arm, Buttocks, Right upper leg, Left upper leg, Right lower leg, Left lower leg, Face     Bathing assist Assist Level: Maximal Assistance - Patient 24 - 49%     Upper Body Dressing/Undressing Upper body dressing   What is the patient wearing?: Pull over shirt    Upper body assist Assist Level: Maximal Assistance - Patient 25 -  49%    Lower Body Dressing/Undressing Lower body dressing      What is the patient wearing?: Incontinence brief, Pants     Lower body assist Assist for lower body dressing: Total Assistance - Patient < 25%     Toileting Toileting    Toileting assist Assist for toileting: 2 Helpers     Transfers Chair/bed transfer  Transfers assist  Chair/bed transfer activity did not occur: Safety/medical concerns (unsafe to get up)  Chair/bed transfer assist level: 2 Helpers     Locomotion Ambulation   Ambulation assist      Assist level: 2 helpers Assistive device: Other (comment) (3 muskateer assist + chair follow) Max distance: 30'   Walk 10 feet activity   Assist     Assist level: 2 helpers Assistive device: Other (comment) (Rail)   Walk 50  feet activity   Assist Walk 50 feet with 2 turns activity did not occur: Safety/medical concerns         Walk 150 feet activity   Assist Walk 150 feet activity did not occur: Safety/medical concerns         Walk 10 feet on uneven surface  activity   Assist Walk 10 feet on uneven surfaces activity did not occur: Safety/medical concerns         Wheelchair     Assist Is the patient using a wheelchair?: Yes Type of Wheelchair: Manual Wheelchair activity did not occur: Safety/medical concerns         Wheelchair 50 feet with 2 turns activity    Assist    Wheelchair 50 feet with 2 turns activity did not occur: Safety/medical concerns       Wheelchair 150 feet activity     Assist  Wheelchair 150 feet activity did not occur: Safety/medical concerns       Blood pressure (!) 94/57, pulse 78, temperature 98.1 F (36.7 C), resp. rate 16, height 5\' 4"  (1.626 m), weight 77.1 kg, SpO2 100%, unknown if currently breastfeeding.   Medical Problem List and Plan: 1. 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  Antiphospholipid antibody syndrome: discussed that Eliquis 5mg  BID will help to reduce risk of clotting/strokes  3. Pain Management: Neurontin 100 mg 3 times daily, Topamax 25 mg twice daily, oxycodone 5 mg every 4 hours as needed -03/30/23 some tingling in R arm, positional? Manageable with meds. OT working on releasing scapula and helping with positioning; monitor 4. Mood/Behavior/Sleep: Klonopin 0.5 mg nightly, melatonin 5 mg nightly as needed -antipsychotic agents: suggest Depakote or increase in gabapentin to help calm pt down;  5. Neuropsych/cognition: This patient is not  capable of making decisions on her own behalf. 6. Skin/Wound Care: routine skin care for PEG and overall skin 7. Fluids/Electrolytes/Nutrition: will assess labs, monitor I&Os, cont supplements -03/29/23 K a little low 3.4, see #18; Na improved. BMP otherwise fairly unremarkable.  -03/30/23 K 4.0 today, BMP otherwise ok today, monitor 8.  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  Followed by oncology/hematology   9.  Anemia/thrombocytopenia/iron deficiency/folic acid deficiency/B12 deficiency.  Follow-up CBC.  Continue Niferex 150mg  daily, and supplements -1/24 hemoglobin a little lower again at 8.9, platelets overall stable at 79.  Not sure if some of hemoglobin decrease is delutional. Check occult blood-- not yet done 03/29/23   10.  Dysphagia.  Status post gastrostomy tube 03/21/2023 per interventional radiology.  Diet has been advanced to regular.  D/c TF. Discussed G-tube can be removed after 6-8 weeks from placement date  11.  E. coli UTI.  Antibiotic therapy completed 12.  Tobacco/alcohol use.  Counseling 13. Severe RUE spasticity- would strongly suggest Botox of RUE soon as well as resting hand splint for R wrist and R PRAFO- will order.   14. Abd pain- from PEG vs constipation- if no BM, might need KUB.  -IR consult to assess Gtube site.  Pus noted at skin site. Appears CT scan is ordered -Likely G-tube infection.  She is going for a IR procedure today.  Hospitalist team following-appreciate assistance.  She has been started on vancomycin and ceftriaxone for abdominal infection.  Echocardiogram and cultures have been ordered.  Spoke with hospitalist today, ID has been consulted. -1/24 ID recommending vancomycin for 7 days total and can use linezolid if discharged prior to this -03/29/23 appreciate hospitalist help, repeat BCx neg so far,  ID signed off; looks like abd xray ordered, pending. Advancing diet as above.  -03/30/23 no abd pain today, other than menstrual cramping, has tylenol if needed; abd xray yesterday fine. Hospitalist still following, no note yet today. Appreciate their assistance.    15.  Elevated transaminases-mild  Kpad ordered to minimize tylenol use  16.  Hyponatremia: resolved, monitor Na weekly.   17.  History of ileus: decrease feeding supplement to daily  18. Hypokalemia: klor 40 ordered 1/27, monitor potassium weekly  19. Right sided wrist drop: wrist cock-up splint ordered   20. Right sided ankle instability: aircast ordered, discussed with PT that it has been received  21. Anemia: repeat CBC ordered for tomorrow, stool occult ordered, CBC for today has not yet been drawn, stool has not yet been sent, messaged nurse      LOS: 8 days A FACE TO FACE EVALUATION WAS PERFORMED  Drema Pry Kinesha Auten 04/02/2023, 9:37 AM

## 2023-04-02 NOTE — Progress Notes (Signed)
Physical Therapy Session Note  Patient Details  Name: Jacqueline [name] MRN: [id number] Date of Birth: 2000/09/25  Today's Date: 04/02/2023 PT Missed Time: 30 Minutes Missed Time Reason: Patient fatigue;Patient unwilling to participate  Pt presents in room in bed, asleep with RUE falling out of bed and pt poorly positioned, therapist assist pt to ensure safety with pillows placed on R side. Therapist attempt awaken pt and encouraged to participate with therapy with pt keeping eyes closed and verbalizing "no." Pt missing 30 minutes out of 30 minute session.  Edwin Cap PT, DPT 04/02/2023, 1:14 PM

## 2023-04-02 NOTE — Progress Notes (Signed)
Physical Therapy Weekly Progress Note  Patient Details  Name: Jacqueline [name] MRN: [id number] Date of Birth: Oct 14, 2000  Beginning of progress report period: March 26, 2023 End of progress report period: April 02, 2023  Patient has met 2 of 3 short term goals. Pt demonstrates slow progress towards towards long term goals. Pt is currently limited by frustration and agitation related to expressive aphasia with fluctuating levels of motivation to participate in therapy sessions. Pt is currently able to perform bed mobility with mod A (but can require up to max/+2 depending on motivation level). Pt is able to perform transfers with as little as mod A (but again can require up to +2 assist). Pt has ambulated up to 15ft with LUE around Rehab tech and therapist managing RUE/RLE but requires max A to advance, place, and control RLE. Noted pt to have hypertonia in RUE and RLE. Pt will require hands on family education training prior to discharge.  Patient continues to demonstrate the following deficits muscle weakness, decreased cardiorespiratoy endurance, impaired timing and sequencing, abnormal tone, unbalanced muscle activation, decreased coordination, and decreased motor planning, decreased awareness, decreased problem solving, and decreased safety awareness, and decreased sitting balance, decreased standing balance, decreased postural control, hemiplegia, and decreased balance strategies and therefore will continue to benefit from skilled PT intervention to increase functional independence with mobility.  Patient progressing toward long term goals..  Continue plan of care.  PT Short Term Goals Week 1:  PT Short Term Goal 1 (Week 1): pt will transfer supine<>sitting EOB with mod A of 1 PT Short Term Goal 1 - Progress (Week 1): Met PT Short Term Goal 2 (Week 1): pt will transfer sit<>stand with LRAD and mod A of 1 PT Short Term Goal 2 - Progress (Week 1): Met PT Short Term Goal 3 (Week 1): pt will  ambulate 21ft with LRAD and max A of 1 PT Short Term Goal 3 - Progress (Week 1): Progressing toward goal Week 2:  PT Short Term Goal 1 (Week 2): pt will perform all aspects of bed mobility with mod A consistantly PT Short Term Goal 2 (Week 2): pt will transfer bed<>chair with mod A of 1 consistantly PT Short Term Goal 3 (Week 2): pt will ambulate 74ft with LRAD and skilled max A of 1  Skilled Therapeutic Interventions/Progress Updates:  Ambulation/gait training;Discharge planning;Functional mobility training;Psychosocial support;Therapeutic Activities;Balance/vestibular training;Disease management/prevention;Neuromuscular re-education;Skin care/wound management;Therapeutic Exercise;Wheelchair propulsion/positioning;Cognitive remediation/compensation;DME/adaptive equipment instruction;Pain management;Splinting/orthotics;UE/LE Strength taining/ROM;Community reintegration;Functional electrical stimulation;Patient/family education;Stair training;UE/LE Coordination activities   Therapy Documentation Precautions:  Precautions Precautions: Fall Precaution Comments: PEG, expressive aphasia with agitation, Rt hemi Restrictions Weight Bearing Restrictions Per Provider Order: Yes  Therapy/Group: Individual Therapy Marlana Salvage Zaunegger Blima Rich PT, DPT 04/02/2023, 7:23 AM

## 2023-04-02 NOTE — Progress Notes (Signed)
Occupational Therapy Session Note  Patient Details  Name: Jacqueline [name] MRN: [id number] Date of Birth: 27-Dec-2000  Today's Date: 04/02/2023 OT Individual Time: 1415-1530 OT Individual Time Calculation (min): 75 min    Short Term Goals: Week 2:  OT Short Term Goal 1 (Week 2): Pt will keep RUE in safe space during functional mobility with min questioning cues OT Short Term Goal 2 (Week 2): Pt will complete 2/3 toileting steps with Min A for standing balance OT Short Term Goal 3 (Week 2): Pt will donn UB clothing with mod A using hemi techniques OT Short Term Goal 4 (Week 2): Pt will donn RUE resting hand splint with min cues  Skilled Therapeutic Interventions/Progress Updates:  Skilled OT intervention completed with focus on ADL retraining, functional transfers, R NMR. Pt received semi supine in bed with eyes closed. Pt kept eyes closed while OT tried arousing her. Pt initially ignored OT, shaking head no to therapy- yelling "leave" and "stop." However OT provided very firm education on rehab policy and also firm motivation to better herself for her kids and pt eventually came around to getting OOB for bathroom opportunity.   Pt remained expressively aphasic, but with improved ability to point to items needed and respond to yes/no questions with about 75% accuracy this date. Rt digit/wrist pain reported with PROM and intermittent pain at PEG site with positional changes; pre-medicated. OT offered rest breaks and repositioning throughout for pain reduction.  Transitioned to EOB with min A for RLE only with heavy reliance on bed rails. Able to sit statically with supervision during donning of grip socks dependently. Mod A squat pivot > Lt to w/c with cues for sequencing and body positioning. Transported dependently in w/c > BSC over toilet. Mod A sit > stand and max A stand pivot with rehab tech on standby but did not assist with transfer. +2 utilized to lower LB clothing. Pt immediately continent  of urinary void, with noted large clot in toilet as pt on her menses. Pt able to wipe with supervision for dynamic sitting balance. Offered pt disposable underwear or personal, however pt declined preferring brief. Threaded along with pad with total A. Stood mod A with Rt knee block, then +2 utilized for donning clothing over hips. Mod A stand pivot > w/c with rehab tech securing w/c and IV management only.  Transported dependently in w/c <> gym. Pt required mod A to doff Rt wrist cock up splint. OT completed gentle PROM to wrist, forearm and digits, with most pain in digits 2-3 with extension. Discussed self-ROM pt needs to complete multiple times a day to prevent contractures and pt receptive.   Mod A sit > stand at elevated table with Rt knee guard, then pt completed moderately complex pegboard design with min/mod A for standing balance. OT applied hand over Rt hand assist for promoting weight bearing on table, however pt unable to tolerate full weight due to digit pain/tension with extension. Pt able to remain in stance for activity, but required frequent cues for weight shifting onto RLE, Rt knee activation with extension and also for cognitive component of task with sequencing at mod A level.   Back in room, mod A squat pivot > EOB, Max A bed mobility for sit > supine. OT donned RUE resting hand splint for digit support at night. Pt remained semi upright in bed, with bed alarm on/activated, and with all needs in reach at end of session.   Therapy Documentation Precautions:  Precautions Precautions:  Fall Precaution Comments: PEG, expressive aphasia with agitation, Rt hemi Restrictions Weight Bearing Restrictions Per Provider Order: Yes    Therapy/Group: Individual Therapy  Melvyn Novas, MS, OTR/L  04/02/2023, 3:49 PM

## 2023-04-02 NOTE — Progress Notes (Signed)
Patient ID: Jacqueline [name], female   DOB: 23-Dec-2000, 23 y.o.   MRN: [id number]  Spoke with Mom via telephone to discuss team conference goals of min level and pt wanting to go home ASAP. MD wanted to see if Mom would come in and attend therapies to see if can manage at home and set discharge date. Mom feel she needs to be here on rehab and get the therapies she needs. She will not be better at home like pt believes she will be. Mom apologized for pt which is not necessary. She will talk with her daughter. Have scheduled for her to be here Monday from 9-12 when kids are in school to see pt in therapies. Will also speak with pt regarding her participation and have neuro-psych see

## 2023-04-02 NOTE — Progress Notes (Signed)
Physical Therapy Session Note  Patient Details  Name: Jacqueline [name] MRN: [id number] Date of Birth: September 30, 2000  Today's Date: 04/02/2023 PT Individual Time: 0916-1011 PT Individual Time Calculation (min): 55 min   Short Term Goals: Week 1:  PT Short Term Goal 1 (Week 1): pt will transfer supine<>sitting EOB with mod A of 1 PT Short Term Goal 2 (Week 1): pt will transfer sit<>stand with LRAD and mod A of 1 PT Short Term Goal 3 (Week 1): pt will ambulate 34ft with LRAD and max A of 1  Skilled Therapeutic Interventions/Progress Updates:   Received pt semi-reclined in bed. Pt appeared to have pain surrounding PEG tube and in L arm from IV - RN aware. Session with emphasis on functional mobility/transfers, dressing, generalized strengthening and endurance, dynamic standing balance/coordination, and gait training. Pt transferred semi-reclined<>sitting EOB with HOB elevated and use of bedrails with mod A for RLE management. Donned socks, shoes (using shoe horn with increased time), and pants sitting EOB with max A - MD arrived for morning rounds. Increased time spent donning shoes (pt needs size 7.5 - current shoes too small and cannot fit aircast). Stood from EOB with mod A (+2 providing min HHA) while guarding R knee - pt becoming frustrated with +2, requesting tech "hold her" - instructed pt to do as much for herself as she can since pt insisting on discharging home ASAP. Pt required max A to pull pants over hips and performed stand<>pivot into WC with min A +2 (pt insisting on rehab tech assisting and refusing to attempt on her own).   Pt transported to/from room in [facility] dependently for time management purposes. Donned shoe cover to RLE and stood from [facility] with LUE around rehab tech and therapist supporting RUE (min/mod A +2). Pt ambulated 73ft wit LUE around rehab tech and therapist assisting with advancing/placing RLE with min A to block R knee from buckling and hyperextending and cues to "kick" RLE and  "straighten knee". Pt required increased assist and mod multimodal cues when turning to back up and sit on mat (pt attempting to sit prematurely). Transferred mat<>WC via squat<>pivot with mod A and multimodal cues for sequencing. Returned to room and pt communicated need to void. Stood in [place] with min A and transferred to toilet with bedside commode over top dependently. Semi-stand<>stand<>sitting on commode with light min A with max A for clothing management. Pt able to void and handed off care to NT due to time restrictions.   Therapy Documentation Precautions:  Precautions Precautions: Fall Precaution Comments: PEG, expressive aphasia with agitation, Rt hemi Restrictions Weight Bearing Restrictions Per Provider Order: Yes  Therapy/Group: Individual Therapy Marlana Salvage Zaunegger Blima Rich PT, DPT 04/02/2023, 6:53 AM

## 2023-04-02 NOTE — Progress Notes (Signed)
Speech Language Pathology Weekly Progress and Session Note  Patient Details  Name: Jacqueline [name] MRN: [id number] Date of Birth: [birth year]  Beginning of progress report period: March 26, 2023 End of progress report period: April 02, 2023  Today's Date: 04/02/2023 SLP Individual Time: 4696-2952 SLP Individual Time Calculation (min): 44 min  Short Term Goals: Week 1: SLP Short Term Goal 1 (Week 1): Patient will complete automatic speech task with 70% acc given max multimodal cues SLP Short Term Goal 1 - Progress (Week 1): Not met SLP Short Term Goal 2 (Week 1): Patient will follow one step directions with 50% acc given mod multimodal cues SLP Short Term Goal 2 - Progress (Week 1): Not met SLP Short Term Goal 3 (Week 1): Pt will verbalize basic biographical/ personal information with 50% acc and max multimodal  A SLP Short Term Goal 3 - Progress (Week 1): Not met SLP Short Term Goal 4 (Week 1): Patient will answer simple yes/ no questions with 70% acc given mod multimodal cues SLP Short Term Goal 4 - Progress (Week 1): Not met SLP Short Term Goal 5 (Week 1): Patient will consume regular solids with adequate bolus formation and no s/sx asp in >90% opportunities with sup A SLP Short Term Goal 5 - Progress (Week 1): Met    New Short Term Goals: Week 2: SLP Short Term Goal 1 (Week 2): Patient will follow one step directions with 50% acc given max multimodal cues SLP Short Term Goal 2 (Week 2): Patient will answer simple yes/ no questions with 60% acc given max multimodal cues SLP Short Term Goal 3 (Week 2): Patient will repeat single words with 60% acc with min multimodal cues SLP Short Term Goal 4 (Week 2): Patient will complete automatic speech task with 50% acc given max multimodal cues  Weekly Progress Updates: Pt has made slow gains and has met 1 of 5 STG's this reporting period due to adequate tolerance of regular solids. Currently, pt continues to require mod to max A for  automatic speech, following directions, and answering yes/ no questions. Pt/ family education ongoing. Pt would benefit from continued skilled SLP intervention to maximize receptive and expressive language in order to maximize her functional independence prior to discharge.   Intensity: Minumum of 1-2 x/day, 30 to 90 minutes Frequency: 3 to 5 out of 7 days Duration/Length of Stay: 3-4 weeks Treatment/Interventions: Dysphagia/aspiration precaution training;Internal/external aids;Cognitive remediation/compensation;Speech/Language facilitation;Cueing hierarchy;Environmental controls;Therapeutic Activities;Multimodal communication approach;Patient/family education;Functional tasks   Daily Session  Skilled Therapeutic Interventions: Patient was seen in am to address expressive and receptive language. Pt was alert upon SLP arrival and talking with significant other on facetime. She was agreeable for session and ended phone call on her own volition. Pt was challenged to identify biographical and personal information given a FO2 presented visually. Pt identified information with 100% acc which is a considerable improvement from previous date. She also identified temporal concepts with improved awareness to year and continued difficulty identifying month. She answered simple yes/ no questions with 50% acc improving to 67% acc given max multimodal cues. She identified an object given a FO2 with 88% acc indep which did not improve with additional cues. SLP guided pt in automatic speech through counting to 10. Pt required max encouragement during expressive task as indicated by frequently stating, "I can't, I can't." Pt verbalized numbers 2, 4, 5, 6, and 7 during multiple trials and often times not in sequence with current number. Spontaneous language during this session consisted  of, "I have to ask, boy, because, right, hell no, my name, come on, and what." At conclusion of session, pt verbalized this clinicians  name while saying bye. She was left upright in bed with call button within reach and bed alarm active. SLP to continue POC.    General    Pain Pain Assessment Pain Scale: Faces Faces Pain Scale: Hurts a little bit Pain Type: Acute pain Pain Location: Arm Pain Orientation: Left  Therapy/Group: Individual Therapy  Renaee Munda 04/02/2023, 11:39 AM

## 2023-04-03 LAB — CBC WITH DIFFERENTIAL/PLATELET
Abs Immature Granulocytes: 0.02 10*3/uL (ref 0.00–0.07)
Basophils Absolute: 0 10*3/uL (ref 0.0–0.1)
Basophils Relative: 1 %
Eosinophils Absolute: 0.1 10*3/uL (ref 0.0–0.5)
Eosinophils Relative: 3 %
HCT: 23.9 % — ABNORMAL LOW (ref 36.0–46.0)
Hemoglobin: 8.3 g/dL — ABNORMAL LOW (ref 12.0–15.0)
Immature Granulocytes: 0 %
Lymphocytes Relative: 27 %
Lymphs Abs: 1.5 10*3/uL (ref 0.7–4.0)
MCH: 27.4 pg (ref 26.0–34.0)
MCHC: 34.7 g/dL (ref 30.0–36.0)
MCV: 78.9 fL — ABNORMAL LOW (ref 80.0–100.0)
Monocytes Absolute: 0.5 10*3/uL (ref 0.1–1.0)
Monocytes Relative: 9 %
Neutro Abs: 3.3 10*3/uL (ref 1.7–7.7)
Neutrophils Relative %: 60 %
Platelets: 72 10*3/uL — ABNORMAL LOW (ref 150–400)
RBC: 3.03 MIL/uL — ABNORMAL LOW (ref 3.87–5.11)
RDW: 21.2 % — ABNORMAL HIGH (ref 11.5–15.5)
Smear Review: NORMAL
WBC: 5.4 10*3/uL (ref 4.0–10.5)
nRBC: 0 % (ref 0.0–0.2)

## 2023-04-03 LAB — URINE CULTURE: Culture: <10000 — AB

## 2023-04-03 MED ORDER — BOOST / RESOURCE BREEZE PO LIQD CUSTOM
1.0000 | Freq: Two times a day (BID) | ORAL | Status: DC
  Administered 2023-04-03 – 2023-04-19 (×4): 1 via ORAL

## 2023-04-03 MED ORDER — BOOST / RESOURCE BREEZE PO LIQD CUSTOM: 1.0000 | Freq: Three times a day (TID) | ORAL | Status: DC

## 2023-04-03 NOTE — Patient Care Conference (Signed)
Inpatient RehabilitationTeam Conference and Plan of Care Update Date: 04/02/2023   Time: 1139 am    Patient Name: Jacqueline [name]      Medical Record Number: [id number]  Date of Birth: [birth year] Sex: Female         Room/Bed: 4W14C/4W14C-01 Payor Info: Payor: [place] MEDICAID PREPAID HEALTH PLAN / Plan: [place] MEDICAID [employer] / Product Type: *No Product type* /    Admit Date/Time:  03/25/2023  2:38 PM  Primary Diagnosis:  Left middle cerebral artery stroke [facility])  Hospital Problems: Principal Problem:   Left middle cerebral artery stroke (HCC) Active Problems:   Thrombocytopenia (HCC)   Iron deficiency anemia   Lupus anticoagulant disorder (HCC)   SIRS (systemic inflammatory response syndrome) (HCC)   Hyponatremia   Ileus (HCC)   Pain around PEG tube site, initial encounter   Transaminitis   History of pulmonary embolism   Bacteremia    Expected Discharge Date: Expected Discharge Date:  (pending)  Team Members Present: Physician leading conference: Dr. Sula Soda Social Worker Present: Dossie Der, LCSW Nurse Present: Chana Bode, RN;Heiley Shaikh Shirlee Latch, RN PT Present: Blima Rich, PT OT Present: Candee Furbish, OT SLP Present: Jeannie Done, SLP     Current Status/Progress Goal Weekly Team Focus  Bowel/Bladder   patient incontinent of b/b [facility] 1/27   will regain continence   time toileting q3-4 hours while awake    Swallow/Nutrition/ Hydration   tolerating regular solids and thin   Mod I  observe consumption of regular diet    ADL's   Mod A UB, Mod/max A LB, Mod A toileting   Min A   Barriers- easily frustrated with agitation with challenging tasks or communication barriers, perseveration, functional endurance, Rt hemi with spasticity in RUE, fam ed not initiated    Mobility   bed mobility mod A (if pt assisting, if not max/+2), transfers without AD mod A (+2 on standby), gait 73ft with L handrail and mod/max A +2 with assist for RLE management    CGA/supervision, min A steps  barriers: expressive aphasia, frustration/agitation, fatigue, fluctuating motvation levels, decreased standing balance/coordination, R hemiparesis, and pain    Communication   severe expressive language deficits- neologisms, phonemic paraphasias   mod A   communication system- high tech device, automatic speech, yes/ no questions    Safety/Cognition/ Behavioral Observations  DTA- oriented to self, not oriented to temporal concepts            Pain   patient c/o extreme pain in Peg site.   <3/10   assess whether Peg is still needed    Skin   PEG site incision   maintain infection free incision  assess skin qshift and PRN changing PEG dressing daily or PRN      Discharge Planning:  Mom to take her to her home and have sisters assist, will make PCS referral for additional assist.   Team Discussion: Patient was admitted post left mca stroke with antiphospholipid syndrome.Patient has severe expressive language deficits and perseveration and gets frustrated by the communication barrier and does not engage with treatments. Patient on target to meet rehab goals: no, patient currently needs min- mod A with upper body care and mod-max assist for lower body care with mod assist for toileting. Patient needs mod A- max assist using an adaptive device with transfers. Patient was able to ambulate 79' with +2 min- mod A. Goals for discharge are set for min assist overall.  *See Care Plan and progress notes for  long and short-term goals.   Revisions to Treatment Plan:  Air cast to right lower extremity Resting hand splint to right upper extremity Behavioral plan updated  Neuropsych consult E stim  Teaching Needs: Safety, medications, transferes, toileting , etc.  Current Barriers to Discharge: Decreased caregiver support, Neurogenic bowel and bladder, and Behavior  Possible Resolutions to Barriers: Family education  PCS referral Peg removal March  1,2025 outpatient    Medical Summary Current Status: right sided ankle rolling, right sided weakness, antiphospholipid antibody syndrome, PEG  Barriers to Discharge: Medical stability  Barriers to Discharge Comments: right sided ankle rolling, right sided weakness, antiphospholipid antibody syndrome, PEG Possible Resolutions to [employer] Focus: aircast ordered, wrist cockup ordered, continue Eliquis, discussed prognosis, discussed that PEG can be removed after 6-8 weeks   Continued Need for Acute Rehabilitation Level of Care: The patient requires daily medical management by a physician with specialized training in physical medicine and rehabilitation for the following reasons: Direction of a multidisciplinary physical rehabilitation program to maximize functional independence : Yes Medical management of patient stability for increased activity during participation in an intensive rehabilitation regime.: Yes Analysis of laboratory values and/or radiology reports with any subsequent need for medication adjustment and/or medical intervention. : Yes   I attest that I was present, lead the team conference, and concur with the assessment and plan of the team.   Gwenyth Allegra 04/03/2023, 9:14 AM

## 2023-04-03 NOTE — Progress Notes (Signed)
Speech Language Pathology Daily Session Note  Patient Details  Name: Jacqueline [name] MRN: [id number] Date of Birth: June 13, 2000  Today's Date: 04/03/2023 SLP Individual Time: 1448-1530 SLP Individual Time Calculation (min): 42 min  Short Term Goals: Week 2: SLP Short Term Goal 1 (Week 2): Patient will follow one step directions with 50% acc given max multimodal cues SLP Short Term Goal 2 (Week 2): Patient will answer simple yes/ no questions with 60% acc given max multimodal cues SLP Short Term Goal 3 (Week 2): Patient will repeat single words with 60% acc with min multimodal cues SLP Short Term Goal 4 (Week 2): Patient will complete automatic speech task with 50% acc given max multimodal cues  Skilled Therapeutic Interventions: Skilled therapy session focused on communication goals. SLP faciliated session by providing max-total a during automatic speech tasks. Patient able to verbalize numbers 4-5, however unable to verbalize numbers 1-3, alphabet and name. Patient continues to present with neologisms, perseverations and phonemic paraphasias and is inconsistently aware of errors. Patient began to demonstrate frustration with automatic speech tasks, so SLP began receptive language activities. Patient answered 13/15 basic yes/no questions correctly given minA and answered orientation questions with 90% accuracy given minA. Patient with spontaneous social language throughout session including "yes" "no" "I dont know" and various cuss words. At the end of the session, patient able to write name "Zippy" with maxA. Patient left in bed with alarm set and call bell in reach. Continue POC.     Pain None verbalized/visualized  Therapy/Group: Individual Therapy  Randi College M.A., CF-SLP 04/03/2023, 7:43 AM

## 2023-04-03 NOTE — Progress Notes (Signed)
Physical Therapy Session Note  Patient Details  Name: Jacqueline [name] MRN: [id number] Date of Birth: [birth year]  Today's Date: 04/03/2023 PT Individual Time: 4098-1191 PT Individual Time Calculation (min): 41 min   Short Term Goals: Week 1:  PT Short Term Goal 1 (Week 1): pt will transfer supine<>sitting EOB with mod A of 1 PT Short Term Goal 1 - Progress (Week 1): Met PT Short Term Goal 2 (Week 1): pt will transfer sit<>stand with LRAD and mod A of 1 PT Short Term Goal 2 - Progress (Week 1): Met PT Short Term Goal 3 (Week 1): pt will ambulate 75ft with LRAD and max A of 1 PT Short Term Goal 3 - Progress (Week 1): Progressing toward goal Week 2:  PT Short Term Goal 1 (Week 2): pt will perform all aspects of bed mobility with mod A consistantly PT Short Term Goal 2 (Week 2): pt will transfer bed<>chair with mod A of 1 consistantly PT Short Term Goal 3 (Week 2): pt will ambulate 55ft with LRAD and skilled max A of 1  Skilled Therapeutic Interventions/Progress Updates:   Received pt semi-reclined in bed, pt agreeable to PT treatment, and did not appear to be in any pain during session. Session with emphasis on functional mobility/transfers, generalized strengthening and endurance, dynamic standing balance/coordination, and gait training. Pt transferred semi-reclined<>sitting R EOB with HOB elevated and use of bedrails with light mod A for RLE management. Donned socks and shoes with total A using shoe horn with increased time due to swelling in R foot and shoe being too tight. Encouraged [name] to get size 7.5 shoe, but pt refusing shouting "no" over and over - anticipate that pt thinks when swelling goes down shoe will fit but pt doesn't understand that shoe is too tight for brace to fit and need to trial AFO.  Pt transferred bed<>WC via squat<>pivot to L with mod A with cues for hand placement/sequencing and transported to/from room in [facility] dependently for time management purposes. Donned shoe cover  to R foot and stood from [facility] with min A +2 with assist to place R foot and guard R knee. Pt ambulated 50ft with 3 musketeer assist [facility] from rehab tech) with mod A from therapist. Pt required assist to advance/place R foot and min blocking from knee buckling and hyperextension - appeared to have more quad control today vs yesterday. Provided cues to "kick" RLE but pt with poor carry over with cues. Pt continues to sit prematurely and requires max cues to back up completley prior to sitting and get hips completely into chair when transferring.   Returned to room and pt insisted on returning to bed - stand<>pivot WC<>bed to R with mod A. Removed shoes and transferred into supine with mod A for RLE management. Instructed pt to scoot to [facility] pulling with LUE on headboard and pushing with LLE - required max A using chuck pad. Bridged to remove pants (assist to maintain RLE in hooklying). Concluded session with pt semi-reclined in bed, needs within reach, and bed alarm on.   Therapy Documentation Precautions:  Precautions Precautions: Fall Precaution Comments: PEG, expressive aphasia with agitation, Rt hemi Restrictions Weight Bearing Restrictions Per Provider Order: Yes  Therapy/Group: Individual Therapy Marlana Salvage Zaunegger Blima Rich PT, DPT 04/03/2023, 6:52 AM

## 2023-04-03 NOTE — Progress Notes (Signed)
Physical Therapy Session Note  Patient Details  Name: Jacqueline [name] MRN: [id number] Date of Birth: 10/15/2000  Today's Date: 04/03/2023 PT Individual Time: 5784-6962 PT Individual Time Calculation (min): 41 min   Short Term Goals: Week 2:  PT Short Term Goal 1 (Week 2): pt will perform all aspects of bed mobility with mod A consistantly PT Short Term Goal 2 (Week 2): pt will transfer bed<>chair with mod A of 1 consistantly PT Short Term Goal 3 (Week 2): pt will ambulate 16ft with LRAD and skilled max A of 1  Skilled Therapeutic Interventions/Progress Updates:    Pt received asleep supine in bed requiring increased time to awaken fully and with encouragement/redirection pt agreeable to therapy session. Pt continues to have expressive>receptive aphasia using gestures and context clues to help therapist understand what she is trying to communicate. When therapist initiated removing R resting hand splint pt trying to communicate desire to put on wrist cock-up splint so therapist donned dependently - appears pt did not want to be without a brace on her R wrist. Supine>sitting R EOB, HOB partially elevated, with mod A for R LE management and bringing trunk upright. Sitting EOB maintains balance with supervision for safety. Pt reports need to use bathroom. Sit<>stands using stedy with light min A for lifting/balance and managing R UE. Stedy transfer in/out bathroom. Pt continent of bladder and performed peri-care set-up assist while standing in stedy with intermittent L UE support on stedy bar when LOB occurs with CGA/min A for balance. Pt pointing to shower and educated on plan to perform PT this morning with OT in afternoon to shower after gait training this morning - pt agreeable. While in perched sitting on stedy seat at sink performed hand hygiene and oral care - pt often picking L foot up off stedy platform and only putting weight through R LE in perched position - pt with poor awareness of fact that  she only has R LE support. Therapist attempts to encourage pt to remain upright OOB; however, she adamantly request to return to bed. Sitting EOB, threaded on pants mod/max A. Sit>stand EOB>L UE support from +2 with min A from therapist for lifting/balance and guarding R knee, pulled pants up over hips mod A. Sit>supine with min/mod A for R hemibody management into bed. Pt attempting to communicate her desire to go home and that she has an Aunt who had a stroke also - therapist educates on this being the only environment where she will receive daily therapy and encouraged to take advantage of this opportunity for her recovery - unsure pt's comprehension of this education due to aphasia. Pt left supine in bed with needs in reach, lights on, bed alarm on, and educated next PT is in 1 hour.  Therapy Documentation Precautions:  Precautions Precautions: Fall Precaution Comments: PEG, expressive aphasia with agitation, Rt hemi Restrictions Weight Bearing Restrictions Per Provider Order: Yes   Pain:  Has sensitivity around PEG site and has sensitivity with R hand/wrist area, requesting to wear wrist cock-up splint throughout session.    Therapy/Group: Individual Therapy  Ginny Forth , PT, DPT, NCS, CSRS 04/03/2023, 7:51 AM

## 2023-04-03 NOTE — Progress Notes (Signed)
PROGRESS NOTE   Subjective/Complaints: Discussed family training with mother on Monday, encouraged to maximize her time with therapy, commended on ambulation with therapy yesterday  ROS: Limited by aphasia, +right sided weakness, improved appetite   Objective:   No results found.  No results for input(s): "WBC", "HGB", "HCT", "PLT" in the last 72 hours.  No results for input(s): "NA", "K", "CL", "CO2", "GLUCOSE", "BUN", "CREATININE", "CALCIUM" in the last 72 hours.   Intake/Output Summary (Last 24 hours) at 04/03/2023 0942 Last data filed at 04/03/2023 0124 Gross per 24 hour  Intake 6482 ml  Output --  Net 6482 ml         Physical Exam: Vital Signs Blood pressure 91/60, pulse 88, temperature 97.9 F (36.6 C), resp. rate 16, height 5\' 4"  (1.626 m), weight 77.1 kg, SpO2 100%, unknown if currently breastfeeding.   General: NAD, sitting in w/c working with OT.  HEENT: Head is normocephalic, atraumatic, mucous membranes moist Neck: Supple without JVD or lymphadenopathy Heart: Reg rate and rhythm. No murmurs rubs or gallops Chest: CTA bilaterally without wheezes, rales, or rhonchi; no distress Abdomen: Positive bowel sounds, abdomen is soft, no significant tenderness, G tube c/d/i.  Urostomy tube in place. Extremities: No clubbing, cyanosis, or edema. Pulses are 2+ Psych: Appears frustrated at times when trying to speak Skin: Clean and intact without signs of breakdown over exposed surfaces, Gtube C/D/I Neuro: expressive aphasia, improving MsK: RUE/RLE flaccid, moves LUE/LLE antigravity Musculoskeletal:     Cervical back: Normal range of motion and neck supple.     Comments: LUE/LLE moving well again gravity RUE- no voluntary movement seen of RUE or RLE   Neurological:     Comments: Patient came back from working with therapy.  Awake and alert.  She does make eye contact with examiner.  Increased verbal output  today. MAS of 3-4 in R shoulder esp External rotation; MAS of 4 in R elbow- lacking 45 degrees of extension with a lot of work; MAS of 2-3 of R hand- in fist at rest, but could completely open fingers and get past wrist neutral.  Difficult to assess sensation, stable 04/03/23      Assessment/Plan: 1. Functional deficits which require 3+ hours per day of interdisciplinary therapy in a comprehensive inpatient rehab setting. Physiatrist is providing close team supervision and 24 hour management of active medical problems listed below. Physiatrist and rehab team continue to assess barriers to discharge/monitor patient progress toward functional and medical goals  Care Tool:  Bathing    Body parts bathed by patient: Right arm, Chest, Abdomen, Front perineal area   Body parts bathed by helper: Left arm, Buttocks, Right upper leg, Left upper leg, Right lower leg, Left lower leg, Face     Bathing assist Assist Level: Maximal Assistance - Patient 24 - 49%     Upper Body Dressing/Undressing Upper body dressing   What is the patient wearing?: Pull over shirt    Upper body assist Assist Level: Maximal Assistance - Patient 25 - 49%    Lower Body Dressing/Undressing Lower body dressing      What is the patient wearing?: Incontinence brief, Pants     Lower body  assist Assist for lower body dressing: Total Assistance - Patient < 25%     Toileting Toileting    Toileting assist Assist for toileting: 2 Helpers     Transfers Chair/bed transfer  Transfers assist  Chair/bed transfer activity did not occur: Safety/medical concerns (unsafe to get up)  Chair/bed transfer assist level: Moderate Assistance - Patient 50 - 74%     Locomotion Ambulation   Ambulation assist      Assist level: 2 helpers Assistive device: Other (comment) (LUE support) Max distance: 14ft   Walk 10 feet activity   Assist     Assist level: 2 helpers Assistive device: Other (comment) (LUE support)    Walk 50 feet activity   Assist Walk 50 feet with 2 turns activity did not occur: Safety/medical concerns  Assist level: 2 helpers Assistive device: Other (comment) (LUE support)    Walk 150 feet activity   Assist Walk 150 feet activity did not occur: Safety/medical concerns         Walk 10 feet on uneven surface  activity   Assist Walk 10 feet on uneven surfaces activity did not occur: Safety/medical concerns         Wheelchair     Assist Is the patient using a wheelchair?: Yes Type of Wheelchair: Manual Wheelchair activity did not occur: Safety/medical concerns         Wheelchair 50 feet with 2 turns activity    Assist    Wheelchair 50 feet with 2 turns activity did not occur: Safety/medical concerns       Wheelchair 150 feet activity     Assist  Wheelchair 150 feet activity did not occur: Safety/medical concerns       Blood pressure 91/60, pulse 88, temperature 97.9 F (36.6 C), resp. rate 16, height 5\' 4"  (1.626 m), weight 77.1 kg, SpO2 100%, unknown if currently breastfeeding.   Medical Problem List and Plan: 1. 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             -patient may  shower- cover PEG             -ELOS/Goals: 3-4 weeks mod to max A             -Continue CIR  Discussed prognosis for recovery  Discussed family training on Monday and encouraged patient to maximize her participation with therapy, commended her on ambulating  2.  Antiphospholipid antibody syndrome: discussed that Eliquis 5mg  BID will help to reduce risk of clotting/strokes  3. Pain Management: Neurontin 100 mg 3 times daily, Topamax 25 mg twice daily, oxycodone 5 mg every 4 hours as needed -03/30/23 some tingling in R arm, positional? Manageable with meds. OT working on releasing scapula and helping with positioning; monitor 4. Mood/Behavior/Sleep: Klonopin 0.5 mg nightly,  melatonin 5 mg nightly as needed -antipsychotic agents: suggest Depakote or increase in gabapentin to help calm pt down;  5. Neuropsych/cognition: This patient is not capable of making decisions on her own behalf. 6. Skin/Wound Care: routine skin care for PEG and overall skin 7. Fluids/Electrolytes/Nutrition: will assess labs, monitor I&Os, cont supplements -03/29/23 K a little low 3.4, see #18; Na improved. BMP otherwise fairly unremarkable.  -03/30/23 K 4.0 today, BMP otherwise ok today, monitor 8.  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  Followed by oncology/hematology   9.  Anemia/thrombocytopenia/iron deficiency/folic  acid deficiency/B12 deficiency.  Follow-up CBC.  Continue Niferex 150mg  daily, and supplements -1/24 hemoglobin a little lower again at 8.9, platelets overall stable at 79.  Not sure if some of hemoglobin decrease is delutional. Check occult blood-- not yet done 03/29/23   10.  Dysphagia.  Status post gastrostomy tube 03/21/2023 per interventional radiology.  Diet has been advanced to regular.  D/c TF. Discussed G-tube can be removed after 6-8 weeks from placement date  11.  E. coli UTI.  Antibiotic therapy completed 12.  Tobacco/alcohol use.  Counseling 13. Severe RUE spasticity- would strongly suggest Botox of RUE soon as well as resting hand splint for R wrist and R PRAFO- will order.   14. Abd pain- from PEG vs constipation- if no BM, might need KUB.  -IR consult to assess Gtube site.  Pus noted at skin site. Appears CT scan is ordered -Likely G-tube infection.  She is going for a IR procedure today.  Hospitalist team following-appreciate assistance.  She has been started on vancomycin and ceftriaxone for abdominal infection.  Echocardiogram and cultures have been ordered.  Spoke with hospitalist today, ID has been  consulted. -1/24 ID recommending vancomycin for 7 days total and can use linezolid if discharged prior to this -03/29/23 appreciate hospitalist help, repeat BCx neg so far, ID signed off; looks like abd xray ordered, pending. Advancing diet as above.  -03/30/23 no abd pain today, other than menstrual cramping, has tylenol if needed; abd xray yesterday fine. Hospitalist still following, no note yet today. Appreciate their assistance.    15.  Elevated transaminases-mild  Kpad ordered to minimize tylenol use  16.  Hyponatremia: resolved, monitor Na weekly.   17.  History of ileus: decrease feeding supplement to daily  18. Hypokalemia: klor 40 ordered 1/27, monitor potassium weekly  19. Right sided wrist drop: wrist cock-up splint ordered, continue prn  20. Right sided ankle instability: aircast ordered, discussed with PT that it has been received, continue prn  21. Anemia: discussed stool occult is positive, repeat ordered for today, CBC for today has not yet been drawn      LOS: 9 days A FACE TO FACE EVALUATION WAS PERFORMED  Drema Pry Anastasha Ortez 04/03/2023, 9:42 AM

## 2023-04-03 NOTE — Progress Notes (Signed)
Nutrition Follow-up  DOCUMENTATION CODES:   Not applicable  INTERVENTION:   Encourage PO intake, Room service with assist MVI with minerals daily Boost Breeze po TID, each supplement provides 250 kcal and 9 grams of protein Magic cup TID with meals, each supplement provides 290 kcal and 9 grams of protein Nighttime snack Recommend updated weight  Discontinue Ensure Enlive due to patients preference   NUTRITION DIAGNOSIS:   Inadequate oral intake related to lethargy/confusion as evidenced by meal completion < 50%.  - Progressing   GOAL:   Patient will meet greater than or equal to 90% of their needs  - Ongoing  MONITOR:   PO intake, TF tolerance  REASON FOR ASSESSMENT:   Consult Enteral/tube feeding initiation and management  ASSESSMENT:   Pt with PMH of Kells isoimmunization pregnancy, unprovoked PE with positive lupus anticoagulant, hemoglobin C trait, anemia iron deficient thrombocytopenia, tobacco use, has two children ages 66 and 49. 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  12/20 - admitted with expressive aphasia and right sided weakness (previous night drank significant amount of ETOH and took Suboxone) dx with L MCA and watershed territory infarcts with failed attempt at mechanical thrombectomy.  12/26 - MRI showed increased infarcts.  1/17 - PEG placed 1/21 - admitted to rehab 1/22 - Bolus TF stopped due to misplaced PEG 1/23- PEG tube replaced, 1/24 -  trickle feeds due to ileus  1/27 - D/C TF   Patient lying in bed with boyfriend on facetime during visit. Alert and able to communicate some but limited due to her aphasia. Her intake has been increasing, had on average average of 83% of her meals yesterday. Had baked chicken tenders, french fries, chocolate chip cookie, and magic cup for dinner. Had most of her breakfast today of bacon, potatoes, and orange juice. She had chick-fil-a at bedside for lunch. Does not like the Ensures, will try Boost breeze.    NUTRITION - FOCUSED PHYSICAL EXAM:  Flowsheet Row Most Recent Value  Orbital Region No depletion  Upper Arm Region No depletion  Thoracic and Lumbar Region No depletion  Buccal Region No depletion  Temple Region No depletion  Clavicle Bone Region No depletion  Clavicle and Acromion Bone Region No depletion  Scapular Bone Region No depletion  Dorsal Hand No depletion  Patellar Region No depletion  Anterior Thigh Region No depletion  Posterior Calf Region No depletion  Edema (RD Assessment) None  Hair Reviewed  Eyes Reviewed  Mouth Reviewed  Skin Reviewed  Nails Reviewed       Diet Order:   Diet Order             Diet regular Room service appropriate? Yes with Assist; Fluid consistency: Thin  Diet effective now                   EDUCATION NEEDS:   No education needs have been identified at this time  Skin:  Skin Assessment: Reviewed RN Assessment  Last BM:  04/02/2023 type 6  Height:   Ht Readings from Last 1 Encounters:  03/25/23 5\' 4"  (1.626 m)    Weight:   Wt Readings from Last 1 Encounters:  03/25/23 77.1 kg    Ideal Body Weight:  54.5 kg  BMI:  Body mass index is 29.18 kg/m.  Estimated Nutritional Needs:   Kcal:  2000-2200  Protein:  100-115 grams  Fluid:  >2L/day    Elliot Dally, RD Registered Dietitian  See Amion for more information

## 2023-04-03 NOTE — Progress Notes (Signed)
Occupational Therapy Session Note  Patient Details  Name: Jacqueline [name] MRN: [id number] Date of Birth: December 11, 2000  Today's Date: 04/03/2023 OT Individual Time: 1300-1415 OT Individual Time Calculation (min): 75 min    Short Term Goals: Week 2:  OT Short Term Goal 1 (Week 2): Pt will keep RUE in safe space during functional mobility with min questioning cues OT Short Term Goal 2 (Week 2): Pt will complete 2/3 toileting steps with Min A for standing balance OT Short Term Goal 3 (Week 2): Pt will donn UB clothing with mod A using hemi techniques OT Short Term Goal 4 (Week 2): Pt will donn RUE resting hand splint with min cues  Skilled Therapeutic Interventions/Progress Updates:  Skilled OT intervention completed with focus on ADL retraining, functional endurance, and mobility within a shower context. Pt received upright in bed, agreeable to session. Pt remained expressively aphasic but with improved ability to express needs and wants this date. Intermittent RUE/PEG site pain reported; pre-medicated. OT offered rest breaks, repositioning and moist heat via shower for pain relief.  Pt completed all sit > stands with CGA/supervision and dependent transfers with use of stedy for time management during session with cues needed for using LUE on grab bar and attention during mobility as pt easily distracted by music playing and shower tasks at times.  Transitioned to EOB with mod A for RLE and trunk elevation. Stedy transfer > TTB in shower. Per nursing coordinator; pt okay to have PEG drain uncovered during shower, avoiding direct spray of water with dressing change following therefore did not cover. Waterproof cover applied to IV prior to shower. Pt was able to bathe all parts with overall mod A for LUE, lifting RUE for access to underarm, and RLE. Pt with no spontaneous activation of flaccid RUE. Utilized long handled sponge to access LLE and pt laterally leaning with CGA for postural stability for  buttocks. Cues needed for thoroughness of all parts, with slight perseveration on periarea and thighs, and for sequencing. Stedy transfer > sink, where in perched position in front of mirror to address midline/postural endurance, pt applied vaseline to skin with mod A and deodorant with max A. OT doffed PEG dressing, cleaned skin surrounding, and reapplied dressing with paper tape; nursing made aware.   Stedy transfer > EOB, and assist was needed for lifting RLE into brief but no other assist for threading, then stood in stedy with assist for brief over hips. Sit > supine bed mobility with mod A. OT donned RUE resting hand splint for night time positioning. Pt remained semi upright in bed, with bed alarm on/activated, and with all needs in reach at end of session.   Therapy Documentation Precautions:  Precautions Precautions: Fall Precaution Comments: PEG, expressive aphasia with agitation, Rt hemi Restrictions Weight Bearing Restrictions Per Provider Order: Yes    Therapy/Group: Individual Therapy  Melvyn Novas, MS, OTR/L  04/03/2023, 2:52 PM

## 2023-04-04 LAB — CBC
HCT: 19.7 % — ABNORMAL LOW (ref 36.0–46.0)
Hemoglobin: 6.9 g/dL — CL (ref 12.0–15.0)
MCH: 27.7 pg (ref 26.0–34.0)
MCHC: 35 g/dL (ref 30.0–36.0)
MCV: 79.1 fL — ABNORMAL LOW (ref 80.0–100.0)
Platelets: 90 10*3/uL — ABNORMAL LOW (ref 150–400)
RBC: 2.49 MIL/uL — ABNORMAL LOW (ref 3.87–5.11)
RDW: 21.2 % — ABNORMAL HIGH (ref 11.5–15.5)
WBC: 5.4 10*3/uL (ref 4.0–10.5)
nRBC: 0 % (ref 0.0–0.2)

## 2023-04-04 LAB — COMPREHENSIVE METABOLIC PANEL
ALT: 35 U/L (ref 0–44)
AST: 33 U/L (ref 15–41)
Albumin: 2.4 g/dL — ABNORMAL LOW (ref 3.5–5.0)
Alkaline Phosphatase: 78 U/L (ref 38–126)
Anion gap: 10 (ref 5–15)
BUN: 13 mg/dL (ref 6–20)
CO2: 20 mmol/L — ABNORMAL LOW (ref 22–32)
Calcium: 8.1 mg/dL — ABNORMAL LOW (ref 8.9–10.3)
Chloride: 107 mmol/L (ref 98–111)
Creatinine, Ser: 0.88 mg/dL (ref 0.44–1.00)
GFR, Estimated: >60 mL/min (ref >60)
Glucose, Bld: 110 mg/dL — ABNORMAL HIGH (ref 70–99)
Potassium: 3.3 mmol/L — ABNORMAL LOW (ref 3.5–5.1)
Sodium: 137 mmol/L (ref 135–145)
Total Bilirubin: 0.3 mg/dL (ref 0.0–1.2)
Total Protein: 6.2 g/dL — ABNORMAL LOW (ref 6.5–8.1)

## 2023-04-04 MED ORDER — SODIUM CHLORIDE 0.9 % IV SOLN: INTRAVENOUS | Status: DC

## 2023-04-04 MED ORDER — SODIUM CHLORIDE 0.9% IV SOLUTION: Freq: Once | INTRAVENOUS | Status: DC

## 2023-04-04 NOTE — Progress Notes (Signed)
PROGRESS NOTE   Subjective/Complaints: Currently has menses with heavy flow and large clots, fecal occult positive, had asked nursing to send another prior to start of menses  ROS: Limited by aphasia, +right sided weakness, improved appetite, +menses with heavy flow   Objective:   No results found.  Recent Labs    04/03/23 0950  WBC 5.4  HGB 8.3*  HCT 23.9*  PLT 72*    No results for input(s): "NA", "K", "CL", "CO2", "GLUCOSE", "BUN", "CREATININE", "CALCIUM" in the last 72 hours.   Intake/Output Summary (Last 24 hours) at 04/04/2023 1112 Last data filed at 04/03/2023 2157 Gross per 24 hour  Intake 1040 ml  Output --  Net 1040 ml         Physical Exam: Vital Signs Blood pressure (!) 87/46, pulse 75, temperature 98.8 F (37.1 C), temperature source Oral, resp. rate 17, height 5\' 4"  (1.626 m), weight 77.1 kg, SpO2 100%, unknown if currently breastfeeding.   General: NAD, sitting in w/c working with OT.  HEENT: Head is normocephalic, atraumatic, mucous membranes moist Neck: Supple without JVD or lymphadenopathy Heart: Reg rate and rhythm. No murmurs rubs or gallops Chest: CTA bilaterally without wheezes, rales, or rhonchi; no distress Abdomen: Positive bowel sounds, abdomen is soft, no significant tenderness, G tube c/d/i.  Urostomy tube in place. Extremities: No clubbing, cyanosis, or edema. Pulses are 2+ Psych: Appears frustrated at times when trying to speak Skin: Clean and intact without signs of breakdown over exposed surfaces, Gtube C/D/I Neuro: expressive aphasia, improving MsK: RUE/RLE flaccid, moves LUE/LLE antigravity Musculoskeletal:     Cervical back: Normal range of motion and neck supple.     Comments: LUE/LLE moving well again gravity RUE- no voluntary movement seen of RUE or RLE   Neurological:     Comments: Patient came back from working with therapy.  Awake and alert.  She does make eye  contact with examiner.  Increased verbal output today. MAS of 3-4 in R shoulder esp External rotation; MAS of 4 in R elbow- lacking 45 degrees of extension with a lot of work; MAS of 2-3 of R hand- in fist at rest, but could completely open fingers and get past wrist neutral.  Difficult to assess sensation, stable 1/31    Assessment/Plan: 1. Functional deficits which require 3+ hours per day of interdisciplinary therapy in a comprehensive inpatient rehab setting. Physiatrist is providing close team supervision and 24 hour management of active medical problems listed below. Physiatrist and rehab team continue to assess barriers to discharge/monitor patient progress toward functional and medical goals  Care Tool:  Bathing    Body parts bathed by patient: Right arm, Chest, Abdomen, Front perineal area, Buttocks, Right upper leg, Left upper leg, Left lower leg, Face   Body parts bathed by helper: Right lower leg, Left arm     Bathing assist Assist Level: Moderate Assistance - Patient 50 - 74%     Upper Body Dressing/Undressing Upper body dressing   What is the patient wearing?: Pull over shirt    Upper body assist Assist Level: Minimal Assistance - Patient > 75%    Lower Body Dressing/Undressing Lower body dressing  What is the patient wearing?: Incontinence brief     Lower body assist Assist for lower body dressing: Maximal Assistance - Patient 25 - 49%     Toileting Toileting    Toileting assist Assist for toileting: 2 Helpers     Transfers Chair/bed transfer  Transfers assist  Chair/bed transfer activity did not occur: Safety/medical concerns (unsafe to get up)  Chair/bed transfer assist level: Moderate Assistance - Patient 50 - 74%     Locomotion Ambulation   Ambulation assist      Assist level: 2 helpers Assistive device: Other (comment) (L HHA) Max distance: 72ft   Walk 10 feet activity   Assist     Assist level: 2 helpers Assistive  device: Other (comment) (L HHA)   Walk 50 feet activity   Assist Walk 50 feet with 2 turns activity did not occur: Safety/medical concerns  Assist level: 2 helpers Assistive device: Other (comment) (L HHA)    Walk 150 feet activity   Assist Walk 150 feet activity did not occur: Safety/medical concerns         Walk 10 feet on uneven surface  activity   Assist Walk 10 feet on uneven surfaces activity did not occur: Safety/medical concerns         Wheelchair     Assist Is the patient using a wheelchair?: Yes Type of Wheelchair: Manual Wheelchair activity did not occur: Safety/medical concerns         Wheelchair 50 feet with 2 turns activity    Assist    Wheelchair 50 feet with 2 turns activity did not occur: Safety/medical concerns       Wheelchair 150 feet activity     Assist  Wheelchair 150 feet activity did not occur: Safety/medical concerns       Blood pressure (!) 87/46, pulse 75, temperature 98.8 F (37.1 C), temperature source Oral, resp. rate 17, height 5\' 4"  (1.626 m), weight 77.1 kg, SpO2 100%, unknown if currently breastfeeding.   Medical Problem List and Plan: 1. 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             -patient may  shower- cover PEG             -ELOS/Goals: 3-4 weeks mod to max A             -Continue CIR  Discussed prognosis for recovery  Discussed family training on Monday and encouraged patient to maximize her participation with therapy, commended her on ambulating  2.  Antiphospholipid antibody syndrome: discussed that Eliquis 5mg  BID will help to reduce risk of clotting/strokes  3. Pain Management: Neurontin 100 mg 3 times daily, Topamax 25 mg twice daily, oxycodone 5 mg every 4 hours as needed -03/30/23 some tingling in R arm, positional? Manageable with meds. OT working on releasing scapula and helping with positioning;  monitor 4. Mood/Behavior/Sleep: Klonopin 0.5 mg nightly, melatonin 5 mg nightly as needed -antipsychotic agents: suggest Depakote or increase in gabapentin to help calm pt down;  5. Neuropsych/cognition: This patient is not capable of making decisions on her own behalf. 6. Skin/Wound Care: routine skin care for PEG and overall skin 7. Fluids/Electrolytes/Nutrition: will assess labs, monitor I&Os, cont supplements -03/29/23 K a little low 3.4, see #18; Na improved. BMP otherwise fairly unremarkable.  -03/30/23 K 4.0 today, BMP otherwise ok today, monitor 8.  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  Followed by oncology/hematology   9.  Thrombocytopenia/iron deficiency/folic acid deficiency/B12 deficiency.  Follow-up CBC.  Continue Niferex 150mg  daily, and supplements   10.  Dysphagia.  Status post gastrostomy tube 03/21/2023 per interventional radiology.  Diet has been advanced to regular.  D/c TF. Discussed G-tube can be removed after 6-8 weeks from placement date  11.  E. coli UTI.  Antibiotic therapy completed 12.  Tobacco/alcohol use.  Counseling 13. Severe RUE spasticity- would strongly suggest Botox of RUE soon as well as resting hand splint for R wrist and R PRAFO- will order.   14. Abd pain- from PEG vs constipation- if no BM, might need KUB.  -IR consult to assess Gtube site.  Pus noted at skin site. Appears CT scan is ordered -Likely G-tube infection.  She is going for a IR procedure today.  Hospitalist team following-appreciate assistance.  She has been started on vancomycin and ceftriaxone for abdominal infection.  Echocardiogram and cultures have been ordered.  Spoke with hospitalist today, ID has been consulted. -1/24 ID recommending vancomycin for 7 days total and can use linezolid if discharged prior to this -03/29/23 appreciate  hospitalist help, repeat BCx neg so far, ID signed off; looks like abd xray ordered, pending. Advancing diet as above.  -03/30/23 no abd pain today, other than menstrual cramping, has tylenol if needed; abd xray yesterday fine. Hospitalist still following, no note yet today. Appreciate their assistance.    15.  Elevated transaminases-mild  Kpad ordered to minimize tylenol use  16.  Hyponatremia: resolved, monitor Na weekly.   17.  History of ileus: d/c feeding supplements and continue regular diet  18. Hypokalemia: klor 40 ordered 1/27, monitor potassium weekly  19. Right sided wrist drop: wrist cock-up splint ordered, continue prn  20. Right sided ankle instability: aircast ordered, discussed with PT that it has been received, continue prn  21. Anemia: discussed stool occult is positive, repeat ordered for today, CBC for today has not yet been drawn, chart reviewed and could be secondary to menses with started 1/31      LOS: 10 days A FACE TO FACE EVALUATION WAS PERFORMED  Jacqueline [name] P Jacqueline [name] 04/04/2023, 11:12 AM

## 2023-04-04 NOTE — Progress Notes (Signed)
Patient with menses this shift.  Flow is very heavy with very large clots.  Clots abut lemon sized.  3 at shift change.

## 2023-04-04 NOTE — Progress Notes (Addendum)
Received a call at 8:20pm tonight 04/04/23, reporting yellow MEWS score due to HR 103 and BP 94/47. No fever, otherwise seeming ok.  RN unsure if pt still having heavy menses, appears that she's been passing clots recently but RN unaware if still ongoing.  Has been eating and drinking well, no longer on tube feeds according to RN.  Review of recent labs reveals Hgb has gone from 12>11.1>9.7>8.9>8.8>8.3 since 03/21/23 which coincides with her menses starting last weekend 03/29/23.  Her HR has been in the upper 90s the last 24hrs, and BPs have been soft (mostly 90s/60s). Unclear if she's had any other symptoms related to this BP, pt with expressive aphasia.   Highly suspect her VS tonight are related to her continued drop in Hgb, related to her menses. Looks like she's had Hgb's in the 9's in the past, intermittently.   Plan: -repeat CBC and CMP now -type and screen in the event we need to transfuse -start IVFs NS @150 /hr -Call me or rapid response overnight if her VS worsen or she becomes unstable  Highly anticipate she might need blood transfusion. Will monitor for results tonight.   Charlynn Salih PA-C 04/04/23 9:46 PM    ADDENDUM 11:03 PM: RN called back regarding critical lab, Hgb 6.9. Likely cause of vitals/MEWS score.  Apparently IV team will need to come back to get better IV.  Had RN relay message to pt regarding need for blood, pt seemed to agree (nurse states she mumbled "yes"). Currently, patient doesn't entirely have capacity-- but given urgent nature of issue with dropping Hgb and active menstrual bleeding, will proceed with urgent blood transfusion. Of note, pt did seem to understand things last weekend, just can't express herself, so I feel she likely has some level of understanding. Will speak with family/HCPOA in the morning regarding this decision-- but I feel we have pt's consent to the best of our ability, and the urgent nature of this warrants proceeding tonight.   Updated  plan: -Transfuse 2U PRBC now, after IV replaced.  -HOLD ELIQUIS FOR NOW -Will consult OBGYN in the morning regarding next steps (?megace) to stop menstrual bleeding-- pt stable for now, don't feel we need to call immediately tonight.  -If further decompensation occurs, call rapid response team-- and would likely need to more urgently contact OBGYN provider.   [address] PA-C 04/04/23  11:12 PM

## 2023-04-04 NOTE — Progress Notes (Signed)
Occupational Therapy Session Note  Patient Details  Name: Jacqueline [name] MRN: [id number] Date of Birth: 2000-11-15  Today's Date: 04/04/2023 OT Individual Time: 1300-1353 OT Individual Time Calculation (min): 53 min  and Today's Date: 04/04/2023 OT Missed Time: 22 Minutes Missed Time Reason: Pain;Patient fatigue   Short Term Goals: Week 2:  OT Short Term Goal 1 (Week 2): Pt will keep RUE in safe space during functional mobility with min questioning cues OT Short Term Goal 2 (Week 2): Pt will complete 2/3 toileting steps with Min A for standing balance OT Short Term Goal 3 (Week 2): Pt will donn UB clothing with mod A using hemi techniques OT Short Term Goal 4 (Week 2): Pt will donn RUE resting hand splint with min cues  Skilled Therapeutic Interventions/Progress Updates:  Skilled OT intervention completed with focus on ADL retraining, functional transfers, R NMR. Pt received supine in bed, asleep. Pt required unreasonable amount of time to arouse, with pt demonstrating behavior issues, including pulling blanket over head, yelling "no" at therapist when initiating therapy and initial refusal to all offers. OT utilized firm encouragement and opened blinds for natural light and removed blankets total A from pt. Pt then attended to OT. Pt did verbalize menstrual related stomach cramps, and fatigue, but with above methods, pt agreeable to at least get OOB for toileting opportunity. Nurse already aware of status; pt pre-medicated.  Transitioned to EOB with mod A overall for RLE and trunk elevation. Completed mod A squat pivot > R to w/c with cues for RLE positioning and sequencing. Transported to bathroom. Completed mod A sit > stand, then mod A stand pivot with guarding of RLE and Rt hemi body > BSC with verbal cues needed for placement of RLE. Pt assisted with doffing of brief, overall min A from OT to doff, but mod A for dynamic standing balance. Continent of urinary void. Pt still on her menses,  therefore threaded new with max A. Stood mod A, then pt completed pericare with mod A for balance, then donned brief over hips with +2 present. Mod A stand pivot > w/c.   Seated at sink, pt washed both hands with mod A for Rt hand. Donned resting hand splint with total A. Mod A squat pivot >  EOB with cues for sequencing and positioning. Min A needed to open flip container for cupcake that pt desired to eat. No assist for feeding. Pt remained upright in bed, with bed alarm on/activated, and with all needs in reach at end of session. Pt missed 22 mins of OT intervention secondary to pain/fatigue; OT will make up missed time as able.   [place] Stim One  Applied for 60 mins (unattended) to the Rt deltoid for NMR and sublux management with the following parameters:  330 pulse width 35 Hz pulse rate On 8 sec/ off 8 sec Ramp up/ down 2 sec Symmetrical Biphasic wave form  Max intensity at 500 Ohm load  Removed from pt at end of cycle with no adverse skin reaction or irritation noted.  Therapy Documentation Precautions:  Precautions Precautions: Fall Precaution Comments: PEG, expressive aphasia with agitation, Rt hemi Restrictions Weight Bearing Restrictions Per Provider Order: Yes   Therapy/Group: Individual Therapy  Melvyn Novas, MS, OTR/L  04/04/2023, 2:06 PM

## 2023-04-04 NOTE — Progress Notes (Signed)
Physical Therapy Session Note  Patient Details  Name: Jacqueline [name] MRN: [id number] Date of Birth: May 26, 2000  Today's Date: 04/04/2023 PT Individual Time: 0731-0843 PT Individual Time Calculation (min): 72 min   Short Term Goals: Week 1:  PT Short Term Goal 1 (Week 1): pt will transfer supine<>sitting EOB with mod A of 1 PT Short Term Goal 1 - Progress (Week 1): Met PT Short Term Goal 2 (Week 1): pt will transfer sit<>stand with LRAD and mod A of 1 PT Short Term Goal 2 - Progress (Week 1): Met PT Short Term Goal 3 (Week 1): pt will ambulate 55ft with LRAD and max A of 1 PT Short Term Goal 3 - Progress (Week 1): Progressing toward goal Week 2:  PT Short Term Goal 1 (Week 2): pt will perform all aspects of bed mobility with mod A consistantly PT Short Term Goal 2 (Week 2): pt will transfer bed<>chair with mod A of 1 consistantly PT Short Term Goal 3 (Week 2): pt will ambulate 4ft with LRAD and skilled max A of 1  Skilled Therapeutic Interventions/Progress Updates:   Received pt supine in bed asleep. Pt required significantly increased time, maximal encouragement, and verbal/tactile/environmental stimuli to arouse. Pt ultimately agreeable to PT treatment despite frustration from early morning PT session but denied any pain during session. Session with emphasis on functional mobility/transfers, toileting, dressing, generalized strengthening and endurance, dynamic standing balance/coordination, and gait training. Pt transferred supine<>sitting R EOB from flat bed with mod A for RLE management. Pt with 1 anterior LOB upon sitting up, requiring max A to correct. Pt reported urge to toilet and donned socks with max A - increased frustration/agitation noted while waiting for tech to [employer]. Stood in [place] with min A and transferred to/from toilet with bedside commode over top dependently. Pt performed multiple stands in [place] with CGA/light min A and dependent for clothing management. Brief  saturated with blood from menstrual cycle - removed and donned clean brief and pants with total A. Stood for Immunologist, however blood dripped onto clean brief/pants, therefore sat and removed dirty clothing and donned clean brief/scrub pants with max A for time management purposes - pt was able to perform hygiene management in standing using LUE.  Transferred from bedside commode to [facility] in [place] dependently and donned shoes with max A +2 using shoe horn - pt needs larger size shoe (7.5) to trial AFO but pt continues to refuse - will discuss with [name] (boyfriend). Pt transported to/from room in [facility] dependently for time management purposes. Stood from [facility] with mod A (cues to scoot to edge of WC and to push up with LUE from WC armrest) - when standing, noted increased tone in hamstrings, causing foot to come off floor and get stuck underneath WC; ultimately required +2 assist to correct. Pt then ambulated 35ft with R shoe cover and mod A +2 providing min/mod L HHA. Pt required assist to advance/place RLE and min blocking from buckling (no hyperextention noted today). Pt continues to require max cues for safety, attention, and sequencing as pt attempts to sit prematurely.  Returned to room and pt refused to remain in [facility] to eat breakfast desptte education on benefits of OOB mobility. Transferred WC<>bed stand<>pivot to R with mod A while blocking R knee. Removed shoes and transferred into supine with mod A for RLE management. Bridged to remove pants (assist to maintain RLE in hooklying) and set pt up to eat breakfast. Concluded session with pt semi-reclined in  bed, needs within reach, and bed alarm on.   Therapy Documentation Precautions:  Precautions Precautions: Fall Precaution Comments: PEG, expressive aphasia with agitation, Rt hemi Restrictions Weight Bearing Restrictions Per Provider Order: Yes  Therapy/Group: Individual Therapy Marlana Salvage Zaunegger Blima Rich PT, DPT 04/04/2023, 6:53 AM

## 2023-04-04 NOTE — Progress Notes (Signed)
SLP Cancellation Note  Patient Details Name: Jacqueline [name] MRN: [id number] DOB: [birth year]   Cancelled treatment:        Patient missed 60 minutes SLP therapy this date d/t pain/refusal. SLP attempted x2 with patient declining first attempt due to headache pain and second attempt 45 minutes later d/t refusal to participate. Patient was left in lowered bed with call bell in reach and bed alarm set. SLP will continue to target goals per plan of care.                                                                                              Jeannie Done, M.A., CCC-SLP   Yetta Barre 04/04/2023, 12:52 PM

## 2023-04-05 ENCOUNTER — Inpatient Hospital Stay (HOSPITAL_COMMUNITY): Payer: Medicaid Other

## 2023-04-05 DIAGNOSIS — D62 Acute posthemorrhagic anemia: Secondary | ICD-10-CM

## 2023-04-05 DIAGNOSIS — N920 Excessive and frequent menstruation with regular cycle: Secondary | ICD-10-CM

## 2023-04-05 DIAGNOSIS — D689 Coagulation defect, unspecified: Secondary | ICD-10-CM

## 2023-04-05 LAB — CBC
HCT: 24.6 % — ABNORMAL LOW (ref 36.0–46.0)
Hemoglobin: 8.5 g/dL — ABNORMAL LOW (ref 12.0–15.0)
MCH: 27.9 pg (ref 26.0–34.0)
MCHC: 34.6 g/dL (ref 30.0–36.0)
MCV: 80.7 fL (ref 80.0–100.0)
Platelets: 83 10*3/uL — ABNORMAL LOW (ref 150–400)
RBC: 3.05 MIL/uL — ABNORMAL LOW (ref 3.87–5.11)
RDW: 18.2 % — ABNORMAL HIGH (ref 11.5–15.5)
WBC: 6.3 10*3/uL (ref 4.0–10.5)
nRBC: 0 % (ref 0.0–0.2)

## 2023-04-05 LAB — PREPARE RBC (CROSSMATCH)

## 2023-04-05 MED ORDER — POTASSIUM CHLORIDE 20 MEQ PO PACK
60.0000 meq | PACK | Freq: Once | ORAL | Status: AC
  Administered 2023-04-05: 60 meq via ORAL
  Filled 2023-04-05: qty 3

## 2023-04-05 MED ORDER — APIXABAN 5 MG PO TABS
5.0000 mg | ORAL_TABLET | Freq: Two times a day (BID) | ORAL | Status: DC
  Administered 2023-04-05 – 2023-04-23 (×36): 5 mg via ORAL
  Filled 2023-04-05 (×36): qty 1

## 2023-04-05 MED ORDER — MEGESTROL ACETATE 40 MG PO TABS
40.0000 mg | ORAL_TABLET | Freq: Two times a day (BID) | ORAL | Status: DC
  Administered 2023-04-05 – 2023-04-18 (×26): 40 mg via ORAL
  Filled 2023-04-05 (×27): qty 1

## 2023-04-05 NOTE — Progress Notes (Signed)
PROGRESS NOTE   Subjective/Complaints:  Pt doing ok this morning, fired a MEWS score last night d/t hypotension/tachycardia, found to be anemic, is getting second unit of blood now. Pt agreeable to all of this, and spoke with [name] this morning about it as well.  Pt didn't sleep well because of all of the commotion last night. Pain well controlled, but does endorse some abd cramping still. Unsure of LBM but appears it was 2 days ago. Urinating ok.  Pt sleepy and not wanting to be bothered, so doesn't really participate further in questioning.   ROS: Limited by aphasia, +right sided weakness, improved appetite, +menses with heavy flow   Objective:   No results found.  Recent Labs    04/03/23 0950 04/04/23 2216  WBC 5.4 5.4  HGB 8.3* 6.9*  HCT 23.9* 19.7*  PLT 72* 90*    Recent Labs    04/04/23 2216  NA 137  K 3.3*  CL 107  CO2 20*  GLUCOSE 110*  BUN 13  CREATININE 0.88  CALCIUM 8.1*     Intake/Output Summary (Last 24 hours) at 04/05/2023 1037 Last data filed at 04/05/2023 1001 Gross per 24 hour  Intake 2010.92 ml  Output --  Net 2010.92 ml         Physical Exam: Vital Signs Blood pressure (!) 90/51, pulse 86, temperature 98.8 F (37.1 C), temperature source Oral, resp. rate 18, height 5\' 4"  (1.626 m), weight 77.1 kg, SpO2 100%, unknown if currently breastfeeding.   General: NAD, laying in bed, doesn't want to be bothered.  HEENT: Head is normocephalic, atraumatic, mucous membranes moist Neck: Supple without JVD or lymphadenopathy Heart: Reg rate and rhythm. No murmurs rubs or gallops Chest: CTA bilaterally without wheezes, rales, or rhonchi; no distress Abdomen: Positive bowel sounds, abdomen is soft, no significant tenderness but a little tenderness to lower abdomen, nondistended, G tube c/d/i.  Urostomy tube in place. Extremities: No clubbing, cyanosis, or edema. Pulses are 2+ Psych:  Appears frustrated and irritable  Skin: Clean and intact without signs of breakdown over exposed surfaces, Gtube C/D/I Neuro: expressive aphasia, improving but doesn't really participate much today MsK: RUE/RLE flaccid, moves LUE/LLE antigravity  PRIOR EXAMS: Musculoskeletal:     Cervical back: Normal range of motion and neck supple.     Comments: LUE/LLE moving well again gravity RUE- no voluntary movement seen of RUE or RLE   Neurological:     Comments: Patient came back from working with therapy.  Awake and alert.  She does make eye contact with examiner.  Increased verbal output today. MAS of 3-4 in R shoulder esp External rotation; MAS of 4 in R elbow- lacking 45 degrees of extension with a lot of work; MAS of 2-3 of R hand- in fist at rest, but could completely open fingers and get past wrist neutral.  Difficult to assess sensation, stable 1/31    Assessment/Plan: 1. Functional deficits which require 3+ hours per day of interdisciplinary therapy in a comprehensive inpatient rehab setting. Physiatrist is providing close team supervision and 24 hour management of active medical problems listed below. Physiatrist and rehab team continue to assess barriers to discharge/monitor patient  progress toward functional and medical goals  Care Tool:  Bathing    Body parts bathed by patient: Right arm, Chest, Abdomen, Front perineal area, Buttocks, Right upper leg, Left upper leg, Left lower leg, Face   Body parts bathed by helper: Right lower leg, Left arm     Bathing assist Assist Level: Moderate Assistance - Patient 50 - 74%     Upper Body Dressing/Undressing Upper body dressing   What is the patient wearing?: Pull over shirt    Upper body assist Assist Level: Minimal Assistance - Patient > 75%    Lower Body Dressing/Undressing Lower body dressing      What is the patient wearing?: Incontinence brief     Lower body assist Assist for lower body dressing: Maximal Assistance  - Patient 25 - 49%     Toileting Toileting    Toileting assist Assist for toileting: 2 Helpers     Transfers Chair/bed transfer  Transfers assist  Chair/bed transfer activity did not occur: Safety/medical concerns (unsafe to get up)  Chair/bed transfer assist level: Moderate Assistance - Patient 50 - 74%     Locomotion Ambulation   Ambulation assist      Assist level: 2 helpers Assistive device: Other (comment) (L HHA) Max distance: 108ft   Walk 10 feet activity   Assist     Assist level: 2 helpers Assistive device: Other (comment) (L HHA)   Walk 50 feet activity   Assist Walk 50 feet with 2 turns activity did not occur: Safety/medical concerns  Assist level: 2 helpers Assistive device: Other (comment) (L HHA)    Walk 150 feet activity   Assist Walk 150 feet activity did not occur: Safety/medical concerns         Walk 10 feet on uneven surface  activity   Assist Walk 10 feet on uneven surfaces activity did not occur: Safety/medical concerns         Wheelchair     Assist Is the patient using a wheelchair?: Yes Type of Wheelchair: Manual Wheelchair activity did not occur: Safety/medical concerns         Wheelchair 50 feet with 2 turns activity    Assist    Wheelchair 50 feet with 2 turns activity did not occur: Safety/medical concerns       Wheelchair 150 feet activity     Assist  Wheelchair 150 feet activity did not occur: Safety/medical concerns       Blood pressure (!) 90/51, pulse 86, temperature 98.8 F (37.1 C), temperature source Oral, resp. rate 18, height 5\' 4"  (1.626 m), weight 77.1 kg, SpO2 100%, unknown if currently breastfeeding.   Medical Problem List and Plan: 1. 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  Antiphospholipid antibody syndrome: discussed that Eliquis 5mg  BID will help to reduce risk of clotting/strokes  -04/05/23 see #21 regarding anemia/drop in Hgb  3. Pain Management: Neurontin 100 mg 3 times daily, Topamax 25 mg twice daily, oxycodone 5 mg every 4 hours as needed -03/30/23 some tingling in  R arm, positional? Manageable with meds. OT working on releasing scapula and helping with positioning; monitor -04/05/23 pain controlled, cont regimen  4. Mood/Behavior/Sleep: Klonopin 0.5 mg nightly, melatonin 5 mg nightly as needed -antipsychotic agents: suggest Depakote or increase in gabapentin to help calm pt down;  5. Neuropsych/cognition: This patient is not capable of making decisions on her own behalf. 6. Skin/Wound Care: routine skin care for PEG and overall skin 7. Fluids/Electrolytes/Nutrition: will assess labs, monitor I&Os, cont supplements -03/29/23 K a little low 3.4, see #18; Na improved. BMP otherwise fairly unremarkable.  -03/30/23 K 4.0 today, BMP otherwise ok today, monitor -04/05/23 K 3.3, will give today, monitor  8.  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  Seen by oncology/hematology once this year-- needs to f/up  9.  Thrombocytopenia/iron deficiency/folic acid deficiency/B12 deficiency.  Follow-up CBC.  Continue Niferex 150mg  daily, and supplements  -04/05/23 Plt 90, stable, monitor   10.  Dysphagia.  Status post gastrostomy tube 03/21/2023 per interventional radiology.  Diet has been advanced to regular.  D/c TF. Discussed G-tube can be removed after 6-8 weeks from placement date  11.  E. coli UTI.  Antibiotic therapy completed 12.   Tobacco/alcohol use.  Counseling 13. Severe RUE spasticity- would strongly suggest Botox of RUE soon as well as resting hand splint for R wrist and R PRAFO- will order.   14. Abd pain- from PEG vs constipation- if no BM, might need KUB.  -IR consult to assess Gtube site.  Pus noted at skin site. Appears CT scan is ordered -Likely G-tube infection.  She is going for a IR procedure today.  Hospitalist team following-appreciate assistance.  She has been started on vancomycin and ceftriaxone for abdominal infection.  Echocardiogram and cultures have been ordered.  Spoke with hospitalist today, ID has been consulted. -1/24 ID recommending vancomycin for 7 days total and can use linezolid if discharged prior to this -03/29/23 appreciate hospitalist help, repeat BCx neg so far, ID signed off; looks like abd xray ordered, pending. Advancing diet as above.  -03/30/23 no abd pain today, other than menstrual cramping, has tylenol if needed; abd xray yesterday fine. Hospitalist still following, no note yet today. Appreciate their assistance.  -04/05/23 still with abd cramping-- no significant tenderness; getting TVUS as below; monitor   15.  Elevated transaminases-mild, improved  Kpad ordered to minimize tylenol use  16.  Hyponatremia: resolved, monitor Na weekly. Improved 04/05/23  17.  History of ileus: d/c feeding supplements and continue regular diet  18. Hypokalemia: klor 40 ordered 1/27, monitor potassium weekly-- see #7  19. Right sided wrist drop: wrist cock-up splint ordered, continue prn  20. Right sided ankle instability: aircast ordered, discussed with PT that it has been received, continue prn  21. Anemia/menorrhagia: discussed stool occult is positive, repeat ordered for today, CBC for today has not yet been drawn, chart reviewed and could be secondary to menses with started 1/31 -04/05/23 pt with dropping hgb the entire week, heavy menses all week, Hgb down to 6.9 overnight with associated  hypotension/tachycardia, got 2U PRBCs. VS improving/stable. Spoke with mom and updated her. Pt understands, too.  -Spoke with OBGYN on call Dr. Nobie Putnam who requested we get a TVUS and start Megace 40mg  BID, continue Eliquis, they will f/up with TVUS once it's done, may need to increase Megace to 80mg  if heavy bleeding persists but for now  start with 40mg  BID; monitor closely. F/up CBC after transfusion  I spent >63mins performing patient care related activities, including face to face time, documentation time, management of critical hgb/transfusion, discussion of care with patient, nursing staff, OBGYN, and mom, and overall coordination of care.       LOS: 11 days A FACE TO FACE EVALUATION WAS PERFORMED  [address] [place] Jacqueline [name] 04/05/2023, 10:37 AM

## 2023-04-05 NOTE — Plan of Care (Signed)
  Problem: Consults Goal: RH STROKE PATIENT EDUCATION Description: See Patient Education module for education specifics  Outcome: Progressing   Problem: RH BOWEL ELIMINATION Goal: RH STG MANAGE BOWEL WITH ASSISTANCE Description: STG Manage Bowel with medications with minimal Assistance. Outcome: Progressing   Problem: RH BLADDER ELIMINATION Goal: RH STG MANAGE BLADDER WITH ASSISTANCE Description: STG Manage Bladder with minimal Assistance Outcome: Progressing   Problem: RH SAFETY Goal: RH STG ADHERE TO SAFETY PRECAUTIONS W/ASSISTANCE/DEVICE Description: STG Adhere to Safety Precautions with min A Outcome: Progressing   Problem: RH PAIN MANAGEMENT Goal: RH STG PAIN MANAGED AT OR BELOW PT'S PAIN GOAL Description: <4w/ prns Outcome: Progressing   Problem: RH BOWEL ELIMINATION Goal: RH STG MANAGE BOWEL W/MEDICATION W/ASSISTANCE Description: STG Manage Bowel with Medication with Assistance. Outcome: Progressing

## 2023-04-05 NOTE — Progress Notes (Signed)
2nd unit of PRBC transfusion started at 0942 am and completed at 1339.  Patient tolerated well with no adverse reactions noted. Vital signs stable. IV flushed and patent w/o any complications.  Patient educated on signs of delayed transfusion and instructed to call if there are any concerns.   Bed in low position, wheels locked, call-bell within reach. Other safety measures are implemented.

## 2023-04-06 DIAGNOSIS — K5901 Slow transit constipation: Secondary | ICD-10-CM

## 2023-04-06 LAB — TYPE AND SCREEN
Antibody Screen: POSITIVE
DAT, IgG: POSITIVE
Donor AG Type: NEGATIVE
Donor AG Type: NEGATIVE
Unit division: 0
Unit division: 0

## 2023-04-06 LAB — BPAM RBC
Blood Product Expiration Date: 202502142359
Blood Product Expiration Date: 202502142359
ISSUE DATE / TIME: 202502010512
ISSUE DATE / TIME: 202502010923
Unit Type and Rh: 6200
Unit Type and Rh: 6200

## 2023-04-06 NOTE — Progress Notes (Signed)
PROGRESS NOTE   Subjective/Complaints:  Pt doing better this morning, slept better. Pain controlled (tells me her R leg hurts, but doesn't want anything for it, nods her head when asked if it's manageable).  LBM yesterday? None documented since 1/30 so unclear when it was, pt nods yes when asked if she had one yesterday.  Urinating ok. Isn't sure if her menses has slowed any. Asked nursing staff to please document this.  Denies CP, SOB, abd pain, n/v/d, but still a limited historian due to aphasia  ROS: Limited by aphasia, +right sided weakness, improved appetite, +menses with heavy flow   Objective:   US PELVIS (TRANSABDOMINAL ONLY) Result Date: 04/05/2023 CLINICAL DATA:  Menorrhagia. EXAM: TRANSABDOMINAL ULTRASOUND OF PELVIS TECHNIQUE: Transabdominal ultrasound examination of the pelvis was performed including evaluation of the uterus, ovaries, adnexal regions, and pelvic cul-de-sac. Patient declined transvaginal imaging. COMPARISON:  None Available. FINDINGS: Uterus Measurements: 11.0 x 4.0 x 2.8 cm = volume: 64 mL. No fibroids or other mass visualized. Endometrium Thickness: 4 mm which is within normal limits. No focal abnormality visualized. Right ovary Measurements: 3.2 x 3.4 x 2.0 cm = volume: 11 mL. Normal appearance/no adnexal mass. Left ovary Measurements: 3.2 x 1.8 x 2.1 cm = volume: 6 mL. Normal appearance/no adnexal mass. Other findings:  No abnormal free fluid. IMPRESSION: No definite abnormality seen in the pelvis. Electronically Signed   By: Lupita Raider M.D.   On: 04/05/2023 17:36    Recent Labs    04/04/23 2216 04/05/23 1458  WBC 5.4 6.3  HGB 6.9* 8.5*  HCT 19.7* 24.6*  PLT 90* 83*    Recent Labs    04/04/23 2216  NA 137  K 3.3*  CL 107  CO2 20*  GLUCOSE 110*  BUN 13  CREATININE 0.88  CALCIUM 8.1*     Intake/Output Summary (Last 24 hours) at 04/06/2023 1134 Last data filed at 04/05/2023 1339 Gross  per 24 hour  Intake 576 ml  Output --  Net 576 ml         Physical Exam: Vital Signs Blood pressure (!) 88/46, pulse 83, temperature 97.6 F (36.4 C), temperature source Oral, resp. rate 16, height 5\' 4"  (1.626 m), weight 77.1 kg, SpO2 100%, unknown if currently breastfeeding.   General: NAD, laying in bed, doesn't want to be bothered but more agreeable than yesterday  HEENT: Head is normocephalic, atraumatic, mucous membranes moist Neck: Supple without JVD or lymphadenopathy Heart: Reg rate and rhythm. No murmurs rubs or gallops Chest: CTA bilaterally without wheezes, rales, or rhonchi; no distress Abdomen: Positive bowel sounds, abdomen is soft, no TTP anywhere today, nondistended, G tube c/d/I-- not uncovered today.  Urostomy tube in place--not uncovered today Extremities: No clubbing, cyanosis, or edema. Pulses are 2+ Psych: Appears frustrated and irritable but calmer than yesterday Skin: Clean and intact without signs of breakdown over exposed surfaces, Gtube C/D/I Neuro: expressive aphasia, improving  MsK: RUE/RLE flaccid, moves LUE/LLE antigravity  PRIOR EXAMS: Musculoskeletal:     Cervical back: Normal range of motion and neck supple.     Comments: LUE/LLE moving well again gravity RUE- no voluntary movement seen of RUE or RLE  Neurological:     Comments: Patient came back from working with therapy.  Awake and alert.  She does make eye contact with examiner.  Increased verbal output today. MAS of 3-4 in R shoulder esp External rotation; MAS of 4 in R elbow- lacking 45 degrees of extension with a lot of work; MAS of 2-3 of R hand- in fist at rest, but could completely open fingers and get past wrist neutral.  Difficult to assess sensation, stable 1/31    Assessment/Plan: 1. Functional deficits which require 3+ hours per day of interdisciplinary therapy in a comprehensive inpatient rehab setting. Physiatrist is providing close team supervision and 24 hour management  of active medical problems listed below. Physiatrist and rehab team continue to assess barriers to discharge/monitor patient progress toward functional and medical goals  Care Tool:  Bathing    Body parts bathed by patient: Right arm, Chest, Abdomen, Front perineal area, Buttocks, Right upper leg, Left upper leg, Left lower leg, Face   Body parts bathed by helper: Right lower leg, Left arm     Bathing assist Assist Level: Moderate Assistance - Patient 50 - 74%     Upper Body Dressing/Undressing Upper body dressing   What is the patient wearing?: Pull over shirt    Upper body assist Assist Level: Minimal Assistance - Patient > 75%    Lower Body Dressing/Undressing Lower body dressing      What is the patient wearing?: Incontinence brief     Lower body assist Assist for lower body dressing: Maximal Assistance - Patient 25 - 49%     Toileting Toileting    Toileting assist Assist for toileting: 2 Helpers     Transfers Chair/bed transfer  Transfers assist  Chair/bed transfer activity did not occur: Safety/medical concerns (unsafe to get up)  Chair/bed transfer assist level: Moderate Assistance - Patient 50 - 74%     Locomotion Ambulation   Ambulation assist      Assist level: 2 helpers Assistive device: Other (comment) (L HHA) Max distance: 33ft   Walk 10 feet activity   Assist     Assist level: 2 helpers Assistive device: Other (comment) (L HHA)   Walk 50 feet activity   Assist Walk 50 feet with 2 turns activity did not occur: Safety/medical concerns  Assist level: 2 helpers Assistive device: Other (comment) (L HHA)    Walk 150 feet activity   Assist Walk 150 feet activity did not occur: Safety/medical concerns         Walk 10 feet on uneven surface  activity   Assist Walk 10 feet on uneven surfaces activity did not occur: Safety/medical concerns         Wheelchair     Assist Is the patient using a wheelchair?: Yes Type  of Wheelchair: Manual Wheelchair activity did not occur: Safety/medical concerns         Wheelchair 50 feet with 2 turns activity    Assist    Wheelchair 50 feet with 2 turns activity did not occur: Safety/medical concerns       Wheelchair 150 feet activity     Assist  Wheelchair 150 feet activity did not occur: Safety/medical concerns       Blood pressure (!) 88/46, pulse 83, temperature 97.6 F (36.4 C), temperature source Oral, resp. rate 16, height 5\' 4"  (1.626 m), weight 77.1 kg, SpO2 100%, unknown if currently breastfeeding.   Medical Problem List and Plan: 1. Functional deficits secondary to left MCA territory infarction status  post unsuccessful IR revascularization/thrombectomy             -patient may  shower- cover PEG             -ELOS/Goals: 3-4 weeks mod to max A             -Continue CIR  Discussed prognosis for recovery Discussed family training on Monday and encouraged patient to maximize her participation with therapy, commended her on ambulating  2.  Antiphospholipid antibody syndrome: discussed that Eliquis 5mg  BID will help to reduce risk of clotting/strokes  -04/05/23 see #21 regarding anemia/drop in Hgb  3. Pain Management: Neurontin 100 mg 3 times daily, Topamax 25 mg twice daily, oxycodone 5 mg every 4 hours as needed -03/30/23 some tingling in R arm, positional? Manageable with meds. OT working on releasing scapula and helping with positioning; monitor -04/05/23 pain controlled, cont regimen  4. Mood/Behavior/Sleep: Klonopin 0.5 mg nightly, melatonin 5 mg nightly as needed -antipsychotic agents: suggest Depakote or increase in gabapentin to help calm pt down;  5. Neuropsych/cognition: This patient is not capable of making decisions on her own behalf. 6. Skin/Wound Care: routine skin care for PEG and overall skin 7. Fluids/Electrolytes/Nutrition: will assess labs, monitor I&Os, cont supplements -03/29/23 K a little low 3.4, see #18; Na improved.  BMP otherwise fairly unremarkable.  -03/30/23 K 4.0 today, BMP otherwise ok today, monitor -04/05/23 K 3.3, will give today, monitor labs Monday  8.  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  Seen by oncology/hematology once in 2024-- needs to f/up  9.  Thrombocytopenia/iron deficiency/folic acid deficiency/B12 deficiency.  Follow-up CBC.  Continue Niferex 150mg  daily, and supplements  -04/05/23 Plt 90, stable, monitor   10.  Dysphagia.  Status post gastrostomy tube 03/21/2023 per interventional radiology.  Diet has been advanced to regular.  D/c TF. Discussed G-tube can be removed after 6-8 weeks from placement date  11.  E. coli UTI.  Antibiotic therapy completed 12.  Tobacco/alcohol use.  Counseling 13. Severe RUE spasticity- would strongly suggest Botox of RUE soon as well as resting hand splint for R wrist and R PRAFO- will order.   14. Abd pain- from PEG vs constipation- if no BM, might need KUB.  -IR consult to assess Gtube site.  Pus noted at skin site. Appears CT scan is ordered -Likely G-tube infection.  She is going for a IR procedure today.  Hospitalist team following-appreciate assistance.  She has been started on vancomycin and ceftriaxone for abdominal infection.  Echocardiogram and cultures have been ordered.  Spoke with hospitalist today, ID has been consulted. -1/24 ID recommending vancomycin for 7 days total and can use linezolid if discharged prior to this -03/29/23 appreciate hospitalist help, repeat BCx neg so far, ID signed off; looks like abd xray ordered, pending. Advancing diet as above.  -03/30/23 no abd pain today, other than menstrual cramping, has tylenol if needed; abd xray yesterday fine. Hospitalist still following, no note yet today. Appreciate their assistance.  -04/05/23 still with abd cramping-- no  significant tenderness; getting TVUS as below; monitor -04/06/23 no abd pain today, but LBM 04/03/23, cont miralax/colace BID but may need further intervention if no BM by tomorrow   15.  Elevated transaminases-mild, improved  Kpad ordered to minimize tylenol use  16.  Hyponatremia: resolved, monitor Na weekly. Improved 04/05/23  17.  History of ileus: d/c feeding supplements  and continue regular diet-- see #14 regarding constipation  18. Hypokalemia: klor 40 ordered 1/27, monitor potassium weekly-- see #7  19. Right sided wrist drop: wrist cock-up splint ordered, continue prn  20. Right sided ankle instability: aircast ordered, discussed with PT that it has been received, continue prn  21. Anemia/menorrhagia: discussed stool occult is positive, repeat ordered for today, CBC for today has not yet been drawn, chart reviewed and could be secondary to menses with started 1/31 -04/05/23 pt with dropping hgb the entire week, heavy menses all week, Hgb down to 6.9 overnight with associated hypotension/tachycardia, got 2U PRBCs. VS improving/stable. Spoke with mom and updated her. Pt understands, too.  -Spoke with OBGYN on call Dr. Nobie Putnam who requested we get a TVUS and start Megace 40mg  BID, continue Eliquis, they will f/up with TVUS once it's done, may need to increase Megace to 80mg  if heavy bleeding persists but for now start with 40mg  BID; monitor closely. F/up CBC after transfusion -04/06/23 post transfusion Hgb 8.5, recheck tomorrow morning; asked that RNs document regarding menstrual flow and clots, to monitor how Megace is helping; must monitor closely; pelvic US done transabdominally only due to pt refusal, no abnormalities seen. Reach out to OBGYN if further assistance is needed  I spent >63mins performing patient care related activities, including face to face time, documentation time, management and review of post-transfusion labs and discussion with nursing regarding menses, and overall  coordination of care.       LOS: 12 days A FACE TO FACE EVALUATION WAS PERFORMED  [id number] 04/06/2023, 11:34 AM

## 2023-04-06 NOTE — Consult Note (Signed)
  04/04/2023 10 AM-11 AM:  Today I attempted to visit with the patient and while she was in the room she was asleep when I entered.  The patient was avoidant of the event raising her head off the pillow although she was clearly awake after introducing myself.  While the patient at that recent significant stroke that affected expressive language capacity I reviewed her interactions with various times with therapy or other providers and it sounds like she had a wide range of behavioral responses with individuals.  I was able to get very little interaction or response with the patient today.  Multiple efforts were made to engage with the patient.

## 2023-04-06 NOTE — Plan of Care (Signed)
  Problem: Consults Goal: RH STROKE PATIENT EDUCATION Description: See Patient Education module for education specifics  Outcome: Progressing   Problem: RH BOWEL ELIMINATION Goal: RH STG MANAGE BOWEL WITH ASSISTANCE Description: STG Manage Bowel with medications with minimal Assistance. Outcome: Progressing   Problem: RH BLADDER ELIMINATION Goal: RH STG MANAGE BLADDER WITH ASSISTANCE Description: STG Manage Bladder with minimal Assistance Outcome: Progressing   Problem: RH SAFETY Goal: RH STG ADHERE TO SAFETY PRECAUTIONS W/ASSISTANCE/DEVICE Description: STG Adhere to Safety Precautions with min A Outcome: Progressing   Problem: RH PAIN MANAGEMENT Goal: RH STG PAIN MANAGED AT OR BELOW PT'S PAIN GOAL Description: <4w/ prns Outcome: Progressing   Problem: RH KNOWLEDGE DEFICIT Goal: RH STG INCREASE KNOWLEDGE OF STROKE PROPHYLAXIS Description: Manage stroke prophylaxis with min A Outcome: Progressing   Problem: RH BOWEL ELIMINATION Goal: RH STG MANAGE BOWEL W/MEDICATION W/ASSISTANCE Description: STG Manage Bowel with Medication with Assistance. Outcome: Progressing

## 2023-04-07 LAB — BASIC METABOLIC PANEL
Anion gap: 7 (ref 5–15)
BUN: 9 mg/dL (ref 6–20)
CO2: 18 mmol/L — ABNORMAL LOW (ref 22–32)
Calcium: 8.1 mg/dL — ABNORMAL LOW (ref 8.9–10.3)
Chloride: 114 mmol/L — ABNORMAL HIGH (ref 98–111)
Creatinine, Ser: 0.88 mg/dL (ref 0.44–1.00)
GFR, Estimated: >60 mL/min (ref >60)
Glucose, Bld: 89 mg/dL (ref 70–99)
Potassium: 3.2 mmol/L — ABNORMAL LOW (ref 3.5–5.1)
Sodium: 139 mmol/L (ref 135–145)

## 2023-04-07 LAB — CBC
HCT: 22.3 % — ABNORMAL LOW (ref 36.0–46.0)
Hemoglobin: 7.8 g/dL — ABNORMAL LOW (ref 12.0–15.0)
MCH: 28.7 pg (ref 26.0–34.0)
MCHC: 35 g/dL (ref 30.0–36.0)
MCV: 82 fL (ref 80.0–100.0)
Platelets: 81 10*3/uL — ABNORMAL LOW (ref 150–400)
RBC: 2.72 MIL/uL — ABNORMAL LOW (ref 3.87–5.11)
RDW: 18.7 % — ABNORMAL HIGH (ref 11.5–15.5)
WBC: 5.1 10*3/uL (ref 4.0–10.5)
nRBC: 0 % (ref 0.0–0.2)

## 2023-04-07 MED ORDER — SODIUM CHLORIDE 0.9 % IV SOLN: INTRAVENOUS | Status: AC

## 2023-04-07 MED ORDER — POTASSIUM CHLORIDE CRYS ER 20 MEQ PO TBCR
40.0000 meq | EXTENDED_RELEASE_TABLET | Freq: Once | ORAL | Status: AC
  Administered 2023-04-07: 40 meq via ORAL
  Filled 2023-04-07: qty 2

## 2023-04-07 MED ORDER — POTASSIUM CHLORIDE 20 MEQ PO PACK: 40.0000 meq | PACK | Freq: Once | ORAL | Status: DC

## 2023-04-07 NOTE — Progress Notes (Signed)
   04/07/23 1232  Spiritual Encounters  Type of Visit Follow up  Care provided to: Pt and family  Reason for visit Advance directives  OnCall Visit No   Discussed advance directive with patient and sister. Patient and sister are inquiring about an POA not HCPOA.  Patient's mother has POA however patient wants it transferred to sister. Explained that we do not process POA, that is filed through the courts. Sister wants POA as she has the patient's children. Explained the difference between POA and HCPOA to sister and patient. Left blank HCPOA forms for sister to review.

## 2023-04-07 NOTE — Progress Notes (Signed)
Speech Language Pathology Daily Session Note  Patient Details  Name: Jacqueline [name] MRN: [id number] Date of Birth: September 21, 2000  Today's Date: 04/07/2023 SLP Individual Time: 0900-0931 SLP Individual Time Calculation (min): 31 min  Short Term Goals: Week 2: SLP Short Term Goal 1 (Week 2): Patient will follow one step directions with 50% acc given max multimodal cues SLP Short Term Goal 2 (Week 2): Patient will answer simple yes/ no questions with 60% acc given max multimodal cues SLP Short Term Goal 3 (Week 2): Patient will repeat single words with 60% acc with min multimodal cues SLP Short Term Goal 4 (Week 2): Patient will complete automatic speech task with 50% acc given max multimodal cues  Skilled Therapeutic Interventions:  Patient was seen in am to address expressive and receptive language. Pt was alert and seen at bedside upon SLP arrival. This SLP was scheduled for a family education session however, no family in the room. Pt's demeanor toward SLP noted to be different from previous week. When asked, pt became emotional and appeared to talk about frustration with her mother and BF which was confirmed with additional prompting. Pt largely speaking in jargon with a few spontaneous words and phrases occurring. Notable phonemic paraphasis and neologisms persisted with pt largely unaware of errors this date as indicated by playing this SLP a voice message she sent to her boyfriend with single words often paraphasias. Difficulty redirecting pt to functional and structured task due to extreme emotion including moments of crying. Spontaneous speech observed this session included, "It's nasty, whatever, what you talking about, I'm tired, and some curse words. SLP provided active listening and encouragement as needed while attempting to shape pt's speech. She answered simple yes/ no questions with 75% acc with min A. She repeated words where phonemic paraphasia was observed with 50% acc and max A. At  conclusion of session, pt was left upright in bed with call button within reach. SLP to continue POC.  Pain Pain Assessment Pain Scale: 0-10 Pain Score: 0-No pain  Therapy/Group: Individual Therapy  Renaee Munda 04/07/2023, 12:20 PM

## 2023-04-07 NOTE — Progress Notes (Signed)
Physical Therapy Session Note  Patient Details  Name: Jacqueline [name] MRN: [id number] Date of Birth: [birth year]  Today's Date: 04/07/2023 PT Individual Time: 1106-1205 PT Individual Time Calculation (min): 59 min   Short Term Goals: Week 2:  PT Short Term Goal 1 (Week 2): pt will perform all aspects of bed mobility with mod A consistantly PT Short Term Goal 2 (Week 2): pt will transfer bed<>chair with mod A of 1 consistantly PT Short Term Goal 3 (Week 2): pt will ambulate 51ft with LRAD and skilled max A of 1  Skilled Therapeutic Interventions/Progress Updates:    Pt presents in room seated in recliner speaking with boyfriend, [name] on the phone who remains on the phone for the entirety of the session. Pt denies pain, agreeable to PT. Pt requesting to doff aircast and don shoes for session. Therapist inquires with pt boyfriend about bringing different shoes however pt demonstrating increased agitation and refusing bigger shoes. Session focused on therapeutic activities for transfer training and education as well as gait training for tolerance to upright as well as RLE muscle fiber recruitment.  Therapist doffs pt aircast and nonslip socks total assist, and dons socks and shoes with total assist. Pt completes sit<>stand in stedy with supervsion with LUE support on pull bar, pt transferred via stedy to WC. Pt tranpsorted via WC to hallway outside of main gym for gait training.  Pt trial gait training with eva walker, pt able to maintain RUE on platform and grip on handle throughout gait training, demonstrates difficulty maintaining gait speed and anterior/posterior stability with gait with eva walker however is able to advance RLE minimally. Pt with increased R knee hyperextension with stance phase without tactile cues.  Pt then ambulates 180' with 2nd person providing min/mod assist for postural stability with L HHA, therapist providing skilled tactile and verbal cueing as well as mod assist for  manual facilitation of RLE advance with shoe cover donned. Pt demonstrates improving R knee control with stance phase with only occasional instance of R knee hyperextension in stance, no instance of buckling. Pt requires increased time to complete. Pt completes 180* turn to R with min assist for stability with pivoting on R foot, requires mod assist x2 for 180* turn to L to sit in WC.  Pt returned to room, completes stand pivot transfer with R with mod assist for postural stability in standing. Pt requires mod assist for sit to supine for RLE into bed. Pt maintains supine with HOB elevated, all needs within reach, call light in place, and bed alarm activated at end of session.  Pt   Therapy Documentation Precautions:  Precautions Precautions: Fall Precaution Comments: PEG, expressive aphasia with agitation, Rt hemi Restrictions Weight Bearing Restrictions Per Provider Order: No   Therapy/Group: Individual Therapy  Edwin Cap PT, DPT 04/07/2023, 12:24 PM

## 2023-04-07 NOTE — Progress Notes (Signed)
   04/07/23 1130  Spiritual Encounters  Type of Visit Attempt (pt unavailable)  Conversation partners present during encounter Nurse  Reason for visit Advance directives  OnCall Visit No   Responded to patient's request for HCPOA, however patient was in the process of physical therapy. Will refer to another chaplain to follow-up.

## 2023-04-07 NOTE — Progress Notes (Addendum)
Patient ID: Jacqueline [name], female   DOB: [birth year], 23 y.o.   MRN: [id number]  Attempted to call Mom since not here for education. No answer will continue to try and hopefully she is on her way  10:47 AM Spoke with Ashly Yepez [phone] oldest sister to discuss her concerns. She feels her Mom told her she made up the threat issue and this is what made her confidential. Jacqueline [name]'s sister has her children not her Mom according to what Mom told this worker. Jacqueline [name] and Jacqueline [name] would like for her to be her HCPOA and MD will change her note to say Jacqueline [name] is capable of making her own decisions while here. This will allow chaplain to pursue HCPOA. Jacqueline [name] does not want to go home with her Mom and wants to go home with [name] but will need care while he is working. Do have sister number now and will communicate with her along with Mom if she were to answer the phone. Will try to see where Jacqueline [name] is going to at discharge so can make PCP referral.

## 2023-04-07 NOTE — Progress Notes (Addendum)
PROGRESS NOTE   Subjective/Complaints: C/o fatigue, discussed heavy menstrual flow can cause this Spoke with sister on the phone and both patient and sister would like for her to be the POA  ROS: Limited by aphasia, +right sided weakness, improved appetite, +menses with heavy flow   Objective:   US PELVIS (TRANSABDOMINAL ONLY) Result Date: 04/05/2023 CLINICAL DATA:  Menorrhagia. EXAM: TRANSABDOMINAL ULTRASOUND OF PELVIS TECHNIQUE: Transabdominal ultrasound examination of the pelvis was performed including evaluation of the uterus, ovaries, adnexal regions, and pelvic cul-de-sac. Patient declined transvaginal imaging. COMPARISON:  None Available. FINDINGS: Uterus Measurements: 11.0 x 4.0 x 2.8 cm = volume: 64 mL. No fibroids or other mass visualized. Endometrium Thickness: 4 mm which is within normal limits. No focal abnormality visualized. Right ovary Measurements: 3.2 x 3.4 x 2.0 cm = volume: 11 mL. Normal appearance/no adnexal mass. Left ovary Measurements: 3.2 x 1.8 x 2.1 cm = volume: 6 mL. Normal appearance/no adnexal mass. Other findings:  No abnormal free fluid. IMPRESSION: No definite abnormality seen in the pelvis. Electronically Signed   By: Lupita Raider M.D.   On: 04/05/2023 17:36    Recent Labs    04/05/23 1458 04/07/23 0537  WBC 6.3 5.1  HGB 8.5* 7.8*  HCT 24.6* 22.3*  PLT 83* 81*    Recent Labs    04/04/23 2216 04/07/23 0537  NA 137 139  K 3.3* 3.2*  CL 107 114*  CO2 20* 18*  GLUCOSE 110* 89  BUN 13 9  CREATININE 0.88 0.88  CALCIUM 8.1* 8.1*     Intake/Output Summary (Last 24 hours) at 04/07/2023 1007 Last data filed at 04/06/2023 1802 Gross per 24 hour  Intake 437 ml  Output --  Net 437 ml         Physical Exam: Vital Signs Blood pressure (!) 91/54, pulse 71, temperature 98.6 F (37 C), temperature source Oral, resp. rate 16, height 5\' 4"  (1.626 m), weight 77.1 kg, SpO2 100%, unknown if  currently breastfeeding.   General: NAD, laying in bed, doesn't want to be bothered but more agreeable than yesterday  HEENT: Head is normocephalic, atraumatic, mucous membranes moist Neck: Supple without JVD or lymphadenopathy Heart: Reg rate and rhythm. No murmurs rubs or gallops Chest: CTA bilaterally without wheezes, rales, or rhonchi; no distress Abdomen: Positive bowel sounds, abdomen is soft, no TTP anywhere today, nondistended, G tube c/d/I-- not uncovered today.  Urostomy tube in place--not uncovered today Extremities: No clubbing, cyanosis, or edema. Pulses are 2+ Psych: Appears frustrated and irritable but calmer than yesterday Skin: Clean and intact without signs of breakdown over exposed surfaces, Gtube C/D/I Neuro: expressive aphasia, improving  MsK: RUE/RLE flaccid, moves LUE/LLE antigravity Musculoskeletal:     Cervical back: Normal range of motion and neck supple.     Comments: LUE/LLE moving well again gravity RUE- no voluntary movement seen of RUE or RLE   Neurological:     Comments: Patient came back from working with therapy.  Awake and alert.  She does make eye contact with examiner.  Increased verbal output today. MAS of 3-4 in R shoulder esp External rotation; MAS of 4 in R elbow- lacking 45 degrees of extension  with a lot of work; MAS of 2-3 of R hand- in fist at rest, but could completely open fingers and get past wrist neutral.  Difficult to assess sensation, stable 2/3    Assessment/Plan: 1. Functional deficits which require 3+ hours per day of interdisciplinary therapy in a comprehensive inpatient rehab setting. Physiatrist is providing close team supervision and 24 hour management of active medical problems listed below. Physiatrist and rehab team continue to assess barriers to discharge/monitor patient progress toward functional and medical goals  Care Tool:  Bathing    Body parts bathed by patient: Right arm, Chest, Abdomen, Front perineal area,  Buttocks, Right upper leg, Left upper leg, Left lower leg, Face   Body parts bathed by helper: Right lower leg, Left arm     Bathing assist Assist Level: Moderate Assistance - Patient 50 - 74%     Upper Body Dressing/Undressing Upper body dressing   What is the patient wearing?: Pull over shirt    Upper body assist Assist Level: Minimal Assistance - Patient > 75%    Lower Body Dressing/Undressing Lower body dressing      What is the patient wearing?: Incontinence brief     Lower body assist Assist for lower body dressing: Maximal Assistance - Patient 25 - 49%     Toileting Toileting    Toileting assist Assist for toileting: 2 Helpers     Transfers Chair/bed transfer  Transfers assist  Chair/bed transfer activity did not occur: Safety/medical concerns (unsafe to get up)  Chair/bed transfer assist level: Moderate Assistance - Patient 50 - 74%     Locomotion Ambulation   Ambulation assist      Assist level: 2 helpers Assistive device: Other (comment) (L HHA) Max distance: 82ft   Walk 10 feet activity   Assist     Assist level: 2 helpers Assistive device: Other (comment) (L HHA)   Walk 50 feet activity   Assist Walk 50 feet with 2 turns activity did not occur: Safety/medical concerns  Assist level: 2 helpers Assistive device: Other (comment) (L HHA)    Walk 150 feet activity   Assist Walk 150 feet activity did not occur: Safety/medical concerns         Walk 10 feet on uneven surface  activity   Assist Walk 10 feet on uneven surfaces activity did not occur: Safety/medical concerns         Wheelchair     Assist Is the patient using a wheelchair?: Yes Type of Wheelchair: Manual Wheelchair activity did not occur: Safety/medical concerns         Wheelchair 50 feet with 2 turns activity    Assist    Wheelchair 50 feet with 2 turns activity did not occur: Safety/medical concerns       Wheelchair 150 feet activity      Assist  Wheelchair 150 feet activity did not occur: Safety/medical concerns       Blood pressure (!) 91/54, pulse 71, temperature 98.6 F (37 C), temperature source Oral, resp. rate 16, height 5\' 4"  (1.626 m), weight 77.1 kg, SpO2 100%, unknown if currently breastfeeding.   Medical Problem List and Plan: 1. 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  Antiphospholipid antibody syndrome: discussed that Eliquis 5mg  BID will help to reduce risk of clotting/strokes  Discussed that Eliquis will increase her menstrual bleeds  3. Pain Management: Neurontin 100 mg 3 times daily, Topamax 25 mg twice daily, oxycodone 5 mg every 4 hours as needed -03/30/23 some tingling in R arm, positional? Manageable with meds. OT working on releasing scapula and helping with positioning; monitor -04/05/23 pain controlled, cont regimen  4. Mood/Behavior/Sleep: Klonopin 0.5 mg nightly, melatonin 5 mg nightly as needed -antipsychotic agents: suggest Depakote or increase in gabapentin to help calm pt down;  5. Neuropsych/cognition: This patient is capable of making decisions on her own behalf. 6. Skin/Wound Care: routine skin care for PEG and overall skin 7. Fluids/Electrolytes/Nutrition: will assess labs, monitor I&Os, cont supplements -03/29/23 K a little low 3.4, see #18; Na improved. BMP otherwise fairly unremarkable.  -03/30/23 K 4.0 today, BMP otherwise ok today, monitor -04/05/23 K 3.3, will give today, monitor labs Monday  8.  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  Seen by oncology/hematology once in 2024-- needs to f/up  9.  Thrombocytopenia/iron deficiency/folic acid deficiency/B12 deficiency.  Follow-up CBC.  Continue Niferex 150mg  daily, and supplements  -04/05/23 Plt 90, stable, monitor   10.  Dysphagia.  Status post gastrostomy tube 03/21/2023 per interventional radiology.  Diet has been advanced to regular.  D/c TF. Discussed G-tube can be removed after 6-8 weeks from placement date  11.  E. coli UTI.  Antibiotic therapy completed 12.  Tobacco/alcohol use.  Counseling 13. Severe RUE spasticity- would strongly suggest Botox of RUE soon as well as resting hand splint for R wrist and R PRAFO- will order.   14. Abd pain- from PEG vs constipation- if no BM, might need KUB.  -IR consult to assess Gtube site.  Pus noted at skin site. Appears CT scan is ordered -Likely G-tube infection.  She is going for a IR procedure today.  Hospitalist team following-appreciate assistance.  She has been started on vancomycin and ceftriaxone for abdominal infection.  Echocardiogram and cultures have been ordered.  Spoke with hospitalist today, ID has been consulted. -1/24 ID recommending vancomycin for 7 days total and can use linezolid if discharged prior to this -03/29/23 appreciate hospitalist help, repeat BCx neg so far, ID signed off; looks like abd xray ordered, pending. Advancing diet as above.  -03/30/23 no abd pain today, other than menstrual cramping, has tylenol if needed; abd xray yesterday fine. Hospitalist still following, no note yet today. Appreciate their assistance.  -04/05/23 still with abd cramping-- no significant tenderness; getting TVUS as below; monitor -04/06/23 no abd pain today, but LBM 04/03/23, cont miralax/colace BID but may need further intervention if no BM by tomorrow   15.  Elevated transaminases-mild,  improved  Kpad ordered to minimize tylenol use  16.  Hyponatremia: resolved, monitor Na weekly. Improved 04/05/23  17.  History of ileus: d/c feeding supplements and continue regular diet-- see #14 regarding constipation  18. Hypokalemia: klor 40 ordered 1/27, monitor potassium weekly-- see #7  19. Right sided wrist drop: wrist cock-up splint ordered, continue prn  20. Right sided ankle instability: aircast ordered, discussed with PT that  it has been received, continue prn  21. Anemia/menorrhagia: discussed stool occult is positive, repeat ordered for today, CBC for today has not yet been drawn, chart reviewed and could be secondary to menses with started 1/31 -04/05/23 pt with dropping hgb the entire week, heavy menses all week, Hgb down to 6.9 overnight with associated hypotension/tachycardia, got 2U PRBCs. VS improving/stable. Spoke with mom and updated her. Pt understands, too.  -Spoke with OBGYN on call Dr. Nobie Putnam who requested we get a TVUS and start Megace 40mg  BID, continue Eliquis, they will f/up with TVUS once it's done, may need to increase Megace to 80mg  if heavy bleeding persists but for now start with 40mg  BID; monitor closely. F/up CBC after transfusion -04/06/23 post transfusion Hgb 8.5, recheck tomorrow morning; asked that RNs document regarding menstrual flow and clots, to monitor how Megace is helping; must monitor closely; pelvic US done transabdominally only due to pt refusal, no abnormalities seen. Reach out to OBGYN if further assistance is needed  2/3: hgb reviewed and decreasing to 7.8, discussed with patient   22/ Hyperchloremia: may be 2/2 to dehydration from heavy menstrual bleeds, will start IVF  23. Hypotension: will start IVF      LOS: 13 days A FACE TO FACE EVALUATION WAS PERFORMED  Lindia Garms P Eliese Kerwood 04/07/2023, 10:07 AM

## 2023-04-07 NOTE — Plan of Care (Signed)
Pt is alert and oriented x 4. Cooperative and calm during overnight. Received 1 dose of oxy. 1 dose of tylenol. With 1 assist and stedy pt going to Bathroom. Brace on right arm/wrist. Expressive aphasia remains barrier to communication.  Problem: Consults Goal: RH STROKE PATIENT EDUCATION Description: See Patient Education module for education specifics  Outcome: Progressing   Problem: RH BOWEL ELIMINATION Goal: RH STG MANAGE BOWEL WITH ASSISTANCE Description: STG Manage Bowel with medications with minimal Assistance. Outcome: Progressing   Problem: RH BLADDER ELIMINATION Goal: RH STG MANAGE BLADDER WITH ASSISTANCE Description: STG Manage Bladder with minimal Assistance Outcome: Progressing   Problem: RH SAFETY Goal: RH STG ADHERE TO SAFETY PRECAUTIONS W/ASSISTANCE/DEVICE Description: STG Adhere to Safety Precautions with min A Outcome: Progressing   Problem: RH PAIN MANAGEMENT Goal: RH STG PAIN MANAGED AT OR BELOW PT'S PAIN GOAL Description: <4w/ prns Outcome: Progressing   Problem: RH KNOWLEDGE DEFICIT Goal: RH STG INCREASE KNOWLEDGE OF STROKE PROPHYLAXIS Description: Manage stroke prophylaxis with min A Outcome: Progressing   Problem: RH BOWEL ELIMINATION Goal: RH STG MANAGE BOWEL W/MEDICATION W/ASSISTANCE Description: STG Manage Bowel with Medication with Assistance. Outcome: Progressing

## 2023-04-07 NOTE — Progress Notes (Signed)
Occupational Therapy Session Note  Patient Details  Name: Jacqueline [name] MRN: [id number] Date of Birth: 02/23/2001  Today's Date: 04/07/2023 OT Individual Time: 7829-5621 & 1350-1430 OT Individual Time Calculation (min): 58 min & 40 min   Short Term Goals: Week 2:  OT Short Term Goal 1 (Week 2): Pt will keep RUE in safe space during functional mobility with min questioning cues OT Short Term Goal 2 (Week 2): Pt will complete 2/3 toileting steps with Min A for standing balance OT Short Term Goal 3 (Week 2): Pt will donn UB clothing with mod A using hemi techniques OT Short Term Goal 4 (Week 2): Pt will donn RUE resting hand splint with min cues  Skilled Therapeutic Interventions/Progress Updates:  Session 1 Skilled OT intervention completed with focus on DC planning and ADL retraining. Pt received upright in bed, appeared flat and frustrated. Pt agreeable to session. RLE pain reported; pre-medicated. OT offered rest breaks, repositioning throughout for pain reduction.  Pt remained expressively aphasic, however with adequate ability to convey the following desires/information with time and yes/no responses: -pt is frustrated about her mom's lack of attendance for family ed (which was arranged for this session but she did not show as well as for SLP session) -pt's desire to DC to Omar's home (boyfriend) vs her mothers as pt feels she is unreliable and unable to care for her (appears there is a dynamic with their relationship -after sharing that her mother is HCPOA, and what this means, pt made explicit requests to change her mother's position in her medical decisions, desiring her sister or [name] to take over -CSW/MD notified of status and pt request, with CSW present and OT assisted with communication between the two to determine that pt's daughters are actually with her oldest sister, not her mother as previously shared by her mother to CSW; sister on facetime confirming that the mother is  unreliable, and we all discussed need for the sister to attend education if assisting at DC, and especially if pt is able to get POA switched -pt very clear on her goals to DC in order to care for her kids, and to get better for herself with the understanding that it will take time; discussed how this is in improvement to her outlook and awareness as just last week she was very argumentative about therapy participation with this OT and others -pt appreciative of time taken for the above information and subsequently was very receptive and motivated during session  Transitioned to EOB with min A for RLE only however cues needed to scoot forward for BLE placement on floor. Threaded pants, RLE air cast and BLE grip socks (tennis shoes too small and pt reports discomfort at toes when donned) with total A for time. Min A sit > stand with Rt knee guard, then pt donned pants over hips with assist only provided for Rt side. Mod A stand pivot > w/c with cues for stepping with Lt and extending RLE weight acceptance.   Seated at sink, pt was educated on hemi technique using hole in sink for brushing teeth, in which pt was able to complete oral care with only supervision this way. Retrieved q-tips with pt able to manage ear hygiene with set up A. Pt verbalized "need to pee." Utilized stedy for time, completed min A sit > stand in stedy, then dependent transfer > BSC over toilet. CGA sit > stand, max A for doffing LB clothing. Brief noted to be soiled with menstrual blood.  Direct care hand off to nursing due to OT time constraint. All immediate needs met.  Session 2 Skilled OT intervention completed with focus on functional transfers, R NMR. Pt received upright in bed in good spirits, agreeable to session. Rt wrist pain reported with weight bearing; declined medication. OT offered repositioning throughout for pain reduction.  Pt very giggly and engaged this session. Nurse present to disconnect IV for therapy.  Transitioned to EOB with supervision!! Completed mod A squat pivot > EOB > EOM in gym with cues for sequencing.  Seated EOM, pt doffed RUE wrist cock up splint with mod A then applied saebo to Rt wrist extensors during the following activities to promote NMR: -picking up, reaching, and dropping ball to large target from Rt hand with assist needed for RUE movement due to flaccidity but did notice trace activation during scapular protraction -WB on RUE with stability needed at wrist and elbow during Rt sided lean for using LUE to toss into large moving target then upgraded small stationary target  Mod A squat pivot > EOM > EOB. Pt declined wearing saebo unattended, therefore OT doffed dependently. Skin intact after removal. Pt stood with mod A for doffing of pants at max A level. Doffed/donned new gown with max A due OT time constraint. Redonned wrist cock up splint dependently. Pt remained upright in bed, with bed alarm on/activated, and with all needs in reach at end of session.   Therapy Documentation Precautions:  Precautions Precautions: Fall Precaution Comments: PEG, expressive aphasia with agitation, Rt hemi Restrictions Weight Bearing Restrictions Per Provider Order: No    Therapy/Group: Individual Therapy  Melvyn Novas, MS, OTR/L  04/07/2023, 2:55 PM

## 2023-04-07 NOTE — Progress Notes (Signed)
   04/07/23 1300  Spiritual Encounters  Type of Visit Follow up  Care provided to: Patient  Reason for visit Advance directives  OnCall Visit No   Chaplain left blank HCPOA to the patient upon their request.  M.Kubra Delano Metz Resident [phone]

## 2023-04-08 LAB — CBC WITH DIFFERENTIAL/PLATELET
Abs Immature Granulocytes: 0.02 10*3/uL (ref 0.00–0.07)
Basophils Absolute: 0 10*3/uL (ref 0.0–0.1)
Basophils Relative: 0 %
Eosinophils Absolute: 0.1 10*3/uL (ref 0.0–0.5)
Eosinophils Relative: 1 %
HCT: 24 % — ABNORMAL LOW (ref 36.0–46.0)
Hemoglobin: 8.3 g/dL — ABNORMAL LOW (ref 12.0–15.0)
Immature Granulocytes: 0 %
Lymphocytes Relative: 27 %
Lymphs Abs: 1.6 10*3/uL (ref 0.7–4.0)
MCH: 28.6 pg (ref 26.0–34.0)
MCHC: 34.6 g/dL (ref 30.0–36.0)
MCV: 82.8 fL (ref 80.0–100.0)
Monocytes Absolute: 0.4 10*3/uL (ref 0.1–1.0)
Monocytes Relative: 6 %
Neutro Abs: 4 10*3/uL (ref 1.7–7.7)
Neutrophils Relative %: 66 %
Platelets: 103 10*3/uL — ABNORMAL LOW (ref 150–400)
RBC: 2.9 MIL/uL — ABNORMAL LOW (ref 3.87–5.11)
RDW: 19.1 % — ABNORMAL HIGH (ref 11.5–15.5)
WBC: 6.1 10*3/uL (ref 4.0–10.5)
nRBC: 0 % (ref 0.0–0.2)

## 2023-04-08 LAB — BASIC METABOLIC PANEL
Anion gap: 7 (ref 5–15)
BUN: 9 mg/dL (ref 6–20)
CO2: 19 mmol/L — ABNORMAL LOW (ref 22–32)
Calcium: 8.8 mg/dL — ABNORMAL LOW (ref 8.9–10.3)
Chloride: 112 mmol/L — ABNORMAL HIGH (ref 98–111)
Creatinine, Ser: 0.95 mg/dL (ref 0.44–1.00)
GFR, Estimated: >60 mL/min (ref >60)
Glucose, Bld: 88 mg/dL (ref 70–99)
Potassium: 3.7 mmol/L (ref 3.5–5.1)
Sodium: 138 mmol/L (ref 135–145)

## 2023-04-08 NOTE — Plan of Care (Signed)
  Problem: RH BOWEL ELIMINATION Goal: RH STG MANAGE BOWEL WITH ASSISTANCE Description: STG Manage Bowel with medications with minimal Assistance. Outcome: Progressing   Problem: RH BLADDER ELIMINATION Goal: RH STG MANAGE BLADDER WITH ASSISTANCE Description: STG Manage Bladder with minimal Assistance Outcome: Progressing   Problem: RH SAFETY Goal: RH STG ADHERE TO SAFETY PRECAUTIONS W/ASSISTANCE/DEVICE Description: STG Adhere to Safety Precautions with min A Outcome: Progressing   Problem: RH PAIN MANAGEMENT Goal: RH STG PAIN MANAGED AT OR BELOW PT'S PAIN GOAL Description: <4w/ prns Outcome: Progressing

## 2023-04-08 NOTE — Progress Notes (Signed)
 Physical Therapy Session Note  Patient Details  Name: Jacqueline [name] MRN: [id number] Date of Birth: November 11, 2000  Today's Date: 04/08/2023 PT Individual Time: 1001-1056  PT Individual Time Calculation (min): 55 min PT Missed Time: 45 minutes PT Missed Time Reason: Refusal   Short Term Goals: Week 1:  PT Short Term Goal 1 (Week 1): pt will transfer supine<>sitting EOB with mod A of 1 PT Short Term Goal 1 - Progress (Week 1): Met PT Short Term Goal 2 (Week 1): pt will transfer sit<>stand with LRAD and mod A of 1 PT Short Term Goal 2 - Progress (Week 1): Met PT Short Term Goal 3 (Week 1): pt will ambulate 28ft with LRAD and max A of 1 PT Short Term Goal 3 - Progress (Week 1): Progressing toward goal Week 2:  PT Short Term Goal 1 (Week 2): pt will perform all aspects of bed mobility with mod A consistantly PT Short Term Goal 2 (Week 2): pt will transfer bed<>chair with mod A of 1 consistantly PT Short Term Goal 3 (Week 2): pt will ambulate 41ft with LRAD and skilled max A of 1  Skilled Therapeutic Interventions/Progress Updates:   Treatment Session 1 Received pt sitting in WC, pt agreeable to PT treatment, and did not c/o pain during session. Session with emphasis on functional mobility/transfers, generalized strengthening and endurance, dynamic standing balance/coordination, and gait training. Donned R shoe dependently and transported to dayroom in [facility] dependently for time management purposes.   Donned shoe cover to R foot and LiteGait harness seated with max A. Stood with mod A and required +2 assist to finish fastening harness. Stepped on/off LteGait Treadmill with +2 assist (max A for RLE management). Secured RUE to handgrip using ace wrap for support (but later removed per pt request) and pt ambulated 31ft in 1 minute and 3 seconds at 0. with max/total A to advance/place R foot, assist to prevent ankle IR/supination, and assist at pelvis for weight shifting L to advance R foot. Pt  unreceptive to multimodal cues and demanding to stop but unable to elaborate why other than not liking it. Backed off treadmill with +2 assist, took 5 steps backwards with max A to manage/block RLE with +2 steering LiteGait, then ambulated additional 33ft in LiteGait overground due to pt's refusal to walk on treadmill. Pt required same cues mentioned above but again, no carry over, relying on therapist completely to advance RLE, block R knee from hyperextending, and to weight shift L. Again, pt refused to attempt any further ambulation in LiteGait - therapist discussed with pt importance of challenging ambulation in various ways to promote functional independence, gain reciprocal movement patterns, and build strength however pt continued to refuse.   Removed harness standing with +2 assist, then pt performed WC mobility 166ft using LUE/LLE and min A back to room. Pt refused to remain sitting in WC, insisting on returning to bed. Transferred WC<>bed via squat<>pivot to R with mod A. Removed shoes dependently and transferred into supine with mod A for RLE management. Concluded session with pt semi-reclined in bed, needs within reach, and bed alarm on. Laboratory present to draw blood and pt annoyed.  Treatment Session 2 Received pt sidelying in bed asleep with lights out. Pt aroused briefly with gentle verbal and tactile stimuli but then closed eyes, turned away, and repeated no to therapist's encouragement for OOB mobility. Educated pt on benefits of OOB mobility and maximizing time in rehab, to which pt responded with tomorrow. Pt ultimately refusing  to participate, stating she will participate again tomorrow. 45 minutes missed of skilled physical therapy due to refusal and fatigue.   Therapy Documentation Precautions:  Precautions Precautions: Fall Precaution Comments: PEG, expressive aphasia with agitation, Rt hemi Restrictions Weight Bearing Restrictions Per Provider Order: No  Therapy/Group:  Individual Therapy Therisa HERO Zaunegger Therisa Stains PT, DPT 04/08/2023, 6:54 AM

## 2023-04-08 NOTE — Progress Notes (Signed)
 Speech Language Pathology Daily Session Note  Patient Details  Name: Jacqueline [name] MRN: [id number] Date of Birth: [birth year]  Today's Date: 04/08/2023 SLP Individual Time: 0901-0958 SLP Individual Time Calculation (min): 57 min  Short Term Goals: Week 2: SLP Short Term Goal 1 (Week 2): Patient will follow one step directions with 50% acc given max multimodal cues SLP Short Term Goal 2 (Week 2): Patient will answer simple yes/ no questions with 60% acc given max multimodal cues SLP Short Term Goal 3 (Week 2): Patient will repeat single words with 60% acc with min multimodal cues SLP Short Term Goal 4 (Week 2): Patient will complete automatic speech task with 50% acc given max multimodal cues  Skilled Therapeutic Interventions:  Patient was seen in am to address expressive and receptive language. Pt was alert and seated upright in WC upon SLP arrival. Pt was on the phone with her boyfriend however, agreeable for session and ended phone call. Pt stated, They said you weren't coming indep. She also demonstrated frustration through nonverbal communication and ultimately stated, I'm hungry. She endorsed not receiving breakfast meal. SLP called down to dietary services and placed a breakfast order per pt preferences. SLP initiated session through challenging pt to identify personal and biographical information presented visually in binary format. Pt completed task with 88% acc indep. In similar presentation, she identified year, situation, and location indep. She was not oriented to month. Pt was challenged to repeat single words. She approximated her name on multiple attempts given mod encouragement to try. Increased frustration observed with expressive language task requiring frequent breaks and/or encouragement. SLP challenged pt in automatic speech through counting to 10. Though not in sequence pt produced 4 and 6. She was further challenged to repeat family member names with pt warranting max  encouragement with limited success. She was able to verbalize relation to individuals by stating, my sister and my mother. She answered yes/no questions related to environmental situations with 83% acc indep. Spontaneous speech during session included: they said you weren't coming, I'm hungry, what you talking about, I told you, I don't know, I can't. SLP planning to trial Lingraphica device in upcoming sessions. Pt left upright in WC with call button within reach and direct hand off to nurse. SLP to continue POC.   Pain Pain Assessment Pain Scale: 0-10 Faces Pain Scale: No hurt  Therapy/Group: Individual Therapy  Joane GORMAN Fuss 04/08/2023, 10:02 AM

## 2023-04-08 NOTE — Progress Notes (Incomplete)
Occupational Therapy Weekly Progress Note  Patient Details  Name: LEON GOODNOW MRN: [id number] Date of Birth: 01-18-2001  Beginning of progress report period: April 02, 2023 End of progress report period: April 09, 2023  Patient has met 2 of 4 short term goals. Pt is making gradual than anticipated progress towards LTGs. She fluctuates with participation and requires either very firm or very gentle approaches to engage pt in desired tasks with pt desiring to direct her own care which challenges pt's receptiveness to therapies recommendations.  She is able to bathe at an overall mod A level, dress UB with min A and LB with mod A, and requires mod assist for toileting tasks. Pt continues to demonstrate challenges with dense Rt hemiplegia (with RUE subluxation and shoulder/wrist pain from spasticity), sitting/standing balance, functional endurance, cognition (awareness, problem solving, safety awareness), GM and FM coordination resulting in difficulty completing BADL tasks without increased physical assist. Pt will benefit from continued skilled OT services to focus on mentioned deficits. Pt's mother did not attend arranged family education on 2/3 however will be needed in prep for DC.   Patient continues to demonstrate the following deficits: muscle weakness, decreased cardiorespiratoy endurance, abnormal tone, decreased coordination, and decreased motor planning, decreased awareness, decreased problem solving, and decreased safety awareness, and decreased sitting balance, decreased standing balance, and hemiplegia and therefore will continue to benefit from skilled OT intervention to enhance overall performance with BADL and Reduce care partner burden.  Patient progressing toward long term goals..  Continue plan of care.  OT Short Term Goals Week 1:  OT Short Term Goal 1 (Week 1): Pt will sit EOB for ADLs for > 3 mins with mod A for sitting balance OT Short Term Goal 1 - Progress (Week 1):  Met OT Short Term Goal 2 (Week 1): Pt will complete functional toilet transfer with max A using LRAD OT Short Term Goal 2 - Progress (Week 1): Met OT Short Term Goal 3 (Week 1): Pt with attend to RUE during ADL task with min verbal cues during functional task OT Short Term Goal 3 - Progress (Week 1): Met OT Short Term Goal 4 (Week 1): Pt will donn LB clothing with max A using AE PRN OT Short Term Goal 4 - Progress (Week 1): Met Week 2:  OT Short Term Goal 1 (Week 2): Pt will keep RUE in safe space during functional mobility with min questioning cues OT Short Term Goal 1 - Progress (Week 2): Met OT Short Term Goal 2 (Week 2): Pt will complete 2/3 toileting steps with Min A for standing balance OT Short Term Goal 2 - Progress (Week 2): Progressing toward goal OT Short Term Goal 3 (Week 2): Pt will donn UB clothing with mod A using hemi techniques OT Short Term Goal 3 - Progress (Week 2): Met OT Short Term Goal 4 (Week 2): Pt will donn RUE resting hand splint with min cues OT Short Term Goal 4 - Progress (Week 2): Progressing toward goal Week 3:  OT Short Term Goal 1 (Week 3): Pt will complete 2/3 toileting steps with Min A for standing balance OT Short Term Goal 2 (Week 3): Pt will donn RUE resting hand splint with min cues OT Short Term Goal 3 (Week 3): Pt will tolerate WB through RUE during functional task for at least 2 mins with min A provided for UE stability    Melvyn Novas, MS, OTR/L  04/08/2023, 3:24 PM

## 2023-04-08 NOTE — Progress Notes (Addendum)
 Occupational Therapy Session Note  Patient Details  Name: Jacqueline [name] MRN: [id number] Date of Birth: 2000/09/10  Today's Date: 04/08/2023 OT Individual Time: 0805-0900 OT Individual Time Calculation (min): 55 min    Short Term Goals: Week 2:  OT Short Term Goal 1 (Week 2): Pt will keep RUE in safe space during functional mobility with min questioning cues OT Short Term Goal 2 (Week 2): Pt will complete 2/3 toileting steps with Min A for standing balance OT Short Term Goal 3 (Week 2): Pt will donn UB clothing with mod A using hemi techniques OT Short Term Goal 4 (Week 2): Pt will donn RUE resting hand splint with min cues  Skilled Therapeutic Interventions/Progress Updates:  Skilled OT intervention completed with focus on ADL retraining. Pt received supine in bed, asleep, requiring gentle arousal to waken including opening blinds and speaking in soft voice. Pt then agreeable to session. Lt trapezius region and Rt wrist pain reported; pre-medicated. OT offered rest breaks and repositioning throughout as well as heating pad applied to Lt shoulder at end of session for pain reduction.  Transitioned to EOB with CGA only during trunk elevation. Utilized stedy for verbalized toileting urgency. Stood with supervision in stedy, then dependent transfer in stedy > BSC over toilet. Supervision stand from perched position, then max A to lower soiled brief. Pt had heavily incontinent (ancipitate pt really needed to go and time to get to commode with IV wasn't fast enough) of urinary void and menstrual blood noted as well. Pt with further continent urinary void and very large clot in commode. Stood supervision with stedy, then completed pericare with min A for dynamic balance, threaded brief max A. Transported to sink in stedy for ADLs to address core/postural stability and functional endurance with visual feedback.  In perched position in stedy, nurse disconnected RUE IV, then pt was able to doff gown with  min A following cues for behind head method. Able to wash UB with mod A, apply vaseline with mod A, donn new gown with min A following cues for hemi strategy. Oral care completed with min A due to tight toothpaste cap. MD present for rounds, and OT did notify of continued large clotting, with OT engaging in assist of communication between pt and MD regarding the risks of discontinuing the blood thinner just to manage the menstrual bleeding vs risk of another CVA. Pt receptive.  Supervision stand in stedy > w/c. With pt using hook method with RLE, pt able to assist with threading BLE with mod A, then stood with mod A with Rt knee guard to donn with min A over hips. OT donned socks, shoes (rather tight, but pt refusing for boyfriend to purchase bigger ones) with total A using leverage technique.  At end of session, pt not super happy about staying up in w/c for SLP with verbalization that she doesn't feel as her speech is getting better therefore does not feel SLP is necessary. Advised pt that the speech skill of the brain takes a long time to heal; did notify next SLP of pt head space. Pt remained seated in w/c, with belt alarm on/activated, and with all needs in reach at end of session.  Saebo Stim One  Applied for 60 mins (unattended) to the Rt deltoid for NMR and sublux management with the following parameters:  330 pulse width 35 Hz pulse rate On 8 sec/ off 8 sec Ramp up/ down 2 sec Symmetrical Biphasic wave form  Max intensity at  500 Ohm load  Removed from pt at end of cycle with no adverse skin reaction or irritation noted.    Therapy Documentation Precautions:  Precautions Precautions: Fall Precaution Comments: PEG, expressive aphasia with agitation, Rt hemi Restrictions Weight Bearing Restrictions Per Provider Order: No    Therapy/Group: Individual Therapy  Lorrayne FORBES Fritter, MS, OTR/L  04/08/2023, 9:28 AM

## 2023-04-08 NOTE — Progress Notes (Signed)
 PROGRESS NOTE   Subjective/Complaints: Continues to have heavy bleeding with blood clots, CBC ordered, gyn consulted, discussed with patient that this is likely from Eliquis   ROS: Limited by aphasia, +right sided weakness, improved appetite, +menses with heavy flow   Objective:   No results found.   Recent Labs    04/05/23 1458 04/07/23 0537  WBC 6.3 5.1  HGB 8.5* 7.8*  HCT 24.6* 22.3*  PLT 83* 81*    Recent Labs    04/07/23 0537  NA 139  K 3.2*  CL 114*  CO2 18*  GLUCOSE 89  BUN 9  CREATININE 0.88  CALCIUM  8.1*     Intake/Output Summary (Last 24 hours) at 04/08/2023 1007 Last data filed at 04/08/2023 0630 Gross per 24 hour  Intake 902.65 ml  Output 0 ml  Net 902.65 ml         Physical Exam: Vital Signs Blood pressure (!) 91/52, pulse 82, temperature 98.6 F (37 C), resp. rate 17, height 5' 4 (1.626 m), weight 77.1 kg, SpO2 100%, unknown if currently breastfeeding.   General: NAD, laying in bed, doesn't want to be bothered but more agreeable than yesterday  HEENT: Head is normocephalic, atraumatic, mucous membranes moist Neck: Supple without JVD or lymphadenopathy Heart: Reg rate and rhythm. No murmurs rubs or gallops Chest: CTA bilaterally without wheezes, rales, or rhonchi; no distress Abdomen: Positive bowel sounds, abdomen is soft, no TTP anywhere today, nondistended, G tube c/d/I-- not uncovered today.  Urostomy tube in place--not uncovered today Extremities: No clubbing, cyanosis, or edema. Pulses are 2+ Psych: Appears frustrated and irritable but calmer than yesterday Skin: Clean and intact without signs of breakdown over exposed surfaces, Gtube C/D/I Neuro: expressive aphasia, improving  MsK: RUE/RLE flaccid, moves LUE/LLE antigravity Musculoskeletal:     Cervical back: Normal range of motion and neck supple.     Comments: LUE/LLE moving well again gravity RUE- no voluntary movement seen  of RUE or RLE   Neurological:     Comments: Patient came back from working with therapy.  Awake and alert.  She does make eye contact with examiner.  Increased verbal output today. MAS of 3-4 in R shoulder esp External rotation; MAS of 4 in R elbow- lacking 45 degrees of extension with a lot of work; MAS of 2-3 of R hand- in fist at rest, but could completely open fingers and get past wrist neutral.  Difficult to assess sensation, stable 2/4    Assessment/Plan: 1. Functional deficits which require 3+ hours per day of interdisciplinary therapy in a comprehensive inpatient rehab setting. Physiatrist is providing close team supervision and 24 hour management of active medical problems listed below. Physiatrist and rehab team continue to assess barriers to discharge/monitor patient progress toward functional and medical goals  Care Tool:  Bathing    Body parts bathed by patient: Right arm, Chest, Abdomen, Front perineal area, Buttocks, Right upper leg, Left upper leg, Left lower leg, Face   Body parts bathed by helper: Right lower leg, Left arm     Bathing assist Assist Level: Moderate Assistance - Patient 50 - 74%     Upper Body Dressing/Undressing Upper body dressing  What is the patient wearing?: Pull over shirt    Upper body assist Assist Level: Minimal Assistance - Patient > 75%    Lower Body Dressing/Undressing Lower body dressing      What is the patient wearing?: Pants     Lower body assist Assist for lower body dressing: Moderate Assistance - Patient 50 - 74%     Toileting Toileting    Toileting assist Assist for toileting: Moderate Assistance - Patient 50 - 74%     Transfers Chair/bed transfer  Transfers assist  Chair/bed transfer activity did not occur: Safety/medical concerns (unsafe to get up)  Chair/bed transfer assist level: Moderate Assistance - Patient 50 - 74%     Locomotion Ambulation   Ambulation assist      Assist level: 2  helpers Assistive device: Other (comment) (L HHA) Max distance: 77ft   Walk 10 feet activity   Assist     Assist level: 2 helpers Assistive device: Other (comment) (L HHA)   Walk 50 feet activity   Assist Walk 50 feet with 2 turns activity did not occur: Safety/medical concerns  Assist level: 2 helpers Assistive device: Other (comment) (L HHA)    Walk 150 feet activity   Assist Walk 150 feet activity did not occur: Safety/medical concerns         Walk 10 feet on uneven surface  activity   Assist Walk 10 feet on uneven surfaces activity did not occur: Safety/medical concerns         Wheelchair     Assist Is the patient using a wheelchair?: Yes Type of Wheelchair: Manual Wheelchair activity did not occur: Safety/medical concerns         Wheelchair 50 feet with 2 turns activity    Assist    Wheelchair 50 feet with 2 turns activity did not occur: Safety/medical concerns       Wheelchair 150 feet activity     Assist  Wheelchair 150 feet activity did not occur: Safety/medical concerns       Blood pressure (!) 91/52, pulse 82, temperature 98.6 F (37 C), resp. rate 17, height 5' 4 (1.626 m), weight 77.1 kg, SpO2 100%, unknown if currently breastfeeding.   Medical Problem List and Plan: 1. 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  Antiphospholipid antibody syndrome: discussed that Eliquis  5mg  BID will help to reduce risk of clotting/strokes  Discussed that Eliquis  will increase her menstrual bleeds  3. Pain  Management: Neurontin  100 mg 3 times daily, Topamax  25 mg twice daily, oxycodone  5 mg every 4 hours as needed -03/30/23 some tingling in R arm, positional? Manageable with meds. OT working on releasing scapula and helping with positioning; monitor -04/05/23 pain controlled, cont regimen  4. Mood/Behavior/Sleep: Klonopin  0.5 mg nightly, melatonin 5 mg nightly as needed -antipsychotic agents: suggest Depakote  or increase in gabapentin  to help calm pt down;  5. Neuropsych/cognition: This patient is capable of making decisions on her own behalf. 6. Skin/Wound Care: routine  skin care for PEG and overall skin 7. Fluids/Electrolytes/Nutrition: will assess labs, monitor I&Os, cont supplements -03/29/23 K a little low 3.4, see #18; Na improved. BMP otherwise fairly unremarkable.  -03/30/23 K 4.0 today, BMP otherwise ok today, monitor -04/05/23 K 3.3, will give 60mEq today, monitor labs Monday  8.  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  Seen by oncology/hematology once in 2024-- needs to f/up  9.  Thrombocytopenia/iron  deficiency/folic acid  deficiency/B12 deficiency.  Follow-up CBC.  Continue Niferex 150mg  daily, and supplements  -04/05/23 Plt 90, stable, monitor   10.  Dysphagia.  Status post gastrostomy tube 03/21/2023 per interventional radiology.  Diet has been advanced to regular.  D/c TF. Discussed G-tube can be removed after 6-8 weeks from placement date  11.  E. coli UTI.  Antibiotic therapy completed 12.  Tobacco/alcohol use.  Counseling 13. Severe RUE spasticity- would strongly suggest Botox of RUE soon as well as resting hand splint for R wrist and R PRAFO- will order.   14. Abd pain- from PEG vs constipation- if no BM, might need KUB.  -IR consult to assess Gtube site.  Pus noted at skin site. Appears CT scan is ordered -Likely G-tube  infection.  She is going for a IR procedure today.  Hospitalist team following-appreciate assistance.  She has been started on vancomycin  and ceftriaxone  for abdominal infection.  Echocardiogram and cultures have been ordered.  Spoke with hospitalist today, ID has been consulted. -1/24 ID recommending vancomycin  for 7 days total and can use linezolid if discharged prior to this -03/29/23 appreciate hospitalist help, repeat BCx neg so far, ID signed off; looks like abd xray ordered, pending. Advancing diet as above.  -03/30/23 no abd pain today, other than menstrual cramping, has tylenol  if needed; abd xray yesterday fine. Hospitalist still following, no note yet today. Appreciate their assistance.  -04/05/23 still with abd cramping-- no significant tenderness; getting TVUS as below; monitor -04/06/23 no abd pain today, but LBM 04/03/23, cont miralax /colace BID but may need further intervention if no BM by tomorrow   15.  Elevated transaminases-mild, improved  Kpad ordered to minimize tylenol  use  16.  Hyponatremia: resolved, monitor Na weekly. Improved 04/05/23  17.  History of ileus: d/c feeding supplements and continue regular diet-- see #14 regarding constipation  18. Hypokalemia: klor 40 ordered 1/27, monitor potassium weekly-- see #7  19. Right sided wrist drop: wrist cock-up splint ordered, continue prn  20. Right sided ankle instability: aircast ordered, discussed with PT that it has been received, continue prn  21. Anemia/menorrhagia: discussed stool occult is positive, repeat ordered for today, CBC for today has not yet been drawn, chart reviewed and could be secondary to menses with started 1/31 -04/05/23 pt with dropping hgb the entire week, heavy menses all week, Hgb down to 6.9 overnight with associated hypotension/tachycardia, got 2U PRBCs. VS improving/stable. Spoke with mom and updated her. Pt understands, too.  -Spoke with OBGYN on call Dr. Ilean who requested we get a TVUS and start  Megace  40mg  BID, continue Eliquis , they will f/up with TVUS once it's done, may need to increase Megace  to 80mg  if heavy bleeding persists but for now start with 40mg  BID; monitor closely. F/up CBC after transfusion -04/06/23 post transfusion Hgb 8.5, recheck tomorrow morning; asked that RNs document regarding menstrual flow and clots, to monitor how Megace  is helping; must monitor closely; pelvic US  done  transabdominally only due to pt refusal, no abnormalities seen. Reach out to OBGYN if further assistance is needed  Repeat CBC today  22/ Hyperchloremia: may be 2/2 to dehydration from heavy menstrual bleeds, will start IVF, repeat BMP today  23. Hypotension: will start IVF, improved, continue IVF      LOS: 14 days A FACE TO FACE EVALUATION WAS PERFORMED  Jacqueline [name] 04/08/2023, 10:07 AM

## 2023-04-09 NOTE — Progress Notes (Signed)
 Nutrition Follow-up  DOCUMENTATION CODES:   Not applicable  INTERVENTION:   Encourage PO intake, Room service with assist MVI with minerals daily Magic cup TID with meals, each supplement provides 290 kcal and 9 grams of protein Nighttime snack Discontinue Ensure Enlive and Boost Breeze due to patients preference  If continued weight loss and confirmed poor PO intake recommend adding bolus feeds via PEG.   NUTRITION DIAGNOSIS:   Inadequate oral intake related to lethargy/confusion as evidenced by meal completion < 50%.  - Progressing   GOAL:   Patient will meet greater than or equal to 90% of their needs  - Ongoing   MONITOR:   PO intake, TF tolerance  REASON FOR ASSESSMENT:   Consult Enteral/tube feeding initiation and management  ASSESSMENT:   Pt with PMH of [place] isoimmunization pregnancy, unprovoked PE with positive lupus anticoagulant, hemoglobin C trait, anemia iron  deficient thrombocytopenia, tobacco use, has two children ages 45 and 7. 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  12/20 - admitted with expressive aphasia and right sided weakness (previous night drank significant amount of ETOH and took Suboxone) dx with L MCA and watershed territory infarcts with failed attempt at mechanical thrombectomy.  12/26 - MRI showed increased infarcts.  1/17 - PEG placed 1/21 - admitted to rehab 1/22 - Bolus TF stopped due to misplaced PEG 1/23- PEG tube replaced, 1/24 -  trickle feeds due to ileus  1/27 - D/C TF  Sleeping on visit. Spoke to RN who states she is eating good and had 100% of her breakfast today. No meals documented 2/4. 2 meals documented 2/3 that show only 2 meals recorded; 25% for breakfast and 0% for lunch. Family has been bringing in meals and question if she has been eating those instead of meal trays provided. New weight shows 9 lbs weight loss. If continued weight loss and confirmed poor PO intake recommend adding bolus feeds via PEG. Refuses  supplements.   Admit weight: 77.1 kg  Current weight: 72.9 kg    Average Meal Intake: 2/1-2/5: 31% intake x 8 recorded meals  Nutritionally Relevant Medications: Scheduled Meds:  vitamin B-12  2,000 mcg Oral Daily   docusate sodium   100 mg Oral BID   feeding supplement  1 Container Oral BID BM   folic acid   1 mg Oral Daily   gabapentin   100 mg Oral TID   iron  polysaccharides  150 mg Oral Daily   megestrol   40 mg Oral BID   multivitamin with minerals  1 tablet Oral Daily   simethicone   40 mg Oral QID   Labs Reviewed: No recent labs: 2/4: Chloride 112, Calcium  8.8,    Diet Order:   Diet Order             Diet regular Room service appropriate? Yes with Assist; Fluid consistency: Thin  Diet effective now                   EDUCATION NEEDS:   No education needs have been identified at this time  Skin:  Skin Assessment: Reviewed RN Assessment  Last BM:  04/07/2023 type 5  Height:   Ht Readings from Last 1 Encounters:  03/25/23 5' 4 (1.626 m)    Weight:   Wt Readings from Last 1 Encounters:  04/09/23 72.9 kg    Ideal Body Weight:  54.5 kg  BMI:  Body mass index is 27.59 kg/m.  Estimated Nutritional Needs:   Kcal:  2000-2200  Protein:  100-115 grams  Fluid:  >2L/day   Olivia Kenning, RD Registered Dietitian  See Amion for more information

## 2023-04-09 NOTE — Progress Notes (Signed)
 Occupational Therapy Session Note  Patient Details  Name: Jacqueline MCCLAFFERTY MRN: [id number] Date of Birth: [birth year]  Today's Date: 04/09/2023 OT Individual Time: 0805-0900 & [id number] OT Individual Time Calculation (min): 55 min & 86 min   Short Term Goals: Week 3:  OT Short Term Goal 1 (Week 3): Pt will complete 2/3 toileting steps with Min A for standing balance OT Short Term Goal 2 (Week 3): Pt will donn RUE resting hand splint with min cues OT Short Term Goal 3 (Week 3): Pt will tolerate WB through RUE during functional task for at least 2 mins with min A provided for UE stability  Skilled Therapeutic Interventions/Progress Updates:  Session 1 Skilled OT intervention completed with focus on activity tolerance, behavioral management, functional sit > stands and transfers. Pt received supine in bed. Initially trying to refuse session by ignoring therapist, pulling covers over her head and yelling no. OT opened blinds, and attempted gentle stimulation to increase arousal/participation. NT present to weigh pt with covers needed to be removed. Pt became agitated with this, but with more than reasonable amount of time, pt tolerant of getting OOB. No pain reported during session.   Transitioned with mod A > EOB. Offered pt an opportunity to express feelings or frustrations which would warrant pt's current behavior towards therapist, however pt denied that anything was bothering her. Threaded pants with CGA for sitting balance while OT lifted RLE from floor. Pt refused to wear shoes and grip socks, desiring only regular socks, but with education about fall prevention, pt reluctant but chose grip socks which were donned dependently. Stood with mod A without AD then donned pants over hips with min A for Rt side. Mod A stand pivot > w/c.   Seated at sink, pt washed face with set up A, min A needed for oral care as pt less receptive to attempting hemi technique on her own due to tight cap but no assist  for actual brushing. Transported dependently in w/c <> gym.   Pt participated in the following dynamic standing balance and endurance tasks to promote independence and safety during BADLs and functional mobility: -cornhole toss activity using LUE. Min A sit > stand without AD, and min/mod A needed for balance with Rt knee guard; cues needed for attempting knee extension for weight acceptance however little activiation. No formal LOB  Back in room, pt remained seated in w/c, with belt alarm on/activated, breakfast set up and with all needs in reach at end of session.  Session 2 Skilled OT intervention completed with focus on ADL retraining, functional endurance, and mobility within a shower context. Pt received semi supine in bed. Pt remained expressively aphasic and quieter this session but did start verbalizing more as session progressed. No pain expressed pain. Pt agreeable to shower.   Pt completed all sit > stands with CGA/supervision and dependent transfers with use of stedy for time management during session with cues needed for using LUE on grab bar.   Transitioned to EOB with mod A for RLE. Stedy transfer > TTB in shower. Per nursing coordinator; pt okay to have PEG drain uncovered during shower, avoiding direct spray of water  with dressing change following therefore did not cover. Waterproof cover applied to IV prior to shower. Pt was able to bathe all parts with overall min A for LUE, lifting RUE for access to underarm and CGA during lean forward for pt to wash BLE. Pt with no spontaneous activation of flaccid RUE. Cues needed for thoroughness  of all parts, with slight perseveration on periarea and thighs, and for sequencing. Stedy transfer > sink, where in perched position in front of mirror to address midline/postural endurance, pt applied vaseline to skin with mod A and deodorant with min A. OT doffed PEG dressing, cleaned skin surrounding, and reapplied dressing with paper tape; nursing  made aware.  Min A overall for donning of new gown, then transfer to w/c. Pt initiated calling her kids on facetime who was present with their birth dad at the time. Pt asked OT to engage in conversation with her to assist with communication as pt and her ex were in heated discussion regarding the care of the kids and pt was frustrated about inability to convey her true thoughts about who was around the kids and pt's preference of the kids being with her sister so she doesn't have to worry about them. CSW notified of dynamic and care team as well, as pt expressed this is part of why she has been so dead set on DC and resistant to recommendations (tunnel vision for her kids).   Redirected pt to application of saebo to RUE wrist extensors. OT provided gentle PROM wrist/digit extension during on phase of the stimulation as stimulation did not fully extend wrist and pt not tolerating higher level. Assisted pt via mod A stand pivot back to bed, mod A for RLE for sit > supine. Pt remained upright in bed, with bed alarm on/activated, and with all needs in reach at end of session  Saebo Stim One  Applied for 45 mins (unattended) to the Rt wrist extensors for NMR and tone reduction with the following parameters:  330 pulse width 35 Hz pulse rate On 8 sec/ off 8 sec Ramp up/ down 2 sec Symmetrical Biphasic wave form  Max intensity at 500 Ohm load  Removed from pt at end of cycle with no adverse skin reaction or irritation noted.  Therapy Documentation Precautions:  Precautions Precautions: Fall Precaution Comments: PEG, expressive aphasia with agitation, Rt hemi Restrictions Weight Bearing Restrictions Per Provider Order: No    Therapy/Group: Individual Therapy  Lorrayne FORBES Fritter, MS, OTR/L  04/09/2023, 4:02 PM

## 2023-04-09 NOTE — Progress Notes (Signed)
 Physical Therapy Session Note  Patient Details  Name: Jacqueline [name] MRN: [id number] Date of Birth: Jan 22, 2001  Today's Date: 04/09/2023 PT Individual Time: 0930-1041 PT Individual Time Calculation (min): 71 min   Short Term Goals: Week 1:  PT Short Term Goal 1 (Week 1): pt will transfer supine<>sitting EOB with mod A of 1 PT Short Term Goal 1 - Progress (Week 1): Met PT Short Term Goal 2 (Week 1): pt will transfer sit<>stand with LRAD and mod A of 1 PT Short Term Goal 2 - Progress (Week 1): Met PT Short Term Goal 3 (Week 1): pt will ambulate 41ft with LRAD and max A of 1 PT Short Term Goal 3 - Progress (Week 1): Progressing toward goal Week 2:  PT Short Term Goal 1 (Week 2): pt will perform all aspects of bed mobility with mod A consistantly PT Short Term Goal 2 (Week 2): pt will transfer bed<>chair with mod A of 1 consistantly PT Short Term Goal 3 (Week 2): pt will ambulate 68ft with LRAD and skilled max A of 1  Skilled Therapeutic Interventions/Progress Updates:   Received pt sitting in [facility] with MD facetiming pt's sister. Pt agreeable to PT treatment and did not complain of pain during session. Session with emphasis on functional mobility/transfers, generalized strengthening and endurance, dynamic standing balance/coordination, NMR, and gait training. Donned socks and shoes with max A and pt performed WC mobility 134ft using R hemi technique and mod A to main therapy gym - cues for propulsion technique and stride efficiency and encouragement to attempt as pt initially refusing stating why.  Donned shoe cover to R foot and stood from [facility] with mod A and ambulated 142ft with L HHA and therapist providing max A to advance, place, and block R knee from hyperextension. Provided cues to kick R foot and stand tall on RLE but pt demonstrating poor carry over with cues and just waiting for therapist to advance leg. Transferred on/off mat via squat<>pivot with min/mod A and mod cues for technique.  Worked on standing mini squats 2x10 with min blocking from R knee hyperextension with emphasis on quad strength. Pt fearfull of falling reaching out for rehab tech's hand. Transitioned to standing hooking/unhooking horshoes to/from top R corner of mirror x 4 trials with mod A with emphasis on weight shifting to R and weight bearing through RLE. Pt required assist to maintain R knee extension, cues to straighten knee - pt with multiple posterior LOB onto mat.   Returned to room, pt refused to remain in [facility], and transferred WC<>bed stand<>pivot with mod A. Removed shoes and transferred into supine with mod A for RLE management. Bridged to remove pants with max A and assist to manage RLE. Pt demanded to have blinds closed and began yelling no when therapist encouraged leaving them open. Concluded session with pt semi-reclined in bed, needs within reach, and bed alarm on.   Therapy Documentation Precautions:  Precautions Precautions: Fall Precaution Comments: PEG, expressive aphasia with agitation, Rt hemi Restrictions Weight Bearing Restrictions Per Provider Order: No  Therapy/Group: Individual Therapy Therisa HERO Zaunegger Therisa Stains PT, DPT 04/09/2023, 6:54 AM

## 2023-04-09 NOTE — Progress Notes (Signed)
 PROGRESS NOTE   Subjective/Complaints: Continues to have menstrual bleeding Prefers no blood draws today Discussed Hgb increased yesterday Discussed chloride decreased yesterday  ROS: Limited by aphasia, +right sided weakness, improved appetite, +menses with heavy flow, +right upper extremity pain   Objective:   No results found.   Recent Labs    04/07/23 0537 04/08/23 1056  WBC 5.1 6.1  HGB 7.8* 8.3*  HCT 22.3* 24.0*  PLT 81* 103*    Recent Labs    04/07/23 0537 04/08/23 1056  NA 139 138  K 3.2* 3.7  CL 114* 112*  CO2 18* 19*  GLUCOSE 89 88  BUN 9 9  CREATININE 0.88 0.95  CALCIUM  8.1* 8.8*     Intake/Output Summary (Last 24 hours) at 04/09/2023 1139 Last data filed at 04/09/2023 0945 Gross per 24 hour  Intake 120 ml  Output --  Net 120 ml         Physical Exam: Vital Signs Blood pressure (!) 95/57, pulse 96, temperature 98.5 F (36.9 C), temperature source Oral, resp. rate 17, height 5' 4 (1.626 m), weight 72.9 kg, SpO2 100%, unknown if currently breastfeeding.   General: NAD, laying in bed, doesn't want to be bothered but more agreeable than yesterday  HEENT: Head is normocephalic, atraumatic, mucous membranes moist Neck: Supple without JVD or lymphadenopathy Heart: Reg rate and rhythm. No murmurs rubs or gallops Chest: CTA bilaterally without wheezes, rales, or rhonchi; no distress Abdomen: Positive bowel sounds, abdomen is soft, no TTP anywhere today, nondistended, G tube c/d/I-- not uncovered today.  Urostomy tube in place--not uncovered today Extremities: No clubbing, cyanosis, or edema. Pulses are 2+ Psych: Appears frustrated and irritable but calmer than yesterday Skin: Clean and intact without signs of breakdown over exposed surfaces, Gtube C/D/I Neuro: expressive aphasia, improving  MsK: RUE/RLE flaccid, moves LUE/LLE antigravity Musculoskeletal:     Cervical back: Normal range of  motion and neck supple.     Comments: LUE/LLE moving well again gravity RUE- no voluntary movement seen of RUE or RLE   Neurological:     Comments: Patient came back from working with therapy.  Awake and alert.  She does make eye contact with examiner.  Increased verbal output today. MAS of 3-4 in R shoulder esp External rotation; MAS of 4 in R elbow- lacking 45 degrees of extension with a lot of work; MAS of 2-3 of R hand- in fist at rest, but could completely open fingers and get past wrist neutral.  Difficult to assess sensation, stable 2/5    Assessment/Plan: 1. Functional deficits which require 3+ hours per day of interdisciplinary therapy in a comprehensive inpatient rehab setting. Physiatrist is providing close team supervision and 24 hour management of active medical problems listed below. Physiatrist and rehab team continue to assess barriers to discharge/monitor patient progress toward functional and medical goals  Care Tool:  Bathing    Body parts bathed by patient: Right arm, Chest, Abdomen, Front perineal area, Buttocks, Right upper leg, Left upper leg, Left lower leg, Face   Body parts bathed by helper: Right lower leg, Left arm     Bathing assist Assist Level: Moderate Assistance - Patient 50 -  74%     Upper Body Dressing/Undressing Upper body dressing   What is the patient wearing?: Pull over shirt    Upper body assist Assist Level: Minimal Assistance - Patient > 75%    Lower Body Dressing/Undressing Lower body dressing      What is the patient wearing?: Pants     Lower body assist Assist for lower body dressing: Moderate Assistance - Patient 50 - 74%     Toileting Toileting    Toileting assist Assist for toileting: Moderate Assistance - Patient 50 - 74%     Transfers Chair/bed transfer  Transfers assist  Chair/bed transfer activity did not occur: Safety/medical concerns (unsafe to get up)  Chair/bed transfer assist level: Moderate Assistance -  Patient 50 - 74%     Locomotion Ambulation   Ambulation assist      Assist level: 2 helpers Assistive device: Other (comment) (L HHA) Max distance: 192ft   Walk 10 feet activity   Assist     Assist level: 2 helpers Assistive device: Other (comment) (L HHA)   Walk 50 feet activity   Assist Walk 50 feet with 2 turns activity did not occur: Safety/medical concerns  Assist level: 2 helpers Assistive device: Other (comment) (L HHA)    Walk 150 feet activity   Assist Walk 150 feet activity did not occur: Safety/medical concerns         Walk 10 feet on uneven surface  activity   Assist Walk 10 feet on uneven surfaces activity did not occur: Safety/medical concerns         Wheelchair     Assist Is the patient using a wheelchair?: Yes Type of Wheelchair: Manual Wheelchair activity did not occur: Safety/medical concerns  Wheelchair assist level: Supervision/Verbal cueing Max wheelchair distance: 146ft    Wheelchair 50 feet with 2 turns activity    Assist    Wheelchair 50 feet with 2 turns activity did not occur: Safety/medical concerns   Assist Level: Supervision/Verbal cueing   Wheelchair 150 feet activity     Assist  Wheelchair 150 feet activity did not occur: Safety/medical concerns   Assist Level: Supervision/Verbal cueing   Blood pressure (!) 95/57, pulse 96, temperature 98.5 F (36.9 C), temperature source Oral, resp. rate 17, height 5' 4 (1.626 m), weight 72.9 kg, SpO2 100%, unknown if currently breastfeeding.   Medical Problem List and Plan: 1. 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  Antiphospholipid antibody syndrome: discussed that Eliquis  5mg  BID will help to reduce risk of clotting/strokes, continue  Discussed that Eliquis  will increase her menstrual bleeds  3. Pain Management: Neurontin  100 mg 3 times daily, topamax  d/ced given hyperchloremia, oxycodone  5 mg every 4 hours as needed -03/30/23 some tingling in R arm, positional? Manageable with meds. OT working on releasing scapula and helping with positioning; monitor -04/05/23 pain controlled, cont regimen  4. Anxiety: continue Klonopin  0.5 mg  nightly, melatonin 5 mg nightly as needed -antipsychotic agents: suggest Depakote  or increase in gabapentin  to help calm pt down;  5. Neuropsych/cognition: This patient is capable of making decisions on her own behalf. 6. Skin/Wound Care: routine skin care for PEG and overall skin 7. Fluids/Electrolytes/Nutrition: will assess labs, monitor I&Os, cont supplements -03/29/23 K a little low 3.4, see #18; Na improved. BMP otherwise fairly unremarkable.  -03/30/23 K 4.0 today, BMP otherwise ok today, monitor -04/05/23 K 3.3, will give 60mEq today, monitor labs Monday  8.  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  Seen by oncology/hematology once in 2024-- needs to f/up  9.  Thrombocytopenia/iron  deficiency/folic acid  deficiency/B12 deficiency.  Follow-up CBC.  Continue Niferex 150mg  daily, and supplements  -04/05/23 Plt 90, stable, monitor   10.  Dysphagia.  Status post gastrostomy tube 03/21/2023 per interventional radiology.  Diet has been advanced to regular.  D/c TF. Discussed G-tube can be removed after 6-8 weeks from placement date  11.  E. coli UTI.   Antibiotic therapy completed 12.  Tobacco/alcohol use.  Counseling 13. Severe RUE spasticity- would strongly suggest Botox of RUE soon as well as resting hand splint for R wrist and R PRAFO- will order.   14. Abd pain- from PEG vs constipation- if no BM, might need KUB.  -IR consult to assess Gtube site.  Pus noted at skin site. Appears CT scan is ordered -Likely G-tube infection.  She is going for a IR procedure today.  Hospitalist team following-appreciate assistance.  She has been started on vancomycin  and ceftriaxone  for abdominal infection.  Echocardiogram and cultures have been ordered.  Spoke with hospitalist today, ID has been consulted. -1/24 ID recommending vancomycin  for 7 days total and can use linezolid if discharged prior to this -03/29/23 appreciate hospitalist help, repeat BCx neg so far, ID signed off; looks like abd xray ordered, pending. Advancing diet as above.  -03/30/23 no abd pain today, other than menstrual cramping, has tylenol  if needed; abd xray yesterday fine. Hospitalist still following, no note yet today. Appreciate their assistance.  -04/05/23 still with abd cramping-- no significant tenderness; getting TVUS as below; monitor -04/06/23 no abd pain today, but LBM 04/03/23, cont miralax /colace BID but may need further intervention if no BM by tomorrow   15.  Elevated transaminases-mild, improved  Kpad ordered to minimize tylenol  use  16.  Hyponatremia: resolved, monitor Na weekly. Improved 04/05/23  17.  History of ileus: d/c feeding supplements and continue regular diet-- see #14 regarding constipation  18. Hypokalemia: klor 40 ordered 1/27, monitor potassium weekly-- see #7  19. Right sided wrist drop: wrist cock-up splint ordered, continue prn  20. Right sided ankle instability: aircast ordered, discussed with PT that it has been received, continue prn  21. Anemia/menorrhagia: discussed stool occult is positive, repeat ordered for today, CBC for today has not yet been  drawn, chart reviewed and could be secondary to menses with started 1/31 -04/05/23 pt with dropping hgb the entire week, heavy menses all week, Hgb down to 6.9 overnight with associated hypotension/tachycardia, got 2U PRBCs. VS improving/stable. Spoke with mom and updated her. Pt understands, too.  -Spoke with OBGYN on call Dr. Ilean who requested we get a TVUS and start Megace  40mg  BID, continue Eliquis , they will f/up with TVUS once it's done, may need to increase Megace  to 80mg  if heavy bleeding persists but for now start  with 40mg  BID; monitor closely. F/up CBC after transfusion -04/06/23 post transfusion Hgb 8.5, recheck tomorrow morning; asked that RNs document regarding menstrual flow and clots, to monitor how Megace  is helping; must monitor closely; pelvic US  done transabdominally only due to pt refusal, no abnormalities seen. Reach out to OBGYN if further assistance is needed  Repeat CBC today  22/ Hyperchloremia: may be 2/2 to dehydration from heavy menstrual bleeds, will start IVF, repeat BMP today  23. Hypotension: will start IVF, improved, continue IVF      LOS: 15 days A FACE TO FACE EVALUATION WAS PERFORMED  Sven SQUIBB Quentyn Kolbeck 04/09/2023, 11:39 AM

## 2023-04-09 NOTE — Progress Notes (Signed)
 Patient ID: Jacqueline [name], female   DOB: January 24, 2001, 23 y.o.   MRN: [id number]  Spoke with sister-[name] to inform of team conference progress this week and target discharge date 2/12. She feels this is way too early and wants to talk with the MD regarding this. Also discussed the letter for pt to assist with staying in the place she had just moved too prior to admission here. She will let me know where to sent it. Have let her know the she has been allowed to come in and Wylie Murray has been removed due to she has not come and her sister is involved in her discharge plans. Still working on [employer] and awaiting chaplain to come up and complete forms. Will make referral to [facility] to assist with SSI application. Sister believes pt can do much better and needs more therapies. Aware pt wants to leave and be at home. Will continue to work on discharge needs. Sister is aware pt will need 24/7 care at discharge.

## 2023-04-09 NOTE — Progress Notes (Addendum)
 Occupational Therapy Weekly Progress Note  Patient Details  Name: Jacqueline [name] MRN: [id number] Date of Birth: 10/11/2000   Beginning of progress report period: April 02, 2023 End of progress report period: April 09, 2023   Patient has met 2 of 4 short term goals. Pt is making gradual progress towards LTGs. She fluctuates with participation and requires either very firm or very gentle approaches to engage pt in desired tasks with pt desiring to direct her own care which challenges pt's receptiveness to therapies recommendations. Behavior plan already established.   She is able to bathe at an overall mod A level, dress UB with min A and LB with mod A, and requires mod assist for toileting tasks. Pt continues to demonstrate challenges with dense Rt hemiplegia (with RUE subluxation and shoulder/wrist pain from spasticity), sitting/standing balance, functional endurance, cognition (awareness, problem solving, safety awareness), GM and FM coordination resulting in difficulty completing BADL tasks without increased physical assist. Pt will benefit from continued skilled OT services to focus on mentioned deficits. Pt's mother did not attend arranged family education on 2/3 however will be needed in prep for DC.     Patient continues to demonstrate the following deficits: muscle weakness, decreased cardiorespiratoy endurance, abnormal tone, decreased coordination, and decreased motor planning, decreased awareness, decreased problem solving, and decreased safety awareness, and decreased sitting balance, decreased standing balance, and hemiplegia and therefore will continue to benefit from skilled OT intervention to enhance overall performance with BADL and Reduce care partner burden.   Patient progressing toward long term goals..  Continue plan of care.   OT Short Term Goals Week 1:  OT Short Term Goal 1 (Week 1): Pt will sit EOB for ADLs for > 3 mins with mod A for sitting balance OT Short Term  Goal 1 - Progress (Week 1): Met OT Short Term Goal 2 (Week 1): Pt will complete functional toilet transfer with max A using LRAD OT Short Term Goal 2 - Progress (Week 1): Met OT Short Term Goal 3 (Week 1): Pt with attend to RUE during ADL task with min verbal cues during functional task OT Short Term Goal 3 - Progress (Week 1): Met OT Short Term Goal 4 (Week 1): Pt will donn LB clothing with max A using AE PRN OT Short Term Goal 4 - Progress (Week 1): Met Week 2:  OT Short Term Goal 1 (Week 2): Pt will keep RUE in safe space during functional mobility with min questioning cues OT Short Term Goal 1 - Progress (Week 2): Met OT Short Term Goal 2 (Week 2): Pt will complete 2/3 toileting steps with Min A for standing balance OT Short Term Goal 2 - Progress (Week 2): Progressing toward goal OT Short Term Goal 3 (Week 2): Pt will donn UB clothing with mod A using hemi techniques OT Short Term Goal 3 - Progress (Week 2): Met OT Short Term Goal 4 (Week 2): Pt will donn RUE resting hand splint with min cues OT Short Term Goal 4 - Progress (Week 2): Progressing toward goal Week 3:  OT Short Term Goal 1 (Week 3): Pt will complete 2/3 toileting steps with Min A for standing balance OT Short Term Goal 2 (Week 3): Pt will donn RUE resting hand splint with min cues OT Short Term Goal 3 (Week 3): Pt will tolerate WB through RUE during functional task for at least 2 mins with min A provided for UE stability       Leilani Cespedes FORBES Fritter,  MS, OTR/L  04/09/2023, 7:53 AM

## 2023-04-09 NOTE — Progress Notes (Addendum)
 Patient ID: Jacqueline [name], female   DOB: 2000-03-05, 23 y.o.   MRN: [id number]  Mom had called this worker and left a message have called back but her phone goes right to voice mail and it is currently full. Will reach out to sister after team conference. Will sent referral to [facility] to assist with SSI application. Have written letter for her court regarding eviction

## 2023-04-10 LAB — CBC
HCT: 23.6 % — ABNORMAL LOW (ref 36.0–46.0)
Hemoglobin: 8 g/dL — ABNORMAL LOW (ref 12.0–15.0)
MCH: 28.3 pg (ref 26.0–34.0)
MCHC: 33.9 g/dL (ref 30.0–36.0)
MCV: 83.4 fL (ref 80.0–100.0)
Platelets: 90 10*3/uL — ABNORMAL LOW (ref 150–400)
RBC: 2.83 MIL/uL — ABNORMAL LOW (ref 3.87–5.11)
RDW: 18.8 % — ABNORMAL HIGH (ref 11.5–15.5)
WBC: 5 10*3/uL (ref 4.0–10.5)
nRBC: 0 % (ref 0.0–0.2)

## 2023-04-10 NOTE — Patient Care Conference (Signed)
 Inpatient RehabilitationTeam Conference and Plan of Care Update Date: 04/09/2023   Time: 11:34 AM    Patient Name: Jacqueline [name]      Medical Record Number: [id number]  Date of Birth: 2000/08/08 Sex: Female         Room/Bed: 4W14C/4W14C-01 Payor Info: Payor: [place] MEDICAID PREPAID HEALTH PLAN / Plan: [place] MEDICAID [employer] / Product Type: *No Product type* /    Admit Date/Time:  03/25/2023  2:38 PM  Primary Diagnosis:  Left middle cerebral artery stroke [facility])  Hospital Problems: Principal Problem:   Left middle cerebral artery stroke (HCC) Active Problems:   Thrombocytopenia (HCC)   Iron  deficiency anemia   Lupus anticoagulant disorder (HCC)   SIRS (systemic inflammatory response syndrome) (HCC)   Hyponatremia   Ileus (HCC)   Pain around PEG tube site, initial encounter   Transaminitis   History of pulmonary embolism   Bacteremia    Expected Discharge Date: Expected Discharge Date: 04/16/23  Team Members Present: Physician leading conference: Dr. Sven Elks Social Worker Present: Rhoda Clement, LCSW Nurse Present: Barnie Ronde, [place], RN PT Present: Therisa Stains, PT OT Present: Lorrayne Fritter, OT SLP Present: Rosina Downy, SLP     Current Status/Progress Goal Weekly Team Focus  Bowel/Bladder   Patient is continent of bowel and bladder but requires a lot of assistance to get to bathroom.   Become more independent in bathrooming.   Continue good work with PT/OT.  She is very strong.  Work on bathroom transfers.    Swallow/Nutrition/ Hydration               ADL's   Mod A overall for all BADLs   Min A   Barriers- low frustration tolerance, pain level (PEG site, menstrual and R hemibody), perseveration, functional endurance, Rt hemi with spasticity in RUE, fam ed not initiated and lack of attendance when arranged    Mobility   bed mobility mod A, stand/squat<>pivot transfers mod A, gait 116ft with mod A +2   CGA/supervision, min A  steps  barriers: expressive aphasia, frustration/agitation, fatigue, fluctuating motvation levels, decreased standing balance/coordination, R hemiparesis, and pain    Communication   mod to 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  Pain is at [place] site or in neck when present.  She constantly asks when the Gtube can come out.  It is very tender to the touch.   Work on possibly getting the g-tube removed as it is a source of pain.  continue with 1-10 pain scale and current pain regimin and at some point wean to just tylenol , heat and positioning.   Encourage multimodals for pain such as heat, positioning, ice packs.  Let patient know when she can possibly expect to have g tube removed.  possibly use lidocaine  patches near but not on g tube site.    Skin   Patient skin is in good condition.  She is able to reposition herself in bed.   Maintain skin integrity.  Continue with good nutritional intake, good hygeine and bed mobility.      Discharge Planning:  Mom did not show up for education session-spoke with sister-Khadiajah who  wants to be HCPOA and MD in agreement pt capable of making her own decisions. Trying to confirm plan of where she is going and who will be providing her care-ever changing process. Pt seems to be participating more in therapies   Team Discussion: Patient admitted post left MCA CVA with antiphospholipid syndrome.  Currently limited by behavior and decreased frustration tolerance with decreased awareness of deficits, poor safety awareness and perseveration with expressive language deficits, using neologisms and paraphasias.  Can be argumentative at times and refusing to  participate in therapy sessions and learned helplessness; requesting staff do for her.   Patient on target to meet rehab goals: no, currently needs mod assist for ADLs due to right UE pain and fatigue.  Needs mod assist for right leg management, min - mod assist for squat pivot transfers.  Able to ambulate up to 180' with mod +2 assist. Goals for discharge set for CGA- min assist; min assist for steps.  *See Care Plan and progress notes for long and short-term goals.   Revisions to Treatment Plan:  Vaginal Ultrasound-heavy menstruation on Eliquis  Custom wheelchair evaluation SABO trial Communication app trial   Teaching Needs: Safety, medications, dietary modification recommendations, skin care/PEG care, transfers, toileting, etc.   Current Barriers to Discharge: Decreased caregiver support, Home enviroment access/layout, and Behavior  Possible Resolutions to Barriers: Family education 24/7 care recommended OP follow up services DME: W/C     Medical Summary Current Status: antiphospholipid antibody syndrome, heavy mentrual bleed, overweight, hyperchloremia  Barriers to Discharge: Medical stability  Barriers to Discharge Comments: antiphospholipid antibody syndrome, heavy menstrual bleed, overwieght, hyperchloremia Possible Resolutions to [employer] Focus: continue Eliquis , OB/GYN consulted, provide dietary education, continue to monitor Hgb, conitnue to monitor chloride level, topamax  d/ced   Continued Need for Acute Rehabilitation Level of Care: The patient requires daily medical management by a physician with specialized training in physical medicine and rehabilitation for the following reasons: Direction of a multidisciplinary physical rehabilitation program to maximize functional independence : Yes Medical management of patient stability for increased activity during participation in an intensive rehabilitation regime.: Yes Analysis of laboratory values and/or radiology  reports with any subsequent need for medication adjustment and/or medical intervention. : Yes   I attest that I was present, lead the team conference, and concur with the assessment and plan of the team.   Fredericka Sober B 04/10/2023, 11:57 AM

## 2023-04-10 NOTE — Progress Notes (Signed)
 Speech Language Pathology Weekly Progress and Session Note  Patient Details  Name: Jacqueline [name] MRN: [id number] Date of Birth: 11/09/2000  Beginning of progress report period: April 02, 2023 End of progress report period: April 10, 2023  Today's Date: 04/10/2023 SLP Individual Time: 8646-8557 SLP Individual Time Calculation (min): 49 min  Short Term Goals: Week 2: SLP Short Term Goal 1 (Week 2): Patient will follow one step directions with 50% acc given max multimodal cues SLP Short Term Goal 1 - Progress (Week 2): Met SLP Short Term Goal 2 (Week 2): Patient will answer simple yes/ no questions with 60% acc given max multimodal cues SLP Short Term Goal 2 - Progress (Week 2): Met SLP Short Term Goal 3 (Week 2): Patient will repeat single words with 60% acc with min multimodal cues SLP Short Term Goal 3 - Progress (Week 2): Not met SLP Short Term Goal 4 (Week 2): Patient will complete automatic speech task with 50% acc given max multimodal cues SLP Short Term Goal 4 - Progress (Week 2): Met  New Short Term Goals: Week 3: SLP Short Term Goal 1 (Week 3): Patient will follow one step directions with 75% acc given mod multimodal cues SLP Short Term Goal 2 (Week 3): Patient will answer simple yes/ no questions with 60% acc given supervision multimodal cues SLP Short Term Goal 3 (Week 3): Patient will repeat single words with 60% acc with min multimodal cues SLP Short Term Goal 4 (Week 3): Patient will complete automatic speech task with 50% acc given max multimodal cues  Weekly Progress Updates: Patient is making excellent progress towards therapy goals meeting 3/4 short term goals set this reporting period. Patient currently answers simple yes/no questions with 100% accuracy given min assist and follows one-step directions with 100% accuracy when given max assist. She completes automatic speech tasks in 4/5 opportunities when given max assist. Patient continues to struggle with repetition  of single words, achieving 0% accuracy despite total assist 04/10/23, though refused to target previously targeted functional words. Patient and family education ongoing. Patient will continue to benefit from skilled therapy services during remainder of CIR stay.      Intensity: Minumum of 1-2 x/day, 30 to 90 minutes Frequency: 3 to 5 out of 7 days Duration/Length of Stay: 3-4 weeks Treatment/Interventions: Dysphagia/aspiration precaution training;Internal/external aids;Cognitive remediation/compensation;Speech/Language facilitation;Cueing hierarchy;Environmental controls;Therapeutic Activities;Multimodal communication approach;Patient/family education;Functional tasks  Daily Session  Skilled Therapeutic Interventions: SLP conducted skilled therapy session targeting communication goals. Patient agreeable to speech therapy session and demonstrating improved automatic and functional environmental speech. SLP and patient discussed family, preferences, and daily events with patient requiring mod assist overall to convey basic communicative intent, though specific speech content continues to be pervaded by neologisms and jargon. During card game task, patient comprehended mildly-moderately complex rules and executed them with min assist. At end of session, she followed 1-step environmental commands with supervision during pericare utilizing stedy. She requested assistance to bathroom but was then incontinent of bladder, thus pericare completed by NT and SLP and patient transferred back to bed. After episode of incontinence, patient indicated that she was done with therapy, thus session truncated by 11 minutes this date. Patient was left in lowered bed with call bell in reach and bed alarm set. SLP will continue to target goals per plan of care.       Pain Pain Assessment Pain Scale: 0-10 Pain Score: 0-No pain  Therapy/Group: Individual Therapy  Rosina Downy, M.A., CCC-SLP  Shifra Swartzentruber A Stellar Gensel 04/10/2023,  3:54 PM

## 2023-04-10 NOTE — Progress Notes (Signed)
 Physical Therapy Weekly Progress Note  Patient Details  Name: Jacqueline [name] MRN: [id number] Date of Birth: 2000-08-07  Beginning of progress report period: March 26, 2023 End of progress report period: April 10, 2023  Today's Date: 04/10/2023 PT Individual Time: 9084-8986 PT Individual Time Calculation (min): 58 min   Patient has met 2 of 3 short term goals. Pt demonstrates gradual progress towards long term goals. Pt is currently able to perform bed mobility using bed features with mod A for RLE management, stand/squat<>pivot transfers with mod A, ambulate up to 147ft with +2 assist and max A for RLE management, navigate 4 6in steps with L handrail and mod A +2, and perform WC mobility 181ft using R hemi technique and min/mod A. Pt continues to be limited by expressive > receptive aphasia, frustration/agitation, decreased participation/lack of motivation during therapy sessions, decreased awareness, R hemiparesis, and decreased standing balance/coordination. Pt will require hands on family education training prior to discharge but unsure of where pt will D/C to and who will be assisting pt. Custom WC evaluation scheduled for 2/11.  Patient continues to demonstrate the following deficits muscle weakness, decreased cardiorespiratoy endurance, impaired timing and sequencing, abnormal tone, unbalanced muscle activation, decreased coordination, and decreased motor planning, decreased awareness, decreased problem solving, and decreased safety awareness, and decreased sitting balance, decreased standing balance, decreased postural control, hemiplegia, and decreased balance strategies and therefore will continue to benefit from skilled PT intervention to increase functional independence with mobility.  Patient progressing toward long term goals..  Continue plan of care.  PT Short Term Goals Week 2:  PT Short Term Goal 1 (Week 2): pt will perform all aspects of bed mobility with mod A  consistantly PT Short Term Goal 1 - Progress (Week 2): Met PT Short Term Goal 2 (Week 2): pt will transfer bed<>chair with mod A of 1 consistantly PT Short Term Goal 2 - Progress (Week 2): Met PT Short Term Goal 3 (Week 2): pt will ambulate 59ft with LRAD and skilled max A of 1 PT Short Term Goal 3 - Progress (Week 2): Progressing toward goal Week 3:  PT Short Term Goal 1 (Week 3): pt will perform all aspects of bed mobility with min A consistantly PT Short Term Goal 2 (Week 3): pt will transfer bed<>chair with LRAD and min A consistantly PT Short Term Goal 3 (Week 3): pt will ambulate 53ft with LRAD and skilled max A of 1  Skilled Therapeutic Interventions/Progress Updates:  Ambulation/gait training;Discharge planning;Functional mobility training;Psychosocial support;Therapeutic Activities;Balance/vestibular training;Disease management/prevention;Neuromuscular re-education;Skin care/wound management;Therapeutic Exercise;Wheelchair propulsion/positioning;Cognitive remediation/compensation;DME/adaptive equipment instruction;Pain management;Splinting/orthotics;UE/LE Strength taining/ROM;Community reintegration;Functional electrical stimulation;Patient/family education;Stair training;UE/LE Coordination activities   Today's Interventions: Received pt semi-reclined in bed, pt agreeable to PT treatment, and did not c/o pain during session. Session with emphasis on functional mobility/transfers, dressing, generalized strengthening and endurance, dynamic standing balance/coordination, stair navigation, and gait training. Pt transferred semi-reclined<>sitting R EOB with HOB elevated and use of bedrails with mod A for RLE management. Donned socks and shoes with max A (+2 to get R foot into shoe). Donned pants with max A and stood from EOB with mod A and required +2 assist to pull pants over hips. Discussed with pt and Jacqueline [name] (facetime) recommendation to get larger size shoe or shoe with flexible tongue to allow  room to trial AFO - pt continued to refuse despite education on reasoning and how AFO can assist with independence with ambulation. Pt transferred bed<>WC to L via stand/squat<>pivot with mod A. Per Jacqueline [name], plan  for pt to D/C to his house or home with her sister with 4 STE Jacqueline [name] to confirm if there are handrails on stairs). Encouraged getting ramp installed, but pt resistive to both ramp and WC despite explanation of recommendation.   Pt transported to/from room in [facility] dependently for time management purposes. Stood at staircase from [facility] with min A using L handrail and navigated 4 6in steps with L handrail and mod/max A +2. Pt required total A to advance RLE up/down step, blocking from R knee hyperextending while ascending and bucking while descending. Pt verbalized that stairs were hard and again discussed recommendation to get temporary ramp installed - pt extremely resistive.   Applied red TB to RLE to assist with hip flexion, knee flexion, and DF and stood from [facility] with mod A while +2 secured TB and donned R shoe cover. Pt then ambulated 156ft (including 3 turns) with min/mod L HHA and max A fading to mod A (intermittent min A) to advance, place, and block R knee from hyperextension. Pt with hip flexor activation today and able to advance RLE, but required assist to step RLE through and for positioning/placement of R foot. Pt unable to activate hip extensors to step RLE back to [facility] when preparing to sit and continues to attempt to sit prematurely. Returned to room and pt reported urge to toilet - pt left sitting in WC left in care of NT for toileting due to time restrictions.   Therapy Documentation Precautions:  Precautions Precautions: Fall Precaution Comments: PEG, expressive aphasia with agitation, Rt hemi Restrictions Weight Bearing Restrictions Per Provider Order: No  Therapy/Group: Individual Therapy Therisa HERO Zaunegger Therisa Stains PT, DPT 04/10/2023, 6:37 AM

## 2023-04-10 NOTE — Progress Notes (Signed)
 PROGRESS NOTE   Subjective/Complaints: No new complaints this morning She asks about the letter for her apartment and discussed that SW will give this to her sister  ROS: Limited by aphasia, +right sided weakness, improved appetite, +menses with heavy flow, +right upper extremity pain   Objective:   No results found.   Recent Labs    04/08/23 1056  WBC 6.1  HGB 8.3*  HCT 24.0*  PLT 103*    Recent Labs    04/08/23 1056  NA 138  K 3.7  CL 112*  CO2 19*  GLUCOSE 88  BUN 9  CREATININE 0.95  CALCIUM  8.8*     Intake/Output Summary (Last 24 hours) at 04/10/2023 1030 Last data filed at 04/10/2023 0959 Gross per 24 hour  Intake 476 ml  Output --  Net 476 ml         Physical Exam: Vital Signs Blood pressure (!) 92/53, pulse 86, temperature 98.5 F (36.9 C), resp. rate 16, height 5' 4 (1.626 m), weight 72.9 kg, SpO2 100%, unknown if currently breastfeeding.   General: NAD, laying in bed, doesn't want to be bothered but more agreeable than yesterday  HEENT: Head is normocephalic, atraumatic, mucous membranes moist Neck: Supple without JVD or lymphadenopathy Heart: Reg rate and rhythm. No murmurs rubs or gallops Chest: CTA bilaterally without wheezes, rales, or rhonchi; no distress Abdomen: Positive bowel sounds, abdomen is soft, no TTP anywhere today, nondistended, G tube c/d/I-- not uncovered today.  Urostomy tube in place--not uncovered today Extremities: No clubbing, cyanosis, or edema. Pulses are 2+ Psych: Appears frustrated and irritable but calmer than yesterday Skin: Clean and intact without signs of breakdown over exposed surfaces, Gtube C/D/I Neuro: expressive aphasia, improving  MsK: RUE/RLE flaccid, moves LUE/LLE antigravity Musculoskeletal:     Cervical back: Normal range of motion and neck supple.     Comments: LUE/LLE moving well again gravity RUE- no voluntary movement seen of RUE or RLE    Neurological:     Comments: Patient came back from working with therapy.  Awake and alert.  She does make eye contact with examiner.  Increased verbal output today. MAS of 3-4 in R shoulder esp External rotation; MAS of 4 in R elbow- lacking 45 degrees of extension with a lot of work; MAS of 2-3 of R hand- in fist at rest, but could completely open fingers and get past wrist neutral.  Difficult to assess sensation, stable 2/6    Assessment/Plan: 1. Functional deficits which require 3+ hours per day of interdisciplinary therapy in a comprehensive inpatient rehab setting. Physiatrist is providing close team supervision and 24 hour management of active medical problems listed below. Physiatrist and rehab team continue to assess barriers to discharge/monitor patient progress toward functional and medical goals  Care Tool:  Bathing    Body parts bathed by patient: Right arm, Chest, Abdomen, Front perineal area, Buttocks, Right upper leg, Left upper leg, Left lower leg, Face   Body parts bathed by helper: Right lower leg, Left arm     Bathing assist Assist Level: Moderate Assistance - Patient 50 - 74%     Upper Body Dressing/Undressing Upper body dressing   What  is the patient wearing?: Pull over shirt    Upper body assist Assist Level: Minimal Assistance - Patient > 75%    Lower Body Dressing/Undressing Lower body dressing      What is the patient wearing?: Pants     Lower body assist Assist for lower body dressing: Moderate Assistance - Patient 50 - 74%     Toileting Toileting    Toileting assist Assist for toileting: Moderate Assistance - Patient 50 - 74%     Transfers Chair/bed transfer  Transfers assist  Chair/bed transfer activity did not occur: Safety/medical concerns (unsafe to get up)  Chair/bed transfer assist level: Moderate Assistance - Patient 50 - 74%     Locomotion Ambulation   Ambulation assist      Assist level: 2 helpers Assistive  device: Other (comment) (L HHA) Max distance: 149ft   Walk 10 feet activity   Assist     Assist level: 2 helpers Assistive device: Other (comment) (L HHA)   Walk 50 feet activity   Assist Walk 50 feet with 2 turns activity did not occur: Safety/medical concerns  Assist level: 2 helpers Assistive device: Other (comment) (L HHA)    Walk 150 feet activity   Assist Walk 150 feet activity did not occur: Safety/medical concerns         Walk 10 feet on uneven surface  activity   Assist Walk 10 feet on uneven surfaces activity did not occur: Safety/medical concerns         Wheelchair     Assist Is the patient using a wheelchair?: Yes Type of Wheelchair: Manual Wheelchair activity did not occur: Safety/medical concerns  Wheelchair assist level: Supervision/Verbal cueing Max wheelchair distance: 153ft    Wheelchair 50 feet with 2 turns activity    Assist    Wheelchair 50 feet with 2 turns activity did not occur: Safety/medical concerns   Assist Level: Supervision/Verbal cueing   Wheelchair 150 feet activity     Assist  Wheelchair 150 feet activity did not occur: Safety/medical concerns   Assist Level: Supervision/Verbal cueing   Blood pressure (!) 92/53, pulse 86, temperature 98.5 F (36.9 C), resp. rate 16, height 5' 4 (1.626 m), weight 72.9 kg, SpO2 100%, unknown if currently breastfeeding.   Medical Problem List and Plan: 1. 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  Antiphospholipid antibody syndrome: discussed that Eliquis  5mg  BID will help to reduce risk of clotting/strokes, continue  Discussed that Eliquis  will increase her menstrual bleeds  3. Pain Management: Neurontin  100 mg 3 times daily, topamax  d/ced given hyperchloremia, oxycodone  5 mg every 4 hours as needed -03/30/23 some tingling in R arm, positional? Manageable with meds. OT working on releasing scapula and helping with positioning; monitor -04/05/23 pain controlled, cont regimen  4. Anxiety: continue Klonopin  0.5 mg nightly, melatonin 5 mg nightly as needed -antipsychotic agents: suggest Depakote   or increase in gabapentin  to help calm pt down;  5. Neuropsych/cognition: This patient is capable of making decisions on her own behalf. 6. Skin/Wound Care: routine skin care for PEG and overall skin 7. Fluids/Electrolytes/Nutrition: will assess labs, monitor I&Os, cont supplements -03/29/23 K a little low 3.4, see #18; Na improved. BMP otherwise fairly unremarkable.  -03/30/23 K 4.0 today, BMP otherwise ok today, monitor -04/05/23 K 3.3, will give 60mEq today, monitor labs Monday  8.  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  Seen by oncology/hematology once in 2024-- needs to f/up  9.  Thrombocytopenia/iron  deficiency/folic acid  deficiency/B12 deficiency.  Follow-up CBC.  Continue Niferex 150mg  daily, and supplements  -04/05/23 Plt 90, stable, monitor   10.  Dysphagia.  Status post gastrostomy tube 03/21/2023 per interventional radiology.  Diet has been advanced to regular.  D/c TF. Discussed G-tube can be removed after 6-8 weeks from placement date  11.  E. coli UTI.  Antibiotic therapy completed 12.  Tobacco/alcohol use.  Counseling 13. Severe RUE spasticity- would  strongly suggest Botox of RUE soon as well as resting hand splint for R wrist and R PRAFO- will order.   14. Abd pain- from PEG vs constipation- if no BM, might need KUB.  -IR consult to assess Gtube site.  Pus noted at skin site. Appears CT scan is ordered -Likely G-tube infection.  She is going for a IR procedure today.  Hospitalist team following-appreciate assistance.  She has been started on vancomycin  and ceftriaxone  for abdominal infection.  Echocardiogram and cultures have been ordered.  Spoke with hospitalist today, ID has been consulted. -1/24 ID recommending vancomycin  for 7 days total and can use linezolid if discharged prior to this -03/29/23 appreciate hospitalist help, repeat BCx neg so far, ID signed off; looks like abd xray ordered, pending. Advancing diet as above.  -03/30/23 no abd pain today, other than menstrual cramping, has tylenol  if needed; abd xray yesterday fine. Hospitalist still following, no note yet today. Appreciate their assistance.  -04/05/23 still with abd cramping-- no significant tenderness; getting TVUS as below; monitor -04/06/23 no abd pain today, but LBM 04/03/23, cont miralax /colace BID but may need further intervention if no BM by tomorrow   15.  Elevated transaminases-mild, improved  Kpad ordered to minimize tylenol  use  16.  Hyponatremia: resolved, monitor Na weekly. Improved 04/05/23  17.  History of ileus: d/c feeding supplements and continue regular diet-- see #14 regarding constipation  18. Hypokalemia: klor 40 ordered 1/27, monitor potassium weekly-- see #7  19. Right sided wrist drop: wrist cock-up splint ordered, continue prn  20. Right sided ankle instability: aircast ordered, discussed with PT that it has been received, continue prn  21. Anemia/menorrhagia: discussed stool occult is positive, repeat ordered for today, CBC for today has not yet been drawn, chart reviewed and could be secondary to menses with started 1/31 -04/05/23 pt with dropping  hgb the entire week, heavy menses all week, Hgb down to 6.9 overnight with associated hypotension/tachycardia, got 2U PRBCs. VS improving/stable. Spoke with mom and updated her. Pt understands, too.  -Spoke with OBGYN on call Dr. Ilean who requested we get a TVUS and start Megace  40mg  BID, continue Eliquis , they will f/up with TVUS once it's done, may need to increase Megace  to 80mg  if heavy bleeding persists but for now start with 40mg  BID; monitor closely. F/up CBC after transfusion -04/06/23 post  transfusion Hgb 8.5, recheck tomorrow morning; asked that RNs document regarding menstrual flow and clots, to monitor how Megace  is helping; must monitor closely; pelvic US  done transabdominally only due to pt refusal, no abnormalities seen. Reach out to OBGYN if further assistance is needed  Monitor CBC weekly  22/ Hyperchloremia: may be 2/2 to dehydration from heavy menstrual bleeds, s/p IVF, d/c topamax   23. Hypotension: s/p IVF with improvement, continue to monitor BP TID      LOS: 16 days A FACE TO FACE EVALUATION WAS PERFORMED  Millard Bautch P Jerlyn Pain 04/10/2023, 10:30 AM

## 2023-04-10 NOTE — Progress Notes (Signed)
 Patient ID: Jacqueline [name], female   DOB: 28-Jul-2000, 23 y.o.   MRN: [id number] Spoke with pt and sister-[name] to try to figure out where the letter from the MD needs to be sent. Sister planing on coming today to visit pt and will look for it in pt's phone and get to worker. Also sister will sign servant center form so can assist with disability application. Pt reports her therapies are going well today and she is participating. Have called front desk again to spell sister's name right so will not have an issue when comes today to visit sister. Continue to work on discharge needs.

## 2023-04-10 NOTE — Progress Notes (Signed)
 Occupational Therapy Session Note  Patient Details  Name: Jacqueline [name] MRN: [id number] Date of Birth: March 17, 2000  Today's Date: 04/10/2023 OT Individual Time: 8954-8844 OT Individual Time Calculation (min): 70 min    Short Term Goals: Week 3:  OT Short Term Goal 1 (Week 3): Pt will complete 2/3 toileting steps with Min A for standing balance OT Short Term Goal 2 (Week 3): Pt will donn RUE resting hand splint with min cues OT Short Term Goal 3 (Week 3): Pt will tolerate WB through RUE during functional task for at least 2 mins with min A provided for UE stability  Skilled Therapeutic Interventions/Progress Updates:    Pt resting in bed upon arrival and agreeable to therapy. Supine>sit EOB with min A. Pt required assistance threading BLE into pants before standing to pull pants over hips. Mod A for pulling pants over hips. Stand pivot transfer to w/c with mod A and max verbal cues for technique and safety. Intital focus with standing balance in gym. Standing tasks to retrieve cards from card board. Support provided at Rt knee and standing with min A. Max verbal cues to select correct card. Sitting tasks seated placing cards in correct place on board. Table tasks with focus on scapula retraction/protraction with HOH assist. Saebo applied for wrist extension but pt unable to tolerate this morning. Wrist support placed back on RUE. Pt returned to room and transferred back to bed. Mod A to doff pants at bed level. Pt able to partially bridge to facilitate. Pt doffed socks withoout assistance. Pt reamined in bed with all needs within reach. Bed alarm activated.   Therapy Documentation Precautions:  Precautions Precautions: Fall Precaution Comments: PEG, expressive aphasia with agitation, Rt hemi Restrictions Weight Bearing Restrictions Per Provider Order: No    Pain: Pt c/o 4/10 RUE pain; repositioned with relief noted   Therapy/Group: Individual Therapy  [name] 04/10/2023, 12:05 PM

## 2023-04-11 NOTE — Progress Notes (Signed)
 Physical Therapy Session Note  Patient Details  Name: Jacqueline [name] MRN: [id number] Date of Birth: 03/17/2000  Today's Date: 04/11/2023 PT Individual Time: 0831-0927 PT Individual Time Calculation (min): 56 min   Short Term Goals: Week 2:  PT Short Term Goal 1 (Week 2): pt will perform all aspects of bed mobility with mod A consistantly PT Short Term Goal 1 - Progress (Week 2): Met PT Short Term Goal 2 (Week 2): pt will transfer bed<>chair with mod A of 1 consistantly PT Short Term Goal 2 - Progress (Week 2): Met PT Short Term Goal 3 (Week 2): pt will ambulate 9ft with LRAD and skilled max A of 1 PT Short Term Goal 3 - Progress (Week 2): Progressing toward goal Week 3:  PT Short Term Goal 1 (Week 3): pt will perform all aspects of bed mobility with min A consistantly PT Short Term Goal 2 (Week 3): pt will transfer bed<>chair with LRAD and min A consistantly PT Short Term Goal 3 (Week 3): pt will ambulate 33ft with LRAD and skilled max A of 1  Skilled Therapeutic Interventions/Progress Updates:   Received pt semi-reclined in bed asleep with covers pulled over her hear. Pr required max verbal, tactile, and environmental stimuli to arouse. Pt reluctantly agreeable to PT treatment and did not c/o pain during session. Session with emphasis on functional mobility/transfers, dressing, toileting, generalized strengthening and endurance, dynamic standing balance/coordination, and D/C planning. Pt transferred semi-reclined<>sitting R EOB with HOB slightly elevated and use of bedrails with mod A for RLE management. Donned non-skid socks and pt indicated need to toilet. Transferred bed<>WC<>toilet with bedside commode over top via stand<>pivot with mod A. Pt unable to hold urine telling therapist to hurry up - ultimately soiled brief. Removed soiled brief and pt continued to void. Stood multiple times from commode with mod A (blocking R knee) and pt performed hygiene management using LUE with min/mod A  for static standing balance. Donned clean brief with max A and transferred bedside commode<>WC via stand<>pivot with light mod A. Donned pants sitting in WC with max A and stood at sink with mod A and required mod A to pull pants over hips (total A to pull pants over hips on R side).   Donned socks and shoes with max A and RN arrived to administer medications and MD arrived for morning rounds. Pt expressing desire to D/C sooner than next Wednesday - discussed need to stay for custom WC evaluation next Tuesday and need for family education with sister or [name] Concluded session with pt sitting in [name], needs within reach, and seatbelt alarm on set up to eat breakfast. Plan to trial hemi walker in next session.   Therapy Documentation Precautions:  Precautions Precautions: Fall Precaution Comments: PEG, expressive aphasia with agitation, Rt hemi Restrictions Weight Bearing Restrictions Per Provider Order: No  Therapy/Group: Individual Therapy Therisa HERO Zaunegger Therisa Stains PT, DPT 04/11/2023, 6:54 AM

## 2023-04-11 NOTE — Progress Notes (Signed)
 PROGRESS NOTE   Subjective/Complaints: No new complaints this morning Boyfriend will be coming in for caregiver training on Monday, sister will also be present  ROS: Limited by aphasia, +right sided weakness, improved appetite, +menses with heavy flow, +right upper extremity pain   Objective:   No results found.   Recent Labs    04/10/23 1035  WBC 5.0  HGB 8.0*  HCT 23.6*  PLT 90*    No results for input(s): NA, K, CL, CO2, GLUCOSE, BUN, CREATININE, CALCIUM  in the last 72 hours.    Intake/Output Summary (Last 24 hours) at 04/11/2023 1121 Last data filed at 04/10/2023 2015 Gross per 24 hour  Intake 800 ml  Output --  Net 800 ml         Physical Exam: Vital Signs Blood pressure (!) 86/55, pulse 78, temperature 98.4 F (36.9 C), resp. rate 16, height 5' 4 (1.626 m), weight 72.9 kg, SpO2 98%, unknown if currently breastfeeding.   General: NAD, laying in bed, doesn't want to be bothered but more agreeable than yesterday  HEENT: Head is normocephalic, atraumatic, mucous membranes moist Neck: Supple without JVD or lymphadenopathy Heart: Reg rate and rhythm. No murmurs rubs or gallops Chest: CTA bilaterally without wheezes, rales, or rhonchi; no distress Abdomen: Positive bowel sounds, abdomen is soft, no TTP anywhere today, nondistended, G tube c/d/I-- not uncovered today.  Urostomy tube in place--not uncovered today Extremities: No clubbing, cyanosis, or edema. Pulses are 2+ Psych: Appears frustrated and irritable but calmer than yesterday Skin: Clean and intact without signs of breakdown over exposed surfaces, Gtube C/D/I Neuro: expressive aphasia, improving  MsK: RUE/RLE flaccid, moves LUE/LLE antigravity Musculoskeletal:     Cervical back: Normal range of motion and neck supple.     Comments: LUE/LLE moving well again gravity RUE- no voluntary movement seen of RUE or RLE   Neurological:      Comments: Patient came back from working with therapy.  Awake and alert.  She does make eye contact with examiner.  Increased verbal output today. MAS of 3 in R shoulder esp External rotation; MAS of 4 in R elbow- lacking 45 degrees of extension with a lot of work; MAS of 2-3 of R hand- in fist at rest, but could completely open fingers and get past wrist neutral.  Difficult to assess sensation, improved 2/7    Assessment/Plan: 1. Functional deficits which require 3+ hours per day of interdisciplinary therapy in a comprehensive inpatient rehab setting. Physiatrist is providing close team supervision and 24 hour management of active medical problems listed below. Physiatrist and rehab team continue to assess barriers to discharge/monitor patient progress toward functional and medical goals  Care Tool:  Bathing    Body parts bathed by patient: Right arm, Chest, Abdomen, Front perineal area, Buttocks, Right upper leg, Left upper leg, Left lower leg, Face   Body parts bathed by helper: Right lower leg, Left arm     Bathing assist Assist Level: Moderate Assistance - Patient 50 - 74%     Upper Body Dressing/Undressing Upper body dressing   What is the patient wearing?: Pull over shirt    Upper body assist Assist Level: Minimal Assistance -  Patient > 75%    Lower Body Dressing/Undressing Lower body dressing      What is the patient wearing?: Pants     Lower body assist Assist for lower body dressing: Moderate Assistance - Patient 50 - 74%     Toileting Toileting    Toileting assist Assist for toileting: Moderate Assistance - Patient 50 - 74%     Transfers Chair/bed transfer  Transfers assist  Chair/bed transfer activity did not occur: Safety/medical concerns (unsafe to get up)  Chair/bed transfer assist level: Moderate Assistance - Patient 50 - 74%     Locomotion Ambulation   Ambulation assist      Assist level: 2 helpers Assistive device: Other (comment) (L  HHA) Max distance: 165ft   Walk 10 feet activity   Assist     Assist level: 2 helpers Assistive device: Other (comment) (L HHA)   Walk 50 feet activity   Assist Walk 50 feet with 2 turns activity did not occur: Safety/medical concerns  Assist level: 2 helpers Assistive device: Other (comment) (L HHA)    Walk 150 feet activity   Assist Walk 150 feet activity did not occur: Safety/medical concerns         Walk 10 feet on uneven surface  activity   Assist Walk 10 feet on uneven surfaces activity did not occur: Safety/medical concerns         Wheelchair     Assist Is the patient using a wheelchair?: Yes Type of Wheelchair: Manual Wheelchair activity did not occur: Safety/medical concerns  Wheelchair assist level: Supervision/Verbal cueing Max wheelchair distance: 151ft    Wheelchair 50 feet with 2 turns activity    Assist    Wheelchair 50 feet with 2 turns activity did not occur: Safety/medical concerns   Assist Level: Supervision/Verbal cueing   Wheelchair 150 feet activity     Assist  Wheelchair 150 feet activity did not occur: Safety/medical concerns   Assist Level: Supervision/Verbal cueing   Blood pressure (!) 86/55, pulse 78, temperature 98.4 F (36.9 C), resp. rate 16, height 5' 4 (1.626 m), weight 72.9 kg, SpO2 98%, unknown if currently breastfeeding.   Medical Problem List and Plan: 1. 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  Antiphospholipid antibody syndrome: discussed that Eliquis  5mg  BID will help to reduce risk of clotting/strokes, continue  Discussed that Eliquis  will increase her menstrual bleeds  3. Pain Management: Neurontin  100 mg 3 times daily, topamax  d/ced given hyperchloremia, oxycodone  5 mg every 4 hours as needed -03/30/23 some tingling in R arm, positional? Manageable with meds. OT working on releasing scapula and helping with positioning; monitor -04/05/23 pain controlled, cont regimen  4. Anxiety: continue Klonopin  0.5 mg nightly, melatonin 5 mg nightly as needed -antipsychotic agents: suggest Depakote  or increase in gabapentin  to help calm pt down;  5. Neuropsych/cognition: This patient is capable of  making decisions on her own behalf. 6. Skin/Wound Care: routine skin care for PEG and overall skin 7. Fluids/Electrolytes/Nutrition: will assess labs, monitor I&Os, cont supplements -03/29/23 K a little low 3.4, see #18; Na improved. BMP otherwise fairly unremarkable.  -03/30/23 K 4.0 today, BMP otherwise ok today, monitor -04/05/23 K 3.3, will give 60mEq today, monitor labs Monday  8.  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  Seen by oncology/hematology once in 2024-- needs to f/up  9.  Thrombocytopenia/iron  deficiency/folic acid  deficiency/B12 deficiency.  Follow-up CBC.  Continue Niferex 150mg  daily, and supplements  -04/05/23 Plt 90, stable, monitor   10.  Dysphagia.  Status post gastrostomy tube 03/21/2023 per interventional radiology.  Diet has been advanced to regular.  D/c TF. Discussed G-tube can be removed after 6-8 weeks from placement date  11.  E. coli UTI.  Antibiotic therapy completed 12.  Tobacco/alcohol use.  Counseling 13. Severe RUE spasticity- would strongly  suggest Botox of RUE soon as well as resting hand splint for R wrist and R PRAFO- will order.   14. Abd pain- from PEG vs constipation- if no BM, might need KUB.  -IR consult to assess Gtube site.  Pus noted at skin site. Appears CT scan is ordered -Likely G-tube infection.  She is going for a IR procedure today.  Hospitalist team following-appreciate assistance.  She has been started on vancomycin  and ceftriaxone  for abdominal infection.  Echocardiogram and cultures have been ordered.  Spoke with hospitalist today, ID has been consulted. -1/24 ID recommending vancomycin  for 7 days total and can use linezolid if discharged prior to this -03/29/23 appreciate hospitalist help, repeat BCx neg so far, ID signed off; looks like abd xray ordered, pending. Advancing diet as above.  -03/30/23 no abd pain today, other than menstrual cramping, has tylenol  if needed; abd xray yesterday fine. Hospitalist still following, no note yet today. Appreciate their assistance.  -04/05/23 still with abd cramping-- no significant tenderness; getting TVUS as below; monitor -04/06/23 no abd pain today, but LBM 04/03/23, cont miralax /colace BID but may need further intervention if no BM by tomorrow   15.  Elevated transaminases-mild, improved  Kpad ordered to minimize tylenol  use  16.  Hyponatremia: resolved, monitor Na weekly. Improved 04/05/23  17.  History of ileus: d/c feeding supplements and continue regular diet-- see #14 regarding constipation  18. Hypokalemia: klor 40 ordered 1/27, monitor potassium weekly-- see #7  19. Right sided wrist drop: wrist cock-up splint ordered, conitnue prn  20. Right sided ankle instability: aircast ordered, discussed with PT that it has been received, continue prn  21. Anemia/menorrhagia: discussed stool occult is positive, repeat ordered for today, CBC for today has not yet been drawn, chart reviewed and could be secondary to menses with started 1/31 -04/05/23 pt with dropping hgb the  entire week, heavy menses all week, Hgb down to 6.9 overnight with associated hypotension/tachycardia, got 2U PRBCs. VS improving/stable. Spoke with mom and updated her. Pt understands, too.  -Spoke with OBGYN on call Dr. Ilean who requested we get a TVUS and start Megace  40mg  BID, continue Eliquis , they will f/up with TVUS once it's done, may need to increase Megace  to 80mg  if heavy bleeding persists but for now start with 40mg  BID; monitor closely. F/up CBC after transfusion -04/06/23 post transfusion Hgb 8.5, recheck tomorrow morning; asked that RNs document regarding menstrual flow and clots, to monitor  how Megace  is helping; must monitor closely; pelvic US  done transabdominally only due to pt refusal, no abnormalities seen. Reach out to OBGYN if further assistance is needed  Monitor CBC weekly  22/ Hyperchloremia: may be 2/2 to dehydration from heavy menstrual bleeds, s/p IVF, d/c topamax   23. Hypotension: s/p IVF with improvement, continue to monitor BP TID      LOS: 17 days A FACE TO FACE EVALUATION WAS PERFORMED  Quaid Yeakle P Griselle Rufer 04/11/2023, 11:21 AM

## 2023-04-11 NOTE — Progress Notes (Addendum)
 SLP Cancellation Note  Patient Details Name: Jacqueline [name] MRN: [id number] DOB: Jul 24, 2000   Cancelled treatment:        SLP attempted therapy session x2 this am.  Upon initial attempt, pt indicated frustration with situation outside of hospital. She verbalized not being up for it in reference to therapy session. SLP provided space and checked in about a half hour later.  Pt labile and again deferred therapy session for today. Pt missed 60 minutes to SLP session despite encouragement and multiple attempts.                                                                                                 Jacqueline [name] 04/11/2023, 10:11 AM

## 2023-04-11 NOTE — Progress Notes (Signed)
 Occupational Therapy Session Note  Patient Details  Name: Jacqueline [name] MRN: [id number] Date of Birth: [birth year]  Today's Date: 04/11/2023 OT Individual Time: 1420-1530 OT Individual Time Calculation (min): 70 min    Short Term Goals: Week 3:  OT Short Term Goal 1 (Week 3): Pt will complete 2/3 toileting steps with Min A for standing balance OT Short Term Goal 2 (Week 3): Pt will donn RUE resting hand splint with min cues OT Short Term Goal 3 (Week 3): Pt will tolerate WB through RUE during functional task for at least 2 mins with min A provided for UE stability  Skilled Therapeutic Interventions/Progress Updates:  Skilled OT intervention completed with focus on activity tolerance, functional transfers, toileting needs, R NMR/PROM. Pt received supine in bed, with blanket over her head asleep. Pt easily aroused, however attempted to ignore therapist stating no and recovered her head when OT removed the blanket. Pt required unreasonable amount of time to engage in session, with expressive aphasia difficulties but had clear expressions of being upset about therapy team recommendations for waiting for DC until pt's caregivers come in for training. Pt became emotional and perseverative on going home. Due to change in security clearance, OT redirected pt to going outside for change in scenery in which pt was agreeable.  Transitioned to EOB with supervision, with pt using LUE to advance her RLE. Donned grip socks dependently. Pt verbalized need for toileting. Utilized stedy for time conservation with supervision sit > stand in stedy and dependent transfers. Mod A needed to lower clothing, then pt immediately continent of urinary void only. Stood with supervision in stedy, completed pericare with CGA/min A for standing balance and mod A to donn clothing over hips. Stedy transfer > w/c.  Transported dependently in w/c <> outside courtyard. OT doffed RUE wrist cock up splint dependently and then  applied saebo to the R wrist extensors for NMR. OT assisted R wrist/digits with extension during the on phase due to slight radial abduction and inefficient extension due to tension in digits. During activity, pt initiated showing OT pictures of her kids, and sharing her desires for being home to care for her kids, however with continued decreased insight to deficits as we have discussed before that caring for them will look different with hemiplegia. Pt endorsed she does not desire to stay in IPR to get better to her fullest potential to care for them, she just wants to be home. Doffed saebo after 20 mins with skin intact.   Back in room, OT switched the cover of her RUE resting hand splint due to skin breakdown and maceration on the splint, and donned new splint to RUE for digit positioning at night as pt has repeatedly worn the wrist cock up splint despite education on difference and wear schedule. Mod A stand pivot > EOB. Min A bed mobility > supine for RLE. Pt remained semi upright in bed, with bed alarm on/activated, and with all needs in reach at end of session.   Therapy Documentation Precautions:  Precautions Precautions: Fall Precaution Comments: PEG, expressive aphasia with agitation, Rt hemi Restrictions Weight Bearing Restrictions Per Provider Order: No    Therapy/Group: Individual Therapy  Lorrayne FORBES Fritter, MS, OTR/L  04/11/2023, 3:45 PM

## 2023-04-11 NOTE — Progress Notes (Addendum)
 Patient ID: LACE CHENEVERT, female   DOB: 10-15-2000, 23 y.o.   MRN: [id number] Spoke with Max Sims-[phone] apartment production designer, theatre/television/film and he reports pt has been evicted due to rent was due 1/25 and none was paid. Will sent a letter from MD to confirm she has been in the hospital all for this time. He reports never heard from anyone regarding rent and plan so was taken to court and evicted. Reported had to secure the apartment. Will let sister know when here today and discuss need for family education in preparation for discharge. Pt insisting on going home next week. Omar-boyfriend to be here Monday 1:00-3:00 pm. Will see sister when here today  10:01 AM Met with Briant who ws here to have sign disability forms so can send referral and discuss pt will need care when she goes home. Sister with another sister on the phone feel she needs to stay to get better, aware pt is refusing therapies and can;t keep her if this continues. Sister's feel she needs to stay here and get better so an be there for her kids. Both [name] and Briant will be here Monday for education 1-3 pm  12:30 Pm According to MD pt can make her own decisions so can decide to take herself off of confidential so can go outside and more visitors can come in to see her.

## 2023-04-12 DIAGNOSIS — I959 Hypotension, unspecified: Secondary | ICD-10-CM

## 2023-04-12 NOTE — Plan of Care (Signed)
  Problem: RH PAIN MANAGEMENT Goal: RH STG PAIN MANAGED AT OR BELOW PT'S PAIN GOAL Description: <4 w/ prns Outcome: Progressing

## 2023-04-12 NOTE — Progress Notes (Signed)
 Occupational Therapy Session Note  Patient Details  Name: Jacqueline [name] MRN: [id number] Date of Birth: 17-Nov-2000  Today's Date: 04/12/2023 OT Individual Time: 9099-9054 OT Individual Time Calculation (min): 45 min    Short Term Goals: Week 1:  OT Short Term Goal 1 (Week 1): Pt will sit EOB for ADLs for > 3 mins with mod A for sitting balance OT Short Term Goal 1 - Progress (Week 1): Met OT Short Term Goal 2 (Week 1): Pt will complete functional toilet transfer with max A using LRAD OT Short Term Goal 2 - Progress (Week 1): Met OT Short Term Goal 3 (Week 1): Pt with attend to RUE during ADL task with min verbal cues during functional task OT Short Term Goal 3 - Progress (Week 1): Met OT Short Term Goal 4 (Week 1): Pt will donn LB clothing with max A using AE PRN OT Short Term Goal 4 - Progress (Week 1): Met  Skilled Therapeutic Interventions/Progress Updates:    Patient in bed at the time of arrival.  Patient in agreement with coming EOB to complete NMR.  The pt tolerated manual manipulation scapular and surrounding structures inclusive of more distal extremities the Forearm and hand to improve communication for incorporating as an assist.  The pt was able to transfer from supine in bed to EOB with MnA.  The pt was able to maintain good anatomical positioning for functional task completion. The pt was able to  complete AROM by threading both hands and ranging them into shld flexion and horizontal abduction,  2 sets of 10 with rest breaks as needed.  The pt required 2 rest breaks. The pt was a little sad today and continued to indicate that she wanted to go home, I reassured the patient that I understood her feelings and  I also encouraged the patient to remain committed to the therapeutic process to insure a more favorable outcome. I think it would be beneficial  for the pt to see her children in person, maybe this can be arrange with the family.  I feel it would really lift her spirits and  encourage her to remain committed. At the end of treatment, the pt returned to bed LOF with MinA and additional time with all additional needs addressed prior to exiting the room.   Therapy Documentation Precautions:  Precautions Precautions: Fall Precaution Comments: PEG, expressive aphasia with agitation, Rt hemi Restrictions Weight Bearing Restrictions Per Provider Order: No   Therapy/Group: Individual Therapy  [name] 04/12/2023, 4:11 PM

## 2023-04-12 NOTE — Plan of Care (Signed)
   Problem: Consults Goal: RH STROKE PATIENT EDUCATION Description: See Patient Education module for education specifics  Outcome: Progressing   Problem: RH BOWEL ELIMINATION Goal: RH STG MANAGE BOWEL WITH ASSISTANCE Description: STG Manage Bowel with medications with minimal Assistance. Outcome: Progressing   Problem: RH BLADDER ELIMINATION Goal: RH STG MANAGE BLADDER WITH ASSISTANCE Description: STG Manage Bladder with minimal Assistance Outcome: Progressing   Problem: RH SAFETY Goal: RH STG ADHERE TO SAFETY PRECAUTIONS W/ASSISTANCE/DEVICE Description: STG Adhere to Safety Precautions with min A Outcome: Progressing   Problem: RH PAIN MANAGEMENT Goal: RH STG PAIN MANAGED AT OR BELOW PT'S PAIN GOAL Description: <4w/ prns Outcome: Progressing   Problem: RH KNOWLEDGE DEFICIT Goal: RH STG INCREASE KNOWLEDGE OF STROKE PROPHYLAXIS Description: Manage stroke prophylaxis with min A Outcome: Progressing   Problem: RH BOWEL ELIMINATION Goal: RH STG MANAGE BOWEL W/MEDICATION W/ASSISTANCE Description: STG Manage Bowel with Medication with Assistance. Outcome: Progressing

## 2023-04-12 NOTE — Progress Notes (Signed)
 Physical Therapy Session Note  Patient Details  Name: Jacqueline [name] MRN: [id number] Date of Birth: Jan 10, 2001  Today's Date: 04/12/2023 PT Individual Time: 1030-1144 PT Individual Time Calculation (min): 74 min   Short Term Goals: Week 2:  PT Short Term Goal 1 (Week 2): pt will perform all aspects of bed mobility with mod A consistantly PT Short Term Goal 1 - Progress (Week 2): Met PT Short Term Goal 2 (Week 2): pt will transfer bed<>chair with mod A of 1 consistantly PT Short Term Goal 2 - Progress (Week 2): Met PT Short Term Goal 3 (Week 2): pt will ambulate 59ft with LRAD and skilled max A of 1 PT Short Term Goal 3 - Progress (Week 2): Progressing toward goal Week 3:  PT Short Term Goal 1 (Week 3): pt will perform all aspects of bed mobility with min A consistantly PT Short Term Goal 2 (Week 3): pt will transfer bed<>chair with LRAD and min A consistantly PT Short Term Goal 3 (Week 3): pt will ambulate 29ft with LRAD and skilled max A of 1  Skilled Therapeutic Interventions/Progress Updates:   Received pt semi-reclined in bed, pt agreeable to PT treatment, and c/o pain in stomach. Session with emphasis on functional mobility/transfers, toileting, dressing, generalized strengthening and endurance, dynamic standing balance/coordination, and gait training. Pt quickly transferred semi-reclined<>sitting R EOB with HOB elevated and use of bedrails with mod A for RLE management. Pt reported urge to toilet and transferred bed<>WC<>to/from toilet with bedside commode over top with light mod A. Removed brief and pt able to void and have BM. Stood from commode with min A and pt able to perform hygiene management in standing with min A for balance. Donned clean brief in standing with max A, then donned pants, socks, and shoes with max A. Pt stood from [facility] at sink with min A and required mod A to pull pants over hips on R side.   Pt attempting numerous times to call sister and indicating desire to go  home. Reminded pt that sister and Jacqueline [name] to come in for family education on Monday and then plan for custom WC evaluation on Tuesday. Pt demo poor insight into deficits, wanting to leave Monday regardless of if she has a custom WC or not. Reinforced to pt not to ambulate at home and ONLY with skilled therapist - pt verbalized understanding. Also discussed need to educate Jacqueline [name]/sister on bumping up/down steps in [facility] due to need for skilled mod A +2 for stair navigation at this time - again, pt verbalized understanding. Pt transported to/from room in [facility] dependently for time management purposes. Pt turned on music and donned shoe cover to R foot and tied red TB around RLE to facilitated hip flexion, knee flexion, and DF. Provided pt with hemi walker and pt stood from [facility] with min A x 2 trials (cues to push up from [facility]) and ambulated the following distance with hemi walker with cues for sequencing/stepping: Trial 1: 146ft with mod A of 1 and +2 for close WC follow. Pt able to advance RLE but with difficulty stepping all the way through and tendency to adduct and IR RLE. Pt required heavy min A to prevent R knee hyperextension, fully advance RLE, and prevent R ankle from rolling. Trial 2: 123ft with mod A of 1! Same assist as above but pt demonstrating improvements in sequencing with turning and stepping (especially backwards) and much more receptive and adherent to therapist's cues today. Pt smiling and proud of  herself for progress with ambulation.  Returned to room and pt refused to remain in [facility] - stand<>pivot WC<>bed to L with min A and sit<>supine with mod A for RLE management. Concluded session with pt semi-reclined in bed, needs within reach, and bed alarm on. NT at bedside attending to care.   Therapy Documentation Precautions:  Precautions Precautions: Fall Precaution Comments: PEG, expressive aphasia with agitation, Rt hemi Restrictions Weight Bearing Restrictions Per Provider Order: No  Therapy/Group:  Individual Therapy Jacqueline [name] Jacqueline [name] PT, DPT 04/12/2023, 6:58 AM

## 2023-04-12 NOTE — Progress Notes (Addendum)
 PROGRESS NOTE   Subjective/Complaints:  Pt doing ok, slept well, denies pain today, LBM today per pt but none documented since 04/09/23, urinating ok, states vaginal bleeding is done? No notes in the computer about it. Denies any other complaints or concerns except for having questions about d/c planning and family training.   ROS: Limited by aphasia, +right sided weakness, improved appetite, +menses with heavy flow--?resolved, +right upper extremity pain   Objective:   No results found.   Recent Labs    04/10/23 1035  WBC 5.0  HGB 8.0*  HCT 23.6*  PLT 90*    No results for input(s): NA, K, CL, CO2, GLUCOSE, BUN, CREATININE, CALCIUM  in the last 72 hours.    Intake/Output Summary (Last 24 hours) at 04/12/2023 1202 Last data filed at 04/12/2023 0745 Gross per 24 hour  Intake 656 ml  Output --  Net 656 ml         Physical Exam: Vital Signs Blood pressure (!) 90/46, pulse 79, temperature 98.6 F (37 C), resp. rate 18, height 5' 4 (1.626 m), weight 72.9 kg, SpO2 100%, unknown if currently breastfeeding.   General: NAD, sitting at EOB working with OT  HEENT: Head is normocephalic, atraumatic, mucous membranes moist Neck: Supple without JVD or lymphadenopathy Heart: Reg rate and rhythm. No murmurs rubs or gallops Chest: CTA bilaterally without wheezes, rales, or rhonchi; no distress Abdomen: Positive bowel sounds, abdomen is soft, nonTTP, G tube c/d/I.  Urostomy tube in place--not uncovered today Extremities: No clubbing, cyanosis, or edema. Pulses are 2+ Psych: calmer today than past visits Skin: Clean and intact without signs of breakdown over exposed surfaces, Gtube C/D/I Neuro: expressive aphasia, improving  MsK: RUE/RLE weakness stable but seen moving fingers today! moves LUE/LLE antigravity  PRIOR EXAMS: Musculoskeletal:     Cervical back: Normal range of motion and neck supple.      Comments: LUE/LLE moving well again gravity RUE- no voluntary movement seen of RUE or RLE   Neurological:     Comments: Patient came back from working with therapy.  Awake and alert.  She does make eye contact with examiner.  Increased verbal output today. MAS of 3 in R shoulder esp External rotation; MAS of 4 in R elbow- lacking 45 degrees of extension with a lot of work; MAS of 2-3 of R hand- in fist at rest, but could completely open fingers and get past wrist neutral.  Difficult to assess sensation, improved 2/7    Assessment/Plan: 1. Functional deficits which require 3+ hours per day of interdisciplinary therapy in a comprehensive inpatient rehab setting. Physiatrist is providing close team supervision and 24 hour management of active medical problems listed below. Physiatrist and rehab team continue to assess barriers to discharge/monitor patient progress toward functional and medical goals  Care Tool:  Bathing    Body parts bathed by patient: Right arm, Chest, Abdomen, Front perineal area, Buttocks, Right upper leg, Left upper leg, Left lower leg, Face   Body parts bathed by helper: Right lower leg, Left arm     Bathing assist Assist Level: Moderate Assistance - Patient 50 - 74%     Upper Body Dressing/Undressing Upper body dressing  What is the patient wearing?: Pull over shirt    Upper body assist Assist Level: Minimal Assistance - Patient > 75%    Lower Body Dressing/Undressing Lower body dressing      What is the patient wearing?: Pants     Lower body assist Assist for lower body dressing: Moderate Assistance - Patient 50 - 74%     Toileting Toileting    Toileting assist Assist for toileting: Moderate Assistance - Patient 50 - 74%     Transfers Chair/bed transfer  Transfers assist  Chair/bed transfer activity did not occur: Safety/medical concerns (unsafe to get up)  Chair/bed transfer assist level: Moderate Assistance - Patient 50 - 74%      Locomotion Ambulation   Ambulation assist      Assist level: Moderate Assistance - Patient 50 - 74% Assistive device: Walker-hemi Max distance: 147ft   Walk 10 feet activity   Assist     Assist level: Moderate Assistance - Patient - 50 - 74% Assistive device: Walker-hemi   Walk 50 feet activity   Assist Walk 50 feet with 2 turns activity did not occur: Safety/medical concerns  Assist level: Moderate Assistance - Patient - 50 - 74% Assistive device: Walker-hemi    Walk 150 feet activity   Assist Walk 150 feet activity did not occur: Safety/medical concerns         Walk 10 feet on uneven surface  activity   Assist Walk 10 feet on uneven surfaces activity did not occur: Safety/medical concerns         Wheelchair     Assist Is the patient using a wheelchair?: Yes Type of Wheelchair: Manual Wheelchair activity did not occur: Safety/medical concerns  Wheelchair assist level: Supervision/Verbal cueing Max wheelchair distance: 16ft    Wheelchair 50 feet with 2 turns activity    Assist    Wheelchair 50 feet with 2 turns activity did not occur: Safety/medical concerns   Assist Level: Supervision/Verbal cueing   Wheelchair 150 feet activity     Assist  Wheelchair 150 feet activity did not occur: Safety/medical concerns   Assist Level: Supervision/Verbal cueing   Blood pressure (!) 90/46, pulse 79, temperature 98.6 F (37 C), resp. rate 18, height 5' 4 (1.626 m), weight 72.9 kg, SpO2 100%, unknown if currently breastfeeding.   Medical Problem List and Plan: 1. 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  Antiphospholipid antibody syndrome: discussed that Eliquis  5mg  BID will help to reduce risk of clotting/strokes, continue  Discussed that Eliquis  will increase her menstrual bleeds  3. Pain Management: Neurontin  100 mg 3 times daily, topamax  d/ced given hyperchloremia, oxycodone  5 mg every 4 hours as needed -03/30/23 some tingling in R arm, positional? Manageable with meds. OT working on releasing scapula and helping with positioning; monitor -04/05/23 pain controlled, cont regimen  4. Anxiety: continue Klonopin  0.5 mg nightly,  melatonin 5 mg nightly as needed -antipsychotic agents: suggest Depakote  or increase in gabapentin  to help calm pt down;  5. Neuropsych/cognition: This patient is capable of making decisions on her own behalf. 6. Skin/Wound Care: routine skin care for PEG and overall skin 7. Fluids/Electrolytes/Nutrition: will assess labs, monitor I&Os, cont supplements -03/29/23 K a little low 3.4, see #18; Na improved. BMP otherwise fairly unremarkable.  -03/30/23 K 4.0 today, BMP otherwise ok today, monitor -04/05/23 K 3.3, will give 60mEq today, monitor labs Monday  8.  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  Seen by oncology/hematology once in 2024-- needs to f/up  9.  Thrombocytopenia/iron  deficiency/folic acid  deficiency/B12 deficiency.  Follow-up CBC.  Continue Niferex 150mg  daily, and supplements  -04/05/23 Plt 90, stable, monitor   10.  Dysphagia.  Status post gastrostomy tube 03/21/2023 per interventional radiology.  Diet has been advanced to regular.  D/c TF. Discussed G-tube can be removed  after 6-8 weeks from placement date-- discussed this again on 04/12/23, pt antsy to remove it. Discuss this again with her during d/c planning.   11.  E. coli UTI.  Antibiotic therapy completed 12.  Tobacco/alcohol use.  Counseling 13. Severe RUE spasticity- would strongly suggest Botox of RUE soon as well as resting hand splint for R wrist and R PRAFO- will order.   14. Abd pain- from PEG vs constipation- if no BM, might need KUB.  -IR consult to assess Gtube site.  Pus noted at skin site. Appears CT scan is ordered -Likely G-tube infection.  She is going for a IR procedure today.  Hospitalist team following-appreciate assistance.  She has been started on vancomycin  and ceftriaxone  for abdominal infection.  Echocardiogram and cultures have been ordered.  Spoke with hospitalist today, ID has been consulted. -1/24 ID recommending vancomycin  for 7 days total and can use linezolid if discharged prior to this -03/29/23 appreciate hospitalist help, repeat BCx neg so far, ID signed off; looks like abd xray ordered, pending. Advancing diet as above.  -03/30/23 no abd pain today, other than menstrual cramping, has tylenol  if needed; abd xray yesterday fine. Hospitalist still following, no note yet today. Appreciate their assistance.  -04/05/23 still with abd cramping-- no significant tenderness; getting TVUS as below; monitor -04/06/23 no abd pain today, but LBM 04/03/23, cont miralax /colace BID but may need further intervention if no BM by tomorrow -04/12/23 LBM ?2/5 or today (per pt, just not documented); monitor for now, but if no BM by tomorrow, might want to further adjust meds. Refusing some meds intermittently...   15.  Elevated transaminases-mild, improved  Kpad ordered to minimize tylenol  use  16.  Hyponatremia: resolved, monitor Na weekly. Improved 04/05/23  17.  History of ileus: d/c feeding supplements and continue regular diet-- see #14 regarding constipation  18. Hypokalemia: klor 40 ordered 1/27,  monitor potassium weekly-- see #7  19. Right sided wrist drop: wrist cock-up splint ordered, conitnue prn  20. Right sided ankle instability: aircast ordered, discussed with PT that it has been received, continue prn  21. Anemia/menorrhagia: discussed stool occult is positive, repeat ordered for today, CBC for today has not yet been drawn, chart reviewed and could be secondary to menses with started 1/31 -04/05/23 pt with dropping hgb the entire week, heavy menses all week, Hgb down to 6.9 overnight with associated hypotension/tachycardia, got 2U PRBCs. VS improving/stable. Spoke with mom and updated her.  Pt understands, too.  -Spoke with OBGYN on call Dr. Ilean who requested we get a TVUS and start Megace  40mg  BID, continue Eliquis , they will f/up with TVUS once it's done, may need to increase Megace  to 80mg  if heavy bleeding persists but for now start with 40mg  BID; monitor closely. F/up CBC after transfusion -04/06/23 post transfusion Hgb 8.5, recheck tomorrow morning; asked that RNs document regarding menstrual flow and clots, to monitor how Megace  is helping; must monitor closely; pelvic US  done transabdominally only due to pt refusal, no abnormalities seen. Reach out to OBGYN if further assistance is needed Monitor CBC weekly -04/12/23 pt states her menses stopped, no documentation anywhere about this-- weekday team might want to touch base with OBGYN to see what the plan for her Megace  is... -Hgb 8.0 on 2/6, bouncing around this week, monitor on Monday but have a low threshold for recheck   22/ Hyperchloremia: may be 2/2 to dehydration from heavy menstrual bleeds, s/p IVF, d/c topamax   23. Hypotension: s/p IVF with improvement, continue to monitor BP TID  -04/12/23 BP soft but stable, likely her baseline; monitor.  Vitals:   04/07/23 1800 04/07/23 1900 04/08/23 0626 04/08/23 1630  BP: (!) 88/49 (!) 90/57 (!) 91/52 (!) 86/53   04/08/23 1937 04/09/23 1516 04/09/23 2004 04/10/23 0438  BP: (!)  95/57 96/63 98/62  (!) 92/53   04/10/23 1504 04/10/23 2013 04/11/23 0525 04/12/23 0544  BP: (!) 102/49 (!) 106/58 (!) 86/55 (!) 90/46         LOS: 18 days A FACE TO FACE EVALUATION WAS PERFORMED  [place] 04/12/2023, 12:02 PM

## 2023-04-12 NOTE — Progress Notes (Signed)
 Speech Language Pathology Missed Visit Note  Patient Details  Name: Jacqueline [name] MRN: [id number] Date of Birth: 07-11-2000 Today's Date: 04/12/2023  SLP attempted therapy x2 this date. Pt received up in wheelchair with family member in room and immediately refused, stating no when SLP entered. SLP suggested trying again in 15 minutes and patient was agreeable. SLP returned 15 min later and patient and family member off the unit. Nursing made aware. Missed 45 minutes of therapy d/t refusal then off unit.  [name] 04/12/2023, 2:40 PM

## 2023-04-13 NOTE — Plan of Care (Signed)
   Problem: Consults Goal: RH STROKE PATIENT EDUCATION Description: See Patient Education module for education specifics  Outcome: Progressing   Problem: RH BOWEL ELIMINATION Goal: RH STG MANAGE BOWEL WITH ASSISTANCE Description: STG Manage Bowel with medications with minimal Assistance. Outcome: Progressing   Problem: RH BLADDER ELIMINATION Goal: RH STG MANAGE BLADDER WITH ASSISTANCE Description: STG Manage Bladder with minimal Assistance Outcome: Progressing   Problem: RH SAFETY Goal: RH STG ADHERE TO SAFETY PRECAUTIONS W/ASSISTANCE/DEVICE Description: STG Adhere to Safety Precautions with min A Outcome: Progressing   Problem: RH PAIN MANAGEMENT Goal: RH STG PAIN MANAGED AT OR BELOW PT'S PAIN GOAL Description: <4w/ prns Outcome: Progressing   Problem: RH KNOWLEDGE DEFICIT Goal: RH STG INCREASE KNOWLEDGE OF STROKE PROPHYLAXIS Description: Manage stroke prophylaxis with min A Outcome: Progressing   Problem: RH BOWEL ELIMINATION Goal: RH STG MANAGE BOWEL W/MEDICATION W/ASSISTANCE Description: STG Manage Bowel with Medication with Assistance. Outcome: Progressing

## 2023-04-13 NOTE — Progress Notes (Signed)
 Patient called x3 for pain meds while this RN was in another patient's room, no other RN available to to pass meds. RN came to pyxis to pull meds for said patient and she was at the desk with boyfriend signing out for a grounds pass. RN asked the patient if she wanted medication and patient refused. Will administer medication when patient returns to unit.

## 2023-04-13 NOTE — Progress Notes (Signed)
 Patient returned to unit at approx 1100;this RN was able to administer morning meds

## 2023-04-13 NOTE — Progress Notes (Signed)
 PROGRESS NOTE   Subjective/Complaints:  Pt doing well, slept well, denies pain, LBM this morning per pt and boyfriend, but still none documented since 04/09/23, urinating ok, still endorses that menses has finished. Still no notes in the computer about it. Denies any other complaints or concerns.   ROS: Limited by aphasia, +right sided weakness, improved appetite, +menses with heavy flow--?resolved, +right upper extremity pain   Objective:   No results found.   No results for input(s): WBC, HGB, HCT, PLT in the last 72 hours.   No results for input(s): NA, K, CL, CO2, GLUCOSE, BUN, CREATININE, CALCIUM  in the last 72 hours.   No intake or output data in the 24 hours ending 04/13/23 1114        Physical Exam: Vital Signs Blood pressure (!) 95/54, pulse 81, temperature 98.3 F (36.8 C), temperature source Oral, resp. rate 20, height 5' 4 (1.626 m), weight 72.9 kg, SpO2 99%, unknown if currently breastfeeding.   General: NAD, laying in bed with boyfriend present HEENT: Head is normocephalic, atraumatic, mucous membranes moist Neck: Supple without JVD or lymphadenopathy Heart: Reg rate and rhythm. No murmurs rubs or gallops Chest: CTA bilaterally without wheezes, rales, or rhonchi; no distress Abdomen: Positive bowel sounds, abdomen is soft, nonTTP, G tube c/d/I.   Extremities: No clubbing, cyanosis, or edema. Pulses are 2+ Psych: calmer today than past visits Skin: Clean and intact without signs of breakdown over exposed surfaces, Gtube C/D/I Neuro: expressive aphasia, improving  MsK: RUE/RLE weakness stable but seen moving fingers 2/8! moves LUE/LLE antigravity  PRIOR EXAMS: Musculoskeletal:     Cervical back: Normal range of motion and neck supple.     Comments: LUE/LLE moving well again gravity RUE- no voluntary movement seen of RUE or RLE   Neurological:     Comments: Patient came back  from working with therapy.  Awake and alert.  She does make eye contact with examiner.  Increased verbal output today. MAS of 3 in R shoulder esp External rotation; MAS of 4 in R elbow- lacking 45 degrees of extension with a lot of work; MAS of 2-3 of R hand- in fist at rest, but could completely open fingers and get past wrist neutral.  Difficult to assess sensation, improved 2/7    Assessment/Plan: 1. Functional deficits which require 3+ hours per day of interdisciplinary therapy in a comprehensive inpatient rehab setting. Physiatrist is providing close team supervision and 24 hour management of active medical problems listed below. Physiatrist and rehab team continue to assess barriers to discharge/monitor patient progress toward functional and medical goals  Care Tool:  Bathing    Body parts bathed by patient: Right arm, Chest, Abdomen, Front perineal area, Buttocks, Right upper leg, Left upper leg, Left lower leg, Face   Body parts bathed by helper: Right lower leg, Left arm     Bathing assist Assist Level: Moderate Assistance - Patient 50 - 74%     Upper Body Dressing/Undressing Upper body dressing   What is the patient wearing?: Pull over shirt    Upper body assist Assist Level: Minimal Assistance - Patient > 75%    Lower Body Dressing/Undressing Lower body dressing  What is the patient wearing?: Pants     Lower body assist Assist for lower body dressing: Moderate Assistance - Patient 50 - 74%     Toileting Toileting    Toileting assist Assist for toileting: Moderate Assistance - Patient 50 - 74%     Transfers Chair/bed transfer  Transfers assist  Chair/bed transfer activity did not occur: Safety/medical concerns (unsafe to get up)  Chair/bed transfer assist level: Moderate Assistance - Patient 50 - 74%     Locomotion Ambulation   Ambulation assist      Assist level: Moderate Assistance - Patient 50 - 74% Assistive device: Walker-hemi Max  distance: 19ft   Walk 10 feet activity   Assist     Assist level: Moderate Assistance - Patient - 50 - 74% Assistive device: Walker-hemi   Walk 50 feet activity   Assist Walk 50 feet with 2 turns activity did not occur: Safety/medical concerns  Assist level: Moderate Assistance - Patient - 50 - 74% Assistive device: Walker-hemi    Walk 150 feet activity   Assist Walk 150 feet activity did not occur: Safety/medical concerns         Walk 10 feet on uneven surface  activity   Assist Walk 10 feet on uneven surfaces activity did not occur: Safety/medical concerns         Wheelchair     Assist Is the patient using a wheelchair?: Yes Type of Wheelchair: Manual Wheelchair activity did not occur: Safety/medical concerns  Wheelchair assist level: Supervision/Verbal cueing Max wheelchair distance: 13ft    Wheelchair 50 feet with 2 turns activity    Assist    Wheelchair 50 feet with 2 turns activity did not occur: Safety/medical concerns   Assist Level: Supervision/Verbal cueing   Wheelchair 150 feet activity     Assist  Wheelchair 150 feet activity did not occur: Safety/medical concerns   Assist Level: Supervision/Verbal cueing   Blood pressure (!) 95/54, pulse 81, temperature 98.3 F (36.8 C), temperature source Oral, resp. rate 20, height 5' 4 (1.626 m), weight 72.9 kg, SpO2 99%, unknown if currently breastfeeding.   Medical Problem List and Plan: 1. 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  Antiphospholipid antibody syndrome: discussed that Eliquis  5mg  BID will help to reduce risk of clotting/strokes, continue  Discussed that Eliquis  will increase her menstrual bleeds  3. Pain Management: Neurontin  100 mg 3 times daily, topamax  d/ced given hyperchloremia, oxycodone  5 mg every 4 hours as needed -03/30/23 some tingling in R arm, positional? Manageable with meds. OT working on releasing scapula and helping with positioning; monitor -04/05/23 pain controlled, cont regimen  4. Anxiety: continue Klonopin  0.5 mg nightly, melatonin 5 mg nightly as needed -antipsychotic agents: suggest Depakote  or increase in gabapentin  to help calm pt down;  5. Neuropsych/cognition: This patient is capable of making decisions on her own behalf.  6. Skin/Wound Care: routine skin care for PEG and overall skin 7. Fluids/Electrolytes/Nutrition: will assess labs, monitor I&Os, cont supplements -03/29/23 K a little low 3.4, see #18; Na improved. BMP otherwise fairly unremarkable.  -03/30/23 K 4.0 today, BMP otherwise ok today, monitor -04/05/23 K 3.3, will give 60mEq today, monitor labs Monday  8.  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  Seen by oncology/hematology once in 2024-- needs to f/up  9.  Thrombocytopenia/iron  deficiency/folic acid  deficiency/B12 deficiency.  Follow-up CBC.  Continue Niferex 150mg  daily, and supplements  -04/05/23 Plt 90, stable, monitor   10.  Dysphagia.  Status post gastrostomy tube 03/21/2023 per interventional radiology.  Diet has been advanced to regular.  D/c TF. Discussed G-tube can be removed after 6-8 weeks from placement date-- discussed this again on 04/12/23, pt antsy to remove it. Discuss this again with her  during d/c planning.   11.  E. coli UTI.  Antibiotic therapy completed 12.  Tobacco/alcohol use.  Counseling 13. Severe RUE spasticity- would strongly suggest Botox of RUE soon as well as resting hand splint for R wrist and R PRAFO- will order.   14. Abd pain- from PEG vs constipation- if no BM, might need KUB.  -IR consult to assess Gtube site.  Pus noted at skin site. Appears CT scan is ordered -Likely G-tube infection.  She is going for a IR procedure today.  Hospitalist team following-appreciate assistance.  She has been started on vancomycin  and ceftriaxone  for abdominal infection.  Echocardiogram and cultures have been ordered.  Spoke with hospitalist today, ID has been consulted. -1/24 ID recommending vancomycin  for 7 days total and can use linezolid if discharged prior to this -03/29/23 appreciate hospitalist help, repeat BCx neg so far, ID signed off; looks like abd xray ordered, pending. Advancing diet as above.  -03/30/23 no abd pain today, other than menstrual cramping, has tylenol  if needed; abd xray yesterday fine. Hospitalist still following, no note yet today. Appreciate their assistance.  -04/05/23 still with abd cramping-- no significant tenderness; getting TVUS as below; monitor -04/06/23 no abd pain today, but LBM 04/03/23, cont miralax /colace BID but may need further intervention if no BM by tomorrow -04/12/23 LBM ?2/5 or today (per pt, just not documented); monitor for now, but if no BM by tomorrow, might want to further adjust meds. Refusing some meds intermittently... -04/13/23 LBM this morning according to pt AND boyfriend who's with her; none documented though; hard to know what to believe, but boyfriend corroborating that her LBM was today makes me think it's accurate... so monitor for now.    15.  Elevated transaminases-mild, improved  Kpad ordered to minimize tylenol  use  16.  Hyponatremia: resolved, monitor Na weekly. Improved 04/05/23  17.  History of ileus: d/c feeding  supplements and continue regular diet-- see #14 regarding constipation  18. Hypokalemia: klor 40 ordered 1/27, monitor potassium weekly-- see #7  19. Right sided wrist drop: wrist cock-up splint ordered, conitnue prn  20. Right sided ankle instability: aircast ordered, discussed with PT that it has been received, continue prn  21. Anemia/menorrhagia: discussed stool occult is positive, repeat ordered for today, CBC for today has not yet been drawn, chart reviewed and could be secondary to menses with started 1/31 -04/05/23 pt with dropping hgb the entire week, heavy menses all week, Hgb down to 6.9 overnight with associated hypotension/tachycardia, got 2U PRBCs. VS improving/stable.  Spoke with mom and updated her. Pt understands, too.  -Spoke with OBGYN on call Dr. Ilean who requested we get a TVUS and start Megace  40mg  BID, continue Eliquis , they will f/up with TVUS once it's done, may need to increase Megace  to 80mg  if heavy bleeding persists but for now start with 40mg  BID; monitor closely. F/up CBC after transfusion -04/06/23 post transfusion Hgb 8.5, recheck tomorrow morning; asked that RNs document regarding menstrual flow and clots, to monitor how Megace  is helping; must monitor closely; pelvic US  done transabdominally only due to pt refusal, no abnormalities seen. Reach out to OBGYN if further assistance is needed Monitor CBC weekly -04/12/23 pt states her menses stopped, no documentation anywhere about this-- weekday team might want to touch base with OBGYN to see what the plan for her Megace  is... -Hgb 8.0 on 2/6, bouncing around this week, monitor on Monday but have a low threshold for recheck   22/ Hyperchloremia: may be 2/2 to dehydration from heavy menstrual bleeds, s/p IVF, d/c topamax   23. Hypotension: s/p IVF with improvement, continue to monitor BP TID  -2/8-9/25 BP soft but stable, likely her baseline; monitor.  Vitals:   04/08/23 1630 04/08/23 1937 04/09/23 1516 04/09/23 2004   BP: (!) 86/53 (!) 95/57 96/63 98/62    04/10/23 0438 04/10/23 1504 04/10/23 2013 04/11/23 0525  BP: (!) 92/53 (!) 102/49 (!) 106/58 (!) 86/55   04/12/23 0544 04/12/23 1409 04/12/23 1959 04/13/23 0526  BP: (!) 90/46 (!) 99/59 (!) 91/49 (!) 95/54         LOS: 19 days A FACE TO FACE EVALUATION WAS PERFORMED  [address] Lyan Moyano 04/13/2023, 11:14 AM

## 2023-04-14 LAB — CBC
HCT: 27.1 % — ABNORMAL LOW (ref 36.0–46.0)
Hemoglobin: 9.2 g/dL — ABNORMAL LOW (ref 12.0–15.0)
MCH: 28.4 pg (ref 26.0–34.0)
MCHC: 33.9 g/dL (ref 30.0–36.0)
MCV: 83.6 fL (ref 80.0–100.0)
Platelets: 68 K/uL — ABNORMAL LOW (ref 150–400)
RBC: 3.24 MIL/uL — ABNORMAL LOW (ref 3.87–5.11)
RDW: 17.8 % — ABNORMAL HIGH (ref 11.5–15.5)
WBC: 4.5 K/uL (ref 4.0–10.5)
nRBC: 0 % (ref 0.0–0.2)

## 2023-04-14 LAB — BASIC METABOLIC PANEL
Anion gap: 14 (ref 5–15)
BUN: 13 mg/dL (ref 6–20)
CO2: 17 mmol/L — ABNORMAL LOW (ref 22–32)
Calcium: 9.1 mg/dL (ref 8.9–10.3)
Chloride: 110 mmol/L (ref 98–111)
Creatinine, Ser: 0.93 mg/dL (ref 0.44–1.00)
GFR, Estimated: >60 mL/min (ref >60)
Glucose, Bld: 82 mg/dL (ref 70–99)
Potassium: 3.7 mmol/L (ref 3.5–5.1)
Sodium: 141 mmol/L (ref 135–145)

## 2023-04-14 NOTE — Progress Notes (Signed)
 Orthopedic Tech Progress Note Patient Details:  Jacqueline [name] [birth year] [id number]  Called in order to [employer] for an AIRCAST (even though I just gave her one on the 27th) SW[employer]ISH KNEE CAGE, and a TOE CAP all for the right side. Patient room is a mess and they could find some things if they straighten up that room   Patient ID: Jacqueline [name], female   DOB: 2000/10/22, 23 y.o.   MRN: [id number]  Jacqueline [name] 04/14/2023, 3:44 PM

## 2023-04-14 NOTE — Progress Notes (Signed)
Pt refused lab

## 2023-04-14 NOTE — Progress Notes (Signed)
Physical Therapy Session Note  Patient Details  Name: Jacqueline [name] MRN: [id number] Date of Birth: 02-17-2001  Today's Date: 04/14/2023 PT Individual Time: 1131-1210 PT Individual Time Calculation (min): 39 min   Short Term Goals: Week 2:  PT Short Term Goal 1 (Week 2): pt will perform all aspects of bed mobility with mod A consistantly PT Short Term Goal 1 - Progress (Week 2): Met PT Short Term Goal 2 (Week 2): pt will transfer bed<>chair with mod A of 1 consistantly PT Short Term Goal 2 - Progress (Week 2): Met PT Short Term Goal 3 (Week 2): pt will ambulate 48ft with LRAD and skilled max A of 1 PT Short Term Goal 3 - Progress (Week 2): Progressing toward goal Week 3:  PT Short Term Goal 1 (Week 3): pt will perform all aspects of bed mobility with min A consistantly PT Short Term Goal 2 (Week 3): pt will transfer bed<>chair with LRAD and min A consistantly PT Short Term Goal 3 (Week 3): pt will ambulate 46ft with LRAD and skilled max A of 1  Skilled Therapeutic Interventions/Progress Updates:  Patient supine in bed on entrance to room. Patient alert and agreeable to PT session with minimal need for extra encouragement.   Patient with no pain complaint at start of session. Rehab Tech relates noted issues with R ankle stability in previous sessions. Difficulty with donning aircast as well as donning shoe over aircast. Pt agreeable to trial Airsport Aircast from orthotic supplier.   Therapeutic Activity: Bed Mobility: Pt performed supine > sit with increased effort and supervision/ light CGA. VC/ tc required for effort and direction of lean. Transfers: Pt performed sit<>stand with CGA/ supervision to [facility] or [facility]. Stand pivot transfers throughout session with CGA/ light MinA initially and improving to light CGA with use of HW. Provided vc/ tc for technique as needed.  While seated EOB, pt requires MaxA to don Airsport brace and relates good fit. Difficult to don into shoe even with shoe  horn d/t need for increased size or width of shoe to accommodate brace. Is donned with related good fit from pt.   Gait Training:  Gait training initiated with Airsport Aircast to R ankle. Starts from room using HW with CGA/ MinA and completes 200 ft exactly with good ankle stability. Demonstrated very slow pace with step-to gait pattern leading with RLE. Provided vc/ tc for leading with R knee flexion for improved foot clearance, intermittent facilitation of increased R hip extension and forward translation for improved advancement.   Patient supine in bed at end of session with brakes locked, bed alarm set, and all needs within reach. Lunch tray arriving.    Therapy Documentation Precautions:  Precautions Precautions: Fall Precaution Comments: PEG, expressive aphasia with agitation, Rt hemi Restrictions Weight Bearing Restrictions Per Provider Order: No  Pain: No pain related from pt throughout session.   Therapy/Group: Individual Therapy  Loel Dubonnet PT, DPT, CSRS 04/14/2023, 12:23 PM

## 2023-04-14 NOTE — Progress Notes (Addendum)
 Patient ID: Jacqueline [name], female   DOB: [birth year], 23 y.o.   MRN: [id number] Spoke with sister who will be here today but expressed pt needs to stay here another week to get more therapies and make more progress. She is aware her sister has refused and this can create insurance issues. Aware she will need 24/7 care and not sure in place.  1:37 PM Have left message to [facility] regarding pt's HCPOA forms and needing notorary and witnesses needed.   2;11 PM Met with [employer], pt and sister who have observed her in therapies. Both want her to stay longer to get better. Pt continues to want to go home and was pouting. Will message regular therapists to see and MD.

## 2023-04-14 NOTE — Progress Notes (Signed)
 PROGRESS NOTE   Subjective/Complaints: Discussed with team therapy refusals, discussed with patient caregiver education today, encouraged her to participate with therapy  ROS: Limited by aphasia, +right sided weakness, improved appetite, +menses with heavy flow--?resolved, +right upper extremity pain   Objective:   No results found.   No results for input(s): "WBC", "HGB", "HCT", "PLT" in the last 72 hours.   No results for input(s): "NA", "K", "CL", "CO2", "GLUCOSE", "BUN", "CREATININE", "CALCIUM" in the last 72 hours.    Intake/Output Summary (Last 24 hours) at 04/14/2023 1018 Last data filed at 04/13/2023 1309 Gross per 24 hour  Intake 500 ml  Output --  Net 500 ml          Physical Exam: Vital Signs Blood pressure (!) 105/46, pulse 94, temperature 98.5 F (36.9 C), temperature source Oral, resp. rate 18, height 5\' 4"  (1.626 m), weight 72.9 kg, SpO2 97%, unknown if currently breastfeeding.   General: NAD, laying in bed with boyfriend present HEENT: Head is normocephalic, atraumatic, mucous membranes moist Neck: Supple without JVD or lymphadenopathy Heart: Reg rate and rhythm. No murmurs rubs or gallops Chest: CTA bilaterally without wheezes, rales, or rhonchi; no distress Abdomen: Positive bowel sounds, abdomen is soft, nonTTP, G tube c/d/I.   Extremities: No clubbing, cyanosis, or edema. Pulses are 2+ Psych: calmer today than past visits Skin: Clean and intact without signs of breakdown over exposed surfaces, Gtube C/D/I Neuro: expressive aphasia, improving  MsK: RUE/RLE weakness stable but seen moving fingers 2/8! moves LUE/LLE antigravity  Musculoskeletal:     Cervical back: Normal range of motion and neck supple.     Comments: LUE/LLE moving well again gravity RUE- no voluntary movement seen of RUE or RLE   Neurological:     Comments: Patient came back from working with therapy.  Awake and alert.   She does make eye contact with examiner.  Increased verbal output today. MAS of 3 in R shoulder esp External rotation; MAS of 4 in R elbow- lacking 45 degrees of extension with a lot of work; MAS of 2-3 of R hand- in fist at rest, but could completely open fingers and get past wrist neutral.  Difficult to assess sensation, stable 2/10    Assessment/Plan: 1. Functional deficits which require 3+ hours per day of interdisciplinary therapy in a comprehensive inpatient rehab setting. Physiatrist is providing close team supervision and 24 hour management of active medical problems listed below. Physiatrist and rehab team continue to assess barriers to discharge/monitor patient progress toward functional and medical goals  Care Tool:  Bathing    Body parts bathed by patient: Right arm, Chest, Abdomen, Front perineal area, Buttocks, Right upper leg, Left upper leg, Left lower leg, Face   Body parts bathed by helper: Right lower leg, Left arm     Bathing assist Assist Level: Moderate Assistance - Patient 50 - 74%     Upper Body Dressing/Undressing Upper body dressing   What is the patient wearing?: Pull over shirt    Upper body assist Assist Level: Minimal Assistance - Patient > 75%    Lower Body Dressing/Undressing Lower body dressing      What is the patient wearing?:  Pants     Lower body assist Assist for lower body dressing: Moderate Assistance - Patient 50 - 74%     Toileting Toileting    Toileting assist Assist for toileting: Moderate Assistance - Patient 50 - 74%     Transfers Chair/bed transfer  Transfers assist  Chair/bed transfer activity did not occur: Safety/medical concerns (unsafe to get up)  Chair/bed transfer assist level: Moderate Assistance - Patient 50 - 74%     Locomotion Ambulation   Ambulation assist      Assist level: Moderate Assistance - Patient 50 - 74% Assistive device: Walker-hemi Max distance: 166ft   Walk 10 feet  activity   Assist     Assist level: Moderate Assistance - Patient - 50 - 74% Assistive device: Walker-hemi   Walk 50 feet activity   Assist Walk 50 feet with 2 turns activity did not occur: Safety/medical concerns  Assist level: Moderate Assistance - Patient - 50 - 74% Assistive device: Walker-hemi    Walk 150 feet activity   Assist Walk 150 feet activity did not occur: Safety/medical concerns         Walk 10 feet on uneven surface  activity   Assist Walk 10 feet on uneven surfaces activity did not occur: Safety/medical concerns         Wheelchair     Assist Is the patient using a wheelchair?: Yes Type of Wheelchair: Manual Wheelchair activity did not occur: Safety/medical concerns  Wheelchair assist level: Supervision/Verbal cueing Max wheelchair distance: 125ft    Wheelchair 50 feet with 2 turns activity    Assist    Wheelchair 50 feet with 2 turns activity did not occur: Safety/medical concerns   Assist Level: Supervision/Verbal cueing   Wheelchair 150 feet activity     Assist  Wheelchair 150 feet activity did not occur: Safety/medical concerns   Assist Level: Supervision/Verbal cueing   Blood pressure (!) 105/46, pulse 94, temperature 98.5 F (36.9 C), temperature source Oral, resp. rate 18, height 5\' 4"  (1.626 m), weight 72.9 kg, SpO2 97%, unknown if currently breastfeeding.   Medical Problem List and Plan: 1. 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  Antiphospholipid antibody syndrome: discussed that Eliquis  5mg  BID will help to reduce risk of clotting/strokes, continue  Discussed that Eliquis  will increase her menstrual bleeds, continue megace   3. Pain Management: continue Neurontin  100 mg 3 times daily, topamax  d/ced given hyperchloremia, oxycodone  5 mg every 4 hours as needed -03/30/23 some tingling in R arm, positional? Manageable with meds. OT working on releasing scapula and helping with positioning; monitor -04/05/23 pain controlled, cont regimen  4. Anxiety: continue Klonopin  0.5 mg nightly, melatonin 5 mg nightly as needed -antipsychotic agents: suggest Depakote  or increase in gabapentin  to help calm pt down;  5. Neuropsych/cognition: This patient is capable of making decisions on  her own behalf. 6. Skin/Wound Care: routine skin care for PEG and overall skin 7. Fluids/Electrolytes/Nutrition: will assess labs, monitor I&Os, cont supplements -03/29/23 K a little low 3.4, see #18; Na improved. BMP otherwise fairly unremarkable.  -03/30/23 K 4.0 today, BMP otherwise ok today, monitor -04/05/23 K 3.3, will give 60mEq today, monitor labs Monday  8.  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  Seen by oncology/hematology once in 2024-- needs to f/up  9.  Thrombocytopenia/iron  deficiency/folic acid  deficiency/B12 deficiency.  Follow-up CBC.  Continue Niferex 150mg  daily, and supplements  -04/05/23 Plt 90, stable, monitor   10.  Dysphagia.  Status post gastrostomy tube 03/21/2023 per interventional radiology.  Diet has been advanced to regular.  D/c TF. Discussed G-tube can be removed after 6-8 weeks from placement date-- discussed this again on 04/12/23, pt antsy to  remove it. Discuss this again with her during d/c planning.   11.  E. coli UTI.  Antibiotic therapy completed 12.  Tobacco/alcohol use.  Counseling 13. Severe RUE spasticity- would strongly suggest Botox of RUE soon as well as resting hand splint for R wrist and R PRAFO- will order.   14. Abd pain- from PEG vs constipation- if no BM, might need KUB.  -IR consult to assess Gtube site.  Pus noted at skin site. Appears CT scan is ordered -Likely G-tube infection.  She is going for a IR procedure today.  Hospitalist team following-appreciate assistance.  She has been started on vancomycin  and ceftriaxone  for abdominal infection.  Echocardiogram and cultures have been ordered.  Spoke with hospitalist today, ID has been consulted. -1/24 ID recommending vancomycin  for 7 days total and can use linezolid if discharged prior to this -03/29/23 appreciate hospitalist help, repeat BCx neg so far, ID signed off; looks like abd xray ordered, pending. Advancing diet as above.  -03/30/23 no abd pain today, other than menstrual cramping, has tylenol  if needed; abd xray yesterday fine. Hospitalist still following, no note yet today. Appreciate their assistance.  -04/05/23 still with abd cramping-- no significant tenderness; getting TVUS as below; monitor -04/06/23 no abd pain today, but LBM 04/03/23, cont miralax /colace BID but may need further intervention if no BM by tomorrow -04/12/23 LBM ?2/5 or today (per pt, just not documented); monitor for now, but if no BM by tomorrow, might want to further adjust meds. Refusing some meds intermittently... -04/13/23 LBM this morning according to pt AND boyfriend who's with her; none documented though; hard to know what to believe, but boyfriend corroborating that her LBM was today makes me think it's accurate... so monitor for now.    15.  Elevated transaminases-mild, improved  Kpad ordered to minimize tylenol  use  16.  Hyponatremia: resolved, monitor Na weekly. Improved  04/05/23  17.  History of ileus: d/c feeding supplements and continue regular diet-- see #14 regarding constipation  18. Hypokalemia: klor 40 ordered 1/27, monitor potassium weekly-- see #7  19. Right sided wrist drop: wrist cock-up splint ordered, conitnue prn  20. Right sided ankle instability: aircast ordered, discussed with PT that it has been received, continue prn  21. Anemia/menorrhagia: discussed stool occult is positive, repeat ordered for today, CBC for today has not yet been drawn, chart reviewed and could be secondary to menses with started 1/31 -04/05/23 pt with dropping hgb the entire week, heavy menses all week, Hgb down to 6.9 overnight with associated hypotension/tachycardia, got 2U  PRBCs. VS improving/stable. Spoke with mom and updated her. Pt understands, too.  -Spoke with OBGYN on call Dr. Randel Buss who requested we get a TVUS and start Megace  40mg  BID, continue Eliquis , they will f/up with TVUS once it's done, may need to increase Megace  to 80mg  if heavy bleeding persists but for now start with 40mg  BID; monitor closely. F/up CBC after transfusion -04/06/23 post transfusion Hgb 8.5, recheck tomorrow morning; asked that RNs document regarding menstrual flow and clots, to monitor how Megace  is helping; must monitor closely; pelvic US  done transabdominally only due to pt refusal, no abnormalities seen. Reach out to OBGYN if further assistance is needed Monitor CBC weekly -04/12/23 pt states her menses stopped, no documentation anywhere about this-- weekday team might want to touch base with OBGYN to see what the plan for her Megace  is... -Hgb 8.0 on 2/6, bouncing around this week, monitor on Monday but have a low threshold for recheck   22/ Hyperchloremia: may be 2/2 to dehydration from heavy menstrual bleeds, s/p IVF, d/c topamax   23. Hypotension: s/p IVF with improvement, continue to monitor BP TID  -2/8-9/25 BP soft but stable, likely her baseline; monitor.  Vitals:   04/10/23 0438  04/10/23 1504 04/10/23 2013 04/11/23 0525  BP: (!) 92/53 (!) 102/49 (!) 106/58 (!) 86/55   04/12/23 0544 04/12/23 1409 04/12/23 1959 04/13/23 0526  BP: (!) 90/46 (!) 99/59 (!) 91/49 (!) 95/54   04/13/23 1200 04/13/23 1613 04/13/23 1949 04/14/23 0507  BP: (!) 115/95 98/68 (!) 102/55 (!) 105/46         LOS: 20 days A FACE TO FACE EVALUATION WAS PERFORMED  Sunita Demond P Kelvon Giannini 04/14/2023, 10:18 AM

## 2023-04-14 NOTE — Progress Notes (Signed)
 Physical Therapy Session Note  Patient Details  Name: Jacqueline [name] MRN: [id number] Date of Birth: [birth year]  Today's Date: 04/14/2023 PT Individual Time: 1417-1502 PT Individual Time Calculation (min): 45 min   Short Term Goals: Week 3:  PT Short Term Goal 1 (Week 3): pt will perform all aspects of bed mobility with min A consistantly PT Short Term Goal 2 (Week 3): pt will transfer bed<>chair with LRAD and min A consistantly PT Short Term Goal 3 (Week 3): pt will ambulate 49ft with LRAD and skilled max A of 1  Skilled Therapeutic Interventions/Progress Updates:     Pt received seated in WC and agrees to therapy. No complaint of pain. [name] and boyfriend both present for family education. PT provides update on pt's mobility progress during rehab stay as well as providing recommendations for safe mobility following discharge, and reiterating importance of pt remaining at [facility] and participating regularly. WC transport to gym. Pt performs car transfer with PT providing minA/modA and cues for sequencing, hand placement, and positioning. Following, pt's sister provides assistance for pt to perform additional car transfer as PT provides cueing on safe guarding technique. Seated rest break. Pt completes x4 6" steps with Lt hand rail and cues for safe step sequencing, with minA at trunk for stability. Seated rest break. Pt ambulates x70' with hemiwalker and minA/modA, with PT providing tactile cueing at Rt popliteal fossa to prevent hyperextension in stance phase, as well as cueing to "kick" RLE during swing phase, and cues at trunk for stability. WC transport back to room. Stand pivot to bed with modA. Left supine with all needs within reach.    Therapy Documentation Precautions:  Precautions Precautions: Fall Precaution Comments: PEG, expressive aphasia with agitation, Rt hemi Restrictions Weight Bearing Restrictions Per Provider Order: No   Therapy/Group: Individual Therapy  Neva Barban, PT, DPT 04/14/2023, 3:45 PM

## 2023-04-14 NOTE — Progress Notes (Signed)
 Occupational Therapy Session Note  Patient Details  Name: Jacqueline [name] MRN: [id number] Date of Birth: [birth year]  Today's Date: 04/14/2023 OT Individual Time: 1308-6578 OT Individual Time Calculation (min): 40 min   Today's Date: 04/14/2023 OT Individual Time: 1330-1413 OT Individual Time Calculation (min): 43 min   Short Term Goals: Week 3:  OT Short Term Goal 1 (Week 3): Pt will complete 2/3 toileting steps with Min A for standing balance OT Short Term Goal 2 (Week 3): Pt will donn RUE resting hand splint with min cues OT Short Term Goal 3 (Week 3): Pt will tolerate WB through RUE during functional task for at least 2 mins with min A provided for UE stability  Skilled Therapeutic Interventions/Progress Updates:   Session 1: Pt received resting in bed, engaging with cell-phone for skilled therapy session with focus on BADL participation and functional mobility. Pt comes to EOB with Min A for RLE management, use of grab bars. At EOB, pt requires Min A to doff/don UB garments, unable to recall hemi-dressing techniques, re-educated. Pt requires Min A for threading of RLE into shorts, standing hike with Min A for R-side, CGA + hemi walker for support. Stand-pivot from EOB>WC with Min A. Sink-side grooming with assistance for container management, RUE supported on sink-edge. Pt dependent for donning B-tennis shoes, eager to ambulate at room-level with use of hemi-walker. Pt walks ~15 ft within room, CGA for standing balance, A for advancement of RLE. Pt returns to supine with Min A for RUE/RLE. Pt remained resting in bed, alarm activated.  Session 2: Pt received resting in bed with session focused on caregiver education. No complaints of pain. [name] and boyfriend present for caregiver education with focus on OT role, OT POC, and current patient functioning. Bed mobility, functional transfers (stand-pivot from EOB<>WC), R-hemi body deficits, and full-body dressing/bathing, as well as,  house layout (see OT flowsheets) reviewed/demonstrated. [name] completes hands-on training for sit<>stand transfer (as patient only allowed this transfer during session). Discussed appropriateness for follow-up HHOT. All immediate questions answered at end of session, pt remained seated in WC.   Therapy Documentation Precautions:  Precautions Precautions: Fall Precaution Comments: PEG, expressive aphasia with agitation, Rt hemi Restrictions Weight Bearing Restrictions Per Provider Order: No  Therapy/Group: Individual Therapy  Artemus Biles, OTR/L, MSOT  04/14/2023, 6:32 AM

## 2023-04-14 NOTE — Progress Notes (Signed)
 Speech Language Pathology Daily Session Note  Patient Details  Name: Jacqueline [name] MRN: [id number] Date of Birth: 08/13/2000  Today's Date: 04/14/2023 SLP Individual Time: 2440-1027 SLP Individual Time Calculation (min): 26 min  Short Term Goals: Week 3: SLP Short Term Goal 1 (Week 3): Patient will follow one step directions with 75% acc given mod multimodal cues SLP Short Term Goal 2 (Week 3): Patient will answer simple yes/ no questions with 60% acc given supervision multimodal cues SLP Short Term Goal 3 (Week 3): Patient will repeat single words with 60% acc with min multimodal cues SLP Short Term Goal 4 (Week 3): Patient will complete automatic speech task with 50% acc given max multimodal cues  Skilled Therapeutic Interventions: SLP conducted skilled therapy session targeting communication goals and family education. Patient originally attempted to refuse therapy, however sister convinced patient to participate. Educated sister and patient's boyfriend re: Aphasia, communications strategies and targets, multimodal communication, and options for higher-tech AAC. Targeted repetition of functional phrases with patient. Given total verbal cues, patient verbalized "zip", "Jazae", "omar", "one, two, three", and "dee". Educated family on perseveration and techniques re: how to break patient out of perseveration. Patient was left in lowered bed with call bell in reach and bed alarm set. SLP will continue to target goals per plan of care.      Pain None endorsed   Therapy/Group: Individual Therapy  Jacqueline [name], M.A., CCC-SLP  Jacqueline [name] 04/14/2023, 3:43 PM

## 2023-04-15 ENCOUNTER — Other Ambulatory Visit (HOSPITAL_COMMUNITY): Payer: Self-pay

## 2023-04-15 MED ORDER — MEGESTROL ACETATE 40 MG PO TABS
40.0000 mg | ORAL_TABLET | Freq: Two times a day (BID) | ORAL | 0 refills | Status: DC
Start: 2023-04-15 — End: 2023-04-22
  Filled 2023-04-15: qty 60, 30d supply, fill #0

## 2023-04-15 MED ORDER — POLYSACCHARIDE IRON COMPLEX 150 MG PO CAPS
150.0000 mg | ORAL_CAPSULE | Freq: Every day | ORAL | 0 refills | Status: DC
  Filled 2023-04-15: qty 30, 30d supply, fill #0

## 2023-04-15 MED ORDER — CLONAZEPAM 0.5 MG PO TABS
0.5000 mg | ORAL_TABLET | Freq: Every day | ORAL | 0 refills | Status: DC
  Filled 2023-04-15: qty 30, 30d supply, fill #0

## 2023-04-15 MED ORDER — POLYETHYLENE GLYCOL 3350 17 G PO PACK: 17.0000 g | PACK | Freq: Two times a day (BID) | ORAL | Status: DC

## 2023-04-15 MED ORDER — WITCH HAZEL-GLYCERIN EX PADS: MEDICATED_PAD | CUTANEOUS | Status: DC | PRN

## 2023-04-15 MED ORDER — DOCUSATE SODIUM 100 MG PO CAPS: 100.0000 mg | ORAL_CAPSULE | Freq: Two times a day (BID) | ORAL | Status: DC

## 2023-04-15 MED ORDER — APIXABAN 5 MG PO TABS
5.0000 mg | ORAL_TABLET | Freq: Two times a day (BID) | ORAL | 0 refills | Status: DC
  Filled 2023-04-15: qty 60, 30d supply, fill #0

## 2023-04-15 MED ORDER — CYANOCOBALAMIN 1000 MCG PO TABS
2000.0000 ug | ORAL_TABLET | Freq: Every day | ORAL | 0 refills | Status: DC
  Filled 2023-04-15: qty 60, 30d supply, fill #0

## 2023-04-15 MED ORDER — OXYCODONE HCL 5 MG PO TABS
5.0000 mg | ORAL_TABLET | ORAL | 0 refills | Status: DC | PRN
  Filled 2023-04-15: qty 30, 5d supply, fill #0

## 2023-04-15 MED ORDER — ADULT MULTIVITAMIN W/MINERALS CH: 1.0000 | ORAL_TABLET | Freq: Every day | ORAL | Status: DC

## 2023-04-15 MED ORDER — FOLIC ACID 1 MG PO TABS
1.0000 mg | ORAL_TABLET | Freq: Every day | ORAL | 0 refills | Status: DC
Start: 2023-04-15 — End: 2023-08-05
  Filled 2023-04-15: qty 30, 30d supply, fill #0

## 2023-04-15 NOTE — Progress Notes (Signed)
PROGRESS NOTE   Subjective/Complaints: No new complaints with me this morning Sleepy Complains of rectal pain, messaged nursing regarding last BM  ROS: Limited by aphasia, +right sided weakness, improved appetite, +menses with heavy flow--?resolved, +right upper extremity pain, +rectal pain   Objective:   No results found.   Recent Labs    04/14/23 1509  WBC 4.5  HGB 9.2*  HCT 27.1*  PLT 68*     Recent Labs    04/14/23 1509  NA 141  K 3.7  CL 110  CO2 17*  GLUCOSE 82  BUN 13  CREATININE 0.93  CALCIUM 9.1      Intake/Output Summary (Last 24 hours) at 04/15/2023 1045 Last data filed at 04/14/2023 1942 Gross per 24 hour  Intake 1372 ml  Output 0 ml  Net 1372 ml          Physical Exam: Vital Signs Blood pressure (!) 95/53, pulse 73, temperature 98 F (36.7 C), resp. rate 16, height 5\' 4"  (1.626 m), weight 72.9 kg, SpO2 100%, unknown if currently breastfeeding.   General: NAD, laying in bed with boyfriend present HEENT: Head is normocephalic, atraumatic, mucous membranes moist Neck: Supple without JVD or lymphadenopathy Heart: Reg rate and rhythm. No murmurs rubs or gallops Chest: CTA bilaterally without wheezes, rales, or rhonchi; no distress Abdomen: Positive bowel sounds, abdomen is soft, nonTTP, G tube c/d/I.   Extremities: No clubbing, cyanosis, or edema. Pulses are 2+ Psych: calmer today than past visits Skin: Clean and intact without signs of breakdown over exposed surfaces, Gtube C/D/I Neuro: expressive aphasia, improving  MsK: RUE/RLE weakness stable but seen moving fingers 2/8! moves LUE/LLE antigravity  Musculoskeletal:     Cervical back: Normal range of motion and neck supple.     Comments: LUE/LLE moving well again gravity RUE- no voluntary movement seen of RUE or RLE   Neurological:     Comments: Patient came back from working with therapy.  Awake and alert.  She does make eye  contact with examiner.  Increased verbal output today. MAS of 3 in R shoulder esp External rotation; MAS of 4 in R elbow- lacking 45 degrees of extension with a lot of work; MAS of 2-3 of R hand- in fist at rest, but could completely open fingers and get past wrist neutral.  Difficult to assess sensation, stable 2/11    Assessment/Plan: 1. Functional deficits which require 3+ hours per day of interdisciplinary therapy in a comprehensive inpatient rehab setting. Physiatrist is providing close team supervision and 24 hour management of active medical problems listed below. Physiatrist and rehab team continue to assess barriers to discharge/monitor patient progress toward functional and medical goals  Care Tool:  Bathing    Body parts bathed by patient: Right arm, Chest, Abdomen, Front perineal area, Buttocks, Right upper leg, Left upper leg, Left lower leg, Face   Body parts bathed by helper: Right lower leg, Left arm     Bathing assist Assist Level: Moderate Assistance - Patient 50 - 74%     Upper Body Dressing/Undressing Upper body dressing   What is the patient wearing?: Pull over shirt    Upper body assist Assist Level: Minimal  Assistance - Patient > 75%    Lower Body Dressing/Undressing Lower body dressing      What is the patient wearing?: Pants     Lower body assist Assist for lower body dressing: Moderate Assistance - Patient 50 - 74%     Toileting Toileting    Toileting assist Assist for toileting: Moderate Assistance - Patient 50 - 74%     Transfers Chair/bed transfer  Transfers assist  Chair/bed transfer activity did not occur: Safety/medical concerns (unsafe to get up)  Chair/bed transfer assist level: Moderate Assistance - Patient 50 - 74%     Locomotion Ambulation   Ambulation assist      Assist level: Moderate Assistance - Patient 50 - 74% Assistive device: Walker-hemi Max distance: 157ft   Walk 10 feet activity   Assist     Assist  level: Moderate Assistance - Patient - 50 - 74% Assistive device: Walker-hemi   Walk 50 feet activity   Assist Walk 50 feet with 2 turns activity did not occur: Safety/medical concerns  Assist level: Moderate Assistance - Patient - 50 - 74% Assistive device: Walker-hemi    Walk 150 feet activity   Assist Walk 150 feet activity did not occur: Safety/medical concerns         Walk 10 feet on uneven surface  activity   Assist Walk 10 feet on uneven surfaces activity did not occur: Safety/medical concerns         Wheelchair     Assist Is the patient using a wheelchair?: Yes Type of Wheelchair: Manual Wheelchair activity did not occur: Safety/medical concerns  Wheelchair assist level: Supervision/Verbal cueing Max wheelchair distance: 172ft    Wheelchair 50 feet with 2 turns activity    Assist    Wheelchair 50 feet with 2 turns activity did not occur: Safety/medical concerns   Assist Level: Supervision/Verbal cueing   Wheelchair 150 feet activity     Assist  Wheelchair 150 feet activity did not occur: Safety/medical concerns   Assist Level: Supervision/Verbal cueing   Blood pressure (!) 95/53, pulse 73, temperature 98 F (36.7 C), resp. rate 16, height 5\' 4"  (1.626 m), weight 72.9 kg, SpO2 100%, unknown if currently breastfeeding.   Medical Problem List and Plan: 1. 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  Antiphospholipid antibody syndrome: discussed that Eliquis 5mg  BID will help to reduce risk of clotting/strokes, continue  Discussed that Eliquis will increase her menstrual bleeds, continue megace  3. Pain Management: continue Neurontin 100 mg 3 times daily, topamax d/ced given hyperchloremia, oxycodone 5 mg every 4 hours as needed -03/30/23 some tingling in R arm, positional? Manageable with meds. OT working on releasing scapula and helping with positioning; monitor -04/05/23 pain controlled, cont regimen  4. Anxiety: continue Klonopin 0.5 mg nightly, melatonin 5 mg nightly as needed -antipsychotic agents: suggest  Depakote or increase in gabapentin to help calm pt down;  5. Neuropsych/cognition: This patient is capable of making decisions on her own behalf. 6. Skin/Wound Care: routine skin care for PEG and overall skin 7. Fluids/Electrolytes/Nutrition: will assess labs, monitor I&Os, cont supplements -03/29/23 K a little low 3.4, see #18; Na improved. BMP otherwise fairly unremarkable.  -03/30/23 K 4.0 today, BMP otherwise ok today, monitor -04/05/23 K 3.3, will give today, monitor labs Monday  8.  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  Seen by oncology/hematology once in 2024-- needs to f/up  9.  Thrombocytopenia/iron deficiency/folic acid deficiency/B12 deficiency.  Follow-up CBC.  Continue Niferex 150mg  daily, and supplements  -04/05/23 Plt 90, stable, monitor   10.  Dysphagia.  Status post gastrostomy tube 03/21/2023 per interventional radiology.  Diet has been advanced to regular.  D/c TF. Discussed G-tube can be removed after 6-8 weeks from placement date-- discussed this again on 04/12/23, pt antsy to remove it. Discuss this again with her during d/c planning.   11.  E.  coli UTI.  Antibiotic therapy completed 12.  Tobacco/alcohol use.  Counseling 13. Severe RUE spasticity- would strongly suggest Botox of RUE soon as well as resting hand splint for R wrist and R PRAFO- will order.   14. Abd pain- from PEG vs constipation- if no BM, might need KUB.  -IR consult to assess Gtube site.  Pus noted at skin site. Appears CT scan is ordered -Likely G-tube infection.  She is going for a IR procedure today.  Hospitalist team following-appreciate assistance.  She has been started on vancomycin and ceftriaxone for abdominal infection.  Echocardiogram and cultures have been ordered.  Spoke with hospitalist today, ID has been consulted. -1/24 ID recommending vancomycin for 7 days total and can use linezolid if discharged prior to this -03/29/23 appreciate hospitalist help, repeat BCx neg so far, ID signed off; looks like abd xray ordered, pending. Advancing diet as above.  -03/30/23 no abd pain today, other than menstrual cramping, has tylenol if needed; abd xray yesterday fine. Hospitalist still following, no note yet today. Appreciate their assistance.  -04/05/23 still with abd cramping-- no significant tenderness; getting TVUS as below; monitor -04/06/23 no abd pain today, but LBM 04/03/23, cont miralax/colace BID but may need further intervention if no BM by tomorrow -04/12/23 LBM ?2/5 or today (per pt, just not documented); monitor for now, but if no BM by tomorrow, might want to further adjust meds. Refusing some meds intermittently... -04/13/23 LBM this morning according to pt AND boyfriend who's with her; none documented though; hard to know what to believe, but boyfriend corroborating that her LBM was today makes me think it's accurate... so monitor for now.    15.  Elevated transaminases-mild, improved  Kpad ordered to minimize tylenol use  16.  Hyponatremia: resolved, monitor Na weekly. Improved 04/05/23  17.  History of ileus: d/c feeding supplements and continue regular diet--  see #14 regarding constipation  18. Hypokalemia: klor 40 ordered 1/27, monitor potassium weekly-- see #7  19. Right sided wrist drop: wrist cock-up splint ordered, conitnue prn  20. Right sided ankle instability: aircast ordered, discussed with PT that it has been received, continue prn  21. Anemia/menorrhagia: discussed stool occult is positive, repeat ordered for today, CBC for today has not yet been drawn, chart reviewed and could be secondary to menses with started 1/31 -04/05/23  pt with dropping hgb the entire week, heavy menses all week, Hgb down to 6.9 overnight with associated hypotension/tachycardia, got 2U PRBCs. VS improving/stable. Spoke with mom and updated her. Pt understands, too.  -Spoke with OBGYN on call Dr. Nobie Putnam who requested we get a TVUS and start Megace 40mg  BID, continue Eliquis, they will f/up with TVUS once it's done, may need to increase Megace to 80mg  if heavy bleeding persists but for now start with 40mg  BID; monitor closely. F/up CBC after transfusion -04/06/23 post transfusion Hgb 8.5, recheck tomorrow morning; asked that RNs document regarding menstrual flow and clots, to monitor how Megace is helping; must monitor closely; pelvic US done transabdominally only due to pt refusal, no abnormalities seen. Reach out to OBGYN if further assistance is needed Monitor CBC weekly -04/12/23 pt states her menses stopped, no documentation anywhere about this-- weekday team might want to touch base with OBGYN to see what the plan for her Megace is... -Hgb 8.0 on 2/6, labs ordered but patient refused  22/ Hyperchloremia: may be 2/2 to dehydration from heavy menstrual bleeds, s/p IVF, d/c topamax, labs ordered but patient refused  23. Hypotension: s/p IVF with improvement, continue to monitor BP TID   Vitals:   04/11/23 0525 04/12/23 0544 04/12/23 1409 04/12/23 1959  BP: (!) 86/55 (!) 90/46 (!) 99/59 (!) 91/49   04/13/23 0526 04/13/23 1200 04/13/23 1613 04/13/23 1949  BP: (!)  95/54 (!) 115/95 98/68 (!) 102/55   04/14/23 0507 04/14/23 1530 04/14/23 2014 04/15/23 0457  BP: (!) 105/46 102/63 (!) 97/52 (!) 95/53       24. Rectal pain: with hazel pads ordered  LOS: 21 days A FACE TO FACE EVALUATION WAS PERFORMED  Elizabet Schweppe P Baldwin [place] 04/15/2023, 10:45 AM

## 2023-04-15 NOTE — Progress Notes (Signed)
Occupational Therapy Session Note  Patient Details  Name: Jacqueline [name] MRN: [id number] Date of Birth: 06/01/2000  Today's Date: 04/15/2023 OT Individual Time: 1191-4782 & 1100-1115 OT Individual Time Calculation (min): 25 min & 15 min  W/C eval total time with PT: 1100-1130 Time calculation: 30 min   Short Term Goals: Week 3:  OT Short Term Goal 1 (Week 3): Pt will complete 2/3 toileting steps with Min A for standing balance OT Short Term Goal 2 (Week 3): Pt will donn RUE resting hand splint with min cues OT Short Term Goal 3 (Week 3): Pt will tolerate WB through RUE during functional task for at least 2 mins with min A provided for UE stability  Skilled Therapeutic Interventions/Progress Updates:  Session 1 Skilled OT intervention completed with focus on ADL retraining, functional transfers. Pt received semi supine, requesting to use bathroom. Pt agreeable to session. Rectal pain reported; pre-medicated. Nurse notified. OT offered rest breaks, repositioning throughout for pain reduction.  Transitioned to EOB with min A for RLE only. Min/mod A stand pivot > w/c > BSC in bathroom with cues for RLE positioning and sequencing. Pt incontinent of urine, however further continent of urinary void. Stood with min A, with decent control of RLE noted. Min A for dynamic balance while pt completed pericare, and mod A donning of brief. Min/mod A stand pivot > w/c. Max cues needed for sequencing, but able to doff gown with min A and donn new one with hemi technique with min A. Pt refused to stay seated in w/c for breakfast. Min A stand pivot > EOB, min A for bed mobility for RLE. Assist needed to cut french toast and open items but otherwise set up A.  Extensive conversation regarding DC plans as pt is upset about therapy team recommendations for extension and family's advocacy for pt to stay. Encouraged pt that participation is needed and that if she continues to miss sessions, she will DC  automatically however goal is to be more mobile for herself and for her kids sake. Pt slightly receptive. Pt remained upright in bed, with bed alarm on/activated, and with all needs in reach at end of session.  Session 2 Skilled OT intervention completed with focus on custom w/c evaluation with [employer] rep present Jacqueline [name]) and pt's primary PT. Pt received supine in bed, asleep. Required time to arouse but agreeable to session. No pain reported.  Transitioned EOB with min A for RLE. Threaded pants, RLE airsport aircast and both shoes dependently for time management. Pt stood with min A with R HHA, then mod A to donn pants over hips. Min A stand pivot > w/c. Transported dependently in w/c > gym.   OT assisted with communication between pt and [employer] representative. Assisted pt with calling her sister to confirm DC plans and details including 2 STE the apartment with L handrail, "flight of stairs" with R handrail to upstairs inside the apartment.  OT made the following recommendations for custom w/c to promote pt's functional independence and safety at DC: -k5 lightweight manual w/c -low maintenance skin protectant cushion due to inconsistencies with urinary incontinence and skin integrity risks -1/2 lap tray on Rt side for RUE hemiplegia and flaccidity -full length arm rest to accommodate 1/2 lap tray and 2/2 pt's tendencies to demand eating meals/etc in bed vs sitting functionally -flip back arm rest for squat pivot transfers  Pt remained seated in w/c with direct care handoff to PT at end of session with all needs met.  Therapy Documentation Precautions:  Precautions Precautions: Fall Precaution/Restrictions Comments: PEG, expressive aphasia with agitation, Rt hemi Restrictions Weight Bearing Restrictions Per Provider Order: No    Therapy/Group: Individual Therapy  Melvyn Novas, MS, OTR/L  04/15/2023, 12:10 PM

## 2023-04-15 NOTE — Progress Notes (Signed)
Occupational Therapy Session Note  Patient Details  Name: Jacqueline [name] MRN: [id number] Date of Birth: [birth year]  {CHL IP REHAB OT TIME CALCULATIONS:[id number]}   Short Term Goals: Week 3:  OT Short Term Goal 1 (Week 3): Pt will complete 2/3 toileting steps with Min A for standing balance OT Short Term Goal 2 (Week 3): Pt will donn RUE resting hand splint with min cues OT Short Term Goal 3 (Week 3): Pt will tolerate WB through RUE during functional task for at least 2 mins with min A provided for UE stability  Skilled Therapeutic Interventions/Progress Updates:  Pt greeted *** for skilled OT session with focus on ***.   Pain: Pt reported ***/10 pain, stating "***" in reference to ***. OT offering intermediate rest breaks and positioning suggestions throughout session to address pain/fatigue and maximize participation/safety in session.   Functional Transfers:  Self Care Tasks: Pt completes the following self care tasks with levels of assistance noted below, UB: LB:   Therapeutic Activities:  Therapeutic Exercise:   Education:  Pt remained *** with 4Ps assessed and immediate needs met. Pt continues to be appropriate for skilled OT intervention to promote further functional independence in ADLs/IADLs.    Therapy Documentation Precautions:  Precautions Precautions: Fall Precaution/Restrictions Comments: PEG, expressive aphasia with agitation, Rt hemi Restrictions Weight Bearing Restrictions Per Provider Order: No   Therapy/Group: Individual Therapy  Lou Cal, OTR/L, MSOT  04/15/2023, 9:48 PM

## 2023-04-15 NOTE — Progress Notes (Signed)
Speech Language Pathology Daily Session Note  Patient Details  Name: Jacqueline [name] MRN: [id number] Date of Birth: 11-28-2000  Today's Date: 04/15/2023 SLP Individual Time: 4098-1191 SLP Individual Time Calculation (min): 57 min  Short Term Goals: Week 3: SLP Short Term Goal 1 (Week 3): Patient will follow one step directions with 75% acc given mod multimodal cues SLP Short Term Goal 2 (Week 3): Patient will answer simple yes/ no questions with 60% acc given supervision multimodal cues SLP Short Term Goal 3 (Week 3): Patient will repeat single words with 60% acc with min multimodal cues SLP Short Term Goal 4 (Week 3): Patient will complete automatic speech task with 50% acc given max multimodal cues  Skilled Therapeutic Interventions:   Patient was seen in PM to address expressive and receptive language. Pt alert and on the phone upon SLP arrival. Pt able to verbalize, "this is my sister." After minimal encouragement pt concluded phone call and was agreeable for session. When asked about her family, pt began showing SLP pictures of her siblings. She named 3 siblings indep, verbalized relationship to individuals photoed (I.e. that's my mother and that's my grandmother). In some attempts pt presented with neolgisms and inability to verbalize other sibling names. SLP challenged pt in answering yes/ no questions. Pt answered questions related to family and environment with 60% acc with sup to min A. As session progressed pt stated, "I got to pee." Pt followed one step directions for transfer with 90% acc sup A with success likely due to familiarity of task. Pt was incontinent of bladder and required assist for thorough peri care. Assigned NT arrived and assisted with donning brief. Pt relocated back to bed. She was left with call button within reach and bed alarm active. SLP to continue POC.   Pain Pain Assessment Pain Scale: 0-10 Pain Score: 0-No pain  Therapy/Group: Individual Therapy  Renaee Munda 04/15/2023, 3:54 PM

## 2023-04-15 NOTE — Plan of Care (Signed)
Problem: Consults Goal: RH STROKE PATIENT EDUCATION Description: See Patient Education module for education specifics  Outcome: Progressing   Problem: RH BOWEL ELIMINATION Goal: RH STG MANAGE BOWEL WITH ASSISTANCE Description: STG Manage Bowel with medications with minimal Assistance. Outcome: Progressing   Problem: RH BLADDER ELIMINATION Goal: RH STG MANAGE BLADDER WITH ASSISTANCE Description: STG Manage Bladder with minimal Assistance Outcome: Progressing   Problem: RH SAFETY Goal: RH STG ADHERE TO SAFETY PRECAUTIONS W/ASSISTANCE/DEVICE Description: STG Adhere to Safety Precautions with min A Outcome: Progressing   Problem: RH PAIN MANAGEMENT Goal: RH STG PAIN MANAGED AT OR BELOW PT'S PAIN GOAL Description: <4w/ prns Outcome: Progressing   Problem: RH KNOWLEDGE DEFICIT Goal: RH STG INCREASE KNOWLEDGE OF STROKE PROPHYLAXIS Description: Manage stroke prophylaxis with min A Outcome: Progressing   Problem: RH BOWEL ELIMINATION Goal: RH STG MANAGE BOWEL W/MEDICATION W/ASSISTANCE Description: STG Manage Bowel with Medication with Assistance. Outcome: Progressing

## 2023-04-15 NOTE — Progress Notes (Signed)
Physical Therapy Session Note  Patient Details  Name: Jacqueline [name] MRN: [id number] Date of Birth: 12-Sep-2000  Today's Date: 04/15/2023 PT Individual Time: 8657-8469 and 1300-1355 PT Individual Time Calculation (min): 43 min and 55 min WC evaluation with OT: 1100-1130 - 30 minutes   Short Term Goals: Week 2:  PT Short Term Goal 1 (Week 2): pt will perform all aspects of bed mobility with mod A consistantly PT Short Term Goal 1 - Progress (Week 2): Met PT Short Term Goal 2 (Week 2): pt will transfer bed<>chair with mod A of 1 consistantly PT Short Term Goal 2 - Progress (Week 2): Met PT Short Term Goal 3 (Week 2): pt will ambulate 29ft with LRAD and skilled max A of 1 PT Short Term Goal 3 - Progress (Week 2): Progressing toward goal Week 3:  PT Short Term Goal 1 (Week 3): pt will perform all aspects of bed mobility with min A consistantly PT Short Term Goal 2 (Week 3): pt will transfer bed<>chair with LRAD and min A consistantly PT Short Term Goal 3 (Week 3): pt will ambulate 28ft with LRAD and skilled max A of 1  Skilled Therapeutic Interventions/Progress Updates:   Treatment Session 1 Received pt semi-reclined in bed asleep. Upon wakening, pt encouraged to get OOB for [facility] evaluation and reluctantly agreeable. Pt transferred to EOB with mod A for RLE management. Donned pants, socks, shoes, and Aircast sitting EOB with max A for time management purposes. Stood without AD and mod A and required max A to pull pants over hips, then performed stand<>pivot into WC with heavy min A.   Transported to/from dayroom in [facility] dependently and pt seen for custom WC evaluation with [name] from [employer]. Discussed getting K5 loaner WC with the following features: -RUE half lap tray with full length armrests  -tension adjustable back -ankle adjustable footplate  -low maintenance, skin protectant cushion -brake extenders -flip back and height adjustable armrests  Plan for loaner chair delivery on 2/12  at 2pm. Increased time spent trying to determine D/C plan and home set up. Called sister, Trenton Founds, who reports pt will stay in apartment with 2 STE with handrail on L, then flight of steps to get into apartment with railing on R. Apartment is 2 level with bedroom and bathroom upstairs. Stood from [facility] at staircase and navigated 4 6in steps with L handrail and skilled min A with cues for "up with the good, down with the bad" technique. Pt required assist to advance/clear R toe and light blocking to prevent hyperextension. Pt reports she practiced with [name] and her sister yesterday but was scared of falling with them.   Returned to room and encouraged pt to remain in [facility] for lunch, as this PT will return in 1 hour. Pt adamantly refused, then became frustrated when therapist informed her of remaining sessions for the day. Transferred WC<>bed stand<>pivot to R with CGA (therapist instructing pt to be as independent as possible if she insists on leaving). Removed shoes and transferred into supine with mod A for RLE management. Concluded session with pt semi-reclined in bed, needs within reach, and bed alarm on - pt refusing to allow therapist to leave blinds open and lights on and reporting discomfort in stomach - RN made aware.   Treatment Session 2 Received pt semi-reclined in bed, pt agreeable to PT treatment, and did not complain of pain during session. Session with emphasis on functional mobility/transfers, generalized strengthening and endurance, dynamic standing balance/coordination, and gait training.  Pt transferred semi-reclined<>sitting R EOB with HOB elevated and use of bedrails with close supervision using LLE to assist RLE off EOB. Donned pants and shoes (with aircast sport) with max A and stood with mod A and required max A to pull pants over hips. [employer] rep present during session to determine appropriate bracing and adjustments.  Wearing aircast sport, stood from EOB with hemi walker and CGA and  ambulated 63ft with hemi walker and light min A. Pt with difficulty weight shifting L to clear R foot and demonstrating moderate hyperextension with inability to correct - plan to add toe cap to R shoe. Transported to/from main gym and trialed T strap AFO. Stood from [facility] with hemi-walker and CGA and ambulated additional 2ft with T strap AFO and min A. Noted increased R ankle ER but no difference in hyperextension - pt reported liking aircast sport better than AFO, but would also benefit from Swedish knee cage to control hyperextension. Increased time spent educating pt on reasoning behind recommendation for Swedish knee cage and aircast as pt does not like the way they look - [employer] to add pink straps to aircast.    Pt performed seated BLE strengthening on Kinetron at 50 cm/sec for 1 minute x 4 trials with emphasis on glute/quad strength. Returned to room, transferred WC<>bed via stand<>pivot with CGA and transitioned into supine with min A and cues to use LLE to hook RLE to increase independence. Concluded session with pt semi-reclined in bed, needs within reach, and bed alarm on.   Therapy Documentation Precautions:  Precautions Precautions: Fall Precaution Comments: PEG, expressive aphasia with agitation, Rt hemi Restrictions Weight Bearing Restrictions Per Provider Order: No  Therapy/Group: Individual Therapy Marlana Salvage Zaunegger Blima Rich PT, DPT 04/15/2023, 6:49 AM

## 2023-04-15 NOTE — Progress Notes (Signed)
Patient ID: Jacqueline [name], female   DOB: 08-27-2000, 23 y.o.   MRN: [id number]  Have faxed PCS completed form to [facility] Medicaid so can work on getting pt 3-4 hours M-F once discharged home. Will make CAP referral, aware of long waiting list for this program.

## 2023-04-16 ENCOUNTER — Inpatient Hospital Stay (HOSPITAL_COMMUNITY): Payer: Medicaid Other

## 2023-04-16 NOTE — Progress Notes (Addendum)
Patient ID: Jacqueline [name], female   DOB: 2000-10-09, 23 y.o.   MRN: [id number]  Left message for [name]-sister regarding team conference update and discharge date 2/19. Pt is aware of this and glad to have a discharge date. Await return call.  2:13 Pm Spoke with sister to discuss team conference need for further family training-set up for Monday 2/17 at 1;00-3:00 and where is she going. Sister to talk with and thinks she will discharge to her home and feels [name] can get her up the flight of stairs and then she can stay there until needs to go to follow up appointments. Aware have home health and PCS referral made. Will get back with worker once confirms with pt and will talk with regarding hospital bed-currently pt does not want one.

## 2023-04-16 NOTE — Plan of Care (Signed)
  Problem: Consults Goal: RH STROKE PATIENT EDUCATION Description: See Patient Education module for education specifics  Outcome: Progressing   Problem: RH BOWEL ELIMINATION Goal: RH STG MANAGE BOWEL WITH ASSISTANCE Description: STG Manage Bowel with medications with minimal Assistance. Outcome: Progressing   Problem: RH BLADDER ELIMINATION Goal: RH STG MANAGE BLADDER WITH ASSISTANCE Description: STG Manage Bladder with minimal Assistance Outcome: Progressing   Problem: RH SAFETY Goal: RH STG ADHERE TO SAFETY PRECAUTIONS W/ASSISTANCE/DEVICE Description: STG Adhere to Safety Precautions with min A Outcome: Progressing   Problem: RH PAIN MANAGEMENT Goal: RH STG PAIN MANAGED AT OR BELOW PT'S PAIN GOAL Description: <4w/ prns Outcome: Progressing   Problem: RH KNOWLEDGE DEFICIT Goal: RH STG INCREASE KNOWLEDGE OF STROKE PROPHYLAXIS Description: Manage stroke prophylaxis with min A Outcome: Progressing

## 2023-04-16 NOTE — Progress Notes (Signed)
Occupational Therapy Session Note  Patient Details  Name: Jacqueline [name] MRN: [id number] Date of Birth: 08-Mar-2000  Today's Date: 04/16/2023 OT Individual Time: 1049-1130 OT Individual Time Calculation (min): 41 min    Short Term Goals: Week 3:  OT Short Term Goal 1 (Week 3): Pt will complete 2/3 toileting steps with Min A for standing balance OT Short Term Goal 2 (Week 3): Pt will donn RUE resting hand splint with min cues OT Short Term Goal 3 (Week 3): Pt will tolerate WB through RUE during functional task for at least 2 mins with min A provided for UE stability  Skilled Therapeutic Interventions/Progress Updates:  Skilled OT intervention completed with focus on functional transfers, RUE NMR. Pt received upright in bed, agreeable to session. PEG pain reported; nursing notified of pain med request but not due for meds. OT offered rest breaks and repositioning throughout for pain reduction.  Pt slow going, but more receptive to session this date. Transitioned to EOB with min A for RLE. Donned grip socks dependently. Min A squat pivot > w/c. Transported dependently in w/c <> gym.  OT applied saebo to Rt deltoid with the following parameters: Saebo Stim One 330 pulse width 35 Hz pulse rate On 8 sec/ off 8 sec Ramp up/ down 2 sec Symmetrical Biphasic wave form  Max intensity at 500 Ohm load  Seated at table top, pt completed the following activities to promote NMR and functional use of RUE needed for independence with BADLs, utilizing dycem on foot slide with hand over hand assist provided and stability needed at Rt elbow: Table slides (x30) -shoulder flexion -scaption -elbow flexion/extension Pt with trace activation in all movements this date with good visual feedback and participation; pt smiling afterwards!  Back in room, nurse present for meds with direct care handoff for toileting needs. All needs met at end of session.   Saebo Stim One  Applied for 60 mins  (unattended) to the Rt deltoid for NMR and sublux management with the following parameters:  330 pulse width 35 Hz pulse rate On 8 sec/ off 8 sec Ramp up/ down 2 sec Symmetrical Biphasic wave form  Max intensity at 500 Ohm load  Removed from pt at end of cycle with no adverse skin reaction or irritation noted.    Therapy Documentation Precautions:  Precautions Precautions: Fall Precaution/Restrictions Comments: PEG, expressive aphasia with agitation, Rt hemi Restrictions Weight Bearing Restrictions Per Provider Order: No    Therapy/Group: Individual Therapy  Melvyn Novas, MS, OTR/L  04/16/2023, 12:08 PM

## 2023-04-16 NOTE — Patient Care Conference (Signed)
Inpatient RehabilitationTeam Conference and Plan of Care Update Date: 04/16/2023   Time: 11:33 AM    Patient Name: Jacqueline [name]      Medical Record Number: [id number]  Date of Birth: Jan 10, 2001 Sex: Female         Room/Bed: 4W14C/4W14C-01 Payor Info: Payor: [place] MEDICAID PREPAID HEALTH PLAN / Plan: [place] MEDICAID [employer] / Product Type: *No Product type* /    Admit Date/Time:  03/25/2023  2:38 PM  Primary Diagnosis:  Left middle cerebral artery stroke [facility])  Hospital Problems: Principal Problem:   Left middle cerebral artery stroke (HCC) Active Problems:   Thrombocytopenia (HCC)   Iron deficiency anemia   Lupus anticoagulant disorder (HCC)   SIRS (systemic inflammatory response syndrome) (HCC)   Hyponatremia   Ileus (HCC)   Pain around PEG tube site, initial encounter   Transaminitis   History of pulmonary embolism   Bacteremia    Expected Discharge Date: Expected Discharge Date: 04/23/23  Team Members Present: Physician leading conference: Dr. Sula Soda Social Worker Present: Dossie Der, LCSW Nurse Present: Chana Bode, RN PT Present: Blima Rich, PT OT Present: Candee Furbish, OT SLP Present: Jeannie Done, SLP PPS Coordinator present : Fae Pippin, SLP     Current Status/Progress Goal Weekly Team Focus  Bowel/Bladder   Continent on bowel and bladder. LBM 04/13/23   PT continues to be continent of bowel and bladder.   Give routine and PRN medication for constipation. Toilet q4 hours at hs.    Swallow/Nutrition/ Hydration               ADL's   Min/mod A overall for BADLs   Min A   Barriers- participation, low frustration tolerance, pain level (PEG site, menstrual and R hemibody), perseveration, functional endurance, Rt hemi with spasticity in RUE    Mobility   supine<>sit CGA, sit<>supine min/mod A, sit<>stands with hemi walker CGA, stand<>pivot min/mod A, gait 143ft with hemi walker mod A, 4 6in steps with 1 handrail min A    CGA/supervision, min A steps  barriers: expressive aphasia, frustration/agitation, fatigue, fluctuating motivation levels, decreased standing balance/coordination, R hemiparesis, and pain    Communication   severe expressive language defiicts and mild to mod receptive language deficits   mod A for expressive, min A receptive   Lingraphica trials, automatic speech, yes/ no ques, repetition    Safety/Cognition/ Behavioral Observations               Pain   C/O of abdominal and PEG site pain.   PT states pain level less than 3 out of 10.   Give routine and PRN medication as ordered. Assess pain q shift and PRN.    Skin   Skin intact. PEG site.   Continues to show 0 S/S of infection.  Assess skin q shift and PRN.      Discharge Planning:  Sister and pt's boyfriend were in for education-sister did some hands on. Will need to come back Sister wants pt to stay as long as possible to make progress. Aware of pt's lack of motivation and want to go home. Will need to set a DC date and have family come in for more family education. PCS referral faxed and obtained home health. Will need DME recommedations   Team Discussion: Patient ost left MCA CVA with antiphospholipid syndrome.  Limited by abd. pain and hemorrhoid discomfort.    Patient on target to meet rehab goals: Slow progress noted; limited by participation, fatigue and expressive  language deficits.  Able to complete a sit - supine transfers with min - mod assist and stand pivot transfers ; sit - stand with CGA - min assist.  Able to sit edge of bed with supervision. Completes ADLs with min - mod assist.  Able to ambulate using a hemi walker 121 ' with skilled mod assist and manage 4 steps with a rail and skilled min assist.    *See Care Plan and progress notes for long and short-term goals.   Revisions to Treatment Plan:  Air cast Architectural technologist SABO trial   Teaching Needs: Safety,  medications, skin care, transfers, toileting, etc.   Current Barriers to Discharge: Home enviroment access/layout, Lack of/limited family support, and Behavior  Possible Resolutions to Barriers: Family education OP follow up services DME: Drop arm BSC     Medical Summary Current Status: mennorhagia, abdominal pain, PEG, constipation    Barriers to Discharge Comments: mennorhagia. abdominal pain, PEG, constipation Possible Resolutions to [employer]: continue megace, Hgb ordered but patient is refusing labs, KUB ordered, discussed when PEG can be removed, continue colace   Continued Need for Acute Rehabilitation Level of Care: The patient requires daily medical management by a physician with specialized training in physical medicine and rehabilitation for the following reasons: Direction of a multidisciplinary physical rehabilitation program to maximize functional independence : Yes Medical management of patient stability for increased activity during participation in an intensive rehabilitation regime.: Yes Analysis of laboratory values and/or radiology reports with any subsequent need for medication adjustment and/or medical intervention. : Yes   I attest that I was present, lead the team conference, and concur with the assessment and plan of the team.   Chana Bode B 04/16/2023, 1:52 PM

## 2023-04-16 NOTE — Progress Notes (Signed)
Physical Therapy Session Note  Patient Details  Name: Jacqueline [name] MRN: [id number] Date of Birth: [birth year]  Today's Date: 04/16/2023 PT Individual Time: 0732-0830 PT Individual Time Calculation (min): 58 min   Short Term Goals: Week 2:  PT Short Term Goal 1 (Week 2): pt will perform all aspects of bed mobility with mod A consistantly PT Short Term Goal 1 - Progress (Week 2): Met PT Short Term Goal 2 (Week 2): pt will transfer bed<>chair with mod A of 1 consistantly PT Short Term Goal 2 - Progress (Week 2): Met PT Short Term Goal 3 (Week 2): pt will ambulate 2ft with LRAD and skilled max A of 1 PT Short Term Goal 3 - Progress (Week 2): Progressing toward goal Week 3:  PT Short Term Goal 1 (Week 3): pt will perform all aspects of bed mobility with min A consistantly PT Short Term Goal 2 (Week 3): pt will transfer bed<>chair with LRAD and min A consistantly PT Short Term Goal 3 (Week 3): pt will ambulate 53ft with LRAD and skilled max A of 1  Skilled Therapeutic Interventions/Progress Updates:   Received pt supine in bed asleep in bed with light out. Pt clearly annoyed at therapist's entry this early, covering her face with blankets and stating "no". Therapist proceeded to don socks, airsport aircast to RLE, and shoes with max A while encouraging pt to get OOB to toilet. Session with emphasis on functional mobility/transfers, dressing, toileting, generalized strengthening and endurance, and gait training. Pt transferred semi-reclined<>sitting R EOB with HOB elevated and use of bedrails with close supervision and increased time. Therapist encouraged pt to flatten bed to simulate bed at home, but pt adamantly refused. Donned pants sitting EOB with max A and stood with hemi walker and min A from EOB (cues for hand placement) and required mod A to pull pants over hips. Pt ambulated in/out of bathroom with hemi walker and mod A. Pt appeared to be lacking motivation this morning and experiencing  more hyperextension and minimal buckling of RLE and required mod/max manual facilitation to advance RLE through. Pt required mod A for clothing management and able to void/perform hygiene management without assist. RN present to adminster medications as pt reporting discomfort in stomach from PEG tube.   Pt transported to/from room in [facility] dependently for time management purposes. Donned shoe cover to R foot, stood from [facility] with hemi walker and min A, and ambulated 44ft with hemi walker and heavy min A. Pt demonstrating improvements in ability to clear R foot and required min blocking from R knee hyperextension. Returned to room and strongly encouraged pt to remain sitting up to eat breakfast and for SLP in 30 minutes. Despite encouragement and education on benefits of OOB mobility, pt adamantly refused. Transferred WC<>bed stand<>pivot without AD and CGA. Removed shoes and staff present to discuss and encourage pt to participate with therapy to the fullest.   Therapy Documentation Precautions:  Precautions Precautions: Fall Precaution/Restrictions Comments: PEG, expressive aphasia with agitation, Rt hemi Restrictions Weight Bearing Restrictions Per Provider Order: No  Therapy/Group: Individual Therapy Marlana Salvage Zaunegger Blima Rich PT, DPT 04/16/2023, 6:51 AM

## 2023-04-16 NOTE — Progress Notes (Signed)
Occupational Therapy Weekly Progress Note  Patient Details  Name: Jacqueline [name] MRN: [id number] Date of Birth: 05-31-2000  Beginning of progress report period: April 09, 2023 End of progress report period: April 16, 2023  Patient has met 2 of 3 short term goals. Pt is making gradual progress towards LTGs. She is able to bathe at an overall min A level, dress with mod A and requires min/mod assist for toileting tasks. Pt continues to demonstrate dense R hemiplegia, fluctuating pain levels in R hemi body, dynamic standing balance, GM/FM coordination and awareness deficits resulting in difficulty completing BADL tasks without increased physical assist. Pt will benefit from continued skilled OT services to focus on mentioned deficits. Family ed has been initiated as of date, however pt will require 24/7 care and additional training will be needed in prep for DC. Caregivers requesting an extension to physically prepare and determine home accessibility/plans. Clinically an extension is appropriate at this time due to pt progress, however pt is aware that additional time is solely contingent upon her participation and efforts to maximize therapy outcomes.   Patient continues to demonstrate the following deficits: muscle weakness, decreased cardiorespiratoy endurance, decreased coordination and decreased motor planning, decreased awareness and decreased problem solving, and decreased standing balance and hemiplegia and therefore will continue to benefit from skilled OT intervention to enhance overall performance with BADL and Reduce care partner burden.  Patient progressing toward long term goals..  Continue plan of care.  OT Short Term Goals Week 1:  OT Short Term Goal 1 (Week 1): Pt will sit EOB for ADLs for > 3 mins with mod A for sitting balance OT Short Term Goal 1 - Progress (Week 1): Met OT Short Term Goal 2 (Week 1): Pt will complete functional toilet transfer with max A using LRAD OT  Short Term Goal 2 - Progress (Week 1): Met OT Short Term Goal 3 (Week 1): Pt with attend to RUE during ADL task with min verbal cues during functional task OT Short Term Goal 3 - Progress (Week 1): Met OT Short Term Goal 4 (Week 1): Pt will donn LB clothing with max A using AE PRN OT Short Term Goal 4 - Progress (Week 1): Met Week 2:  OT Short Term Goal 1 (Week 2): Pt will keep RUE in safe space during functional mobility with min questioning cues OT Short Term Goal 1 - Progress (Week 2): Met OT Short Term Goal 2 (Week 2): Pt will complete 2/3 toileting steps with Min A for standing balance OT Short Term Goal 2 - Progress (Week 2): Progressing toward goal OT Short Term Goal 3 (Week 2): Pt will donn UB clothing with mod A using hemi techniques OT Short Term Goal 3 - Progress (Week 2): Met OT Short Term Goal 4 (Week 2): Pt will donn RUE resting hand splint with min cues OT Short Term Goal 4 - Progress (Week 2): Progressing toward goal Week 3:  OT Short Term Goal 1 (Week 3): Pt will complete 2/3 toileting steps with Min A for standing balance OT Short Term Goal 1 - Progress (Week 3): Met OT Short Term Goal 2 (Week 3): Pt will donn RUE resting hand splint with min cues OT Short Term Goal 2 - Progress (Week 3): Progressing toward goal OT Short Term Goal 3 (Week 3): Pt will tolerate WB through RUE during functional task for at least 2 mins with min A provided for UE stability OT Short Term Goal 3 - Progress (Week  3): Met Week 4:  OT Short Term Goal 1 (Week 4): STG = LTG due to ELOS   Jacqueline [name] E [name], MS, OTR/L  04/16/2023, 7:59 AM

## 2023-04-16 NOTE — Progress Notes (Signed)
Nurse asked about patient pain at beginning of shift and educated about new order for witch hazel. Nurse asked if she would like the witchhazel applied the next time she needed to go to bathroom, patient shook head and asked why. Nurse stated that therapy reported rectal pain and witch hazel was ordered, patient kept shaking head no and asking why. Patient then pained to abdomen and PEG site c/o of pain. Denied rectal pain. Patient taking slip gauze on and off PEG site pointing to dried build up around area. Nurse explained that she could clean it and apply new gauze. Nurse cleaned are with normal saline, applied fresh gauze pt tolerated well.

## 2023-04-16 NOTE — Progress Notes (Signed)
 PROGRESS NOTE   Subjective/Complaints: Discussed d/c date of next Wednesday with patient, her boy friend on phone, and with team during conference KUB ordered for abdominal pain  ROS: Limited by aphasia, +right sided weakness, improved appetite, +menses with heavy flow--?resolved, +right upper extremity pain, +rectal pain, abdominal pain   Objective:   No results found.   Recent Labs    04/14/23 1509  WBC 4.5  HGB 9.2*  HCT 27.1*  PLT 68*     Recent Labs    04/14/23 1509  NA 141  K 3.7  CL 110  CO2 17*  GLUCOSE 82  BUN 13  CREATININE 0.93  CALCIUM 9.1      Intake/Output Summary (Last 24 hours) at 04/16/2023 1534 Last data filed at 04/16/2023 0500 Gross per 24 hour  Intake 930 ml  Output --  Net 930 ml          Physical Exam: Vital Signs Blood pressure (!) 93/53, pulse 64, temperature 98 F (36.7 C), resp. rate 16, height 5\' 4"  (1.626 m), weight 72.9 kg, SpO2 98%, unknown if currently breastfeeding.   General: NAD, laying in bed with boyfriend present HEENT: Head is normocephalic, atraumatic, mucous membranes moist Neck: Supple without JVD or lymphadenopathy Heart: Reg rate and rhythm. No murmurs rubs or gallops Chest: CTA bilaterally without wheezes, rales, or rhonchi; no distress Abdomen: Positive bowel sounds, abdomen is soft, nonTTP, G tube c/d/I.   Extremities: No clubbing, cyanosis, or edema. Pulses are 2+ Psych: calmer today than past visits Skin: Clean and intact without signs of breakdown over exposed surfaces, Gtube C/D/I Neuro: expressive aphasia, improving  MsK: RUE/RLE weakness stable but seen moving fingers 2/8! moves LUE/LLE antigravity  Musculoskeletal:     Cervical back: Normal range of motion and neck supple.     Comments: LUE/LLE moving well again gravity RUE- no voluntary movement seen of RUE or RLE   Neurological:     Comments: Patient came back from working with  therapy.  Awake and alert.  She does make eye contact with examiner.  Increased verbal output today. MAS of 3 in R shoulder esp External rotation; MAS of 4 in R elbow- lacking 45 degrees of extension with a lot of work; MAS of 2-3 of R hand- in fist at rest, but could completely open fingers and get past wrist neutral.  Difficult to assess sensation, stable 2/12    Assessment/Plan: 1. Functional deficits which require 3+ hours per day of interdisciplinary therapy in a comprehensive inpatient rehab setting. Physiatrist is providing close team supervision and 24 hour management of active medical problems listed below. Physiatrist and rehab team continue to assess barriers to discharge/monitor patient progress toward functional and medical goals  Care Tool:  Bathing    Body parts bathed by patient: Right arm, Chest, Abdomen, Front perineal area, Buttocks, Right upper leg, Left upper leg, Left lower leg, Face   Body parts bathed by helper: Right lower leg, Left arm     Bathing assist Assist Level: Moderate Assistance - Patient 50 - 74%     Upper Body Dressing/Undressing Upper body dressing   What is the patient wearing?: Pull over shirt  Upper body assist Assist Level: Minimal Assistance - Patient > 75%    Lower Body Dressing/Undressing Lower body dressing      What is the patient wearing?: Pants     Lower body assist Assist for lower body dressing: Moderate Assistance - Patient 50 - 74%     Toileting Toileting    Toileting assist Assist for toileting: Moderate Assistance - Patient 50 - 74%     Transfers Chair/bed transfer  Transfers assist  Chair/bed transfer activity did not occur: Safety/medical concerns (unsafe to get up)  Chair/bed transfer assist level: Minimal Assistance - Patient > 75%     Locomotion Ambulation   Ambulation assist      Assist level: Moderate Assistance - Patient 50 - 74% Assistive device: Walker-hemi Max distance: 156ft   Walk  10 feet activity   Assist     Assist level: Moderate Assistance - Patient - 50 - 74% Assistive device: Walker-hemi   Walk 50 feet activity   Assist Walk 50 feet with 2 turns activity did not occur: Safety/medical concerns  Assist level: Moderate Assistance - Patient - 50 - 74% Assistive device: Walker-hemi    Walk 150 feet activity   Assist Walk 150 feet activity did not occur: Safety/medical concerns         Walk 10 feet on uneven surface  activity   Assist Walk 10 feet on uneven surfaces activity did not occur: Safety/medical concerns         Wheelchair     Assist Is the patient using a wheelchair?: Yes Type of Wheelchair: Manual Wheelchair activity did not occur: Safety/medical concerns  Wheelchair assist level: Supervision/Verbal cueing Max wheelchair distance: 138ft    Wheelchair 50 feet with 2 turns activity    Assist    Wheelchair 50 feet with 2 turns activity did not occur: Safety/medical concerns   Assist Level: Supervision/Verbal cueing   Wheelchair 150 feet activity     Assist  Wheelchair 150 feet activity did not occur: Safety/medical concerns   Assist Level: Supervision/Verbal cueing   Blood pressure (!) 93/53, pulse 64, temperature 98 F (36.7 C), resp. rate 16, height 5\' 4"  (1.626 m), weight 72.9 kg, SpO2 98%, unknown if currently breastfeeding.   Medical Problem List and Plan: 1. 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  Antiphospholipid antibody syndrome: discussed that Eliquis 5mg  BID will help to reduce risk of clotting/strokes, continue  Discussed that Eliquis will increase her menstrual bleeds, continue megace  3. Pain Management: continue Neurontin 100 mg 3 times daily, topamax d/ced given hyperchloremia, oxycodone 5 mg every 4 hours as needed -03/30/23 some tingling in R arm, positional? Manageable with meds. OT working on releasing scapula and helping with positioning; monitor -04/05/23 pain controlled, cont regimen  4. Anxiety: continue Klonopin 0.5 mg nightly, melatonin 5 mg nightly  as needed -antipsychotic agents: suggest Depakote or increase in gabapentin to help calm pt down;  5. Neuropsych/cognition: This patient is capable of making decisions on her own behalf. 6. Skin/Wound Care: routine skin care for PEG and overall skin 7. Fluids/Electrolytes/Nutrition: will assess labs, monitor I&Os, cont supplements -03/29/23 K a little low 3.4, see #18; Na improved. BMP otherwise fairly unremarkable.  -03/30/23 K 4.0 today, BMP otherwise ok today, monitor -04/05/23 K 3.3, will give today, monitor labs Monday  8.  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  Seen by oncology/hematology once in 2024-- needs to f/up  9.  Thrombocytopenia/iron deficiency/folic acid deficiency/B12 deficiency.  Follow-up CBC.  Continue Niferex 150mg  daily, and supplements  -04/05/23 Plt 90, stable, monitor   10.  Dysphagia.  Status post gastrostomy tube 03/21/2023 per interventional radiology.  Diet has been advanced to regular.  D/c TF. Discussed G-tube can be removed after 6-8 weeks from placement date-- discussed this again on 04/12/23, pt antsy to remove it. Discuss this  again with her during d/c planning.   11.  E. coli UTI.  Antibiotic therapy completed 12.  Tobacco/alcohol use.  Counseling 13. Severe RUE spasticity- would strongly suggest Botox of RUE soon as well as resting hand splint for R wrist and R PRAFO- will order.   14. Abd pain- from PEG vs constipation- if no BM, might need KUB.  -IR consult to assess Gtube site.  Pus noted at skin site. Appears CT scan is ordered -Likely G-tube infection.  She is going for a IR procedure today.  Hospitalist team following-appreciate assistance.  She has been started on vancomycin and ceftriaxone for abdominal infection.  Echocardiogram and cultures have been ordered.  Spoke with hospitalist today, ID has been consulted. -1/24 ID recommending vancomycin for 7 days total and can use linezolid if discharged prior to this -03/29/23 appreciate hospitalist help, repeat BCx neg so far, ID signed off; looks like abd xray ordered, pending. Advancing diet as above.  -03/30/23 no abd pain today, other than menstrual cramping, has tylenol if needed; abd xray yesterday fine. Hospitalist still following, no note yet today. Appreciate their assistance.  -04/05/23 still with abd cramping-- no significant tenderness; getting TVUS as below; monitor -04/06/23 no abd pain today, but LBM 04/03/23, cont miralax/colace BID but may need further intervention if no BM by tomorrow -04/12/23 LBM ?2/5 or today (per pt, just not documented); monitor for now, but if no BM by tomorrow, might want to further adjust meds. Refusing some meds intermittently... -04/13/23 LBM this morning according to pt AND boyfriend who's with her; none documented though; hard to know what to believe, but boyfriend corroborating that her LBM was today makes me think it's accurate... so monitor for now.    15.  Elevated transaminases-mild, improved  Kpad ordered to minimize tylenol use  16.  Hyponatremia: resolved, monitor Na weekly. Improved 04/05/23  17.  History of ileus: d/c  feeding supplements and continue regular diet-- see #14 regarding constipation  18. Hypokalemia: klor 40 ordered 1/27, monitor potassium weekly-- see #7  19. Right sided wrist drop: wrist cock-up splint ordered, continue prn  20. Right sided ankle instability: aircast ordered, discussed with PT that it has been received, continue prn  21. Anemia/menorrhagia: discussed stool occult is positive, repeat ordered for today, CBC for today has not yet been drawn, chart reviewed and could be secondary to  menses with started 1/31 -04/05/23 pt with dropping hgb the entire week, heavy menses all week, Hgb down to 6.9 overnight with associated hypotension/tachycardia, got 2U PRBCs. VS improving/stable. Spoke with mom and updated her. Pt understands, too.  -Spoke with OBGYN on call Dr. Nobie Putnam who requested we get a TVUS and start Megace 40mg  BID, continue Eliquis, they will f/up with TVUS once it's done, may need to increase Megace to 80mg  if heavy bleeding persists but for now start with 40mg  BID; monitor closely. F/up CBC after transfusion -04/06/23 post transfusion Hgb 8.5, recheck tomorrow morning; asked that RNs document regarding menstrual flow and clots, to monitor how Megace is helping; must monitor closely; pelvic US done transabdominally only due to pt refusal, no abnormalities seen. Reach out to OBGYN if further assistance is needed Monitor CBC weekly -04/12/23 pt states her menses stopped, no documentation anywhere about this-- weekday team might want to touch base with OBGYN to see what the plan for her Megace is... Hgb reviewed and improved to 9.2  22/ Hyperchloremia: may be 2/2 to dehydration from heavy menstrual bleeds, s/p IVF, d/c topamax, labs ordered but patient refused  23. Hypotension: s/p IVF with improvement, continue to monitor BP TID   Vitals:   04/12/23 1959 04/13/23 0526 04/13/23 1200 04/13/23 1613  BP: (!) 91/49 (!) 95/54 (!) 115/95 98/68   04/13/23 1949 04/14/23 0507 04/14/23 1530  04/14/23 2014  BP: (!) 102/55 (!) 105/46 102/63 (!) 97/52   04/15/23 0457 04/15/23 1430 04/15/23 2003 04/16/23 0539  BP: (!) 95/53 107/66 100/66 (!) 93/53       24. Rectal pain: with hazel pads ordered  LOS: 22 days A FACE TO FACE EVALUATION WAS PERFORMED  Drema Pry Savion Washam 04/16/2023, 3:34 PM

## 2023-04-16 NOTE — Progress Notes (Signed)
Speech Language Pathology Daily Session Note  Patient Details  Name: Jacqueline [name] MRN: [id number] Date of Birth: 2000/07/26  Today's Date: 04/16/2023 SLP Individual Time: 0904-1000 SLP Individual Time Calculation (min): 56 min  Short Term Goals: Week 3: SLP Short Term Goal 1 (Week 3): Patient will follow one step directions with 75% acc given mod multimodal cues SLP Short Term Goal 2 (Week 3): Patient will answer simple yes/ no questions with 60% acc given supervision multimodal cues SLP Short Term Goal 3 (Week 3): Patient will repeat single words with 60% acc with min multimodal cues SLP Short Term Goal 4 (Week 3): Patient will complete automatic speech task with 50% acc given max multimodal cues  Skilled Therapeutic Interventions:  Patient was seen in am to address expressive and receptive language. Pt was alert and seen at bedside. She was agreeable for session. Pt initially expressing frustration. Through additional time and patience, SLP able to gather pt frustration regarding her children's current living arrangement causing her to worry. She accurately identified her date of admission to hospital via showing SLP a video taken from the day she was hospitalized. Given active listening and encouragement pt was subsequently redirected to more structured task. Pt answered yes/no questions regarding personal information with 67% acc with supervision. She was further challenged to repeat single words [name], [place], [place], [place]) which she completed with 25% acc with max A. She participated in automatic speech task with max A and <10% acc. Utilizing We[employer] chat app on SLP's phone, pt located food options, colors, places, and holidays with min to mod A. At conclusion of session, pt was left in bed with call button within reach and bed alarm active. SLP to continue POC.   Pain Pain Assessment Pain Scale: 0-10 Pain Score: 0-No pain Faces Pain Scale: No hurt Pain Type: Acute pain Pain  Location: Abdomen Pain Descriptors / Indicators: Discomfort;Pressure Pain Frequency: Constant Pain Onset: On-going Patients Stated Pain Goal: 0 Pain Intervention(s): Medication (See eMAR)  Therapy/Group: Individual Therapy  Renaee Munda 04/16/2023, 11:47 AM

## 2023-04-17 LAB — CBC
HCT: 26.1 % — ABNORMAL LOW (ref 36.0–46.0)
Hemoglobin: 8.9 g/dL — ABNORMAL LOW (ref 12.0–15.0)
MCH: 28.2 pg (ref 26.0–34.0)
MCHC: 34.1 g/dL (ref 30.0–36.0)
MCV: 82.6 fL (ref 80.0–100.0)
Platelets: 57 10*3/uL — ABNORMAL LOW (ref 150–400)
RBC: 3.16 MIL/uL — ABNORMAL LOW (ref 3.87–5.11)
RDW: 17.1 % — ABNORMAL HIGH (ref 11.5–15.5)
WBC: 3.9 10*3/uL — ABNORMAL LOW (ref 4.0–10.5)
nRBC: 0 % (ref 0.0–0.2)

## 2023-04-17 MED ORDER — DOCUSATE SODIUM 100 MG PO CAPS
100.0000 mg | ORAL_CAPSULE | Freq: Three times a day (TID) | ORAL | Status: DC
  Administered 2023-04-17 – 2023-04-21 (×9): 100 mg via ORAL
  Filled 2023-04-17 (×13): qty 1

## 2023-04-17 NOTE — Progress Notes (Signed)
Occupational Therapy Session Note  Patient Details  Name: Jacqueline [name] MRN: [id number] Date of Birth: 03/25/2000  Today's Date: 04/17/2023 OT Individual Time: 1003-1100 OT Individual Time Calculation (min): 57 min     Skilled Therapeutic Interventions/Progress Updates: Patient received resting in bed. Agreeable to getting OOB for OT treatment in the therapy gym. Patient needed Mod assist with transition from supine to pivot to w/c. Propelled patient to gym for RUE NMRE starting with scapular mobilization and assisted forward reach. Patient with reported pain at end range at about 150 degrees shoulder flexion with good scapular rhythm. Patient able to initiate movement into forward reach with cuing at triceps and distal support against gravity. Horizontal adduction/ abduction tolerated well with trace muscle initiation into ABD, but able to move through 50% ROM with reduced gravity into adduction. Patient unable to tolerate pre reach wt bearing due to pain at wrist when in only 30 degrees extension. Mobilization to carpels, into supination/pronation and wrist deviation, flex and ext. Patient with improved tolerance of pressure throughout the heel of the hand following mobilization. Patient encouraged to perform self ROM and be mindful of wrist position when sitting in the w/c or in bed. Patient with ability to initiate grasp grossly, but unable to initiate extensors without muscle tapping and facilitation. Continue with skilled OT treatments for further gains of the RUE.      Therapy Documentation Precautions:  Precautions Precautions: Fall Precaution/Restrictions Comments: PEG, expressive aphasia with agitation, Rt hemi Restrictions Weight Bearing Restrictions Per Provider Order: No General:   Vital Signs: Therapy Vitals Temp: 98.3 F (36.8 C) Temp Source: Oral Pulse Rate: 96 BP: (!) 100/50 Patient Position (if appropriate): Sitting Oxygen Therapy SpO2: 100 % O2 Device: Room  Air Pain:   ADL: ADL Eating: Minimal assistance Where Assessed-Eating: Bed level Grooming: Moderate assistance (simulated/with clinical judgement due to pt status with high pain level) Where Assessed-Grooming: Bed level Upper Body Bathing: Moderate assistance (simulated/with clinical judgement due to pt status with high pain level) Where Assessed-Upper Body Bathing: Bed level Lower Body Bathing: Dependent (simulated/with clinical judgement due to pt status with high pain level) Where Assessed-Lower Body Bathing: Bed level Upper Body Dressing: Maximal assistance (simulated/with clinical judgement due to pt status with high pain level) Where Assessed-Upper Body Dressing: Bed level Lower Body Dressing: Dependent (simulated/with clinical judgement due to pt status with high pain level) Where Assessed-Lower Body Dressing: Bed level Toileting: Dependent Where Assessed-Toileting: Bed level Toilet Transfer: Unable to assess Tub/Shower Transfer: Unable to assess Tub/Shower Transfer Method: Unable to assess Psychologist, counselling Transfer: Unable to assess Film/video editor Method: Unable to assess    Therapy/Group: Individual Therapy  Warnell Forester 04/17/2023, 10:58 AM

## 2023-04-17 NOTE — Progress Notes (Signed)
Speech Language Pathology Daily Session Note  Patient Details  Name: Jacqueline [name] MRN: [id number] Date of Birth: Feb 06, 2001  Today's Date: 04/17/2023 SLP Individual Time: 1000-1100 SLP Individual Time Calculation (min): 60 min  Short Term Goals: Week 3: SLP Short Term Goal 1 (Week 3): Patient will follow one step directions with 75% acc given mod multimodal cues SLP Short Term Goal 1 - Progress (Week 3): Met SLP Short Term Goal 2 (Week 3): Patient will answer simple yes/ no questions with 60% acc given supervision multimodal cues SLP Short Term Goal 2 - Progress (Week 3): Met SLP Short Term Goal 3 (Week 3): Patient will repeat single words with 60% acc with min multimodal cues SLP Short Term Goal 3 - Progress (Week 3): Not met SLP Short Term Goal 4 (Week 3): Patient will complete automatic speech task with 50% acc given max multimodal cues SLP Short Term Goal 4 - Progress (Week 3): Met  Skilled Therapeutic Interventions:   Pt greeted at bedside. She was Facetiming her boyfriend upon SLP arrival. During initial conversation, she was able to answer simple Y/N questions re their relationship w/ 80% accuracy given s cues (typically repetition of the question for clarification). After Facetime was completed, she followed one step directions re her immediate environment w/ modA visual cues. She answered open ended biographical questions re biographical information w/ maxA. Open ended responses and spontaneous utterances typically comprised of empty neologisms/apraxic distortions with adequate intonation for conversation. She completed automatic phrases w/ maxA visual/verbal cues to maintain 50% accuracy. Reduced success noted during environmental responsive naming task, as pt required max/DEP visual/verbal cues during 7/8 trials. At the end of tx tasks, she was left in her bed with her alarm set and call light within reach. Recommend cont ST per POC.   Pain Pain Assessment Pain Scale: 0-10 Pain  Score: 10-Worst pain ever Pain Type: Acute pain Pain Location: Abdomen Pain Orientation: Mid Pain Radiating Towards: n/a Pain Descriptors / Indicators: Aching Pain Frequency: Constant Pain Onset: On-going Patients Stated Pain Goal: 0 Pain Intervention(s): Medication (See eMAR) Multiple Pain Sites: No  Therapy/Group: Individual Therapy  [name] 04/17/2023, 5:59 PM

## 2023-04-17 NOTE — Progress Notes (Signed)
Physical Therapy Session Note  Patient Details  Name: Jacqueline [name] MRN: [id number] Date of Birth: [birth year]  Today's Date: 04/17/2023 PT Individual Time: 0800-0827 PT Individual Time Calculation (min): 27 min   Short Term Goals: Week 3:  PT Short Term Goal 1 (Week 3): pt will perform all aspects of bed mobility with min A consistantly PT Short Term Goal 2 (Week 3): pt will transfer bed<>chair with LRAD and min A consistantly PT Short Term Goal 3 (Week 3): pt will ambulate 26ft with LRAD and skilled max A of 1  Skilled Therapeutic Interventions/Progress Updates:     Treatment Session 1   Pt supine in bed upon arrival. Pt agreeable to therapy. Pt denies any pain.   Pt incontinent of bladder. Therapist encouraging pt to perform stand pivot trasnfer to get cleaned up however pt agitated and refusing despite encouragement and education.   Pt performed rolling to R and bridging technique (pt refusing to roll to L) to donn/doff brief with total A, pt performed pericare with set up assist. Therapist providing assist to bend and stabilize R LE for bridging technique.   Supine to sit with supervision/min A for R LE, pt feeding pants through R leg with total assist as pt refusing to lean forward to donn despite encouragement. Pt performed sit to stand with min A with therapist facilitating anterior weight shift, and lateral weight shift to R LE. Pt donned pants over buttocks with R UE while standing with CGA/min A.   Pt refusing to get in chair. Pt requesting to eat breakfast in bed.   Pt performed lateral scooting to R with min A with therapist facilitating active use of R LE (anterior weight shift, and lateral weight shift to R LE.   Sit to supine with supervision.   Pt supine in bed at end of session with all needs within reach and bed alarm on.       Therapy Documentation Precautions:  Precautions Precautions: Fall Precaution/Restrictions Comments: PEG, expressive aphasia with  agitation, Rt hemi Restrictions Weight Bearing Restrictions Per Provider Order: No   Therapy/Group: Individual Therapy  [facility] [place], [place], DPT  04/17/2023, 8:29 AM

## 2023-04-17 NOTE — Plan of Care (Signed)
  Problem: Consults Goal: RH STROKE PATIENT EDUCATION Description: See Patient Education module for education specifics  Outcome: Progressing   Problem: RH BOWEL ELIMINATION Goal: RH STG MANAGE BOWEL WITH ASSISTANCE Description: STG Manage Bowel with medications with minimal Assistance. Outcome: Progressing   Problem: RH SAFETY Goal: RH STG ADHERE TO SAFETY PRECAUTIONS W/ASSISTANCE/DEVICE Description: STG Adhere to Safety Precautions with min A Outcome: Progressing   Problem: RH PAIN MANAGEMENT Goal: RH STG PAIN MANAGED AT OR BELOW PT'S PAIN GOAL Description: <4w/ prns Outcome: Progressing   Problem: RH KNOWLEDGE DEFICIT Goal: RH STG INCREASE KNOWLEDGE OF STROKE PROPHYLAXIS Description: Manage stroke prophylaxis with min A Outcome: Progressing

## 2023-04-17 NOTE — Progress Notes (Signed)
Patient is visibly upset and pointing to PEG tube when nurse walks into room. Difficult to understand everything patient is saying due to speech, but clearly says yes/no. Nurse asked, "your tired of your PEG tube?" Pt stated, "yes, dont know what its there." Then grimaced and pointed to tube again. Nurse clarified, " your tired of it, it's bothering you and you want it out." Patient shakes head yes. Nurse asked if she has discussed with provider. Patient nodded yes, but appears frustrated. Nurse educated patient and provided emotional support. Patient appeared calmer. Call light within reach.

## 2023-04-17 NOTE — Progress Notes (Signed)
Nutrition Follow-up  DOCUMENTATION CODES:   Not applicable  INTERVENTION:  Continue to encourage oral intake Boost Breeze po TID, each supplement provides 250 kcal and 9 grams of protein Multivitamin Magic cup TID with meals, each supplement provides 290 kcal and 9 grams of protein    NUTRITION DIAGNOSIS:   Inadequate oral intake related to lethargy/confusion as evidenced by meal completion < 50%.  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  Review of documented oral intake 40-100% x 4 meals.  It is reported that family will bring food in for patient.  Weight is trending stable.  Labs and meds reviewed.  Possible discharge home soon.   Suspect current nutr poc providing adequate nutrition.    12/20 - admitted with expressive aphasia and right sided weakness (previous night drank significant amount of ETOH and took Suboxone) dx with L MCA and watershed territory infarcts with failed attempt at mechanical thrombectomy.  12/26 - MRI showed increased infarcts.  1/17 - PEG placed 1/21 - admitted to rehab 1/22 - Bolus TF stopped due to misplaced PEG 1/23- PEG tube replaced, 1/24 -  trickle feeds due to ileus  1/27 - D/C TF  04/05/23 still with abd cramping-- no significant tenderness; getting TVUS as below; monitor 04/06/23 no abd pain today, but LBM 04/03/23, cont miralax/colace BID but may need further intervention if no BM by tomorrow 04/12/23 LBM ?2/5 or today (per pt, just not documented); monitor for now, but if no BM by tomorrow, might  want to further adjust meds. Refusing some meds intermittently... 04/13/23 LBM this morning according to pt AND boyfriend who's with her; none documented though;   Hospital weight history:  04/16/23 1614 75.5 kg 166.45 lbs  04/09/23 0811 72.9 kg 160.72 lbs  03/25/23 1400 77.1 kg 169.97 lbs     NUTRITION - FOCUSED PHYSICAL EXAM:  Flowsheet Row Most Recent Value  Orbital Region No depletion  Upper Arm Region No depletion  Thoracic and Lumbar Region No depletion  Buccal Region No depletion  Temple Region No depletion  Clavicle Bone Region No depletion  Clavicle and Acromion Bone Region No depletion  Scapular Bone Region No depletion  Dorsal Hand No depletion  Patellar Region No depletion  Anterior Thigh Region No depletion  Posterior Calf Region No depletion  Edema (RD Assessment) None  Hair Reviewed  Eyes Reviewed  Mouth Reviewed  Skin Reviewed  Nails Reviewed       Diet Order:   Diet Order             Diet regular Room service appropriate? Yes with Assist; Fluid consistency: Thin  Diet effective now                   EDUCATION NEEDS:   No education needs have been identified at this time  Skin:  Skin Assessment: Reviewed RN Assessment  Last BM:  04/16/23 type 1  Height:   Ht Readings from Last 1 Encounters:  03/25/23 5\' 4"  (1.626 m)    Weight:   Wt Readings from Last 1  Encounters:  04/16/23 75.5 kg    Ideal Body Weight:  54.5 kg  BMI:  Body mass index is 28.57 kg/m.  Estimated Nutritional Needs:   Kcal:  2000-2200  Protein:  100-115 grams  Fluid:  >2L/day    Jamelle Haring RDN, LDN Clinical Dietitian   If unable to reach, please contact "[facility] Inpatient" secure chat group between 8 am-4 pm daily"

## 2023-04-17 NOTE — Progress Notes (Signed)
PROGRESS NOTE   Subjective/Complaints: No new complaints this morning Discussed that KUB shows moderate stool burden, have increased colace to TID Appreciate SW assistance with PCS services  ROS: Limited by aphasia, +right sided weakness, improved appetite, +menses with heavy flow--?resolved, +right upper extremity pain, +rectal pain, abdominal pain   Objective:   DG Abd 1 View Result Date: 04/16/2023 CLINICAL DATA:  Midline abdominal pain EXAM: ABDOMEN - 1 VIEW COMPARISON:  03/29/2023 FINDINGS: Gastric tube again noted projecting over the left abdomen. Nonobstructive bowel gas pattern. No organomegaly or free air. Moderate stool burden. Visualized lung bases clear. IMPRESSION: No bowel obstruction or free air. Moderate stool burden. Electronically Signed   By: Charlett Nose M.D.   On: 04/16/2023 18:29     Recent Labs    04/14/23 1509 04/17/23 1204  WBC 4.5 3.9*  HGB 9.2* 8.9*  HCT 27.1* 26.1*  PLT 68* 57*     Recent Labs    04/14/23 1509  NA 141  K 3.7  CL 110  CO2 17*  GLUCOSE 82  BUN 13  CREATININE 0.93  CALCIUM 9.1      Intake/Output Summary (Last 24 hours) at 04/17/2023 1401 Last data filed at 04/16/2023 2104 Gross per 24 hour  Intake 840 ml  Output --  Net 840 ml          Physical Exam: Vital Signs Blood pressure (!) 100/50, pulse 96, temperature 98.3 F (36.8 C), temperature source Oral, resp. rate 18, height 5\' 4"  (1.626 m), weight 75.5 kg, SpO2 100%, unknown if currently breastfeeding.   General: NAD, laying in bed with boyfriend present HEENT: Head is normocephalic, atraumatic, mucous membranes moist Neck: Supple without JVD or lymphadenopathy Heart: Reg rate and rhythm. No murmurs rubs or gallops Chest: CTA bilaterally without wheezes, rales, or rhonchi; no distress Abdomen: Positive bowel sounds, abdomen is soft, nonTTP, G tube c/d/I.   Extremities: No clubbing, cyanosis, or edema.  Pulses are 2+ Psych: calmer today than past visits Skin: Clean and intact without signs of breakdown over exposed surfaces, Gtube C/D/I Neuro: expressive aphasia, improving  MsK: RUE/RLE weakness stable but seen moving fingers 2/8! moves LUE/LLE antigravity  Musculoskeletal:     Cervical back: Normal range of motion and neck supple.     Comments: LUE/LLE moving well again gravity RUE- no voluntary movement seen of RUE or RLE   Neurological:     Comments: Patient came back from working with therapy.  Awake and alert.  She does make eye contact with examiner.  Increased verbal output today. MAS of 3 in R shoulder esp External rotation; MAS of 4 in R elbow- lacking 45 degrees of extension with a lot of work; MAS of 2-3 of R hand- in fist at rest, but could completely open fingers and get past wrist neutral.  Difficult to assess sensation, stable 2/13    Assessment/Plan: 1. Functional deficits which require 3+ hours per day of interdisciplinary therapy in a comprehensive inpatient rehab setting. Physiatrist is providing close team supervision and 24 hour management of active medical problems listed below. Physiatrist and rehab team continue to assess barriers to discharge/monitor patient progress toward functional and medical goals  Care Tool:  Bathing    Body parts bathed by patient: Right arm, Chest, Abdomen, Front perineal area, Buttocks, Right upper leg, Left upper leg, Left lower leg, Face   Body parts bathed by helper: Right lower leg, Left arm     Bathing assist Assist Level: Moderate Assistance - Patient 50 - 74%     Upper Body Dressing/Undressing Upper body dressing   What is the patient wearing?: Pull over shirt    Upper body assist Assist Level: Minimal Assistance - Patient > 75%    Lower Body Dressing/Undressing Lower body dressing      What is the patient wearing?: Pants     Lower body assist Assist for lower body dressing: Moderate Assistance - Patient 50 -  74%     Toileting Toileting    Toileting assist Assist for toileting: Moderate Assistance - Patient 50 - 74%     Transfers Chair/bed transfer  Transfers assist  Chair/bed transfer activity did not occur: Safety/medical concerns (unsafe to get up)  Chair/bed transfer assist level: Minimal Assistance - Patient > 75%     Locomotion Ambulation   Ambulation assist      Assist level: Moderate Assistance - Patient 50 - 74% Assistive device: Walker-hemi Max distance: 150ft   Walk 10 feet activity   Assist     Assist level: Moderate Assistance - Patient - 50 - 74% Assistive device: Walker-hemi   Walk 50 feet activity   Assist Walk 50 feet with 2 turns activity did not occur: Safety/medical concerns  Assist level: Moderate Assistance - Patient - 50 - 74% Assistive device: Walker-hemi    Walk 150 feet activity   Assist Walk 150 feet activity did not occur: Safety/medical concerns         Walk 10 feet on uneven surface  activity   Assist Walk 10 feet on uneven surfaces activity did not occur: Safety/medical concerns         Wheelchair     Assist Is the patient using a wheelchair?: Yes Type of Wheelchair: Manual Wheelchair activity did not occur: Safety/medical concerns  Wheelchair assist level: Supervision/Verbal cueing Max wheelchair distance: 172ft    Wheelchair 50 feet with 2 turns activity    Assist    Wheelchair 50 feet with 2 turns activity did not occur: Safety/medical concerns   Assist Level: Supervision/Verbal cueing   Wheelchair 150 feet activity     Assist  Wheelchair 150 feet activity did not occur: Safety/medical concerns   Assist Level: Supervision/Verbal cueing   Blood pressure (!) 100/50, pulse 96, temperature 98.3 F (36.8 C), temperature source Oral, resp. rate 18, height 5\' 4"  (1.626 m), weight 75.5 kg, SpO2 100%, unknown if currently breastfeeding.   Medical Problem List and Plan: 1. Functional deficits  secondary to left MCA territory infarction status post unsuccessful IR revascularization/thrombectomy             -patient may  shower- cover PEG             -ELOS/Goals: 3-4 weeks mod to max A             -Continue CIR  Discussed prognosis for recovery -encouraged patient to maximize her participation with therapy, commended her on ambulating Chaplain consulted to change POA to older sister Mack Hook Will provide note regarding hospitalization for her court case regarding her eviction -discussed estimated discharge date with sister -discussed plan for caregiver training on Monday -discussed with team therapy refusals  2.  Antiphospholipid antibody syndrome: discussed  that Eliquis 5mg  BID will help to reduce risk of clotting/strokes, continue  Discussed that Eliquis will increase her menstrual bleeds, continue megace  3. Pain Management: continue Neurontin 100 mg 3 times daily, topamax d/ced given hyperchloremia, oxycodone 5 mg every 4 hours as needed -03/30/23 some tingling in R arm, positional? Manageable with meds. OT working on releasing scapula and helping with positioning; monitor -04/05/23 pain controlled, cont regimen  4. Anxiety: continue Klonopin 0.5 mg nightly, melatonin 5 mg nightly as needed -antipsychotic agents: suggest Depakote or increase in gabapentin to help calm pt down;  5. Neuropsych/cognition: This patient is capable of making decisions on her own behalf. 6. Skin/Wound Care: routine skin care for PEG and overall skin 7. Fluids/Electrolytes/Nutrition: will assess labs, monitor I&Os, cont supplements -03/29/23 K a little low 3.4, see #18; Na improved. BMP otherwise fairly unremarkable.  -03/30/23 K 4.0 today, BMP otherwise ok today, monitor -04/05/23 K 3.3, will give today, monitor labs Monday  8.  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  Seen by oncology/hematology once in 2024-- needs to f/up  9.  Thrombocytopenia/iron deficiency/folic acid deficiency/B12 deficiency.  Follow-up CBC.  Continue Niferex 150mg  daily, and supplements  -04/05/23 Plt 90, stable, monitor   10.  Dysphagia.  Status post gastrostomy tube 03/21/2023 per interventional radiology.  Diet has been advanced to regular.  D/c TF. Discussed G-tube can be removed after 6-8 weeks from placement date-- discussed this again on 04/12/23, pt antsy to remove it. Discuss this again with her during d/c planning.   11.  E. coli UTI.  Antibiotic therapy completed 12.  Tobacco/alcohol use.  Counseling 13. Severe RUE spasticity- would strongly suggest Botox of RUE soon as well as resting hand splint for R wrist and R PRAFO- will order.   14. Abd pain- from PEG vs constipation- if no BM, might need KUB.  -IR consult to assess Gtube site.  Pus noted at skin site. Appears CT scan is ordered -Likely G-tube infection.  She is going for a IR procedure today.  Hospitalist team following-appreciate assistance.  She has been started on vancomycin and ceftriaxone for abdominal infection.  Echocardiogram and cultures have been ordered.  Spoke with hospitalist today, ID has been consulted. -1/24 ID recommending vancomycin for 7 days total and can use linezolid if discharged prior to this -03/29/23 appreciate hospitalist help, repeat BCx neg so far, ID signed off; looks like abd xray ordered, pending. Advancing diet as above.  -03/30/23 no abd pain today, other than menstrual cramping, has tylenol if needed; abd xray yesterday fine. Hospitalist still following, no note yet today. Appreciate their assistance.  -04/05/23 still with abd cramping-- no significant tenderness; getting TVUS as below; monitor -04/06/23 no abd pain today, but LBM 04/03/23, cont miralax/colace BID but may need further intervention if no BM by tomorrow -04/12/23 LBM ?2/5 or today (per pt, just not  documented); monitor for now, but if no BM by tomorrow, might want to further adjust meds. Refusing some meds intermittently... -04/13/23 LBM this morning according to pt AND boyfriend who's with her; none documented though; hard to know what to believe, but boyfriend corroborating that her LBM was today makes me think it's accurate... so monitor for now.    15.  Elevated transaminases-mild, improved  Kpad ordered to minimize tylenol use  16.  Hyponatremia: resolved, monitor Na weekly. Improved 04/05/23  17.  History of ileus: d/c feeding supplements and continue regular diet-- see #14 regarding constipation  18. Hypokalemia: klor 40 ordered 1/27, monitor potassium weekly-- see #7  19. Right sided wrist drop: wrist cock-up splint ordered, continue prn  20. Right sided ankle instability: aircast ordered, discussed with PT that it has been received, continue prn  21. Anemia/menorrhagia: discussed stool occult is positive, repeat ordered for today, CBC for today has not yet been drawn, chart reviewed and could be secondary to menses with started 1/31 -04/05/23 pt with dropping hgb the entire week, heavy menses all week, Hgb down to 6.9 overnight with associated hypotension/tachycardia, got 2U PRBCs. VS improving/stable. Spoke with mom and updated her. Pt understands, too.  -Spoke with OBGYN on call Dr. Nobie Putnam who requested we get a TVUS and start Megace 40mg  BID, continue Eliquis, they will f/up with TVUS once it's done, may need to increase Megace to 80mg  if heavy bleeding persists but for now start with 40mg  BID; monitor closely. F/up CBC after transfusion -04/06/23 post transfusion Hgb 8.5, recheck tomorrow morning; asked that RNs document regarding menstrual flow and clots, to monitor how Megace is helping; must monitor closely; pelvic US done transabdominally only due to pt refusal, no abnormalities seen. Reach out to OBGYN if further assistance is needed Monitor CBC weekly -04/12/23 pt states her  menses stopped, no documentation anywhere about this-- weekday team might want to touch base with OBGYN to see what the plan for her Megace is... Hgb reviewed and is stable at 8.9  22/ Hyperchloremia: may be 2/2 to dehydration from heavy menstrual bleeds, s/p IVF, d/c topamax, labs ordered but patient refused  23. Hypotension: s/p IVF with improvement, continue to monitor BP TID   Vitals:   04/13/23 1949 04/14/23 0507 04/14/23 1530 04/14/23 2014  BP: (!) 102/55 (!) 105/46 102/63 (!) 97/52   04/15/23 0457 04/15/23 1430 04/15/23 2003 04/16/23 0539  BP: (!) 95/53 107/66 100/66 (!) 93/53   04/16/23 1607 04/16/23 2104 04/17/23 0420 04/17/23 0850  BP: 101/60 (!) 102/57 (!) 87/52 (!) 100/50       24. Rectal pain: with hazel pads ordered, continue prn  25. Abdominal pain: KUB reviewed and shows moderate stool burden, colace increased to TID  LOS: 23 days A FACE TO FACE EVALUATION WAS PERFORMED  Sophiarose Eades P Santhiago Collingsworth 04/17/2023, 2:01 PM

## 2023-04-17 NOTE — Progress Notes (Signed)
SLP Cancellation Note  Patient Details Name: Jacqueline [name] MRN: [id number] DOB: 23-Dec-2000   Cancelled treatment:        Patient missed 45 minutes of therapy time d/t refusal. Upon SLP entry, patient stated "I've already had 2 of you, why are you back", referencing frequency of therapy sessions from the day. SLP explained goal of 3 hours of therapy per day and the desires of her family members for her to participate in all therapies in order for her to regain as much independence as possible prior to discharge. Despite encouragement, patient continued to state "I'm not doing it."  SLP will continue to target goals per plan of care.                                                                                               Jeannie Done, M.A., CCC-SLP   Yetta Barre 04/17/2023, 3:11 PM

## 2023-04-17 NOTE — Progress Notes (Signed)
Patient ID: Jacqueline [name], female   DOB: 11/03/2000, 23 y.o.   MRN: [id number] Spoke with [facility] [employer] to do phone assessment for pt's care needs in order for her to get PCS services at discharge. She will reach out to pt's sister to see five days vs seven days of service and her pref regarding agency.

## 2023-04-17 NOTE — Progress Notes (Signed)
Pt refused labs this morning. Educated patient.

## 2023-04-17 NOTE — Progress Notes (Signed)
Speech Language Pathology Weekly Progress Note  Patient Details  Name: Jacqueline [name] MRN: [id number] Date of Birth: 22-Jun-2000  Beginning of progress report period: April 10, 2023 End of progress report period: April 17, 2023  Short Term Goals: Week 3: SLP Short Term Goal 1 (Week 3): Patient will follow one step directions with 75% acc given mod multimodal cues SLP Short Term Goal 1 - Progress (Week 3): Met SLP Short Term Goal 2 (Week 3): Patient will answer simple yes/ no questions with 60% acc given supervision multimodal cues SLP Short Term Goal 2 - Progress (Week 3): Met SLP Short Term Goal 3 (Week 3): Patient will repeat single words with 60% acc with min multimodal cues SLP Short Term Goal 3 - Progress (Week 3): Not met SLP Short Term Goal 4 (Week 3): Patient will complete automatic speech task with 50% acc given max multimodal cues SLP Short Term Goal 4 - Progress (Week 3): Met  New Short Term Goals: Week 4: SLP Short Term Goal 1 (Week 4): STGs=LTGs d/t elos  Weekly Progress Updates: Patient has made excellent progress towards therapy goals meeting 3/4 short term goals set this reporting period. Patient follows one-step directions with 75%+ accuracy given mod cues, answers yes/no questions with 60%+ acc given supervision, and completed automatic speech tasks with 50% accuracy given max multimodal cues. Patient continues to require total cues to repeat single functional words. Patient and family education ongoing. Patient will continue to benefit from skilled therapy services during remainder of CIR stay.    Intensity: Minumum of 1-2 x/day, 30 to 90 minutes Frequency: 3 to 5 out of 7 days Duration/Length of Stay: 2/19 Treatment/Interventions: Dysphagia/aspiration precaution training;Internal/external aids;Cognitive remediation/compensation;Speech/Language facilitation;Cueing hierarchy;Environmental controls;Therapeutic Activities;Multimodal communication  approach;Patient/family education;Functional tasks  Jeannie Done, M.A., CCC-SLP  Yetta Barre 04/17/2023, 3:34 PM

## 2023-04-18 ENCOUNTER — Inpatient Hospital Stay (HOSPITAL_COMMUNITY): Payer: Medicaid Other

## 2023-04-18 NOTE — Progress Notes (Signed)
Brief Neuro Update:  Reviewed MRI Brain, shows completion of earlier noted L MCA likely due to failure of colaterals. This would not explain the earlier noted diplopia episode that has resolved. I discussed the images with Dr. Pearlean Brownie with the stroke team too who has seen the patient in the past. No further inpatient neurologic workup. Follow up outpatient with stroke clinic.   Erick Blinks [facility]

## 2023-04-18 NOTE — Progress Notes (Addendum)
Occupational Therapy Session Note  Patient Details  Name: Jacqueline [name] MRN: [id number] Date of Birth: 08-Jan-2001  Today's Date: 04/18/2023 OT Individual Time: 1005-1055 OT Individual Time Calculation (min): 50 min  and Today's Date: 04/18/2023 OT Missed Time: 10 Minutes Missed Time Reason: CT/MRI   Short Term Goals: Week 4:  OT Short Term Goal 1 (Week 4): STG = LTG due to ELOS  Skilled Therapeutic Interventions/Progress Updates:  Skilled OT intervention completed with focus on functional transfers, sit > stands, and vision assessment. Pt received semi supine in bed, agreeable to session. No pain reported.  Pt was alert and eager for session this AM. Transitioned to EOB with supervision with + time for RLE management. Pt with poor recall of hemi technique for LB dressing, requiring assist to donn RLE first, then donned grip socks dependently and stood with min A, however pt able to use LUE to donn pants over hips with CGA only for dynamic standing balance. Min A stand pivot > w/c. Transported dependently in w/c <> gym.   Min A stand pivot > EOM. Began to set RUE on UE ranger for NMR, when pt made a c/o "ew I don't like that feeling" with pt using L hand to cover L eye. Upon inquiry, pt reported pressure behind L eye and new double vision, which pt reports started this morning when she woke up. Upon diplopia assessment, pt did report seeing 2 fingers when 1 was present, and did report resolved symptoms with coverage of L eye. Pt has never c/o this with this OT before, and despite pt appearing physically baseline, OT notified MD/PA and care team with order made for stat CT and request for pt to return to bed. BP assessed sitting EOM as 106/72, HR 93 bpm. Min A squat pivot > w/c.  Back in room, min A squat pivot > EOB, mod A bed mobility. Donned RUE resting hand splint dependently. Discussed with pt importance of notifying medical team with new symptoms as they begin vs delaying as it could delay  her care as well due to her history of stroke.   Pt remained semi upright in bed, with bed alarm on/activated, and with all needs in reach at end of session. Pt missed 15 mins of OT intervention secondary to change in status/CT; OT will make up missed time as able.    Therapy Documentation Precautions:  Precautions Precautions: Fall Precaution/Restrictions Comments: PEG, expressive aphasia with agitation, Rt hemi Restrictions Weight Bearing Restrictions Per Provider Order: No    Therapy/Group: Individual Therapy  Melvyn Novas, MS, OTR/L  04/18/2023, 12:15 PM

## 2023-04-18 NOTE — Progress Notes (Signed)
SLP Cancellation Note  Patient Details Name: MALINDA MAYDEN MRN: [id number] DOB: October 20, 2000  Cancelled treatment:        SLP attempted to conduct 60 minute ST session targeting communication goals, however in discussion with OT and nursing team, patient unavailable due to medical hold. Code Stroke called and patient taken for emergent imaging. SLP will continue to follow as able.                                                                                            Jeannie Done, M.A., CCC-SLP  Morrie Sheldon A Elie Gragert 04/18/2023, 12:00 PM

## 2023-04-18 NOTE — Consult Note (Addendum)
NEUROLOGY CONSULT NOTE   Date of service: April 18, 2023 Patient Name: Jacqueline [name] MRN:  [id number] DOB:  2001/01/12 Chief Complaint: "double vision" Requesting Provider: Horton Chin, MD  History of Present Illness  Jacqueline [name] is a 23 y.o. 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  CODE STROKE was called by rehab due to acute onset of double vision while working with OT.  On assessment in CT, patient had inconsistent responses for visual field testing. No confusion, continued right hemiplegia and aphasia continues.   Recent Hospital Events: 12/20: Admitted to [facility]. Mechanical thrombectomy unsuccessful 12/26: Repeat MRI showed stroke extension 1/3: Stroke team signed off. Heparin IV continued until able to swallow, then on eliquis 1/21: Discharged to CIR  LKW: 0930 2/14 Modified rankin score: 4-Needs assistance to walk and tend to bodily needs IV Thrombolysis: No, acute stroke patient EVT: No, no LVO suspected  NIHSS components Score: Comment  1a Level of Conscious 0[x]  1[]  2[]  3[]      1b LOC Questions 0[]  1[]  2[x]       1c LOC Commands 0[]  1[x]  2[]       2 Best Gaze 0[x]  1[]  2[]       3 Visual 0[]  1[]  2[x]  3[]      4 Facial Palsy 0[]  1[x]  2[]  3[]      5a Motor Arm - left 0[x]  1[]  2[]  3[]  4[]  UN[]    5b Motor Arm - Right 0[]  1[]  2[]  3[]  4[x]  UN[]    6a Motor Leg - Left 0[x]  1[]  2[]  3[]  4[]  UN[]    6b Motor Leg - Right 0[]  1[]  2[]  3[x]  4[]  UN[]    7 Limb Ataxia 0[x]  1[]  2[]  3[]  UN[]     8 Sensory 0[]  1[x]  2[]  UN[]      9 Best Language 0[]  1[]  2[x]  3[]      10 Dysarthria 0[x]  1[]  2[]  UN[]      11 Extinct. and Inattention 0[x]  1[]  2[]       TOTAL:   16      ROS   Unable to ascertain due to  aphasia  Past History   Past Medical History:  Diagnosis Date   Bacterial vaginitis 07/10/2016   Candida vaginitis 07/24/2016   Headache in pregnancy, antepartum, third trimester 07/24/2016   Kell isoimmunization during pregnancy     Past Surgical History:  Procedure Laterality Date   CESAREAN SECTION N/A 09/04/2016   Procedure: CESAREAN SECTION;  Surgeon: Levie Heritage, DO;  Location: [facility];  Service: Obstetrics;  Laterality: N/A;   CESAREAN SECTION Bilateral    IR CT HEAD LTD  02/21/2023   IR GASTROSTOMY TUBE MOD SED  03/21/2023   IR PERCUTANEOUS ART THROMBECTOMY/INFUSION INTRACRANIAL INC DIAG ANGIO  02/21/2023   IR REPLC GASTRO/COLONIC TUBE PERCUT W/FLUORO  03/27/2023   RADIOLOGY WITH ANESTHESIA N/A 02/21/2023   Procedure: IR WITH ANESTHESIA;  Surgeon: Julieanne Cotton, MD;  Location: [facility] OR;  Service: Radiology;  Laterality: N/A;    Family History: Family History  Problem Relation Age of Onset   Hypertension Maternal Grandmother    Diabetes Maternal Grandmother    Cancer Neg Hx     Social History  reports that she has been smoking cigars. She has never used smokeless tobacco. She reports current alcohol use. She reports that  she does not use drugs.  No Known Allergies  Medications   Current Facility-Administered Medications:    acetaminophen (TYLENOL) tablet 650 mg, 650 mg, Oral, Q4H PRN, 650 mg at 04/14/23 1828 **OR** acetaminophen (TYLENOL) 160 MG/5ML solution 650 mg, 650 mg, Per Tube, Q4H PRN, 650 mg at 03/26/23 0029 **OR** acetaminophen (TYLENOL) suppository 650 mg, 650 mg, Rectal, Q4H PRN, Angiulli, Mcarthur Rossetti, PA-C   apixaban (ELIQUIS) tablet 5 mg, 5 mg, Oral, BID, Street, [place], [place], 5 mg at 04/18/23 1610   bisacodyl (DULCOLAX) suppository 10 mg, 10 mg, Rectal, Daily PRN, Angiulli, Mcarthur Rossetti, PA-C, 10 mg at 03/26/23 1134   clonazePAM (KLONOPIN) tablet 0.5 mg, 0.5 mg, Oral, QHS, Raulkar, Drema Pry, MD, 0.5 mg at 04/17/23 2140   cyanocobalamin (VITAMIN  B12) tablet 2,000 mcg, 2,000 mcg, Oral, Daily, Paytes, Austin A, RPH, 2,000 mcg at 04/18/23 9604   docusate sodium (COLACE) capsule 100 mg, 100 mg, Oral, TID, Raulkar, Drema Pry, MD, 100 mg at 04/18/23 5409   feeding supplement (BOOST / RESOURCE BREEZE) liquid 1 Container, 1 Container, Oral, BID BM, Raulkar, Drema Pry, MD, 1 Container at 04/16/23 1416   folic acid (FOLVITE) tablet 1 mg, 1 mg, Oral, Daily, Paytes, Austin A, RPH, 1 mg at 04/18/23 8119   gabapentin (NEURONTIN) capsule 100 mg, 100 mg, Oral, TID, Angiulli, Mcarthur Rossetti, PA-C, 100 mg at 04/18/23 1478   hydrOXYzine (ATARAX) tablet 25 mg, 25 mg, Oral, TID PRN, Raulkar, Drema Pry, MD, 25 mg at 04/17/23 0001   iron polysaccharides (NIFEREX) capsule 150 mg, 150 mg, Oral, Daily, Raulkar, Drema Pry, MD, 150 mg at 04/18/23 2956   melatonin tablet 5 mg, 5 mg, Oral, QHS PRN, Raulkar, Drema Pry, MD, 5 mg at 04/17/23 0001   multivitamin with minerals tablet 1 tablet, 1 tablet, Oral, Daily, Raulkar, Drema Pry, MD, 1 tablet at 04/18/23 2130   oxyCODONE (Oxy IR/ROXICODONE) immediate release tablet 5 mg, 5 mg, Oral, Q4H PRN, Raulkar, Drema Pry, MD, 5 mg at 04/18/23 0529   polyethylene glycol (MIRALAX / GLYCOLAX) packet 17 g, 17 g, Oral, BID, Raulkar, Drema Pry, MD, 17 g at 04/16/23 2103   simethicone (MYLICON) 40 MG/0.6ML suspension 40 mg, 40 mg, Oral, QID, Raulkar, Drema Pry, MD, 40 mg at 04/17/23 0858   sorbitol 70 % solution 30 mL, 30 mL, Oral, Daily PRN, Paytes, Austin A, RPH, 30 mL at 04/16/23 2104   witch hazel-glycerin (TUCKS) pad, , Topical, PRN, Horton Chin, MD  Vitals   Vitals:   04/17/23 1623 04/17/23 2103 04/18/23 0537 04/18/23 1104  BP: 101/61 (!) 94/57 (!) 100/58 99/62  Pulse: 86  74 84  Resp: 18 16 16 18   Temp: 98.4 F (36.9 C) 98.6 F (37 C) 97.9 F (36.6 C) 98.1 F (36.7 C)  TempSrc: Oral Oral Oral Oral  SpO2: 100% 100% 100% 100%  Weight:      Height:        Body mass index is 28.57 kg/m.  Physical Exam   General:  Laying comfortably in bed; in no acute distress.  HENT: Normal oropharynx and mucosa. Normal external appearance of ears and nose.  Neck: Supple, no pain or tenderness  CV: No JVD. No peripheral edema.  Pulmonary: Symmetric Chest rise. Normal respiratory effort.  Abdomen: Soft to touch, non-tender.  Ext: No cyanosis, edema, or deformity  Skin: No rash. Normal palpation of skin.   Musculoskeletal: Normal digits and nails by inspection. No clubbing.   Neurologic Examination  Mental status/Cognition: Alert,  oriented to self, unable to answer orientation questions due to aphasia. Speech/language: At times, she is fluent, only comprehends some simple commands. Is able to fluently express her needs. Cranial nerves:   CN II Pupils equal and reactive to light, inconsistent responses in R hemivisual field.   CN III,IV,VI EOM intact, no gaze preference or deviation, no nystagmus   CN V normal sensation in V1, V2, and V3 segments bilaterally   CN VII no asymmetry, no nasolabial fold flattening   CN VIII normal hearing to speech    CN IX & X normal palatal elevation, no uvular deviation    CN XI 5/5 head turn and 5/5 shoulder shrug bilaterally    CN XII midline tongue protrusion    Motor:  Muscle bulk: normal, tone flaccid in RUE Mvmt Root Nerve  Muscle Right Left Comments  SA C5/6 Ax Deltoid 0 5   EF C5/6 Mc Biceps 0 5   EE C6/7/8 Rad Triceps 0 5   WF C6/7 Med FCR     WE C7/8 PIN ECU     F Ab C8/T1 U ADM/FDI 0 5   HF L1/2/3 Fem Illopsoas 1 5   KE L2/3/4 Fem Quad 2 5   DF L4/5 D Peron Tib Ant 0 5   PF S1/2 Tibial Grc/Sol 0 5    Sensation:  Light touch Decreased in RUE and RLE   Pin prick    Temperature    Vibration   Proprioception    Coordination/Complex Motor:  - Finger to Nose intact on the left - Heel to shin unable to do - Rapid alternating movement are intact on the left - Gait: deferred.  Labs/Imaging/Neurodiagnostic studies   CBC:  Recent Labs  Lab 05/04/23 1509  04/17/23 1204  WBC 4.5 3.9*  HGB 9.2* 8.9*  HCT 27.1* 26.1*  MCV 83.6 82.6  PLT 68* 57*   Basic Metabolic Panel:  Lab Results  Component Value Date   NA 141 05/04/23   K 3.7 2023-05-04   CO2 17 (L) 05/04/2023   GLUCOSE 82 04-May-2023   BUN 13 05-04-2023   CREATININE 0.93 May 04, 2023   CALCIUM 9.1 05/04/23   GFRNONAA >60 04-May-2023   GFRAA >60 12/06/2016   Lipid Panel:  Lab Results  Component Value Date   LDLCALC 42 02/22/2023   HgbA1c:  Lab Results  Component Value Date   HGBA1C 4.4 (L) 02/21/2023   Urine Drug Screen:     Component Value Date/Time   LABOPIA NONE DETECTED 02/21/2023 1153   COCAINSCRNUR NONE DETECTED 02/21/2023 1153   LABBENZ NONE DETECTED 02/21/2023 1153   AMPHETMU NONE DETECTED 02/21/2023 1153   THCU NONE DETECTED 02/21/2023 1153   LABBARB NONE DETECTED 02/21/2023 1153    Alcohol Level     Component Value Date/Time   ETH 14 (H) 02/21/2023 1042   INR  Lab Results  Component Value Date   INR 1.1 03/21/2023   APTT  Lab Results  Component Value Date   APTT >200 (HH) 03/20/2023   AED levels: No results found for: "PHENYTOIN", "ZONISAMIDE", "LAMOTRIGINE", "LEVETIRACETA"  CT Head without contrast(Personally reviewed): No hemorrhage or CT evidence of an acute cortical infarct. Evolving left MCA territory infarct, not significantly changed from prior exam.  MRI Brain(Personally reviewed): pending   ASSESSMENT   Jacqueline [name] is a 23 y.o. 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   CODE STROKE was called by rehab due to acute onset of double vision while working with OT. This was subjective diplopia with no objective deficit noted.  On assessment in CT,  patient had inconsistent responses for visual field testing. No confusion, continued right hemiplegia and aphasia continues. No objective diplopia noted. With her aphasia, it is incredibly difficult to get much history about diplopia noted earlier. Will get MRI Brain.  RECOMMENDATIONS  - MRI Brain  If negative, no further workup indicated.  ______________________________________________________________________    Pt seen by Neuro NP/APP and later by MD. Note/plan to be edited by MD as needed.    Lynnae January, DNP, AGACNP-BC [facility] Please use AMION for contact information & EPIC for messaging.  NEUROHOSPITALIST ADDENDUM Performed a face to face diagnostic evaluation.   I have reviewed the contents of history and physical exam as documented by PA/ARNP/Resident and agree with above documentation.  I have discussed and formulated the above plan as documented. Edits to the note have been made as needed.  Erick Blinks, MD [facility] [phone]   If 7pm to 7am, please call on call as listed on AMION.

## 2023-04-18 NOTE — Progress Notes (Signed)
Pt working with OT therapy in therapy gym and had new onset of double vision. Pt brought back to room. Vitals stable. MD aware and activated a Code stroke. New CT ordered. Charge nurse aware. This nurse transported pt to CT and MRI .    Tori Milks LPN

## 2023-04-18 NOTE — Code Documentation (Signed)
Stroke Response Nurse Documentation Code Documentation  Jacqueline [name] is a 23 y.o. 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 On No antithrombotic. Code stroke was activated by rehabilitation physician .   Patient on rehabunit where she was LKW at [phone] and now complaining of double vision . Per OT, patient had an acute onset of double vision with in therapy.   Stroke team at the bedside after patient activation. Patient to CT with team. NIHSS 16, see documentation for details and code stroke times. Patient with disoriented, not following commands, right hemianopia, right facial droop, right arm weakness, right leg weakness, right decreased sensation, and Expressive aphasia  on exam. The following imaging was completed:  CT Head and MRI. Patient is not a candidate for IV Thrombolytic due to recent prior stroke per MD. Patient is not a candidate for IR due to no LVO noted on imaging per MD.   Care/Plan: VS/NIHSS q2hr x12hr, then q4hr.   Bedside handoff with RN Joni Reining.    Felecia Jan  Stroke Response RN

## 2023-04-18 NOTE — Progress Notes (Signed)
Physical Therapy Session Note  Patient Details  Name: Jacqueline [name] MRN: [id number] Date of Birth: 12/06/2000  Today's Date: 04/18/2023 PT Individual Time: 1348-1500 PT Individual Time Calculation (min): 72 min   Short Term Goals: Week 3:  PT Short Term Goal 1 (Week 3): pt will perform all aspects of bed mobility with min A consistantly PT Short Term Goal 2 (Week 3): pt will transfer bed<>chair with LRAD and min A consistantly PT Short Term Goal 3 (Week 3): pt will ambulate 46ft with LRAD and skilled max A of 1  Skilled Therapeutic Interventions/Progress Updates: Pt presents semi-reclined in bed and agreeable to therapy.  Pt transferred sup to sit w/ supervision, manually bringing RLE to EOB.  PT donned airsport cast and shoes w/ Total A although pt leans forward to assure self that toes are all the way forward.  PT threaded pants over LES and pt pulls to hips.  Pt transfers sit to stand w/ min A although PT positions RLE for safe transfer.  Pt pulls pants over hips w/ steadying.  Pt steps to w/c w/ HW and cues for management of HW.  Pt educated on turns and positioning to avoid falls, but minimal carry-over to next situation.  Pt wheeled to dayroom.  Pt amb w/ HW and min A, occasional facilitation for weight shift L.  Education given for sequencing, but pt resistant to changing.  Pt amb x 40' including turn to return to seat, continued ed for [facility] management especially w/ turns to keep out of path.  Pt amb x 175' w/ same, cues for posture and visual scanning to avoid objects. Pt amb to room w/ HW and min Aw/ increased cues for approach to bed.  Pt transferred sit to supine w/ min A only for RLE.  Pt able to scoot up in bed w/ railing and hooklying position, PT assist to maintain RLE position.  Pt bridged w/ same assist for RLE and able to remove pants.  Pt sat long sitting to remove socks.  Bed alarm on and all needs in reach.     Therapy Documentation Precautions:  Precautions Precautions:  Fall Precaution/Restrictions Comments: PEG, expressive aphasia with agitation, Rt hemi Restrictions Weight Bearing Restrictions Per Provider Order: No General:   Vital Signs:   Pain:0/10     Therapy/Group: Individual Therapy  Lucio Edward 04/18/2023, 3:09 PM

## 2023-04-18 NOTE — Progress Notes (Signed)
 PROGRESS NOTE   Subjective/Complaints: Patient had no new complaints this morning When working with [facility] OT she reported new onset diplopia, stat head CT ordered and discussed with code stroke team  ROS: Limited by aphasia, +right sided weakness, improved appetite, +menses with heavy flow--?resolved, +right upper extremity pain, +rectal pain, abdominal pain, +new onset double vision   Objective:   DG Abd 1 View Result Date: 04/16/2023 CLINICAL DATA:  Midline abdominal pain EXAM: ABDOMEN - 1 VIEW COMPARISON:  03/29/2023 FINDINGS: Gastric tube again noted projecting over the left abdomen. Nonobstructive bowel gas pattern. No organomegaly or free air. Moderate stool burden. Visualized lung bases clear. IMPRESSION: No bowel obstruction or free air. Moderate stool burden. Electronically Signed   By: Charlett Nose M.D.   On: 04/16/2023 18:29     Recent Labs    04/17/23 1204  WBC 3.9*  HGB 8.9*  HCT 26.1*  PLT 57*     No results for input(s): "NA", "K", "CL", "CO2", "GLUCOSE", "BUN", "CREATININE", "CALCIUM" in the last 72 hours.     Intake/Output Summary (Last 24 hours) at 04/18/2023 1131 Last data filed at 04/18/2023 0934 Gross per 24 hour  Intake 660 ml  Output --  Net 660 ml          Physical Exam: Vital Signs Blood pressure 99/62, pulse 84, temperature 98.1 F (36.7 C), temperature source Oral, resp. rate 18, height 5\' 4"  (1.626 m), weight 75.5 kg, SpO2 100%, unknown if currently breastfeeding.   General: NAD, laying in bed with boyfriend present HEENT: Head is normocephalic, atraumatic, mucous membranes moist Neck: Supple without JVD or lymphadenopathy Heart: Reg rate and rhythm. No murmurs rubs or gallops Chest: CTA bilaterally without wheezes, rales, or rhonchi; no distress Abdomen: Positive bowel sounds, abdomen is soft, nonTTP, G tube c/d/I.   Extremities: No clubbing, cyanosis, or edema. Pulses are  2+ Psych: calmer today than past visits Skin: Clean and intact without signs of breakdown over exposed surfaces, Gtube C/D/I Neuro: expressive aphasia, improving  MsK: RUE/RLE weakness stable but seen moving fingers 2/8! moves LUE/LLE antigravity  Musculoskeletal:     Cervical back: Normal range of motion and neck supple.     Comments: LUE/LLE moving well again gravity RUE- no voluntary movement seen of RUE or RLE   Neurological:     Comments: Patient came back from working with therapy.  Awake and alert.  She does make eye contact with examiner.  Increased verbal output today. MAS of 3 in R shoulder esp External rotation; MAS of 4 in R elbow- lacking 45 degrees of extension with a lot of work; MAS of 2-3 of R hand- in fist at rest, but could completely open fingers and get past wrist neutral.  Difficult to assess sensation, stable 2/14    Assessment/Plan: 1. Functional deficits which require 3+ hours per day of interdisciplinary therapy in a comprehensive inpatient rehab setting. Physiatrist is providing close team supervision and 24 hour management of active medical problems listed below. Physiatrist and rehab team continue to assess barriers to discharge/monitor patient progress toward functional and medical goals  Care Tool:  Bathing    Body parts bathed by patient: Right arm,  Chest, Abdomen, Front perineal area, Buttocks, Right upper leg, Left upper leg, Left lower leg, Face   Body parts bathed by helper: Right lower leg, Left arm     Bathing assist Assist Level: Moderate Assistance - Patient 50 - 74%     Upper Body Dressing/Undressing Upper body dressing   What is the patient wearing?: Pull over shirt    Upper body assist Assist Level: Minimal Assistance - Patient > 75%    Lower Body Dressing/Undressing Lower body dressing      What is the patient wearing?: Pants     Lower body assist Assist for lower body dressing: Moderate Assistance - Patient 50 - 74%      Toileting Toileting    Toileting assist Assist for toileting: Moderate Assistance - Patient 50 - 74%     Transfers Chair/bed transfer  Transfers assist  Chair/bed transfer activity did not occur: Safety/medical concerns (unsafe to get up)  Chair/bed transfer assist level: Minimal Assistance - Patient > 75%     Locomotion Ambulation   Ambulation assist      Assist level: Moderate Assistance - Patient 50 - 74% Assistive device: Walker-hemi Max distance: 123ft   Walk 10 feet activity   Assist     Assist level: Moderate Assistance - Patient - 50 - 74% Assistive device: Walker-hemi   Walk 50 feet activity   Assist Walk 50 feet with 2 turns activity did not occur: Safety/medical concerns  Assist level: Moderate Assistance - Patient - 50 - 74% Assistive device: Walker-hemi    Walk 150 feet activity   Assist Walk 150 feet activity did not occur: Safety/medical concerns         Walk 10 feet on uneven surface  activity   Assist Walk 10 feet on uneven surfaces activity did not occur: Safety/medical concerns         Wheelchair     Assist Is the patient using a wheelchair?: Yes Type of Wheelchair: Manual Wheelchair activity did not occur: Safety/medical concerns  Wheelchair assist level: Supervision/Verbal cueing Max wheelchair distance: 184ft    Wheelchair 50 feet with 2 turns activity    Assist    Wheelchair 50 feet with 2 turns activity did not occur: Safety/medical concerns   Assist Level: Supervision/Verbal cueing   Wheelchair 150 feet activity     Assist  Wheelchair 150 feet activity did not occur: Safety/medical concerns   Assist Level: Supervision/Verbal cueing   Blood pressure 99/62, pulse 84, temperature 98.1 F (36.7 C), temperature source Oral, resp. rate 18, height 5\' 4"  (1.626 m), weight 75.5 kg, SpO2 100%, unknown if currently breastfeeding.   Medical Problem List and Plan: 1. 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 IR revascularization/thrombectomy             -patient may  shower- cover PEG             -ELOS/Goals: 3-4 weeks mod to max A             -Continue CIR  Discussed prognosis for recovery -encouraged patient to maximize her participation with therapy, commended her on ambulating Chaplain consulted to change POA to older sister Mack Hook Will provide note regarding hospitalization for her court case regarding her eviction -discussed estimated discharge date with sister -discussed plan for caregiver training on Monday -discussed with team therapy refusals Stat head CT ordered 2/14 for new onset double vision  2.  Antiphospholipid antibody syndrome: discussed that Eliquis 5mg  BID will  help to reduce risk of clotting/strokes, continue  Discussed that Eliquis will increase her menstrual bleeds, d/c megace given increased stroke risk with this  3. Pain Management: continue Neurontin 100 mg 3 times daily, topamax d/ced given hyperchloremia, oxycodone 5 mg every 4 hours as needed -03/30/23 some tingling in R arm, positional? Manageable with meds. OT working on releasing scapula and helping with positioning; monitor -04/05/23 pain controlled, cont regimen  4. Anxiety: continue Klonopin 0.5 mg nightly, melatonin 5 mg nightly as needed -antipsychotic agents: suggest Depakote or increase in gabapentin to help calm pt down;  5. Neuropsych/cognition: This patient is capable of making decisions on her own behalf. 6. Skin/Wound Care: routine skin care for PEG and overall skin 7. Fluids/Electrolytes/Nutrition: will assess labs, monitor I&Os, cont supplements -03/29/23 K a little low 3.4, see #18; Na improved. BMP otherwise fairly unremarkable.  -03/30/23 K 4.0 today, BMP otherwise ok today, monitor -04/05/23 K 3.3, will give today, monitor labs Monday  8.  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  Seen by oncology/hematology once in 2024-- needs to f/up  9.  Thrombocytopenia/iron deficiency/folic acid deficiency/B12 deficiency.  Follow-up CBC.  Continue Niferex 150mg  daily, and supplements  -04/05/23 Plt 90, stable, monitor   10.  Dysphagia.  Status post gastrostomy tube 03/21/2023 per interventional radiology.  Diet has been advanced to regular.  D/c TF. Discussed G-tube can be removed after 6-8 weeks from placement date-- discussed this again on 04/12/23, pt antsy to remove it. Discuss this again with her during d/c planning.   11.  E. coli UTI.  Antibiotic therapy completed 12.  Tobacco/alcohol use.  Counseling 13. Severe RUE spasticity- would strongly suggest Botox of RUE soon as well as resting hand splint for R wrist and R PRAFO- will order.   14. Abd pain- from PEG vs constipation- if no BM, might need KUB.  -IR consult to assess Gtube site.  Pus noted at skin site. Appears CT scan is ordered -Likely G-tube infection.  She is going for a IR procedure today.  Hospitalist team following-appreciate assistance.  She has been started on vancomycin and ceftriaxone for abdominal infection.  Echocardiogram and cultures have been ordered.  Spoke with hospitalist today, ID has been consulted. -1/24 ID recommending vancomycin for 7 days total and can use linezolid if discharged prior to this -03/29/23 appreciate hospitalist help, repeat BCx neg so far, ID signed off; looks like abd xray ordered, pending. Advancing diet as above.  -03/30/23 no abd pain today, other than menstrual cramping, has tylenol if needed; abd xray yesterday fine. Hospitalist still following, no note yet today. Appreciate their assistance.  -04/05/23 still with abd cramping-- no significant tenderness; getting TVUS as below; monitor -04/06/23 no abd pain today, but LBM 04/03/23, cont miralax/colace BID but may need further  intervention if no BM by tomorrow -04/12/23 LBM ?2/5 or today (per pt, just not documented); monitor for now, but if no BM by tomorrow, might want to further adjust meds. Refusing some meds intermittently... -04/13/23 LBM this morning according to pt AND boyfriend who's with her; none documented though; hard to know what to believe, but boyfriend corroborating that her LBM was today makes me think it's accurate... so monitor for now.    15.  Elevated transaminases-mild, improved  Kpad ordered to minimize tylenol use  16.  Hyponatremia: resolved, monitor Na weekly. Improved 04/05/23  17.  History of ileus: d/c feeding supplements and continue regular diet-- see #14 regarding constipation  18. Hypokalemia: klor 40 ordered 1/27, monitor potassium weekly-- see #7  19. Right sided wrist drop: wrist cock-up splint ordered, continue prn  20. Right sided ankle instability: aircast ordered, discussed with PT that it has been received, continue prn  21. Anemia/menorrhagia: discussed stool occult is positive, repeat ordered for today, CBC for today has not yet been drawn, chart reviewed and could be secondary to menses with started 1/31 -04/05/23 pt with dropping hgb the entire week, heavy menses all week, Hgb down to 6.9 overnight with associated hypotension/tachycardia, got 2U PRBCs. VS improving/stable. Spoke with mom and updated her. Pt understands, too.  -Spoke with OBGYN on call Dr. Nobie Putnam who requested we get a TVUS and start Megace 40mg  BID, continue Eliquis, they will f/up with TVUS once it's done, may need to increase Megace to 80mg  if heavy bleeding persists but for now start with 40mg  BID; monitor closely. F/up CBC after transfusion -04/06/23 post transfusion Hgb 8.5, recheck tomorrow morning; asked that RNs document regarding menstrual flow and clots, to monitor how Megace is helping; must monitor closely; pelvic US done transabdominally only due to pt refusal, no abnormalities seen. Reach out to OBGYN  if further assistance is needed Monitor CBC weekly -04/12/23 pt states her menses stopped, no documentation anywhere about this-- weekday team might want to touch base with OBGYN to see what the plan for her Megace is... Hgb reviewed and is stable at 8.9  22/ Hyperchloremia: may be 2/2 to dehydration from heavy menstrual bleeds, s/p IVF, d/c topamax, labs ordered but patient refused  23. Hypotension: s/p IVF with improvement, continue to monitor BP TID   Vitals:   04/15/23 0457 04/15/23 1430 04/15/23 2003 04/16/23 0539  BP: (!) 95/53 107/66 100/66 (!) 93/53   04/16/23 1607 04/16/23 2104 04/17/23 0420 04/17/23 0850  BP: 101/60 (!) 102/57 (!) 87/52 (!) 100/50   04/17/23 1623 04/17/23 2103 04/18/23 0537 04/18/23 1104  BP: 101/61 (!) 94/57 (!) 100/58 99/62       24. Rectal pain: with hazel pads ordered, continue prn  25. Abdominal pain: KUB reviewed and shows moderate stool burden, colace increased to TID  LOS: 24 days A FACE TO FACE EVALUATION WAS PERFORMED  Clint Bolder P Saidah Kempton 04/18/2023, 11:31 AM

## 2023-04-19 ENCOUNTER — Other Ambulatory Visit (HOSPITAL_COMMUNITY): Payer: Self-pay

## 2023-04-19 NOTE — Progress Notes (Addendum)
 PROGRESS NOTE   Subjective/Complaints:  Pt doing fine, slept ok, denies pain, LBM yesterday, urinating fine per pt, denies any other complaints or concerns although history limited d/t aphasia.   ROS: Limited by aphasia, +right sided weakness, improved appetite, +menses with heavy flow--resolved, +right upper extremity pain-improved, +rectal pain-not reported today, abdominal pain- denied today, +new onset double vision- not reported   Objective:   MR BRAIN WO CONTRAST Result Date: 04/18/2023 CLINICAL DATA:  Neuro deficit, acute, stroke suspected. EXAM: MRI HEAD WITHOUT CONTRAST TECHNIQUE: Multiplanar, multiecho pulse sequences of the brain and surrounding structures were obtained without intravenous contrast. COMPARISON:  MRI of the brain February 27, 2023. FINDINGS: Brain: Large area of encephalomalacia and gliosis in the left frontotemporal parietal and insular region consistent with known prior left MCA territory infarct. Within that area, multiple small focal areas of restricted diffusion are seen, suggestive acute on chronic process. Other areas of encephalomalacia and gliosis are seen in the left frontal lobe as well as in the left caudate and right ACA/MCA watershed region. Hemosiderin deposit is seen in left temporal lobe. No hydrocephalus, extra-axial collection mass lesion. Vascular: Normal flow voids. Skull and upper cervical spine: Diffuse decrease of the T1 signal of the visualized osseous structures, may represent red marrow reconversion Sinuses/Orbits: Trace mucosal thickening of paranasal sinuses and mastoids. The orbits are maintained. Other: None. IMPRESSION: 1. Large area of encephalomalacia and gliosis in the left frontotemporal parietal and insular region consistent with known prior left MCA territory infarct. Within that area, multiple small focal areas of restricted diffusion are seen, suggestive of acute on chronic  process. 2. Other areas of encephalomalacia and gliosis in the left frontal lobe as well as in the left caudate and right ACA/MCA watershed region. Electronically Signed   By: Baldemar Lenis M.D.   On: 04/18/2023 12:52   CT HEAD CODE STROKE WO CONTRAST` Result Date: 04/18/2023 CLINICAL DATA:  Code stroke.  Diplopia EXAM: CT HEAD WITHOUT CONTRAST TECHNIQUE: Contiguous axial images were obtained from the base of the skull through the vertex without intravenous contrast. RADIATION DOSE REDUCTION: This exam was performed according to the departmental dose-optimization program which includes automated exposure control, adjustment of the mA and/or kV according to patient size and/or use of iterative reconstruction technique. COMPARISON:  Head CT 03/03/2023 FINDINGS: Brain: Evolving left MCA territory infarct, not significantly changed from prior exam. Negative for an acute infarct. No mass effect. No mass lesion. No CT evidence of an acute cortical infarct Vascular: No hyperdense vessel or unexpected calcification. Skull: Normal. Negative for fracture or focal lesion. Sinuses/Orbits: No middle ear or mastoid effusion. Paranasal sinuses are clear. Orbits are unremarkable. Other: None. ASPECTS [facility] Stroke Program Early CT Score): 10 when accounting for chronic findings IMPRESSION: 1. No hemorrhage or CT evidence of an acute cortical infarct. 2. Evolving left MCA territory infarct, not significantly changed from prior exam. Findings were paged to Dr. Derry Lory on 04/18/23 at 11:43 AM. Electronically Signed   By: Lorenza Cambridge M.D.   On: 04/18/2023 11:43     Recent Labs    04/17/23 1204  WBC 3.9*  HGB 8.9*  HCT 26.1*  PLT 57*  No results for input(s): "NA", "K", "CL", "CO2", "GLUCOSE", "BUN", "CREATININE", "CALCIUM" in the last 72 hours.     Intake/Output Summary (Last 24 hours) at 04/19/2023 1150 Last data filed at 04/19/2023 1135 Gross per 24 hour  Intake 956 ml  Output 0 ml   Net 956 ml          Physical Exam: Vital Signs Blood pressure (!) 98/58, pulse 73, temperature 98.2 F (36.8 C), resp. rate 18, height 5\' 4"  (1.626 m), weight 75.5 kg, SpO2 100%, unknown if currently breastfeeding.   General: NAD, sleeping but arouseable to voice HEENT: Head is normocephalic, atraumatic, mucous membranes moist Neck: Supple without JVD or lymphadenopathy Heart: Reg rate and rhythm. No murmurs rubs or gallops Chest: CTA bilaterally without wheezes, rales, or rhonchi; no distress Abdomen: Positive bowel sounds, abdomen is soft, nonTTP, G tube c/d/I.   Extremities: No clubbing, cyanosis, or edema. Pulses are 2+ Psych: calm and cooperative Skin: Clean and intact without signs of breakdown over exposed surfaces, Gtube C/D/I Neuro: expressive aphasia, improving MsK: RUE/RLE weakness stable, moves LUE/LLE antigravity  PRIOR EXAMS:  Musculoskeletal:     Cervical back: Normal range of motion and neck supple.     Comments: LUE/LLE moving well again gravity RUE- no voluntary movement seen of RUE or RLE   Neurological:     Comments: Patient came back from working with therapy.  Awake and alert.  She does make eye contact with examiner.  Increased verbal output today. MAS of 3 in R shoulder esp External rotation; MAS of 4 in R elbow- lacking 45 degrees of extension with a lot of work; MAS of 2-3 of R hand- in fist at rest, but could completely open fingers and get past wrist neutral.  Difficult to assess sensation, stable 2/14    Assessment/Plan: 1. Functional deficits which require 3+ hours per day of interdisciplinary therapy in a comprehensive inpatient rehab setting. Physiatrist is providing close team supervision and 24 hour management of active medical problems listed below. Physiatrist and rehab team continue to assess barriers to discharge/monitor patient progress toward functional and medical goals  Care Tool:  Bathing    Body parts bathed by patient:  Right arm, Chest, Abdomen, Front perineal area, Buttocks, Right upper leg, Left upper leg, Left lower leg, Face   Body parts bathed by helper: Right lower leg, Left arm     Bathing assist Assist Level: Moderate Assistance - Patient 50 - 74%     Upper Body Dressing/Undressing Upper body dressing   What is the patient wearing?: Pull over shirt    Upper body assist Assist Level: Minimal Assistance - Patient > 75%    Lower Body Dressing/Undressing Lower body dressing      What is the patient wearing?: Pants     Lower body assist Assist for lower body dressing: Moderate Assistance - Patient 50 - 74%     Toileting Toileting    Toileting assist Assist for toileting: Moderate Assistance - Patient 50 - 74%     Transfers Chair/bed transfer  Transfers assist  Chair/bed transfer activity did not occur: Safety/medical concerns (unsafe to get up)  Chair/bed transfer assist level: Minimal Assistance - Patient > 75%     Locomotion Ambulation   Ambulation assist      Assist level: Minimal Assistance - Patient > 75% Assistive device: Walker-hemi Max distance: 175   Walk 10 feet activity   Assist     Assist level: Minimal Assistance - Patient > 75% Assistive  device: Walker-hemi   Walk 50 feet activity   Assist Walk 50 feet with 2 turns activity did not occur: Safety/medical concerns  Assist level: Minimal Assistance - Patient > 75% Assistive device: Walker-hemi    Walk 150 feet activity   Assist Walk 150 feet activity did not occur: Safety/medical concerns  Assist level: Minimal Assistance - Patient > 75% Assistive device: Walker-hemi    Walk 10 feet on uneven surface  activity   Assist Walk 10 feet on uneven surfaces activity did not occur: Safety/medical concerns         Wheelchair     Assist Is the patient using a wheelchair?: Yes Type of Wheelchair: Manual Wheelchair activity did not occur: Safety/medical concerns  Wheelchair assist  level: Supervision/Verbal cueing Max wheelchair distance: 182ft    Wheelchair 50 feet with 2 turns activity    Assist    Wheelchair 50 feet with 2 turns activity did not occur: Safety/medical concerns   Assist Level: Supervision/Verbal cueing   Wheelchair 150 feet activity     Assist  Wheelchair 150 feet activity did not occur: Safety/medical concerns   Assist Level: Supervision/Verbal cueing   Blood pressure (!) 98/58, pulse 73, temperature 98.2 F (36.8 C), resp. rate 18, height 5\' 4"  (1.626 m), weight 75.5 kg, SpO2 100%, unknown if currently breastfeeding.   Medical Problem List and Plan: 1. 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             -patient may  shower- cover PEG             -ELOS/Goals: 3-4 weeks mod to max A             -Continue CIR  Discussed prognosis for recovery -encouraged patient to maximize her participation with therapy, commended her on ambulating Chaplain consulted to change POA to older sister Mack Hook Will provide note regarding hospitalization for her court case regarding her eviction -discussed estimated discharge date with sister -discussed plan for caregiver training on Monday -discussed with team therapy refusals Stat head CT ordered 2/14 for new onset double vision -04/19/23 CT/MRI with basically stable L MCA CVA findings, nothing to explain diplopia, no further IP working up per neuro, f/up outpatient with stroke clinic  2.  Antiphospholipid antibody syndrome: discussed that Eliquis 5mg  BID will help to reduce risk of clotting/strokes, continue  Discussed that Eliquis will increase her menstrual bleeds, d/c megace given increased stroke risk with this  3. Pain Management: continue Neurontin 100 mg 3 times daily, topamax d/ced given hyperchloremia, oxycodone 5 mg every 4 hours as needed -03/30/23 some tingling in R arm, positional? Manageable with meds.  OT working on releasing scapula and helping with positioning; monitor -04/05/23 pain controlled, cont regimen  4. Anxiety: continue Klonopin 0.5 mg nightly, melatonin 5 mg nightly as needed -antipsychotic agents: suggest Depakote or increase in gabapentin to help calm pt down;  5. Neuropsych/cognition: This patient is capable of making decisions on her own behalf. 6. Skin/Wound Care: routine skin care for PEG and overall skin 7. Fluids/Electrolytes/Nutrition: will assess labs, monitor I&Os, cont supplements -03/29/23 K a little low 3.4, see #18; Na improved. BMP otherwise fairly unremarkable.  -03/30/23 K 4.0 today, BMP otherwise ok today, monitor -04/05/23 K 3.3, will give today, monitor labs Monday  8.  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  Seen  by oncology/hematology once in 2024-- needs to f/up  9.  Thrombocytopenia/iron deficiency/folic acid deficiency/B12 deficiency.  Follow-up CBC.  Continue Niferex 150mg  daily, and supplements  -04/05/23 Plt 90, stable, monitor   10.  Dysphagia.  Status post gastrostomy tube 03/21/2023 per interventional radiology.  Diet has been advanced to regular.  D/c TF. Discussed G-tube can be removed after 6-8 weeks from placement date-- discussed this again on 04/12/23, pt antsy to remove it. Discuss this again with her during d/c planning.   11.  E. coli UTI.  Antibiotic therapy completed 12.  Tobacco/alcohol use.  Counseling 13. Severe RUE spasticity- would strongly suggest Botox of RUE soon as well as resting hand splint for R wrist and R PRAFO- will order.   14. Abd pain- from PEG vs constipation- if no BM, might need KUB.  -IR consult to assess Gtube site.  Pus noted at skin site. Appears CT scan is ordered -Likely G-tube infection.  She is going for a IR procedure today.  Hospitalist team  following-appreciate assistance.  She has been started on vancomycin and ceftriaxone for abdominal infection.  Echocardiogram and cultures have been ordered.  Spoke with hospitalist today, ID has been consulted. -1/24 ID recommending vancomycin for 7 days total and can use linezolid if discharged prior to this -03/29/23 appreciate hospitalist help, repeat BCx neg so far, ID signed off; looks like abd xray ordered, pending. Advancing diet as above.  -03/30/23 no abd pain today, other than menstrual cramping, has tylenol if needed; abd xray yesterday fine. Hospitalist still following, no note yet today. Appreciate their assistance.  -04/05/23 still with abd cramping-- no significant tenderness; getting TVUS as below; monitor -04/06/23 no abd pain today, but LBM 04/03/23, cont miralax/colace BID but may need further intervention if no BM by tomorrow -04/12/23 LBM ?2/5 or today (per pt, just not documented); monitor for now, but if no BM by tomorrow, might want to further adjust meds. Refusing some meds intermittently... -04/13/23 LBM this morning according to pt AND boyfriend who's with her; none documented though; hard to know what to believe, but boyfriend corroborating that her LBM was today makes me think it's accurate... so monitor for now.  -04/19/23 LBM yesterday, monitor   15.  Elevated transaminases-mild, improved  Kpad ordered to minimize tylenol use  16.  Hyponatremia: resolved, monitor Na weekly. Improved 04/05/23  17.  History of ileus: d/c feeding supplements and continue regular diet-- see #14 regarding constipation  18. Hypokalemia: klor 40 ordered 1/27, monitor potassium weekly-- see #7  19. Right sided wrist drop: wrist cock-up splint ordered, continue prn  20. Right sided ankle instability: aircast ordered, discussed with PT that it has been received, continue prn  21. Anemia/menorrhagia: discussed stool occult is positive, repeat ordered for today, CBC for today has not yet been drawn,  chart reviewed and could be secondary to menses with started 1/31 -04/05/23 pt with dropping hgb the entire week, heavy menses all week, Hgb down to 6.9 overnight with associated hypotension/tachycardia, got 2U PRBCs. VS improving/stable. Spoke with mom and updated her. Pt understands, too.  -Spoke with OBGYN on call Dr. Nobie Putnam who requested we get a TVUS and start Megace 40mg  BID, continue Eliquis, they will f/up with TVUS once it's done, may need to increase Megace to 80mg  if heavy bleeding persists but for now start with 40mg  BID; monitor closely. F/up CBC after transfusion -04/06/23 post transfusion Hgb 8.5, recheck tomorrow morning; asked that RNs document regarding menstrual flow and clots, to  monitor how Megace is helping; must monitor closely; pelvic US done transabdominally only due to pt refusal, no abnormalities seen. Reach out to OBGYN if further assistance is needed Monitor CBC weekly -04/12/23 pt states her menses stopped, no documentation anywhere about this-- weekday team might want to touch base with OBGYN to see what the plan for her Megace is... Hgb reviewed and is stable at 8.9 -04/19/23 finally off megace, Hgb fairly stable, monitor for repeat menorrhagia-- would benefit from outpatient OBGYN f/up at d/c as this is not a new issue and will likely recur given the eliquis use.   22/ Hyperchloremia: may be 2/2 to dehydration from heavy menstrual bleeds, s/p IVF, d/c topamax, labs ordered but patient refused  23. Hypotension: s/p IVF with improvement, continue to monitor BP TID  -04/19/23 BP stable, monitor Vitals:   04/16/23 0539 04/16/23 1607 04/16/23 2104 04/17/23 0420  BP: (!) 93/53 101/60 (!) 102/57 (!) 87/52   04/17/23 0850 04/17/23 1623 04/17/23 2103 04/18/23 0537  BP: (!) 100/50 101/61 (!) 94/57 (!) 100/58   04/18/23 1104 04/18/23 1609 04/18/23 2022 04/19/23 0429  BP: 99/62 (!) 113/59 98/61 (!) 98/58       24. Rectal pain: with hazel pads ordered, continue prn  25.  Abdominal pain: KUB reviewed and shows moderate stool burden, colace increased to TID-- see #14  LOS: 25 days A FACE TO FACE EVALUATION WAS PERFORMED  [id number] Jordin Vicencio 04/19/2023, 11:50 AM

## 2023-04-20 NOTE — Progress Notes (Signed)
 PROGRESS NOTE   Subjective/Complaints:  Pt doing fine once again, slept well, denies pain, LBM 2 days ago, urinating fine per pt, denies any other complaints or concerns although history limited d/t aphasia.   ROS: Limited by aphasia, +right sided weakness, improved appetite, +menses with heavy flow--resolved, +right upper extremity pain-improved, +rectal pain-not reported today, abdominal pain- denied today, +new onset double vision- not reported   Objective:   MR BRAIN WO CONTRAST Result Date: 04/18/2023 CLINICAL DATA:  Neuro deficit, acute, stroke suspected. EXAM: MRI HEAD WITHOUT CONTRAST TECHNIQUE: Multiplanar, multiecho pulse sequences of the brain and surrounding structures were obtained without intravenous contrast. COMPARISON:  MRI of the brain February 27, 2023. FINDINGS: Brain: Large area of encephalomalacia and gliosis in the left frontotemporal parietal and insular region consistent with known prior left MCA territory infarct. Within that area, multiple small focal areas of restricted diffusion are seen, suggestive acute on chronic process. Other areas of encephalomalacia and gliosis are seen in the left frontal lobe as well as in the left caudate and right ACA/MCA watershed region. Hemosiderin deposit is seen in left temporal lobe. No hydrocephalus, extra-axial collection mass lesion. Vascular: Normal flow voids. Skull and upper cervical spine: Diffuse decrease of the T1 signal of the visualized osseous structures, may represent red marrow reconversion Sinuses/Orbits: Trace mucosal thickening of paranasal sinuses and mastoids. The orbits are maintained. Other: None. IMPRESSION: 1. Large area of encephalomalacia and gliosis in the left frontotemporal parietal and insular region consistent with known prior left MCA territory infarct. Within that area, multiple small focal areas of restricted diffusion are seen, suggestive of acute on  chronic process. 2. Other areas of encephalomalacia and gliosis in the left frontal lobe as well as in the left caudate and right ACA/MCA watershed region. Electronically Signed   By: Baldemar Lenis M.D.   On: 04/18/2023 12:52   CT HEAD CODE STROKE WO CONTRAST` Result Date: 04/18/2023 CLINICAL DATA:  Code stroke.  Diplopia EXAM: CT HEAD WITHOUT CONTRAST TECHNIQUE: Contiguous axial images were obtained from the base of the skull through the vertex without intravenous contrast. RADIATION DOSE REDUCTION: This exam was performed according to the departmental dose-optimization program which includes automated exposure control, adjustment of the mA and/or kV according to patient size and/or use of iterative reconstruction technique. COMPARISON:  Head CT 03/03/2023 FINDINGS: Brain: Evolving left MCA territory infarct, not significantly changed from prior exam. Negative for an acute infarct. No mass effect. No mass lesion. No CT evidence of an acute cortical infarct Vascular: No hyperdense vessel or unexpected calcification. Skull: Normal. Negative for fracture or focal lesion. Sinuses/Orbits: No middle ear or mastoid effusion. Paranasal sinuses are clear. Orbits are unremarkable. Other: None. ASPECTS [facility] Stroke Program Early CT Score): 10 when accounting for chronic findings IMPRESSION: 1. No hemorrhage or CT evidence of an acute cortical infarct. 2. Evolving left MCA territory infarct, not significantly changed from prior exam. Findings were paged to Dr. Derry Lory on 04/18/23 at 11:43 AM. Electronically Signed   By: Lorenza Cambridge M.D.   On: 04/18/2023 11:43     Recent Labs    04/17/23 1204  WBC 3.9*  HGB 8.9*  HCT 26.1*  PLT 57*     No results for input(s): "NA", "K", "CL", "CO2", "GLUCOSE", "BUN", "CREATININE", "CALCIUM" in the last 72 hours.     Intake/Output Summary (Last 24 hours) at 04/20/2023 1057 Last data filed at 04/19/2023 1135 Gross per 24 hour  Intake 0 ml  Output --   Net 0 ml          Physical Exam: Vital Signs Blood pressure (!) 95/53, pulse 86, temperature 98.2 F (36.8 C), resp. rate 17, height 5\' 4"  (1.626 m), weight 75.5 kg, SpO2 100%, unknown if currently breastfeeding.   General: NAD, sleeping but arouseable to voice HEENT: Head is normocephalic, atraumatic, mucous membranes moist Neck: Supple without JVD or lymphadenopathy Heart: Reg rate and rhythm. No murmurs rubs or gallops Chest: CTA bilaterally without wheezes, rales, or rhonchi; no distress Abdomen: Positive bowel sounds, abdomen is soft, nonTTP, G tube c/d/I.   Extremities: No clubbing, cyanosis, or edema. Pulses are 2+ Psych: calm and cooperative Skin: Clean and intact without signs of breakdown over exposed surfaces, Gtube C/D/I Neuro: expressive aphasia, improving MsK: RUE/RLE weakness stable, moves LUE/LLE antigravity  PRIOR EXAMS:  Musculoskeletal:     Cervical back: Normal range of motion and neck supple.     Comments: LUE/LLE moving well again gravity RUE- no voluntary movement seen of RUE or RLE   Neurological:     Comments: Patient came back from working with therapy.  Awake and alert.  She does make eye contact with examiner.  Increased verbal output today. MAS of 3 in R shoulder esp External rotation; MAS of 4 in R elbow- lacking 45 degrees of extension with a lot of work; MAS of 2-3 of R hand- in fist at rest, but could completely open fingers and get past wrist neutral.  Difficult to assess sensation, stable 2/14    Assessment/Plan: 1. Functional deficits which require 3+ hours per day of interdisciplinary therapy in a comprehensive inpatient rehab setting. Physiatrist is providing close team supervision and 24 hour management of active medical problems listed below. Physiatrist and rehab team continue to assess barriers to discharge/monitor patient progress toward functional and medical goals  Care Tool:  Bathing    Body parts bathed by patient: Right  arm, Chest, Abdomen, Front perineal area, Buttocks, Right upper leg, Left upper leg, Left lower leg, Face   Body parts bathed by helper: Right lower leg, Left arm     Bathing assist Assist Level: Moderate Assistance - Patient 50 - 74%     Upper Body Dressing/Undressing Upper body dressing   What is the patient wearing?: Pull over shirt    Upper body assist Assist Level: Minimal Assistance - Patient > 75%    Lower Body Dressing/Undressing Lower body dressing      What is the patient wearing?: Pants     Lower body assist Assist for lower body dressing: Moderate Assistance - Patient 50 - 74%     Toileting Toileting    Toileting assist Assist for toileting: Moderate Assistance - Patient 50 - 74%     Transfers Chair/bed transfer  Transfers assist  Chair/bed transfer activity did not occur: Safety/medical concerns (unsafe to get up)  Chair/bed transfer assist level: Minimal Assistance - Patient > 75%     Locomotion Ambulation   Ambulation assist      Assist level: Minimal Assistance - Patient > 75% Assistive device: Walker-hemi Max distance: 175   Walk 10 feet activity   Assist     Assist level: Minimal Assistance -  Patient > 75% Assistive device: Walker-hemi   Walk 50 feet activity   Assist Walk 50 feet with 2 turns activity did not occur: Safety/medical concerns  Assist level: Minimal Assistance - Patient > 75% Assistive device: Walker-hemi    Walk 150 feet activity   Assist Walk 150 feet activity did not occur: Safety/medical concerns  Assist level: Minimal Assistance - Patient > 75% Assistive device: Walker-hemi    Walk 10 feet on uneven surface  activity   Assist Walk 10 feet on uneven surfaces activity did not occur: Safety/medical concerns         Wheelchair     Assist Is the patient using a wheelchair?: Yes Type of Wheelchair: Manual Wheelchair activity did not occur: Safety/medical concerns  Wheelchair assist level:  Supervision/Verbal cueing Max wheelchair distance: 131ft    Wheelchair 50 feet with 2 turns activity    Assist    Wheelchair 50 feet with 2 turns activity did not occur: Safety/medical concerns   Assist Level: Supervision/Verbal cueing   Wheelchair 150 feet activity     Assist  Wheelchair 150 feet activity did not occur: Safety/medical concerns   Assist Level: Supervision/Verbal cueing   Blood pressure (!) 95/53, pulse 86, temperature 98.2 F (36.8 C), resp. rate 17, height 5\' 4"  (1.626 m), weight 75.5 kg, SpO2 100%, unknown if currently breastfeeding.   Medical Problem List and Plan: 1. 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             -patient may  shower- cover PEG             -ELOS/Goals: 3-4 weeks mod to max A             -Continue CIR  Discussed prognosis for recovery -encouraged patient to maximize her participation with therapy, commended her on ambulating Chaplain consulted to change POA to older sister Mack Hook Will provide note regarding hospitalization for her court case regarding her eviction -discussed estimated discharge date with sister -discussed plan for caregiver training on Monday -discussed with team therapy refusals Stat head CT ordered 2/14 for new onset double vision -04/19/23 CT/MRI with basically stable L MCA CVA findings, nothing to explain diplopia, no further IP working up per neuro, f/up outpatient with stroke clinic  2.  Antiphospholipid antibody syndrome: discussed that Eliquis 5mg  BID will help to reduce risk of clotting/strokes, continue  Discussed that Eliquis will increase her menstrual bleeds, d/c megace given increased stroke risk with this  3. Pain Management: continue Neurontin 100 mg 3 times daily, topamax d/ced given hyperchloremia, oxycodone 5 mg every 4 hours as needed -03/30/23 some tingling in R arm, positional? Manageable with meds. OT  working on releasing scapula and helping with positioning; monitor -04/05/23 pain controlled, cont regimen  4. Anxiety: continue Klonopin 0.5 mg nightly, melatonin 5 mg nightly as needed -antipsychotic agents: suggest Depakote or increase in gabapentin to help calm pt down;  5. Neuropsych/cognition: This patient is capable of making decisions on her own behalf. 6. Skin/Wound Care: routine skin care for PEG and overall skin 7. Fluids/Electrolytes/Nutrition: will assess labs, monitor I&Os, cont supplements -03/29/23 K a little low 3.4, see #18; Na improved. BMP otherwise fairly unremarkable.  -03/30/23 K 4.0 today, BMP otherwise ok today, monitor -04/05/23 K 3.3, will give today, monitor labs Monday  8.  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  Seen by oncology/hematology once in 2024-- needs to f/up  9.  Thrombocytopenia/iron deficiency/folic acid deficiency/B12 deficiency.  Follow-up CBC.  Continue Niferex 150mg  daily, and supplements  -04/05/23 Plt 90, stable, monitor   10.  Dysphagia.  Status post gastrostomy tube 03/21/2023 per interventional radiology.  Diet has been advanced to regular.  D/c TF. Discussed G-tube can be removed after 6-8 weeks from placement date-- discussed this again on 04/12/23, pt antsy to remove it. Discuss this again with her during d/c planning.   11.  E. coli UTI.  Antibiotic therapy completed 12.  Tobacco/alcohol use.  Counseling 13. Severe RUE spasticity- would strongly suggest Botox of RUE soon as well as resting hand splint for R wrist and R PRAFO- will order.   14. Abd pain- from PEG vs constipation- if no BM, might need KUB.  -IR consult to assess Gtube site.  Pus noted at skin site. Appears CT scan is ordered -Likely G-tube infection.  She is going for a IR procedure today.  Hospitalist team  following-appreciate assistance.  She has been started on vancomycin and ceftriaxone for abdominal infection.  Echocardiogram and cultures have been ordered.  Spoke with hospitalist today, ID has been consulted. -1/24 ID recommending vancomycin for 7 days total and can use linezolid if discharged prior to this -03/29/23 appreciate hospitalist help, repeat BCx neg so far, ID signed off; looks like abd xray ordered, pending. Advancing diet as above.  -03/30/23 no abd pain today, other than menstrual cramping, has tylenol if needed; abd xray yesterday fine. Hospitalist still following, no note yet today. Appreciate their assistance.  -04/05/23 still with abd cramping-- no significant tenderness; getting TVUS as below; monitor -04/06/23 no abd pain today, but LBM 04/03/23, cont miralax/colace BID but may need further intervention if no BM by tomorrow -04/12/23 LBM ?2/5 or today (per pt, just not documented); monitor for now, but if no BM by tomorrow, might want to further adjust meds. Refusing some meds intermittently... -04/13/23 LBM this morning according to pt AND boyfriend who's with her; none documented though; hard to know what to believe, but boyfriend corroborating that her LBM was today makes me think it's accurate... so monitor for now.  -04/20/23 LBM 2d ago, monitor   15.  Elevated transaminases-mild, improved  Kpad ordered to minimize tylenol use  16.  Hyponatremia: resolved, monitor Na weekly. Improved 04/05/23  17.  History of ileus: d/c feeding supplements and continue regular diet-- see #14 regarding constipation  18. Hypokalemia: klor 40 ordered 1/27, monitor potassium weekly-- see #7  19. Right sided wrist drop: wrist cock-up splint ordered, continue prn  20. Right sided ankle instability: aircast ordered, discussed with PT that it has been received, continue prn  21. Anemia/menorrhagia: discussed stool occult is positive, repeat ordered for today, CBC for today has not yet been drawn, chart  reviewed and could be secondary to menses with started 1/31 -04/05/23 pt with dropping hgb the entire week, heavy menses all week, Hgb down to 6.9 overnight with associated hypotension/tachycardia, got 2U PRBCs. VS improving/stable. Spoke with mom and updated her. Pt understands, too.  -Spoke with OBGYN on call Dr. Nobie Putnam who requested we get a TVUS and start Megace 40mg  BID, continue Eliquis, they will f/up with TVUS once it's done, may need to increase Megace to 80mg  if heavy bleeding persists but for now start with 40mg  BID; monitor closely. F/up CBC after transfusion -04/06/23 post transfusion Hgb 8.5, recheck tomorrow morning; asked that RNs document regarding  menstrual flow and clots, to monitor how Megace is helping; must monitor closely; pelvic US done transabdominally only due to pt refusal, no abnormalities seen. Reach out to OBGYN if further assistance is needed Monitor CBC weekly -04/12/23 pt states her menses stopped, no documentation anywhere about this-- weekday team might want to touch base with OBGYN to see what the plan for her Megace is... Hgb reviewed and is stable at 8.9 -04/19/23 finally off megace, Hgb fairly stable, monitor for repeat menorrhagia-- would benefit from outpatient OBGYN f/up at d/c as this is not a new issue and will likely recur given the eliquis use.   22/ Hyperchloremia: may be 2/2 to dehydration from heavy menstrual bleeds, s/p IVF, d/c topamax, labs ordered but patient refused  23. Hypotension: s/p IVF with improvement, continue to monitor BP TID  -2/15-16/25 BP stable, monitor Vitals:   04/17/23 0420 04/17/23 0850 04/17/23 1623 04/17/23 2103  BP: (!) 87/52 (!) 100/50 101/61 (!) 94/57   04/18/23 0537 04/18/23 1104 04/18/23 1609 04/18/23 2022  BP: (!) 100/58 99/62 (!) 113/59 98/61   04/19/23 0429 04/19/23 1434 04/19/23 1920 04/20/23 0429  BP: (!) 98/58 104/65 103/69 (!) 95/53       24. Rectal pain: with hazel pads ordered, continue prn  25. Abdominal pain:  KUB reviewed and shows moderate stool burden, colace increased to TID-- see #14  LOS: 26 days A FACE TO FACE EVALUATION WAS PERFORMED  [address] 04/20/2023, 10:57 AM

## 2023-04-21 ENCOUNTER — Other Ambulatory Visit (HOSPITAL_COMMUNITY): Payer: Self-pay

## 2023-04-21 LAB — BASIC METABOLIC PANEL
Anion gap: 9 (ref 5–15)
BUN: 11 mg/dL (ref 6–20)
CO2: 21 mmol/L — ABNORMAL LOW (ref 22–32)
Calcium: 8.6 mg/dL — ABNORMAL LOW (ref 8.9–10.3)
Chloride: 108 mmol/L (ref 98–111)
Creatinine, Ser: 0.83 mg/dL (ref 0.44–1.00)
GFR, Estimated: >60 mL/min (ref >60)
Glucose, Bld: 80 mg/dL (ref 70–99)
Potassium: 3.4 mmol/L — ABNORMAL LOW (ref 3.5–5.1)
Sodium: 138 mmol/L (ref 135–145)

## 2023-04-21 MED ORDER — DOCUSATE SODIUM 100 MG PO CAPS
100.0000 mg | ORAL_CAPSULE | Freq: Two times a day (BID) | ORAL | Status: DC
  Administered 2023-04-22: 100 mg via ORAL
  Filled 2023-04-21: qty 1

## 2023-04-21 MED ORDER — TRAMADOL HCL 50 MG PO TABS
50.0000 mg | ORAL_TABLET | Freq: Once | ORAL | Status: AC
  Administered 2023-04-21: 50 mg via ORAL
  Filled 2023-04-21: qty 1

## 2023-04-21 NOTE — Progress Notes (Signed)
 Physical Therapy Weekly Progress Note  Patient Details  Name: Jacqueline [name] MRN: [id number] Date of Birth: December 30, 2000  Beginning of progress report period: March 26, 2023 End of progress report period: April 21, 2023  Patient has met 2 of 3 short term goals. Pt demonstrates slow but steady progress towards long term goals. Pt is currently able to transfer supine<>sitting EOB with supervision and sit<>supine with min A for RLE management using hospital bed features (refuses to attempt without), sit<>stands and stand<>pivot transfers without AD and min A, ambulate up to 172ft with hemi walker and min A (with airsport aircast on RLE), and navigate 4 6in steps with 1 handrail and skilled min A. Pt continues to be limited by R hemiparesis, fluctuating motivation levels, pain mostly surrounding PEG tube site, and decreased standing balance/coordination. Pt's exact D/C plan is still uncertain, but appears that pt will be staying with sister. [name] and boyfriend have attended 1 family education training session and are scheduled to attend another session on 2/17. Pt will have loaner WC upon discharge.   Patient continues to demonstrate the following deficits muscle weakness, impaired timing and sequencing, abnormal tone, unbalanced muscle activation, decreased coordination, and decreased motor planning, decreased motor planning, decreased awareness, decreased problem solving, and decreased safety awareness, and decreased standing balance, decreased postural control, hemiplegia, and decreased balance strategies and therefore will continue to benefit from skilled PT intervention to increase functional independence with mobility.  Patient progressing toward long term goals..  Continue plan of care.  PT Short Term Goals Week 3:  PT Short Term Goal 1 (Week 3): pt will perform all aspects of bed mobility with min A consistantly PT Short Term Goal 1 - Progress (Week 3): Progressing toward goal PT  Short Term Goal 2 (Week 3): pt will transfer bed<>chair with LRAD and min A consistantly PT Short Term Goal 2 - Progress (Week 3): Met PT Short Term Goal 3 (Week 3): pt will ambulate 36ft with LRAD and skilled max A of 1 PT Short Term Goal 3 - Progress (Week 3): Met Week 4:  PT Short Term Goal 1 (Week 4): STG=LTG due to LOS  Skilled Therapeutic Interventions/Progress Updates:  Ambulation/gait training;Discharge planning;Functional mobility training;Psychosocial support;Therapeutic Activities;Balance/vestibular training;Disease management/prevention;Neuromuscular re-education;Skin care/wound management;Therapeutic Exercise;Wheelchair propulsion/positioning;Cognitive remediation/compensation;DME/adaptive equipment instruction;Pain management;Splinting/orthotics;UE/LE Strength taining/ROM;Community reintegration;Functional electrical stimulation;Patient/family education;Stair training;UE/LE Coordination activities   Therapy Documentation Precautions:  Precautions Precautions: Fall Precaution/Restrictions Comments: PEG, expressive aphasia with agitation, Rt hemi Restrictions Weight Bearing Restrictions Per Provider Order: (P) No  Therapy/Group: Individual Therapy Marlana Salvage Zaunegger Blima Rich PT, DPT 04/21/2023, 7:01 AM

## 2023-04-21 NOTE — Progress Notes (Signed)
 Speech Language Pathology Daily Session Note  Patient Details  Name: Jacqueline [name] MRN: [id number] Date of Birth: 2001/02/27  Today's Date: 04/21/2023 SLP Individual Time: 1115-1201; 101-130p SLP Individual Time Calculation (min): 46 min; 29 min  Short Term Goals: Week 4: SLP Short Term Goal 1 (Week 4): STGs=LTGs d/t elos  Skilled Therapeutic Interventions:   Session 1: Patient was seen in am to address expressive and receptive language. Pt was alert upon SLP arrival and greeted this clinician with a smile stating, "I didn't think you would be here." She was agreeable for session, concluded call with her boyfriend and turned her attention to SLP. Pt challenged in automatic speech, repetition of single words, answering yes/ no questions, and following single directions. Pt answered personal and biograpical yes/ no questions with 83% acc with mod A. She repeated family member names with max to total A. Subsequently with format changed to a responsive naming task utilziing family names, she verbalized her sisters with 75% acc.  Pt participated in automatic speech through recitation of months of the year. Pt warranting max A but ultimately able to approximate June and July. At conclusion of session, pt verbalized need for toileting. She followed single step directions for transfer and transition to bathroom with 80% acc and sup A. Pt with lots of spontaneous language this date at the phrase and sentence level. Direct handoff to NT at conclusion of session for assistance in the bathroom. SLP to continue POC  Session 2: Pt was seen in PM with her sister and cousin present for family education session. 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 SLP discussed pt's progress along with current goals. Additional information provided on high tech AAC app with review of purpose and layout. Sister was in agreement to download app. [name] also inquiring about how to  continue to assist pt in the home. SLP provided overview of HEP including focus on functional information, receptive language, cueing hierarchy, and frustration tolerance. All education was supplemented with handouts. Pt's cousin and sister were actively engaged throughout session as indicated by asking questions and providing additional insight. Pt left in bed with call button within reach, and family members present. SLP to continue POC.    Pain Pain Assessment Pain Scale: 0-10 Pain Score: 0-No pain Faces Pain Scale: No hurt Pain Type: Chronic pain Pain Location: Abdomen Pain Orientation: Left Pain Descriptors / Indicators: Discomfort Pain Frequency: Intermittent Pain Intervention(s): Medication (See eMAR)  Therapy/Group: Individual Therapy  Renaee Munda 04/21/2023, 4:01 PM

## 2023-04-21 NOTE — Progress Notes (Signed)
 Occupational Therapy Session Note  Patient Details  Name: Jacqueline [name] MRN: [id number] Date of Birth: [birth year]  Today's Date: 04/21/2023 OT Individual Time: 1005-1045 & 1335-1418 OT Individual Time Calculation (min): 40 min & 43 min   Short Term Goals: Week 4:  OT Short Term Goal 1 (Week 4): STG = LTG due to ELOS  Skilled Therapeutic Interventions/Progress Updates:  Session 1 Skilled OT intervention completed with focus on functional transfers, DC planning, RUE NMR. Pt received upright in bed, agreeable to session. No pain reported.  Pt was bright, smiling and engaged upon entry and during session this date. Remained expressively aphasic however greatly improved with her communication since admission. Discussed upcoming DC and plans for family ed this afternoon. Of note- pt's boyfriend, [name], on face time, reporting he does not plan to attend today. We discussed briefly that pt will be w/c level- non-ambulatory, and BSC used for toileting downstairs and sponge bathing.   Transitioned to EOB with supervision. RLE ankle brace already donned, dependent to donn both tennis shoes. Min A squat pivot > w/c. Pt perseverative on her hair which had been changed from braids to an afro. Transported dependently in w/c <> gym. OT created a make shift hair band via stockinet material to keep hair out of face. Scheduling notified of request for longer session tomorrow to address shower level BADLs.  Pt completed the following intervals while seated at the SCIFIT to promote activation of hemiplegic RUE, and endurance of LUE needed for independence with BADLs and functional transfers: -5 min, level 0 just to address ROM/activation with RUE, forward direction; min A needed at elbow during task due to flaccidity of RUE however trace activation palpated with shoulder protraction with task  Back in room, pt completed mod A stand pivot > EOB with cues for sequencing. Min A for bed mobility sit > semi upright.  Pt remained upright in bed, with bed alarm on/activated, and with all needs in reach at end of session.  Session 2 Skilled OT intervention completed with focus on family education with sister and cousin present. Pt received upright in bed, agreeable to session. No pain reported.  OT provided education on the following in prep for DC: -Recommended squat pivots with CGA/min A or stand pivots with hemiwalker with CGA/min A for all mobility needs, and up to min A for ADLs due to R hemiplegia and fall risk -Confirmed that sister has full flight of stairs to get to 2nd floor where all bedrooms/bathrooms are, therefore advised that pt will need to live on main floor on either a couch, twin mattress or hospital bed as pt requires skilled assist for stair navigation- of note, pt very displeased (though this has been discussed repeatedly) but family receptive to reasons why -Sisters bathroom is not w/c accessible, therefore even if they did carry pt upstairs, OT advised that pt would have to ambulate to regular toilet which therapy team is not recommending outside of therapies. Pt again not happy. -Reviewed that BSC needs to be used for toileting needs; showed them example on [employer] of hygiene liner for [facility] to assist with clean up -Reviewed that pt will need to sponge bathe until able to access shower upstairs -Of note- adapt delivered standard BSC. CSW notified to switch out for [facility] for squat pivot option to transfer. Discussed but did not practice squat pivot transfers -OPOT vs HHOT, however CSW notified that pt will receive HH services to accommodate nursing care for the PEG  OT assisted  pt in demonstrating the following during session: -Supervision bed mobility > EOB, donned shoes dependently with RLE ankle brace already in place -Multiple sit > stands with CGA with/without hemi walker -Multiple EOB <> BSC stand pivot transfers with/without hemi walker with cues needed for sequencing and RLE  positioning  Sister assisted with the following: -multiple sit > stands with CGA with hemi walker -CGA/min A stand pivot with hemi walker EOB <> BSC  Pt remained seated EOB, with bed alarm on/activated, and with all needs in reach at end of session.   Therapy Documentation Precautions:  Precautions Precautions: Fall Precaution/Restrictions Comments: PEG, expressive aphasia with agitation, Rt hemi Restrictions Weight Bearing Restrictions Per Provider Order: (P) No    Therapy/Group: Individual Therapy  Melvyn Novas, MS, OTR/L  04/21/2023, 2:55 PM

## 2023-04-21 NOTE — Plan of Care (Signed)
   Problem: Consults Goal: RH STROKE PATIENT EDUCATION Description: See Patient Education module for education specifics  Outcome: Progressing   Problem: RH BOWEL ELIMINATION Goal: RH STG MANAGE BOWEL WITH ASSISTANCE Description: STG Manage Bowel with medications with minimal Assistance. Outcome: Progressing   Problem: RH BLADDER ELIMINATION Goal: RH STG MANAGE BLADDER WITH ASSISTANCE Description: STG Manage Bladder with minimal Assistance Outcome: Progressing   Problem: RH SAFETY Goal: RH STG ADHERE TO SAFETY PRECAUTIONS W/ASSISTANCE/DEVICE Description: STG Adhere to Safety Precautions with min A Outcome: Progressing   Problem: RH PAIN MANAGEMENT Goal: RH STG PAIN MANAGED AT OR BELOW PT'S PAIN GOAL Description: <4w/ prns Outcome: Progressing   Problem: RH KNOWLEDGE DEFICIT Goal: RH STG INCREASE KNOWLEDGE OF STROKE PROPHYLAXIS Description: Manage stroke prophylaxis with min A Outcome: Progressing   Problem: RH BOWEL ELIMINATION Goal: RH STG MANAGE BOWEL W/MEDICATION W/ASSISTANCE Description: STG Manage Bowel with Medication with Assistance. Outcome: Progressing

## 2023-04-21 NOTE — Progress Notes (Signed)
 PROGRESS NOTE   Subjective/Complaints: No new complaints this morning Therapy notes reviewed and she is transferring MinA Plan for family training today  ROS: Limited by aphasia, +right sided weakness, improved appetite, +menses with heavy flow--resolved, +right upper extremity pain-improved, +rectal pain-not reported today, abdominal pain- denied today, +new onset double vision- not reported   Objective:   No results found.    No results for input(s): "WBC", "HGB", "HCT", "PLT" in the last 72 hours.    No results for input(s): "NA", "K", "CL", "CO2", "GLUCOSE", "BUN", "CREATININE", "CALCIUM" in the last 72 hours.     Intake/Output Summary (Last 24 hours) at 04/21/2023 1408 Last data filed at 04/21/2023 1252 Gross per 24 hour  Intake 360 ml  Output --  Net 360 ml          Physical Exam: Vital Signs Blood pressure 102/60, pulse 78, temperature 98.2 F (36.8 C), resp. rate 18, height 5\' 4"  (1.626 m), weight 75.5 kg, SpO2 100%, unknown if currently breastfeeding.   General: NAD, sleeping but arouseable to voice HEENT: Head is normocephalic, atraumatic, mucous membranes moist Neck: Supple without JVD or lymphadenopathy Heart: Reg rate and rhythm. No murmurs rubs or gallops Chest: CTA bilaterally without wheezes, rales, or rhonchi; no distress Abdomen: Positive bowel sounds, abdomen is soft, nonTTP, G tube c/d/I.   Extremities: No clubbing, cyanosis, or edema. Pulses are 2+ Psych: calm and cooperative Skin: Clean and intact without signs of breakdown over exposed surfaces, Gtube C/D/I Neuro: expressive aphasia, improving MsK: RUE/RLE weakness stable, moves LUE/LLE antigravity  PRIOR EXAMS:  Musculoskeletal:     Cervical back: Normal range of motion and neck supple.     Comments: LUE/LLE moving well again gravity RUE- no voluntary movement seen of RUE or RLE   Neurological:     Comments: Patient came back  from working with therapy.  Awake and alert.  She does make eye contact with examiner.  Increased verbal output today. MAS of 3 in R shoulder esp External rotation; MAS of 4 in R elbow- lacking 45 degrees of extension with a lot of work; MAS of 2-3 of R hand- in fist at rest, but could completely open fingers and get past wrist neutral.  Difficult to assess sensation, stable 2/17    Assessment/Plan: 1. Functional deficits which require 3+ hours per day of interdisciplinary therapy in a comprehensive inpatient rehab setting. Physiatrist is providing close team supervision and 24 hour management of active medical problems listed below. Physiatrist and rehab team continue to assess barriers to discharge/monitor patient progress toward functional and medical goals  Care Tool:  Bathing    Body parts bathed by patient: Right arm, Chest, Abdomen, Front perineal area, Buttocks, Right upper leg, Left upper leg, Left lower leg, Face   Body parts bathed by helper: Right lower leg, Left arm     Bathing assist Assist Level: Moderate Assistance - Patient 50 - 74%     Upper Body Dressing/Undressing Upper body dressing   What is the patient wearing?: Pull over shirt    Upper body assist Assist Level: Minimal Assistance - Patient > 75%    Lower Body Dressing/Undressing Lower body dressing  What is the patient wearing?: Pants     Lower body assist Assist for lower body dressing: Moderate Assistance - Patient 50 - 74%     Toileting Toileting    Toileting assist Assist for toileting: Moderate Assistance - Patient 50 - 74%     Transfers Chair/bed transfer  Transfers assist  Chair/bed transfer activity did not occur: Safety/medical concerns (unsafe to get up)  Chair/bed transfer assist level: Minimal Assistance - Patient > 75%     Locomotion Ambulation   Ambulation assist      Assist level: Minimal Assistance - Patient > 75% Assistive device: Walker-hemi Max distance:  170ft   Walk 10 feet activity   Assist     Assist level: Minimal Assistance - Patient > 75% Assistive device: Walker-hemi   Walk 50 feet activity   Assist Walk 50 feet with 2 turns activity did not occur: Safety/medical concerns  Assist level: Minimal Assistance - Patient > 75% Assistive device: Walker-hemi    Walk 150 feet activity   Assist Walk 150 feet activity did not occur: Safety/medical concerns  Assist level: Minimal Assistance - Patient > 75% Assistive device: Walker-hemi    Walk 10 feet on uneven surface  activity   Assist Walk 10 feet on uneven surfaces activity did not occur: Safety/medical concerns         Wheelchair     Assist Is the patient using a wheelchair?: Yes Type of Wheelchair: Manual Wheelchair activity did not occur: Safety/medical concerns  Wheelchair assist level: Supervision/Verbal cueing Max wheelchair distance: 11ft    Wheelchair 50 feet with 2 turns activity    Assist    Wheelchair 50 feet with 2 turns activity did not occur: Safety/medical concerns   Assist Level: Supervision/Verbal cueing   Wheelchair 150 feet activity     Assist  Wheelchair 150 feet activity did not occur: Safety/medical concerns   Assist Level: Supervision/Verbal cueing   Blood pressure 102/60, pulse 78, temperature 98.2 F (36.8 C), resp. rate 18, height 5\' 4"  (1.626 m), weight 75.5 kg, SpO2 100%, unknown if currently breastfeeding.   Medical Problem List and Plan: 1. 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             -patient may  shower- cover PEG             -ELOS/Goals: 3-4 weeks mod to max A             -Continue CIR  Discussed prognosis for recovery -encouraged patient to maximize her participation with therapy, commended her on ambulating Chaplain consulted to change POA to older sister Mack Hook Will provide note regarding hospitalization  for her court case regarding her eviction -discussed estimated discharge date with sister -discussed plan for caregiver training on Monday -discussed with team therapy refusals Stat head CT ordered 2/14 for new onset double vision -04/19/23 CT/MRI with basically stable L MCA CVA findings, nothing to explain diplopia, no further IP working up per neuro, f/up outpatient with stroke clinic  2.  Antiphospholipid antibody syndrome: discussed that Eliquis 5mg  BID will help to reduce risk of clotting/strokes, continue  Discussed that Eliquis will increase her menstrual bleeds, d/c megace given increased stroke risk with this  3. Pain Management: continue Neurontin 100 mg 3 times daily, topamax d/ced given hyperchloremia, oxycodone 5 mg every 4 hours as needed -03/30/23 some tingling in R arm, positional? Manageable with meds. OT working on releasing scapula and helping with positioning; monitor -  04/05/23 pain controlled, cont regimen  4. Anxiety: continue Klonopin 0.5 mg nightly, melatonin 5 mg nightly as needed -antipsychotic agents: suggest Depakote or increase in gabapentin to help calm pt down;  5. Neuropsych/cognition: This patient is capable of making decisions on her own behalf. 6. Skin/Wound Care: routine skin care for PEG and overall skin 7. Fluids/Electrolytes/Nutrition: will assess labs, monitor I&Os, cont supplements -03/29/23 K a little low 3.4, see #18; Na improved. BMP otherwise fairly unremarkable.  -03/30/23 K 4.0 today, BMP otherwise ok today, monitor -04/05/23 K 3.3, will give today, monitor labs Monday  8.  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  Seen by oncology/hematology once in 2024-- needs to f/up  9.  Thrombocytopenia/iron deficiency/folic acid deficiency/B12 deficiency.  Follow-up CBC.  Continue Niferex 150mg  daily,  and supplements  -04/05/23 Plt 90, stable, monitor   10.  Dysphagia.  Status post gastrostomy tube 03/21/2023 per interventional radiology.  Diet has been advanced to regular.  D/c TF. Discussed G-tube can be removed after 6-8 weeks from placement date-- discussed this again on 04/12/23, pt antsy to remove it. Discuss this again with her during d/c planning.   11.  E. coli UTI.  Antibiotic therapy completed 12.  Tobacco/alcohol use.  Counseling 13. Severe RUE spasticity- would strongly suggest Botox of RUE soon as well as resting hand splint for R wrist and R PRAFO- will order.   14. Abd pain- from PEG vs constipation- if no BM, might need KUB.  -IR consult to assess Gtube site.  Pus noted at skin site. Appears CT scan is ordered -Likely G-tube infection.  She is going for a IR procedure today.  Hospitalist team following-appreciate assistance.  She has been started on vancomycin and ceftriaxone for abdominal infection.  Echocardiogram and cultures have been ordered.  Spoke with hospitalist today, ID has been consulted. -1/24 ID recommending vancomycin for 7 days total and can use linezolid if discharged prior to this -03/29/23 appreciate hospitalist help, repeat BCx neg so far, ID signed off; looks like abd xray ordered, pending. Advancing diet as above.  -03/30/23 no abd pain today, other than menstrual cramping, has tylenol if needed; abd xray yesterday fine. Hospitalist still following, no note yet today. Appreciate their assistance.  -04/05/23 still with abd cramping-- no significant tenderness; getting TVUS as below; monitor -04/06/23 no abd pain today, but LBM 04/03/23, cont miralax/colace BID but may need further intervention if no BM by tomorrow -04/12/23 LBM ?2/5 or today (per pt, just not documented); monitor for now, but if no BM by tomorrow, might want to further adjust meds. Refusing some meds intermittently... -04/13/23 LBM this morning according to pt AND boyfriend who's with her; none documented  though; hard to know what to believe, but boyfriend corroborating that her LBM was today makes me think it's accurate... so monitor for now.  -04/20/23 LBM 2d ago, monitor   15.  Elevated transaminases-mild, improved  Kpad ordered to minimize tylenol use  16.  Hyponatremia: resolved, monitor Na weekly. Improved 04/05/23  17.  History of ileus: d/c feeding supplements and continue regular diet-- see #14 regarding constipation  18. Hypokalemia: klor 40 ordered 1/27, monitor potassium weekly-- see #7  19. Right sided wrist drop: wrist cock-up splint ordered, continue prn  20. Right sided ankle instability: aircast ordered, discussed with PT that it has been received, continue prn  21. Anemia/menorrhagia: discussed stool  occult is positive, repeat ordered for today, CBC for today has not yet been drawn, chart reviewed and could be secondary to menses with started 1/31 -04/05/23 pt with dropping hgb the entire week, heavy menses all week, Hgb down to 6.9 overnight with associated hypotension/tachycardia, got 2U PRBCs. VS improving/stable. Spoke with mom and updated her. Pt understands, too.  -Spoke with OBGYN on call Dr. Nobie Putnam who requested we get a TVUS and start Megace 40mg  BID, continue Eliquis, they will f/up with TVUS once it's done, may need to increase Megace to 80mg  if heavy bleeding persists but for now start with 40mg  BID; monitor closely. F/up CBC after transfusion -04/06/23 post transfusion Hgb 8.5, recheck tomorrow morning; asked that RNs document regarding menstrual flow and clots, to monitor how Megace is helping; must monitor closely; pelvic US done transabdominally only due to pt refusal, no abnormalities seen. Reach out to OBGYN if further assistance is needed Monitor CBC weekly -04/12/23 pt states her menses stopped, no documentation anywhere about this-- weekday team might want to touch base with OBGYN to see what the plan for her Megace is... Hgb reviewed and is stable at  8.9 -04/19/23 finally off megace, Hgb fairly stable, monitor for repeat menorrhagia-- would benefit from outpatient OBGYN f/up at d/c as this is not a new issue and will likely recur given the eliquis use.   22/ Hyperchloremia: labs reviewed and resolved  23. Hypotension: s/p IVF with improvement, continue to monitor Vitals:   04/17/23 2103 04/18/23 0537 04/18/23 1104 04/18/23 1609  BP: (!) 94/57 (!) 100/58 99/62 (!) 113/59   04/18/23 2022 04/19/23 0429 04/19/23 1434 04/19/23 1920  BP: 98/61 (!) 98/58 104/65 103/69   04/20/23 0429 04/20/23 1639 04/20/23 2032 04/21/23 0633  BP: (!) 95/53 108/63 97/69 102/60       24. Rectal pain: with hazel pads ordered, continue prn  25. Abdominal pain: KUB reviewed and shows moderate stool burden, decrease colace to BID  LOS: 27 days A FACE TO FACE EVALUATION WAS PERFORMED  Clint Bolder P Clarice Zulauf 04/21/2023, 2:08 PM

## 2023-04-21 NOTE — Progress Notes (Signed)
 Physical Therapy Session Note  Patient Details  Name: Jacqueline [name] MRN: [id number] Date of Birth: [birth year]  Today's Date: 04/21/2023 PT Individual Time: [phone] and [id number] PT Individual Time Calculation (min): 43 min and 41 min  Short Term Goals: Week 2:  PT Short Term Goal 1 (Week 2): pt will perform all aspects of bed mobility with mod A consistantly PT Short Term Goal 1 - Progress (Week 2): Met PT Short Term Goal 2 (Week 2): pt will transfer bed<>chair with mod A of 1 consistantly PT Short Term Goal 2 - Progress (Week 2): Met PT Short Term Goal 3 (Week 2): pt will ambulate 73ft with LRAD and skilled max A of 1 PT Short Term Goal 3 - Progress (Week 2): Progressing toward goal Week 3:  PT Short Term Goal 1 (Week 3): pt will perform all aspects of bed mobility with min A consistantly PT Short Term Goal 2 (Week 3): pt will transfer bed<>chair with LRAD and min A consistantly PT Short Term Goal 3 (Week 3): pt will ambulate 12ft with LRAD and skilled max A of 1  Skilled Therapeutic Interventions/Progress Updates:   Treatment Session 1 Received pt semi-reclined in bed, pt agreeable to PT treatment, and did not complain of pain during session.  Session with emphasis on functional mobility/transfers, dressing, generalized strengthening and endurance, dynamic standing balance/coordination, and gait training. Pt transferred semi-reclined<>sitting R EOB with HOB elevated and supervision. Donned pants, L airsport aircast, socks, and shoes sitting EOB with max A. Stood with min A and required assist to pull pants over hips, then transferred into WC via stand<>pivot without AD and min A. Doffed dirty shirt and donned clean one with min A and increased time. Pt transported to/from room in [facility] dependently for time management purposes. Stood with hemi walker and min A and ambulated 136ft with hemi walker and min A with swedish knee cage on. Returned to room and pt insisted on returning to bed -  transferred WC<>bed via stand<>pivot to L with min A and sit<>supine with supervision and increased time to get RLE onto bed. Concluded session with pt semi-reclined in bed, needs within reach, and bed alarm on.   Treatment Session 2 Received pt semi-reclined in bed with sister and cousin at bedside. Pt agreeable to PT treatment and denied any pain during session. Session with emphasis on discharge planning, functional mobility/transfers, generalized strengthening and endurance, dynamic standing balance/coordination, stair navigation, and gait training. Pt performed bed mobility with HOB elevated and supervision x 2 trials throughout session. Pt performed multiple stand<>pivot transfers without AD and min A provided by sister throughout session. Pt transported to/from room in [facility] dependently for time management purposes. Stood with hemi walker and CGA and ambulated 60ft with min A provided by therapist and 34ft with min A provided by sister. Pt then navigated 4 6in steps with R handrail and skilled min A (provided by therapist) with assist to guard R knee and advance/place RLE on/off step. Demonstrated technique for bumping pt up/down steps in [facility] but recommended pt stay on main level until working on stair navigation further in OPPT. Returned to room and sister assisted with transfer back to bed. Concluded session with pt semi-reclined in bed, needs within reach, and bed alarm on.   Discussed/educated family on the following: - recommendation to avoid stair navigation at home  - [facility] parts management and WC transfer set up - hand placement, body mechanics, and use of gait belt - remaining on pt's R  side with mobility - recommendation for hands on assist with ambulation in home only with caution when ambulating over carpet  Therapy Documentation Precautions:  Precautions Precautions: Fall Precaution/Restrictions Comments: PEG, expressive aphasia with agitation, Rt hemi Restrictions Weight Bearing  Restrictions Per Provider Order: (P) No  Therapy/Group: Individual Therapy Marlana Salvage Zaunegger Blima Rich PT, DPT 04/21/2023, 6:49 AM

## 2023-04-21 NOTE — Progress Notes (Signed)
 Patient ID: Jacqueline [name], female   DOB: 19-Dec-2000, 23 y.o.   MRN: [id number]  Met with pt, sister and cousin and OT to discuss home health will be coming out to do therapies along with a RN. Pt will do what she wants regarding going up the stirs in sister's home instead of staying on the first floor. PCS referral in place and has spoken with sister. Work toward [employer] discharge.

## 2023-04-22 ENCOUNTER — Other Ambulatory Visit (HOSPITAL_COMMUNITY): Payer: Self-pay

## 2023-04-22 MED ORDER — DOCUSATE SODIUM 100 MG PO CAPS
100.0000 mg | ORAL_CAPSULE | Freq: Three times a day (TID) | ORAL | Status: DC
  Administered 2023-04-22 – 2023-04-23 (×3): 100 mg via ORAL
  Filled 2023-04-22 (×3): qty 1

## 2023-04-22 MED ORDER — GABAPENTIN 100 MG PO CAPS
100.0000 mg | ORAL_CAPSULE | Freq: Three times a day (TID) | ORAL | 0 refills | Status: DC
  Filled 2023-04-22: qty 90, 30d supply, fill #0

## 2023-04-22 MED ORDER — ACETAMINOPHEN 325 MG PO TABS: 650.0000 mg | ORAL_TABLET | ORAL | Status: DC | PRN

## 2023-04-22 MED ORDER — DOCUSATE SODIUM 100 MG PO CAPS: 100.0000 mg | ORAL_CAPSULE | Freq: Two times a day (BID) | ORAL | Status: DC

## 2023-04-22 NOTE — Plan of Care (Signed)
  Problem: RH Balance Goal: LTG: Patient will maintain dynamic sitting balance (OT) Description: LTG:  Patient will maintain dynamic sitting balance with assistance during activities of daily living (OT) Outcome: Completed/Met Goal: LTG Patient will maintain dynamic standing with ADLs (OT) Description: LTG:  Patient will maintain dynamic standing balance with assist during activities of daily living (OT)  Outcome: Completed/Met   Problem: Sit to Stand Goal: LTG:  Patient will perform sit to stand in prep for activites of daily living with assistance level (OT) Description: LTG:  Patient will perform sit to stand in prep for activites of daily living with assistance level (OT) Outcome: Completed/Met   Problem: RH Eating Goal: LTG Patient will perform eating w/assist, cues/equip (OT) Description: LTG: Patient will perform eating with assist, with/without cues using equipment (OT) Outcome: Completed/Met   Problem: RH Grooming Goal: LTG Patient will perform grooming w/assist,cues/equip (OT) Description: LTG: Patient will perform grooming with assist, with/without cues using equipment (OT) Outcome: Completed/Met   Problem: RH Bathing Goal: LTG Patient will bathe all body parts with assist levels (OT) Description: LTG: Patient will bathe all body parts with assist levels (OT) Outcome: Completed/Met   Problem: RH Dressing Goal: LTG Patient will perform upper body dressing (OT) Description: LTG Patient will perform upper body dressing with assist, with/without cues (OT). Outcome: Completed/Met Goal: LTG Patient will perform lower body dressing w/assist (OT) Description: LTG: Patient will perform lower body dressing with assist, with/without cues in positioning using equipment (OT) Outcome: Completed/Met   Problem: RH Toileting Goal: LTG Patient will perform toileting task (3/3 steps) with assistance level (OT) Description: LTG: Patient will perform toileting task (3/3 steps) with  assistance level (OT)  Outcome: Completed/Met   Problem: RH Functional Use of Upper Extremity Goal: LTG Patient will use RT/LT upper extremity as a (OT) Description: LTG: Patient will use right/left upper extremity as a stabilizer/gross assist/diminished/nondominant/dominant level with assist, with/without cues during functional activity (OT) Outcome: Completed/Met   Problem: RH Toilet Transfers Goal: LTG Patient will perform toilet transfers w/assist (OT) Description: LTG: Patient will perform toilet transfers with assist, with/without cues using equipment (OT) Outcome: Completed/Met

## 2023-04-22 NOTE — Plan of Care (Signed)
  Problem: RH Tub/Shower Transfers Goal: LTG Patient will perform tub/shower transfers w/assist (OT) Description: LTG: Patient will perform tub/shower transfers with assist, with/without cues using equipment (OT) Outcome: Not Applicable Flowsheets (Taken 04/22/2023 0754) LTG: Pt will perform tub/shower stall transfers with assistance level of: (Goal discontinued as pt will only sponge bathe at DC due to accessibility limitations) --

## 2023-04-22 NOTE — Progress Notes (Signed)
 Orthopedic Tech Progress Note Patient Details:  [name] 10/15/2000 [id number]  HANGER PERSONNEL went to drop of a new RESTING WHO, and he saw 2 that he had already dropped off recently, so he took the one he was getting ready to drop off back.  Patient ID: Jacqueline [name], female   DOB: 08-Jun-2000, 22 y.o.   MRN: [id number]  Donald Pore 04/22/2023, 12:15 PM

## 2023-04-22 NOTE — Progress Notes (Signed)
 Orthopedic Tech Progress Note Patient Details:  Jacqueline [name] 11-Jun-2000 [id number]  Called in order for a RESTING WHO   Patient ID: [name], female   DOB: 06-18-2000, 23 y.o.   MRN: [id number]  Donald Pore 04/22/2023, 10:09 AM

## 2023-04-22 NOTE — Progress Notes (Signed)
 Physical Therapy Discharge Summary  Patient Details  Name: Jacqueline [name] MRN: [id number] Date of Birth: March 21, 2000  Date of Discharge from PT service:April 22, 2023  Today's Date: 04/22/2023 PT Individual Time: 4098-1191 PT Individual Time Calculation (min): 71 min   Patient has met 10 of 10 long term goals due to improved activity tolerance, improved balance, improved postural control, increased strength, increased range of motion, decreased pain, ability to compensate for deficits, improved attention, improved awareness, and improved coordination.  Patient to discharge at a wheelchair level  CGA/min A . Patient's care partner is independent to provide the necessary physical assistance at discharge. [name] (and additional family) attended family education training and verbalized and demonstrated confidence with tasks to ensure safe discharge at home. Have reinforced recommendation to avoid stair navigation at this time and for pt to remain on main level.   All goals met   Recommendation:  Patient will benefit from ongoing skilled PT services in outpatient setting to continue to advance safe functional mobility, address ongoing impairments in transfers, generalized strengthening and endurance, dynamic standing balance/coordination, gait training, NMR, stair navigation, and to minimize fall risk.  Equipment: Hemi walker, R airsport aircast, R swedish knee cage, K5 lightweight wheelchair   Reasons for discharge: treatment goals met and discharge from hospital  Patient/family agrees with progress made and goals achieved: Yes  Today's interventions: Received pt semi-reclined in bed, pt agreeable to PT treatment, and did not state pain level during session. Session with emphasis on discharge planning, functional mobility/transfers, generalized strengthening and endurance, dynamic standing balance/coordination, gait training, stair navigation, simulated car transfers, and WC mobility.  Pt transferred semi-reclined<>sitting R EOB with HOB elevated and use of bedrails with supervision. Donned R airsport aircast, swedish knee cage, and shoes with max A, then went through sensation, MMT, and pain interference questionnaire in preparation for discharge.   Pt transferred into WC via stand<>pivot with CGA and performed WC mobility 13ft using R hemi technique and supervision. Transported to dayroom and performed simulated car transfer with hemi walker and CGA, then ambulated 44ft on uneven surfaces (ramp) with hemi walker and heavy min A. Pt with 1 large LOB when turning at top of ramp, requiring assist for correction. Pt also required cues for sequencing with hemi walker on slope. Stood at staircase and navigated 12 6in steps with R handrail and skilled heavy min A using a step to pattern. Pt required assist to place RLE on top of step and to position properly when descending steps. Stood with hemi walker and CGA and ambulated 139ft with hemi walker and CGA back to room - increased time/effort required to advance RLE with mild/moderate hyperextension noted (but still improvement with knee cage donned). Removed shoes and transitioned into supine with supervision. Concluded session with pt semi-reclined in bed, needs within reach, and bed alarm on.   PT Discharge Precautions/Restrictions Precautions Precautions: Fall Precaution/Restrictions Comments: PEG, expressive aphasia, Rt hemi, poor frustration tolerance Restrictions Weight Bearing Restrictions Per Provider Order: No Pain Interference Pain Interference Pain Effect on Sleep: 4. Almost constantly Pain Interference with Therapy Activities: 1. Rarely or not at all Pain Interference with Day-to-Day Activities: 1. Rarely or not at all Cognition Overall Cognitive Status: Impaired/Different from baseline Arousal/Alertness: Awake/alert Orientation Level: Oriented X4 Memory: Impaired Awareness: Impaired Problem Solving:  Impaired Behaviors: Poor frustration tolerance Safety/Judgment: Impaired Sensation Sensation Light Touch: Impaired Detail Light Touch Impaired Details: Impaired RLE;Absent RLE Proprioception: Impaired Detail Proprioception Impaired Details: Impaired RLE;Absent RLE Additional Comments: pt with  significant'y decreased and some absent sensation along entire RLE - reports numbness. Coordination Gross Motor Movements are Fluid and Coordinated: No Fine Motor Movements are Fluid and Coordinated: No Coordination and Movement Description: pain surrounding PEG tube site, R hemiplegia, impaired motor planning/sequencing, and decreased balance/coordination. Finger Nose Finger Test: impaired on RUE Heel Shin Test: WFL on LLE, unable to perform on RLE Motor  Motor Motor: Hemiplegia;Abnormal tone Motor - Skilled Clinical Observations: pain surrounding PEG tube site, R hemiplegia, impaired motor planning/sequencing  Mobility Bed Mobility Bed Mobility: Rolling Right;Rolling Left;Supine to Sit;Sit to Supine Rolling Right: Supervision/verbal cueing Rolling Left: Supervision/Verbal cueing Supine to Sit: Supervision/Verbal cueing Sit to Supine: Supervision/Verbal cueing Transfers Transfers: Sit to Stand;Stand to Sit;Stand Pivot Transfers Sit to Stand: Contact Guard/Touching assist Stand to Sit: Supervision/Verbal cueing Stand Pivot Transfers: Contact Guard/Touching assist Transfer (Assistive device): None Locomotion  Gait Ambulation: Yes Gait Assistance: Contact Guard/Touching assist Gait Distance (Feet): 100 Feet Assistive device: Hemi-walker Gait Assistance Details: Verbal cues for technique;Verbal cues for sequencing;Verbal cues for gait pattern Gait Assistance Details: verbal cues to increase R step length, control at R knee to prevent hyperextension, and occasional cues for hemi walker sequencing/placement Gait Gait: Yes Gait Pattern: Impaired Gait Pattern: Step-to pattern;Decreased step  length - right;Decreased step length - left;Decreased stance time - right;Decreased stride length;Decreased hip/knee flexion - right;Decreased dorsiflexion - right;Decreased weight shift to right;Poor foot clearance - right;Poor foot clearance - left;Narrow base of support;Step-through pattern Gait velocity: decreased Stairs / Additional Locomotion Stairs: Yes Stairs Assistance: Minimal Assistance - Patient > 75% Stair Management Technique: One rail Right Number of Stairs: 12 Height of Stairs: 6 Ramp: Minimal Assistance - Patient >75% (hemi walker) Wheelchair Mobility Wheelchair Mobility: Yes Wheelchair Assistance: Supervision/Verbal cueing Wheelchair Propulsion: Left upper extremity;Left lower extremity Wheelchair Parts Management: Needs assistance Distance: 150ft  Trunk/Postural Assessment  Cervical Assessment Cervical Assessment: Within Functional Limits Thoracic Assessment Thoracic Assessment: Within Functional Limits Lumbar Assessment Lumbar Assessment: Exceptions to [facility] (posterior pelvic tilt) Postural Control Postural Control: Deficits on evaluation Righting Reactions: delayed and inadequate on R - improved since eval Protective Responses: delayed and inadequate on R - improved since eval  Balance Balance Balance Assessed: Yes Static Sitting Balance Static Sitting - Balance Support: Feet supported;No upper extremity supported Static Sitting - Level of Assistance: 6: Modified independent (Device/Increase time) Dynamic Sitting Balance Dynamic Sitting - Balance Support: Feet supported;No upper extremity supported Dynamic Sitting - Level of Assistance: 6: Modified independent (Device/Increase time) Static Standing Balance Static Standing - Balance Support: Left upper extremity supported (hemi walker) Static Standing - Level of Assistance: 5: Stand by assistance (CGA) Dynamic Standing Balance Dynamic Standing - Balance Support: Left upper extremity supported (hemi  walker) Dynamic Standing - Level of Assistance: 5: Stand by assistance (CGA) Dynamic Standing - Comments: with transfers Extremity Assessment  RLE Assessment RLE Assessment: Exceptions to [facility] General Strength Comments: tested sitting EOB RLE Strength Right Hip Flexion: 2+/5 Right Hip ABduction: 2/5 Right Hip ADduction: 2/5 Right Knee Flexion: 2-/5 Right Knee Extension: 3-/5 Right Ankle Dorsiflexion: 2/5 Right Ankle Plantar Flexion: 2+/5 LLE Assessment LLE Assessment: Exceptions to [facility] General Strength Comments: tesed sitting EOB LLE Strength Left Hip Flexion: 4-/5 Left Hip ABduction: 4/5 Left Hip ADduction: 4/5 Left Knee Flexion: 4-/5 Left Knee Extension: 4/5 Left Ankle Dorsiflexion: 4+/5 Left Ankle Plantar Flexion: 4+/5   Marlana Salvage Zaunegger Blima Rich PT, DPT 04/22/2023, 7:09 AM

## 2023-04-22 NOTE — Progress Notes (Signed)
 Speech Language Pathology Discharge Summary  Patient Details  Name: Jacqueline [name] MRN: [id number] Date of Birth: Dec 12, 2000  Date of Discharge from SLP service:April 22, 2023  Today's Date: 04/22/2023 SLP Individual Time: 0205-0303 SLP Individual Time Calculation (min): 58 min  Skilled Therapeutic Interventions:  Patient was seen in PM to address expressive and receptive language. Direct handoff from PT with pt requesting to get back in bed stating, "I'm tired, I'm sleepy." Pt assisted back to bed by this SLP through use of Stedy. She followed single step directions with 100% acc and min A. Pt answered biographical and personal yes/ no questions with 91% acc improving to 94% acc with sentence repetition. Pt challenged in repetition of family member names with pt requiring between min to total A. SLP further addressed automatic speech with pt needing total A and demo poor frustration tolerance during task. During previous family education session, pt's sister agreeable to download Weave chat AAC app to facilitate pt language. Utilizing app located on SLP's phone, pt identified categories of information with min A. Pt verbalizing headache at conclusion of session. Direct handoff to RN for medication administration at end of session.   Patient has met 5 of 5 long term goals.  Patient to discharge at overall Mod;Max level.  Reasons goals not met:     Clinical Impression/Discharge Summary: Pt has made excellent gains and has met 5 of 5 LTG's this admission due to improved use of multimodal communication and receptive and expressive language. She consumes a regular diet with mod I.  Pt is currently an overall mod to max A for language tasks. However, she demonstrates greater use of spontaneous language and non verbal communication. Pt/ family education complete and pt will discharge home with 24 hour supervision from family, friends, etc. Pt would benefit from home health f/u SLP services to maximize  language and cognition in order to maximize her functional independence.  Care Partner:  Caregiver Able to Provide Assistance: Yes  Type of Caregiver Assistance: Cognitive  Recommendation:  Home Health SLP;Outpatient SLP;24 hour supervision/assistance  Rationale for SLP Follow Up: Maximize functional communication;Maximize cognitive function and independence   Equipment:  none  Reasons for discharge: Discharged from hospital;Treatment goals met   Patient/Family Agrees with Progress Made and Goals Achieved: Yes    Renaee Munda 04/22/2023, 3:39 PM

## 2023-04-22 NOTE — Progress Notes (Incomplete)
 Inpatient Rehabilitation Care Coordinator Discharge Note   Patient Details  Name: Jacqueline [name] MRN: [id number] Date of Birth: [birth year]   Discharge location: HOME WITH SISTER-[name] AWARE WILL NEED 24/7 CARE  Length of Stay: 29 days  Discharge activity level: CGA-MIN LEVEL  Home/community participation: ACTIVE  Patient response AO:ZHYQMV Literacy - How often do you need to have someone help you when you read instructions, pamphlets, or other written material from your doctor or pharmacy?: Never  Patient response HQ:IONGEX Isolation - How often do you feel lonely or isolated from those around you?: Rarely  Services provided included: MD, RD, PT, OT, SLP, RN, CM, TR, Pharmacy, Neuropsych, SW  Financial Services:  Field seismologist Utilized: Private Insurance [employer] [employer]  Choices offered to/list presented to: PT AND SISTER  Follow-up services arranged:  Home Health, Patient/Family has no preference for HH/DME agencies, DME, Other (Comment) (PCS SERVICES) Home Health Agency: [facility]  PT  OT  SP  RN    DME : [facility] TUB BENCH, HEMI WALKER, DROP-ARM BEDSIDE COMMODE AND NU MOTION-WHEELCHAIR  PCS REFERRAL MADE AND SISTER HAS SPOKEN WITH THE PERSON TO GET SET UP FOR 3-4 HOURS OF ASSIST  LETTER GIVEN TO SISTER SHE REQUESTED REGARDING BEING THE PRIMARY CAREGIVER FOR SISTER Patient response to transportation need: Is the patient able to respond to transportation needs?: Yes In the past 12 months, has lack of transportation kept you from medical appointments or from getting medications?: No In the past 12 months, has lack of transportation kept you from meetings, work, or from getting things needed for daily living?: No   Patient/Family verbalized understanding of follow-up arrangements:  Yes  Individual responsible for coordination of the follow-up plan: KAHIJAH-SISTER [phone]  Confirmed correct DME delivered: Lucy Chris 04/22/2023     Comments (or additional information):SISTER AND COUSIN WERE HERE FOR FAMILY EDUCATION. OMAR-BOYFRIEND ALSO ATTENDED BUT DID NOT PARTICIPATE IN PT'S CARE. 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 SHE IS AT HIGH RISK FOR RE-ADMISSION DUE TO NON-COMPLIANCE AND RISK OF FALLING TRYING TO DO ON HER OWN WHEN SHE NEEDS ASSIST.  Summary of Stay    Date/Time Discharge Planning CSW  04/16/23 0847 Sister and pt's boyfriend were in for education-sister did some hands on. Will need to come back Sister wants pt to stay as long as possible to make progress. Aware of pt's lack of motivation and want to go home. Will need to set a DC date and have family come in for more family education. PCS referral faxed and obtained home health. Will need DME recommedations RGD  04/09/23 0904 Mom did not show up for education session-spoke with sister-Khadiajah who wants to be HCPOA and MD in agreement pt capable of making her own decisions. Trying to confirm plan of where she is going and who will be providing her care-ever changing process. Pt seems to be participating more in therapies RGD  04/02/23 0933 Mom to take her to her home and have sisters assist, will make PCS referral for additional assist. RGD  03/26/23 1008 New evaluation-per acute pt to go to Mom's home with sister, boyfriend and family members to assist. Will need 24/7 care at DC RGD       Quency Tober, Lemar Livings

## 2023-04-22 NOTE — Plan of Care (Signed)
  Problem: RH Swallowing Goal: LTG Patient will consume least restrictive diet using compensatory strategies with assistance (SLP) Description: LTG:  Patient will consume least restrictive diet using compensatory strategies with assistance (SLP) Outcome: Completed/Met Flowsheets (Taken 03/26/2023 1223) LTG: Pt Patient will consume least restrictive diet using compensatory strategies with assistance of (SLP): Modified Independent   Problem: RH Comprehension Communication Goal: LTG Patient will comprehend basic/complex auditory (SLP) Description: LTG: Patient will comprehend basic/complex auditory information with cues (SLP). Outcome: Completed/Met Flowsheets (Taken 04/22/2023 1534) LTG: Patient will comprehend: Basic auditory information LTG: Patient will comprehend auditory information with cueing (SLP): Moderate Assistance - Patient 50 - 74%   Problem: RH Expression Communication Goal: LTG Patient will express needs/wants via multi-modal(SLP) Description: LTG:  Patient will express needs/wants via multi-modal communication (gestures/written, etc) with cues (SLP) Outcome: Completed/Met Flowsheets (Taken 03/26/2023 1223) LTG: Patient will express needs/wants via multimodal communication (gestures/written, etc) with cueing (SLP): Moderate Assistance - Patient 50 - 74% Goal: LTG Patient will verbally express basic/complex needs(SLP) Description: LTG:  Patient will verbally express basic/complex needs, wants or ideas with cues  (SLP) Outcome: Completed/Met Flowsheets (Taken 03/26/2023 1223) LTG: Patient will verbally express basic/complex needs, wants or ideas (SLP): Maximal Assistance - Patient 25 - 49%   Problem: RH Awareness Goal: LTG: Patient will demonstrate awareness during functional activites type of (SLP) Description: LTG: Patient will demonstrate awareness during functional activites type of (SLP) Outcome: Completed/Met Flowsheets (Taken 04/22/2023 1534) Patient will demonstrate  during cognitive/linguistic activities awareness type of: Emergent LTG: Patient will demonstrate awareness during cognitive/linguistic activities with assistance of (SLP): Maximal Assistance - Patient 25 - 49%

## 2023-04-22 NOTE — Progress Notes (Signed)
 Occupational Therapy Session Note  Patient Details  Name: Jacqueline [name] MRN: [id number] Date of Birth: [birth year]  Today's Date: 04/22/2023 OT Individual Time: 4098-1191 OT Individual Time Calculation (min): 70 min    Short Term Goals: Week 4:  OT Short Term Goal 1 (Week 4): STG = LTG due to ELOS  Skilled Therapeutic Interventions/Progress Updates:  Skilled OT intervention completed with focus on ADL retraining, functional endurance, and mobility within a shower context. Pt received supine in bed, agreeable to session. RUE pain reported; pt requested to receive meds at end of session, therefore nurse notified. OT offered rest breaks, repositioning and moist heat via shower for pain relief.  Pt completed transfers dependently using stedy for time management. Min cues needed for RLE positioning and sequencing.   Pt needed + time to arouse, but then agreeable to shower. Transitioned to EOB with supervision with + time. Doffed wrist cock up splint with mod I. Stedy transfer > toilet. Continent of urinary void, with noted pad to have menstrual spotting present. Stedy transfer > TTB in shower.   Waterproof cover applied to RUE IV prior to shower. Pt was able to bathe all parts with min A for LUE only. CGA needed during anterior leaning for BLE access. Cues needed for thoroughness and all parts. OT did observe pt complete intentional RLE knee extension when trying to access R foot with bathing. Stedy transfer > sink. In perched position, pt required mod A to apply vaseline to skin. Min A for lifting RUE during application of deodorant.   Stedy transfer > EOB. Able to donn shirt with min A but max cues due to continued poor recall of hemi technique. Threaded underwear with pad, and pants with min A for lifting RLE but pt able to thread over BLE with cues again for hemi technique. Stood with CGA, then donned pants over hips with min A for managing pad to prevent entanglement. Donned socks dependently  for time.   Transitioned sit > supine with min A for RLE. OT changed soiled PEG dressing per nursing order. Pt verbalized complete sentence at end of session this date, when saying bye to OT in prep for DC: "I love you." Pt remained semi supine in bed, with bed alarm on/activated, and with all needs in reach at end of session.   Saebo Stim One  Applied for 60 mins (unattended) to the Rt deltoid for NMR and sublux management with the following parameters:  330 pulse width 35 Hz pulse rate On 8 sec/ off 8 sec Ramp up/ down 2 sec Symmetrical Biphasic wave form  Max intensity at 500 Ohm load  Removed from pt at end of cycle with no adverse skin reaction or irritation noted.   Therapy Documentation Precautions:  Precautions Precautions: Fall Precaution/Restrictions Comments: PEG, expressive aphasia, Rt hemi, poor frustration tolerance Restrictions Weight Bearing Restrictions Per Provider Order: No    Therapy/Group: Individual Therapy  Melvyn Novas, MS, OTR/L  04/22/2023, 9:54 AM

## 2023-04-22 NOTE — Progress Notes (Signed)
 PROGRESS NOTE   Subjective/Complaints: No new complaints this morning Tramadol has been helpful for pain New right resting hand splint ordered since previous was soiled  ROS: Limited by aphasia, +right sided weakness, improved appetite, +menses with heavy flow--resolved, +right upper extremity pain-improved, +rectal pain-not reported today, abdominal pain- denied today, +new onset double vision- not reported   Objective:   No results found.    No results for input(s): "WBC", "HGB", "HCT", "PLT" in the last 72 hours.    Recent Labs    04/21/23 1640  NA 138  K 3.4*  CL 108  CO2 21*  GLUCOSE 80  BUN 11  CREATININE 0.83  CALCIUM 8.6*       Intake/Output Summary (Last 24 hours) at 04/22/2023 1304 Last data filed at 04/21/2023 1759 Gross per 24 hour  Intake 240 ml  Output --  Net 240 ml          Physical Exam: Vital Signs Blood pressure 105/61, pulse 82, temperature 98.3 F (36.8 C), resp. rate 18, height 5\' 4"  (1.626 m), weight 75.5 kg, SpO2 100%, unknown if currently breastfeeding.   General: NAD, sleeping but arouseable to voice HEENT: Head is normocephalic, atraumatic, mucous membranes moist Neck: Supple without JVD or lymphadenopathy Heart: Reg rate and rhythm. No murmurs rubs or gallops Chest: CTA bilaterally without wheezes, rales, or rhonchi; no distress Abdomen: Positive bowel sounds, abdomen is soft, nonTTP, G tube c/d/I.   Extremities: No clubbing, cyanosis, or edema. Pulses are 2+ Psych: calm and cooperative Skin: Clean and intact without signs of breakdown over exposed surfaces, Gtube C/D/I   Musculoskeletal:     Cervical back: Normal range of motion and neck supple.     Comments: LUE/LLE moving well again gravity RUE- no voluntary movement seen of RUE or RLE   Neurological:     Comments: Patient came back from working with therapy.  Awake and alert.  She does make eye contact with  examiner.  Increased verbal output today. MAS of 3 in R shoulder esp External rotation; MAS of 4 in R elbow- lacking 45 degrees of extension with a lot of work; MAS of 2-3 of R hand- in fist at rest, but could completely open fingers and get past wrist neutral.  Difficult to assess sensation, stable 2/18    Assessment/Plan: 1. Functional deficits which require 3+ hours per day of interdisciplinary therapy in a comprehensive inpatient rehab setting. Physiatrist is providing close team supervision and 24 hour management of active medical problems listed below. Physiatrist and rehab team continue to assess barriers to discharge/monitor patient progress toward functional and medical goals  Care Tool:  Bathing    Body parts bathed by patient: Right arm, Chest, Abdomen, Front perineal area, Buttocks, Right upper leg, Left upper leg, Left lower leg, Face, Right lower leg   Body parts bathed by helper: Right lower leg, Left arm     Bathing assist Assist Level: Minimal Assistance - Patient > 75%     Upper Body Dressing/Undressing Upper body dressing   What is the patient wearing?: Pull over shirt    Upper body assist Assist Level: Minimal Assistance - Patient > 75%  Lower Body Dressing/Undressing Lower body dressing      What is the patient wearing?: Pants     Lower body assist Assist for lower body dressing: Minimal Assistance - Patient > 75%     Toileting Toileting    Toileting assist Assist for toileting: Minimal Assistance - Patient > 75%     Transfers Chair/bed transfer  Transfers assist  Chair/bed transfer activity did not occur: Safety/medical concerns (unsafe to get up)  Chair/bed transfer assist level: Contact Guard/Touching assist     Locomotion Ambulation   Ambulation assist      Assist level: Contact Guard/Touching assist Assistive device: Walker-hemi Max distance: 164ft   Walk 10 feet activity   Assist     Assist level: Contact  Guard/Touching assist Assistive device: Walker-hemi   Walk 50 feet activity   Assist Walk 50 feet with 2 turns activity did not occur: Safety/medical concerns  Assist level: Contact Guard/Touching assist Assistive device: Walker-hemi    Walk 150 feet activity   Assist Walk 150 feet activity did not occur: Safety/medical concerns  Assist level: Minimal Assistance - Patient > 75% Assistive device: Walker-hemi    Walk 10 feet on uneven surface  activity   Assist Walk 10 feet on uneven surfaces activity did not occur: Safety/medical concerns   Assist level: Minimal Assistance - Patient > 75% Assistive device: Education administrator Is the patient using a wheelchair?: Yes Type of Wheelchair: Manual Wheelchair activity did not occur: Safety/medical concerns  Wheelchair assist level: Supervision/Verbal cueing Max wheelchair distance: 160ft    Wheelchair 50 feet with 2 turns activity    Assist    Wheelchair 50 feet with 2 turns activity did not occur: Safety/medical concerns   Assist Level: Supervision/Verbal cueing   Wheelchair 150 feet activity     Assist  Wheelchair 150 feet activity did not occur: Safety/medical concerns   Assist Level: Supervision/Verbal cueing   Blood pressure 105/61, pulse 82, temperature 98.3 F (36.8 C), resp. rate 18, height 5\' 4"  (1.626 m), weight 75.5 kg, SpO2 100%, unknown if currently breastfeeding.   Medical Problem List and Plan: 1. 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             -patient may  shower- cover PEG             -ELOS/Goals: 3-4 weeks mod to max A             -Continue CIR  Discussed prognosis for recovery -encouraged patient to maximize her participation with therapy, commended her on ambulating Chaplain consulted to change POA to older sister Mack Hook Will provide note regarding hospitalization for her  court case regarding her eviction -discussed estimated discharge date with sister -discussed plan for caregiver training on Monday -discussed with team therapy refusals Stat head CT ordered 2/14 for new onset double vision -04/19/23 CT/MRI with basically stable L MCA CVA findings, nothing to explain diplopia, no further IP working up per neuro, f/up outpatient with stroke clinic  2.  Antiphospholipid antibody syndrome: discussed that Eliquis 5mg  BID will help to reduce risk of clotting/strokes, continue  Discussed that Eliquis will increase her menstrual bleeds, d/c megace given increased stroke risk with this  3. Pain Management: continue Neurontin 100 mg 3 times daily, topamax d/ced given hyperchloremia, oxycodone 5 mg every 4 hours as needed -03/30/23 some tingling in R arm, positional? Manageable with meds. OT working on releasing scapula and  helping with positioning; monitor -04/05/23 pain controlled, cont regimen  4. Anxiety: continue Klonopin 0.5 mg nightly, melatonin 5 mg nightly as needed -antipsychotic agents: suggest Depakote or increase in gabapentin to help calm pt down;  5. Neuropsych/cognition: This patient is capable of making decisions on her own behalf. 6. Skin/Wound Care: routine skin care for PEG and overall skin 7. Fluids/Electrolytes/Nutrition: will assess labs, monitor I&Os, cont supplements -03/29/23 K a little low 3.4, see #18; Na improved. BMP otherwise fairly unremarkable.  -03/30/23 K 4.0 today, BMP otherwise ok today, monitor -04/05/23 K 3.3, will give today, monitor labs Monday  8.  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  Seen by oncology/hematology once in 2024-- needs to f/up  9.  Thrombocytopenia/iron deficiency/folic acid deficiency/B12 deficiency.  Follow-up CBC.  Continue Niferex 150mg  daily, and  supplements  -04/05/23 Plt 90, stable, monitor   10.  Dysphagia.  Status post gastrostomy tube 03/21/2023 per interventional radiology.  Diet has been advanced to regular.  D/c TF. Discussed G-tube can be removed after 6-8 weeks from placement date-- discussed this again on 04/12/23, pt antsy to remove it. Discuss this again with her during d/c planning.   11.  E. coli UTI.  Antibiotic therapy completed 12.  Tobacco/alcohol use.  Counseling 13. Severe RUE spasticity- would strongly suggest Botox of RUE soon as well as resting hand splint for R wrist and R PRAFO- will order.   14. Abd pain- from PEG vs constipation- if no BM, might need KUB.  -IR consult to assess Gtube site.  Pus noted at skin site. Appears CT scan is ordered -Likely G-tube infection.  She is going for a IR procedure today.  Hospitalist team following-appreciate assistance.  She has been started on vancomycin and ceftriaxone for abdominal infection.  Echocardiogram and cultures have been ordered.  Spoke with hospitalist today, ID has been consulted. -1/24 ID recommending vancomycin for 7 days total and can use linezolid if discharged prior to this -03/29/23 appreciate hospitalist help, repeat BCx neg so far, ID signed off; looks like abd xray ordered, pending. Advancing diet as above.  -03/30/23 no abd pain today, other than menstrual cramping, has tylenol if needed; abd xray yesterday fine. Hospitalist still following, no note yet today. Appreciate their assistance.  -04/05/23 still with abd cramping-- no significant tenderness; getting TVUS as below; monitor -04/06/23 no abd pain today, but LBM 04/03/23, cont miralax/colace BID but may need further intervention if no BM by tomorrow -04/12/23 LBM ?2/5 or today (per pt, just not documented); monitor for now, but if no BM by tomorrow, might want to further adjust meds. Refusing some meds intermittently... -04/13/23 LBM this morning according to pt AND boyfriend who's with her; none documented  though; hard to know what to believe, but boyfriend corroborating that her LBM was today makes me think it's accurate... so monitor for now.  -04/20/23 LBM 2d ago, monitor   15.  Elevated transaminases-mild, improved  Kpad ordered to minimize tylenol use  16.  Hyponatremia: resolved, monitor Na weekly. Improved 04/05/23  17.  History of ileus: d/c feeding supplements and continue regular diet-- see #14 regarding constipation  18. Hypokalemia: klor 40 ordered 1/27, monitor potassium weekly-- see #7  19. Right sided wrist drop: wrist cock-up splint ordered, continue prn  20. Right sided ankle instability: aircast ordered, discussed with PT that it has been received, continue prn  21. Anemia/menorrhagia: Hgb reviewed and is stable at 8.9 Would benefit from outpatient OBGYN f/up at d/c as this is not a new issue and will likely recur given the eliquis use.   22/ Hyperchloremia: labs reviewed and resolved  23. Hypotension: s/p IVF with improvement, continue to monitor Vitals:   04/18/23 1609 04/18/23 2022 04/19/23 0429 04/19/23 1434  BP: (!) 113/59 98/61 (!) 98/58 104/65   04/19/23 1920 04/20/23 0429 04/20/23 1639 04/20/23 2032  BP: 103/69 (!) 95/53 108/63 97/69   04/21/23 0633 04/21/23 1510 04/21/23 1915 04/22/23 0423  BP: 102/60 124/70 110/62 105/61       24. Rectal pain: with hazel pads ordered, continue prn  25. Abdominal pain: KUB reviewed and shows moderate stool burden, increase colace to TID  LOS: 28 days A FACE TO FACE EVALUATION WAS PERFORMED  Saide Lanuza P [employer] 04/22/2023, 1:04 PM

## 2023-04-22 NOTE — Progress Notes (Signed)
 Occupational Therapy Discharge Summary  Patient Details  Name: Jacqueline [name] MRN: [id number] Date of Birth: September 20, 2000  Date of Discharge from OT service:April 22, 2023   Patient has met 11 of 11 long term goals due to improved activity tolerance, improved balance, ability to compensate for deficits, functional use of  RIGHT lower extremity, and improved coordination.  Patient to discharge at [facility] Assist level.  Patient's care partner is independent to provide the necessary physical and cognitive assistance at discharge. [name] and additional family members/caregivers completed hands on training/education prior to DC regarding functional transfer and ADL recommendations and have verbalized their readiness to assist pt at their CLOF.  All goals met  Recommendation:  Patient will benefit from ongoing skilled OT services in home health setting to continue to advance functional skills in the area of BADL, Reduce care partner burden, and restore functional use of RUE .  Equipment: RUE resting hand splint, RUE wrist cock up splint, drop arm BSC  Reasons for discharge: treatment goals met  Patient/family agrees with progress made and goals achieved: Yes  OT Discharge Precautions/Restrictions  Precautions Precautions: Fall Precaution/Restrictions Comments: PEG, expressive aphasia, Rt hemi, poor frustration tolerance Restrictions Weight Bearing Restrictions Per Provider Order: No ADL ADL Equipment Provided: Long-handled sponge Eating: Set up Where Assessed-Eating: Wheelchair Grooming: Setup Where Assessed-Grooming: Sitting at sink Upper Body Bathing: Minimal assistance Where Assessed-Upper Body Bathing: Shower Lower Body Bathing: Contact guard Where Assessed-Lower Body Bathing: Shower Upper Body Dressing: Minimal assistance Where Assessed-Upper Body Dressing: Edge of bed Lower Body Dressing: Minimal assistance Where Assessed-Lower Body Dressing: Sitting at sink,  Standing at sink Toileting: Minimal assistance Where Assessed-Toileting: IT consultant Method: Surveyor, minerals: Other (comment) Psychologist, educational) Tub/Shower Transfer: Unable to assess Tub/Shower Transfer Method: Unable to assess Film/video editor: Unable to assess Film/video editor Method: Unable to assess Vision Baseline Vision/History: 0 No visual deficits Patient Visual Report: No change from baseline Vision Assessment?: No apparent visual deficits Perception  Perception: Impaired Perception-Other Comments: R inattention Praxis Praxis: Impaired Praxis Impairment Details: Motor planning;Perseveration Cognition Cognition Overall Cognitive Status: Impaired/Different from baseline Arousal/Alertness: Awake/alert Memory: Impaired Awareness: Impaired Problem Solving: Impaired Behaviors: Poor frustration tolerance Safety/Judgment: Impaired Sensation Sensation Light Touch: Impaired Detail Light Touch Impaired Details: Impaired RLE;Absent RLE Proprioception: Impaired Detail Proprioception Impaired Details: Impaired RLE;Absent RLE Additional Comments: pt with significant'y decreased and some absent sensation along entire RLE - reports numbness. Coordination Gross Motor Movements are Fluid and Coordinated: No Fine Motor Movements are Fluid and Coordinated: No Coordination and Movement Description: pain surrounding PEG tube site, R hemiplegia, impaired motor planning/sequencing, and decreased balance/coordination. Finger Nose Finger Test: impaired on RUE Heel Shin Test: WFL on LLE, unable to perform on RLE Motor  Motor Motor: Hemiplegia;Abnormal tone Motor - Skilled Clinical Observations: pain surrounding PEG tube site, R hemiplegia, impaired motor planning/sequencing Mobility  Bed Mobility Bed Mobility: Rolling Right;Rolling Left;Supine to Sit;Sit to Supine Rolling Right: Supervision/verbal cueing Rolling  Left: Supervision/Verbal cueing Supine to Sit: Supervision/Verbal cueing Sit to Supine: Supervision/Verbal cueing Transfers Sit to Stand: Contact Guard/Touching assist Stand to Sit: Supervision/Verbal cueing  Trunk/Postural Assessment  Cervical Assessment Cervical Assessment: Within Functional Limits Thoracic Assessment Thoracic Assessment: Within Functional Limits Lumbar Assessment Lumbar Assessment: Exceptions to [facility] (posterior pelvic tilt) Postural Control Postural Control: Deficits on evaluation Righting Reactions: delayed and inadequate on R - improved since eval Protective Responses: delayed and inadequate on R - improved since eval  Balance Balance Balance  Assessed: Yes Static Sitting Balance Static Sitting - Balance Support: Feet supported;No upper extremity supported Static Sitting - Level of Assistance: 6: Modified independent (Device/Increase time) Dynamic Sitting Balance Dynamic Sitting - Balance Support: Feet supported;No upper extremity supported Dynamic Sitting - Level of Assistance: 6: Modified independent (Device/Increase time) Static Standing Balance Static Standing - Balance Support: Left upper extremity supported (hemi walker) Static Standing - Level of Assistance: 5: Stand by assistance (CGA) Dynamic Standing Balance Dynamic Standing - Balance Support: Left upper extremity supported (hemi walker) Dynamic Standing - Level of Assistance: 5: Stand by assistance (CGA) Dynamic Standing - Comments: with transfers Extremity/Trunk Assessment RUE Assessment RUE Assessment: Exceptions to [facility] RUE Body System: Neuro Brunstrum levels for arm and hand: Arm;Hand Brunstrum level for arm: Stage II Synergy is developing Brunstrum level for hand: Stage I Flaccidity RUE Tone RUE Tone: Mild LUE Assessment LUE Assessment: Within Functional Limits   Jacqueline [name] E Jacqueline Criger, MS, OTR/L  04/22/2023, 12:19 PM

## 2023-04-22 NOTE — Progress Notes (Signed)
 Physical Therapy Session Note  Patient Details  Name: Jacqueline [name] MRN: [id number] Date of Birth: Nov 22, 2000  Today's Date: 04/22/2023 PT Individual Time: 1330-1400 PT Individual Time Calculation (min): 30 min   Short Term Goals: Week 3:  PT Short Term Goal 1 (Week 3): pt will perform all aspects of bed mobility with min A consistantly PT Short Term Goal 1 - Progress (Week 3): Progressing toward goal PT Short Term Goal 2 (Week 3): pt will transfer bed<>chair with LRAD and min A consistantly PT Short Term Goal 2 - Progress (Week 3): Met PT Short Term Goal 3 (Week 3): pt will ambulate 91ft with LRAD and skilled max A of 1 PT Short Term Goal 3 - Progress (Week 3): Met Week 4:  PT Short Term Goal 1 (Week 4): STG=LTG due to LOS   Skilled Therapeutic Interventions/Progress Updates:   Extra time to wake patient up. Pt then reporting needing to go to the bathroom urgently and rushing therapist - educated on importance of safety, donning of shoes, etc prior to transfer. Supervision to CGA to come to EOB due to impulsivity in the moment. Min assist for stand pivots to w/c and then on and off toilet - cues for safety, positioning, and facilitation for weightshifting. Dynamic standing balance with min assist for clothing management and hygiene in standing. Checked height on HW that was delivered in patients room as she reports she is unsure if it was already checked.  Five times Sit to Stand Test (FTSS) Method: Use a straight back chair with a solid seat that is 16-18" high. Ask participant to sit on the chair with arms folded across their chest.   Instructions: "Stand up and sit down as quickly as possible 5 times, keeping your arms folded across your chest."   Measurement: Stop timing when the participant stands the 5th time.  TIME: __43____ (in seconds)  Times > 13.6 seconds is associated with increased disability and morbidity (Guralnik, 2000) Times > 15 seconds is predictive of recurrent  falls in healthy individuals aged 75 and older (Buatois, et al., 2008) Normal performance values in community dwelling individuals aged 18 and older (Bohannon, 2006): 60-69 years: 11.4 seconds 70-79 years: 12.6 seconds 80-89 years: 14.8 seconds  MCID: >= 2.3 seconds for Vestibular Disorders (Meretta, 2006)  Handoff to SLP for next session.    Therapy Documentation Precautions:  Precautions Precautions: Fall Precaution/Restrictions Comments: PEG, expressive aphasia, Rt hemi, poor frustration tolerance Restrictions Weight Bearing Restrictions Per Provider Order: No  Pain:  No complaints of pain.   Therapy/Group: Individual Therapy  Karolee Stamps Darrol Poke, PT, DPT, CBIS  04/22/2023, 2:07 PM

## 2023-04-23 ENCOUNTER — Other Ambulatory Visit (HOSPITAL_COMMUNITY): Payer: Self-pay

## 2023-04-23 MED ORDER — DOCUSATE SODIUM 100 MG PO CAPS: 100.0000 mg | ORAL_CAPSULE | Freq: Three times a day (TID) | ORAL | Status: DC

## 2023-04-23 NOTE — Progress Notes (Signed)
 Education provided to patient and sister on g-tube maintenance. Discharge instructions provided by D.Angiulli, PA. Medications picked up at [facility]. Staff assisted patient off the unit; patient discharged safely via private car with sister.    Tilden Dome, LPN

## 2023-04-23 NOTE — Progress Notes (Signed)
 Inpatient Rehabilitation Discharge Medication Review by a Pharmacist  A complete drug regimen review was completed for this patient to identify any potential clinically significant medication issues.   High Risk Drug Classes Is patient taking? Indication by Medication  Antipsychotic No    Anticoagulant Yes Apixaban - hypercoaguability from lupus anticoagulant; CVA prophylaxis, hx PE  Antibiotic No   Opioid Yes Oxycodone -  pain  Antiplatelet No    Hypoglycemics/insulin No    Vasoactive Medication No    Chemotherapy No    Other Yes Clonazepam - mood stabilization Gabapentin - mood stabilization, pain Vitamin B-12, MVI, folate, iron polysaccharides - supplements Docusate, Miralax - laxative Acetaminophen - mild pain, temp >99.5 F         Type of Medication Issue Identified Description of Issue Recommendation(s)  Drug Interaction(s) (clinically significant)        Duplicate Therapy        Allergy        No Medication Administration End Date        Incorrect Dose        Additional Drug Therapy Needed        Significant med changes from prior encounter (inform family/care partners about these prior to discharge).    Other            Clinically significant medication issues were identified that warrant physician communication and completion of prescribed/recommended actions by midnight of the next day:  No   Pharmacist comments:    Time spent performing this drug regimen review (minutes):  15    Thank you for allowing pharmacy to be part of this patients care team.  Noah Delaine, RPh Clinical Pharmacist 04/23/2023 8:32 AM

## 2023-04-25 ENCOUNTER — Telehealth: Payer: Self-pay

## 2023-04-25 NOTE — Telephone Encounter (Signed)
 Transitional Care Call--who you spoke with Deveron Furlong (Sister) [phone].   Are you/is patient experiencing any problems since coming home? No.  Are there any questions regarding any aspect of care? Are there any questions regarding medications administration/dosing?No questions.  Are meds being taken as prescribed? Yes. Patient should review meds with caller to confirm. Confirmed.  Have there been any falls? None Has Home Health been to the house and/or have they contacted you?Yes.   If not, have you tried to contact them? No need. Can we help you contact them?  No need. Are bowels and bladder emptying properly? Yes.  Are there any unexpected incontinence issues? No.   If applicable, is patient following bowel/bladder programs? Yes. Any fevers, problems with breathing, unexpected pain? None. Are there any skin problems or new areas of breakdown? None. Has the patient/family member arranged specialty MD follow up (ie cardiology/neurology/renal/surgical/etc)?  The sister & patient are aware of the follow up appointments. Also the ones that need to be made. Can we help arrange? Follow up appointment information review from the discharge summary. Does the patient need any other services or support that we can help arrange? No. ([facility] phone number given access to NEW  PCP [phone]. Are caregivers following through as expected in assisting the patient? Yes.        11. Has the patient quit smoking, drinking alcohol, or using drugs as recommended? Advised.    Appointment  [address], [place] on 05/06/2023 arrive at 9:00 am.

## 2023-04-29 ENCOUNTER — Telehealth: Payer: Self-pay

## 2023-04-29 NOTE — Telephone Encounter (Signed)
 Vinnie Langton, PT from [facility] called stating that she went to evaluate patient on Saturday and discovered that patient is being left alone for a great portion on the day and really needs around the clock care. Vinnie Langton did not start case due to it being unsafe environment.

## 2023-04-30 ENCOUNTER — Ambulatory Visit: Payer: Self-pay | Admitting: Pediatrics

## 2023-04-30 NOTE — Telephone Encounter (Signed)
 Chief Complaint: Vaginal bleeding Frequency: Ongoing Pertinent Negatives: Patient denies weakness, lightheaded Disposition: [] ED /[] Urgent Care (no appt availability in office) / [x] Appointment(In office/virtual)/ []  [facility]/ [] Home Care/ [] Refused Recommended Disposition /[] [facility]/ []  Follow-up with PCP Additional Notes: Spoke with pt sister, Trenton Founds. Pt was in hospital after a stroke from Dec 2024 until last Wednesday. Pt sister states she noticed mild vaginal bleeding from pt last Thursday. Pt sister is not sure if this was happening while in hospital. Pt does not have an established PCP so an appointment to establish care was scheduled for 5/9. This RN told pt sister that pt needs to be seen within 24 hours since pt is taking Eliquis. Pt scheduled for urgent care appointment tomorrow at 11 AM at [facility] at [facility]. This RN educated pt sister on home care, new-worsening symptoms, when to call back/seek emergent care. Pt sister verbalized understanding and agrees to plan.   Copied from CRM [id number]. Topic: Clinical - Red Word Triage >> Apr 30, 2023 11:21 AM Hector Shade B wrote: [facility] that prompted transfer to Nurse Triage: Patient's sister Zohal Reny [phone], patient is a stroke patient sister has stated she doesn't have a primary doctor, she has just been discharged after several months in a rehabilitation facility. Sister states she is bleeding extremely heavy and needs to have her seen asap Reason for Disposition  Taking Coumadin (warfarin) or other strong blood thinner, or known bleeding disorder (e.g., thrombocytopenia)  Answer Assessment - Initial Assessment Questions 1. AMOUNT: "Describe the bleeding that you are having."    - SPOTTING: spotting, or pinkish / brownish mucous discharge; does not fill panty liner or pad    - MILD:  less than 1 pad / hour; less than patient's usual menstrual bleeding   - MODERATE: 1-2 pads / hour;  1 menstrual cup every 6 hours; small-medium blood clots (e.g., pea, grape, small coin)   - SEVERE: soaking 2 or more pads/hour for 2 or more hours; 1 menstrual cup every 2 hours; bleeding not contained by pads or continuous red blood from vagina; large blood clots (e.g., golf ball, large coin)      Mild 2. ONSET: "When did the bleeding begin?" "Is it continuing now?"     Last Thursday, pt got home from hospital on Wednesday; pt sister not sure if she was bleeding in hospital 3. MENSTRUAL PERIOD: "When was the last normal menstrual period?" "How is this different than your period?"     Not sure; pt was in hospital since Dec 20 due to stroke 4. REGULARITY: "How regular are your periods?"     Regular prior to stroke 5. ABDOMEN PAIN: "Do you have any pain?" "How bad is the pain?"  (e.g., Scale 1-10; mild, moderate, or severe)   - MILD (1-3): doesn't interfere with normal activities, abdomen soft and not tender to touch    - MODERATE (4-7): interferes with normal activities or awakens from sleep, abdomen tender to touch    - SEVERE (8-10): excruciating pain, doubled over, unable to do any normal activities      Pt has a tube in her stomach; pt is taking pain medication 6. BLOOD THINNER MEDICINES: "Do you take any blood thinners?" (e.g., Coumadin / warfarin, Pradaxa / dabigatran, aspirin)     Not sure but she is on a blood thinner 7. HEMODYNAMIC STATUS: "Are you weak or feeling lightheaded?" If Yes, ask: "Can you stand and walk normally?"  Denies  Protocols used: Vaginal Bleeding - Abnormal-A-AH

## 2023-05-01 ENCOUNTER — Ambulatory Visit: Payer: Self-pay

## 2023-05-06 ENCOUNTER — Telehealth: Payer: Self-pay

## 2023-05-06 ENCOUNTER — Inpatient Hospital Stay (HOSPITAL_COMMUNITY)
Admission: EM | Admit: 2023-05-06 | Discharge: 2023-05-15 | DRG: 813 | Disposition: A | Discharge status: Home / Self Care | Attending: Family Medicine | Admitting: Family Medicine

## 2023-05-06 ENCOUNTER — Encounter: Payer: Medicaid Other | Admitting: Registered Nurse

## 2023-05-06 DIAGNOSIS — R7401 Elevation of levels of liver transaminase levels: Secondary | ICD-10-CM

## 2023-05-06 DIAGNOSIS — T45515A Adverse effect of anticoagulants, initial encounter: Secondary | ICD-10-CM | POA: Diagnosis present

## 2023-05-06 DIAGNOSIS — J189 Pneumonia, unspecified organism: Secondary | ICD-10-CM

## 2023-05-06 DIAGNOSIS — Z833 Family history of diabetes mellitus: Secondary | ICD-10-CM

## 2023-05-06 DIAGNOSIS — Z98891 History of uterine scar from previous surgery: Secondary | ICD-10-CM

## 2023-05-06 DIAGNOSIS — K5900 Constipation, unspecified: Secondary | ICD-10-CM | POA: Diagnosis present

## 2023-05-06 DIAGNOSIS — E871 Hypo-osmolality and hyponatremia: Secondary | ICD-10-CM | POA: Diagnosis present

## 2023-05-06 DIAGNOSIS — Z8249 Family history of ischemic heart disease and other diseases of the circulatory system: Secondary | ICD-10-CM

## 2023-05-06 DIAGNOSIS — E538 Deficiency of other specified B group vitamins: Secondary | ICD-10-CM

## 2023-05-06 DIAGNOSIS — I6602 Occlusion and stenosis of left middle cerebral artery: Secondary | ICD-10-CM

## 2023-05-06 DIAGNOSIS — D6862 Lupus anticoagulant syndrome: Secondary | ICD-10-CM | POA: Diagnosis present

## 2023-05-06 DIAGNOSIS — Z2839 Other underimmunization status: Secondary | ICD-10-CM

## 2023-05-06 DIAGNOSIS — R768 Other specified abnormal immunological findings in serum: Secondary | ICD-10-CM

## 2023-05-06 DIAGNOSIS — Z3480 Encounter for supervision of other normal pregnancy, unspecified trimester: Secondary | ICD-10-CM

## 2023-05-06 DIAGNOSIS — N920 Excessive and frequent menstruation with regular cycle: Secondary | ICD-10-CM | POA: Diagnosis present

## 2023-05-06 DIAGNOSIS — Z7901 Long term (current) use of anticoagulants: Secondary | ICD-10-CM

## 2023-05-06 DIAGNOSIS — O0933 Supervision of pregnancy with insufficient antenatal care, third trimester: Secondary | ICD-10-CM

## 2023-05-06 DIAGNOSIS — D6861 Antiphospholipid syndrome: Secondary | ICD-10-CM

## 2023-05-06 DIAGNOSIS — F1210 Cannabis abuse, uncomplicated: Secondary | ICD-10-CM

## 2023-05-06 DIAGNOSIS — R7881 Bacteremia: Secondary | ICD-10-CM

## 2023-05-06 DIAGNOSIS — D696 Thrombocytopenia, unspecified: Secondary | ICD-10-CM | POA: Diagnosis not present

## 2023-05-06 DIAGNOSIS — Z86711 Personal history of pulmonary embolism: Secondary | ICD-10-CM | POA: Diagnosis present

## 2023-05-06 DIAGNOSIS — D509 Iron deficiency anemia, unspecified: Secondary | ICD-10-CM | POA: Diagnosis present

## 2023-05-06 DIAGNOSIS — Z79899 Other long term (current) drug therapy: Secondary | ICD-10-CM

## 2023-05-06 DIAGNOSIS — Z87891 Personal history of nicotine dependence: Secondary | ICD-10-CM

## 2023-05-06 DIAGNOSIS — D649 Anemia, unspecified: Secondary | ICD-10-CM | POA: Diagnosis present

## 2023-05-06 DIAGNOSIS — M79604 Pain in right leg: Secondary | ICD-10-CM | POA: Diagnosis present

## 2023-05-06 DIAGNOSIS — O34219 Maternal care for unspecified type scar from previous cesarean delivery: Secondary | ICD-10-CM

## 2023-05-06 DIAGNOSIS — T85848A Pain due to other internal prosthetic devices, implants and grafts, initial encounter: Secondary | ICD-10-CM

## 2023-05-06 DIAGNOSIS — D582 Other hemoglobinopathies: Secondary | ICD-10-CM

## 2023-05-06 DIAGNOSIS — D500 Iron deficiency anemia secondary to blood loss (chronic): Secondary | ICD-10-CM

## 2023-05-06 DIAGNOSIS — R451 Restlessness and agitation: Secondary | ICD-10-CM

## 2023-05-06 DIAGNOSIS — E440 Moderate protein-calorie malnutrition: Secondary | ICD-10-CM

## 2023-05-06 DIAGNOSIS — F39 Unspecified mood [affective] disorder: Secondary | ICD-10-CM | POA: Diagnosis present

## 2023-05-06 DIAGNOSIS — R6510 Systemic inflammatory response syndrome (SIRS) of non-infectious origin without acute organ dysfunction: Secondary | ICD-10-CM

## 2023-05-06 DIAGNOSIS — Z30017 Encounter for initial prescription of implantable subdermal contraceptive: Secondary | ICD-10-CM

## 2023-05-06 DIAGNOSIS — G894 Chronic pain syndrome: Secondary | ICD-10-CM | POA: Diagnosis present

## 2023-05-06 DIAGNOSIS — I69320 Aphasia following cerebral infarction: Secondary | ICD-10-CM

## 2023-05-06 DIAGNOSIS — N946 Dysmenorrhea, unspecified: Secondary | ICD-10-CM | POA: Diagnosis present

## 2023-05-06 DIAGNOSIS — I63512 Cerebral infarction due to unspecified occlusion or stenosis of left middle cerebral artery: Secondary | ICD-10-CM

## 2023-05-06 DIAGNOSIS — Z931 Gastrostomy status: Secondary | ICD-10-CM

## 2023-05-06 DIAGNOSIS — Z8673 Personal history of transient ischemic attack (TIA), and cerebral infarction without residual deficits: Secondary | ICD-10-CM

## 2023-05-06 DIAGNOSIS — I69351 Hemiplegia and hemiparesis following cerebral infarction affecting right dominant side: Secondary | ICD-10-CM

## 2023-05-06 DIAGNOSIS — R400 Somnolence: Secondary | ICD-10-CM | POA: Diagnosis not present

## 2023-05-06 DIAGNOSIS — E876 Hypokalemia: Secondary | ICD-10-CM | POA: Diagnosis present

## 2023-05-06 DIAGNOSIS — K567 Ileus, unspecified: Secondary | ICD-10-CM

## 2023-05-06 LAB — IRON AND TIBC
Iron: 34 ug/dL (ref 28–170)
Saturation Ratios: 10 % — ABNORMAL LOW (ref 10.4–31.8)
TIBC: 329 ug/dL (ref 250–450)
UIBC: 295 ug/dL

## 2023-05-06 LAB — CBC
HCT: 23.4 % — ABNORMAL LOW (ref 36.0–46.0)
Hemoglobin: 7.6 g/dL — ABNORMAL LOW (ref 12.0–15.0)
MCH: 26.8 pg (ref 26.0–34.0)
MCHC: 32.5 g/dL (ref 30.0–36.0)
MCV: 82.4 fL (ref 80.0–100.0)
Platelets: 32 10*3/uL — ABNORMAL LOW (ref 150–400)
RBC: 2.84 MIL/uL — ABNORMAL LOW (ref 3.87–5.11)
RDW: 15 % (ref 11.5–15.5)
WBC: 4.7 10*3/uL (ref 4.0–10.5)
nRBC: 0 % (ref 0.0–0.2)

## 2023-05-06 LAB — FOLATE: Folate: 25.1 ng/mL (ref >5.9)

## 2023-05-06 LAB — FERRITIN: Ferritin: 8 ng/mL — ABNORMAL LOW (ref 11–307)

## 2023-05-06 LAB — RETICULOCYTES
Immature Retic Fract: 26.7 % — ABNORMAL HIGH (ref 2.3–15.9)
RBC.: 2.85 MIL/uL — ABNORMAL LOW (ref 3.87–5.11)
Retic Count, Absolute: 50.2 10*3/uL (ref 19.0–186.0)
Retic Ct Pct: 1.8 % (ref 0.4–3.1)

## 2023-05-06 LAB — VITAMIN B12: Vitamin B-12: 1198 pg/mL — ABNORMAL HIGH (ref 180–914)

## 2023-05-06 MED ORDER — ACETAMINOPHEN 650 MG RE SUPP: 650.0000 mg | Freq: Four times a day (QID) | RECTAL | Status: DC | PRN

## 2023-05-06 MED ORDER — OXYCODONE HCL 5 MG PO TABS
5.0000 mg | ORAL_TABLET | Freq: Four times a day (QID) | ORAL | Status: DC | PRN
  Administered 2023-05-06 – 2023-05-15 (×25): 5 mg via ORAL
  Filled 2023-05-06 (×26): qty 1

## 2023-05-06 MED ORDER — NALOXONE HCL 0.4 MG/ML IJ SOLN: 0.4000 mg | INTRAMUSCULAR | Status: DC | PRN

## 2023-05-06 MED ORDER — DOCUSATE SODIUM 100 MG PO CAPS
100.0000 mg | ORAL_CAPSULE | Freq: Three times a day (TID) | ORAL | Status: DC
  Administered 2023-05-06 – 2023-05-15 (×18): 100 mg via ORAL
  Filled 2023-05-06 (×23): qty 1

## 2023-05-06 MED ORDER — FLUOXETINE HCL 10 MG PO CAPS
10.0000 mg | ORAL_CAPSULE | Freq: Every day | ORAL | Status: DC
  Administered 2023-05-07 – 2023-05-15 (×9): 10 mg via ORAL
  Filled 2023-05-06 (×9): qty 1

## 2023-05-06 MED ORDER — GABAPENTIN 100 MG PO CAPS
100.0000 mg | ORAL_CAPSULE | Freq: Three times a day (TID) | ORAL | Status: DC
  Administered 2023-05-06 – 2023-05-15 (×26): 100 mg via ORAL
  Filled 2023-05-06 (×27): qty 1

## 2023-05-06 MED ORDER — ACETAMINOPHEN 325 MG PO TABS
650.0000 mg | ORAL_TABLET | Freq: Four times a day (QID) | ORAL | Status: DC | PRN
  Administered 2023-05-07 – 2023-05-12 (×8): 650 mg via ORAL
  Filled 2023-05-06 (×8): qty 2

## 2023-05-06 MED ORDER — POLYETHYLENE GLYCOL 3350 17 G PO PACK
17.0000 g | PACK | Freq: Two times a day (BID) | ORAL | Status: DC
  Administered 2023-05-09 – 2023-05-15 (×7): 17 g via ORAL
  Filled 2023-05-06 (×15): qty 1

## 2023-05-06 MED ORDER — CLONAZEPAM 0.5 MG PO TABS
0.5000 mg | ORAL_TABLET | Freq: Every day | ORAL | Status: DC
  Administered 2023-05-06 – 2023-05-14 (×9): 0.5 mg via ORAL
  Filled 2023-05-06 (×9): qty 1

## 2023-05-06 MED ORDER — SODIUM CHLORIDE 0.9% IV SOLUTION: Freq: Once | INTRAVENOUS | Status: DC

## 2023-05-06 NOTE — Telephone Encounter (Signed)
 Called from ED. Report thrombocytopenia and sent by PCP to ED. Platelet was 15 from outside. Repeat pending. Patient is on apixaban. Report menstrual bleeding prior to presentation. No bruising and otherwise stable. Not on antiplatelet from chart.  Recommend holding apixaban if repeat platelet <50K or bleeding. If recurrent bleeding may give 1 U of platelet. Trend CBC daily.  Also found history of deficiency in b12, folate and iron. Recommend repeat testing and replace as indicated.   Will inform primary oncologist in the morning.

## 2023-05-06 NOTE — ED Notes (Signed)
 Able top collect labs but unsuccessful sticks x 2 due to veins blowing

## 2023-05-06 NOTE — ED Notes (Signed)
 Patient requesting pain medication. Provider notified no new orders at this time.

## 2023-05-06 NOTE — ED Provider Notes (Addendum)
 Jacqueline [name] Provider Note   CSN: [id number] Arrival date & time: 05/06/23  1751     History  Chief Complaint  Jacqueline [name] presents with   Abnormal Lab    Jacqueline [name] is a 23 y.o. female.  HPI    23 y/o Jacqueline [name] comes in with cc of abnormal labs.  Jacqueline [name] has past medical history of Jacqueline [name] pregnancy, unprovoked PE with positive lupus anticoagulant for which she is supposed to be on Eliquis, hemoglobin C trait, anemia iron deficiency/thrombocytopenia and tobacco use.  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  Subsequently, Jacqueline [name] followed up with [facility] PCP and was advised to go to the ER because her platelet count came back at 15,000 today.  Collateral history provided by Jacqueline [name].  Jacqueline [name] suffered from aphasia from the stroke.  According to the Jacqueline [name], Jacqueline [name] had heavy periods last week that ended on Thursday.  On Monday she started bleeding again, but not as much.  Jacqueline [name] denies any bloody stools, dark stools, headaches. Jacqueline [name] took her Eliquis this morning.  Home Medications Prior to Admission medications   Medication Sig Start Date End Date Taking? Authorizing Provider  acetaminophen (TYLENOL) 325 MG tablet Take 2 tablets (650 mg total) by mouth every 4 (four) hours as needed for mild pain (pain score 1-3) (or temp > 37.5 C (99.5 F)). 04/22/23  Yes Angiulli, Mcarthur Rossetti, PA-C  clonazePAM (KLONOPIN) 0.5 MG tablet Take 1 tablet (0.5 mg total) by mouth at bedtime. 04/15/23  Yes Angiulli, Mcarthur Rossetti, PA-C  cyanocobalamin 1000 MCG tablet Take 2 tablets (2,000 mcg total) by mouth daily. 04/15/23  Yes Angiulli, Mcarthur Rossetti, PA-C  docusate sodium (COLACE) 100 MG capsule Take 1 capsule (100 mg total) by mouth 3 (three) times daily. 04/23/23  Yes Angiulli, Mcarthur Rossetti, PA-C  FLUoxetine (PROZAC) 10 MG capsule Take 1 capsule by mouth daily. 05/06/23  Yes [provider]   folic acid (FOLVITE) 1 MG tablet Take 1 tablet (1 mg total) by mouth daily. 04/15/23  Yes Angiulli, Mcarthur Rossetti, PA-C  gabapentin (NEURONTIN) 100 MG capsule Take 1 capsule (100 mg total) by mouth 3 (three) times daily. 04/22/23  Yes Angiulli, Mcarthur Rossetti, PA-C  iron polysaccharides (NIFEREX) 150 MG capsule Take 1 capsule (150 mg total) by mouth daily. 04/15/23  Yes Angiulli, Mcarthur Rossetti, PA-C  Multiple Vitamin (MULTIVITAMIN WITH MINERALS) TABS tablet Take 1 tablet by mouth daily. 04/15/23  Yes Angiulli, Mcarthur Rossetti, PA-C  naloxone [facility]) nasal spray 4 mg/0.1 mL Place 1 spray into the nose as needed (opioid overdose). 05/06/23  Yes [provider]  oxyCODONE (OXY IR/ROXICODONE) 5 MG immediate release tablet Take 1 tablet (5 mg total) by mouth every 4 (four) hours as needed (perceived pain). 04/15/23  Yes Angiulli, Mcarthur Rossetti, PA-C  polyethylene glycol (MIRALAX / GLYCOLAX) 17 g packet Take 17 g by mouth 2 (two) times daily. 04/15/23  Yes Angiulli, Mcarthur Rossetti, PA-C  apixaban (ELIQUIS) 5 MG TABS tablet Take 1 tablet (5 mg total) by mouth 2 (two) times daily. Jacqueline [name] not taking: Reported on 05/06/2023 04/15/23   Angiulli, Mcarthur Rossetti, PA-C      Allergies    Jacqueline [name] has no known allergies.    Review of Systems   Review of Systems  All other systems reviewed and are negative.   Physical Exam Updated Vital Signs BP 120/86   Pulse 75   Temp 98.9 F (37.2  C) (Oral)   Resp 18   SpO2 97%  Physical Exam Vitals and nursing note reviewed.  Constitutional:      Appearance: She is well-developed.  HENT:     Head: Atraumatic.  Eyes:     Extraocular Movements: Extraocular movements intact.  Cardiovascular:     Rate and Rhythm: Normal rate.  Pulmonary:     Effort: Pulmonary effort is normal.  Musculoskeletal:     Cervical back: Neck supple.  Skin:    General: Skin is warm and dry.     Comments: No clear evidence of bruising noted over the upper extremity or abdomen  Neurological:     Mental Status: She is  alert and oriented to person, place, and time. Mental status is at baseline.     ED Results / Procedures / Treatments   Labs (all labs ordered are listed, but only abnormal results are displayed) Labs Reviewed  CBC - Abnormal; Notable for the following components:      Result Value   RBC 2.84 (*)    Hemoglobin 7.6 (*)    HCT 23.4 (*)    Platelets 32 (*)    All other components within normal limits  RETICULOCYTES - Abnormal; Notable for the following components:   RBC. 2.85 (*)    Immature Retic Fract 26.7 (*)    All other components within normal limits  URINALYSIS, ROUTINE W REFLEX MICROSCOPIC  PREGNANCY, URINE  VITAMIN B12  FOLATE  IRON AND TIBC  FERRITIN  TYPE AND SCREEN  PREPARE PLATELET PHERESIS    EKG None  Radiology No results found.  Procedures .Critical Care  Performed by: Derwood Kaplan, MD Authorized by: Derwood Kaplan, MD   Critical care provider statement:    Critical care time (minutes):  51   Critical care was necessary to treat or prevent imminent or life-threatening deterioration of the following conditions:  Circulatory failure (Hematologic emergency, low platelet count)   Critical care was time spent personally by me on the following activities:  Development of treatment plan with Jacqueline [name] or surrogate, discussions with consultants, evaluation of Jacqueline response to treatment, examination of Jacqueline [name], ordering and review of laboratory studies, ordering and review of radiographic studies, ordering and performing treatments and interventions, pulse oximetry, re-evaluation of Jacqueline condition and review of old charts     Medications Ordered in ED Medications  0.9 %  sodium chloride infusion (Manually program via Guardrails IV Fluids) (has no administration in time range)    ED Course/ Medical Decision Making/ A&P                                 Medical Decision Making Amount and/or Complexity of Data Reviewed Labs:  ordered.  Risk Prescription drug management. Decision regarding hospitalization.   23 year old Jacqueline [name] comes in with chief complaint of low platelet count.  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  She comes into the ER with abnormal labs.  It does not appear that Jacqueline [name] has been on any new medications.  Specifically, she is not on any antibiotics.  Differential diagnosis for thrombocytopenia includes medication side effects, HIT, thrombocytopenia due to antiplatelet agents, doac use, severe blood leading to low platelet.  Currently she is stable.  I have reviewed patients records, she did have 2 units of PRBC is Feb. I  called the blood bank, they indicated that Jacqueline [name] had anti K, warm auto antibodies.  I consulted hematology and spoke with Dr. Cherly Hensen.  He recommends that we transfuse the Jacqueline [name] 1 unit platelet right now and repeat the platelet count in the morning.  We need to hold anticoagulation until Jacqueline platelet count is above 50,000.  He will inform Dr. [name] about the Jacqueline [name], so that he can round on the Jacqueline [name] tomorrow.  On labs- the plt is 32K.  We discussed the case with Dr. Cherly Hensen.  He states that continue with the same plan.  Only to get blood transfusion or platelet transfusion if Jacqueline [name] is having profound bleeding, otherwise reassuring recheck the CBC in the morning. Final Clinical Impression(s) / ED Diagnoses Final diagnoses:  Thrombocytopenia (HCC)  Symptomatic anemia    Rx / DC Orders ED Discharge Orders     None         Derwood Kaplan, MD 05/06/23 2004    Derwood Kaplan, MD 05/06/23 2134

## 2023-05-06 NOTE — H&P (Signed)
 History and Physical    Jacqueline [name] ZOX:[id number] DOB: September 15, 2000 DOA: 05/06/2023  PCP: [name], MD  Patient coming from: Home  Chief Complaint: Low platelets on outpatient labs  HPI: Jacqueline [name] is a 23 y.o. 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  She was discharged from the hospital on 2/19.  Hospital course was also complicated by thrombocytopenia (etiology unclear) and menorrhagia for which she was seen by heme-onc and OB/GYN.  She was placed on Megace with ultrasound of the pelvis showing no definite abnormality.  She had a follow-up visit with her PCP today and was sent to the ED due to outpatient labs showing platelet count of 15k.  Vital signs on arrival: Temperature 99.3 F, pulse 75, respiratory rate 16, blood pressure 110/62, and SpO2 100% on room air.  CBC done in the ED showing hemoglobin 7.6 (previously 8-9), MCV 82.4, platelet count 32k.  No significant abnormalities on outpatient BMP done earlier today.  ED physician consulted Dr. Cherly Hensen with heme-onc who recommended giving 1 unit platelets now and trending CBC daily.  Recommended holding off blood transfusion at this time.  Recommended continuing to hold Eliquis if platelet count <50k or bleeding.  Since patient was found to have deficiency of B12, folate, and iron on previous labs, heme-onc recommended repeating testing and replacing as needed.  Anemia panel ordered.  [facility] called to admit.  History provided by the patient and her partner at bedside.  Last week she had heavy menstrual bleeding which ended on Thursday.  Yesterday she started having menstrual bleeding again but this time it is very mild.  Patient does report bleeding of  her gums when she brushes her teeth.  Denies hematemesis, hematochezia, melena, or hematuria.  Denies any headaches.  She is on Eliquis and last dose was yesterday evening.  Not on antiplatelet agents.  No lightheadedness/dizziness, chest pain, or shortness of breath.  She had PEG tube placed during recent hospitalization for stroke but now able to eat by mouth and no longer using the PEG tube.  She is requesting her home oxycodone which she is taking for right leg pain and pain in her abdomen since after placement of PEG tube.  Review of Systems:  Review of Systems  All other systems reviewed and are negative.   Past Medical History:  Diagnosis Date   Bacterial vaginitis 07/10/2016   Candida vaginitis 07/24/2016   Headache in pregnancy, antepartum, third trimester 07/24/2016   Kell isoimmunization during pregnancy     Past Surgical History:  Procedure Laterality Date   CESAREAN SECTION N/A 09/04/2016   Procedure: CESAREAN SECTION;  Surgeon: Levie Heritage, DO;  Location: [facility];  Service: Obstetrics;  Laterality: N/A;   CESAREAN SECTION Bilateral    IR CT HEAD LTD  02/21/2023   IR GASTROSTOMY TUBE MOD SED  03/21/2023   IR PERCUTANEOUS ART THROMBECTOMY/INFUSION INTRACRANIAL INC DIAG ANGIO  02/21/2023   IR REPLC GASTRO/COLONIC TUBE PERCUT W/FLUORO  03/27/2023   RADIOLOGY WITH ANESTHESIA N/A 02/21/2023   Procedure: IR WITH ANESTHESIA;  Surgeon: Julieanne Cotton, MD;  Location: [facility] OR;  Service: Radiology;  Laterality: N/A;     reports that she has been smoking cigars. She has never used smokeless tobacco. She reports current alcohol use. She reports that she does not  use drugs.  No Known Allergies  Family History  Problem Relation Age of Onset   Hypertension Maternal Grandmother    Diabetes Maternal Grandmother    Cancer Neg Hx     Prior to Admission medications   Medication Sig Start Date End Date Taking? Authorizing Provider  acetaminophen (TYLENOL) 325 MG tablet Take  2 tablets (650 mg total) by mouth every 4 (four) hours as needed for mild pain (pain score 1-3) (or temp > 37.5 C (99.5 F)). 04/22/23  Yes Angiulli, Mcarthur Rossetti, PA-C  clonazePAM (KLONOPIN) 0.5 MG tablet Take 1 tablet (0.5 mg total) by mouth at bedtime. 04/15/23  Yes Angiulli, Mcarthur Rossetti, PA-C  cyanocobalamin 1000 MCG tablet Take 2 tablets (2,000 mcg total) by mouth daily. 04/15/23  Yes Angiulli, Mcarthur Rossetti, PA-C  docusate sodium (COLACE) 100 MG capsule Take 1 capsule (100 mg total) by mouth 3 (three) times daily. 04/23/23  Yes Angiulli, Mcarthur Rossetti, PA-C  FLUoxetine (PROZAC) 10 MG capsule Take 1 capsule by mouth daily. 05/06/23  Yes [provider]  folic acid (FOLVITE) 1 MG tablet Take 1 tablet (1 mg total) by mouth daily. 04/15/23  Yes Angiulli, Mcarthur Rossetti, PA-C  gabapentin (NEURONTIN) 100 MG capsule Take 1 capsule (100 mg total) by mouth 3 (three) times daily. 04/22/23  Yes Angiulli, Mcarthur Rossetti, PA-C  iron polysaccharides (NIFEREX) 150 MG capsule Take 1 capsule (150 mg total) by mouth daily. 04/15/23  Yes Angiulli, Mcarthur Rossetti, PA-C  Multiple Vitamin (MULTIVITAMIN WITH MINERALS) TABS tablet Take 1 tablet by mouth daily. 04/15/23  Yes Angiulli, Mcarthur Rossetti, PA-C  naloxone [facility]) nasal spray 4 mg/0.1 mL Place 1 spray into the nose as needed (opioid overdose). 05/06/23  Yes [provider]  oxyCODONE (OXY IR/ROXICODONE) 5 MG immediate release tablet Take 1 tablet (5 mg total) by mouth every 4 (four) hours as needed (perceived pain). 04/15/23  Yes Angiulli, Mcarthur Rossetti, PA-C  polyethylene glycol (MIRALAX / GLYCOLAX) 17 g packet Take 17 g by mouth 2 (two) times daily. 04/15/23  Yes Angiulli, Mcarthur Rossetti, PA-C  apixaban (ELIQUIS) 5 MG TABS tablet Take 1 tablet (5 mg total) by mouth 2 (two) times daily. Patient not taking: Reported on 05/06/2023 04/15/23   Charlton Amor, PA-C    Physical Exam: Vitals:   05/06/23 1755 05/06/23 2101  BP: 110/62 120/86  Pulse: 75 75  Resp: 16 18  Temp: 99.3 F (37.4 C) 98.9 F (37.2  C)  TempSrc: Oral Oral  SpO2: 100% 97%    Physical Exam Vitals reviewed.  Constitutional:      General: She is not in acute distress. HENT:     Head: Normocephalic and atraumatic.  Eyes:     Extraocular Movements: Extraocular movements intact.  Cardiovascular:     Rate and Rhythm: Normal rate and regular rhythm.     Pulses: Normal pulses.  Pulmonary:     Effort: Pulmonary effort is normal. No respiratory distress.     Breath sounds: Normal breath sounds. No wheezing or rales.  Abdominal:     General: Bowel sounds are normal. There is no distension.     Palpations: Abdomen is soft.     Tenderness: There is no abdominal tenderness. There is no guarding.  Musculoskeletal:     Cervical back: Normal range of motion.     Right lower leg: No edema.     Left lower leg: No edema.  Skin:    General: Skin is warm and dry.  Neurological:  Mental Status: She is alert and oriented to person, place, and time.     Comments: Right-sided weakness and mild expressive aphasia since after recent stroke, no acute change     Labs on Admission: I have personally reviewed following labs and imaging studies  CBC: Recent Labs  Lab 05/06/23 1954  WBC 4.7  HGB 7.6*  HCT 23.4*  MCV 82.4  PLT 32*   Basic Metabolic Panel: No results for input(s): "NA", "K", "CL", "CO2", "GLUCOSE", "BUN", "CREATININE", "CALCIUM", "MG", "PHOS" in the last 168 hours. GFR: CrCl cannot be calculated (Unknown ideal weight.). Liver Function Tests: No results for input(s): "AST", "ALT", "ALKPHOS", "BILITOT", "PROT", "ALBUMIN" in the last 168 hours. No results for input(s): "LIPASE", "AMYLASE" in the last 168 hours. No results for input(s): "AMMONIA" in the last 168 hours. Coagulation Profile: No results for input(s): "INR", "PROTIME" in the last 168 hours. Cardiac Enzymes: No results for input(s): "CKTOTAL", "CKMB", "CKMBINDEX", "TROPONINI" in the last 168 hours. BNP (last 3 results) No results for input(s):  "PROBNP" in the last 8760 hours. HbA1C: No results for input(s): "HGBA1C" in the last 72 hours. CBG: No results for input(s): "GLUCAP" in the last 168 hours. Lipid Profile: No results for input(s): "CHOL", "HDL", "LDLCALC", "TRIG", "CHOLHDL", "LDLDIRECT" in the last 72 hours. Thyroid Function Tests: No results for input(s): "TSH", "T4TOTAL", "FREET4", "T3FREE", "THYROIDAB" in the last 72 hours. Anemia Panel: Recent Labs    05/06/23 1954  RETICCTPCT 1.8   Urine analysis:    Component Value Date/Time   COLORURINE AMBER (A) 04/01/2023 1207   APPEARANCEUR CLOUDY (A) 04/01/2023 1207   LABSPEC 1.018 04/01/2023 1207   PHURINE 7.0 04/01/2023 1207   GLUCOSEU NEGATIVE 04/01/2023 1207   HGBUR LARGE (A) 04/01/2023 1207   BILIRUBINUR NEGATIVE 04/01/2023 1207   KETONESUR NEGATIVE 04/01/2023 1207   PROTEINUR >=300 (A) 04/01/2023 1207   UROBILINOGEN 1.0 05/08/2021 1944   NITRITE NEGATIVE 04/01/2023 1207   LEUKOCYTESUR NEGATIVE 04/01/2023 1207    Radiological Exams on Admission: No results found.  Assessment and Plan  Thrombocytopenia Platelet count 15k on outpatient labs and 32k on repeat labs done in the ED.  Previously platelet count was ranging between 50-60k on labs done 2-3 weeks ago.  Patient had heavy menstrual bleeding last week which has now subsided.  Currently reporting minimal vaginal bleeding and some bleeding of her gums when she brushes her teeth.  Denies any hematemesis, hematochezia, melena, or hematuria.  History of recent stroke with residual right-sided weakness and mild expressive aphasia, no acute neurologic change.  Patient is on Eliquis and last dose was yesterday evening.  Not on antiplatelet agents. Heme-onc consulted and recommended giving 1 unit platelets and trending CBC daily.  Recommended holding Eliquis if platelet count <50k or bleeding. Since patient was found to have deficiency of B12, folate, and iron on previous labs, heme-onc recommended repeating testing  and replacing as needed.  Anemia panel ordered.  Normocytic anemia Hemoglobin currently 7.6, previously 8-9.  Heme-onc recommended holding off giving blood transfusion at this time.  Type and screen, trend CBC daily.  Transfuse PRBCs if hemoglobin less than 7.  History of lupus anticoagulant History of PE History of recent large left MCA stroke Eliquis held at this time due to thrombocytopenia.  Mood disorder and chronic pain Continue home medications.  DVT prophylaxis: SCDs Code Status: Full Code (discussed with the patient) Family Communication: Patient's partner at bedside. Consults called: Heme-onc Level of care: Progressive Care Unit Admission status: It is my  clinical opinion that referral for OBSERVATION is reasonable and necessary in this patient based on the above information provided. The aforementioned taken together are felt to place the patient at high risk for further clinical deterioration. However, it is anticipated that the patient may be medically stable for discharge from the hospital within 24 to 48 hours.  John Giovanni MD [facility]  If 7PM-7AM, please contact night-coverage [email or web address]  05/06/2023, 9:33 PM

## 2023-05-06 NOTE — ED Triage Notes (Signed)
 Pt arrives with boyfriend who reports that pt recently had a stroke. She was discharged recently and placed on Eliquis. Currently on her menstrual cycle. She was at an outpatient follow up appointment and was told that her PLT was 15. Pt denies dizziness, SOB.

## 2023-05-07 ENCOUNTER — Encounter (HOSPITAL_COMMUNITY): Payer: Self-pay | Admitting: Internal Medicine

## 2023-05-07 ENCOUNTER — Other Ambulatory Visit: Payer: Self-pay

## 2023-05-07 DIAGNOSIS — D696 Thrombocytopenia, unspecified: Secondary | ICD-10-CM | POA: Diagnosis present

## 2023-05-07 DIAGNOSIS — Z8673 Personal history of transient ischemic attack (TIA), and cerebral infarction without residual deficits: Secondary | ICD-10-CM | POA: Diagnosis not present

## 2023-05-07 DIAGNOSIS — Z833 Family history of diabetes mellitus: Secondary | ICD-10-CM | POA: Diagnosis not present

## 2023-05-07 DIAGNOSIS — Z86711 Personal history of pulmonary embolism: Secondary | ICD-10-CM

## 2023-05-07 DIAGNOSIS — D6862 Lupus anticoagulant syndrome: Secondary | ICD-10-CM | POA: Diagnosis present

## 2023-05-07 DIAGNOSIS — F39 Unspecified mood [affective] disorder: Secondary | ICD-10-CM | POA: Diagnosis present

## 2023-05-07 DIAGNOSIS — Z87891 Personal history of nicotine dependence: Secondary | ICD-10-CM | POA: Diagnosis not present

## 2023-05-07 DIAGNOSIS — N946 Dysmenorrhea, unspecified: Secondary | ICD-10-CM | POA: Diagnosis present

## 2023-05-07 DIAGNOSIS — I69351 Hemiplegia and hemiparesis following cerebral infarction affecting right dominant side: Secondary | ICD-10-CM | POA: Diagnosis not present

## 2023-05-07 DIAGNOSIS — Z7901 Long term (current) use of anticoagulants: Secondary | ICD-10-CM | POA: Diagnosis not present

## 2023-05-07 DIAGNOSIS — Z79899 Other long term (current) drug therapy: Secondary | ICD-10-CM | POA: Diagnosis not present

## 2023-05-07 DIAGNOSIS — N920 Excessive and frequent menstruation with regular cycle: Secondary | ICD-10-CM | POA: Diagnosis present

## 2023-05-07 DIAGNOSIS — D6861 Antiphospholipid syndrome: Secondary | ICD-10-CM | POA: Diagnosis not present

## 2023-05-07 DIAGNOSIS — E538 Deficiency of other specified B group vitamins: Secondary | ICD-10-CM | POA: Diagnosis not present

## 2023-05-07 DIAGNOSIS — K5900 Constipation, unspecified: Secondary | ICD-10-CM | POA: Diagnosis present

## 2023-05-07 DIAGNOSIS — D649 Anemia, unspecified: Secondary | ICD-10-CM | POA: Diagnosis present

## 2023-05-07 DIAGNOSIS — M79604 Pain in right leg: Secondary | ICD-10-CM | POA: Diagnosis present

## 2023-05-07 DIAGNOSIS — I69320 Aphasia following cerebral infarction: Secondary | ICD-10-CM | POA: Diagnosis not present

## 2023-05-07 DIAGNOSIS — Z931 Gastrostomy status: Secondary | ICD-10-CM | POA: Diagnosis not present

## 2023-05-07 DIAGNOSIS — E876 Hypokalemia: Secondary | ICD-10-CM | POA: Diagnosis present

## 2023-05-07 DIAGNOSIS — T45515A Adverse effect of anticoagulants, initial encounter: Secondary | ICD-10-CM | POA: Diagnosis present

## 2023-05-07 DIAGNOSIS — G894 Chronic pain syndrome: Secondary | ICD-10-CM | POA: Diagnosis present

## 2023-05-07 DIAGNOSIS — D509 Iron deficiency anemia, unspecified: Secondary | ICD-10-CM | POA: Diagnosis present

## 2023-05-07 DIAGNOSIS — R400 Somnolence: Secondary | ICD-10-CM | POA: Diagnosis not present

## 2023-05-07 DIAGNOSIS — E871 Hypo-osmolality and hyponatremia: Secondary | ICD-10-CM | POA: Diagnosis present

## 2023-05-07 DIAGNOSIS — Z8249 Family history of ischemic heart disease and other diseases of the circulatory system: Secondary | ICD-10-CM | POA: Diagnosis not present

## 2023-05-07 LAB — CBC
HCT: 21.2 % — ABNORMAL LOW (ref 36.0–46.0)
Hemoglobin: 6.9 g/dL — CL (ref 12.0–15.0)
MCH: 26.5 pg (ref 26.0–34.0)
MCHC: 32.5 g/dL (ref 30.0–36.0)
MCV: 81.5 fL (ref 80.0–100.0)
Platelets: 31 10*3/uL — ABNORMAL LOW (ref 150–400)
RBC: 2.6 MIL/uL — ABNORMAL LOW (ref 3.87–5.11)
RDW: 15.1 % (ref 11.5–15.5)
WBC: 4.1 10*3/uL (ref 4.0–10.5)
nRBC: 0 % (ref 0.0–0.2)

## 2023-05-07 LAB — URINALYSIS, ROUTINE W REFLEX MICROSCOPIC
Bacteria, UA: NONE SEEN
Bilirubin Urine: NEGATIVE
Glucose, UA: NEGATIVE mg/dL
Hgb urine dipstick: NEGATIVE
Ketones, ur: NEGATIVE mg/dL
Nitrite: NEGATIVE
Protein, ur: NEGATIVE mg/dL
Specific Gravity, Urine: 1.01 (ref 1.005–1.030)
pH: 6 (ref 5.0–8.0)

## 2023-05-07 LAB — BASIC METABOLIC PANEL
Anion gap: 7 (ref 5–15)
BUN: 10 mg/dL (ref 6–20)
CO2: 18 mmol/L — ABNORMAL LOW (ref 22–32)
Calcium: 7.1 mg/dL — ABNORMAL LOW (ref 8.9–10.3)
Chloride: 116 mmol/L — ABNORMAL HIGH (ref 98–111)
Creatinine, Ser: 0.65 mg/dL (ref 0.44–1.00)
GFR, Estimated: >60 mL/min (ref >60)
Glucose, Bld: 73 mg/dL (ref 70–99)
Potassium: 3 mmol/L — ABNORMAL LOW (ref 3.5–5.1)
Sodium: 141 mmol/L (ref 135–145)

## 2023-05-07 LAB — PREPARE PLATELET PHERESIS: Unit division: 0

## 2023-05-07 LAB — BPAM PLATELET PHERESIS
Blood Product Expiration Date: 202503072359
ISSUE DATE / TIME: 202503042221
Unit Type and Rh: 5100

## 2023-05-07 LAB — PREGNANCY, URINE: Preg Test, Ur: NEGATIVE

## 2023-05-07 LAB — PREPARE RBC (CROSSMATCH)

## 2023-05-07 LAB — MAGNESIUM: Magnesium: 1.4 mg/dL — ABNORMAL LOW (ref 1.7–2.4)

## 2023-05-07 MED ORDER — POTASSIUM CHLORIDE CRYS ER 20 MEQ PO TBCR
40.0000 meq | EXTENDED_RELEASE_TABLET | Freq: Two times a day (BID) | ORAL | Status: AC
  Administered 2023-05-07 (×2): 40 meq via ORAL
  Filled 2023-05-07 (×2): qty 2

## 2023-05-07 MED ORDER — SODIUM CHLORIDE 0.9% IV SOLUTION: Freq: Once | INTRAVENOUS | Status: AC

## 2023-05-07 MED ORDER — MORPHINE SULFATE (PF) 2 MG/ML IV SOLN
1.0000 mg | Freq: Once | INTRAVENOUS | Status: AC
  Administered 2023-05-07: 1 mg via INTRAVENOUS
  Filled 2023-05-07: qty 1

## 2023-05-07 MED ORDER — MAGNESIUM SULFATE 4 GM/100ML IV SOLN
4.0000 g | Freq: Once | INTRAVENOUS | Status: AC
  Administered 2023-05-07: 4 g via INTRAVENOUS
  Filled 2023-05-07 (×2): qty 100

## 2023-05-07 NOTE — ED Notes (Signed)
 Patient continues to self remove monitor devices

## 2023-05-07 NOTE — ED Notes (Signed)
 Provided pt a cola and a patient experience card.  Spoke with pt's mom, gave update on bed

## 2023-05-07 NOTE — ED Notes (Signed)
 Provided patient with warm blanket

## 2023-05-07 NOTE — Plan of Care (Signed)

## 2023-05-07 NOTE — ED Notes (Signed)
 Readjusted patient HOB and provided patient with beverage

## 2023-05-07 NOTE — Progress Notes (Addendum)
 [facility]                                                                               Jacqueline [name], is a 23 y.o. female, DOB - April 26, 2000, [id number] Admit date - 05/06/2023    Outpatient Primary MD for the patient is [name], MD  LOS - 0  days    Brief summary   Jacqueline [name] is a 23 y.o. 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  She was discharged from the hospital on 2/19.  Hospital course was also complicated by thrombocytopenia (etiology unclear) and menorrhagia for which she was seen by heme-onc and OB/GYN.  She was placed on Megace with ultrasound of the pelvis showing no definite abnormality.  She had a follow-up visit with her PCP today and was sent to the ED due to outpatient labs showing platelet count of 15k.  ED physician consulted Dr. Cherly Hensen with heme-onc who recommended giving 1 unit platelets now and trending CBC daily.  Recommended continuing to hold Eliquis if platelet count <50k or bleeding.  Since patient was found to have deficiency of B12, folate, and iron on previous labs, heme-onc recommended repeating testing and replacing as needed.  Anemia panel ordered.  [facility] called to admit.    Assessment & Plan    Assessment and Plan:  Thrombocytopenia:  Chronic, in the setting of lupus anticoagulant and PE and Eliquis use. Currently reporting minimal vaginal bleeding and some bleeding of her gums when she brushes her teeth.  Patient is on Eliquis and last dose was 3/3.   Heme onc consulted and recommended giving 1 unit of platelets and trending CBC daily.  Recommended holding Eliquis if platelet count <50k or bleeding. Since patient was found to have  deficiency of B12, folate, and iron on previous labs, heme-onc recommended repeating testing and replacing as needed.  Ferritin is 8, folate 25.1 and and vitamin B12 is 1198, iron level is 34.  S/p 1 unit of platelet transfusion. Platelet level is 31,000 this am, will hold off eliquis.   Ordered 1 unit of prbc transfusion.    Hypokalemia Replaced. Keep K >4.  Check magnesium level pending.    H/o Lupus anti coagulant, H/o PE, h/o recent large left MCA stroke Eliquis held due to thrombocytopenia.    Mood Disorder and Chronic pain syndrome:  Resume home meds.    Estimated body mass index is 28.57 kg/m as calculated from the following:   Height as of 03/25/23: 5\' 4"  (1.626 m).   Weight as of 04/16/23: 75.5 kg.  Code Status: full code.  DVT Prophylaxis:  SCDs Start: 05/06/23 2211   Level of Care: Level of care: Med-Surg Family Communication: None at bedside.   Disposition Plan:     Remains inpatient appropriate:  pending clinical improvement.   Procedures:  None.   Consultants:   Hematology Oncology.   Antimicrobials:   Anti-infectives (From admission, onward)    None  Medications  Scheduled Meds:  sodium chloride   Intravenous Once   sodium chloride   Intravenous Once   clonazePAM  0.5 mg Oral QHS   docusate sodium  100 mg Oral TID   FLUoxetine  10 mg Oral Daily   gabapentin  100 mg Oral TID   polyethylene glycol  17 g Oral BID   potassium chloride  40 mEq Oral BID   Continuous Infusions:  magnesium sulfate bolus IVPB     PRN Meds:.acetaminophen **OR** acetaminophen, naLOXone (NARCAN)  injection, oxyCODONE    Subjective:   Jacqueline [name] was seen and examined today.  No new complaints. She denies any menstrual bleeding.   Objective:   Vitals:   05/07/23 0747 05/07/23 0900 05/07/23 1139 05/07/23 1212  BP: 98/62 97/63  97/64  Pulse: 91 88  94  Resp: 16 16  16   Temp: 97.9 F (36.6 C)  97.9 F (36.6 C)   TempSrc: Oral  Oral   SpO2: 100%  100%  100%   No intake or output data in the 24 hours ending 05/07/23 1300 There were no vitals filed for this visit.   Exam General exam: Appears calm and comfortable  Respiratory system: Clear to auscultation. Respiratory effort normal. Cardiovascular system: S1 & S2 heard, RRR.  Gastrointestinal system: Abdomen is nondistended, soft and nontender. Central nervous system: Alert and oriented. No focal neurological deficits. Extremities: Symmetric 5 x 5 power. Skin: No rashes, lesions or ulcers Psychiatry:Mood & affect appropriate.    Data Reviewed:  I have personally reviewed following labs and imaging studies   CBC Lab Results  Component Value Date   WBC 4.1 05/07/2023   RBC 2.60 (L) 05/07/2023   HGB 6.9 (LL) 05/07/2023   HCT 21.2 (L) 05/07/2023   MCV 81.5 05/07/2023   MCH 26.5 05/07/2023   PLT 31 (L) 05/07/2023   MCHC 32.5 05/07/2023   RDW 15.1 05/07/2023   LYMPHSABS 1.6 04/08/2023   MONOABS 0.4 04/08/2023   EOSABS 0.1 04/08/2023   BASOSABS 0.0 04/08/2023     Last metabolic panel Lab Results  Component Value Date   NA 141 05/07/2023   K 3.0 (L) 05/07/2023   CL 116 (H) 05/07/2023   CO2 18 (L) 05/07/2023   BUN 10 05/07/2023   CREATININE 0.65 05/07/2023   GLUCOSE 73 05/07/2023   GFRNONAA >60 05/07/2023   GFRAA >60 12/06/2016   CALCIUM 7.1 (L) 05/07/2023   PHOS 5.3 (H) 03/12/2023   PROT 6.2 (L) 04/04/2023   ALBUMIN 2.4 (L) 04/04/2023   BILITOT 0.3 04/04/2023   ALKPHOS 78 04/04/2023   AST 33 04/04/2023   ALT 35 04/04/2023   ANIONGAP 7 05/07/2023    CBG (last 3)  No results for input(s): "GLUCAP" in the last 72 hours.    Coagulation Profile: No results for input(s): "INR", "PROTIME" in the last 168 hours.   Radiology Studies: No results found.     Kathlen Mody M.D. [facility] 05/07/2023, 1:00 PM  Available via Epic secure chat 7am-7pm After 7 pm, please refer to night coverage provider listed on amion.

## 2023-05-07 NOTE — ED Notes (Addendum)
 Patient c/o increased pain. Provider notified and also asked if placement is ok for patient to be hooked up to suction at this time. No new orders at this time.

## 2023-05-07 NOTE — ED Notes (Signed)
 Patient self removes monitor devices at this time

## 2023-05-07 NOTE — ED Notes (Signed)
 Patient continues to remove monitor devices stating they make her itch.

## 2023-05-08 ENCOUNTER — Encounter: Attending: Registered Nurse | Admitting: Registered Nurse

## 2023-05-08 DIAGNOSIS — D649 Anemia, unspecified: Secondary | ICD-10-CM | POA: Diagnosis not present

## 2023-05-08 DIAGNOSIS — Z86711 Personal history of pulmonary embolism: Secondary | ICD-10-CM | POA: Diagnosis not present

## 2023-05-08 DIAGNOSIS — I63512 Cerebral infarction due to unspecified occlusion or stenosis of left middle cerebral artery: Secondary | ICD-10-CM | POA: Insufficient documentation

## 2023-05-08 DIAGNOSIS — D696 Thrombocytopenia, unspecified: Secondary | ICD-10-CM | POA: Insufficient documentation

## 2023-05-08 DIAGNOSIS — D6862 Lupus anticoagulant syndrome: Secondary | ICD-10-CM | POA: Diagnosis not present

## 2023-05-08 DIAGNOSIS — R4701 Aphasia: Secondary | ICD-10-CM | POA: Insufficient documentation

## 2023-05-08 LAB — CBC WITH DIFFERENTIAL/PLATELET
Abs Immature Granulocytes: 0.01 10*3/uL (ref 0.00–0.07)
Basophils Absolute: 0 10*3/uL (ref 0.0–0.1)
Basophils Relative: 1 %
Eosinophils Absolute: 0.1 10*3/uL (ref 0.0–0.5)
Eosinophils Relative: 1 %
HCT: 26.8 % — ABNORMAL LOW (ref 36.0–46.0)
Hemoglobin: 8.7 g/dL — ABNORMAL LOW (ref 12.0–15.0)
Immature Granulocytes: 0 %
Lymphocytes Relative: 34 %
Lymphs Abs: 1.5 10*3/uL (ref 0.7–4.0)
MCH: 27 pg (ref 26.0–34.0)
MCHC: 32.5 g/dL (ref 30.0–36.0)
MCV: 83.2 fL (ref 80.0–100.0)
Monocytes Absolute: 0.4 10*3/uL (ref 0.1–1.0)
Monocytes Relative: 8 %
Neutro Abs: 2.5 10*3/uL (ref 1.7–7.7)
Neutrophils Relative %: 56 %
Platelets: 35 10*3/uL — ABNORMAL LOW (ref 150–400)
RBC: 3.22 MIL/uL — ABNORMAL LOW (ref 3.87–5.11)
RDW: 14.7 % (ref 11.5–15.5)
WBC: 4.4 10*3/uL (ref 4.0–10.5)
nRBC: 0 % (ref 0.0–0.2)

## 2023-05-08 LAB — BASIC METABOLIC PANEL
Anion gap: 8 (ref 5–15)
BUN: 13 mg/dL (ref 6–20)
CO2: 22 mmol/L (ref 22–32)
Calcium: 8.6 mg/dL — ABNORMAL LOW (ref 8.9–10.3)
Chloride: 108 mmol/L (ref 98–111)
Creatinine, Ser: 1.03 mg/dL — ABNORMAL HIGH (ref 0.44–1.00)
GFR, Estimated: >60 mL/min (ref >60)
Glucose, Bld: 94 mg/dL (ref 70–99)
Potassium: 3.9 mmol/L (ref 3.5–5.1)
Sodium: 138 mmol/L (ref 135–145)

## 2023-05-08 LAB — TYPE AND SCREEN
ABO/RH(D): A POS
Antibody Screen: POSITIVE
DAT, IgG: POSITIVE
Donor AG Type: NEGATIVE
Unit division: 0

## 2023-05-08 LAB — HEPATITIS PANEL, ACUTE
HCV Ab: NONREACTIVE
Hep A IgM: NONREACTIVE
Hep B C IgM: NONREACTIVE
Hepatitis B Surface Ag: NONREACTIVE

## 2023-05-08 LAB — BPAM RBC
Blood Product Expiration Date: 202504052359
ISSUE DATE / TIME: 202503051834
Unit Type and Rh: 5100

## 2023-05-08 LAB — MAGNESIUM: Magnesium: 2.3 mg/dL (ref 1.7–2.4)

## 2023-05-08 MED ORDER — SODIUM CHLORIDE 0.9 % IV SOLN
510.0000 mg | Freq: Once | INTRAVENOUS | Status: AC
  Administered 2023-05-08: 510 mg via INTRAVENOUS
  Filled 2023-05-08: qty 17

## 2023-05-08 MED ORDER — LACTATED RINGERS IV SOLN: INTRAVENOUS | Status: DC

## 2023-05-08 NOTE — Progress Notes (Signed)
 [facility]                                                                               Jacqueline [name], is a 23 y.o. female, DOB - 2000/08/17, [id number] Admit date - 05/06/2023    Outpatient Primary MD for the patient is Jacqueline Arbour, MD  LOS - 1  days    Brief summary   Jacqueline [name] is a 23 y.o. 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  She was discharged from the hospital on 2/19.  Hospital course was also complicated by thrombocytopenia (etiology unclear) and menorrhagia for which she was seen by heme-onc and OB/GYN.  She was placed on Megace with ultrasound of the pelvis showing no definite abnormality.  She had a follow-up visit with her PCP today and was sent to the ED due to outpatient labs showing platelet count of 15k.  ED physician consulted Dr. Cherly Hensen with heme-onc who recommended giving 1 unit platelets now and trending CBC daily.  Recommended continuing to hold Eliquis if platelet count <50k or bleeding.  Since patient was found to have deficiency of B12, folate, and iron on previous labs, heme-onc recommended repeating testing and replacing as needed.  Anemia panel ordered.  [facility] called to admit.    Assessment & Plan    Assessment and Plan:  Thrombocytopenia:  Chronic, in the setting of lupus anticoagulant and PE and Eliquis use. No bleeding today.  Patient is on Eliquis and last dose was 3/3.   Heme onc consulted and recommended giving 1 unit of platelets and trending CBC daily.  Recommended holding Eliquis if platelet count <50k or bleeding.  Ferritin is 8, folate 25.1 and and vitamin B12 is 1198, iron level is 34.  S/p 1 unit of platelet transfusion. Platelets  slowly improving.      Hypokalemia Replaced. Keep K >4.    Hypomagnesemia Replaced.   Iron deficiency anemia S/p 1 unit of prbc transfusion.  Hemoglobin improved to 8.7.  IV iron infusion later today.     H/o Lupus anti coagulant, H/o PE, h/o recent large left MCA stroke Eliquis held due to thrombocytopenia.    Mood Disorder and Chronic pain syndrome:  Resume home meds.   H/o MCA stroke with residual right sided weakness.  Recently discharged on 2/29 from [facility].  No new complaints.    Estimated body mass index is 29.48 kg/m as calculated from the following:   Height as of this encounter: 5\' 4"  (1.626 m).   Weight as of this encounter: 77.9 kg.  Code Status: full code.  DVT Prophylaxis:  SCDs Start: 05/06/23 2211   Level of Care: Level of care: Med-Surg Family Communication: None at bedside.   Disposition Plan:     Remains inpatient appropriate:  pending clinical improvement.   Procedures:  None.   Consultants:   Hematology Oncology.   Antimicrobials:   Anti-infectives (From admission, onward)    None        Medications  Scheduled Meds:  sodium chloride   Intravenous Once   clonazePAM  0.5 mg Oral QHS   docusate sodium  100 mg Oral TID   FLUoxetine  10 mg Oral Daily   gabapentin  100 mg Oral TID   polyethylene glycol  17 g Oral BID   Continuous Infusions:  lactated ringers 75 mL/hr at 05/08/23 1033   PRN Meds:.acetaminophen **OR** acetaminophen, naLOXone (NARCAN)  injection, oxyCODONE    Subjective:   Jacqueline [name] was seen and examined today.  No new complaints today. No vaginal bleeding noted.   Objective:   Vitals:   05/08/23 0148 05/08/23 0545 05/08/23 0952 05/08/23 1305  BP: 108/66 (!) 92/58 106/68 109/80  Pulse: 91 76 84 85  Resp: 17 16 18 16   Temp: 98.3 F (36.8 C) (!) 97.5 F (36.4 C) 98 F (36.7 C) 98 F (36.7 C)  TempSrc: Oral Oral  Oral  SpO2: 100% 100% 91% 100%  Weight:      Height:        Intake/Output  Summary (Last 24 hours) at 05/08/2023 1325 Last data filed at 05/08/2023 0535 Gross per 24 hour  Intake 1049.84 ml  Output 200 ml  Net 849.84 ml   Filed Weights   05/07/23 1546  Weight: 77.9 kg     Exam General exam: Appears calm and comfortable  Respiratory system: Clear to auscultation. Respiratory effort normal. Cardiovascular system: S1 & S2 heard, RRR. Gastrointestinal system: Abdomen is nondistended, soft and nontender.  Central nervous system: Alert and oriented to person and place, residual right sided weakness.  Extremities: warm extremities.  Skin: No rashes, Psychiatry: flat affect,.     Data Reviewed:  I have personally reviewed following labs and imaging studies   CBC Lab Results  Component Value Date   WBC 4.4 05/08/2023   RBC 3.22 (L) 05/08/2023   HGB 8.7 (L) 05/08/2023   HCT 26.8 (L) 05/08/2023   MCV 83.2 05/08/2023   MCH 27.0 05/08/2023   PLT 35 (L) 05/08/2023   MCHC 32.5 05/08/2023   RDW 14.7 05/08/2023   LYMPHSABS 1.5 05/08/2023   MONOABS 0.4 05/08/2023   EOSABS 0.1 05/08/2023   BASOSABS 0.0 05/08/2023     Last metabolic panel Lab Results  Component Value Date   NA 138 05/08/2023   K 3.9 05/08/2023   CL 108 05/08/2023   CO2 22 05/08/2023   BUN 13 05/08/2023   CREATININE 1.03 (H) 05/08/2023   GLUCOSE 94 05/08/2023   GFRNONAA >60 05/08/2023   GFRAA >60 12/06/2016   CALCIUM 8.6 (L) 05/08/2023   PHOS 5.3 (H) 03/12/2023   PROT 6.2 (L) 04/04/2023   ALBUMIN 2.4 (L) 04/04/2023   BILITOT 0.3 04/04/2023   ALKPHOS 78 04/04/2023   AST 33 04/04/2023   ALT 35 04/04/2023   ANIONGAP 8 05/08/2023    CBG (last 3)  No results for input(s): "GLUCAP" in the last 72 hours.    Coagulation Profile: No results for input(s): "INR", "PROTIME" in the last 168 hours.   Radiology Studies: No results found.     Kathlen Mody M.D. [facility] 05/08/2023, 1:25 PM  Available via Epic secure chat 7am-7pm After 7 pm, please refer to night  coverage provider listed on amion.

## 2023-05-08 NOTE — Consult Note (Addendum)
 [facility] Hematology and oncology consult note   Patient Care Team: [name], MD as PCP - General (Pediatrics)  Addendum I personally seen and evaluated patient and agree with the assessment and plan below.  1 dose of IV Feraheme ordered Hepatitis panel Monitor daily CBC Given history of severe B12 and folate deficiency, please continue supplements daily. Holding anticoagulation until platelets >50K Transfuse platelet if bleeding, or <10K  Will communicate with primary hematologist.  ASSESSMENT & PLAN:  Acute on chronic thrombocytopenia Previously low folate and B12 now normal.   Negative HIV.  Normal INR and LFT Currently with menorrhalgia from anticoagulation with consumptive etiology Will check hepatitis C Hematology/Dr. Cherly Hensen following  IDA heavy menorrhagia Received 2 units of blood here Will order 1 dose of IV iron Continue to monitor CBC with differential closely  Positive lupus anticoagulant History of unprovoked PE Anticardiolipin and beta-2 glycoprotein IgG and IgM not meeting criteria for APS.   Lupus anticoagulant was identified once in 2024 Status post Eliquis which was stopped in June 2024.  However was restarted due to recent stroke.  Last dose 05/05/2023.  Hold Eliquis at this time due to low platelets. Patient has seen Dr. [name] before in 2024.  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     Continue supportive care     All questions were answered. The patient knows to call the clinic with any problems, questions or concerns. The total time spent in the appointment was 60 minutes encounter with patients including review of chart and various tests results, discussions about plan of care and coordination of care plan   Melven Sartorius, MD 05/08/2023 1:24  PM   CHIEF COMPLAINTS/PURPOSE OF ADMISSION Thrombocytopenia  HISTORY OF PRESENTING ILLNESS:  Jacqueline [name] 23 y.o. female who came to the ED on 05/06/2023 with complaints of low platelets on outpatient labs done by patient's primary.  Platelet counts in outpatient labs was 15K.  Patient also has history of iron, folate, and B12 deficiencies. Therefore hematology consult has been requested.  Patient is seen and assessed today awake and alert laying supine in bed.  Right-sided weakness also noted with expressive aphasia noted.  Patient called her sister Jacqueline [name] on the phone who provided the majority of patient's medical history along with documentation review in her chart.   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  Patient was recently discharged on 04/23/2023.  Apparently hospital course was complicated by thrombocytopenia and menorrhagia. Medical history also significant for PE, positive lupus anticoagulant on Eliquis. Social history significant for tobacco use.   Summary of oncologic history as follows: Oncology History   No history exists.    MEDICAL HISTORY:  Past Medical History:  Diagnosis Date   Bacterial vaginitis 07/10/2016   Candida vaginitis 07/24/2016   Headache in pregnancy, antepartum, third trimester 07/24/2016   Kell isoimmunization during pregnancy     SURGICAL HISTORY: Past Surgical History:  Procedure Laterality Date   CESAREAN SECTION N/A 09/04/2016   Procedure: CESAREAN SECTION;  Surgeon: Levie Heritage, DO;  Location: [facility];  Service: Obstetrics;  Laterality: N/A;   CESAREAN SECTION Bilateral    IR CT HEAD LTD  02/21/2023   IR GASTROSTOMY TUBE MOD SED  03/21/2023   IR PERCUTANEOUS ART THROMBECTOMY/INFUSION INTRACRANIAL INC DIAG ANGIO  02/21/2023  IR REPLC GASTRO/COLONIC TUBE PERCUT W/FLUORO  03/27/2023   RADIOLOGY WITH ANESTHESIA N/A 02/21/2023   Procedure: IR WITH ANESTHESIA;  Surgeon:  Julieanne Cotton, MD;  Location: [facility] OR;  Service: Radiology;  Laterality: N/A;    SOCIAL HISTORY: Social History   Socioeconomic History   Marital status: Single    Spouse name: Not on file   Number of children: Not on file   Years of education: Not on file   Highest education level: Not on file  Occupational History   Not on file  Tobacco Use   Smoking status: Some Days    Types: Cigars   Smokeless tobacco: Never   Tobacco comments:    1 B&M per day  Vaping Use   Vaping status: Never Used  Substance and Sexual Activity   Alcohol use: Yes   Drug use: No   Sexual activity: Yes    Partners: Male    Birth control/protection: None  Other Topics Concern   Not on file  Social History Narrative   [phone]- patient's confidential cell   Social Drivers of Health   Financial Resource Strain: Not on File (06/21/2021)   Received from [employer], [employer]    Financial Resource Strain: 0  Food Insecurity: No Food Insecurity (05/07/2023)   Hunger Vital Sign    Worried About Running Out of Food in the Last Year: Never true    Ran Out of Food in the Last Year: Never true  Transportation Needs: No Transportation Needs (05/07/2023)   PRAPARE - Administrator, Civil Service (Medical): No    Lack of Transportation (Non-Medical): No  Physical Activity: Not on File (06/21/2021)   Received from [place], [place]   Physical Activity    Physical Activity: 0  Stress: Not on File (06/21/2021)   Received from [facility], [place]   Stress    Stress: 0  Social Connections: Not on File (11/16/2022)   Received from [facility]   Social Connections    Connectedness: 0  Intimate Partner Violence: Patient Declined (05/07/2023)   Humiliation, Afraid, Rape, and Kick questionnaire    Fear of Current or Ex-Partner: Patient declined    Emotionally Abused: Patient declined    Physically Abused: Patient declined    Sexually Abused: Patient declined    FAMILY HISTORY: Family History   Problem Relation Age of Onset   Hypertension Maternal Grandmother    Diabetes Maternal Grandmother    Cancer Neg Hx     ALLERGIES:  has no known allergies.  MEDICATIONS:  Current Facility-Administered Medications  Medication Dose Route Frequency Provider Last Rate Last Admin   0.9 %  sodium chloride infusion (Manually program via Guardrails IV Fluids)   Intravenous Once John Giovanni, MD   Stopped at 05/07/23 0249   acetaminophen (TYLENOL) tablet 650 mg  650 mg Oral Q6H PRN John Giovanni, MD   650 mg at 05/08/23 0149   Or   acetaminophen (TYLENOL) suppository 650 mg  650 mg Rectal Q6H PRN John Giovanni, MD       clonazePAM Scarlette Calico) tablet 0.5 mg  0.5 mg Oral QHS John Giovanni, MD   0.5 mg at 05/07/23 2144   docusate sodium (COLACE) capsule 100 mg  100 mg Oral TID John Giovanni, MD   100 mg at 05/07/23 2144   FLUoxetine (PROZAC) capsule 10 mg  10 mg Oral Daily John Giovanni, MD   10 mg at 05/08/23 1023   gabapentin (NEURONTIN) capsule 100 mg  100 mg  Oral TID John Giovanni, MD   100 mg at 05/08/23 1023   lactated ringers infusion   Intravenous Continuous Kathlen Mody, MD 75 mL/hr at 05/08/23 1033 New Bag at 05/08/23 1033   naloxone (NARCAN) injection 0.4 mg  0.4 mg Intravenous PRN John Giovanni, MD       oxyCODONE (Oxy IR/ROXICODONE) immediate release tablet 5 mg  5 mg Oral Q6H PRN John Giovanni, MD   5 mg at 05/08/23 1307   polyethylene glycol (MIRALAX / GLYCOLAX) packet 17 g  17 g Oral BID John Giovanni, MD        REVIEW OF SYSTEMS:   Constitutional: Denies fevers, weight loss or abnormal night sweats Eyes: Denies visual change Ears, nose, mouth, throat, and face: Denies sore throat or enlarged tongue Respiratory: Denies cough, shortness of breath or wheezes Cardiovascular: Denies palpitation, chest discomfort or chest pain Gastrointestinal:  Denies nausea, vomiting, diarrhea, constipation, heartburn or abdominal pain GU: Denies  any hesitancy, dysuria, frequency, hematuria Skin: Denies abnormal skin rashes Lymphatics: Denies new lymphadenopathy or mass Neurological: Denies numbness, tingling or new weaknesses All other systems were reviewed with the patient and are negative.  PHYSICAL EXAMINATION: ECOG PERFORMANCE STATUS: 3 - Symptomatic, >50% confined to bed  Vitals:   05/08/23 0952 05/08/23 1305  BP: 106/68 109/80  Pulse: 84 85  Resp: 18 16  Temp: 98 F (36.7 C) 98 F (36.7 C)  SpO2: 91% 100%   Filed Weights   05/07/23 1546  Weight: 171 lb 11.8 oz (77.9 kg)    GENERAL:alert, no distress and comfortable SKIN: skin color normal. No jaundice EYES: normal, conjunctiva normal, sclera clear OROPHARYNX: no exudate, moist NECK: supple. No mass LYMPH:  no palpable cervical, axillary or inguinal lymphadenopathy LUNGS: clear to auscultation and normal breathing effort.  No wheeze or rales HEART: regular rate & rhythm and no murmurs ABDOMEN:abdomen soft, non-tender and normal bowel sounds Musculoskeletal:  no lower extremity edema NEURO: alert & oriented x 3 with fluent speech; no focal motor/sensory deficits Strength and sensation equal bilaterally  LABORATORY DATA:  I have reviewed the data as listed Lab Results  Component Value Date   WBC 4.4 05/08/2023   HGB 8.7 (L) 05/08/2023   HCT 26.8 (L) 05/08/2023   MCV 83.2 05/08/2023   PLT 35 (L) 05/08/2023   Recent Labs    03/26/23 0210 03/27/23 1404 03/28/23 1311 03/29/23 0640 04/04/23 2216 04/07/23 0537 04/21/23 1640 05/07/23 0834 05/08/23 0315  NA 129*   < > 140   < > 137   < > 138 141 138  K 3.7   < > 3.3*   < > 3.3*   < > 3.4* 3.0* 3.9  CL 99   < > 108   < > 107   < > 108 116* 108  CO2 21*   < > 23   < > 20*   < > 21* 18* 22  GLUCOSE 114*   < > 92   < > 110*   < > 80 73 94  BUN 13   < > 8   < > 13   < > 11 10 13   CREATININE 0.73   < > 0.79   < > 0.88   < > 0.83 0.65 1.03*  CALCIUM 8.7*   < > 8.8*   < > 8.1*   < > 8.6* 7.1* 8.6*   GFRNONAA >60   < > >60   < > >60   < > >60 >60 >  60  PROT 7.3  --  6.7  --  6.2*  --   --   --   --   ALBUMIN 2.7*  --  2.3*  --  2.4*  --   --   --   --   AST 44*  --  65*  --  33  --   --   --   --   ALT 58*  --  92*  --  35  --   --   --   --   ALKPHOS 97  --  91  --  78  --   --   --   --   BILITOT 0.4  --  0.6  --  0.3  --   --   --   --    < > = values in this interval not displayed.    RADIOGRAPHIC STUDIES: I have personally reviewed the radiological images as listed and agreed with the findings in the report. MR BRAIN WO CONTRAST Result Date: 04/18/2023 CLINICAL DATA:  Neuro deficit, acute, stroke suspected. EXAM: MRI HEAD WITHOUT CONTRAST TECHNIQUE: Multiplanar, multiecho pulse sequences of the brain and surrounding structures were obtained without intravenous contrast. COMPARISON:  MRI of the brain February 27, 2023. FINDINGS: Brain: Large area of encephalomalacia and gliosis in the left frontotemporal parietal and insular region consistent with known prior left MCA territory infarct. Within that area, multiple small focal areas of restricted diffusion are seen, suggestive acute on chronic process. Other areas of encephalomalacia and gliosis are seen in the left frontal lobe as well as in the left caudate and right ACA/MCA watershed region. Hemosiderin deposit is seen in left temporal lobe. No hydrocephalus, extra-axial collection mass lesion. Vascular: Normal flow voids. Skull and upper cervical spine: Diffuse decrease of the T1 signal of the visualized osseous structures, may represent red marrow reconversion Sinuses/Orbits: Trace mucosal thickening of paranasal sinuses and mastoids. The orbits are maintained. Other: None. IMPRESSION: 1. Large area of encephalomalacia and gliosis in the left frontotemporal parietal and insular region consistent with known prior left MCA territory infarct. Within that area, multiple small focal areas of restricted diffusion are seen, suggestive of acute on  chronic process. 2. Other areas of encephalomalacia and gliosis in the left frontal lobe as well as in the left caudate and right ACA/MCA watershed region. Electronically Signed   By: Baldemar Lenis M.D.   On: 04/18/2023 12:52   CT HEAD CODE STROKE WO CONTRAST` Result Date: 04/18/2023 CLINICAL DATA:  Code stroke.  Diplopia EXAM: CT HEAD WITHOUT CONTRAST TECHNIQUE: Contiguous axial images were obtained from the base of the skull through the vertex without intravenous contrast. RADIATION DOSE REDUCTION: This exam was performed according to the departmental dose-optimization program which includes automated exposure control, adjustment of the mA and/or kV according to patient size and/or use of iterative reconstruction technique. COMPARISON:  Head CT 03/03/2023 FINDINGS: Brain: Evolving left MCA territory infarct, not significantly changed from prior exam. Negative for an acute infarct. No mass effect. No mass lesion. No CT evidence of an acute cortical infarct Vascular: No hyperdense vessel or unexpected calcification. Skull: Normal. Negative for fracture or focal lesion. Sinuses/Orbits: No middle ear or mastoid effusion. Paranasal sinuses are clear. Orbits are unremarkable. Other: None. ASPECTS [facility] Stroke Program Early CT Score): 10 when accounting for chronic findings IMPRESSION: 1. No hemorrhage or CT evidence of an acute cortical infarct. 2. Evolving left MCA territory infarct, not significantly changed from prior exam.  Findings were paged to Dr. Derry Lory on 04/18/23 at 11:43 AM. Electronically Signed   By: Lorenza Cambridge M.D.   On: 04/18/2023 11:43   DG Abd 1 View Result Date: 04/16/2023 CLINICAL DATA:  Midline abdominal pain EXAM: ABDOMEN - 1 VIEW COMPARISON:  03/29/2023 FINDINGS: Gastric tube again noted projecting over the left abdomen. Nonobstructive bowel gas pattern. No organomegaly or free air. Moderate stool burden. Visualized lung bases clear. IMPRESSION: No bowel obstruction or  free air. Moderate stool burden. Electronically Signed   By: Charlett Nose M.D.   On: 04/16/2023 18:29

## 2023-05-09 DIAGNOSIS — D6862 Lupus anticoagulant syndrome: Secondary | ICD-10-CM | POA: Diagnosis not present

## 2023-05-09 DIAGNOSIS — Z86711 Personal history of pulmonary embolism: Secondary | ICD-10-CM | POA: Diagnosis not present

## 2023-05-09 DIAGNOSIS — Z8673 Personal history of transient ischemic attack (TIA), and cerebral infarction without residual deficits: Secondary | ICD-10-CM | POA: Diagnosis not present

## 2023-05-09 DIAGNOSIS — D696 Thrombocytopenia, unspecified: Secondary | ICD-10-CM | POA: Diagnosis not present

## 2023-05-09 DIAGNOSIS — D500 Iron deficiency anemia secondary to blood loss (chronic): Secondary | ICD-10-CM

## 2023-05-09 LAB — BASIC METABOLIC PANEL
Anion gap: 8 (ref 5–15)
BUN: 15 mg/dL (ref 6–20)
CO2: 21 mmol/L — ABNORMAL LOW (ref 22–32)
Calcium: 8.4 mg/dL — ABNORMAL LOW (ref 8.9–10.3)
Chloride: 104 mmol/L (ref 98–111)
Creatinine, Ser: 0.83 mg/dL (ref 0.44–1.00)
GFR, Estimated: >60 mL/min (ref >60)
Glucose, Bld: 90 mg/dL (ref 70–99)
Potassium: 3.6 mmol/L (ref 3.5–5.1)
Sodium: 133 mmol/L — ABNORMAL LOW (ref 135–145)

## 2023-05-09 LAB — CBC WITH DIFFERENTIAL/PLATELET
Abs Immature Granulocytes: 0.01 10*3/uL (ref 0.00–0.07)
Basophils Absolute: 0 10*3/uL (ref 0.0–0.1)
Basophils Relative: 0 %
Eosinophils Absolute: 0.1 10*3/uL (ref 0.0–0.5)
Eosinophils Relative: 2 %
HCT: 25.4 % — ABNORMAL LOW (ref 36.0–46.0)
Hemoglobin: 8.3 g/dL — ABNORMAL LOW (ref 12.0–15.0)
Immature Granulocytes: 0 %
Lymphocytes Relative: 37 %
Lymphs Abs: 1.7 10*3/uL (ref 0.7–4.0)
MCH: 26.9 pg (ref 26.0–34.0)
MCHC: 32.7 g/dL (ref 30.0–36.0)
MCV: 82.5 fL (ref 80.0–100.0)
Monocytes Absolute: 0.4 10*3/uL (ref 0.1–1.0)
Monocytes Relative: 8 %
Neutro Abs: 2.4 10*3/uL (ref 1.7–7.7)
Neutrophils Relative %: 53 %
Platelets: 30 10*3/uL — ABNORMAL LOW (ref 150–400)
RBC: 3.08 MIL/uL — ABNORMAL LOW (ref 3.87–5.11)
RDW: 14.6 % (ref 11.5–15.5)
Smear Review: NORMAL
WBC: 4.5 10*3/uL (ref 4.0–10.5)
nRBC: 0 % (ref 0.0–0.2)

## 2023-05-09 MED ORDER — MIDODRINE HCL 2.5 MG PO TABS
2.5000 mg | ORAL_TABLET | Freq: Three times a day (TID) | ORAL | Status: DC
  Administered 2023-05-09 – 2023-05-15 (×15): 2.5 mg via ORAL
  Filled 2023-05-09 (×21): qty 1

## 2023-05-09 NOTE — Progress Notes (Signed)
 At bedside for PIV insertion. Pt refused IV stick at this time. Pt has no IV medications ordered. IVF ordered, however, pt states she is eating and drinking. Right arm restriction with multiple PIV infiltrations. RN and MD aware.

## 2023-05-09 NOTE — Progress Notes (Signed)
 Patient with no IV access at this time due to limb restrictions. IV Nurse called and unable to start IV due to previous location/edema and limited sites. On-Call NP Virgel Manifold) made aware/acknowledged with the following order: Will need IV if she needs more blood or IV iron. Will make Incoming Nurse aware.

## 2023-05-09 NOTE — Progress Notes (Deleted)
   05/09/23 1323  TOC Brief Assessment  Insurance and Status Reviewed  Patient has primary care physician Yes  Home environment has been reviewed home with spouse  Prior level of function: independent  Prior/Current Home Services No current home services  Social Drivers of Health Review SDOH reviewed no interventions necessary  Readmission risk has been reviewed Yes  Transition of care needs no transition of care needs at this time

## 2023-05-09 NOTE — Progress Notes (Signed)
 [facility]                                                                               Jacqueline [name], is a 23 y.o. female, DOB - 11/04/2000, ZOX:[id number] Admit date - 05/06/2023    Outpatient Primary MD for the patient is [name], MD  LOS - 2  days    Brief summary   Jacqueline [name] is a 23 y.o. 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  She was discharged from the hospital on 2/19.  Hospital course was also complicated by thrombocytopenia (etiology unclear) and menorrhagia for which she was seen by heme-onc and OB/GYN.  She was placed on Megace with ultrasound of the pelvis showing no definite abnormality.  She had a follow-up visit with her PCP today and was sent to the ED due to outpatient labs showing platelet count of 15k.  ED physician consulted Dr. Cherly Hensen with heme-onc who recommended giving 1 unit platelets now and trending CBC daily.  Recommended continuing to hold Eliquis if platelet count <50k or bleeding.  Since patient was found to have deficiency of B12, folate, and iron on previous labs, heme-onc recommended repeating testing and replacing as needed.  Anemia panel ordered.  [facility] called to admit.    Assessment & Plan    Assessment and Plan:  Thrombocytopenia:  Chronic, in the setting of lupus anticoagulant and PE and Eliquis use. No bleeding today.  Patient is on Eliquis and last dose was 3/3.   Heme onc consulted and recommended giving 1 unit of platelets and trending CBC daily.  Recommended holding Eliquis if platelet count <50k or bleeding.  Ferritin is 8, folate 25.1 and and vitamin B12 is 1198, iron level is 34.  S/p 1 unit of platelet transfusion. Platelets  slowly improving. But not back to baseline.      Hypokalemia Replaced. Keep K >4.    Hypomagnesemia Replaced. Repeat level wnl.   Iron deficiency anemia S/p 1 unit of prbc transfusion.  Hemoglobin improved to 8.7.  IV iron infusion ordered by oncology.     H/o Lupus anti coagulant, H/o PE, h/o recent large left MCA stroke Eliquis held due to thrombocytopenia.    Mood Disorder and Chronic pain syndrome:  Resume home meds.   H/o MCA stroke with residual right sided weakness.  Recently discharged on 2/29 from [facility].  No new complaints.    Estimated body mass index is 29.48 kg/m as calculated from the following:   Height as of this encounter: 5\' 4"  (1.626 m).   Weight as of this encounter: 77.9 kg.  Code Status: full code.  DVT Prophylaxis:  SCDs Start: 05/06/23 2211   Level of Care: Level of care: Med-Surg Family Communication: None at bedside.   Disposition Plan:     Remains inpatient appropriate:  pending clinical improvement.   Procedures:  None.   Consultants:   Hematology Oncology.   Antimicrobials:   Anti-infectives (From admission, onward)  None        Medications  Scheduled Meds:  sodium chloride   Intravenous Once   clonazePAM  0.5 mg Oral QHS   docusate sodium  100 mg Oral TID   FLUoxetine  10 mg Oral Daily   gabapentin  100 mg Oral TID   midodrine  2.5 mg Oral TID WC   polyethylene glycol  17 g Oral BID   Continuous Infusions:  lactated ringers 75 mL/hr at 05/08/23 1033   PRN Meds:.acetaminophen **OR** acetaminophen, naLOXone (NARCAN)  injection, oxyCODONE    Subjective:   Jacqueline [name] was seen and examined today.  Pt refusing the IV placement.   Objective:   Vitals:   05/08/23 2159 05/09/23 0506 05/09/23 0924 05/09/23 1333  BP: 93/74 (!) 98/54 104/60 (!) 98/57  Pulse: 78 78 71 77  Resp: 16 16  16   Temp: 98.8 F (37.1 C) 98.8 F (37.1 C)  98.7 F (37.1 C)  TempSrc:      SpO2: 100% 100%  100%  Weight:       Height:        Intake/Output Summary (Last 24 hours) at 05/09/2023 1858 Last data filed at 05/09/2023 0900 Gross per 24 hour  Intake 857.07 ml  Output 200 ml  Net 657.07 ml   Filed Weights   05/07/23 1546  Weight: 77.9 kg     Exam General exam: Appears calm and comfortable  Respiratory system: Clear to auscultation. Respiratory effort normal. Cardiovascular system: S1 & S2 heard, RRR. Gastrointestinal system: Abdomen is nondistended, soft and nontender. Central nervous system: Alert and oriented.  Right sided weakness, some expressive aphasia.  Extremities: Symmetric 5 x 5 power. Skin: No rashes, l Psychiatry: mood is appropriate.      Data Reviewed:  I have personally reviewed following labs and imaging studies   CBC Lab Results  Component Value Date   WBC 4.5 05/09/2023   RBC 3.08 (L) 05/09/2023   HGB 8.3 (L) 05/09/2023   HCT 25.4 (L) 05/09/2023   MCV 82.5 05/09/2023   MCH 26.9 05/09/2023   PLT 30 (L) 05/09/2023   MCHC 32.7 05/09/2023   RDW 14.6 05/09/2023   LYMPHSABS 1.7 05/09/2023   MONOABS 0.4 05/09/2023   EOSABS 0.1 05/09/2023   BASOSABS 0.0 05/09/2023     Last metabolic panel Lab Results  Component Value Date   NA 133 (L) 05/09/2023   K 3.6 05/09/2023   CL 104 05/09/2023   CO2 21 (L) 05/09/2023   BUN 15 05/09/2023   CREATININE 0.83 05/09/2023   GLUCOSE 90 05/09/2023   GFRNONAA >60 05/09/2023   GFRAA >60 12/06/2016   CALCIUM 8.4 (L) 05/09/2023   PHOS 5.3 (H) 03/12/2023   PROT 6.2 (L) 04/04/2023   ALBUMIN 2.4 (L) 04/04/2023   BILITOT 0.3 04/04/2023   ALKPHOS 78 04/04/2023   AST 33 04/04/2023   ALT 35 04/04/2023   ANIONGAP 8 05/09/2023    CBG (last 3)  No results for input(s): "GLUCAP" in the last 72 hours.    Coagulation Profile: No results for input(s): "INR", "PROTIME" in the last 168 hours.   Radiology Studies: No results found.     Kathlen Mody M.D. [facility] 05/09/2023, 6:58 PM  Available via Epic secure chat  7am-7pm After 7 pm, please refer to night coverage provider listed on amion.

## 2023-05-10 DIAGNOSIS — D6862 Lupus anticoagulant syndrome: Secondary | ICD-10-CM | POA: Diagnosis not present

## 2023-05-10 DIAGNOSIS — Z86711 Personal history of pulmonary embolism: Secondary | ICD-10-CM | POA: Diagnosis not present

## 2023-05-10 DIAGNOSIS — Z8673 Personal history of transient ischemic attack (TIA), and cerebral infarction without residual deficits: Secondary | ICD-10-CM | POA: Diagnosis not present

## 2023-05-10 DIAGNOSIS — D696 Thrombocytopenia, unspecified: Secondary | ICD-10-CM | POA: Diagnosis not present

## 2023-05-10 LAB — CBC WITH DIFFERENTIAL/PLATELET
Abs Immature Granulocytes: 0.01 10*3/uL (ref 0.00–0.07)
Basophils Absolute: 0 10*3/uL (ref 0.0–0.1)
Basophils Relative: 0 %
Eosinophils Absolute: 0.1 10*3/uL (ref 0.0–0.5)
Eosinophils Relative: 1 %
HCT: 27.4 % — ABNORMAL LOW (ref 36.0–46.0)
Hemoglobin: 8.9 g/dL — ABNORMAL LOW (ref 12.0–15.0)
Immature Granulocytes: 0 %
Lymphocytes Relative: 39 %
Lymphs Abs: 1.9 10*3/uL (ref 0.7–4.0)
MCH: 26.6 pg (ref 26.0–34.0)
MCHC: 32.5 g/dL (ref 30.0–36.0)
MCV: 82 fL (ref 80.0–100.0)
Monocytes Absolute: 0.4 10*3/uL (ref 0.1–1.0)
Monocytes Relative: 8 %
Neutro Abs: 2.5 10*3/uL (ref 1.7–7.7)
Neutrophils Relative %: 52 %
Platelets: 28 10*3/uL — CL (ref 150–400)
RBC: 3.34 MIL/uL — ABNORMAL LOW (ref 3.87–5.11)
RDW: 14.9 % (ref 11.5–15.5)
WBC: 4.8 10*3/uL (ref 4.0–10.5)
nRBC: 0 % (ref 0.0–0.2)

## 2023-05-10 LAB — LUPUS ANTICOAGULANT PANEL
DRVVT: 76.6 s — ABNORMAL HIGH (ref 0.0–47.0)
PTT Lupus Anticoagulant: 107.1 s — ABNORMAL HIGH (ref 0.0–43.5)

## 2023-05-10 LAB — HEXAGONAL PHASE PHOSPHOLIPID: Hexagonal Phase Phospholipid: 123 s — ABNORMAL HIGH (ref 0–11)

## 2023-05-10 LAB — DRVVT CONFIRM: dRVVT Confirm: 2 ratio — ABNORMAL HIGH (ref 0.8–1.2)

## 2023-05-10 LAB — PTT-LA MIX: PTT-LA Mix: 92.8 s — ABNORMAL HIGH (ref 0.0–40.5)

## 2023-05-10 LAB — DRVVT MIX: dRVVT Mix: 82 s — ABNORMAL HIGH (ref 0.0–40.4)

## 2023-05-10 MED ORDER — POLYSACCHARIDE IRON COMPLEX 150 MG PO CAPS
150.0000 mg | ORAL_CAPSULE | Freq: Every day | ORAL | Status: DC
  Administered 2023-05-10 – 2023-05-15 (×6): 150 mg via ORAL
  Filled 2023-05-10 (×6): qty 1

## 2023-05-10 MED ORDER — ADULT MULTIVITAMIN W/MINERALS CH
1.0000 | ORAL_TABLET | Freq: Every day | ORAL | Status: DC
  Administered 2023-05-10 – 2023-05-15 (×6): 1 via ORAL
  Filled 2023-05-10 (×6): qty 1

## 2023-05-10 MED ORDER — FOLIC ACID 1 MG PO TABS
1.0000 mg | ORAL_TABLET | Freq: Every day | ORAL | Status: DC
  Administered 2023-05-11 – 2023-05-15 (×5): 1 mg via ORAL
  Filled 2023-05-10 (×5): qty 1

## 2023-05-10 MED ORDER — VITAMIN B-12 1000 MCG PO TABS: 2000.0000 ug | ORAL_TABLET | Freq: Every day | ORAL | Status: DC

## 2023-05-10 NOTE — Plan of Care (Signed)
  Problem: Education: Goal: Knowledge of General Education information will improve Description: Including pain rating scale, medication(s)/side effects and non-pharmacologic comfort measures Outcome: Progressing   Problem: Nutrition: Goal: Adequate nutrition will be maintained Outcome: Progressing   Problem: Elimination: Goal: Will not experience complications related to urinary retention Outcome: Progressing   Problem: Pain Managment: Goal: General experience of comfort will improve and/or be controlled Outcome: Progressing

## 2023-05-10 NOTE — Progress Notes (Signed)
 [facility]                                                                               Jacqueline [name], is a 23 y.o. female, DOB - 10/05/2000, ZOX:[id number] Admit date - 05/06/2023    Outpatient Primary MD for the patient is Jacqueline Arbour, MD  LOS - 3  days    Brief summary   Jacqueline [name] is a 23 y.o. 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  She was discharged from the hospital on 2/19.  Hospital course was also complicated by thrombocytopenia (etiology unclear) and menorrhagia for which she was seen by heme-onc and OB/GYN.  She was placed on Megace with ultrasound of the pelvis showing no definite abnormality.  She had a follow-up visit with her PCP today and was sent to the ED due to outpatient labs showing platelet count of 15k.  ED physician consulted Dr. Cherly [name] with heme-onc who recommended giving 1 unit platelets now and trending CBC daily.  Recommended continuing to hold Eliquis if platelet count <50k or bleeding.  Since patient was found to have deficiency of B12, folate, and iron on previous labs, heme-onc recommended repeating testing and replacing as needed.  Anemia panel ordered.  [facility] called to admit.    Assessment & Plan    Assessment and Plan:  Thrombocytopenia:  Chronic, in the setting of lupus anticoagulant and PE and Eliquis use. No bleeding today.  Patient is on Eliquis and last dose was 3/3.   Heme onc consulted and recommended giving 1 unit of platelets and trending CBC daily.  Recommended holding Eliquis if platelet count <50k or bleeding.  Ferritin is 8, folate 25.1 and and vitamin B12 is 1198, iron level is 34.  S/p 1 unit of platelet transfusion. Platelets  s till low at 28,000. No bleeding seen.    Oncology recommends to continue with folic and iron supplementation.    Hypokalemia Replaced. Keep K >4.    Hypomagnesemia Replaced. Repeat level wnl.   Iron deficiency anemia S/p 1 unit of prbc transfusion.  Hemoglobin improved to 8.7.  IV iron infusion ordered by oncology.     H/o Lupus anti coagulant, H/o PE, h/o recent large left MCA stroke Eliquis held due to thrombocytopenia.    Mood Disorder and Chronic pain syndrome:  Resume home meds.   H/o MCA stroke with residual right sided weakness.  Recently discharged on 2/29 from [facility].  No new complaints.   Constipation Resume colace.   H/o Tobacco abuse; Counseling given.   Mild hyponatremia Monitor.      Estimated body mass index is 29.48 kg/m as calculated from the following:   Height as of this encounter: 5\' 4"  (1.626 m).   Weight as of this encounter: 77.9 kg.  Code Status: full code.  DVT Prophylaxis:  SCDs Start: 05/06/23 2211   Level of Care: Level of care: Med-Surg Family Communication: None at bedside.   Disposition Plan:     Remains  inpatient appropriate:  pending clinical improvement.   Procedures:  None.   Consultants:   Hematology Oncology.   Antimicrobials:   Anti-infectives (From admission, onward)    None        Medications  Scheduled Meds:  sodium chloride   Intravenous Once   clonazePAM  0.5 mg Oral QHS   docusate sodium  100 mg Oral TID   FLUoxetine  10 mg Oral Daily   [START ON 05/11/2023] folic acid  1 mg Oral Daily   gabapentin  100 mg Oral TID   iron polysaccharides  150 mg Oral Daily   midodrine  2.5 mg Oral TID WC   multivitamin with minerals  1 tablet Oral Daily   polyethylene glycol  17 g Oral BID   Continuous Infusions:  lactated ringers 75 mL/hr at 05/08/23 1033   PRN Meds:.acetaminophen **OR** acetaminophen, naLOXone (NARCAN)  injection, oxyCODONE    Subjective:   Jacqueline [name] was seen and examined  today.  No new complaints today.   Objective:   Vitals:   05/09/23 1333 05/09/23 2132 05/10/23 0538 05/10/23 1421  BP: (!) 98/57 102/65 (!) 106/58 (!) 103/56  Pulse: 77 90 82 87  Resp: 16 17 18 17   Temp: 98.7 F (37.1 C) 98.2 F (36.8 C) 98 F (36.7 C) 97.8 F (36.6 C)  TempSrc:  Oral    SpO2: 100% 100% 98% 100%  Weight:      Height:        Intake/Output Summary (Last 24 hours) at 05/10/2023 1816 Last data filed at 05/10/2023 1755 Gross per 24 hour  Intake 1190 ml  Output 1600 ml  Net -410 ml   Filed Weights   05/07/23 1546  Weight: 77.9 kg     Exam General exam: Appears calm and comfortable  Respiratory system: Clear to auscultation. Respiratory effort normal. Cardiovascular system: S1 & S2 heard, RRR. No JVD, Gastrointestinal system: Abdomen is nondistended, soft and nontender.  Central nervous system: Alert and oriented to place and person, with right sided hemiparesis  and some expressive aphasia.  Extremities: Symmetric 5 x 5 power. Skin: No rashes,  Psychiatry:  Mood & affect appropriate.       Data Reviewed:  I have personally reviewed following labs and imaging studies   CBC Lab Results  Component Value Date   WBC 4.8 05/10/2023   RBC 3.34 (L) 05/10/2023   HGB 8.9 (L) 05/10/2023   HCT 27.4 (L) 05/10/2023   MCV 82.0 05/10/2023   MCH 26.6 05/10/2023   PLT 28 (LL) 05/10/2023   MCHC 32.5 05/10/2023   RDW 14.9 05/10/2023   LYMPHSABS 1.9 05/10/2023   MONOABS 0.4 05/10/2023   EOSABS 0.1 05/10/2023   BASOSABS 0.0 05/10/2023     Last metabolic panel Lab Results  Component Value Date   NA 133 (L) 05/09/2023   K 3.6 05/09/2023   CL 104 05/09/2023   CO2 21 (L) 05/09/2023   BUN 15 05/09/2023   CREATININE 0.83 05/09/2023   GLUCOSE 90 05/09/2023   GFRNONAA >60 05/09/2023   GFRAA >60 12/06/2016   CALCIUM 8.4 (L) 05/09/2023   PHOS 5.3 (H) 03/12/2023   PROT 6.2 (L) 04/04/2023   ALBUMIN 2.4 (L) 04/04/2023   BILITOT 0.3 04/04/2023   ALKPHOS 78  04/04/2023   AST 33 04/04/2023   ALT 35 04/04/2023   ANIONGAP 8 05/09/2023    CBG (last 3)  No results for input(s): "GLUCAP" in the last 72 hours.    Coagulation Profile: No  results for input(s): "INR", "PROTIME" in the last 168 hours.   Radiology Studies: No results found.     Jacqueline [name] M.D. [facility] 05/10/2023, 6:16 PM  Available via Epic secure chat 7am-7pm After 7 pm, please refer to night coverage provider listed on amion.

## 2023-05-11 DIAGNOSIS — Z8673 Personal history of transient ischemic attack (TIA), and cerebral infarction without residual deficits: Secondary | ICD-10-CM | POA: Diagnosis not present

## 2023-05-11 DIAGNOSIS — Z86711 Personal history of pulmonary embolism: Secondary | ICD-10-CM | POA: Diagnosis not present

## 2023-05-11 DIAGNOSIS — D6862 Lupus anticoagulant syndrome: Secondary | ICD-10-CM | POA: Diagnosis not present

## 2023-05-11 DIAGNOSIS — D696 Thrombocytopenia, unspecified: Secondary | ICD-10-CM | POA: Diagnosis not present

## 2023-05-11 LAB — CBC WITH DIFFERENTIAL/PLATELET
Abs Immature Granulocytes: 0.27 10*3/uL — ABNORMAL HIGH (ref 0.00–0.07)
Basophils Absolute: 0.1 10*3/uL (ref 0.0–0.1)
Basophils Relative: 1 %
Eosinophils Absolute: 0.1 10*3/uL (ref 0.0–0.5)
Eosinophils Relative: 1 %
HCT: 26.9 % — ABNORMAL LOW (ref 36.0–46.0)
Hemoglobin: 8.9 g/dL — ABNORMAL LOW (ref 12.0–15.0)
Immature Granulocytes: 5 %
Lymphocytes Relative: 31 %
Lymphs Abs: 1.9 10*3/uL (ref 0.7–4.0)
MCH: 26.6 pg (ref 26.0–34.0)
MCHC: 33.1 g/dL (ref 30.0–36.0)
MCV: 80.3 fL (ref 80.0–100.0)
Monocytes Absolute: 0.5 10*3/uL (ref 0.1–1.0)
Monocytes Relative: 8 %
Neutro Abs: 3.2 10*3/uL (ref 1.7–7.7)
Neutrophils Relative %: 54 %
Platelets: 35 10*3/uL — ABNORMAL LOW (ref 150–400)
RBC: 3.35 MIL/uL — ABNORMAL LOW (ref 3.87–5.11)
RDW: 14.7 % (ref 11.5–15.5)
WBC: 6 10*3/uL (ref 4.0–10.5)
nRBC: 0.5 % — ABNORMAL HIGH (ref 0.0–0.2)

## 2023-05-11 NOTE — Progress Notes (Signed)
 [facility]                                                                               Ariday Lyall, is a 23 y.o. female, DOB - 2001/02/03, ZOX:[id number] Admit date - 05/06/2023    Outpatient Primary MD for the patient is [name], MD  LOS - 4  days    Brief summary   Jacqueline [name] is a 23 y.o. 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  She was discharged from the hospital on 2/19.  Hospital course was also complicated by thrombocytopenia (etiology unclear) and menorrhagia for which she was seen by heme-onc and OB/GYN.  She was placed on Megace with ultrasound of the pelvis showing no definite abnormality.  She had a follow-up visit with her PCP today and was sent to the ED due to outpatient labs showing platelet count of 15k.  ED physician consulted Dr. Cherly Hensen with heme-onc who recommended giving 1 unit platelets now and trending CBC daily.  Recommended continuing to hold Eliquis if platelet count <50k or bleeding.  Since patient was found to have deficiency of B12, folate, and iron on previous labs, heme-onc recommended repeating testing and replacing as needed.  Anemia panel ordered.  [facility] called to admit.   Pt refusing labs today. Refusing to work with PT.   No new complaints.   Assessment & Plan    Assessment and Plan:  Thrombocytopenia:  Chronic, in the setting of lupus anticoagulant and PE and Eliquis use. No bleeding today.  Patient is on Eliquis and last dose was 3/3.   Heme onc consulted and recommended giving 1 unit of platelets and trending CBC daily.  Recommended holding Eliquis if platelet count <50k or bleeding.  Ferritin is 8, folate 25.1 and and vitamin B12  is 1198, iron level is 34.  S/p 1 unit of platelet transfusion. Platelets s till low at 28,000. No bleeding seen.  Repeat labs ordered, but patient refusing labs.   Oncology recommends to continue with folic and iron supplementation.    Hypokalemia Replaced. Keep K >4.    Hypomagnesemia Replaced. Repeat level wnl.   Iron deficiency anemia S/p 1 unit of prbc transfusion.  Hemoglobin improved to 8.7.  IV iron infusion ordered by oncology.     H/o Lupus anti coagulant, H/o PE, h/o recent large left MCA stroke Eliquis held due to thrombocytopenia.    Mood Disorder and Chronic pain syndrome:  Resume home meds.   H/o MCA stroke with residual right sided weakness.  Recently discharged on 2/29 from [facility].  No new complaints.   Constipation Resume colace.   H/o Tobacco abuse; Counseling given.   Mild hyponatremia Monitor.      Estimated body mass index is 29.48 kg/m as calculated from the following:   Height as of this encounter: 5\' 4"  (1.626 m).   Weight as of this encounter: 77.9 kg.  Code Status: full code.  DVT Prophylaxis:  SCDs Start: 05/06/23 2211  Level of Care: Level of care: Med-Surg Family Communication: None at bedside.   Disposition Plan:     Remains inpatient appropriate:  waiting for platelet counts to improve.   Procedures:  None.   Consultants:   Hematology Oncology.   Antimicrobials:   Anti-infectives (From admission, onward)    None        Medications  Scheduled Meds:  sodium chloride   Intravenous Once   clonazePAM  0.5 mg Oral QHS   docusate sodium  100 mg Oral TID   FLUoxetine  10 mg Oral Daily   folic acid  1 mg Oral Daily   gabapentin  100 mg Oral TID   iron polysaccharides  150 mg Oral Daily   midodrine  2.5 mg Oral TID WC   multivitamin with minerals  1 tablet Oral Daily   polyethylene glycol  17 g Oral BID   Continuous Infusions:   PRN Meds:.acetaminophen **OR** acetaminophen, naLOXone (NARCAN)  injection,  oxyCODONE    Subjective:   Jacqueline [name] was seen and examined today.  No new cmplaints. Refusing labs and PT eval  Objective:   Vitals:   05/10/23 1421 05/10/23 2208 05/11/23 0415 05/11/23 1333  BP: (!) 103/56 (!) 114/58 (!) 90/54 93/60  Pulse: 87 89 78 85  Resp: 17 16 15 18   Temp: 97.8 F (36.6 C) 98.5 F (36.9 C) 97.6 F (36.4 C) 98.2 F (36.8 C)  TempSrc:  Oral Oral Oral  SpO2: 100% 100% 100% 100%  Weight:      Height:        Intake/Output Summary (Last 24 hours) at 05/11/2023 1738 Last data filed at 05/11/2023 1333 Gross per 24 hour  Intake 500 ml  Output 800 ml  Net -300 ml   Filed Weights   05/07/23 1546  Weight: 77.9 kg     Exam General exam: Appears calm and comfortable  Respiratory system: Clear to auscultation. Respiratory effort normal. Cardiovascular system: S1 & S2 heard, RRR. No JVD, murmurs, rubs, gallops or clicks. No pedal edema. Gastrointestinal system: Abdomen is nondistended, soft and nontender.  Central nervous system: Alert and oriented to person and place, right sided weakness and expressive aphasia.  Extremities: Symmetric 5 x 5 power. Skin: No rashes,  Psychiatry: Mood & affect appropriate.       Data Reviewed:  I have personally reviewed following labs and imaging studies   CBC Lab Results  Component Value Date   WBC 4.8 05/10/2023   RBC 3.34 (L) 05/10/2023   HGB 8.9 (L) 05/10/2023   HCT 27.4 (L) 05/10/2023   MCV 82.0 05/10/2023   MCH 26.6 05/10/2023   PLT 28 (LL) 05/10/2023   MCHC 32.5 05/10/2023   RDW 14.9 05/10/2023   LYMPHSABS 1.9 05/10/2023   MONOABS 0.4 05/10/2023   EOSABS 0.1 05/10/2023   BASOSABS 0.0 05/10/2023     Last metabolic panel Lab Results  Component Value Date   NA 133 (L) 05/09/2023   K 3.6 05/09/2023   CL 104 05/09/2023   CO2 21 (L) 05/09/2023   BUN 15 05/09/2023   CREATININE 0.83 05/09/2023   GLUCOSE 90 05/09/2023   GFRNONAA >60 05/09/2023   GFRAA >60 12/06/2016   CALCIUM 8.4 (L)  05/09/2023   PHOS 5.3 (H) 03/12/2023   PROT 6.2 (L) 04/04/2023   ALBUMIN 2.4 (L) 04/04/2023   BILITOT 0.3 04/04/2023   ALKPHOS 78 04/04/2023   AST 33 04/04/2023   ALT 35 04/04/2023   ANIONGAP 8 05/09/2023  CBG (last 3)  No results for input(s): "GLUCAP" in the last 72 hours.    Coagulation Profile: No results for input(s): "INR", "PROTIME" in the last 168 hours.   Radiology Studies: No results found.     Kathlen Mody M.D. [facility] 05/11/2023, 5:38 PM  Available via Epic secure chat 7am-7pm After 7 pm, please refer to night coverage provider listed on amion.

## 2023-05-11 NOTE — Evaluation (Signed)
 Physical Therapy Evaluation Patient Details Name: Jacqueline [name] MRN: [id number] DOB: 2000/05/28 Today's Date: 05/11/2023  History of Present Illness  Jacqueline [name] is a 23 y.o. female admitted for thrombocytopenia. 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   Clinical Impression  Pt a 23 y.o. female with above HPI resulting in the deficits listed below (see PT Problem List). Pt currently living with her sister and has assist from sister, family, and [facility] aide who provide 24/7 supervision/assist. Pt recently d/c from inpatient rehab mid February. Pt has been using w/c as primary means of mobility with limited distance ambulation using hemi walker and KAFO. Pt performed stand pivot transfer to wheelchair with CGA and increased time, able to propel wheelchair ~29ft with use of L UE/L LE. Pt will benefit from skilled PT to maximize functional mobility to increase independence. Recommend OPPT upon d/c.          If plan is discharge home, recommend the following: A little help with walking and/or transfers;Help with stairs or ramp for entrance;Assist for transportation;Assistance with cooking/housework;A lot of help with bathing/dressing/bathroom   Can travel by private vehicle        Equipment Recommendations None recommended by PT (pt has required equipment, waitign for delivery of custom w/c)  Recommendations for Other Services  OT consult    Functional Status Assessment Patient has had a recent decline in their functional status and demonstrates the ability to make significant improvements in function in a reasonable and predictable amount of time.     Precautions / Restrictions Precautions Precautions: Fall Precaution/Restrictions Comments:  expressive aphasia, Rt hemi. Sister reports she has KAFO for R LE with ambulation and has brace on R UE Restrictions Weight Bearing Restrictions Per Provider Order: No      Mobility  Bed Mobility Overal bed mobility: Modified Independent             General bed mobility comments: increased time for progression of R LE onto/off EOB    Transfers Overall transfer level: Needs assistance Equipment used: None Transfers: Sit to/from Stand, Bed to chair/wheelchair/BSC Sit to Stand: Contact guard assist Stand pivot transfers: Contact guard assist         General transfer comment: able to perform CGA to personal w/c in room with prompting from PT for setup that pt usually uses at home. Therapist stabilizing w/c due to poor brakes on w/c. Pt with use of armrest and bedrail for UE support to stabilize with transfer.    Ambulation/Gait               General Gait Details: encouraged pt to see if family could bring in KAFO for ambulation trials during acute stay.  Stairs            Merchant [employer] officer mobility: Yes Wheelchair propulsion: Left upper extremity, Left lower extremity Wheelchair parts: Needs assistance Distance: 80 Wheelchair Assistance Details (indicate cue type and reason): assist for locking/unlocking brakes and for movement of leg rests fully on R side. Pt intiially using only R LE, cues to utilize R UE as well for improve ease/efficency.   Tilt Bed    Modified Rankin (Stroke Patients Only)       Balance Overall balance assessment: Needs assistance Sitting-balance support: Feet supported, Single extremity supported Sitting balance-Leahy Scale: Fair  Standing balance support: Single extremity supported, During functional activity, Reliant on assistive device for balance Standing balance-Leahy Scale: Fair                               Pertinent Vitals/Pain Pain Assessment Pain Assessment:  No/denies pain    Home Living Family/patient expects to be discharged to:: Private residence Living Arrangements: Other relatives (sister) Available Help at Discharge: Family;Available 24 hours/day (sister, sister's boyfriend, [facility], other family members) Type of Home: House Home Access: Stairs to enter   [employer] of Steps: 2   Home Layout: Two level;Able to live on main level with bedroom/bathroom Home Equipment: BSC/3in1;Wheelchair - manual;Other (comment) (hemi walker) Additional Comments: Sister asissted with PLOF/home info. Pt recently d/c from rehab mid february and has been home with sister since. Has [facility] that comes 7days/wk from 10-12p and also offers additional hours PRN. Pt currently working on getting OPPT services set up. Pt assisted into/out of home via boosting in w/c.    Prior Function Prior Level of Function : Needs assist       Physical Assist : Mobility (physical);ADLs (physical) Mobility (physical): Transfers;Gait ADLs (physical): Grooming;Bathing;Dressing;IADLs Mobility Comments: pt has been using w/c as primary means of mobilitiy transferring via stand pivot with supervision, has hemi walker for short distance ambulation. Waiting for custom w/c to arrive (current w/c with malfunctioning brakes).  Someone always with pt during mobility. ADLs Comments: sponge baths at baseline right now with assist- pt is able to assist some (R hemi)     Extremity/Trunk Assessment   Upper Extremity Assessment Upper Extremity Assessment: RUE deficits/detail RUE Deficits / Details: limited finger extension, poor grip, limited elbow flexion and unable to perform shoulder shrug or AROM from shoulder.    Lower Extremity Assessment Lower Extremity Assessment: RLE deficits/detail RLE Deficits / Details: able to perform limited range SLR, wiggle toes, unable to perform AROM DF/PF at ankle. Difficulty with command following for performance of active knee flexion despite  multimdal cuing ultimately reporting that she is unable to do.    Cervical / Trunk Assessment Cervical / Trunk Assessment: Normal  Communication   Communication Communication: Impaired Factors Affecting Communication: Difficulty expressing self (expressive aphasia)    Cognition Arousal: Alert Behavior During Therapy: WFL for tasks assessed/performed                           PT - Cognition Comments: Pt has expressive aphasia- contacted sister via phone during session to assist with PLOF and home environment. Following commands: Impaired Following commands impaired: Follows multi-step commands inconsistently (expressive aphasia, requries intermittent repetition and increased time for processing)     Cueing Cueing Techniques: Verbal cues, Visual cues, Tactile cues     General Comments      Exercises     Assessment/Plan    PT Assessment Patient needs continued PT services  PT Problem List Decreased strength;Decreased range of motion;Decreased activity tolerance;Decreased balance;Decreased mobility       PT Treatment Interventions DME instruction;Gait training;Functional mobility training;Therapeutic activities;Therapeutic exercise;Balance training;Neuromuscular re-education;Patient/family education;Wheelchair mobility training    PT Goals (Current goals can be found in the Care Plan section)  Acute Rehab PT Goals Patient Stated Goal: continue to get stronger and work with therapy services (PT and OT) PT Goal Formulation: With patient/family Time For Goal Achievement: 05/25/23 Potential to Achieve Goals: Good Additional Goals Additional Goal #1: Pt will propel wheelchair  176ft with modified independence    Frequency Min 2X/week     Co-evaluation               AM-PAC PT "6 Clicks" Mobility  Outcome Measure Help needed turning from your back to your side while in a flat bed without using bedrails?: A Little Help needed moving from lying on your back  to sitting on the side of a flat bed without using bedrails?: A Little Help needed moving to and from a bed to a chair (including a wheelchair)?: A Little Help needed standing up from a chair using your arms (e.g., wheelchair or bedside chair)?: A Little Help needed to walk in hospital room?: A Lot Help needed climbing 3-5 steps with a railing? : Total 6 Click Score: 15    End of Session   Activity Tolerance: Patient tolerated treatment well Patient left: in bed;with call bell/phone within reach;with bed alarm set (pt deferred recliner) Nurse Communication: Mobility status PT Visit Diagnosis: Unsteadiness on feet (R26.81);Other abnormalities of gait and mobility (R26.89);Difficulty in walking, not elsewhere classified (R26.2);Hemiplegia and hemiparesis Hemiplegia - Right/Left: Right Hemiplegia - dominant/non-dominant: Dominant    Time: 1610-9604 PT Time Calculation (min) (ACUTE ONLY): 32 min   Charges:   PT Evaluation $PT Eval Low Complexity: 1 Low PT Treatments $Therapeutic Activity: 8-22 mins PT General Charges $$ ACUTE PT VISIT: 1 Visit         Lyman Speller PT, DPT  Acute Rehabilitation Services  Office [phone]  05/11/2023, 5:44 PM

## 2023-05-12 DIAGNOSIS — D649 Anemia, unspecified: Secondary | ICD-10-CM | POA: Diagnosis not present

## 2023-05-12 DIAGNOSIS — Z86711 Personal history of pulmonary embolism: Secondary | ICD-10-CM | POA: Diagnosis not present

## 2023-05-12 DIAGNOSIS — D6862 Lupus anticoagulant syndrome: Secondary | ICD-10-CM | POA: Diagnosis not present

## 2023-05-12 DIAGNOSIS — Z8673 Personal history of transient ischemic attack (TIA), and cerebral infarction without residual deficits: Secondary | ICD-10-CM | POA: Diagnosis not present

## 2023-05-12 DIAGNOSIS — D6861 Antiphospholipid syndrome: Secondary | ICD-10-CM

## 2023-05-12 DIAGNOSIS — D696 Thrombocytopenia, unspecified: Secondary | ICD-10-CM | POA: Diagnosis not present

## 2023-05-12 LAB — BASIC METABOLIC PANEL
Anion gap: 8 (ref 5–15)
BUN: 18 mg/dL (ref 6–20)
CO2: 21 mmol/L — ABNORMAL LOW (ref 22–32)
Calcium: 8.7 mg/dL — ABNORMAL LOW (ref 8.9–10.3)
Chloride: 107 mmol/L (ref 98–111)
Creatinine, Ser: 0.83 mg/dL (ref 0.44–1.00)
GFR, Estimated: >60 mL/min (ref >60)
Glucose, Bld: 92 mg/dL (ref 70–99)
Potassium: 3.7 mmol/L (ref 3.5–5.1)
Sodium: 136 mmol/L (ref 135–145)

## 2023-05-12 LAB — CBC WITH DIFFERENTIAL/PLATELET
Abs Immature Granulocytes: 0.01 10*3/uL (ref 0.00–0.07)
Basophils Absolute: 0 10*3/uL (ref 0.0–0.1)
Basophils Relative: 1 %
Eosinophils Absolute: 0.1 10*3/uL (ref 0.0–0.5)
Eosinophils Relative: 2 %
HCT: 27.3 % — ABNORMAL LOW (ref 36.0–46.0)
Hemoglobin: 8.8 g/dL — ABNORMAL LOW (ref 12.0–15.0)
Immature Granulocytes: 0 %
Lymphocytes Relative: 34 %
Lymphs Abs: 1.7 10*3/uL (ref 0.7–4.0)
MCH: 27 pg (ref 26.0–34.0)
MCHC: 32.2 g/dL (ref 30.0–36.0)
MCV: 83.7 fL (ref 80.0–100.0)
Monocytes Absolute: 0.4 10*3/uL (ref 0.1–1.0)
Monocytes Relative: 9 %
Neutro Abs: 2.7 10*3/uL (ref 1.7–7.7)
Neutrophils Relative %: 54 %
Platelets: 29 10*3/uL — CL (ref 150–400)
RBC: 3.26 MIL/uL — ABNORMAL LOW (ref 3.87–5.11)
RDW: 14.9 % (ref 11.5–15.5)
WBC: 4.9 10*3/uL (ref 4.0–10.5)
nRBC: 0 % (ref 0.0–0.2)

## 2023-05-12 LAB — IMMATURE PLATELET FRACTION: Immature Platelet Fraction: 26.7 % — ABNORMAL HIGH (ref 1.2–8.6)

## 2023-05-12 MED ORDER — IMMUNE GLOBULIN (HUMAN) 10 GM/100ML IV SOLN
1.0000 g/kg | INTRAVENOUS | Status: AC
  Administered 2023-05-13 – 2023-05-14 (×2): 65 g via INTRAVENOUS
  Filled 2023-05-12 (×2): qty 650

## 2023-05-12 MED ORDER — LORATADINE 10 MG PO TABS
10.0000 mg | ORAL_TABLET | ORAL | Status: AC
  Administered 2023-05-12 – 2023-05-13 (×2): 10 mg via ORAL
  Filled 2023-05-12 (×2): qty 1

## 2023-05-12 MED ORDER — ACETAMINOPHEN 500 MG PO TABS
1000.0000 mg | ORAL_TABLET | ORAL | Status: AC
  Administered 2023-05-12 – 2023-05-13 (×2): 1000 mg via ORAL
  Filled 2023-05-12 (×2): qty 2

## 2023-05-12 MED ORDER — METHYLPREDNISOLONE SODIUM SUCC 125 MG IJ SOLR
125.0000 mg | INTRAMUSCULAR | Status: AC
  Administered 2023-05-12 – 2023-05-13 (×2): 125 mg via INTRAVENOUS
  Filled 2023-05-12 (×2): qty 2

## 2023-05-12 NOTE — Progress Notes (Signed)
 No IVF/meds ordered, pt stated she want to wait until IV meds or IV treatment is ordered before getting IV. Primary nurse secure chatted.

## 2023-05-12 NOTE — Progress Notes (Addendum)
 Jacqueline [name]   DOB:08/04/2000   [id number]      ASSESSMENT & PLAN:  Acute on chronic thrombocytopenia Previously low folate and B12 now normal.   Negative HIV.  Normal INR and LFT Currently with menorrhalgia from anticoagulation with consumptive etiology Hep panel neg Platelets low 29k today. S/p one unit platelets on 3/9. Recommend platelet transfusion for counts <20k or <50k with active bleeding. Patient has been refusing labs.  Hematology/Dr. [name] following   Anemia IDA Patient with heavy menorrhagia last week.  Denies at this time. 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   Lupus anticoagulant was identified once in 2024 Status post Eliquis which was stopped in June 2024.  However was restarted due to recent stroke.  Last dose 05/05/2023.  Hold Eliquis at this time due to low platelets. Patient has seen Dr. [name] before in 2024.  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     Noted difficulty with speech and word finding Continue supportive care       Code Status Full  Subjective:  Patient seen asleep in bed.  During assessment patient appears somnolent and only give yes and no responses.  Discussed with nurse who states patient did not sleep well overnight, no new acute events.  Objective:  Vitals:   05/12/23 0344 05/12/23 0854  BP: (!) 95/52 (!) 96/59  Pulse: 79 80  Resp: 18 14  Temp: 98.2 F (36.8 C) 98 F (36.7 C)  SpO2: 98% 100%     Intake/Output Summary (Last 24 hours) at 05/12/2023 1038 Last data filed at 05/12/2023 5573 Gross per  24 hour  Intake 530 ml  Output 700 ml  Net -170 ml     REVIEW OF SYSTEMS:   Constitutional: Denies fevers, chills or abnormal night sweats Eyes: Denies blurriness of vision, double vision or watery eyes Ears, nose, mouth, throat, and face: Denies mucositis or sore throat Respiratory: Denies cough, dyspnea or wheezes Cardiovascular: Denies palpitation, chest discomfort or lower extremity swelling Gastrointestinal:  Denies nausea, heartburn or change in bowel habits Skin: Denies abnormal skin rashes Lymphatics: Denies new lymphadenopathy or easy bruising Neurological: Denies numbness, tingling or new weaknesses Behavioral/Psych: Mood is stable, no new changes  All other systems were reviewed with the patient and are negative.  PHYSICAL EXAMINATION: ECOG PERFORMANCE STATUS: 3 - Symptomatic, >50% confined to bed  Vitals:   05/12/23 0344 05/12/23 0854  BP: (!) 95/52 (!) 96/59  Pulse: 79 80  Resp: 18 14  Temp: 98.2 F (36.8 C) 98 F (36.7 C)  SpO2: 98% 100%   Filed Weights   05/07/23 1546  Weight: 171 lb 11.8 oz (77.9 kg)    GENERAL: + Somnolent SKIN: skin color, texture, turgor are normal, no rashes or significant lesions EYES: normal, conjunctiva are pink and non-injected, sclera clear OROPHARYNX: no exudate, no erythema and lips, buccal mucosa, and tongue normal  NECK: supple, thyroid normal size, non-tender, without nodularity LYMPH: no palpable lymphadenopathy in the cervical, axillary or inguinal LUNGS: clear to auscultation and percussion with normal breathing effort HEART: regular rate & rhythm and no murmurs and no lower extremity edema ABDOMEN: abdomen soft,  non-tender and normal bowel sounds MUSCULOSKELETAL: no cyanosis of digits and no clubbing  PSYCH: alert & oriented x 3 with fluent speech NEURO: no focal motor/sensory deficits   All questions were answered. The patient knows to call the clinic with any problems, questions or concerns.   The total time  spent in the appointment was 40 minutes encounter with patient including review of chart and various tests results, discussions about plan of care and coordination of care plan  Dawson Bills, NP 05/12/2023 10:38 AM    Labs Reviewed:  Lab Results  Component Value Date   WBC 4.9 05/12/2023   HGB 8.8 (L) 05/12/2023   HCT 27.3 (L) 05/12/2023   MCV 83.7 05/12/2023   PLT 29 (LL) 05/12/2023   Recent Labs    03/26/23 0210 03/27/23 1404 03/28/23 1311 03/29/23 0640 04/04/23 2216 04/07/23 0537 05/08/23 0315 05/09/23 0329 05/12/23 0307  NA 129*   < > 140   < > 137   < > 138 133* 136  K 3.7   < > 3.3*   < > 3.3*   < > 3.9 3.6 3.7  CL 99   < > 108   < > 107   < > 108 104 107  CO2 21*   < > 23   < > 20*   < > 22 21* 21*  GLUCOSE 114*   < > 92   < > 110*   < > 94 90 92  BUN 13   < > 8   < > 13   < > 13 15 18   CREATININE 0.73   < > 0.79   < > 0.88   < > 1.03* 0.83 0.83  CALCIUM 8.7*   < > 8.8*   < > 8.1*   < > 8.6* 8.4* 8.7*  GFRNONAA >60   < > >60   < > >60   < > >60 >60 >60  PROT 7.3  --  6.7  --  6.2*  --   --   --   --   ALBUMIN 2.7*  --  2.3*  --  2.4*  --   --   --   --   AST 44*  --  65*  --  33  --   --   --   --   ALT 58*  --  92*  --  35  --   --   --   --   ALKPHOS 97  --  91  --  78  --   --   --   --   BILITOT 0.4  --  0.6  --  0.3  --   --   --   --    < > = values in this interval not displayed.    Studies Reviewed:  MR BRAIN WO CONTRAST Result Date: 04/18/2023 CLINICAL DATA:  Neuro deficit, acute, stroke suspected. EXAM: MRI HEAD WITHOUT CONTRAST TECHNIQUE: Multiplanar, multiecho pulse sequences of the brain and surrounding structures were obtained without intravenous contrast. COMPARISON:  MRI of the brain February 27, 2023. FINDINGS: Brain: Large area of encephalomalacia and gliosis in the left frontotemporal parietal and insular region consistent with known prior left MCA territory infarct. Within that area, multiple small focal areas of restricted diffusion are seen,  suggestive acute on chronic process. Other areas of encephalomalacia and gliosis are seen in the left frontal lobe as well as in the left caudate and right ACA/MCA watershed  region. Hemosiderin deposit is seen in left temporal lobe. No hydrocephalus, extra-axial collection mass lesion. Vascular: Normal flow voids. Skull and upper cervical spine: Diffuse decrease of the T1 signal of the visualized osseous structures, may represent red marrow reconversion Sinuses/Orbits: Trace mucosal thickening of paranasal sinuses and mastoids. The orbits are maintained. Other: None. IMPRESSION: 1. Large area of encephalomalacia and gliosis in the left frontotemporal parietal and insular region consistent with known prior left MCA territory infarct. Within that area, multiple small focal areas of restricted diffusion are seen, suggestive of acute on chronic process. 2. Other areas of encephalomalacia and gliosis in the left frontal lobe as well as in the left caudate and right ACA/MCA watershed region. Electronically Signed   By: Baldemar Lenis M.D.   On: 04/18/2023 12:52   CT HEAD CODE STROKE WO CONTRAST` Result Date: 04/18/2023 CLINICAL DATA:  Code stroke.  Diplopia EXAM: CT HEAD WITHOUT CONTRAST TECHNIQUE: Contiguous axial images were obtained from the base of the skull through the vertex without intravenous contrast. RADIATION DOSE REDUCTION: This exam was performed according to the departmental dose-optimization program which includes automated exposure control, adjustment of the mA and/or kV according to patient size and/or use of iterative reconstruction technique. COMPARISON:  Head CT 03/03/2023 FINDINGS: Brain: Evolving left MCA territory infarct, not significantly changed from prior exam. Negative for an acute infarct. No mass effect. No mass lesion. No CT evidence of an acute cortical infarct Vascular: No hyperdense vessel or unexpected calcification. Skull: Normal. Negative for fracture or focal lesion.  Sinuses/Orbits: No middle ear or mastoid effusion. Paranasal sinuses are clear. Orbits are unremarkable. Other: None. ASPECTS [facility] Stroke Program Early CT Score): 10 when accounting for chronic findings IMPRESSION: 1. No hemorrhage or CT evidence of an acute cortical infarct. 2. Evolving left MCA territory infarct, not significantly changed from prior exam. Findings were paged to Dr. Derry Lory on 04/18/23 at 11:43 AM. Electronically Signed   By: Lorenza Cambridge M.D.   On: 04/18/2023 11:43   DG Abd 1 View Result Date: 04/16/2023 CLINICAL DATA:  Midline abdominal pain EXAM: ABDOMEN - 1 VIEW COMPARISON:  03/29/2023 FINDINGS: Gastric tube again noted projecting over the left abdomen. Nonobstructive bowel gas pattern. No organomegaly or free air. Moderate stool burden. Visualized lung bases clear. IMPRESSION: No bowel obstruction or free air. Moderate stool burden. Electronically Signed   By: Charlett Nose M.D.   On: 04/16/2023 18:29   ADDENDUM  .Patient was Personally and independently interviewed, examined and relevant elements of the history of present illness were reviewed in details and an assessment and plan was created. All elements of the patient's history of present illness , assessment and plan were discussed in details with Hinton Dyer NP. The above documentation reflects our combined findings assessment and plan.  Patient with  1) IDA and previous folate deficiency. IDa due to menorrhagia which she has had prior to anticoagulation and now worse with Anticoagulation and thrombocytopenia. Currently periods are over. Plan -aggressive IV iron replacement to maintain ferritin>100 and iron saturation >20% -on folic acid and po iron (received 1 dose of IV Iron)...would recommend 1 additional dose of IV iron prior to discharge. -f/u with Ob Gyn to help control her periods. Was previously on Megace which might increase risk of bleeding. Also would not recommend systemic hormonal birth control pills due  to high risk of VTE in the context of previous h/o PE and antiphospholipid antibody syndrome.  2) Acute and chronic Thrombocytopenia. Likely multifactorial. Some component  of drop due to consumption from significant menstrual bleeding. Concern for Antiphospholipid antibody related thrombocytopenia that can be immune mediated , from consumption related to microthrombi, complement activation and possible autoimmunity. No overt associated autoimmune condition at this time associated with the antiphospholipid antibody syndrome at this time. Patient has previously had Heparin exposure in the recent few months as well.  3) Lupus anticoagulant persistently strongly positive and confirmed with Hexagonal phospholipid neutralization assay. Presenting with previous PE, CVA and also potentially associated with patients thrombocytopenia. Discussed with patient with her cousin and boyfriend at bedside and sister on the phone that she remains at high risk of venous and arterial thrombosis as well as bleeding if she were put on therapeutic anticoagulation at this time. PLAN Patient is restless to discharge home and I spend significant time discussing the above considerations. Discussed and obtained consent from patient and family for IVIG 1g/kg q24h x 2 doses with tylenol, loratadine and solumedrol as premedications. -if response will consider startring steroids with slow taper. -restart patient on Eliquis 2.5mg  po BID once platelets>50k and then transition to 5mg  po BID if platelets stable and menstrual bleeding controlled. -if menstrual bleeding cannot be controlled by reasonable ObGyn input then might need to consider Lupron or Goserelin to shut down periods for now. -if response to IVIG but still with persistent thrombocytopenia -- might need to consider Rituxan as outpatient. -hematology will continue to follow.  Appreciate excellent hospital medicine cares.    Wyvonnia Lora MD MS .The total time spent in the  appointment was 60 minutes* .  All of the patient's questions were answered with apparent satisfaction. The patient knows to call the clinic with any problems, questions or concerns.   Wyvonnia Lora MD MS AAHIVMS [facility] Hematology/Oncology Physician [facility]  .*Total Encounter Time as defined by the Centers for Medicare and Medicaid Services includes, in addition to the face-to-face time of a patient visit (documented in the note above) non-face-to-face time: obtaining and reviewing outside history, ordering and reviewing medications, tests or procedures, care coordination (communications with other health care professionals or caregivers) and documentation in the medical record.

## 2023-05-12 NOTE — Plan of Care (Signed)

## 2023-05-12 NOTE — Evaluation (Addendum)
 Occupational Therapy Evaluation Patient Details Name: TALLI KIMMER MRN: [id number] DOB: 2000-10-04 Today's Date: 05/12/2023   History of Present Illness   Cadey Napierkowski is a 23 y.o. female admitted for thrombocytopenia. 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     Clinical Impressions PTA, pt was discharged from CIR home with sister and receives [facility] aide assistance 7 days per week with 24 hr S/assistance due to effects of R sided hemiplegia, cognitive and language deficits. Patient now presents with (see current problem list and functional status below) cognitive/language, R sided weakness, sensation, motor planning, coordination, pain and balance and activity tolerance impairments impacting BADL's and transfers/mobility. Patient utilizes manual w/c primarily and has hemi walker for standing and assisted amb +2. Pt continues to require Acute OT services while in hospital setting. Recommending home with 24 hr S and assistance from family and hired PCA with HHOT services required.      If plan is discharge home, recommend the following:   Two people to help with walking and/or transfers;A lot of help with bathing/dressing/bathroom;Assistance with cooking/housework;Direct supervision/assist for medications management;Direct supervision/assist for financial management;Assist for transportation;Help with stairs or ramp for entrance;Supervision due to cognitive status     Functional Status Assessment   Patient has had a recent decline in their functional status and demonstrates the ability to make significant improvements in function in a reasonable and predictable amount of time.     Equipment Recommendations   None recommended by OT       Precautions/Restrictions   Precautions Precautions: Fall Precaution/Restrictions Comments: expressive aphasia, Rt hemi. Sister reports she has KAFO for R LE with ambulation and has brace on R UE Required Braces or Orthoses: Splint/Cast (R wrist support and R KAFO) Restrictions Weight Bearing Restrictions Per Provider Order: No     Mobility Bed Mobility Overal bed mobility: Modified Independent                  Transfers Overall transfer level: Needs assistance Equipment used: None Transfers: Sit to/from Stand, Bed to chair/wheelchair/BSC Sit to Stand: Contact guard assist Stand pivot transfers: Contact guard assist         General transfer comment: cues for set up, decreasing impulsivity and mild frustration tolerance (BSC to L side to commode then to R side back to bed)      Balance Overall balance assessment: Needs assistance Sitting-balance support: Feet supported, Single extremity supported Sitting balance-Leahy Scale: Fair     Standing balance support: Single extremity supported, During functional activity, Reliant on assistive device for balance Standing balance-Leahy Scale: Fair                             ADL either performed or assessed with clinical judgement   ADL Overall ADL's : Needs assistance/impaired Eating/Feeding: Set up Eating/Feeding Details (indicate cue type and reason): R hand dominant with impairment Grooming: Wash/dry hands;Wash/dry face;Oral care;Contact guard assist;Sitting;Cueing for compensatory techniques;Cueing for sequencing   Upper Body Bathing: Minimal assistance;Sitting   Lower Body Bathing: Moderate assistance;Sitting/lateral leans;Sit to/from stand   Upper Body Dressing : Minimal assistance;Cueing for compensatory techniques;Sitting   Lower Body Dressing: Moderate assistance;Sitting/lateral leans;Sit to/from stand   Toilet Transfer: Dealer Details (indicate cue  type and reason): verbal and visual cues  Toileting- Clothing Manipulation and Hygiene: Minimal assistance Toileting - Clothing Manipulation Details (indicate cue type and reason): unable to manage fasteners due to R sided hemi     Functional mobility during ADLs: Contact guard assist General ADL Comments: R hemiplegia limits all BADL's     Vision Baseline Vision/History: 0 No visual deficits Ability to See in Adequate Light: 0 Adequate Patient Visual Report: No change from baseline Vision Assessment?: No apparent visual deficits     Perception Perception: Impaired Preception Impairment Details: Inattention/Neglect Perception-Other Comments: R sided   Praxis Praxis: Impaired Praxis Impairment Details: Motor planning, Perseveration     Pertinent Vitals/Pain Pain Assessment Pain Assessment: No/denies pain     Extremity/Trunk Assessment Upper Extremity Assessment Upper Extremity Assessment: Right hand dominant;RUE deficits/detail RUE Deficits / Details: mild shoulder shrug initiated, some AAROM triceps, unable to demonstrate AROM distally even with R wrist support in place, very weak grasp with trace flexion noted RUE Sensation: decreased light touch RUE Coordination: decreased fine motor;decreased gross motor   Lower Extremity Assessment Lower Extremity Assessment: Defer to PT evaluation   Cervical / Trunk Assessment Cervical / Trunk Assessment: Normal   Communication Communication Communication: Impaired Factors Affecting Communication: Difficulty expressing self (aphasia expressive> receptive)   Cognition Arousal: Alert Behavior During Therapy: WFL for tasks assessed/performed Cognition: Difficult to assess, Cognition impaired Difficult to assess due to:  (aphasia) Orientation impairments: Person, Place, Situation Awareness: Online awareness impaired Memory impairment (select all impairments): Short-term memory Attention impairment (select first level of impairment):  Sustained attention Executive functioning impairment (select all impairments): Reasoning, Problem solving OT - Cognition Comments: limited insignt into need for hospitalization                 Following commands: Impaired Following commands impaired: Follows multi-step commands inconsistently     Cueing  General Comments   Cueing Techniques: Verbal cues;Visual cues;Tactile cues  no skin issues           Home Living Family/patient expects to be discharged to:: Private residence Living Arrangements: Other relatives Available Help at Discharge: Family;Available 24 hours/day Type of Home: House Home Access: Stairs to enter [employer] of Steps: 2 Entrance Stairs-Rails: None Home Layout: Two level;Able to live on main level with bedroom/bathroom     Bathroom Shower/Tub: Tub/shower unit;None   Firefighter: Standard Bathroom Accessibility: Yes How Accessible: Accessible via walker Home Equipment: BSC/3in1;Wheelchair - manual;Other (comment)   Additional Comments: Sister asissted with PLOF/home info. Pt recently d/c from rehab mid february and has been home with sister since. Has HHaide that comes 7days/wk from 10-12p and also offers additional hours PRN. Pt currently working on getting OPPT services set up. Pt assisted into/out of home via boosting in w/c.  Lives With: Family    Prior Functioning/Environment Prior Level of Function : Needs assist  Cognitive Assist : Mobility (cognitive);ADLs (cognitive) Mobility (Cognitive): Intermittent cues ADLs (Cognitive): Intermittent cues Physical Assist : Mobility (physical);ADLs (physical) Mobility (physical): Transfers;Gait ADLs (physical): Grooming;Bathing;Dressing;IADLs Mobility Comments: pt has been using w/c as primary means of mobilitiy transferring via stand pivot with supervision, has hemi walker for short distance ambulation. Waiting for custom w/c to arrive (current w/c with malfunctioning brakes).   Someone always with pt during mobility. ADLs Comments: sponge baths at baseline right now with assist- pt is able to assist some (R hemi)    OT Problem List: Decreased strength;Decreased range of motion;Decreased activity tolerance;Impaired balance (sitting and/or standing);Impaired vision/perception;Decreased coordination;Decreased cognition;Decreased safety awareness;Decreased knowledge of use of DME or  AE;Impaired sensation;Impaired UE functional use;Pain   OT Treatment/Interventions: Self-care/ADL training;Therapeutic exercise;Neuromuscular education;DME and/or AE instruction;Manual therapy;Therapeutic activities;Cognitive remediation/compensation;Visual/perceptual remediation/compensation;Patient/family education;Balance training      OT Goals(Current goals can be found in the care plan section)   Acute Rehab OT Goals Patient Stated Goal: to go home OT Goal Formulation: With patient Time For Goal Achievement: 05/26/23 Potential to Achieve Goals: Good ADL Goals Pt Will Perform Lower Body Bathing: with contact guard assist;sit to/from stand;sitting/lateral leans Pt Will Perform Upper Body Dressing: with contact guard assist;sitting Pt Will Perform Lower Body Dressing: with min assist;sit to/from stand;sitting/lateral leans;with caregiver independent in assisting Pt Will Transfer to Toilet: with supervision;bedside commode;stand pivot transfer   OT Frequency:  Min 2X/week    AM-PAC OT "6 Clicks" Daily Activity     Outcome Measure Help from another person eating meals?: A Lot Help from another person taking care of personal grooming?: A Little Help from another person toileting, which includes using toliet, bedpan, or urinal?: A Lot Help from another person bathing (including washing, rinsing, drying)?: A Lot Help from another person to put on and taking off regular upper body clothing?: A Little Help from another person to put on and taking off regular lower body clothing?: A  Lot 6 Click Score: 14   End of Session Equipment Utilized During Treatment: Gait belt Nurse Communication: Mobility status  Activity Tolerance: Patient limited by fatigue;Patient limited by pain Patient left: in bed;with bed alarm set;with call bell/phone within reach;with SCD's reapplied  OT Visit Diagnosis: Unsteadiness on feet (R26.81);Other abnormalities of gait and mobility (R26.89);Muscle weakness (generalized) (M62.81);Other symptoms and signs involving the nervous system (R29.898);Cognitive communication deficit (R41.841);Hemiplegia and hemiparesis;Pain Symptoms and signs involving cognitive functions: Cerebral infarction Hemiplegia - Right/Left: Right Hemiplegia - dominant/non-dominant: Non-Dominant Hemiplegia - caused by: Cerebral infarction Pain - Right/Left: Right Pain - part of body: Leg                Time: 1315-1345 OT Time Calculation (min): 30 min Charges:  OT Evaluation $OT Eval Moderate Complexity: 1 Mod OT Treatments $Self Care/Home Management : 8-22 mins Betzaira Mentel OT/L Acute Rehabilitation Department  [phone]  05/12/2023, 4:26 PM

## 2023-05-12 NOTE — Progress Notes (Signed)
 [facility]                                                                               Jacqueline [name], is a 23 y.o. female, DOB - Sep 09, 2000, MVH:[id number] Admit date - 05/06/2023    Outpatient Primary MD for the patient is Jacqueline Arbour, MD  LOS - 5  days    Brief summary   Jacqueline [name] is a 23 y.o. 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  She was discharged from the hospital on 2/19.  Hospital course was also complicated by thrombocytopenia (etiology unclear) and menorrhagia for which she was seen by heme-onc and OB/GYN.  She was placed on Megace with ultrasound of the pelvis showing no definite abnormality.  She had a follow-up visit with her PCP today and was sent to the ED due to outpatient labs showing platelet count of 15k.  ED physician consulted Dr. Cherly Hensen with heme-onc who recommended giving 1 unit platelets now and trending CBC daily.  Recommended continuing to hold Eliquis if platelet count <50k or bleeding.  Since patient was found to have deficiency of B12, folate, and iron on previous labs, heme-onc recommended repeating testing and replacing as needed.  Anemia panel ordered.  TRH called to admit.   Patient's platelets remain low . No signs of bleeding.   Assessment & Plan    Assessment and Plan:  Thrombocytopenia:  Chronic, in the setting of lupus anticoagulant and PE and Eliquis use. No bleeding today.  Patient is on Eliquis and last dose was 3/3.   Heme onc consulted and recommended giving 1 unit of platelets and trending CBC daily.  Recommended holding Eliquis if platelet count <50k or bleeding.  Ferritin is 8, folate 25.1 and and vitamin B12 is 1198, iron  level is 34.  S/p 1 unit of platelet transfusion. Platelets s till low at 29000. No bleeding noted.    Oncology recommends to continue with folic and iron supplementation.    Hypokalemia Replaced. Keep K >4.    Hypomagnesemia Replaced. Repeat level wnl.   Iron deficiency anemia S/p 1 unit of prbc transfusion.  Hemoglobin improved to 8.7.  IV iron infusion ordered by oncology.     H/o Lupus anti coagulant, H/o PE, h/o recent large left MCA stroke Eliquis held due to thrombocytopenia.    Mood Disorder and Chronic pain syndrome:  Resume home meds.   H/o MCA stroke with residual right sided weakness.  Recently discharged on 2/29 from [facility].  No new complaints.   Constipation Resume colace.   H/o Tobacco abuse; Counseling given.   Mild hyponatremia Monitor.      Estimated body mass index is 29.48 kg/m as calculated from the following:   Height as of this encounter: 5\' 4"  (1.626 m).   Weight as of this encounter: 77.9 kg.  Code Status: full code.  DVT Prophylaxis:  SCDs Start: 05/06/23 2211   Level of Care: Level of care: Med-Surg Family Communication: None at  bedside. Discussed with sister over the phone.   Disposition Plan:     Remains inpatient appropriate:  waiting for platelet counts to improve.   Procedures:  None.   Consultants:   Hematology Oncology.   Antimicrobials:   Anti-infectives (From admission, onward)    None        Medications  Scheduled Meds:  sodium chloride   Intravenous Once   clonazePAM  0.5 mg Oral QHS   docusate sodium  100 mg Oral TID   FLUoxetine  10 mg Oral Daily   folic acid  1 mg Oral Daily   gabapentin  100 mg Oral TID   iron polysaccharides  150 mg Oral Daily   midodrine  2.5 mg Oral TID WC   multivitamin with minerals  1 tablet Oral Daily   polyethylene glycol  17 g Oral BID   Continuous Infusions:   PRN Meds:.acetaminophen **OR** acetaminophen, naLOXone (NARCAN)  injection, oxyCODONE    Subjective:    Jacqueline [name] was seen and examined today. No new complaints.   Objective:   Vitals:   05/11/23 2146 05/12/23 0344 05/12/23 0854 05/12/23 1112  BP: 106/68 (!) 95/52 (!) 96/59 (!) 101/56  Pulse: 85 79 80 74  Resp: 16 18 14 14   Temp: 98.8 F (37.1 C) 98.2 F (36.8 C) 98 F (36.7 C) 98.1 F (36.7 C)  TempSrc: Oral Oral Oral Oral  SpO2: 100% 98% 100% 100%  Weight:      Height:        Intake/Output Summary (Last 24 hours) at 05/12/2023 1709 Last data filed at 05/12/2023 1659 Gross per 24 hour  Intake 1120 ml  Output 700 ml  Net 420 ml   Filed Weights   05/07/23 1546  Weight: 77.9 kg     Exam General exam: Appears calm and comfortable  Respiratory system: Clear to auscultation. Respiratory effort normal. Cardiovascular system: S1 & S2 heard, RRR.  Gastrointestinal system: Abdomen is nondistended, soft and nontender.  Central nervous system: Alert and oriented. Right sided weakness, some expressive aphasia.  Extremities: Symmetric 5 x 5 power. Skin: No rashes,  Psychiatry:Mood & affect appropriate.        Data Reviewed:  I have personally reviewed following labs and imaging studies   CBC Lab Results  Component Value Date   WBC 4.9 05/12/2023   RBC 3.26 (L) 05/12/2023   HGB 8.8 (L) 05/12/2023   HCT 27.3 (L) 05/12/2023   MCV 83.7 05/12/2023   MCH 27.0 05/12/2023   PLT 29 (LL) 05/12/2023   MCHC 32.2 05/12/2023   RDW 14.9 05/12/2023   LYMPHSABS 1.7 05/12/2023   MONOABS 0.4 05/12/2023   EOSABS 0.1 05/12/2023   BASOSABS 0.0 05/12/2023     Last metabolic panel Lab Results  Component Value Date   NA 136 05/12/2023   K 3.7 05/12/2023   CL 107 05/12/2023   CO2 21 (L) 05/12/2023   BUN 18 05/12/2023   CREATININE 0.83 05/12/2023   GLUCOSE 92 05/12/2023   GFRNONAA >60 05/12/2023   GFRAA >60 12/06/2016   CALCIUM 8.7 (L) 05/12/2023   PHOS 5.3 (H) 03/12/2023   PROT 6.2 (L) 04/04/2023   ALBUMIN 2.4 (L) 04/04/2023   BILITOT 0.3 04/04/2023   ALKPHOS 78  04/04/2023   AST 33 04/04/2023   ALT 35 04/04/2023   ANIONGAP 8 05/12/2023    CBG (last 3)  No results for input(s): "GLUCAP" in the last 72 hours.    Coagulation Profile: No results for input(s): "INR", "  PROTIME" in the last 168 hours.   Radiology Studies: No results found.     Kathlen Mody M.D. [facility] 05/12/2023, 5:09 PM  Available via Epic secure chat 7am-7pm After 7 pm, please refer to night coverage provider listed on amion.

## 2023-05-13 ENCOUNTER — Inpatient Hospital Stay (HOSPITAL_COMMUNITY)

## 2023-05-13 DIAGNOSIS — D696 Thrombocytopenia, unspecified: Secondary | ICD-10-CM | POA: Diagnosis not present

## 2023-05-13 DIAGNOSIS — Z8673 Personal history of transient ischemic attack (TIA), and cerebral infarction without residual deficits: Secondary | ICD-10-CM | POA: Diagnosis not present

## 2023-05-13 DIAGNOSIS — D6862 Lupus anticoagulant syndrome: Secondary | ICD-10-CM | POA: Diagnosis not present

## 2023-05-13 DIAGNOSIS — D649 Anemia, unspecified: Secondary | ICD-10-CM | POA: Diagnosis not present

## 2023-05-13 DIAGNOSIS — D6861 Antiphospholipid syndrome: Secondary | ICD-10-CM | POA: Diagnosis not present

## 2023-05-13 DIAGNOSIS — Z86711 Personal history of pulmonary embolism: Secondary | ICD-10-CM | POA: Diagnosis not present

## 2023-05-13 LAB — CBC WITH DIFFERENTIAL/PLATELET
Abs Immature Granulocytes: 0.02 10*3/uL (ref 0.00–0.07)
Basophils Absolute: 0 10*3/uL (ref 0.0–0.1)
Basophils Relative: 0 %
Eosinophils Absolute: 0 10*3/uL (ref 0.0–0.5)
Eosinophils Relative: 0 %
HCT: 26.7 % — ABNORMAL LOW (ref 36.0–46.0)
Hemoglobin: 8.6 g/dL — ABNORMAL LOW (ref 12.0–15.0)
Immature Granulocytes: 0 %
Lymphocytes Relative: 19 %
Lymphs Abs: 1 10*3/uL (ref 0.7–4.0)
MCH: 26.5 pg (ref 26.0–34.0)
MCHC: 32.2 g/dL (ref 30.0–36.0)
MCV: 82.4 fL (ref 80.0–100.0)
Monocytes Absolute: 0.2 10*3/uL (ref 0.1–1.0)
Monocytes Relative: 5 %
Neutro Abs: 3.8 10*3/uL (ref 1.7–7.7)
Neutrophils Relative %: 76 %
Platelets: 40 10*3/uL — ABNORMAL LOW (ref 150–400)
RBC: 3.24 MIL/uL — ABNORMAL LOW (ref 3.87–5.11)
RDW: 14.8 % (ref 11.5–15.5)
WBC: 5.1 10*3/uL (ref 4.0–10.5)
nRBC: 0 % (ref 0.0–0.2)

## 2023-05-13 LAB — COMPREHENSIVE METABOLIC PANEL
ALT: 9 U/L (ref 0–44)
AST: 16 U/L (ref 15–41)
Albumin: 3.3 g/dL — ABNORMAL LOW (ref 3.5–5.0)
Alkaline Phosphatase: 57 U/L (ref 38–126)
Anion gap: 9 (ref 5–15)
BUN: 20 mg/dL (ref 6–20)
CO2: 19 mmol/L — ABNORMAL LOW (ref 22–32)
Calcium: 9.2 mg/dL (ref 8.9–10.3)
Chloride: 106 mmol/L (ref 98–111)
Creatinine, Ser: 0.75 mg/dL (ref 0.44–1.00)
GFR, Estimated: >60 mL/min (ref >60)
Glucose, Bld: 119 mg/dL — ABNORMAL HIGH (ref 70–99)
Potassium: 3.8 mmol/L (ref 3.5–5.1)
Sodium: 134 mmol/L — ABNORMAL LOW (ref 135–145)
Total Bilirubin: 0.5 mg/dL (ref 0.0–1.2)
Total Protein: 8.3 g/dL — ABNORMAL HIGH (ref 6.5–8.1)

## 2023-05-13 MED ORDER — IRON SUCROSE 200 MG IVPB - SIMPLE MED
200.0000 mg | Freq: Once | Status: AC
  Administered 2023-05-13: 200 mg via INTRAVENOUS
  Filled 2023-05-13: qty 200

## 2023-05-13 NOTE — Progress Notes (Cosign Needed)
 Jacqueline [name]   DOB:04/22/2000   [id number]      ASSESSMENT & PLAN:  Acute on chronic thrombocytopenia - likely multifactorial.  Some component of drop due to consumption from significant menstrual bleeding. Concern for Antiphospholipid antibody related thrombocytopenia that can be immune mediated , from consumption related to microthrombi, complement activation and possible autoimmunity. No overt associated autoimmune condition at this time associated with the antiphospholipid antibody syndrome at this time.  -  Platelets improved from 29K-40K today.  Patient has been started on steroids Solu-Medrol IV daily x 2, D2 today, continue as ordered. -S/p one unit platelets on 3/9. Recommend platelet transfusion for counts <20k or <50k with active bleeding. - Hematology/Dr. [name] following   Anemia IDA History of folate deficiency History of Menorrhagia - Patient with heavy menorrhagia last week.  - Status post prbc transfusions this admission  - Status post one IV iron infusion.  Additional dose of IV iron ordered today. - continue folic acid and po iron. - Hemoglobin stable 8.6 today - Follow up with Ob/Gyn to help control periods. Was previously on Megace which might increase risk of bleeding. Also would not recommend systemic hormonal birth control pills due to high risk of VTE in the context of previous h/o PE and antiphospholipid antibody syndrome.  - Continue to monitor CBC with differential closely   Positive lupus anticoagulant History of unprovoked PE - Lupus anticoagulant was identified once in 2024. - Lupus anticoagulant persistently strongly positive and confirmed with Hexagonal phospholipid neutralization assay.  - remains at high risk of venous and arterial thrombosis as well as bleeding if she were put on therapeutic anticoagulation at this time  - Status post Eliquis which was stopped in June 2024.  However was restarted due to recent stroke.  Last dose 05/05/2023.  Hold  Eliquis at this time due to low platelets. - Initiate IVIG 1g/kg q24h x 2 doses with tylenol, loratadine and solumedrol as premedications. -if response will consider startring steroids with slow taper. -restart patient on Eliquis 2.5mg  po BID once platelets>50k and then transition to 5mg  po BID if platelets stable and menstrual bleeding controlled. -if menstrual bleeding cannot be controlled by reasonable ObGyn input then might need to consider Lupron or Goserelin to shut down periods for now. -if response to IVIG but still with persistent thrombocytopenia -- might need to consider Rituxan as outpatient. - Patient has seen Dr. [name] before in 2024.  Reported history of noncompliance and not follow-up with further testing   History of stroke Expressive aphasia - Patient admitted with large MCA stroke, discharged 04/23/2023 with residual right-sided weakness.     - Noted difficulty with speech and word finding - Continue supportive care       Code Status Full  Subjective:  Patient seen asleep in bed, awakens easily although remains with minimal verbal response.  Patient's female friend at bedside, reports no acute or new complaints.  No bleeding per patient.  No acute distress is noted.  Objective:  Vitals:   05/13/23 0511 05/13/23 0607  BP: 110/67 113/70  Pulse: 74 81  Resp: 18 17  Temp: 98.4 F (36.9 C)   SpO2: 99% 100%     Intake/Output Summary (Last 24 hours) at 05/13/2023 1025 Last data filed at 05/13/2023 0800 Gross per 24 hour  Intake 1849.91 ml  Output 400 ml  Net 1449.91 ml     REVIEW OF SYSTEMS:   Constitutional: Denies fevers, chills or abnormal night sweats Eyes: Denies blurriness of vision,  double vision or watery eyes Ears, nose, mouth, throat, and face: Denies mucositis or sore throat Respiratory: Denies cough, dyspnea or wheezes Cardiovascular: Denies palpitation, chest discomfort or lower extremity swelling Gastrointestinal:  Denies nausea, heartburn or  change in bowel habits Skin: Denies abnormal skin rashes Lymphatics: Denies new lymphadenopathy or easy bruising Neurological: Denies numbness, tingling or new weaknesses Behavioral/Psych: Mood is stable, no new changes  All other systems were reviewed with the patient and are negative.  PHYSICAL EXAMINATION: ECOG PERFORMANCE STATUS: 2 - Symptomatic, <50% confined to bed  Vitals:   05/13/23 0511 05/13/23 0607  BP: 110/67 113/70  Pulse: 74 81  Resp: 18 17  Temp: 98.4 F (36.9 C)   SpO2: 99% 100%   Filed Weights   05/07/23 1546  Weight: 171 lb 11.8 oz (77.9 kg)    GENERAL: Somnolent, no distress and comfortable SKIN: skin color, texture, turgor are normal, no rashes or significant lesions EYES: normal, conjunctiva are pink and non-injected, sclera clear OROPHARYNX: no exudate, no erythema and lips, buccal mucosa, and tongue normal  NECK: supple, thyroid normal size, non-tender, without nodularity LYMPH: no palpable lymphadenopathy in the cervical, axillary or inguinal LUNGS: clear to auscultation and percussion with normal breathing effort HEART: regular rate & rhythm and no murmurs and no lower extremity edema ABDOMEN: abdomen soft, non-tender and normal bowel sounds MUSCULOSKELETAL: no cyanosis of digits and no clubbing  PSYCH: alert & oriented x 3 with fluent speech NEURO: no focal motor/sensory deficits   All questions were answered. The patient knows to call the clinic with any problems, questions or concerns.   The total time spent in the appointment was 40 minutes encounter with patient including review of chart and various tests results, discussions about plan of care and coordination of care plan  Dawson Bills, NP 05/13/2023 10:25 AM    Labs Reviewed:  Lab Results  Component Value Date   WBC 4.9 05/12/2023   HGB 8.8 (L) 05/12/2023   HCT 27.3 (L) 05/12/2023   MCV 83.7 05/12/2023   PLT 29 (LL) 05/12/2023   Recent Labs    03/26/23 0210 03/27/23 1404  03/28/23 1311 03/29/23 0640 04/04/23 2216 04/07/23 0537 05/08/23 0315 05/09/23 0329 05/12/23 0307  NA 129*   < > 140   < > 137   < > 138 133* 136  K 3.7   < > 3.3*   < > 3.3*   < > 3.9 3.6 3.7  CL 99   < > 108   < > 107   < > 108 104 107  CO2 21*   < > 23   < > 20*   < > 22 21* 21*  GLUCOSE 114*   < > 92   < > 110*   < > 94 90 92  BUN 13   < > 8   < > 13   < > 13 15 18   CREATININE 0.73   < > 0.79   < > 0.88   < > 1.03* 0.83 0.83  CALCIUM 8.7*   < > 8.8*   < > 8.1*   < > 8.6* 8.4* 8.7*  GFRNONAA >60   < > >60   < > >60   < > >60 >60 >60  PROT 7.3  --  6.7  --  6.2*  --   --   --   --   ALBUMIN 2.7*  --  2.3*  --  2.4*  --   --   --   --  AST 44*  --  65*  --  33  --   --   --   --   ALT 58*  --  92*  --  35  --   --   --   --   ALKPHOS 97  --  91  --  78  --   --   --   --   BILITOT 0.4  --  0.6  --  0.3  --   --   --   --    < > = values in this interval not displayed.    Studies Reviewed:  MR BRAIN WO CONTRAST Result Date: 04/18/2023 CLINICAL DATA:  Neuro deficit, acute, stroke suspected. EXAM: MRI HEAD WITHOUT CONTRAST TECHNIQUE: Multiplanar, multiecho pulse sequences of the brain and surrounding structures were obtained without intravenous contrast. COMPARISON:  MRI of the brain February 27, 2023. FINDINGS: Brain: Large area of encephalomalacia and gliosis in the left frontotemporal parietal and insular region consistent with known prior left MCA territory infarct. Within that area, multiple small focal areas of restricted diffusion are seen, suggestive acute on chronic process. Other areas of encephalomalacia and gliosis are seen in the left frontal lobe as well as in the left caudate and right ACA/MCA watershed region. Hemosiderin deposit is seen in left temporal lobe. No hydrocephalus, extra-axial collection mass lesion. Vascular: Normal flow voids. Skull and upper cervical spine: Diffuse decrease of the T1 signal of the visualized osseous structures, may represent red marrow  reconversion Sinuses/Orbits: Trace mucosal thickening of paranasal sinuses and mastoids. The orbits are maintained. Other: None. IMPRESSION: 1. Large area of encephalomalacia and gliosis in the left frontotemporal parietal and insular region consistent with known prior left MCA territory infarct. Within that area, multiple small focal areas of restricted diffusion are seen, suggestive of acute on chronic process. 2. Other areas of encephalomalacia and gliosis in the left frontal lobe as well as in the left caudate and right ACA/MCA watershed region. Electronically Signed   By: Baldemar Lenis M.D.   On: 04/18/2023 12:52   CT HEAD CODE STROKE WO CONTRAST` Result Date: 04/18/2023 CLINICAL DATA:  Code stroke.  Diplopia EXAM: CT HEAD WITHOUT CONTRAST TECHNIQUE: Contiguous axial images were obtained from the base of the skull through the vertex without intravenous contrast. RADIATION DOSE REDUCTION: This exam was performed according to the departmental dose-optimization program which includes automated exposure control, adjustment of the mA and/or kV according to patient size and/or use of iterative reconstruction technique. COMPARISON:  Head CT 03/03/2023 FINDINGS: Brain: Evolving left MCA territory infarct, not significantly changed from prior exam. Negative for an acute infarct. No mass effect. No mass lesion. No CT evidence of an acute cortical infarct Vascular: No hyperdense vessel or unexpected calcification. Skull: Normal. Negative for fracture or focal lesion. Sinuses/Orbits: No middle ear or mastoid effusion. Paranasal sinuses are clear. Orbits are unremarkable. Other: None. ASPECTS [facility] Stroke Program Early CT Score): 10 when accounting for chronic findings IMPRESSION: 1. No hemorrhage or CT evidence of an acute cortical infarct. 2. Evolving left MCA territory infarct, not significantly changed from prior exam. Findings were paged to Dr. Derry Lory on 04/18/23 at 11:43 AM. Electronically  Signed   By: Lorenza Cambridge M.D.   On: 04/18/2023 11:43   DG Abd 1 View Result Date: 04/16/2023 CLINICAL DATA:  Midline abdominal pain EXAM: ABDOMEN - 1 VIEW COMPARISON:  03/29/2023 FINDINGS: Gastric tube again noted projecting over the left abdomen. Nonobstructive bowel gas pattern. No  organomegaly or free air. Moderate stool burden. Visualized lung bases clear. IMPRESSION: No bowel obstruction or free air. Moderate stool burden. Electronically Signed   By: Charlett Nose M.D.   On: 04/16/2023 18:29

## 2023-05-13 NOTE — Plan of Care (Signed)
   Problem: Education: Goal: Knowledge of General Education information will improve Description: Including pain rating scale, medication(s)/side effects and non-pharmacologic comfort measures Outcome: Progressing   Problem: Clinical Measurements: Goal: Ability to maintain clinical measurements within normal limits will improve Outcome: Progressing   Problem: Nutrition: Goal: Adequate nutrition will be maintained Outcome: Progressing   Problem: Elimination: Goal: Will not experience complications related to urinary retention Outcome: Progressing   Problem: Pain Managment: Goal: General experience of comfort will improve and/or be controlled Outcome: Progressing   Problem: Safety: Goal: Ability to remain free from injury will improve Outcome: Progressing

## 2023-05-13 NOTE — Progress Notes (Addendum)
 [facility]                                                                               Jacqueline [name], is a 23 y.o. female, DOB - [birth year], ZOX:[id number] Admit date - 05/06/2023    Outpatient Primary MD for the patient is [name], MD  LOS - 6  days    Brief summary   Jacqueline [name] is a 23 y.o. 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  She was discharged from the hospital on 2/19.  Hospital course was also complicated by thrombocytopenia (etiology unclear) and menorrhagia for which she was seen by heme-onc and OB/GYN.  She was placed on Megace with ultrasound of the pelvis showing no definite abnormality.  She had a follow-up visit with her PCP today and was sent to the ED due to outpatient labs showing platelet count of 15k.  ED physician consulted Dr. Cherly Hensen with heme-onc who recommended giving 1 unit platelets now and trending CBC daily.  Recommended continuing to hold Eliquis if platelet count <50k or bleeding.  Since patient was found to have deficiency of B12, folate, and iron on previous labs, heme-onc recommended repeating testing and replacing as needed.  Anemia panel ordered.  TR[facility] called to admit.   Patient's platelets remain low . No signs of bleeding.   Assessment & Plan    Assessment and Plan:  Thrombocytopenia:  Chronic, in the setting of lupus anticoagulant and PE and Eliquis use. No bleeding today.  Patient is on Eliquis and last dose was 3/3.   Heme onc consulted and recommended giving 1 unit of platelets and trending CBC daily.  Recommended holding Eliquis if platelet count <50k or bleeding.  Ferritin is 8, folate 25.1 and and vitamin B12 is 1198, iron  level is 34.  S/p 1 unit of platelet transfusion.  No bleeding noted.   Oncology on board. Recommended  another dose of IV  venofer,  IV Immuno globulins and IV solumedrol for 2 doses to see if the platelets improve.  Platelets improved from 29,000 to 40,000 appropriately responded to IV steroids.   Hypokalemia Replaced. Keep K >4.    Hypomagnesemia Replaced. Repeat level wnl.   Iron deficiency anemia S/p 1 unit of prbc transfusion followed by 2 doses of IV iron infusion.  Monitor counts.   H/o Lupus anti coagulant, H/o PE, h/o recent large left MCA stroke Eliquis held due to thrombocytopenia.  S/p G tube ( not using it) .  Of note patient reports brownish fluid from G tube earlier today. Requesting the G tube to be removed. Will request IR for removal of the G tube. It was placed on 03/21/23. Her H&h remains stable .  Abd x ray ordered.    Mood Disorder and Chronic pain syndrome:  Resume home meds.   H/o MCA stroke with residual right sided weakness.  Recently discharged on 2/29 from [facility].  No new complaints.   Constipation Resume colace, added miralax.  H/o Tobacco abuse; Counseling given.   Mild hyponatremia Monitor.      Estimated body mass index is 29.48 kg/m as calculated from the following:   Height as of this encounter: 5\' 4"  (1.626 m).   Weight as of this encounter: 77.9 kg.  Code Status: full code.  DVT Prophylaxis:  SCDs Start: 05/06/23 2211   Level of Care: Level of care: Med-Surg Family Communication: None at bedside. Discussed with sister over the phone.   Disposition Plan:     Remains inpatient appropriate:  waiting for platelet counts to improve.   Procedures:  None.   Consultants:   Hematology Oncology. IR  Antimicrobials:   Anti-infectives (From admission, onward)    None        Medications  Scheduled Meds:  sodium chloride   Intravenous Once   acetaminophen  1,000 mg Oral Q24H   clonazePAM  0.5 mg Oral QHS   docusate  sodium  100 mg Oral TID   FLUoxetine  10 mg Oral Daily   folic acid  1 mg Oral Daily   gabapentin  100 mg Oral TID   iron polysaccharides  150 mg Oral Daily   loratadine  10 mg Oral Q24H   methylPREDNISolone (SOLU-MEDROL) injection  125 mg Intravenous Q24H   midodrine  2.5 mg Oral TID WC   multivitamin with minerals  1 tablet Oral Daily   polyethylene glycol  17 g Oral BID   Continuous Infusions:  Immune Globulin 10% 154 mL/hr at 05/13/23 1528    PRN Meds:.acetaminophen **OR** acetaminophen, naLOXone (NARCAN)  injection, oxyCODONE    Subjective:   Jacqueline [name] was seen and examined today. Some abdominal discomfort.   Objective:   Vitals:   05/13/23 0417 05/13/23 0511 05/13/23 0607 05/13/23 1343  BP: (!) 98/58 110/67 113/70 106/68  Pulse: 84 74 81 78  Resp: 16 18 17 18   Temp: 98.2 F (36.8 C) 98.4 F (36.9 C)  98.5 F (36.9 C)  TempSrc: Oral Oral    SpO2: 100% 99% 100% 100%  Weight:      Height:        Intake/Output Summary (Last 24 hours) at 05/13/2023 1734 Last data filed at 05/13/2023 1528 Gross per 24 hour  Intake 1359.91 ml  Output 0 ml  Net 1359.91 ml   Filed Weights   05/07/23 1546  Weight: 77.9 kg     Exam General exam: Appears calm and comfortable  Respiratory system: Clear to auscultation. Respiratory effort normal. Cardiovascular system: S1 & S2 heard, RRR. No JVD, Gastrointestinal system: Abdomen is nondistended, soft and nontender.  Central nervous system: Alert and oriented. Right sided residual weakness and some expressive aphasia.  Extremities: Symmetric 5 x 5 power. Skin: No rashes,  Psychiatry: Mood & affect appropriate.         Data Reviewed:  I have personally reviewed following labs and imaging studies   CBC Lab Results  Component Value Date   WBC 5.1 05/13/2023   RBC 3.24 (L) 05/13/2023   HGB 8.6 (L) 05/13/2023   HCT 26.7 (L) 05/13/2023   MCV 82.4 05/13/2023   MCH 26.5 05/13/2023   PLT 40 (L) 05/13/2023   MCHC 32.2  05/13/2023   RDW 14.8 05/13/2023   LYMPHSABS 1.0 05/13/2023   MONOABS 0.2 05/13/2023   EOSABS 0.0 05/13/2023   BASOSABS 0.0 05/13/2023     Last metabolic panel Lab Results  Component Value Date   NA 134 (L) 05/13/2023   K 3.8 05/13/2023  CL 106 05/13/2023   CO2 19 (L) 05/13/2023   BUN 20 05/13/2023   CREATININE 0.75 05/13/2023   GLUCOSE 119 (H) 05/13/2023   GFRNONAA >60 05/13/2023   GFRAA >60 12/06/2016   CALCIUM 9.2 05/13/2023   PHOS 5.3 (H) 03/12/2023   PROT 8.3 (H) 05/13/2023   ALBUMIN 3.3 (L) 05/13/2023   BILITOT 0.5 05/13/2023   ALKPHOS 57 05/13/2023   AST 16 05/13/2023   ALT 9 05/13/2023   ANIONGAP 9 05/13/2023    CBG (last 3)  No results for input(s): "GLUCAP" in the last 72 hours.    Coagulation Profile: No results for input(s): "INR", "PROTIME" in the last 168 hours.   Radiology Studies: No results found.     Kathlen Mody M.D. [facility] 05/13/2023, 5:34 PM  Available via Epic secure chat 7am-7pm After 7 pm, please refer to night coverage provider listed on amion.

## 2023-05-13 NOTE — Progress Notes (Signed)
 OT Cancellation Note  Patient Details Name: Jacqueline [name] MRN: [id number] DOB: 2000/06/18   Cancelled Treatment:    Reason Eval/Treat Not Completed: Fatigue/lethargy limiting ability to participate  OT attempted visit. Patient's friend just arrived to visit and patient declined therapy. OT will follow up as scheduling permits.    [place] Current OT/L Acute Rehabilitation Department  [phone]   05/13/2023, 3:18 PM

## 2023-05-14 ENCOUNTER — Inpatient Hospital Stay (HOSPITAL_COMMUNITY)

## 2023-05-14 DIAGNOSIS — Z86711 Personal history of pulmonary embolism: Secondary | ICD-10-CM | POA: Diagnosis not present

## 2023-05-14 DIAGNOSIS — D696 Thrombocytopenia, unspecified: Secondary | ICD-10-CM | POA: Diagnosis not present

## 2023-05-14 DIAGNOSIS — Z8673 Personal history of transient ischemic attack (TIA), and cerebral infarction without residual deficits: Secondary | ICD-10-CM | POA: Diagnosis not present

## 2023-05-14 DIAGNOSIS — D6861 Antiphospholipid syndrome: Secondary | ICD-10-CM | POA: Diagnosis not present

## 2023-05-14 DIAGNOSIS — D6862 Lupus anticoagulant syndrome: Secondary | ICD-10-CM | POA: Diagnosis not present

## 2023-05-14 LAB — CBC WITH DIFFERENTIAL/PLATELET
Abs Immature Granulocytes: 0.03 10*3/uL (ref 0.00–0.07)
Basophils Absolute: 0 10*3/uL (ref 0.0–0.1)
Basophils Relative: 0 %
Eosinophils Absolute: 0 10*3/uL (ref 0.0–0.5)
Eosinophils Relative: 0 %
HCT: 26.1 % — ABNORMAL LOW (ref 36.0–46.0)
Hemoglobin: 8.6 g/dL — ABNORMAL LOW (ref 12.0–15.0)
Immature Granulocytes: 1 %
Lymphocytes Relative: 26 %
Lymphs Abs: 1.7 10*3/uL (ref 0.7–4.0)
MCH: 27 pg (ref 26.0–34.0)
MCHC: 33 g/dL (ref 30.0–36.0)
MCV: 82.1 fL (ref 80.0–100.0)
Monocytes Absolute: 0.5 10*3/uL (ref 0.1–1.0)
Monocytes Relative: 7 %
Neutro Abs: 4.4 10*3/uL (ref 1.7–7.7)
Neutrophils Relative %: 66 %
Platelets: 50 10*3/uL — ABNORMAL LOW (ref 150–400)
RBC: 3.18 MIL/uL — ABNORMAL LOW (ref 3.87–5.11)
RDW: 15.4 % (ref 11.5–15.5)
WBC: 6.6 10*3/uL (ref 4.0–10.5)
nRBC: 0 % (ref 0.0–0.2)

## 2023-05-14 LAB — HEPARIN INDUCED PLATELET AB (HIT ANTIBODY): Heparin Induced Plt Ab: 0.772 {OD_unit} — ABNORMAL HIGH (ref 0.000–0.400)

## 2023-05-14 MED ORDER — APIXABAN 2.5 MG PO TABS
2.5000 mg | ORAL_TABLET | Freq: Two times a day (BID) | ORAL | Status: DC
Start: 2023-05-14 — End: 2023-05-15
  Administered 2023-05-14 – 2023-05-15 (×2): 2.5 mg via ORAL
  Filled 2023-05-14 (×2): qty 1

## 2023-05-14 MED ORDER — PREDNISONE 20 MG PO TABS
60.0000 mg | ORAL_TABLET | Freq: Every day | ORAL | Status: DC
  Administered 2023-05-15: 60 mg via ORAL
  Filled 2023-05-14: qty 3

## 2023-05-14 MED FILL — Immune Globulin (Human) IV Soln 20 GM/200ML: INTRAVENOUS | Qty: 200 | Status: AC

## 2023-05-14 MED FILL — Immune Globulin (Human) IV Soln 40 GM/400ML: INTRAVENOUS | Qty: 400 | Status: AC

## 2023-05-14 MED FILL — Immune Globulin (Human) IV Soln 5 GM/50ML: INTRAVENOUS | Qty: 50 | Status: AC

## 2023-05-14 NOTE — Progress Notes (Addendum)
 [facility]  PROGRESS NOTE  ARTESHA WEMHOFF ZOX:[id number] DOB: 2000/06/23 DOA: 05/06/2023 PCP: [name], MD   Brief HPI:    23 y.o. 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  She was discharged from the hospital on 2/19.  Hospital course was also complicated by thrombocytopenia (etiology unclear) and menorrhagia for which she was seen by heme-onc and OB/GYN.  She was placed on Megace with ultrasound of the pelvis showing no definite abnormality.  She had a follow-up visit with her PCP today and was sent to the ED due to outpatient labs showing platelet count of 15k.  ED physician consulted Dr. Cherly Hensen with heme-onc who recommended giving 1 unit platelets now and trending CBC daily.  Recommended continuing to hold Eliquis if platelet count <50k or bleeding.  Since patient was found to have deficiency of B12, folate, and iron on previous labs, heme-onc recommended repeating testing and replacing as needed.  Anemia panel ordered.  [facility] called to admit.      Assessment/Plan:   Thrombocytopenia:  Chronic, in the setting of lupus anticoagulant and PE and Eliquis use. No bleeding today.  Patient was  on Eliquis and last dose was 3/3.   Heme onc consulted and recommended giving 1 unit of platelets and trending CBC daily.  Recommended holding Eliquis if platelet count <50k or bleeding.  Recommend to restart Eliquis 2.5 mg p.o. twice daily once platelet count more than 50,000.  And then transition to 5 mg p.o. twice daily. Ferritin is 8, folate 25.1 and and vitamin B12 is 1198, iron level is 34.  She is status post 2 iron infusions in the hospital. S/p 1 unit of platelet transfusion.  No bleeding noted.    Oncology on board. Recommended  another dose of IV  venofer,  IV Immuno globulins and IV solumedrol for 2 doses to see if the platelets improve.  Platelets improved from 29,000 to 40,000 yesterday -Labs are pending for today   Hypokalemia Replaced. Keep K >4.      Hypomagnesemia Replaced. Repeat level wnl.    Iron deficiency anemia S/p 1 unit of prbc transfusion followed by 2 doses of IV iron infusion.  Monitor counts.    H/o Lupus anti coagulant, H/o PE, h/o recent large left MCA stroke Eliquis held due to thrombocytopenia.  S/p G tube ( not using it) .  Of note patient reports brownish fluid from G tube earlier today. Requesting the G tube to be removed.  Discussed with IR, confirmed with patient that she does not want gastrostomy tube.  Has not been using it.  IR has been consulted for removal of tube.  Plan to remove tube today.  Tube was  placed on 03/21/23. Her H&h remains stable .  Abd x ray showed moderate stool burden     Mood Disorder and Chronic pain syndrome:  Resume home meds.    H/o MCA stroke with residual right sided weakness.  Recently discharged on 2/29 from [facility].  No new complaints.     Constipation Resume colace, added miralax.  -Abdominal x-ray showed moderate stool burden   H/o Tobacco abuse; Counseling given.    Mild hyponatremia Monitor.     Medications     sodium chloride   Intravenous Once   clonazePAM  0.5 mg Oral QHS  docusate sodium  100 mg Oral TID   FLUoxetine  10 mg Oral Daily   folic acid  1 mg Oral Daily   gabapentin  100 mg Oral TID   iron polysaccharides  150 mg Oral Daily   midodrine  2.5 mg Oral TID WC   multivitamin with minerals  1 tablet Oral Daily   polyethylene glycol  17 g Oral BID     Data Reviewed:   CBG:  No results for input(s): "GLUCAP" in the last 168 hours.  SpO2: 100 %    Vitals:   05/14/23 0309 05/14/23 0407 05/14/23 0527 05/14/23 0616  BP: 102/61 109/64 113/60 116/62  Pulse: 71 80 76 69   Resp: 18 18 16 18   Temp: 97.8 F (36.6 C) 97.8 F (36.6 C) 98.4 F (36.9 C) 98.8 F (37.1 C)  TempSrc: Oral Oral Oral Oral  SpO2: 100% 100% 100% 100%  Weight:      Height:          Data Reviewed:  Basic Metabolic Panel: Recent Labs  Lab 05/07/23 0834 05/08/23 0315 05/09/23 0329 05/12/23 0307 05/13/23 1046  NA 141 138 133* 136 134*  K 3.0* 3.9 3.6 3.7 3.8  CL 116* 108 104 107 106  CO2 18* 22 21* 21* 19*  GLUCOSE 73 94 90 92 119*  BUN 10 13 15 18 20   CREATININE 0.65 1.03* 0.83 0.83 0.75  CALCIUM 7.1* 8.6* 8.4* 8.7* 9.2  MG 1.4* 2.3  --   --   --     CBC: Recent Labs  Lab 05/09/23 0329 05/10/23 0322 05/11/23 1749 05/12/23 0307 05/13/23 1046  WBC 4.5 4.8 6.0 4.9 5.1  NEUTROABS 2.4 2.5 3.2 2.7 3.8  HGB 8.3* 8.9* 8.9* 8.8* 8.6*  HCT 25.4* 27.4* 26.9* 27.3* 26.7*  MCV 82.5 82.0 80.3 83.7 82.4  PLT 30* 28* 35* 29* 40*    LFT Recent Labs  Lab 05/13/23 1046  AST 16  ALT 9  ALKPHOS 57  BILITOT 0.5  PROT 8.3*  ALBUMIN 3.3*     Antibiotics: Anti-infectives (From admission, onward)    None        DVT prophylaxis: SCDs  Code Status: Full code  Family Communication: Discussed with patient's family member at bedside   CONSULTS oncology   Subjective   Denies any complaints.  Plan for gastrostomy tube removal today.   Objective    Physical Examination:   General-appears in no acute distress Heart-S1-S2, regular, no murmur auscultated Lungs-clear to auscultation bilaterally, no wheezing or crackles auscultated Abdomen-soft, nontender, no organomegaly Extremities-no edema in the lower extremities Neuro-alert, oriented x3, no focal deficit noted  Status is: Inpatient:             Meredeth Ide   [facility] If 7PM-7AM, please contact night-coverage at [email or web address], Office  [phone]   05/14/2023, 8:35 AM  LOS: 7 days

## 2023-05-14 NOTE — Progress Notes (Signed)
 PT Cancellation Note  Patient Details Name: Jacqueline [name] MRN: [id number] DOB: 02/08/2001   Cancelled Treatment:    Reason Eval/Treat Not Completed: Other (comment). Pt sleeping 1025 and later in am pt underwent IR gastrostomy tube removal. PT to return if schedule allows and to continue to follow acutely.   [name], PT Acute Rehab   [name] 05/14/2023, 11:25 AM

## 2023-05-14 NOTE — Plan of Care (Signed)
   Problem: Nutrition: Goal: Adequate nutrition will be maintained Outcome: Progressing   Problem: Pain Managment: Goal: General experience of comfort will improve and/or be controlled Outcome: Progressing   Problem: Safety: Goal: Ability to remain free from injury will improve Outcome: Progressing

## 2023-05-14 NOTE — Progress Notes (Signed)
 Phlebotomy notified - patient is agreeable to blood draw at this time.

## 2023-05-14 NOTE — Progress Notes (Addendum)
   05/14/23 1004  TOC Brief Assessment  Insurance and Status Reviewed  Patient has primary care physician Yes  Home environment has been reviewed home with family providing 24/7 support  Prior level of function: wheelchair level  Prior/Current Home Services Current home services  Social Drivers of Health Review SDOH reviewed no interventions necessary  Readmission risk has been reviewed Yes  Transition of care needs Will follow up to confirm if still active with [facility] services

## 2023-05-14 NOTE — Progress Notes (Addendum)
 Jacqueline [name]   DOB:09-29-2000   [id number]      ASSESSMENT & PLAN:  Acute on chronic thrombocytopenia - likely multifactorial.  Some component of drop due to consumption from significant menstrual bleeding. 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 No overt associated autoimmune condition at this time associated with the antiphospholipid antibody syndrome at this time.  -  Platelets improved from 29K-40K yesterday.  Platelet counts pending today. - Status post IVIG x2 doses, completed 05/13/23.   -S/p one unit platelets on 3/9. Recommend platelet transfusion for counts <20k or <50k with active bleeding. - Hematology/Dr. [name] following   Anemia IDA History of folate deficiency History of Menorrhagia - IDA due to menorrhagia which she has had prior to anticoagulation and now worse with Anticoagulation and thrombocytopenia. Currently periods are over.  - Status post prbc transfusions this admission  - Status post two IV iron infusions.  Additional dose of IV iron will be considered prior to d/c.   - continue folic acid and po iron. Needs aggressive IV iron replacement to maintain ferritin>100 and iron saturation >20%  - Hemoglobin results pending today. - Follow up with Ob/Gyn to help control periods. Was previously on Megace which might increase risk of bleeding. Also would not recommend systemic hormonal birth control pills due to high risk of VTE in the context of previous h/o PE and antiphospholipid antibody syndrome.  - Continue to monitor CBC with differential closely   Positive lupus anticoagulant History of unprovoked PE - Lupus anticoagulant was identified once in 2024. - Lupus anticoagulant persistently strongly positive and confirmed with Hexagonal phospholipid neutralization assay.  - remains at high risk of venous and arterial  thrombosis as well as bleeding if she were put on therapeutic anticoagulation at this time  - Status post Eliquis which was stopped in June 2024.  However was restarted due to recent stroke.  Last dose 05/05/2023.  Hold Eliquis at this time due to low platelets. - Initiated IVIG 1g/kg q24h x 2 doses with tylenol, loratadine and solumedrol as premedications. -if response will consider starting steroids with slow taper. -restart patient on Eliquis 2.5mg  po BID once platelets>50k and then transition to 5mg  po BID if platelets stable and menstrual bleeding controlled. -if menstrual bleeding cannot be controlled by reasonable ObGyn input then might need to consider Lupron or Goserelin to shut down periods for now. - Will likely need Prednisone 60mg  po daily with a slow taper and if platelets still low might need to consider Rituxan as outpatient.  - Patient has seen Dr. [name] before in 2024.  Reported history of noncompliance and not follow-up with further testing   History of stroke Expressive aphasia - Patient admitted with large MCA stroke, discharged 04/23/2023 with residual right-sided weakness.     - Noted difficulty with speech and word finding - Continue supportive care     Code Status Full   Subjective:  Patient seen asleep in bed with friend.  Patient did not want to be awakened, encouraged very strongly to have blood work done which she refused this morning.  Friend also encouraged her and she agreed.  RN aware and is ensuring labs are drawn.  Objective:  Vitals:   05/14/23 0527 05/14/23 0616  BP: 113/60 116/62  Pulse: 76 69  Resp: 16 18  Temp: 98.4 F (36.9 C) 98.8 F (37.1 C)  SpO2: 100% 100%     Intake/Output  Summary (Last 24 hours) at 05/14/2023 1038 Last data filed at 05/14/2023 0630 Gross per 24 hour  Intake 1480 ml  Output 2 ml  Net 1478 ml     REVIEW OF SYSTEMS:   Constitutional: Denies fevers, chills or abnormal night sweats Eyes: Denies blurriness of vision,  double vision or watery eyes Ears, nose, mouth, throat, and face: Denies mucositis or sore throat Respiratory: Denies cough, dyspnea or wheezes Cardiovascular: Denies palpitation, chest discomfort or lower extremity swelling Gastrointestinal:  Denies nausea, heartburn or change in bowel habits Skin: Denies abnormal skin rashes Lymphatics: Denies new lymphadenopathy or easy bruising Neurological: Denies numbness, tingling or new weaknesses Behavioral/Psych: Mood is stable, no new changes  All other systems were reviewed with the patient and are negative.  PHYSICAL EXAMINATION: ECOG PERFORMANCE STATUS: 3 - Symptomatic, >50% confined to bed  Vitals:   05/14/23 0527 05/14/23 0616  BP: 113/60 116/62  Pulse: 76 69  Resp: 16 18  Temp: 98.4 F (36.9 C) 98.8 F (37.1 C)  SpO2: 100% 100%   Filed Weights   05/07/23 1546  Weight: 171 lb 11.8 oz (77.9 kg)    GENERAL: alert, no distress and comfortable SKIN: skin color, texture, turgor are normal, no rashes or significant lesions EYES: normal, conjunctiva are pink and non-injected, sclera clear OROPHARYNX: no exudate, no erythema and lips, buccal mucosa, and tongue normal  NECK: supple, thyroid normal size, non-tender, without nodularity LYMPH: no palpable lymphadenopathy in the cervical, axillary or inguinal LUNGS: clear to auscultation and percussion with normal breathing effort HEART: regular rate & rhythm and no murmurs and no lower extremity edema ABDOMEN: abdomen soft, non-tender and normal bowel sounds MUSCULOSKELETAL: no cyanosis of digits and no clubbing  PSYCH: alert & oriented x 3 with fluent speech NEURO: no focal motor/sensory deficits   All questions were answered. The patient knows to call the clinic with any problems, questions or concerns.   The total time spent in the appointment was 50 minutes encounter with patient including review of chart and various tests results, discussions about plan of care and coordination  of care plan  Dawson Bills, NP 05/14/2023 10:38 AM    Labs Reviewed:  Lab Results  Component Value Date   WBC 5.1 05/13/2023   HGB 8.6 (L) 05/13/2023   HCT 26.7 (L) 05/13/2023   MCV 82.4 05/13/2023   PLT 40 (L) 05/13/2023   Recent Labs    03/28/23 1311 03/29/23 0640 04/04/23 2216 04/07/23 0537 05/09/23 0329 05/12/23 0307 05/13/23 1046  NA 140   < > 137   < > 133* 136 134*  K 3.3*   < > 3.3*   < > 3.6 3.7 3.8  CL 108   < > 107   < > 104 107 106  CO2 23   < > 20*   < > 21* 21* 19*  GLUCOSE 92   < > 110*   < > 90 92 119*  BUN 8   < > 13   < > 15 18 20   CREATININE 0.79   < > 0.88   < > 0.83 0.83 0.75  CALCIUM 8.8*   < > 8.1*   < > 8.4* 8.7* 9.2  GFRNONAA >60   < > >60   < > >60 >60 >60  PROT 6.7  --  6.2*  --   --   --  8.3*  ALBUMIN 2.3*  --  2.4*  --   --   --  3.3*  AST 65*  --  33  --   --   --  16  ALT 92*  --  35  --   --   --  9  ALKPHOS 91  --  78  --   --   --  57  BILITOT 0.6  --  0.3  --   --   --  0.5   < > = values in this interval not displayed.    Studies Reviewed:  DG Abd 1 View Result Date: 05/13/2023 CLINICAL DATA:  Abdominal pain EXAM: ABDOMEN - 1 VIEW COMPARISON:  04/16/2023 FINDINGS: The bowel gas pattern is normal. Moderate colonic stool burden. Gastrostomy catheter overlies its expected position within the epigastrium. No radio-opaque calculi or other significant radiographic abnormality are seen. IMPRESSION: 1. Moderate colonic stool burden. Electronically Signed   By: Helyn Numbers M.D.   On: 05/13/2023 23:19   MR BRAIN WO CONTRAST Result Date: 04/18/2023 CLINICAL DATA:  Neuro deficit, acute, stroke suspected. EXAM: MRI HEAD WITHOUT CONTRAST TECHNIQUE: Multiplanar, multiecho pulse sequences of the brain and surrounding structures were obtained without intravenous contrast. COMPARISON:  MRI of the brain February 27, 2023. FINDINGS: Brain: Large area of encephalomalacia and gliosis in the left frontotemporal parietal and insular region consistent  with known prior left MCA territory infarct. Within that area, multiple small focal areas of restricted diffusion are seen, suggestive acute on chronic process. Other areas of encephalomalacia and gliosis are seen in the left frontal lobe as well as in the left caudate and right ACA/MCA watershed region. Hemosiderin deposit is seen in left temporal lobe. No hydrocephalus, extra-axial collection mass lesion. Vascular: Normal flow voids. Skull and upper cervical spine: Diffuse decrease of the T1 signal of the visualized osseous structures, may represent red marrow reconversion Sinuses/Orbits: Trace mucosal thickening of paranasal sinuses and mastoids. The orbits are maintained. Other: None. IMPRESSION: 1. Large area of encephalomalacia and gliosis in the left frontotemporal parietal and insular region consistent with known prior left MCA territory infarct. Within that area, multiple small focal areas of restricted diffusion are seen, suggestive of acute on chronic process. 2. Other areas of encephalomalacia and gliosis in the left frontal lobe as well as in the left caudate and right ACA/MCA watershed region. Electronically Signed   By: Baldemar Lenis M.D.   On: 04/18/2023 12:52   CT HEAD CODE STROKE WO CONTRAST` Result Date: 04/18/2023 CLINICAL DATA:  Code stroke.  [place]iplopia EXAM: CT HEAD WITHOUT CONTRAST TECHNIQUE: Contiguous axial images were obtained from the base of the skull through the vertex without intravenous contrast. RADIATION DOSE REDUCTION: This exam was performed according to the departmental dose-optimization program which includes automated exposure control, adjustment of the mA and/or kV according to patient size and/or use of iterative reconstruction technique. COMPARISON:  Head CT 03/03/2023 FINDINGS: Brain: Evolving left MCA territory infarct, not significantly changed from prior exam. Negative for an acute infarct. No mass effect. No mass lesion. No CT evidence of an acute  cortical infarct Vascular: No hyperdense vessel or unexpected calcification. Skull: Normal. Negative for fracture or focal lesion. Sinuses/Orbits: No middle ear or mastoid effusion. Paranasal sinuses are clear. Orbits are unremarkable. Other: None. ASPECTS [facility] Stroke Program Early CT Score): 10 when accounting for chronic findings IMPRESSION: 1. No hemorrhage or CT evidence of an acute cortical infarct. 2. Evolving left MCA territory infarct, not significantly changed from prior exam. Findings were paged to Dr. Derry Lory on 04/18/23 at 11:43 AM. Electronically Signed  By: Lorenza Cambridge M.D.   On: 04/18/2023 11:43   DG Abd 1 View Result Date: 04/16/2023 CLINICAL DATA:  Midline abdominal pain EXAM: ABDOMEN - 1 VIEW COMPARISON:  03/29/2023 FINDINGS: Gastric tube again noted projecting over the left abdomen. Nonobstructive bowel gas pattern. No organomegaly or free air. Moderate stool burden. Visualized lung bases clear. IMPRESSION: No bowel obstruction or free air. Moderate stool burden. Electronically Signed   By: Charlett Nose M.D.   On: 04/16/2023 18:29   ADDENDUM  .Patient was Personally and independently interviewed, examined and relevant elements of the history of present illness were reviewed in details and an assessment and plan was created. All elements of the patient's history of present illness , assessment and plan were discussed in details with Hinton Dyer NP. The above documentation reflects our combined findings assessment and plan.   Tolerated IVIG x 2 well. PLT improved to 50k. No menstrual or other bleeding at this time. Patient at significant risk of CVA, VTE as well as bleeding if platelets dropped. Plan -will restart Eliquis at 2.5mg  po BID and increase to full dose of 5mg  po BID if platelets continue to improve and remain stable. -will start on Prednisone 60mg p o daily from tomorrow AM with taper over 3-4 weeks -outpatient hematology f/u with Dr [name] with labs in 1 week post  discharge. 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 -IV iron as needed to maintaining ferritin 200-250 -will need ObgYn f/u to address concerns for heavy menstrual bleeding. Could conisder Lupron or Goserelin if needed. -evaluation of discharge needs per hospital medicine.  Wyvonnia Lora MD MS

## 2023-05-14 NOTE — Procedures (Signed)
 Per order of primary team and pt request pt's 20 french balloon retention gastrostomy tube was removed in its entirety without immediate complications. Gauze dressing was applied to site. EBL none.

## 2023-05-15 DIAGNOSIS — Z86711 Personal history of pulmonary embolism: Secondary | ICD-10-CM | POA: Diagnosis not present

## 2023-05-15 DIAGNOSIS — E538 Deficiency of other specified B group vitamins: Secondary | ICD-10-CM

## 2023-05-15 DIAGNOSIS — D696 Thrombocytopenia, unspecified: Secondary | ICD-10-CM | POA: Diagnosis not present

## 2023-05-15 DIAGNOSIS — D6861 Antiphospholipid syndrome: Secondary | ICD-10-CM | POA: Diagnosis not present

## 2023-05-15 DIAGNOSIS — Z8673 Personal history of transient ischemic attack (TIA), and cerebral infarction without residual deficits: Secondary | ICD-10-CM | POA: Diagnosis not present

## 2023-05-15 LAB — CBC WITH DIFFERENTIAL/PLATELET
Abs Immature Granulocytes: 0.01 10*3/uL (ref 0.00–0.07)
Basophils Absolute: 0 10*3/uL (ref 0.0–0.1)
Basophils Relative: 1 %
Eosinophils Absolute: 0 10*3/uL (ref 0.0–0.5)
Eosinophils Relative: 0 %
HCT: 26.9 % — ABNORMAL LOW (ref 36.0–46.0)
Hemoglobin: 8.7 g/dL — ABNORMAL LOW (ref 12.0–15.0)
Immature Granulocytes: 0 %
Lymphocytes Relative: 41 %
Lymphs Abs: 2.6 10*3/uL (ref 0.7–4.0)
MCH: 27 pg (ref 26.0–34.0)
MCHC: 32.3 g/dL (ref 30.0–36.0)
MCV: 83.5 fL (ref 80.0–100.0)
Monocytes Absolute: 0.7 10*3/uL (ref 0.1–1.0)
Monocytes Relative: 11 %
Neutro Abs: 2.9 10*3/uL (ref 1.7–7.7)
Neutrophils Relative %: 47 %
Platelets: 55 10*3/uL — ABNORMAL LOW (ref 150–400)
RBC: 3.22 MIL/uL — ABNORMAL LOW (ref 3.87–5.11)
RDW: 15.8 % — ABNORMAL HIGH (ref 11.5–15.5)
WBC: 6.2 10*3/uL (ref 4.0–10.5)
nRBC: 0 % (ref 0.0–0.2)

## 2023-05-15 MED ORDER — MIDODRINE HCL 2.5 MG PO TABS: 2.5000 mg | ORAL_TABLET | Freq: Two times a day (BID) | ORAL | 0 refills | Status: AC

## 2023-05-15 MED ORDER — APIXABAN 2.5 MG PO TABS: 2.5000 mg | ORAL_TABLET | Freq: Two times a day (BID) | ORAL | 1 refills | Status: DC

## 2023-05-15 MED ORDER — PREDNISONE 20 MG PO TABS
60.0000 mg | ORAL_TABLET | Freq: Every day | ORAL | 0 refills | Status: DC
Start: 2023-05-16 — End: 2023-05-28

## 2023-05-15 MED ORDER — IRON SUCROSE 200 MG IVPB - SIMPLE MED
200.0000 mg | Freq: Once | Status: AC
  Administered 2023-05-15: 200 mg via INTRAVENOUS
  Filled 2023-05-15: qty 200

## 2023-05-15 NOTE — Progress Notes (Addendum)
 Jacqueline [name]   DOB:11-08-2000   [id number]      ASSESSMENT & PLAN:  Acute on chronic thrombocytopenia - likely multifactorial.  Some component of drop due to consumption from significant menstrual bleeding. 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 No overt associated autoimmune condition at this time associated with the antiphospholipid antibody syndrome at this time.  -  Platelets continuing to improve 55k today.  - Status post IVIG x2 doses, completed 05/13/23.   -S/p one unit platelets on 3/9. Recommend platelet transfusion for counts <20k or <50k with active bleeding. -Likely for discharge today.  Discussed with patient and friend Jacqueline [name] (sp?) importance of following up with outpatient hematology appointments and the importance of taking all her medications as prescribed.  Patient acknowledges with head nods and said yes.  Okay for discharge from hematology viewpoint. -Recommend close follow-up with Dr. [name] upon discharge.   Anemia IDA History of folate deficiency History of Menorrhagia - IDA due to menorrhagia which she has had prior to anticoagulation and now worse with Anticoagulation and thrombocytopenia. Currently periods are over.  - Status post prbc transfusions this admission  - Status post two IV iron infusions.  Additional dose of IV iron will be considered prior to d/c.   - continue folic acid and po iron. Needs aggressive IV iron replacement to maintain ferritin>100 and iron saturation >20%  - Hemoglobin stable 8.7 today - Follow up with Ob/Gyn to help control periods. Was previously on Megace which might increase risk of VTE. Also would not recommend systemic hormonal birth control pills due to high risk of VTE in the context of previous h/o PE and antiphospholipid antibody syndrome.    Positive lupus anticoagulant History of  unprovoked PE - Lupus anticoagulant was identified once in 2024. - Lupus anticoagulant persistently strongly positive and confirmed with Hexagonal phospholipid neutralization assay.  - remains at high risk of venous and arterial thrombosis as well as bleeding if she were put on therapeutic anticoagulation at this time  - Status post Eliquis which was stopped in June 2024.  However was restarted due to recent stroke.  Last dose 05/05/2023.   - Restarted Eliquis 2.5 mg po bid as platelets now over 50k. HOLD if platelets noted to be below 50k. - Recommend increase to full dose of 5mg  po BID if platelets continue to improve and remain stable.  - Initiated IVIG 1g/kg q24h x 2 doses with tylenol, loratadine and solumedrol as premedications. - Will likely need Prednisone 60mg  po daily with a slow taper and if platelets still low might need to consider Rituxan as outpatient.  - Patient has seen Dr. [name] before in 2024.  Reported history of noncompliance and not follow-up with further testing - -outpatient hematology f/u with Dr [name] with labs in 1 week post discharge. 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 -IV iron as needed to maintaining ferritin 200-250. ORDERED IV venofer x1 dose for today. -will need ObgYn f/u to address concerns for heavy menstrual bleeding. Could conisder Lupron or Goserelin if needed. -evaluation of discharge needs per hospital medicine.   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     - Noted difficulty with speech and word finding - Continue supportive care         Code Status Full  Subjective:  Patient seen asleep in  bed with significant other beside her.  Awakens easily.  Discussed that she may be going home and discussed importance of close hematology follow-up.  Patient expressed understanding by nodding her head and saying yes.   Significant other also states that he will help her in this process.  Objective:  Vitals:   05/14/23 2036 05/15/23 0519  BP: 106/71 (!) 105/59  Pulse: 71 75  Resp: 16 16  Temp: 98.9 F (37.2 C) 98.5 F (36.9 C)  SpO2: 100% 100%     Intake/Output Summary (Last 24 hours) at 05/15/2023 0857 Last data filed at 05/15/2023 0518 Gross per 24 hour  Intake 840 ml  Output --  Net 840 ml     REVIEW OF SYSTEMS:   Constitutional:  +difficulty ambulating, +expressive aphasia, denies fevers, chills or abnormal night sweats Eyes: Denies blurriness of vision, double vision or watery eyes Ears, nose, mouth, throat, and face: Denies mucositis or sore throat Respiratory: Denies cough, dyspnea or wheezes Cardiovascular: Denies palpitation, chest discomfort or lower extremity swelling Gastrointestinal:  Denies nausea, heartburn or change in bowel habits Skin: Denies abnormal skin rashes Lymphatics: Denies new lymphadenopathy or easy bruising Neurological: Denies numbness, tingling or new weaknesses Behavioral/Psych: Mood is stable, no new changes  All other systems were reviewed with the patient and are negative.  PHYSICAL EXAMINATION: ECOG PERFORMANCE STATUS: 2 - Symptomatic, <50% confined to bed  Vitals:   05/14/23 2036 05/15/23 0519  BP: 106/71 (!) 105/59  Pulse: 71 75  Resp: 16 16  Temp: 98.9 F (37.2 C) 98.5 F (36.9 C)  SpO2: 100% 100%   Filed Weights   05/07/23 1546  Weight: 171 lb 11.8 oz (77.9 kg)    GENERAL: alert, no distress and comfortableSKIN: skin color, texture, turgor are normal, no rashes or significant lesions EYES: normal, conjunctiva are pink and non-injected, sclera clear OROPHARYNX: no exudate, no erythema and lips, buccal mucosa, and tongue normal  NECK: supple, thyroid normal size, non-tender, without nodularity LYMPH: no palpable lymphadenopathy in the cervical, axillary or inguinal LUNGS: clear to auscultation and percussion with normal breathing  effort HEART: regular rate & rhythm and no murmurs and no lower extremity edema ABDOMEN: abdomen soft, non-tender and normal bowel sounds MUSCULOSKELETAL: no cyanosis of digits and no clubbing  PSYCH: alert & oriented x 3 with fluent speech NEURO: no focal motor/sensory deficits   All questions were answered. The patient knows to call the clinic with any problems, questions or concerns.   The total time spent in the appointment was 40 minutes encounter with patient including review of chart and various tests results, discussions about plan of care and coordination of care plan  Dawson Bills, NP 05/15/2023 8:57 AM    Labs Reviewed:  Lab Results  Component Value Date   WBC 6.2 05/15/2023   HGB 8.7 (L) 05/15/2023   HCT 26.9 (L) 05/15/2023   MCV 83.5 05/15/2023   PLT 55 (L) 05/15/2023   Recent Labs    03/28/23 1311 03/29/23 0640 04/04/23 2216 04/07/23 0537 05/09/23 0329 05/12/23 0307 05/13/23 1046  NA 140   < > 137   < > 133* 136 134*  K 3.3*   < > 3.3*   < > 3.6 3.7 3.8  CL 108   < > 107   < > 104 107 106  CO2 23   < > 20*   < > 21* 21* 19*  GLUCOSE 92   < > 110*   < > 90 92 119*  BUN 8   < > 13   < > 15 18 20   CREATININE 0.79   < > 0.88   < > 0.83 0.83 0.75  CALCIUM 8.8*   < > 8.1*   < > 8.4* 8.7* 9.2  GFRNONAA >60   < > >60   < > >60 >60 >60  PROT 6.7  --  6.2*  --   --   --  8.3*  ALBUMIN 2.3*  --  2.4*  --   --   --  3.3*  AST 65*  --  33  --   --   --  16  ALT 92*  --  35  --   --   --  9  ALKPHOS 91  --  78  --   --   --  57  BILITOT 0.6  --  0.3  --   --   --  0.5   < > = values in this interval not displayed.    Studies Reviewed:  IR GASTROSTOMY TUBE REMOVAL/REPAIR Result Date: 05/14/2023 INDICATION: Patient with history of gastrostomy tube placement on 03/21/2023; patient no longer using tube and request now received for gastrostomy tube removal EXAM: GASTROSTOMY TUBE REMOVAL MEDICATIONS: None ANESTHESIA/SEDATION: None CONTRAST:  None FLUOROSCOPY: None  COMPLICATIONS: None immediate. PROCEDURE: Informed consent was obtained from the patient after a thorough discussion of the procedural risks, benefits and alternatives. All questions were addressed. A timeout was performed prior to the initiation of the procedure. The retention balloon was decompressed, then the gastrostomy tube balloon and using manual traction the tube was removed in its entirety without immediate complications. Gauze dressing applied over site. IMPRESSION: Successful removal of a 20 Fr balloon-retention gastrostomy tube. Performed WU:[id number] Allred,PA-C Electronically Signed   By: Roanna Banning M.D.   On: 05/14/2023 11:43   DG Abd 1 View Result Date: 05/13/2023 CLINICAL DATA:  Abdominal pain EXAM: ABDOMEN - 1 VIEW COMPARISON:  04/16/2023 FINDINGS: The bowel gas pattern is normal. Moderate colonic stool burden. Gastrostomy catheter overlies its expected position within the epigastrium. No radio-opaque calculi or other significant radiographic abnormality are seen. IMPRESSION: 1. Moderate colonic stool burden. Electronically Signed   By: Helyn Numbers M.D.   On: 05/13/2023 23:19   MR BRAIN WO CONTRAST Result Date: 04/18/2023 CLINICAL DATA:  Neuro deficit, acute, stroke suspected. EXAM: MRI HEAD WITHOUT CONTRAST TECHNIQUE: Multiplanar, multiecho pulse sequences of the brain and surrounding structures were obtained without intravenous contrast. COMPARISON:  MRI of the brain February 27, 2023. FINDINGS: Brain: Large area of encephalomalacia and gliosis in the left frontotemporal parietal and insular region consistent with known prior left MCA territory infarct. Within that area, multiple small focal areas of restricted diffusion are seen, suggestive acute on chronic process. Other areas of encephalomalacia and gliosis are seen in the left frontal lobe as well as in the left caudate and right ACA/MCA watershed region. Hemosiderin deposit is seen in left temporal lobe. No hydrocephalus, extra-axial  collection mass lesion. Vascular: Normal flow voids. Skull and upper cervical spine: Diffuse decrease of the T1 signal of the visualized osseous structures, may represent red marrow reconversion Sinuses/Orbits: Trace mucosal thickening of paranasal sinuses and mastoids. The orbits are maintained. Other: None. IMPRESSION: 1. Large area of encephalomalacia and gliosis in the left frontotemporal parietal and insular region consistent with known prior left MCA territory infarct. Within that area, multiple small focal areas of restricted diffusion are seen, suggestive of acute on chronic process. 2. Other areas  of encephalomalacia and gliosis in the left frontal lobe as well as in the left caudate and right ACA/MCA watershed region. Electronically Signed   By: Baldemar Lenis M.D.   On: 04/18/2023 12:52   CT HEAD CODE STROKE WO CONTRAST` Result Date: 04/18/2023 CLINICAL DATA:  Code stroke.  [place] EXAM: CT HEAD WITHOUT CONTRAST TECHNIQUE: Contiguous axial images were obtained from the base of the skull through the vertex without intravenous contrast. RADIATION DOSE REDUCTION: This exam was performed according to the departmental dose-optimization program which includes automated exposure control, adjustment of the mA and/or kV according to patient size and/or use of iterative reconstruction technique. COMPARISON:  Head CT 03/03/2023 FINDINGS: Brain: Evolving left MCA territory infarct, not significantly changed from prior exam. Negative for an acute infarct. No mass effect. No mass lesion. No CT evidence of an acute cortical infarct Vascular: No hyperdense vessel or unexpected calcification. Skull: Normal. Negative for fracture or focal lesion. Sinuses/Orbits: No middle ear or mastoid effusion. Paranasal sinuses are clear. Orbits are unremarkable. Other: None. ASPECTS [facility] Stroke Program Early CT Score): 10 when accounting for chronic findings IMPRESSION: 1. No hemorrhage or CT evidence of an acute  cortical infarct. 2. Evolving left MCA territory infarct, not significantly changed from prior exam. Findings were paged to Dr. Derry Lory on 04/18/23 at 11:43 AM. Electronically Signed   By: Lorenza Cambridge M.D.   On: 04/18/2023 11:43   DG Abd 1 View Result Date: 04/16/2023 CLINICAL DATA:  Midline abdominal pain EXAM: ABDOMEN - 1 VIEW COMPARISON:  03/29/2023 FINDINGS: Gastric tube again noted projecting over the left abdomen. Nonobstructive bowel gas pattern. No organomegaly or free air. Moderate stool burden. Visualized lung bases clear. IMPRESSION: No bowel obstruction or free air. Moderate stool burden. Electronically Signed   By: Charlett Nose M.D.   On: 04/16/2023 18:29     ADDENDUM  .Patient was Personally and independently interviewed, examined and relevant elements of the history of present illness were reviewed in details and an assessment and plan was created. All elements of the patient's history of present illness , assessment and plan were discussed in details with Hinton Dyer NP. The above documentation reflects our combined findings assessment and plan.   Wyvonnia Lora MD MS

## 2023-05-15 NOTE — Discharge Summary (Signed)
 Physician Discharge Summary   Patient: Jacqueline [name] MRN: [id number] DOB: 10/05/2000  Admit date:     05/06/2023  Discharge date: 05/15/23  Discharge Physician: Meredeth Ide   PCP: [name], MD   Recommendations at discharge:   Follow-up oncology in 1 week Follow-up PCP as outpatient Midodrine 2.5 mg to be given for 10 days, can wean off this medication. Continue prednisone 60 mg daily till seen by oncology Dose of Eliquis changed to 2.5 mg p.o. twice daily.  Oncology to follow-up and increase dose once patient is stable.  Discharge Diagnoses: Principal Problem:   Thrombocytopenia (HCC) Active Problems:   Lupus anticoagulant disorder (HCC)   History of pulmonary embolism   Normocytic anemia   History of stroke   Symptomatic anemia   Antiphospholipid antibody syndrome (HCC)  Resolved Problems:   * No resolved hospital problems. *  Hospital Course: 23 y.o. 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  She was discharged from the hospital on 2/19.  Hospital course was also complicated by thrombocytopenia (etiology unclear) and menorrhagia for which she was seen by heme-onc and OB/GYN.  She was placed on Megace with ultrasound of the pelvis showing no definite abnormality.  She had a follow-up visit with her PCP today and was sent to the ED due to outpatient labs showing platelet count of 15k.  ED physician consulted Dr. Cherly Hensen with heme-onc who recommended giving 1 unit platelets now and trending CBC daily.  Recommended continuing to hold Eliquis if platelet count <50k or bleeding.  Since patient was found to have deficiency of B12, folate, and iron on previous labs, heme-onc recommended repeating  testing and replacing as needed.  Anemia panel ordered.  [facility] called to admit.    Assessment and Plan:  Thrombocytopenia:  Chronic, in the setting of lupus anticoagulant and PE and Eliquis use. Patient was  on Eliquis and last dose was 3/3.   Heme onc consulted and recommended giving 1 unit of platelets and trending CBC daily.  Recommended holding Eliquis if platelet count <50k or bleeding.  Recommend to restart Eliquis 2.5 mg p.o. twice daily once platelet count more than 50,000. Ferritin is 8, folate 25.1 and and vitamin B12 is 1198, iron level is 34.  She is status post 2 iron infusions in the hospital. S/p 1 unit of platelet transfusion.  No bleeding noted.   Oncology on board.  Patient has been getting IV Venofer and IV Solu-Medrol in the hospital.  Platelets improved from 29,000 to 55,000 -Oncology recommends discharge on prednisone 60 mg daily -Dose of Eliquis changed to 0.5 mg p.o. twice daily as per oncology recommendation.  Patient to follow-up with oncology in 1 week.   Hypokalemia Replete     Hypomagnesemia Replete   Iron deficiency anemia S/p 1 unit of prbc transfusion followed by 2 doses of IV iron infusion.     H/o Lupus anti coagulant, H/o PE, h/o recent large left MCA stroke Eliquis held due to thrombocytopenia.  S/p G tube ( not using it) .  Of note patient reports brownish fluid from G tube earlier today. Requesting the G tube to be removed.  Discussed with IR, confirmed with patient that she does not want gastrostomy tube.  Has not been using it.  IR has been consulted for removal  of tube.  .  Tube was  placed on 03/21/23. -G-tube was removed on 05/14/2023 as per patient wishes. Her H&h remains stable .  Abd x ray showed moderate stool burden     Mood Disorder and Chronic pain syndrome:  Resume home meds.    H/o MCA stroke with residual right sided weakness.  Recently discharged on 2/29 from [facility].  No new complaints.  On low-dose Eliquis as above      Constipation Resume colace, added miralax.     H/o Tobacco abuse; Counseling given.    Mild hyponatremia Improved, sodium 134          Consultants: Oncology Procedures performed:  Disposition: Home Diet recommendation:  Discharge Diet Orders (From admission, onward)     Start     Ordered   05/15/23 0000  Diet - low sodium heart healthy        05/15/23 1049           Regular diet DISCHARGE MEDICATION: Allergies as of 05/15/2023   No Known Allergies      Medication List     TAKE these medications    acetaminophen 325 MG tablet Commonly known as: TYLENOL Take 2 tablets (650 mg total) by mouth every 4 (four) hours as needed for mild pain (pain score 1-3) (or temp > 37.5 C (99.5 F)).   apixaban 2.5 MG Tabs tablet Commonly known as: ELIQUIS Take 1 tablet (2.5 mg total) by mouth 2 (two) times daily. What changed:  medication strength how much to take   clonazePAM 0.5 MG tablet Commonly known as: KLONOPIN Take 1 tablet (0.5 mg total) by mouth at bedtime.   cyanocobalamin 1000 MCG tablet Commonly known as: VITAMIN B12 Take 2 tablets (2,000 mcg total) by mouth daily.   docusate sodium 100 MG capsule Commonly known as: COLACE Take 1 capsule (100 mg total) by mouth 3 (three) times daily.   Ferrex 150 150 MG capsule Generic drug: iron polysaccharides Take 1 capsule (150 mg total) by mouth daily.   FLUoxetine 10 MG capsule Commonly known as: PROZAC Take 1 capsule by mouth daily.   folic acid 1 MG tablet Commonly known as: FOLVITE Take 1 tablet (1 mg total) by mouth daily.   gabapentin 100 MG capsule Commonly known as: NEURONTIN Take 1 capsule (100 mg total) by mouth 3 (three) times daily.   midodrine 2.5 MG tablet Commonly known as: PROAMATINE Take 1 tablet (2.5 mg total) by mouth 2 (two) times daily with a meal for 10 days.   multivitamin with minerals Tabs tablet Take 1 tablet by mouth daily.   naloxone 4 MG/0.1ML Liqd nasal spray  kit Commonly known as: NARCAN Place 1 spray into the nose as needed (opioid overdose).   oxyCODONE 5 MG immediate release tablet Commonly known as: Oxy IR/ROXICODONE Take 1 tablet (5 mg total) by mouth every 4 (four) hours as needed (perceived pain).   polyethylene glycol 17 g packet Commonly known as: MIRALAX / GLYCOLAX Take 17 g by mouth 2 (two) times daily.   predniSONE 20 MG tablet Commonly known as: DELTASONE Take 3 tablets (60 mg total) by mouth daily with breakfast for 14 days. Start taking on: May 16, 2023        Follow-up Information     [name], MD Follow up.   Specialty: Pediatrics Contact information: [address] [facility] [place] [address] [phone]         Johney Maine, MD.  Schedule an appointment as soon as possible for a visit in 1 week(s).   Specialties: Hematology, Oncology Contact information: [address] [place] [address] [phone]                Discharge Exam: Ceasar Mons Weights   05/07/23 1546  Weight: 77.9 kg   General-appears in no acute distress Heart-S1-S2, regular, no murmur auscultated Lungs-clear to auscultation bilaterally, no wheezing or crackles auscultated Abdomen-soft, nontender, no organomegaly Extremities-no edema in the lower extremities Neuro-alert, oriented x3, no focal deficit noted  Condition at discharge: good  The results of significant diagnostics from this hospitalization (including imaging, microbiology, ancillary and laboratory) are listed below for reference.   Imaging Studies: IR GASTROSTOMY TUBE REMOVAL/REPAIR Result Date: 05/14/2023 INDICATION: Patient with history of gastrostomy tube placement on 03/21/2023; patient no longer using tube and request now received for gastrostomy tube removal EXAM: GASTROSTOMY TUBE REMOVAL MEDICATIONS: None ANESTHESIA/SEDATION: None CONTRAST:  None FLUOROSCOPY: None COMPLICATIONS: None  immediate. PROCEDURE: Informed consent was obtained from the patient after a thorough discussion of the procedural risks, benefits and alternatives. All questions were addressed. A timeout was performed prior to the initiation of the procedure. The retention balloon was decompressed, then the gastrostomy tube balloon and using manual traction the tube was removed in its entirety without immediate complications. Gauze dressing applied over site. IMPRESSION: Successful removal of a 20 Fr balloon-retention gastrostomy tube. Performed CB:JSEGB Allred,PA-C Electronically Signed   By: Roanna Banning M.D.   On: 05/14/2023 11:43   DG Abd 1 View Result Date: 05/13/2023 CLINICAL DATA:  Abdominal pain EXAM: ABDOMEN - 1 VIEW COMPARISON:  04/16/2023 FINDINGS: The bowel gas pattern is normal. Moderate colonic stool burden. Gastrostomy catheter overlies its expected position within the epigastrium. No radio-opaque calculi or other significant radiographic abnormality are seen. IMPRESSION: 1. Moderate colonic stool burden. Electronically Signed   By: Helyn Numbers M.D.   On: 05/13/2023 23:19   MR BRAIN WO CONTRAST Result Date: 04/18/2023 CLINICAL DATA:  Neuro deficit, acute, stroke suspected. EXAM: MRI HEAD WITHOUT CONTRAST TECHNIQUE: Multiplanar, multiecho pulse sequences of the brain and surrounding structures were obtained without intravenous contrast. COMPARISON:  MRI of the brain February 27, 2023. FINDINGS: Brain: Large area of encephalomalacia and gliosis in the left frontotemporal parietal and insular region consistent with known prior left MCA territory infarct. Within that area, multiple small focal areas of restricted diffusion are seen, suggestive acute on chronic process. Other areas of encephalomalacia and gliosis are seen in the left frontal lobe as well as in the left caudate and right ACA/MCA watershed region. Hemosiderin deposit is seen in left temporal lobe. No hydrocephalus, extra-axial collection mass  lesion. Vascular: Normal flow voids. Skull and upper cervical spine: Diffuse decrease of the T1 signal of the visualized osseous structures, may represent red marrow reconversion Sinuses/Orbits: Trace mucosal thickening of paranasal sinuses and mastoids. The orbits are maintained. Other: None. IMPRESSION: 1. Large area of encephalomalacia and gliosis in the left frontotemporal parietal and insular region consistent with known prior left MCA territory infarct. Within that area, multiple small focal areas of restricted diffusion are seen, suggestive of acute on chronic process. 2. Other areas of encephalomalacia and gliosis in the left frontal lobe as well as in the left caudate and right ACA/MCA watershed region. Electronically Signed   By: Baldemar Lenis M.D.   On: 04/18/2023 12:52   CT HEAD CODE STROKE WO CONTRAST` Result Date: 04/18/2023 CLINICAL DATA:  Code stroke.  Diplopia EXAM:  CT HEAD WITHOUT CONTRAST TECHNIQUE: Contiguous axial images were obtained from the base of the skull through the vertex without intravenous contrast. RADIATION DOSE REDUCTION: This exam was performed according to the departmental dose-optimization program which includes automated exposure control, adjustment of the mA and/or kV according to patient size and/or use of iterative reconstruction technique. COMPARISON:  Head CT 03/03/2023 FINDINGS: Brain: Evolving left MCA territory infarct, not significantly changed from prior exam. Negative for an acute infarct. No mass effect. No mass lesion. No CT evidence of an acute cortical infarct Vascular: No hyperdense vessel or unexpected calcification. Skull: Normal. Negative for fracture or focal lesion. Sinuses/Orbits: No middle ear or mastoid effusion. Paranasal sinuses are clear. Orbits are unremarkable. Other: None. ASPECTS [facility] Stroke Program Early CT Score): 10 when accounting for chronic findings IMPRESSION: 1. No hemorrhage or CT evidence of an acute cortical  infarct. 2. Evolving left MCA territory infarct, not significantly changed from prior exam. Findings were paged to Dr. Derry Lory on 04/18/23 at 11:43 AM. Electronically Signed   By: Lorenza Cambridge M.D.   On: 04/18/2023 11:43   DG Abd 1 View Result Date: 04/16/2023 CLINICAL DATA:  Midline abdominal pain EXAM: ABDOMEN - 1 VIEW COMPARISON:  03/29/2023 FINDINGS: Gastric tube again noted projecting over the left abdomen. Nonobstructive bowel gas pattern. No organomegaly or free air. Moderate stool burden. Visualized lung bases clear. IMPRESSION: No bowel obstruction or free air. Moderate stool burden. Electronically Signed   By: Charlett Nose M.D.   On: 04/16/2023 18:29    Microbiology: Results for orders placed or performed during the hospital encounter of 03/25/23  Culture, blood (Routine X 2) w Reflex to ID Panel     Status: Abnormal   Collection Time: 03/26/23  2:10 AM   Specimen: BLOOD  Result Value Ref Range Status   Specimen Description BLOOD BLOOD LEFT HAND  Final   Special Requests   Final    BOTTLES DRAWN AEROBIC AND ANAEROBIC Blood Culture adequate volume   Culture  Setup Time   Final    GRAM POSITIVE COCCI IN CLUSTERS IN BOTH AEROBIC AND ANAEROBIC BOTTLES CRITICAL RESULT CALLED TO, READ BACK BY AND VERIFIED WITH: PHARMD G ABBOTT 03/27/2023 @ 0515 BY AB    Culture (A)  Final    STAPHYLOCOCCUS EPIDERMIDIS STAPHYLOCOCCUS CAPITIS THE SIGNIFICANCE OF ISOLATING THIS ORGANISM FROM A SINGLE SET OF BLOOD CULTURES WHEN MULTIPLE SETS ARE DRAWN IS UNCERTAIN. PLEASE NOTIFY THE MICROBIOLOGY DEPARTMENT WITHIN ONE WEEK IF SPECIATION AND SENSITIVITIES ARE REQUIRED. Performed at [facility], [address] [place], [place] [address]    Report Status 03/29/2023 FINAL  Final   Organism ID, Bacteria STAPHYLOCOCCUS EPIDERMIDIS  Final      Susceptibility   Staphylococcus epidermidis - MIC*    CIPROFLOXACIN >=8 RESISTANT Resistant     ERYTHROMYCIN >=8 RESISTANT Resistant     GENTAMICIN <=0.5  SENSITIVE Sensitive     OXACILLIN >=4 RESISTANT Resistant     TETRACYCLINE 2 SENSITIVE Sensitive     VANCOMYCIN <=0.5 SENSITIVE Sensitive     TRIMETH/SULFA 160 RESISTANT Resistant     CLINDAMYCIN >=8 RESISTANT Resistant     RIFAMPIN <=0.5 SENSITIVE Sensitive     Inducible Clindamycin NEGATIVE Sensitive     * STAPHYLOCOCCUS EPIDERMIDIS  Blood Culture ID Panel (Reflexed)     Status: Abnormal   Collection Time: 03/26/23  2:10 AM  Result Value Ref Range Status   Enterococcus faecalis NOT DETECTED NOT DETECTED Final   Enterococcus Faecium NOT DETECTED  NOT DETECTED Final   Listeria monocytogenes NOT DETECTED NOT DETECTED Final   Staphylococcus species DETECTED (A) NOT DETECTED Final    Comment: CRITICAL RESULT CALLED TO, READ BACK BY AND VERIFIED WITH: PHARMD G ABBOTT 03/27/2023 @ 0515 BY AB    Staphylococcus aureus (BCID) NOT DETECTED NOT DETECTED Final   Staphylococcus epidermidis DETECTED (A) NOT DETECTED Final    Comment: Methicillin (oxacillin) resistant coagulase negative staphylococcus. Possible blood culture contaminant (unless isolated from more than one blood culture draw or clinical case suggests pathogenicity). No antibiotic treatment is indicated for blood  culture contaminants. CRITICAL RESULT CALLED TO, READ BACK BY AND VERIFIED WITH: PHARMD G ABBOTT 03/27/2023 @ 0515 BY AB    Staphylococcus lugdunensis NOT DETECTED NOT DETECTED Final   Streptococcus species NOT DETECTED NOT DETECTED Final   Streptococcus agalactiae NOT DETECTED NOT DETECTED Final   Streptococcus pneumoniae NOT DETECTED NOT DETECTED Final   Streptococcus pyogenes NOT DETECTED NOT DETECTED Final   A.calcoaceticus-baumannii NOT DETECTED NOT DETECTED Final   Bacteroides fragilis NOT DETECTED NOT DETECTED Final   Enterobacterales NOT DETECTED NOT DETECTED Final   Enterobacter cloacae complex NOT DETECTED NOT DETECTED Final   Escherichia coli NOT DETECTED NOT DETECTED Final   Klebsiella aerogenes NOT DETECTED  NOT DETECTED Final   Klebsiella oxytoca NOT DETECTED NOT DETECTED Final   Klebsiella pneumoniae NOT DETECTED NOT DETECTED Final   Proteus species NOT DETECTED NOT DETECTED Final   Salmonella species NOT DETECTED NOT DETECTED Final   Serratia marcescens NOT DETECTED NOT DETECTED Final   Haemophilus influenzae NOT DETECTED NOT DETECTED Final   Neisseria meningitidis NOT DETECTED NOT DETECTED Final   Pseudomonas aeruginosa NOT DETECTED NOT DETECTED Final   Stenotrophomonas maltophilia NOT DETECTED NOT DETECTED Final   Candida albicans NOT DETECTED NOT DETECTED Final   Candida auris NOT DETECTED NOT DETECTED Final   Candida glabrata NOT DETECTED NOT DETECTED Final   Candida krusei NOT DETECTED NOT DETECTED Final   Candida parapsilosis NOT DETECTED NOT DETECTED Final   Candida tropicalis NOT DETECTED NOT DETECTED Final   Cryptococcus neoformans/gattii NOT DETECTED NOT DETECTED Final   Methicillin resistance mecA/C DETECTED (A) NOT DETECTED Final    Comment: CRITICAL RESULT CALLED TO, READ BACK BY AND VERIFIED WITH: PHARMD G ABBOTT 03/27/2023 @ 0515 BY AB Performed at [facility], [address], [place], [place] [address]   Culture, blood (Routine X 2) w Reflex to ID Panel     Status: Abnormal   Collection Time: 03/26/23  2:13 AM   Specimen: BLOOD  Result Value Ref Range Status   Specimen Description BLOOD BLOOD LEFT ARM  Final   Special Requests   Final    BOTTLES DRAWN AEROBIC AND ANAEROBIC Blood Culture adequate volume   Culture  Setup Time   Final    GRAM POSITIVE COCCI AEROBIC BOTTLE ONLY CRITICAL VALUE NOTED.  VALUE IS CONSISTENT WITH PREVIOUSLY REPORTED AND CALLED VALUE. Performed at [facility], [address], [place], [place] [address]    Culture STAPHYLOCOCCUS EPIDERMIDIS (A)  Final   Report Status 03/29/2023 FINAL  Final   Organism ID, Bacteria STAPHYLOCOCCUS EPIDERMIDIS  Final      Susceptibility   Staphylococcus epidermidis - MIC*    CIPROFLOXACIN >=8  RESISTANT Resistant     ERYTHROMYCIN >=8 RESISTANT Resistant     GENTAMICIN <=0.5 SENSITIVE Sensitive     OXACILLIN >=4 RESISTANT Resistant     TETRACYCLINE 2 SENSITIVE Sensitive     VANCOMYCIN <=  0.5 SENSITIVE Sensitive     TRIMETH/SULFA 160 RESISTANT Resistant     CLINDAMYCIN >=8 RESISTANT Resistant     RIFAMPIN <=0.5 SENSITIVE Sensitive     Inducible Clindamycin NEGATIVE Sensitive     * STAPHYLOCOCCUS EPIDERMIDIS  Culture, blood (Routine X 2) w Reflex to ID Panel     Status: None   Collection Time: 03/28/23 12:50 PM   Specimen: BLOOD  Result Value Ref Range Status   Specimen Description BLOOD SITE NOT SPECIFIED  Final   Special Requests   Final    BOTTLES DRAWN AEROBIC AND ANAEROBIC Blood Culture results may not be optimal due to an inadequate volume of blood received in culture bottles   Culture   Final    NO GROWTH 5 DAYS Performed at [facility], [address] [place], [place] [address]    Report Status 04/02/2023 FINAL  Final  Culture, blood (Routine X 2) w Reflex to ID Panel     Status: None   Collection Time: 03/28/23  1:11 PM   Specimen: BLOOD  Result Value Ref Range Status   Specimen Description BLOOD SITE NOT SPECIFIED  Final   Special Requests   Final    BOTTLES DRAWN AEROBIC AND ANAEROBIC Blood Culture results may not be optimal due to an inadequate volume of blood received in culture bottles   Culture   Final    NO GROWTH 5 DAYS Performed at [facility], [address] [place], [place] [address]    Report Status 04/02/2023 FINAL  Final  Urine Culture (for pregnant, neutropenic or urologic patients or patients with an indwelling urinary catheter)     Status: Abnormal   Collection Time: 04/02/23  7:32 AM   Specimen: Urine, Clean Catch  Result Value Ref Range Status   Specimen Description URINE, CLEAN CATCH  Final   Special Requests NONE  Final   Culture (A)  Final    <10,000 COLONIES/mL INSIGNIFICANT GROWTH Performed at [facility],  [address], [place], [place] [address]    Report Status 04/03/2023 FINAL  Final    Labs: CBC: Recent Labs  Lab 05/11/23 1749 05/12/23 0307 05/13/23 1046 05/14/23 1319 05/15/23 0320  WBC 6.0 4.9 5.1 6.6 6.2  NEUTROABS 3.2 2.7 3.8 4.4 2.9  HGB 8.9* 8.8* 8.6* 8.6* 8.7*  HCT 26.9* 27.3* 26.7* 26.1* 26.9*  MCV 80.3 83.7 82.4 82.1 83.5  PLT 35* 29* 40* 50* 55*   Basic Metabolic Panel: Recent Labs  Lab 05/09/23 0329 05/12/23 0307 05/13/23 1046  NA 133* 136 134*  K 3.6 3.7 3.8  CL 104 107 106  CO2 21* 21* 19*  GLUCOSE 90 92 119*  BUN 15 18 20   CREATININE 0.83 0.83 0.75  CALCIUM 8.4* 8.7* 9.2   Liver Function Tests: Recent Labs  Lab 05/13/23 1046  AST 16  ALT 9  ALKPHOS 57  BILITOT 0.5  PROT 8.3*  ALBUMIN 3.3*   CBG: No results for input(s): "GLUCAP" in the last 168 hours.  Discharge time spent: greater than 30 minutes.  Signed: Meredeth Ide, MD [facility] 05/15/2023

## 2023-05-15 NOTE — TOC Transition Note (Signed)
 Transition of Care [facility]) - Discharge Note   Patient Details  Name: Jacqueline [name] MRN: [id number] Date of Birth: 01-19-2001  [facility] CM/SW Contact:  Amada Jupiter, LCSW Phone Number: 05/15/2023, 1:10 PM   Clinical Narrative:     Pt medically cleared for dc home today.  Reviewed with pt the note from PT that OP therapy was being arranged by pt/ family. Pt and mother note they do need assistance with this.  I have made referral to [facility] and they should reach out to mother to schedule appointments.  Pt has needed DME already in the home.  No further TOC needs.  Final next level of care: OP Rehab Barriers to Discharge: Barriers Resolved   Patient Goals and CMS Choice Patient states their goals for this hospitalization and ongoing recovery are:: return home          Discharge Placement                       Discharge Plan and Services Additional resources added to the After Visit Summary for                  DME Arranged: N/A DME Agency: NA                  Social Drivers of Health (SDOH) Interventions SDOH Screenings   Food Insecurity: No Food Insecurity (05/07/2023)  Housing: Low Risk  (05/07/2023)  Transportation Needs: No Transportation Needs (05/07/2023)  Utilities: Not At Risk (05/07/2023)  Financial Resource Strain: Not on File (06/21/2021)   Received from [place], [place]  Physical Activity: Not on File (06/21/2021)   Received from [place], [place]  Social Connections: Not on File (11/16/2022)   Received from [facility]  Stress: Not on File (06/21/2021)   Received from [place], [place]  Tobacco Use: High Risk (05/07/2023)     Readmission Risk Interventions    05/09/2023    1:23 PM  Readmission Risk Prevention Plan  Transportation Screening Complete  PCP or Specialist Appt within 3-5 Days Complete  HRI or Home Care Consult Complete  Social Work Consult for Recovery Care Planning/Counseling Complete  Palliative Care Screening Not Applicable   Medication Review Oceanographer) Complete

## 2023-05-15 NOTE — Progress Notes (Signed)
 Discharge paperwork printed and reviewed with the pt and family. No concerns made and verbalized understanding. Family to provide transportation. Wheelchair and belongings provided to the pt.

## 2023-05-15 NOTE — Plan of Care (Signed)
   Problem: Coping: Goal: Level of anxiety will decrease Outcome: Progressing   Problem: Pain Managment: Goal: General experience of comfort will improve and/or be controlled Outcome: Progressing   Problem: Safety: Goal: Ability to remain free from injury will improve Outcome: Progressing

## 2023-05-21 NOTE — Progress Notes (Signed)
 Subjective:    Patient ID: Jacqueline [name], female    DOB: January 17, 2001, 23 y.o.   MRN: [id number]  HPI: Jacqueline [name] is a 23 y.o. female who is here if HFU appointment for F/U of her  Left Middle Cerebral artery stroke , Expressive aphasia and Thrombocytopenia. She was brought to [facility] via EMS on 02/21/2024, with expressive aphasia.   Dr Darlyn Read H&P: 02/21/2024 Jacqueline [name] is a 23 y.o. 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  Patient's brother called EMS after talking to the patient on the phone and had concerns due to the changes in her speech.   Patient admitted to drinking a significant amount of alcohol last night and she did take Suboxone that is not prescribed to her. On arrival on exam at bridge, patient had expressive and receptive aphasia with word finding difficulty.  She was able to express herself but did have significant delay and had difficulty following commands and repeating words.  She had no vision deficit, focal weakness, sensory deficit or evidence of neglect.  CT Head: WO Contrast:  IMPRESSION: 1. Mild loss of gray-Heggs differentiation left parietotemporal region may represent left MCA territory acute infarct or artifact given streak artifact through this region. Recommend MRI to further evaluate. 2. No acute hemorrhage.  CTA:  MPRESSION: 1. 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 2. Approximately 21 mL of reported mismatch/penumbra in the left parietotemporal region, which correlates with findings on same day CT head and the above occluded vessel. 3. Given the above findings, the area of possible infarct on CT head is felt to be real. Therefore, the area of mismatch/penumbra may be overestimated. An MRI could better characterize the extent of acute infarct if clinically  warranted.  MR: Brain WO Contrast:  IMPRESSION: 1. Fairly extensive acute left MCA territory and watershed territory infarcts within the left cerebral hemisphere as described. Prominent susceptibility-weighted signal loss along the course of an M2 left middle cerebral artery vessel, which may reflect slow flow within this vessel or vessel occlusion. 2. Small acute right frontoparietal cortical/subcortical infarcts also present. 3. Abnormal T1 hypointense marrow signal within the calvarium and within visualized portions of the cervical spine. While this finding can reflect a marrow infiltrative process, the most common causes include chronic anemia, smoking and obesity.  Per Dr Pearlean Brownie Note:  She went to IR for mechanical thrombectomy, but this was unfortunately unsuccessful.  Stroke:  left MCA territory infarct s/p unsuccessful IR with extension, etiology: likely related to hypercoagulability from positive lupus anticoagulant not on [facility] Code Stroke CT head left parietotemporal \\infarct  ASPECTS 7-8 CTA head & neck occluded left proximal to mid M2 MCA CT perfusion 21 mm penumbra in left parietotemporal region S/p IR - initially TICI3, but progressive worsening stenosis with subsequent complete closure of left M2. Intermittent mild improvement in cailber with verapamil and nitroglycerine.  MRI acute left MCA territory infarct without hemorrhagic conversion CT repeat likely stroke extension in the anterior inferior right frontal lobe and right cerebellum MRI repeat showed stroke extension but no hemorrhagic conversion. MLS mild < 3mm.  CT repeat 12/28 showed stable large right MCA and small left MCA/ACA infarcts. MLS 2-29mm CT repeat 12/30 expected evolutionary changes in the left posterior division left MCA and left ACA/MCA border zone infarcts.  2D Echo EF 60  to 65%.  Left atrium size normal. EEG severe encephalopathy, no seizure LDL 42 HgbA1c 4.4 UDS neg VTE prophylaxis -SCDs No  antithrombotic prior to admission, now on heparin IV.  Therapy recommendations:  CIR  Jacqueline [name] was admitted to inpatient Rehabilitation on 03/25/2023 and discharged to her sisters home on 04/23/2023. She has a scheduled appointment with Neuro Outpatient Therapy today.   Jacqueline [name] denies any pain . She rates her pain 0. She has expressive aphasia, sister states her appetite is fair.   Jacqueline [name] arrived to office in wheelchair .   Jacqueline [name] was admitted to [facility] on 05/06/2023 and discharged on 05/15/2023, she was admitted with Throbocytopenia, discharged summary was reviewed.   Sister and family in room.   Pain Inventory Average Pain 0 Pain Right Now 0 My pain is  No pain   LOCATION OF PAIN  No pain  BOWEL Number of stools per week: 14 Oral laxative use No  Type of laxative none Enema or suppository use No  History of colostomy No  Incontinent No   BLADDER Normal    Mobility walk with assistance use a cane ability to climb steps?  no do you drive?  no use a wheelchair needs help with transfers Do you have any goals in this area?  yes  Function disabled: date disabled Has paperwork to apply.  I need assistance with the following:  dressing, bathing, toileting, meal prep, household duties, and shopping Do you have any goals in this area?  yes  Neuro/Psych trouble walking  Prior Studies Any changes since last visit?  no  Physicians involved in your care Any changes since last visit?  no   Family History  Problem Relation Age of Onset   Hypertension Maternal Grandmother    Diabetes Maternal Grandmother    Cancer Neg Hx    Social History   Socioeconomic History   Marital status: Single    Spouse name: Not on file   Number of children: Not on file   Years of education: Not on file   Highest education level: Not on file  Occupational History   Not on file  Tobacco Use   Smoking status: Some Days    Types: Cigars   Smokeless tobacco: Never    Tobacco comments:    1 B&M per day  Vaping Use   Vaping status: Never Used  Substance and Sexual Activity   Alcohol use: Yes   Drug use: No   Sexual activity: Yes    Partners: Male    Birth control/protection: None  Other Topics Concern   Not on file  Social History Narrative   [phone]- patient's confidential cell   Social Drivers of Health   Financial Resource Strain: Not on File (06/21/2021)   Received from [employer], [employer]    Financial Resource Strain: 0  Food Insecurity: No Food Insecurity (05/07/2023)   Hunger Vital Sign    Worried About Running Out of Food in the Last Year: Never true    Ran Out of Food in the Last Year: Never true  Transportation Needs: No Transportation Needs (05/07/2023)   PRAPARE - Administrator, Civil Service (Medical): No    Lack of Transportation (Non-Medical): No  Physical Activity: Not on File (06/21/2021)   Received from [place], [place]   Physical Activity    Physical Activity: 0  Stress: Not on File (06/21/2021)   Received from [place], [place]   Stress  Stress: 0  Social Connections: Not on File (11/16/2022)   Received from [facility]   Social Connections    Connectedness: 0   Past Surgical History:  Procedure Laterality Date   CESAREAN SECTION N/A 09/04/2016   Procedure: CESAREAN SECTION;  Surgeon: Levie Heritage, DO;  Location: [facility] BIRTHING SUITES;  Service: Obstetrics;  Laterality: N/A;   CESAREAN SECTION Bilateral    IR CT HEAD LTD  02/21/2023   IR GASTROSTOMY TUBE MOD SED  03/21/2023   IR GASTROSTOMY TUBE REMOVAL  05/14/2023   IR PERCUTANEOUS ART THROMBECTOMY/INFUSION INTRACRANIAL INC DIAG ANGIO  02/21/2023   IR REPLC GASTRO/COLONIC TUBE PERCUT W/FLUORO  03/27/2023   RADIOLOGY WITH ANESTHESIA N/A 02/21/2023   Procedure: IR WITH ANESTHESIA;  Surgeon: Julieanne Cotton, MD;  Location: [facility] OR;  Service: Radiology;  Laterality: N/A;   Past Medical History:  Diagnosis Date   Bacterial vaginitis 07/10/2016    Candida vaginitis 07/24/2016   Headache in pregnancy, antepartum, third trimester 07/24/2016   Kell isoimmunization during pregnancy    There were no vitals taken for this visit.  Opioid Risk Score:   Fall Risk Score:  `1  Depression screen PHQ 2/9      No data to display          Review of Systems  Musculoskeletal:  Positive for gait problem.  Neurological:  Positive for speech difficulty.  All other systems reviewed and are negative.      Objective:   Physical Exam Vitals and nursing note reviewed.  Constitutional:      Appearance: Normal appearance.  Cardiovascular:     Rate and Rhythm: Normal rate and regular rhythm.     Pulses: Normal pulses.     Heart sounds: Normal heart sounds.  Pulmonary:     Effort: Pulmonary effort is normal.     Breath sounds: Normal breath sounds.  Musculoskeletal:     Comments: Normal Muscle Bulk and Muscle Testing Reveals: Upper Extremities: Right: Paralysis Wearing Right hand splint  Left Upper Extremity: Full ROM and Muscle Strength 5/5  Lower Extremities: Right: Decreased ROM and Muscle Strength 4/5 Left Lower Extremity: Full ROM and Muscle Strength 5/5 Arrived in wheelchair     Skin:    General: Skin is warm and dry.  Neurological:     Mental Status: She is alert and oriented to person, place, and time.  Psychiatric:        Mood and Affect: Mood normal.        Behavior: Behavior normal.         Assessment & Plan:  Left Middle Cerebral artery stroke , Expressive aphasia : She has a scheduled appointment with Neuro- Rehabilitation. and Thrombocytopenia. She has a scheduled appointment with Neurology. Continue current medication regimen. Continue to Monitor.  Thrombocytopenia: She with F/U with Hematology. Continue to Monitor.   F/U with Dr. Carlis Abbott in 4- 6 weeks

## 2023-05-22 ENCOUNTER — Encounter: Admitting: Registered Nurse

## 2023-05-22 ENCOUNTER — Ambulatory Visit: Attending: Pediatrics

## 2023-05-22 ENCOUNTER — Encounter: Payer: Self-pay | Admitting: Registered Nurse

## 2023-05-22 VITALS — BP 95/63 | HR 79 | Ht 64.0 in

## 2023-05-22 VITALS — BP 106/60

## 2023-05-22 DIAGNOSIS — R262 Difficulty in walking, not elsewhere classified: Secondary | ICD-10-CM | POA: Diagnosis present

## 2023-05-22 DIAGNOSIS — I63512 Cerebral infarction due to unspecified occlusion or stenosis of left middle cerebral artery: Secondary | ICD-10-CM | POA: Diagnosis present

## 2023-05-22 DIAGNOSIS — R4701 Aphasia: Secondary | ICD-10-CM

## 2023-05-22 DIAGNOSIS — D696 Thrombocytopenia, unspecified: Secondary | ICD-10-CM | POA: Diagnosis not present

## 2023-05-22 DIAGNOSIS — R278 Other lack of coordination: Secondary | ICD-10-CM

## 2023-05-22 DIAGNOSIS — R293 Abnormal posture: Secondary | ICD-10-CM | POA: Diagnosis present

## 2023-05-22 DIAGNOSIS — M6281 Muscle weakness (generalized): Secondary | ICD-10-CM | POA: Diagnosis present

## 2023-05-22 DIAGNOSIS — R2681 Unsteadiness on feet: Secondary | ICD-10-CM | POA: Diagnosis present

## 2023-05-22 MED ORDER — GABAPENTIN 100 MG PO CAPS: 100.0000 mg | ORAL_CAPSULE | Freq: Three times a day (TID) | ORAL | 1 refills | Status: DC

## 2023-05-22 NOTE — Patient Instructions (Signed)
 My- Chart:   [phone]

## 2023-05-22 NOTE — Therapy (Unsigned)
 OUTPATIENT PHYSICAL THERAPY NEURO EVALUATION   Patient Name: Jacqueline [name] MRN: [id number] DOB:15-Oct-2000, 23 y.o., female 49 Date: 05/23/2023   PCP: [name], MD REFERRING PROVIDER: [name], MD  END OF SESSION:  PT End of Session - 05/22/23 1437     Visit Number 1    Number of Visits 13    Date for PT Re-Evaluation 07/04/23    Authorization Type [place] medicaid prepaid    PT Start Time 1435    PT Stop Time 1515    PT Time Calculation (min) 40 min    Equipment Utilized During Treatment Gait belt    Activity Tolerance Patient tolerated treatment well    Behavior During Therapy [facility] for tasks assessed/performed             Past Medical History:  Diagnosis Date   Bacterial vaginitis 07/10/2016   Candida vaginitis 07/24/2016   Headache in pregnancy, antepartum, third trimester 07/24/2016   Kell isoimmunization during pregnancy    Past Surgical History:  Procedure Laterality Date   CESAREAN SECTION N/A 09/04/2016   Procedure: CESAREAN SECTION;  Surgeon: Levie Heritage, DO;  Location: [facility] BIRTHING SUITES;  Service: Obstetrics;  Laterality: N/A;   CESAREAN SECTION Bilateral    IR CT HEAD LTD  02/21/2023   IR GASTROSTOMY TUBE MOD SED  03/21/2023   IR GASTROSTOMY TUBE REMOVAL  05/14/2023   IR PERCUTANEOUS ART THROMBECTOMY/INFUSION INTRACRANIAL INC DIAG ANGIO  02/21/2023   IR REPLC GASTRO/COLONIC TUBE PERCUT W/FLUORO  03/27/2023   RADIOLOGY WITH ANESTHESIA N/A 02/21/2023   Procedure: IR WITH ANESTHESIA;  Surgeon: Julieanne Cotton, MD;  Location: [facility] OR;  Service: Radiology;  Laterality: N/A;   Patient Active Problem List   Diagnosis Date Noted   Antiphospholipid antibody syndrome (HCC) 05/12/2023   Symptomatic anemia 05/07/2023   History of stroke 05/06/2023   Bacteremia 03/27/2023   SIRS (systemic inflammatory response syndrome) (HCC) 03/26/2023   Hyponatremia 03/26/2023   Ileus (HCC) 03/26/2023   Pain around PEG tube site, initial encounter 03/26/2023    Transaminitis 03/26/2023   History of pulmonary embolism 03/26/2023   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   Agitation 03/04/2023   Urinary tract infection without hematuria 03/04/2023   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/04/2023   Dysphagia 03/04/2023   Vitamin B12 deficiency 03/04/2023   Acute ischemic left MCA stroke (HCC) 02/21/2023   Middle cerebral artery embolism, left 02/21/2023   Community acquired pneumonia of right lower lobe of lung 04/10/2022   Iron deficiency anemia 04/09/2022   Folic acid deficiency 04/09/2022   Acute pulmonary embolism (HCC) 04/08/2022   Normocytic anemia 04/08/2022   Thrombocytopenia (HCC) 04/08/2022   Hypokalemia 04/08/2022   Moderate protein malnutrition (HCC) 04/08/2022   Nexplanon insertion 08/05/2017   PPH (postpartum hemorrhage) 08/04/2017   VBAC (vaginal birth after Cesarean) 08/04/2017   Abnormal fetal position 07/09/2017   Limited prenatal care in third trimester 07/09/2017   Anemia in pregnancy 06/02/2017   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   Thrombocytopenia affecting pregnancy, antepartum (HCC) 03/06/2017   Supervision of other normal pregnancy, antepartum 02/28/2017   Biological false positive RPR test 02/28/2017   H/O cesarean section 02/28/2017   Mild tetrahydrocannabinol (THC) abuse 04/25/2016   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   Maternal varicella, non-immune 02/08/2016    ONSET DATE: 05/15/23 referral  REFERRING DIAG: D69.6 (ICD-10-CM) - Thrombocytopenia (HCC)   THERAPY DIAG:  Abnormal posture - Plan: PT plan of care cert/re-cert  Difficulty in walking, not elsewhere  classified - Plan: PT plan of care cert/re-cert  Other lack of coordination - Plan: PT plan of care cert/re-cert  Muscle weakness (generalized) - Plan: PT plan of care cert/re-cert  Unsteadiness on feet - Plan: PT plan of care cert/re-cert  Rationale for Evaluation and Treatment: Rehabilitation  SUBJECTIVE:                                                                                                                                                                                              SUBJECTIVE STATEMENT: Patient arrives to clinic in K5 wheelchair and cousin. Cousin provides a majority of the information/answers to PT questions. She has a nurse aid come out M-F. She states that she never received HH therapies. Typically walks with aircast and swedish knee cage (received from CIR), though is mainly chair-bound at home.  Pt accompanied by: family member  PERTINENT HISTORY: kell isoimmunization during pregnancy, L MCA CVA  PAIN:  Are you having pain? No  PRECAUTIONS: Fall and Other: R hemiparesis, aphasic  WEIGHT BEARING RESTRICTIONS: No  FALLS: Has patient fallen in last 6 months? No  LIVING ENVIRONMENT: Lives with: lives with their family Lives in: House/apartment Stairs: No Has following equipment at home: Quad cane large base, Hemi walker, Wheelchair (manual), and bed side commode  PLOF: Needs assistance with ADLs, Needs assistance with homemaking, Needs assistance with gait, and Needs assistance with transfers  PATIENT GOALS: "to walk"   OBJECTIVE:  Note: Objective measures were completed at Evaluation unless otherwise noted.  DIAGNOSTIC FINDINGS: 04/18/23 brain MRI IMPRESSION: 1. Large area of encephalomalacia and gliosis in the left frontotemporal parietal and insular region consistent with known prior left MCA territory infarct. Within that area, multiple small focal areas of restricted diffusion are seen, suggestive of acute on chronic process. 2. Other areas of encephalomalacia and gliosis in the left frontal lobe as well as in the left caudate and right ACA/MCA watershed region.  COGNITION: Overall cognitive status: Impaired   SENSATION: WFL  COORDINATION: Normal L hemibody, unable R hemibody   POSTURE: rounded shoulders, forward head, increased thoracic  kyphosis, posterior pelvic tilt, flexed trunk , and weight shift right  LOWER EXTREMITY ROM:    -R ankle tendency toward inversion  LOWER EXTREMITY MMT:    MMT Right Eval Left Eval  Hip flexion 2-   Hip extension    Hip abduction 1   Hip adduction 1   Hip internal rotation    Hip external rotation    Knee flexion 1   Knee extension 2-   Ankle dorsiflexion 0   Ankle plantarflexion    Ankle inversion  Ankle eversion    (Blank rows = not tested)  BED MOBILITY:  Reportedly independent with rolling and sit <> supine on air mattress with foam overlay  TRANSFERS: Assistive device utilized: Hemi walker  Sit to stand: CGA Stand to sit: CGA Chair to chair: CGA  GAIT: Gait pattern: step through pattern, decreased arm swing- Right, decreased step length- Right, decreased stance time- Right, decreased hip/knee flexion- Right, decreased ankle dorsiflexion- Right, circumduction- Right, genu recurvatum- Right, lateral lean- Left, trunk rotated posterior- Right, wide BOS, and poor foot clearance- Right Distance walked: ~4ft Assistive device utilized: Hemi walker Level of assistance: CGA and Min A Comments: slow gait speed  FUNCTIONAL TESTS:    Formal Gait assessments deferred until patient has better fitting aircast                                                                                                                              TREATMENT N/a eval  PATIENT EDUCATION: Education details: PT POC, exam findings, bring aircast next time, plans to address need for AFO, walk more at home with assist  Person educated: Patient and cousin Education method: Explanation and Demonstration Education comprehension: verbalized understanding and needs further education  HOME EXERCISE PROGRAM: -walking at home with assist and aircast  GOALS: Goals reviewed with patient? Yes  SHORT TERM GOALS: Target date: 06/13/23  Pt will be independent with initial HEP for improved  functional strength and gait  Baseline: to be provided Goal status: INITIAL  2.  Pt will improve 5x STS to </= 16 sec to demo improved functional LE strength and balance   Baseline: 20.69s + CGA + HW Goal status: INITIAL  3.  Patient will complete sit <> stand with LRAD and no more than CGA consistently  Baseline: MinA Goal status: INITIAL  4.  Gait goal Baseline: to be assessed Goal status: INITIAL  5.  Patient will propel herself in her wc >/=116ft ModI for improved independence within her home Baseline: ~37ft with CGA Goal status: INITIAL   LONG TERM GOALS: Target date: 07/04/23  Pt will be independent with final HEP for improved functional strength and gait  Baseline: to be provided Goal status: INITIAL  Pt will improve 5x STS to </= 13 sec to demo improved functional LE strength and balance  Baseline: 20.69s + CGA + HW Goal status: INITIAL  Patient will complete sit <> stand with LRAD and no more than supervision consistently  Baseline: MinA Goal status: INITIAL  Gait goal Baseline: to be assessed Goal status: INITIAL   ASSESSMENT:  CLINICAL IMPRESSION: Patient is a 23 y.o. female who was seen today for physical therapy evaluation and treatment for mobility impairments s/p large L MCA CVA. She requires grossly MinA for all functional mobility using a HW and R ankle aircast to prevent inversion. She likely will need custom AFO in order to assist with R knee genu recurvatum + ankle instability + dorsiflexion. Five times Sit  to Stand Test (FTSS) Method: Use a straight back chair with a solid seat that is 17-18" high. Ask participant to sit on the chair with arms folded across their chest.   Instructions: "Stand up and sit down as quickly as possible 5 times, keeping your arms folded across your chest."   Measurement: Stop timing when the participant touches the chair in sitting the 5th time.  TIME: 20.69s + CGA + HW   Cut off scores indicative of increased fall  risk: >12 sec CVA, >16 sec PD, >13 sec vestibular (ANPTA Core Set of Outcome Measures for Adults with Neurologic Conditions, 2018). She would benefit from skilled PT services to address the above mentioned deficits.    OBJECTIVE IMPAIRMENTS: Abnormal gait, decreased activity tolerance, decreased balance, decreased cognition, decreased coordination, decreased knowledge of condition, decreased knowledge of use of DME, difficulty walking, decreased strength, decreased safety awareness, impaired tone, impaired UE functional use, and improper body mechanics.   ACTIVITY LIMITATIONS: carrying, lifting, standing, squatting, stairs, transfers, bed mobility, bathing, hygiene/grooming, locomotion level, and caring for others  PARTICIPATION LIMITATIONS: meal prep, cleaning, laundry, interpersonal relationship, driving, shopping, community activity, and occupation  PERSONAL FACTORS: Age, Past/current experiences, Social background, Time since onset of injury/illness/exacerbation, and Transportation are also affecting patient's functional outcome.   REHAB POTENTIAL: Fair time since onset  CLINICAL DECISION MAKING: Stable/uncomplicated  EVALUATION COMPLEXITY: Low  PLAN:  PT FREQUENCY: 2x/week  PT DURATION: 6 weeks  PLANNED INTERVENTIONS: 97164- PT Re-evaluation, 97110-Therapeutic exercises, 97530- Therapeutic activity, 97112- Neuromuscular re-education, 97535- Self Care, 60737- Manual therapy, [phone]- Gait training, [phone]- Orthotic Fit/training, [phone]- Aquatic Therapy, [phone]- Electrical stimulation (manual), Patient/Family education, Balance training, Stair training, Taping, Dry Needling, Vestibular training, Visual/preceptual remediation/compensation, DME instructions, Wheelchair mobility training, Cryotherapy, and Moist heat  PLAN FOR NEXT SESSION: TUG vs gait speed vs distance gait goal so long as she has her air cast, transfers, wc mobility, contact Jacqueline [name] re: AFO for RLE?   Westley Foots,  PT Westley Foots, PT, DPT, CBIS  05/23/2023, 8:15 AM

## 2023-05-23 MED ORDER — CLONAZEPAM 0.5 MG PO TABS: 0.5000 mg | ORAL_TABLET | Freq: Every day | ORAL | 0 refills | Status: DC

## 2023-05-27 ENCOUNTER — Other Ambulatory Visit: Payer: Self-pay

## 2023-05-27 DIAGNOSIS — I2699 Other pulmonary embolism without acute cor pulmonale: Secondary | ICD-10-CM

## 2023-05-27 DIAGNOSIS — D500 Iron deficiency anemia secondary to blood loss (chronic): Secondary | ICD-10-CM

## 2023-05-27 NOTE — Progress Notes (Signed)
 HEMATOLOGY/ONCOLOGY CLINIC NOTE  Date of Service: 05/28/2023  Patient Care Team: [name], MD as PCP - General (Pediatrics)  CHIEF COMPLAINTS/PURPOSE OF CONSULTATION:  Evaluation and management of Acute pulmonary embolism  HISTORY OF PRESENTING ILLNESS:  Jacqueline [name] is a wonderful 23 y.o. female who has been referred to [place] by [name], MD for evaluation and management of Acute pulmonary embolism.  Patient presented to the ED on 04/19/2022 for right-sided headache.  Today, she reports that since being hospitalized in February, she does experience pain in her right lower lungs. Patient denies any medical issues prior to her hospitalization. Her symptoms progressively worsened over the course of 3 day prior to her hospitalization. Patient had no leg pain or swelling at the time and no obvious clots were  found on [place]. She did lose about 30 pounds over the course of one month prior to her February hospitalization. Patient's weight has since stabilized. Patient was not on any other supplements or medications prior to being hospitalized.  Patient was deficient iron, folic acid, and B12 in February. She reports that she generally consumed meals once daily due to working frequently. Patient does regularly drink water.  She complains of severe headaches which are persistent and occur during the night. Her headaches have occurred for about a month. She notes that the tension/pain only occurs in her right temple. Patient does experience flashing light symptoms with her headaches. CT venogram 04/19/2022 did not show any abnormal findings.  Patient continues to have a lingering cough since  being infected with pneumonia. She denies any recent viral or COVID-19 infections. Her breathing has slightly improved. Patient continues to have a dry and productive cough with phlegm. She did smoke two cigarettes daily of tobacco-based Black and Mild.  She does generally take Eliquis on a  regular basis, but has not for the past 3 days due to running out. Patient has since had this refilled.   She did previously have a Nexplanon implantation, but had it removed in December 2023. She complains of heavy menstrual bleeding with plenty of blood clots. Her menstrual cycles typically last 5 days. Her menstrual cycle was regular after her Nexplanon implant was removed. Patient does not wish to be on any birth control at this time. She did endorse menstrual cycles while being off of blood thinners.   She reports that her grandmother had breast cancer and passed away at 70. She did not have any genetic mutations. She denies any fhx of autoimmune conditions but does note a fhx of diabetes mellitus. She denies any hx of blood clot in past. She denies any family history of blood clots.  She denies any new lumps/bumps, back pain, abdominal pain, pain over the sinuses, or leg swelling. She denies any joint pain, new rashes, nausea, or dizziness. She denies any long distance travel. Patient has a 27 and 28 year-old child.   INTERVAL HISTORY:  Jacqueline [name] is a 24 y.o. female here for continued evaluation and management of iron deficiency anemia, Acute pulmonary embolism, and thrombocytopenia.   She was initially seen by me on 07/26/2022 and reported right lower lung pain, 30-pound weight-loss in one month which had stabilized, limited po intake due to busy work schedule, severe persistent nightly headaches in the right temple, lingering dry productive cough since pneumonia infection, and heavy menstrual bleeding with plenty of clotting.  Today, she presents in a wheelchair and is accompanied by her mother, who helps provide history.   Patient  was previously seen by me after being admitted to the hospital earlier this month. She was consulted for abnormal blood counts, including being anemic mainly from iron deficiency from very heavy periods while on blood thinners.   Patient had low platelets in  the hospital, which are also part of antiphospholipid antibody syndrome.  She was treated with IVIG while in the hospital to block abnormal antibodies and she was put on steroids. Antibodies and steroids have help imporved her platelets. While in the hospital, her platelets were in the teens, then improved to 50K, and currently 80K. Her hgb was around 6-7 in the hospital and improved to 11 from IV iron.  She is currently on a half dose of  Eliquis at this time.   She continues to take Iron polysaccharide.   Patient is on 60 mg prednisone, 3 tablets once daily, at this time.   She does engage in outpatient physical therapy.   Patient has two children, aged 5 and 6.   She denies any abdominal pain or leg swelling.   MEDICAL HISTORY:  Past Medical History:  Diagnosis Date   Bacterial vaginitis 07/10/2016   Candida vaginitis 07/24/2016   Headache in pregnancy, antepartum, third trimester 07/24/2016   Kell isoimmunization during pregnancy     SURGICAL HISTORY: Past Surgical History:  Procedure Laterality Date   CESAREAN SECTION N/A 09/04/2016   Procedure: CESAREAN SECTION;  Surgeon: Levie Heritage, DO;  Location: [facility];  Service: Obstetrics;  Laterality: N/A;   CESAREAN SECTION Bilateral    IR CT HEAD LTD  02/21/2023   IR GASTROSTOMY TUBE MOD SED  03/21/2023   IR GASTROSTOMY TUBE REMOVAL  05/14/2023   IR PERCUTANEOUS ART THROMBECTOMY/INFUSION INTRACRANIAL INC DIAG ANGIO  02/21/2023   IR REPLC GASTRO/COLONIC TUBE PERCUT W/FLUORO  03/27/2023   RADIOLOGY WITH ANESTHESIA N/A 02/21/2023   Procedure: IR WITH ANESTHESIA;  Surgeon: Julieanne Cotton, MD;  Location: [facility];  Service: Radiology;  Laterality: N/A;    SOCIAL HISTORY: Social History   Socioeconomic History   Marital status: Single    Spouse name: Not on file   Number of children: Not on file   Years of education: Not on file   Highest education level: Not on file  Occupational History   Not on file  Tobacco Use    Smoking status: Former    Types: Cigars   Smokeless tobacco: Never   Tobacco comments:    1 B&M per day  Vaping Use   Vaping status: Never Used  Substance and Sexual Activity   Alcohol use: Not Currently   Drug use: No   Sexual activity: Yes    Partners: Male    Birth control/protection: None  Other Topics Concern   Not on file  Social History Narrative   [phone]- patient's confidential cell   Social Drivers of Health   Financial Resource Strain: Not on File (06/21/2021)   Received from [employer], [employer]    Financial Resource Strain: 0  Food Insecurity: No Food Insecurity (05/07/2023)   Hunger Vital Sign    Worried About Running Out of Food in the Last Year: Never true    Ran Out of Food in the Last Year: Never true  Transportation Needs: No Transportation Needs (05/07/2023)   PRAPARE - Administrator, Civil Service (Medical): No    Lack of Transportation (Non-Medical): No  Physical Activity: Not on File (06/21/2021)   Received from [place], [place]  Physical Activity    Physical Activity: 0  Stress: Not on File (06/21/2021)   Received from [facility], [place]   Stress    Stress: 0  Social Connections: Not on File (11/16/2022)   Received from [facility]   Social Connections    Connectedness: 0  Intimate Partner Violence: Patient Declined (05/07/2023)   Humiliation, Afraid, Rape, and Kick questionnaire    Fear of Current or Ex-Partner: Patient declined    Emotionally Abused: Patient declined    Physically Abused: Patient declined    Sexually Abused: Patient declined    FAMILY HISTORY: Family History  Problem Relation Age of Onset   Hypertension Maternal Grandmother    Diabetes Maternal Grandmother    Cancer Neg Hx     ALLERGIES:  has no known allergies.  MEDICATIONS:  Current Outpatient Medications  Medication Sig Dispense Refill   acetaminophen (TYLENOL) 325 MG tablet Take 2 tablets (650 mg total) by mouth every 4 (four) hours as  needed for mild pain (pain score 1-3) (or temp > 37.5 C (99.5 F)).     apixaban (ELIQUIS) 2.5 MG TABS tablet Take 1 tablet (2.5 mg total) by mouth 2 (two) times daily. 60 tablet 1   clonazePAM (KLONOPIN) 0.5 MG tablet Take 1 tablet (0.5 mg total) by mouth at bedtime. 30 tablet 0   cyanocobalamin 1000 MCG tablet Take 2 tablets (2,000 mcg total) by mouth daily. 60 tablet 0   docusate sodium (COLACE) 100 MG capsule Take 1 capsule (100 mg total) by mouth 3 (three) times daily. (Patient not taking: Reported on 05/22/2023)     FLUoxetine (PROZAC) 10 MG capsule Take 1 capsule by mouth daily.     folic acid (FOLVITE) 1 MG tablet Take 1 tablet (1 mg total) by mouth daily. 30 tablet 0   gabapentin (NEURONTIN) 100 MG capsule Take 1 capsule (100 mg total) by mouth 3 (three) times daily. 90 capsule 1   iron polysaccharides (NIFEREX) 150 MG capsule Take 1 capsule (150 mg total) by mouth daily. 30 capsule 0   Multiple Vitamin (MULTIVITAMIN WITH MINERALS) TABS tablet Take 1 tablet by mouth daily.     naloxone (NARCAN) nasal spray 4 mg/0.1 mL Place 1 spray into the nose as needed (opioid overdose).     oxyCODONE (OXY IR/ROXICODONE) 5 MG immediate release tablet Take 1 tablet (5 mg total) by mouth every 4 (four) hours as needed (perceived pain). 30 tablet 0   polyethylene glycol (MIRALAX / GLYCOLAX) 17 g packet Take 17 g by mouth 2 (two) times daily. (Patient not taking: Reported on 05/22/2023)     predniSONE (DELTASONE) 20 MG tablet Take 3 tablets (60 mg total) by mouth daily with breakfast for 14 days. 42 tablet 0   No current facility-administered medications for this visit.    REVIEW OF SYSTEMS:    10 Point review of Systems was done is negative except as noted above.   PHYSICAL EXAMINATION: ECOG PERFORMANCE STATUS: 1 - Symptomatic but completely ambulatory  . Vitals:   05/28/23 1510  BP: 111/66  Pulse: 71  Resp: 18  Temp: 98.1 F (36.7 C)  SpO2: 97%    Filed Weights   05/28/23 1510  Weight:  168 lb 14.4 oz (76.6 kg)    .Body mass index is 28.99 kg/m.   GENERAL:alert, in no acute distress and comfortable SKIN: no acute rashes, no significant lesions EYES: conjunctiva are pink and non-injected, sclera anicteric OROPHARYNX: MMM, no exudates, no oropharyngeal erythema or ulceration NECK: supple, no JVD  LYMPH:  no palpable lymphadenopathy in the cervical, axillary or inguinal regions LUNGS: clear to auscultation b/l with normal respiratory effort HEART: regular rate & rhythm ABDOMEN:  normoactive bowel sounds , non tender, not distended. Extremity: no pedal edema PSYCH: alert & oriented x 3 with fluent speech NEURO: no focal motor/sensory deficits   LABORATORY DATA:  I have reviewed the data as listed .    Latest Ref Rng & Units 05/28/2023    3:05 PM 05/15/2023    3:20 AM 05/14/2023    1:19 PM  CBC  WBC 4.0 - 10.5 K/uL 10.5  6.2  6.6   Hemoglobin 12.0 - 15.0 g/dL 95.6  8.7  8.6   Hematocrit 36.0 - 46.0 % 31.7  26.9  26.1   Platelets 150 - 400 K/uL 79  55  50    .    Latest Ref Rng & Units 05/28/2023    3:05 PM 05/13/2023   10:46 AM 05/12/2023    3:07 AM  CMP  Glucose 70 - 99 mg/dL 85  213  92   BUN 6 - 20 mg/dL 11  20  18    Creatinine 0.44 - 1.00 mg/dL 0.86  5.78  4.69   Sodium 135 - 145 mmol/L 137  134  136   Potassium 3.5 - 5.1 mmol/L 4.1  3.8  3.7   Chloride 98 - 111 mmol/L 108  106  107   CO2 22 - 32 mmol/L 25  19  21    Calcium 8.9 - 10.3 mg/dL 9.1  9.2  8.7   Total Protein 6.5 - 8.1 g/dL 7.7  8.3    Total Bilirubin 0.0 - 1.2 mg/dL 0.3  0.5    Alkaline Phos 38 - 126 U/L 50  57    AST 15 - 41 U/L 23  16    ALT 0 - 44 U/L 15  9     . Lab Results  Component Value Date   IRON 71 05/28/2023   TIBC 262 05/28/2023   IRONPCTSAT 27 05/28/2023   (Iron and TIBC)  Lab Results  Component Value Date   FERRITIN 196 05/28/2023     RADIOGRAPHIC STUDIES: I have personally reviewed the radiological images as listed and agreed with the findings in the  report. IR GASTROSTOMY TUBE REMOVAL/REPAIR Result Date: 05/14/2023 INDICATION: Patient with history of gastrostomy tube placement on 03/21/2023; patient no longer using tube and request now received for gastrostomy tube removal EXAM: GASTROSTOMY TUBE REMOVAL MEDICATIONS: None ANESTHESIA/SEDATION: None CONTRAST:  None FLUOROSCOPY: None COMPLICATIONS: None immediate. PROCEDURE: Informed consent was obtained from the patient after a thorough discussion of the procedural risks, benefits and alternatives. All questions were addressed. A timeout was performed prior to the initiation of the procedure. The retention balloon was decompressed, then the gastrostomy tube balloon and using manual traction the tube was removed in its entirety without immediate complications. Gauze dressing applied over site. IMPRESSION: Successful removal of a 20 Fr balloon-retention gastrostomy tube. Performed GE:[id number] Allred,PA-C Electronically Signed   By: Roanna Banning M.D.   On: 05/14/2023 11:43   DG Abd 1 View Result Date: 05/13/2023 CLINICAL DATA:  Abdominal pain EXAM: ABDOMEN - 1 VIEW COMPARISON:  04/16/2023 FINDINGS: The bowel gas pattern is normal. Moderate colonic stool burden. Gastrostomy catheter overlies its expected position within the epigastrium. No radio-opaque calculi or other significant radiographic abnormality are seen. IMPRESSION: 1. Moderate colonic stool burden. Electronically Signed   By: Helyn Numbers M.D.   On: 05/13/2023 23:19  ASSESSMENT & PLAN:   23 y.o. female with:  Acute on chronic thrombocytopenia - likely multifactorial.  Some component of drop due to consumption from significant menstrual bleeding. 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 No overt associated autoimmune condition at this time associated with the antiphospholipid antibody syndrome at this  time.  -  Platelets continuing to improve 79k today.  - Status post IVIG x2 doses, completed 05/13/23 and on Prednisone taper. -S/p one unit platelets on 3/9.   2. Anemia IDA History of folate deficiency History of Menorrhagia - IDA due to menorrhagia which she has had prior to anticoagulation and now worse with Anticoagulation and thrombocytopenia. Currently periods are over.  - Status post prbc transfusions this  - Follow up with Ob/Gyn to help control periods. Was previously on Megace which might increase risk of VTE. Also would not recommend systemic hormonal birth control pills due to high risk of VTE in the context of previous h/o PE and antiphospholipid antibody syndrome.    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 - Lupus anticoagulant persistently strongly positive and confirmed with Hexagonal phospholipid neutralization assay.  - remains at high risk of venous and arterial thrombosis as well as bleeding if she were put on therapeutic anticoagulation at this time  - Status post Eliquis which was stopped in June 2024.  However was restarted due to recent stroke.  Last dose 05/05/2023.   - Restarted Eliquis 2.5 mg po bid as platelets now over 50k. HOLD if platelets noted to be below 50k. - Recommend increase to full dose of 5mg  po BID if platelets continue to improve and remain stable.  - Initiated IVIG 1g/kg q24h x 2 doses with tylenol, loratadine and solumedrol as premedications and is on prednisone taper.   4. 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     - Noted difficulty with speech and word finding - Continue supportive care   PLAN:  -Discussed lab results on 05/28/2023 in detail with patient. CBC showed WBC of 10.5K, hemoglobin of 11.1, and platelets of 79K. -platelets improved from 50K two weeks ago to 79K  currently -hgb improved from 8.6 two weeks ago to 11.1 currently with IV iron  -ferritin has improved to 196 with iron saturation of 27% -iron saturation 27%, which suggests that she hopefully will not need IV iron at this time -continue oral iron polysaccaride  -Discussed second concern of thrombocytopenia and third concern of antiphospholipid antibodies. Discussed that her abnormal antibodies are acquired and not inherited. Her antiphospholipid antibodies do make her very prone to having blood clots requiring continuous blood thinners. Discussed the need to balance bleeding vs clotting concerns overall.  -discussed that her low platelets are also part of antiphospholipid antibody syndrome.  -discussed that she cannot be on blood thinners if platelets drop <50K -discussed that since she cannot be on steroids for the long term, a decision would need to be made in regards to how to normalize her platelets so that she can continue blood thinners -She is currently on a half dose of  Eliquis at this time. -discussed concern of plenty of bleeding with her periods while on blood thinners.  -advised patient and her mother to connect with patient's OB/GYN to manage menstrual bleeding.  -discussed potential last option of receiving Lupron shot  every 3 months to shut down her periods temporarily if there is no other way to stop her menstrual bleeding. Discussed that Lupron shots may cause temporary menopausal symptoms such as hot flashes. -patient is agreeable to setting up Lupron shots -will set up lupron -discussed that she cannot be on birth control due to high risk of VTE in the context of previous h/o PE and antiphospholipid antibody syndrome.  -will gradually taper down Prednisone - dose reduced to 40mg  po daily with breakfast -discussed potential side effects of fluid retention and muscle wekaness with long-term steroids, which is why there is a role to wean off of Prednisone. Discussed concern of  platelets potentially dropping as we wean down steroid dose, which would need to be monitored.  -discussed that as we cut down steroids, Rituxan  immunotherapy IV infusion treatment would be recommended weekly x4 doses to reduce the abnormal antibodies that are prone to increaseing the risk of blood clots and stroke and also causing platleets to be low -will set up rituxan infusion -discussed that if her platelets are stable, there would be a role to increase blood thinners to full dose for increased protection against blood clots with Eliquis dose of two 5 MG tablets twice daily  -answered all of patient's and her mother's questions in detail  FOLLOW-UP: Chemo-counseling for Rituxan and Lupron PLz schedule to start Rituxan infusion in 7-10 days Labs with each treatment MD visit with C1 and C2 of weekly Rituxan  The total time spent in the appointment was 41 minutes* .  All of the patient's questions were answered with apparent satisfaction. The patient knows to call the clinic with any problems, questions or concerns.   Wyvonnia Lora MD MS AAHIVMS [facility] Hematology/Oncology Physician [facility]  .*Total Encounter Time as defined by the Centers for Medicare and Medicaid Services includes, in addition to the face-to-face time of a patient visit (documented in the note above) non-face-to-face time: obtaining and reviewing outside history, ordering and reviewing medications, tests or procedures, care coordination (communications with other health care professionals or caregivers) and documentation in the medical record.    I,Mitra Faeizi,acting as a Neurosurgeon for Wyvonnia Lora, MD.,have documented all relevant documentation on the behalf of Wyvonnia Lora, MD,as directed by  Wyvonnia Lora, MD while in the presence of Wyvonnia Lora, MD.  .I have reviewed the above documentation for accuracy and completeness, and I agree with the above. Johney Maine MD

## 2023-05-28 ENCOUNTER — Ambulatory Visit: Admitting: Physical Therapy

## 2023-05-28 ENCOUNTER — Ambulatory Visit

## 2023-05-28 ENCOUNTER — Inpatient Hospital Stay: Attending: Hematology

## 2023-05-28 ENCOUNTER — Inpatient Hospital Stay (HOSPITAL_BASED_OUTPATIENT_CLINIC_OR_DEPARTMENT_OTHER): Admitting: Hematology

## 2023-05-28 VITALS — BP 111/66 | HR 71 | Temp 98.1°F | Resp 18 | Ht 64.0 in | Wt 168.9 lb

## 2023-05-28 DIAGNOSIS — N920 Excessive and frequent menstruation with regular cycle: Secondary | ICD-10-CM | POA: Insufficient documentation

## 2023-05-28 DIAGNOSIS — I69320 Aphasia following cerebral infarction: Secondary | ICD-10-CM | POA: Insufficient documentation

## 2023-05-28 DIAGNOSIS — R059 Cough, unspecified: Secondary | ICD-10-CM | POA: Insufficient documentation

## 2023-05-28 DIAGNOSIS — Z86711 Personal history of pulmonary embolism: Secondary | ICD-10-CM | POA: Diagnosis not present

## 2023-05-28 DIAGNOSIS — Z833 Family history of diabetes mellitus: Secondary | ICD-10-CM | POA: Diagnosis not present

## 2023-05-28 DIAGNOSIS — R109 Unspecified abdominal pain: Secondary | ICD-10-CM | POA: Insufficient documentation

## 2023-05-28 DIAGNOSIS — I2699 Other pulmonary embolism without acute cor pulmonale: Secondary | ICD-10-CM | POA: Diagnosis present

## 2023-05-28 DIAGNOSIS — Z79899 Other long term (current) drug therapy: Secondary | ICD-10-CM | POA: Diagnosis not present

## 2023-05-28 DIAGNOSIS — Z931 Gastrostomy status: Secondary | ICD-10-CM | POA: Diagnosis not present

## 2023-05-28 DIAGNOSIS — Z803 Family history of malignant neoplasm of breast: Secondary | ICD-10-CM | POA: Insufficient documentation

## 2023-05-28 DIAGNOSIS — D696 Thrombocytopenia, unspecified: Secondary | ICD-10-CM | POA: Insufficient documentation

## 2023-05-28 DIAGNOSIS — D693 Immune thrombocytopenic purpura: Secondary | ICD-10-CM

## 2023-05-28 DIAGNOSIS — D509 Iron deficiency anemia, unspecified: Secondary | ICD-10-CM | POA: Insufficient documentation

## 2023-05-28 DIAGNOSIS — Z8249 Family history of ischemic heart disease and other diseases of the circulatory system: Secondary | ICD-10-CM | POA: Insufficient documentation

## 2023-05-28 DIAGNOSIS — D689 Coagulation defect, unspecified: Secondary | ICD-10-CM

## 2023-05-28 DIAGNOSIS — D6861 Antiphospholipid syndrome: Secondary | ICD-10-CM

## 2023-05-28 DIAGNOSIS — I69351 Hemiplegia and hemiparesis following cerebral infarction affecting right dominant side: Secondary | ICD-10-CM | POA: Diagnosis not present

## 2023-05-28 DIAGNOSIS — D500 Iron deficiency anemia secondary to blood loss (chronic): Secondary | ICD-10-CM

## 2023-05-28 DIAGNOSIS — R519 Headache, unspecified: Secondary | ICD-10-CM | POA: Insufficient documentation

## 2023-05-28 DIAGNOSIS — Z87891 Personal history of nicotine dependence: Secondary | ICD-10-CM | POA: Diagnosis not present

## 2023-05-28 LAB — CMP (CANCER CENTER ONLY)
ALT: 15 U/L (ref 0–44)
AST: 23 U/L (ref 15–41)
Albumin: 3.8 g/dL (ref 3.5–5.0)
Alkaline Phosphatase: 50 U/L (ref 38–126)
Anion gap: 4 — ABNORMAL LOW (ref 5–15)
BUN: 11 mg/dL (ref 6–20)
CO2: 25 mmol/L (ref 22–32)
Calcium: 9.1 mg/dL (ref 8.9–10.3)
Chloride: 108 mmol/L (ref 98–111)
Creatinine: 0.65 mg/dL (ref 0.44–1.00)
GFR, Estimated: >60 mL/min (ref >60)
Glucose, Bld: 85 mg/dL (ref 70–99)
Potassium: 4.1 mmol/L (ref 3.5–5.1)
Sodium: 137 mmol/L (ref 135–145)
Total Bilirubin: 0.3 mg/dL (ref 0.0–1.2)
Total Protein: 7.7 g/dL (ref 6.5–8.1)

## 2023-05-28 LAB — IRON AND IRON BINDING CAPACITY (CC-WL,HP ONLY)
Iron: 71 ug/dL (ref 28–170)
Saturation Ratios: 27 % (ref 10.4–31.8)
TIBC: 262 ug/dL (ref 250–450)
UIBC: 191 ug/dL (ref 148–442)

## 2023-05-28 LAB — CBC WITH DIFFERENTIAL (CANCER CENTER ONLY)
Abs Immature Granulocytes: 0.03 10*3/uL (ref 0.00–0.07)
Basophils Absolute: 0 10*3/uL (ref 0.0–0.1)
Basophils Relative: 0 %
Eosinophils Absolute: 0 10*3/uL (ref 0.0–0.5)
Eosinophils Relative: 0 %
HCT: 31.7 % — ABNORMAL LOW (ref 36.0–46.0)
Hemoglobin: 11.1 g/dL — ABNORMAL LOW (ref 12.0–15.0)
Immature Granulocytes: 0 %
Lymphocytes Relative: 16 %
Lymphs Abs: 1.6 10*3/uL (ref 0.7–4.0)
MCH: 28 pg (ref 26.0–34.0)
MCHC: 35 g/dL (ref 30.0–36.0)
MCV: 80.1 fL (ref 80.0–100.0)
Monocytes Absolute: 0.7 10*3/uL (ref 0.1–1.0)
Monocytes Relative: 6 %
Neutro Abs: 8.1 10*3/uL — ABNORMAL HIGH (ref 1.7–7.7)
Neutrophils Relative %: 78 %
Platelet Count: 79 10*3/uL — ABNORMAL LOW (ref 150–400)
RBC: 3.96 MIL/uL (ref 3.87–5.11)
RDW: 16 % — ABNORMAL HIGH (ref 11.5–15.5)
WBC Count: 10.5 10*3/uL (ref 4.0–10.5)
nRBC: 0 % (ref 0.0–0.2)

## 2023-05-28 LAB — IMMATURE PLATELET FRACTION: Immature Platelet Fraction: 19.3 % — ABNORMAL HIGH (ref 1.2–8.6)

## 2023-05-28 MED ORDER — PREDNISONE 20 MG PO TABS: ORAL_TABLET | ORAL | 0 refills | Status: AC

## 2023-05-29 LAB — FERRITIN: Ferritin: 196 ng/mL (ref 11–307)

## 2023-06-03 ENCOUNTER — Ambulatory Visit: Admitting: Occupational Therapy

## 2023-06-03 ENCOUNTER — Ambulatory Visit: Attending: Pediatrics

## 2023-06-03 ENCOUNTER — Encounter: Payer: Self-pay | Admitting: Occupational Therapy

## 2023-06-03 VITALS — BP 130/50

## 2023-06-03 DIAGNOSIS — R2681 Unsteadiness on feet: Secondary | ICD-10-CM | POA: Diagnosis present

## 2023-06-03 DIAGNOSIS — M6281 Muscle weakness (generalized): Secondary | ICD-10-CM | POA: Insufficient documentation

## 2023-06-03 DIAGNOSIS — R262 Difficulty in walking, not elsewhere classified: Secondary | ICD-10-CM | POA: Diagnosis present

## 2023-06-03 DIAGNOSIS — Z741 Need for assistance with personal care: Secondary | ICD-10-CM | POA: Insufficient documentation

## 2023-06-03 DIAGNOSIS — R293 Abnormal posture: Secondary | ICD-10-CM | POA: Diagnosis present

## 2023-06-03 DIAGNOSIS — R2689 Other abnormalities of gait and mobility: Secondary | ICD-10-CM | POA: Diagnosis present

## 2023-06-03 DIAGNOSIS — R29818 Other symptoms and signs involving the nervous system: Secondary | ICD-10-CM | POA: Insufficient documentation

## 2023-06-03 DIAGNOSIS — R41844 Frontal lobe and executive function deficit: Secondary | ICD-10-CM | POA: Diagnosis present

## 2023-06-03 DIAGNOSIS — R278 Other lack of coordination: Secondary | ICD-10-CM | POA: Diagnosis present

## 2023-06-03 DIAGNOSIS — R208 Other disturbances of skin sensation: Secondary | ICD-10-CM | POA: Insufficient documentation

## 2023-06-03 NOTE — Therapy (Unsigned)
 OUTPATIENT PHYSICAL THERAPY NEURO TREATMENT   Patient Name: Jacqueline [name] MRN: [id number] DOB:2000/07/11, 23 y.o., female 8 Date: 06/04/2023   PCP: [name], MD REFERRING PROVIDER: [name], MD  END OF SESSION:  PT End of Session - 06/03/23 1449     Visit Number 2    Number of Visits 13    Date for PT Re-Evaluation 07/04/23    Authorization Type [place] medicaid prepaid    PT Start Time 1448   received from OT   PT Stop Time 1528    PT Time Calculation (min) 40 min    Equipment Utilized During Treatment Gait belt    Activity Tolerance Patient tolerated treatment well    Behavior During Therapy [facility] for tasks assessed/performed             Past Medical History:  Diagnosis Date   Bacterial vaginitis 07/10/2016   Candida vaginitis 07/24/2016   Headache in pregnancy, antepartum, third trimester 07/24/2016   Kell isoimmunization during pregnancy    Past Surgical History:  Procedure Laterality Date   CESAREAN SECTION N/A 09/04/2016   Procedure: CESAREAN SECTION;  Surgeon: Levie Heritage, DO;  Location: [facility];  Service: Obstetrics;  Laterality: N/A;   CESAREAN SECTION Bilateral    IR CT HEAD LTD  02/21/2023   IR GASTROSTOMY TUBE MOD SED  03/21/2023   IR GASTROSTOMY TUBE REMOVAL  05/14/2023   IR PERCUTANEOUS ART THROMBECTOMY/INFUSION INTRACRANIAL INC DIAG ANGIO  02/21/2023   IR REPLC GASTRO/COLONIC TUBE PERCUT W/FLUORO  03/27/2023   RADIOLOGY WITH ANESTHESIA N/A 02/21/2023   Procedure: IR WITH ANESTHESIA;  Surgeon: Julieanne Cotton, MD;  Location: [facility] OR;  Service: Radiology;  Laterality: N/A;   Patient Active Problem List   Diagnosis Date Noted   Antiphospholipid antibody syndrome (HCC) 05/12/2023   Symptomatic anemia 05/07/2023   History of stroke 05/06/2023   Bacteremia 03/27/2023   SIRS (systemic inflammatory response syndrome) (HCC) 03/26/2023   Hyponatremia 03/26/2023   Ileus (HCC) 03/26/2023   Pain around PEG tube site, initial  encounter 03/26/2023   Transaminitis 03/26/2023   History of pulmonary embolism 03/26/2023   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   Agitation 03/04/2023   Urinary tract infection without hematuria 03/04/2023   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/04/2023   Dysphagia 03/04/2023   Vitamin B12 deficiency 03/04/2023   Acute ischemic left MCA stroke (HCC) 02/21/2023   Middle cerebral artery embolism, left 02/21/2023   Community acquired pneumonia of right lower lobe of lung 04/10/2022   Iron deficiency anemia 04/09/2022   Folic acid deficiency 04/09/2022   Acute pulmonary embolism (HCC) 04/08/2022   Normocytic anemia 04/08/2022   Thrombocytopenia (HCC) 04/08/2022   Hypokalemia 04/08/2022   Moderate protein malnutrition (HCC) 04/08/2022   Nexplanon insertion 08/05/2017   PPH (postpartum hemorrhage) 08/04/2017   VBAC (vaginal birth after Cesarean) 08/04/2017   Abnormal fetal position 07/09/2017   Limited prenatal care in third trimester 07/09/2017   Anemia in pregnancy 06/02/2017   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   Thrombocytopenia affecting pregnancy, antepartum (HCC) 03/06/2017   Supervision of other normal pregnancy, antepartum 02/28/2017   Biological false positive RPR test 02/28/2017   H/O cesarean section 02/28/2017   Mild tetrahydrocannabinol (THC) abuse 04/25/2016   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   Maternal varicella, non-immune 02/08/2016    ONSET DATE: 05/15/23 referral  REFERRING DIAG: D69.6 (ICD-10-CM) - Thrombocytopenia (HCC)   THERAPY DIAG:  Abnormal posture  Difficulty in walking, not elsewhere classified  Other  lack of coordination  Muscle weakness (generalized)  Unsteadiness on feet  Rationale for Evaluation and Treatment: Rehabilitation  SUBJECTIVE:                                                                                                                                                                                              SUBJECTIVE STATEMENT: Patient arrives to clinic with mom. Brought in swedish knee cage, but has not been wearing air cast. Walking independently with knee cage + hemi walker, but not using air cast and mom reports frequent ankle inversion. Denies falls.  Pt accompanied by: family member  PERTINENT HISTORY: kell isoimmunization during pregnancy, L MCA CVA  PAIN:  Are you having pain? No  PRECAUTIONS: Fall and Other: R hemiparesis, aphasic   PATIENT GOALS: "to walk"   OBJECTIVE:  Note: Objective measures were completed at Evaluation unless otherwise noted.  DIAGNOSTIC FINDINGS: 04/18/23 brain MRI IMPRESSION: 1. Large area of encephalomalacia and gliosis in the left frontotemporal parietal and insular region consistent with known prior left MCA territory infarct. Within that area, multiple small focal areas of restricted diffusion are seen, suggestive of acute on chronic process. 2. Other areas of encephalomalacia and gliosis in the left frontal lobe as well as in the left caudate and right ACA/MCA watershed region.                                                                                                                              TREATMENT GAIT: -donned swedish knee cage + ACE wrap DF wrap to prevent inversion  -39ft x2 with HW + CGA  -2nd bout with tactile assist for R heel contact  -seated R hamstring iso -standing marches for increased lateral weight shirt R and improved R knee stance control -sit <> stand blocked practice with R LE biased   -ModA with heavy reliance on L UE   PATIENT EDUCATION: Education details: initial HEP, walking with assist and quality emphasis  Person educated: Patient and mom Education method: Explanation and Demonstration Education comprehension: verbalized understanding and needs further education  HOME EXERCISE PROGRAM: -walking at home with assist and aircast Access Code: [email or web address] URL:  [email or web address] Date: 06/03/2023 Prepared by: Merry Lofty  Exercises - Sit to Stand with Counter Support  - 1 x daily - 7 x weekly - 3 sets - 5 reps - Seated Hamstring Set  - 1 x daily - 7 x weekly - 3 sets - 10 reps - Supine March  - 1 x daily - 7 x weekly - 3 sets - 10 reps  GOALS: Goals reviewed with patient? Yes  SHORT TERM GOALS: Target date: 06/13/23  Pt will be independent with initial HEP for improved functional strength and gait  Baseline: to be provided Goal status: INITIAL  2.  Pt will improve 5x STS to </= 16 sec to demo improved functional LE strength and balance   Baseline: 20.69s + CGA + HW Goal status: INITIAL  3.  Patient will complete sit <> stand with LRAD and no more than CGA consistently  Baseline: MinA Goal status: INITIAL  4.  Patient will ambulate >/= 50 ft using LRAD + no more than CGA consistently  Baseline: 20ft with HW + CGA Goal status: REVISED  5.  Patient will propel herself in her wc >/=167ft ModI for improved independence within her home Baseline: ~29ft with CGA Goal status: INITIAL   LONG TERM GOALS: Target date: 07/04/23  Pt will be independent with final HEP for improved functional strength and gait  Baseline: to be provided Goal status: INITIAL  Pt will improve 5x STS to </= 13 sec to demo improved functional LE strength and balance  Baseline: 20.69s + CGA + HW Goal status: INITIAL  Patient will complete sit <> stand with LRAD and no more than supervision consistently  Baseline: MinA Goal status: INITIAL  Patient will ambulate >/= 172ft with LRAD and no more than supervision.  Baseline: 68ft HW + CGA Goal status: REVISED   ASSESSMENT:  CLINICAL IMPRESSION: Patient seen for skilled PT session with emphasis on gait assessment + initiating HEP. She is NOT safe to be ambulating without both a knee cage and an aircast/ other brace to prevent R ankle inversion. Discussed this with mom and patient and they  both verbalized understanding. With fatigue, she requires increasing assist to advance R LE past L LE and achieve even flat foot contact. PT questioning R inattention given such limited ability/desire to weight shift R and so interventions used to target this, but would likely benefit from visual feedback on such. Continue POC.    OBJECTIVE IMPAIRMENTS: Abnormal gait, decreased activity tolerance, decreased balance, decreased cognition, decreased coordination, decreased knowledge of condition, decreased knowledge of use of DME, difficulty walking, decreased strength, decreased safety awareness, impaired tone, impaired UE functional use, and improper body mechanics.   ACTIVITY LIMITATIONS: carrying, lifting, standing, squatting, stairs, transfers, bed mobility, bathing, hygiene/grooming, locomotion level, and caring for others  PARTICIPATION LIMITATIONS: meal prep, cleaning, laundry, interpersonal relationship, driving, shopping, community activity, and occupation  PERSONAL FACTORS: Age, Past/current experiences, Social background, Time since onset of injury/illness/exacerbation, and Transportation are also affecting patient's functional outcome.   REHAB POTENTIAL: Fair time since onset  CLINICAL DECISION MAKING: Stable/uncomplicated  EVALUATION COMPLEXITY: Low  PLAN:  PT FREQUENCY: 2x/week  PT DURATION: 6 weeks  PLANNED INTERVENTIONS: 97164- PT Re-evaluation, 97110-Therapeutic exercises, 97530- Therapeutic activity, 97112- Neuromuscular re-education, 97535- Self Care, 16109- Manual therapy, [phone]- Gait training, [phone]- Orthotic Fit/training, [phone]- Aquatic Therapy, [phone]- Electrical stimulation (manual), Patient/Family education, Balance training, Stair training, Taping, Dry Needling, Vestibular training,  Visual/preceptual remediation/compensation, DME instructions, Wheelchair mobility training, Cryotherapy, and Moist heat  PLAN FOR NEXT SESSION: transfers, wc mobility, contact olivia re:  AFO for RLE?   Westley Foots, PT Westley Foots, PT, DPT, CBIS  06/04/2023, 7:56 AM

## 2023-06-03 NOTE — Therapy (Unsigned)
 OUTPATIENT OCCUPATIONAL THERAPY NEURO EVALUATION  Patient Name: Jacqueline [name] MRN: [id number] DOB:2000/04/25, 23 y.o., female 19 Date: 06/03/2023  PCP: [name], MD REFERRING PROVIDER: Madaline Guthrie, MD   END OF SESSION:  OT End of Session - 06/03/23 1425     Visit Number 1    Number of Visits 10    Authorization Type [employer]    Authorization Time Period Auth Not Reqd VL; 27    OT Start Time 1424    OT Stop Time 1445    OT Time Calculation (min) 21 min    Equipment Utilized During Treatment testing material    Activity Tolerance Patient tolerated treatment well    Behavior During Therapy [facility] for tasks assessed/performed             Past Medical History:  Diagnosis Date   Bacterial vaginitis 07/10/2016   Candida vaginitis 07/24/2016   Headache in pregnancy, antepartum, third trimester 07/24/2016   Kell isoimmunization during pregnancy    Past Surgical History:  Procedure Laterality Date   CESAREAN SECTION N/A 09/04/2016   Procedure: CESAREAN SECTION;  Surgeon: Levie Heritage, DO;  Location: [facility];  Service: Obstetrics;  Laterality: N/A;   CESAREAN SECTION Bilateral    IR CT HEAD LTD  02/21/2023   IR GASTROSTOMY TUBE MOD SED  03/21/2023   IR GASTROSTOMY TUBE REMOVAL  05/14/2023   IR PERCUTANEOUS ART THROMBECTOMY/INFUSION INTRACRANIAL INC DIAG ANGIO  02/21/2023   IR REPLC GASTRO/COLONIC TUBE PERCUT W/FLUORO  03/27/2023   RADIOLOGY WITH ANESTHESIA N/A 02/21/2023   Procedure: IR WITH ANESTHESIA;  Surgeon: Julieanne Cotton, MD;  Location: [facility] OR;  Service: Radiology;  Laterality: N/A;   Patient Active Problem List   Diagnosis Date Noted   Antiphospholipid antibody syndrome (HCC) 05/12/2023   Symptomatic anemia 05/07/2023   History of stroke 05/06/2023   Bacteremia 03/27/2023   SIRS (systemic inflammatory response syndrome) (HCC) 03/26/2023   Hyponatremia 03/26/2023   Ileus (HCC) 03/26/2023   Pain around PEG tube site, initial  encounter 03/26/2023   Transaminitis 03/26/2023   History of pulmonary embolism 03/26/2023   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   Agitation 03/04/2023   Urinary tract infection without hematuria 03/04/2023   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/04/2023   Dysphagia 03/04/2023   Vitamin B12 deficiency 03/04/2023   Acute ischemic left MCA stroke (HCC) 02/21/2023   Middle cerebral artery embolism, left 02/21/2023   Community acquired pneumonia of right lower lobe of lung 04/10/2022   Iron deficiency anemia 04/09/2022   Folic acid deficiency 04/09/2022   Acute pulmonary embolism (HCC) 04/08/2022   Normocytic anemia 04/08/2022   Thrombocytopenia (HCC) 04/08/2022   Hypokalemia 04/08/2022   Moderate protein malnutrition (HCC) 04/08/2022   Nexplanon insertion 08/05/2017   PPH (postpartum hemorrhage) 08/04/2017   VBAC (vaginal birth after Cesarean) 08/04/2017   Abnormal fetal position 07/09/2017   Limited prenatal care in third trimester 07/09/2017   Anemia in pregnancy 06/02/2017   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   Thrombocytopenia affecting pregnancy, antepartum (HCC) 03/06/2017   Supervision of other normal pregnancy, antepartum 02/28/2017   Biological false positive RPR test 02/28/2017   H/O cesarean section 02/28/2017   Mild tetrahydrocannabinol (THC) abuse 04/25/2016   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   Maternal varicella, non-immune 02/08/2016    ONSET DATE: 05/21/2023   REFERRING DIAG: I69.30 (ICD-10-CM) - Unspecified sequelae of cerebral infarction   THERAPY DIAG:  Muscle weakness (generalized)  Other lack of coordination  Other  disturbances of skin sensation  Other symptoms and signs involving the nervous system  Frontal lobe and executive function deficit  Need for assistance with personal care  Rationale for Evaluation and Treatment: Rehabilitation  SUBJECTIVE:   SUBJECTIVE STATEMENT: Pt arrived late for OT evaluation  today and assistance was sought from patient's mother for answering questions for timeliness of session due to pt's aphasia limiting ability to answer questions at times.  Pt lives with mother and has 2 children and was completely independent prior to this stroke.   Pt accompanied by: family member - mother Duwayne Heck  PERTINENT HISTORY:   PMHx: Acute ischemic L MCA stroke, pneumonia d/t dysphagia, lupus anticoagulant disorder, Thrombocytopenia, Moderate protein malnutrition PEG tube, h/o pulmonary embolism, anemia   Presented to ER 02/21/2023 with expressive aphasia and right side weakness.  Cranial CT scan showed mild loss of gray-Fentress differentiation left parietal temporal region representing left MCA territory acute infarct versus artifact.  No acute hemorrhage.  CTA showed occluded left proximal to mid M2 MCA.  MRI follow-up showed fairly extensive acute left MCA territory and watershed territory infarcts within the left cerebral hemisphere.  Small acute right frontal parietal cortical/subcortical infarcts also present.  Underwent left common carotid arteriogram per interventional radiology with attempted mechanical thrombectomy which unfortunately was unsuccessful. EEG negative for seizure but did show diffuse encephalopathy.  A repeat MRI was completed for stroke follow-up 02/27/2023 showing increased acute infarcts in left posterior frontal and parietal lobes, anterior left frontal lobe and right frontal lobe, and additional punctate acute infarcts in left occipital, left frontal lobe and medial parietal lobe. Therapy evaluations completed due to patient decreased functional mobility was admitted for a comprehensive rehab program 03/25/23 - 04/23/23.   Pt returned to the hospital 05/06/23 after a follow-up visit with her PCP and outpatient labs showing platelet count of 15k.  ED physician consulted Dr. Cherly Hensen with heme-onc who recommended giving 1 unit platelets and since patient was found to have  deficiency of B12, folate, and iron, [facility] called to admit. Hospitalized 05/06/23 - 05/15/23 with thrombocytopenia, lupus anticoagulant disorder, history of pulmonary embolism, normocytic anemia, history of stroke, symptomatic anemia, antiphospholipid antibody syndrome.  DIAGNOSTIC FINDINGS: 04/18/23 brain MRI IMPRESSION: 1. Large area of encephalomalacia and gliosis in the left frontotemporal parietal and insular region consistent with known prior left MCA territory infarct. Within that area, multiple small focal areas of restricted diffusion are seen, suggestive of acute on chronic process. 2. Other areas of encephalomalacia and gliosis in the left frontal lobe as well as in the left caudate and right ACA/MCA watershed region.  PRECAUTIONS: Fall, R hemiparesis, aphasic  WEIGHT BEARING RESTRICTIONS: No  PAIN:  Are you having pain? No  FALLS: Has patient fallen in last 6 months? No  LIVING ENVIRONMENT: Lives with: lives with their family (parents and children, son,  Illa Level and daughter 5yo Lives in: House/apartment Stairs: Yes: External: 14 steps; on right going up Has following equipment at home: Hemi walker, Wheelchair (manual), bed side commode, and mom helps her sit in tub  PLOF: Independent, Production designer, theatre/television/film at [employer]   PATIENT GOALS: To be able to use her arms/hands better - especially the R side  OBJECTIVE:  Note: Objective measures were completed at Evaluation unless otherwise noted.  HAND DOMINANCE: Right  ADLs: Overall ADLs: Assisted by mother Transfers/ambulation related to ADLs:  Constant presence of caregiver - CGA per PT report Eating: Assistance for setup, also has PEG tube Grooming: Assist from family/mother UB Dressing: Assist from  family/mother LB Dressing: Assist from family/mother Toileting: Assist from family/mother Bathing: Assist from family/mother Tub Shower transfers: Assist from family/mother Equipment: none - mother describes helping patient get in/out  of the tub  Per hospital OT record: pt was discharged from CIR home with sister and receives [facility] aide assistance 7 days per week with 24 hr sup/assistance due to effects of R sided hemiplegia, cognitive and language deficits. Patient now presents with (see current problem list and functional status below) cognitive/language, R sided weakness, sensation, motor planning, coordination, pain and balance and activity tolerance impairments impacting BADL's and transfers/mobility. Patient utilizes manual w/c primarily and has hemi walker for standing and assisted amb +2. Pt continues to require Acute OT services while in hospital setting. Recommending home with 24 hr S and assistance from family and hired PCA with HHOT services required.  Overall ADL's : Needs assistance/impaired Eating/Feeding: Set up Eating/Feeding Details (indicate cue type and reason): R hand dominant with impairment Grooming: Wash/dry hands;Wash/dry face;Oral care;Contact guard assist;Sitting;Cueing for compensatory techniques;Cueing for sequencing Upper Body Bathing: Minimal assistance;Sitting Lower Body Bathing: Moderate assistance;Sitting/lateral leans;Sit to/from stand Upper Body Dressing : Minimal assistance;Cueing for compensatory techniques;Sitting Lower Body Dressing: Moderate assistance;Sitting/lateral leans;Sit to/from stand Toilet Transfer: Dealer Details (indicate cue type and reason): verbal and visual cues Toileting- Clothing Manipulation and Hygiene: Minimal assistance Toileting - Clothing Manipulation Details (indicate cue type and reason): unable to manage fasteners due to R sided hemi Functional mobility during ADLs: Contact guard assist General ADL Comments: R hemiplegia limits all BADL's IADLs: Dependent on caregivers Shopping: can't drive Light housekeeping: Unable from WC level Meal Prep: Dep Community mobility: Dep in [facility] Medication management: Dep Financial management:  Dep Handwriting:  TBA  MOBILITY STATUS: Needs Assist: WC  POSTURE COMMENTS:  rounded shoulders, forward head, increased thoracic kyphosis, posterior pelvic tilt, flexed trunk , and weight shift right Sitting balance: Supports self with one extremity  ACTIVITY TOLERANCE: Activity tolerance: Fair  FUNCTIONAL OUTCOME MEASURES: Quick Dash: 63.6  UPPER EXTREMITY ROM:    Active ROM Right eval Left eval  Shoulder flexion Very limited <10*   Shoulder abduction    Shoulder adduction    Shoulder extension    Shoulder internal rotation    Shoulder external rotation    Elbow flexion Some gravity eliminated motions   Elbow extension    Wrist flexion    Wrist extension    Wrist ulnar deviation    Wrist radial deviation    Wrist pronation    Wrist supination    (Blank rows = not tested)  Unable to make full composite grasp with R fingers/hand  UPPER EXTREMITY MMT:     MMT Right eval Left eval  Shoulder flexion 1 3+  Shoulder abduction    Shoulder adduction    Shoulder extension    Shoulder internal rotation    Shoulder external rotation    Middle trapezius    Lower trapezius    Elbow flexion 2- 3+  Elbow extension    Wrist flexion    Wrist extension    Wrist ulnar deviation    Wrist radial deviation    Wrist pronation    Wrist supination    (Blank rows = not tested)  HAND FUNCTION: Grip strength: Right: 0 lbs; Left: 20.9 lbs  COORDINATION: Able to hold a block and release it once placed in her fingers.  SENSATION: Light touch: Impaired  - pt reports "dull"  EDEMA: RUE - mild  MUSCLE TONE: RUE: Hypotonic  COGNITION: Overall cognitive status: Impaired/Aphasic  VISION:  Subjective report: No changes by reports Baseline vision: No visual deficits Visual history: NA  VISION ASSESSMENT: Not tested  Patient has difficulty with following activities due to following visual impairments: NA  Per recent hospital OT note: Perception Perception:  Impaired Preception Impairment Details: Inattention/Neglect Perception-Other Comments: R sided    Praxis Praxis: Impaired Praxis Impairment Details: Motor planning, Perseveration    OBSERVATIONS: Pt arrived in her [facility] with assistance of mother for mobility. The pt appears well kept and had R wrist brace donned.                                                                                                                             TREATMENT DATE: NA due to payor source   PATIENT EDUCATION: Education details: OT role and POC considerations Person educated: Patient and Parent Education method: Explanation and Verbal cues Education comprehension: verbalized understanding, verbal cues required, and needs further education  HOME EXERCISE PROGRAM: TBD  GOALS: Goals reviewed with patient? No - limited by late arrival - will review at first visit.  SHORT TERM GOALS: Target date: 07/02/23  Patient will demonstrate initial R UE HEP with 25% verbal cues or less for proper execution.  Baseline: New to outpt OT  Goal status: INITIAL  2.  Patient will demonstrate at least a registrable RUE grip strength as needed to stabilize items. Baseline: 0 lbs but could hold block at eval Goal status: INITIAL  3.  Patient will demonstrate shoulder elevation/flexion to table top to assist with ADLs etc Baseline: Strength 1/5 Goal status: INITIAL  4.  Pt/caregivers will verbalize understanding of adapted strategies and/or equipment PRN to increase safety and independence with ADLs and IADLs. Baseline: Pt assisted with all ADLs from [facility] level. Goal status: INITIAL  5.  Pt will verbalize understanding of sleep positioning strategies to protect BUE joints, decrease pain, and improve sleep quality. Baseline: New to outpt OT. Pt reported severe difficulty with sleep secondary to pain. Goal status: INITIAL   LONG TERM GOALS: Target date: 08/01/23  Patient will demonstrate updated B UE HEP with visual  handouts only for proper execution.  Baseline: New to outpt OT  Goal status: INITIAL  2.  Patient will demonstrate at least 5-10 lbs RUE grip strength as needed to open jars and other containers. Baseline: 0 at eval but able to hold block Goal status: INITIAL  3.  Pt will be able to place at least 5 blocks using right hand with elbow support. Baseline: Held block placed in her fingers at eval. Goal status: INITIAL  4.  Pt will be independent with splint wear and care for RUE  Baseline: Arrived in wrist cock up splint at eval Goal status: INITIAL  5.  Patient will demonstrate improvement with quick Dash score (reporting 50% disability or less) indicating improved functional use of affected extremity. Baseline: QuickDash 63.6 Goal status: INITIAL   ASSESSMENT:  CLINICAL  IMPRESSION: Patient is a 24 y.o. female who was seen today for occupational therapy evaluation for RUE dysfunction s/p CVA 02/21/23 with aphasia and dysphagia also. Hx includes Acute ischemic L MCA stroke, pneumonia d/t dysphagia, lupus anticoagulant disorder, Thrombocytopenia, Moderate protein malnutrition PEG tube, h/o pulmonary embolism, and anemia. Patient currently presents significantly below baseline level of function in ADLs, IADLs and ability to work demonstrating RUE and communication deficits and impairments as noted in objective data. Pt would benefit from skilled OT services in the outpatient setting to work on impairments as noted below to help pt return to PLOF as able.   PERFORMANCE DEFICITS: in functional skills including ADLs, IADLs, coordination, dexterity, proprioception, sensation, tone, ROM, strength, pain, fascial restrictions, muscle spasms, flexibility, Fine motor control, Gross motor control, mobility, balance, decreased knowledge of precautions, decreased knowledge of use of DME, and UE functional use, cognitive skills including attention, learn, memory, orientation, problem solving, safety  awareness, and understand, and psychosocial skills including coping strategies, environmental adaptation, habits, interpersonal interactions, and routines and behaviors.   IMPAIRMENTS: are limiting patient from ADLs, IADLs, rest and sleep, work, leisure, and social participation.   CO-MORBIDITIES: may have co-morbidities  that affects occupational performance. Patient will benefit from skilled OT to address above impairments and improve overall function.  MODIFICATION OR ASSISTANCE TO COMPLETE EVALUATION: Min-Moderate modification of tasks or assist with assess necessary to complete an evaluation.  OT OCCUPATIONAL PROFILE AND HISTORY: Problem focused assessment: Including review of records relating to presenting problem.  CLINICAL DECISION MAKING: Moderate - several treatment options, min-mod task modification necessary  REHAB POTENTIAL: Good  EVALUATION COMPLEXITY: Low    PLAN:  OT FREQUENCY: 1-2x/week  OT DURATION: 8 weeks (up to 10 sessions)  PLANNED INTERVENTIONS: 97535 self care/ADL training, 16109 therapeutic exercise, 97530 therapeutic activity, 97112 neuromuscular re-education, 97140 manual therapy, 97113 aquatic therapy, 97032 electrical stimulation (manual), 97014 electrical stimulation unattended, 97760 Orthotics management and training, 60454 Subsequent splinting/medication, passive range of motion, functional mobility training, visual/perceptual remediation/compensation, energy conservation, coping strategies training, patient/family education, and DME and/or AE instructions  RECOMMENDED OTHER SERVICES: Pt awaiting SLP evaluation in the month ahead and already had PT eval in outpt setting  CONSULTED AND AGREED WITH PLAN OF CARE: Patient and family member/caregiver  PLAN FOR NEXT SESSION:  RUE active grip/coordination activities (soft squeezy/inflated glove) Table top slides Cane pushes ADL AE considerations as needed (Review Modified Barthel for ADL score)  Schedule  2x/week and drop to 1x/week  Victorino Sparrow, OT 06/03/2023, 7:05 PM

## 2023-06-04 ENCOUNTER — Encounter: Payer: Self-pay | Admitting: Hematology

## 2023-06-04 DIAGNOSIS — D689 Coagulation defect, unspecified: Secondary | ICD-10-CM | POA: Insufficient documentation

## 2023-06-04 DIAGNOSIS — D693 Immune thrombocytopenic purpura: Secondary | ICD-10-CM | POA: Insufficient documentation

## 2023-06-04 DIAGNOSIS — N920 Excessive and frequent menstruation with regular cycle: Secondary | ICD-10-CM | POA: Insufficient documentation

## 2023-06-04 MED ORDER — LIDOCAINE-PRILOCAINE 2.5-2.5 % EX CREA: TOPICAL_CREAM | CUTANEOUS | 3 refills | Status: DC

## 2023-06-05 ENCOUNTER — Ambulatory Visit

## 2023-06-10 ENCOUNTER — Ambulatory Visit: Admitting: Physical Therapy

## 2023-06-10 ENCOUNTER — Ambulatory Visit: Admitting: Occupational Therapy

## 2023-06-10 ENCOUNTER — Telehealth: Payer: Self-pay | Admitting: Hematology

## 2023-06-10 DIAGNOSIS — R293 Abnormal posture: Secondary | ICD-10-CM | POA: Diagnosis not present

## 2023-06-10 DIAGNOSIS — R278 Other lack of coordination: Secondary | ICD-10-CM

## 2023-06-10 DIAGNOSIS — R2681 Unsteadiness on feet: Secondary | ICD-10-CM

## 2023-06-10 DIAGNOSIS — M6281 Muscle weakness (generalized): Secondary | ICD-10-CM

## 2023-06-10 DIAGNOSIS — R2689 Other abnormalities of gait and mobility: Secondary | ICD-10-CM

## 2023-06-10 NOTE — Therapy (Signed)
 OUTPATIENT PHYSICAL THERAPY NEURO TREATMENT   Patient Name: NEIRA BENTSEN MRN: [id number] DOB:2000-10-31, 23 y.o., female 44 Date: 06/10/2023   PCP: [name], MD REFERRING PROVIDER: [name], MD  END OF SESSION:  PT End of Session - 06/10/23 1450     Visit Number 3    Number of Visits 13    Date for PT Re-Evaluation 07/04/23    Authorization Type [place] medicaid prepaid    PT Start Time 1445    PT Stop Time 1531    PT Time Calculation (min) 46 min    Equipment Utilized During Treatment Gait belt;Other (comment)   R AFO   Activity Tolerance Patient tolerated treatment well    Behavior During Therapy [facility] for tasks assessed/performed              Past Medical History:  Diagnosis Date   Bacterial vaginitis 07/10/2016   Candida vaginitis 07/24/2016   Headache in pregnancy, antepartum, third trimester 07/24/2016   Kell isoimmunization during pregnancy    Past Surgical History:  Procedure Laterality Date   CESAREAN SECTION N/A 09/04/2016   Procedure: CESAREAN SECTION;  Surgeon: Levie Heritage, DO;  Location: [facility];  Service: Obstetrics;  Laterality: N/A;   CESAREAN SECTION Bilateral    IR CT HEAD LTD  02/21/2023   IR GASTROSTOMY TUBE MOD SED  03/21/2023   IR GASTROSTOMY TUBE REMOVAL  05/14/2023   IR PERCUTANEOUS ART THROMBECTOMY/INFUSION INTRACRANIAL INC DIAG ANGIO  02/21/2023   IR REPLC GASTRO/COLONIC TUBE PERCUT W/FLUORO  03/27/2023   RADIOLOGY WITH ANESTHESIA N/A 02/21/2023   Procedure: IR WITH ANESTHESIA;  Surgeon: Julieanne Cotton, MD;  Location: [facility];  Service: Radiology;  Laterality: N/A;   Patient Active Problem List   Diagnosis Date Noted   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) 06/04/2023   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   Antiphospholipid antibody syndrome (HCC) 05/12/2023   Symptomatic anemia 05/07/2023   History of stroke 05/06/2023   Bacteremia 03/27/2023   SIRS (systemic inflammatory  response syndrome) (HCC) 03/26/2023   Hyponatremia 03/26/2023   Ileus (HCC) 03/26/2023   Pain around PEG tube site, initial encounter 03/26/2023   Transaminitis 03/26/2023   History of pulmonary embolism 03/26/2023   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   Agitation 03/04/2023   Urinary tract infection without hematuria 03/04/2023   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/04/2023   Dysphagia 03/04/2023   Vitamin B12 deficiency 03/04/2023   Acute ischemic left MCA stroke (HCC) 02/21/2023   Middle cerebral artery embolism, left 02/21/2023   Community acquired pneumonia of right lower lobe of lung 04/10/2022   Iron deficiency anemia 04/09/2022   Folic acid deficiency 04/09/2022   Acute pulmonary embolism (HCC) 04/08/2022   Normocytic anemia 04/08/2022   Thrombocytopenia (HCC) 04/08/2022   Hypokalemia 04/08/2022   Moderate protein malnutrition (HCC) 04/08/2022   Nexplanon insertion 08/05/2017   PPH (postpartum hemorrhage) 08/04/2017   VBAC (vaginal birth after Cesarean) 08/04/2017   Abnormal fetal position 07/09/2017   Limited prenatal care in third trimester 07/09/2017   Anemia in pregnancy 06/02/2017   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   Thrombocytopenia affecting pregnancy, antepartum (HCC) 03/06/2017   Supervision of other normal pregnancy, antepartum 02/28/2017   Biological false positive RPR test 02/28/2017   H/O cesarean section 02/28/2017   Mild tetrahydrocannabinol (THC) abuse 04/25/2016   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   Maternal varicella, non-immune 02/08/2016    ONSET DATE: 05/15/23 referral  REFERRING DIAG: D69.6 (ICD-10-CM) -  Thrombocytopenia (HCC)   THERAPY DIAG:  Muscle weakness (generalized)  Other lack of coordination  Unsteadiness on feet  Other abnormalities of gait and mobility  Rationale for Evaluation and Treatment: Rehabilitation  SUBJECTIVE:                                                                                                                                                                                              SUBJECTIVE STATEMENT: Patient arrives to clinic with. Unable to tell therapist birth day.   Pt accompanied by: family member, CP  PERTINENT HISTORY: kell isoimmunization during pregnancy, L MCA CVA  PAIN:  Are you having pain? No  PRECAUTIONS: Fall and Other: R hemiparesis, aphasic   PATIENT GOALS: "to walk"   OBJECTIVE:  Note: Objective measures were completed at Evaluation unless otherwise noted.  DIAGNOSTIC FINDINGS: 04/18/23 brain MRI IMPRESSION: 1. Large area of encephalomalacia and gliosis in the left frontotemporal parietal and insular region consistent with known prior left MCA territory infarct. Within that area, multiple small focal areas of restricted diffusion are seen, suggestive of acute on chronic process. 2. Other areas of encephalomalacia and gliosis in the left frontal lobe as well as in the left caudate and right ACA/MCA watershed region.                                                                                                                              TREATMENT Ther Act  Zollie Scale present to assess for AFO   Orthotic fit  Gait pattern: {gait characteristics:25376} Distance walked: 115'  Assistive device utilized: {Assistive devices:23999} Level of assistance: {Levels of assistance:24026} Comments: [place] WalkOn AFO (medial strut)   Gait pattern: {gait characteristics:25376} Distance walked: 115 Assistive device utilized: {Assistive devices:23999} Level of assistance: {Levels of assistance:24026} Comments: Thuasne spry step AFO (lateral strut)   Self-care/home management  Educated cousin on how to ace wrap pt's ankle  Teach back method     PATIENT EDUCATION: Education details: initial HEP, walking with assist and quality emphasis  Person educated: Patient and mom Education method: Explanation and  Demonstration Education comprehension: verbalized understanding and needs further education  HOME EXERCISE PROGRAM: -walking at home with assist and aircast Access Code: [email or web address] URL: [email or web address] Date: 06/03/2023 Prepared by: Merry Lofty  Exercises - Sit to Stand with Counter Support  - 1 x daily - 7 x weekly - 3 sets - 5 reps - Seated Hamstring Set  - 1 x daily - 7 x weekly - 3 sets - 10 reps - Supine March  - 1 x daily - 7 x weekly - 3 sets - 10 reps  GOALS: Goals reviewed with patient? Yes  SHORT TERM GOALS: Target date: 06/13/23  Pt will be independent with initial HEP for improved functional strength and gait  Baseline: to be provided Goal status: INITIAL  2.  Pt will improve 5x STS to </= 16 sec to demo improved functional LE strength and balance   Baseline: 20.69s + CGA + HW Goal status: INITIAL  3.  Patient will complete sit <> stand with LRAD and no more than CGA consistently  Baseline: MinA Goal status: INITIAL  4.  Patient will ambulate >/= 50 ft using LRAD + no more than CGA consistently  Baseline: 51ft with HW + CGA Goal status: REVISED  5.  Patient will propel herself in her wc >/=171ft ModI for improved independence within her home Baseline: ~58ft with CGA Goal status: INITIAL   LONG TERM GOALS: Target date: 07/04/23  Pt will be independent with final HEP for improved functional strength and gait  Baseline: to be provided Goal status: INITIAL  Pt will improve 5x STS to </= 13 sec to demo improved functional LE strength and balance  Baseline: 20.69s + CGA + HW Goal status: INITIAL  Patient will complete sit <> stand with LRAD and no more than supervision consistently  Baseline: MinA Goal status: INITIAL  Patient will ambulate >/= 133ft with LRAD and no more than supervision.  Baseline: 35ft HW + CGA Goal status: REVISED   ASSESSMENT:  CLINICAL IMPRESSION: Patient seen for skilled PT session with emphasis on gait  assessment + initiating HEP. She is NOT safe to be ambulating without both a knee cage and an aircast/ other brace to prevent R ankle inversion. Discussed this with mom and patient and they both verbalized understanding. With fatigue, she requires increasing assist to advance R LE past L LE and achieve even flat foot contact. PT questioning R inattention given such limited ability/desire to weight shift R and so interventions used to target this, but would likely benefit from visual feedback on such. Continue POC.    OBJECTIVE IMPAIRMENTS: Abnormal gait, decreased activity tolerance, decreased balance, decreased cognition, decreased coordination, decreased knowledge of condition, decreased knowledge of use of DME, difficulty walking, decreased strength, decreased safety awareness, impaired tone, impaired UE functional use, and improper body mechanics.   ACTIVITY LIMITATIONS: carrying, lifting, standing, squatting, stairs, transfers, bed mobility, bathing, hygiene/grooming, locomotion level, and caring for others  PARTICIPATION LIMITATIONS: meal prep, cleaning, laundry, interpersonal relationship, driving, shopping, community activity, and occupation  PERSONAL FACTORS: Age, Past/current experiences, Social background, Time since onset of injury/illness/exacerbation, and Transportation are also affecting patient's functional outcome.   REHAB POTENTIAL: Fair time since onset  CLINICAL DECISION MAKING: Stable/uncomplicated  EVALUATION COMPLEXITY: Low  PLAN:  PT FREQUENCY: 2x/week  PT DURATION: 6 weeks  PLANNED INTERVENTIONS: 97164- PT Re-evaluation, 97110-Therapeutic exercises, 97530- Therapeutic activity, [id number]- Neuromuscular re-education, 97535- Self Care, [id number]- Manual therapy, [id number]- Gait training, [phone]- Orthotic Fit/training, [phone]- Aquatic Therapy, [phone]- Electrical stimulation (manual), Patient/Family education,  Balance training, Stair training, Taping, Dry Needling, Vestibular training,  Visual/preceptual remediation/compensation, DME instructions, Wheelchair mobility training, Cryotherapy, and Moist heat  PLAN FOR NEXT SESSION: transfers, wc mobility, contact olivia re: AFO for RLE?   Jill Alexanders Paetyn Pietrzak, PT, DPT  06/10/2023, 3:45 PM

## 2023-06-10 NOTE — Telephone Encounter (Signed)
Spoke with patient mother confirming upcoming appointment

## 2023-06-12 ENCOUNTER — Ambulatory Visit: Admitting: Occupational Therapy

## 2023-06-12 ENCOUNTER — Ambulatory Visit

## 2023-06-12 VITALS — BP 120/67 | HR 94

## 2023-06-12 DIAGNOSIS — M6281 Muscle weakness (generalized): Secondary | ICD-10-CM

## 2023-06-12 DIAGNOSIS — R293 Abnormal posture: Secondary | ICD-10-CM | POA: Diagnosis not present

## 2023-06-12 DIAGNOSIS — R29818 Other symptoms and signs involving the nervous system: Secondary | ICD-10-CM

## 2023-06-12 DIAGNOSIS — R278 Other lack of coordination: Secondary | ICD-10-CM

## 2023-06-12 DIAGNOSIS — R2681 Unsteadiness on feet: Secondary | ICD-10-CM

## 2023-06-12 DIAGNOSIS — R262 Difficulty in walking, not elsewhere classified: Secondary | ICD-10-CM

## 2023-06-12 DIAGNOSIS — Z741 Need for assistance with personal care: Secondary | ICD-10-CM

## 2023-06-12 DIAGNOSIS — R208 Other disturbances of skin sensation: Secondary | ICD-10-CM

## 2023-06-12 DIAGNOSIS — R2689 Other abnormalities of gait and mobility: Secondary | ICD-10-CM

## 2023-06-12 NOTE — Therapy (Signed)
 OUTPATIENT OCCUPATIONAL THERAPY NEURO TREATMENT  Patient Name: Jacqueline [name] MRN: [id number] DOB:2000/10/28, 23 y.o., female 53 Date: 06/12/2023  PCP: Jacqueline Arbour, [name] REFERRING PROVIDER: Madaline Guthrie, [name]   END OF SESSION:  OT End of Session - 06/12/23 1429     Visit Number 2    Number of Visits 10    Authorization Type [employer]    Authorization Time Period Auth Not Reqd VL; 27    OT Start Time 1445    OT Stop Time 1540    OT Time Calculation (min) 55 min    Activity Tolerance Patient tolerated treatment well    Behavior During Therapy [facility] for tasks assessed/performed             Past Medical History:  Diagnosis Date   Bacterial vaginitis 07/10/2016   Candida vaginitis 07/24/2016   Headache in pregnancy, antepartum, third trimester 07/24/2016   Kell isoimmunization during pregnancy    Past Surgical History:  Procedure Laterality Date   CESAREAN SECTION N/A 09/04/2016   Procedure: CESAREAN SECTION;  Surgeon: Jacqueline [name];  Location: [facility];  Service: Obstetrics;  Laterality: N/A;   CESAREAN SECTION Bilateral    IR CT HEAD LTD  02/21/2023   IR GASTROSTOMY TUBE MOD SED  03/21/2023   IR GASTROSTOMY TUBE REMOVAL  05/14/2023   IR PERCUTANEOUS ART THROMBECTOMY/INFUSION INTRACRANIAL INC DIAG ANGIO  02/21/2023   IR REPLC GASTRO/COLONIC TUBE PERCUT W/FLUORO  03/27/2023   RADIOLOGY WITH ANESTHESIA N/A 02/21/2023   Procedure: IR WITH ANESTHESIA;  Surgeon: Jacqueline [name];  Location: [facility];  Service: Radiology;  Laterality: N/A;   Patient Active Problem List   Diagnosis Date Noted   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) 06/04/2023   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   Antiphospholipid antibody syndrome (HCC) 05/12/2023   Symptomatic anemia 05/07/2023   History of stroke 05/06/2023   Bacteremia 03/27/2023   SIRS (systemic inflammatory response syndrome) (HCC) 03/26/2023   Hyponatremia 03/26/2023   Ileus  (HCC) 03/26/2023   Pain around PEG tube site, initial encounter 03/26/2023   Transaminitis 03/26/2023   History of pulmonary embolism 03/26/2023   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   Agitation 03/04/2023   Urinary tract infection without hematuria 03/04/2023   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/04/2023   Dysphagia 03/04/2023   Vitamin B12 deficiency 03/04/2023   Acute ischemic left MCA stroke (HCC) 02/21/2023   Middle cerebral artery embolism, left 02/21/2023   Community acquired pneumonia of right lower lobe of lung 04/10/2022   Iron deficiency anemia 04/09/2022   Folic acid deficiency 04/09/2022   Acute pulmonary embolism (HCC) 04/08/2022   Normocytic anemia 04/08/2022   Thrombocytopenia (HCC) 04/08/2022   Hypokalemia 04/08/2022   Moderate protein malnutrition (HCC) 04/08/2022   Nexplanon insertion 08/05/2017   PPH (postpartum hemorrhage) 08/04/2017   VBAC (vaginal birth after Cesarean) 08/04/2017   Abnormal fetal position 07/09/2017   Limited prenatal care in third trimester 07/09/2017   Anemia in pregnancy 06/02/2017   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   Thrombocytopenia affecting pregnancy, antepartum (HCC) 03/06/2017   Supervision of other normal pregnancy, antepartum 02/28/2017   Biological false positive RPR test 02/28/2017   H/O cesarean section 02/28/2017   Mild tetrahydrocannabinol (THC) abuse 04/25/2016   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   Maternal varicella, non-immune 02/08/2016    ONSET DATE: 05/21/2023   REFERRING DIAG: I69.30 (ICD-10-CM) - Unspecified sequelae of cerebral infarction   THERAPY DIAG:  Muscle weakness (  generalized)  Other lack of coordination  Other symptoms and signs involving the nervous system  Need for assistance with personal care  Other disturbances of skin sensation  Rationale for Evaluation and Treatment: Rehabilitation  SUBJECTIVE:   SUBJECTIVE STATEMENT: Pt arrived late for OT  treatment earlier this week and again today but OTR was able to stay late to see her today.  Pt arrived with hemi-walker for mobility today instead of WC.  Discussed with pt upcoming schedule including infusions next Thursday with PT/OT cancelled to allow her enough time to rest following her appt.  Confirmed appt for Tuesday also.   Pt accompanied by: self and family member - friend - Hospital doctor and son Jacqueline [name]  PERTINENT HISTORY:   PMHx: Acute ischemic L MCA stroke, pneumonia d/t dysphagia, lupus anticoagulant disorder, Thrombocytopenia, Moderate protein malnutrition PEG tube, h/o pulmonary embolism, anemia   Presented to ER 02/21/2023 with expressive aphasia and right side weakness.  Cranial CT scan showed mild loss of gray-Senn differentiation left parietal temporal region representing left MCA territory acute infarct versus artifact.  No acute hemorrhage.  CTA showed occluded left proximal to mid M2 MCA.  MRI follow-up showed fairly extensive acute left MCA territory and watershed territory infarcts within the left cerebral hemisphere.  Small acute right frontal parietal cortical/subcortical infarcts also present.  Underwent left common carotid arteriogram per interventional radiology with attempted mechanical thrombectomy which unfortunately was unsuccessful. EEG negative for seizure but did show diffuse encephalopathy.  A repeat MRI was completed for stroke follow-up 02/27/2023 showing increased acute infarcts in left posterior frontal and parietal lobes, anterior left frontal lobe and right frontal lobe, and additional punctate acute infarcts in left occipital, left frontal lobe and medial parietal lobe. Therapy evaluations completed due to patient decreased functional mobility was admitted for a comprehensive rehab program 03/25/23 - 04/23/23.   Pt returned to the hospital 05/06/23 after a follow-up visit with her PCP and outpatient labs showing platelet count of 15k.  ED physician consulted Jacqueline [name]  with heme-onc who recommended giving 1 unit platelets and since patient was found to have deficiency of B12, folate, and iron, [facility] called to admit. Hospitalized 05/06/23 - 05/15/23 with thrombocytopenia, lupus anticoagulant disorder, history of pulmonary embolism, normocytic anemia, history of stroke, symptomatic anemia, antiphospholipid antibody syndrome.  DIAGNOSTIC FINDINGS: 04/18/23 brain MRI IMPRESSION: 1. Large area of encephalomalacia and gliosis in the left frontotemporal parietal and insular region consistent with known prior left MCA territory infarct. Within that area, multiple small focal areas of restricted diffusion are seen, suggestive of acute on chronic process. 2. Other areas of encephalomalacia and gliosis in the left frontal lobe as well as in the left caudate and right ACA/MCA watershed region.  PRECAUTIONS: Fall, R hemiparesis, aphasic  WEIGHT BEARING RESTRICTIONS: No  PAIN:  Are you having pain? No  FALLS: Has patient fallen in last 6 months? No  LIVING ENVIRONMENT: Lives with: lives with their family (parents and children, son,  Illa Level and daughter 5yo Lives in: House/apartment Stairs: Yes: External: 14 steps; on right going up Has following equipment at home: Hemi walker, Wheelchair (manual), bed side commode, and mom helps her sit in tub  PLOF: Independent, Production designer, theatre/television/film at [employer]   PATIENT GOALS: To be able to use her arms/hands better - especially the R side  OBJECTIVE:  Note: Objective measures were completed at Evaluation unless otherwise noted.  HAND DOMINANCE: Right  ADLs: Overall ADLs: Assisted by mother Transfers/ambulation related to ADLs:  Constant presence of caregiver -  CGA per PT report Eating: Assistance for setup, also has PEG tube Grooming: Assist from family/mother UB Dressing: Assist from family/mother LB Dressing: Assist from family/mother Toileting: Assist from family/mother Bathing: Assist from family/mother Tub Shower  transfers: Assist from family/mother Equipment: none - mother describes helping patient get in/out of the tub  Per hospital OT record: pt was discharged from CIR home with sister and receives [facility] aide assistance 7 days per week with 24 hr sup/assistance due to effects of R sided hemiplegia, cognitive and language deficits. Patient now presents with (see current problem list and functional status below) cognitive/language, R sided weakness, sensation, motor planning, coordination, pain and balance and activity tolerance impairments impacting BADL's and transfers/mobility. Patient utilizes manual w/c primarily and has hemi walker for standing and assisted amb +2. Pt continues to require Acute OT services while in hospital setting. Recommending home with 24 hr S and assistance from family and hired PCA with HHOT services required.  Overall ADL's : Needs assistance/impaired Eating/Feeding: Set up Eating/Feeding Details (indicate cue type and reason): R hand dominant with impairment Grooming: Wash/dry hands;Wash/dry face;Oral care;Contact guard assist;Sitting;Cueing for compensatory techniques;Cueing for sequencing Upper Body Bathing: Minimal assistance;Sitting Lower Body Bathing: Moderate assistance;Sitting/lateral leans;Sit to/from stand Upper Body Dressing : Minimal assistance;Cueing for compensatory techniques;Sitting Lower Body Dressing: Moderate assistance;Sitting/lateral leans;Sit to/from stand Toilet Transfer: Dealer Details (indicate cue type and reason): verbal and visual cues Toileting- Clothing Manipulation and Hygiene: Minimal assistance Toileting - Clothing Manipulation Details (indicate cue type and reason): unable to manage fasteners due to R sided hemi Functional mobility during ADLs: Contact guard assist General ADL Comments: R hemiplegia limits all BADL's IADLs: Dependent on caregivers Shopping: can't drive Light housekeeping: Unable from WC  level Meal Prep: Dep Community mobility: Dep in [facility] Medication management: Dep Financial management: Dep Handwriting:  TBA  MOBILITY STATUS: Needs Assist: WC  POSTURE COMMENTS:  rounded shoulders, forward head, increased thoracic kyphosis, posterior pelvic tilt, flexed trunk , and weight shift right Sitting balance: Supports self with one extremity  ACTIVITY TOLERANCE: Activity tolerance: Fair  FUNCTIONAL OUTCOME MEASURES: Quick Dash: 63.6  UPPER EXTREMITY ROM:    Active ROM Right eval Left eval  Shoulder flexion Very limited <10*   Shoulder abduction    Shoulder adduction    Shoulder extension    Shoulder internal rotation    Shoulder external rotation    Elbow flexion Some gravity eliminated motions   Elbow extension    Wrist flexion    Wrist extension    Wrist ulnar deviation    Wrist radial deviation    Wrist pronation    Wrist supination    (Blank rows = not tested)  Unable to make full composite grasp with R fingers/hand  UPPER EXTREMITY MMT:     MMT Right eval Left eval  Shoulder flexion 1 3+  Shoulder abduction    Shoulder adduction    Shoulder extension    Shoulder internal rotation    Shoulder external rotation    Middle trapezius    Lower trapezius    Elbow flexion 2- 3+  Elbow extension    Wrist flexion    Wrist extension    Wrist ulnar deviation    Wrist radial deviation    Wrist pronation    Wrist supination    (Blank rows = not tested)  HAND FUNCTION: Grip strength: Right: 0 lbs; Left: 20.9 lbs  COORDINATION: Able to hold a block and release it once placed in her fingers.  SENSATION: Light touch: Impaired  - pt reports "dull"  EDEMA: RUE - mild  MUSCLE TONE: RUE: Hypotonic  COGNITION: Overall cognitive status: Impaired/Aphasic  VISION:  Subjective report: No changes by reports Baseline vision: No visual deficits Visual history: NA  VISION ASSESSMENT: Not tested  Patient has difficulty with following activities due  to following visual impairments: NA  Per recent hospital OT note: Perception Perception: Impaired Preception Impairment Details: Inattention/Neglect Perception-Other Comments: R sided    Praxis Praxis: Impaired Praxis Impairment Details: Motor planning, Perseveration    OBSERVATIONS: Pt arrived in her WC with assistance of mother for mobility. The pt appears well kept and had R wrist brace donned.                                                                                                                             TREATMENT DATE:   Splint: Pt has a large wrist splint that does not really allow her to use her thumb/fingers at rest, therefore trialled different splints with thumb spice not effective for functional grasp/release but able to find a small wrist splint to use in the daytime with larger splint recommended at night to keep her fingers straighter.   Therapeutic activities completed for grasp and release of small objects.  Task completed with wrist supported in neutral Pt engaged in picking up small colored sorting animals with cues for sustained grip until instructed to releases/drop object.  Neuromuscular reeducation provided, including specific and extensive visual, tactile and verbal cues and physical assistance to stimulate the neuromuscular system and promote functional ROM, movement and use of R UE for activities such as grasp and release activities, elbow flexion/extension and shoulder ROM (push/pull across tabletop) including the following activities printed for pt's initial outpt HEP   - Seated Shoulder Flexion Towel Slide at Table Top  - pt shown how to use LUE to assist with pushing R arm forward on table top and is encouraged to make the R arm move and to keep body stationary to pull back  - Seated Elbow Flexion Shoulder Internal Rotation AAROM at Table with Towel  - pt shown how to pull R arm towards herself on table top as well as in gravity eliminated plane with  support from OTR and/or friend who accompanied her today.  Pt had difficulty with isolating push and pull motions ie) frequently pulled more when asked to push and needed extra time, cues and instructions to improve appropriate AAROM  - Standing Shoulder Shrugs  - recommended pt perform this motion in the mirror for visual feedback as R shoulder is slow to engage motion and mirror helps her try and move arm in unison with unaffected side  - Supine Shoulder Flexion AAROM with Hands Clasped  - pt encouraged to hole her wrist for more stability with this motion while laying in the bed  - Seated Gripping Towel  - pt provided softer items to squeeze.  Despite inability to register  grip on dynamometer, she is able to dent soft squeezy party favor and semi-inflated glove.  Cues provided to minimize wrist flexion during this task in order to maximize position of fingers for improved grip.  PATIENT EDUCATION: Education details: RUE AAROM/AROM activities Person educated: Patient and friend Education method: Explanation, Demonstration, Tactile cues, Verbal cues, and Handouts Education comprehension: verbalized understanding, returned demonstration, verbal cues required, tactile cues required, and needs further education  HOME EXERCISE PROGRAM: 06/12/23  RUE Initial HEP Access Code: [email or web address]  GOALS: Goals reviewed with patient? No - limited by late arrival - will review at first visit.  SHORT TERM GOALS: Target date: 07/02/23  Patient will demonstrate initial R UE HEP with 25% verbal cues or less for proper execution.  Baseline: New to outpt OT  Goal status: IN Progress  2.  Patient will demonstrate at least a registrable RUE grip strength as needed to stabilize items. Baseline: 0 lbs but could hold block at eval Goal status: IN Progress  3.  Patient will demonstrate shoulder elevation/flexion to table top to assist with ADLs etc Baseline: Strength 1/5 Goal status: IN Progress  4.  Pt/caregivers  will verbalize understanding of adapted strategies and/or equipment PRN to increase safety and independence with ADLs and IADLs. Baseline: Pt assisted with all ADLs from [facility] level. Goal status: INITIAL  5.  Pt will verbalize understanding of sleep positioning strategies to protect BUE joints, decrease pain, and improve sleep quality. Baseline: New to outpt OT. Pt reported severe difficulty with sleep secondary to pain. Goal status: INITIAL   LONG TERM GOALS: Target date: 08/01/23  Patient will demonstrate updated B UE HEP with visual handouts only for proper execution.  Baseline: New to outpt OT  Goal status: INITIAL  2.  Patient will demonstrate at least 5-10 lbs RUE grip strength as needed to open jars and other containers. Baseline: 0 at eval but able to hold block Goal status: IN Progress  3.  Pt will be able to place at least 5 blocks using right hand with elbow support. Baseline: Held block placed in her fingers at eval. Goal status: IN Progress  4.  Pt will be independent with splint wear and care for RUE  Baseline: Arrived in wrist cock up splint at eval Goal status: IN Progress  5.  Patient will demonstrate improvement with quick Dash score (reporting 50% disability or less) indicating improved functional use of affected extremity. Baseline: QuickDash 63.6 Goal status: INITIAL   ASSESSMENT:  CLINICAL IMPRESSION: Patient is a 23 y.o. female who was seen today for occupational therapy treatment for RUE hemiplegia s/p CVA 02/21/23 with aphasia and dysphagia also. Patient currently presents significantly below baseline level of function in ADLs, IADLs and ability to work due to RUE weakness, incoordination and limited functional use. Pt will benefit from skilled OT services in the outpatient setting to work on impairments as noted at eval to help pt return to highest level of independence possible.   PERFORMANCE DEFICITS: in functional skills including ADLs, IADLs,  coordination, dexterity, proprioception, sensation, tone, ROM, strength, pain, fascial restrictions, muscle spasms, flexibility, Fine motor control, Gross motor control, mobility, balance, decreased knowledge of precautions, decreased knowledge of use of DME, and UE functional use, cognitive skills including attention, learn, memory, orientation, problem solving, safety awareness, and understand, and psychosocial skills including coping strategies, environmental adaptation, habits, interpersonal interactions, and routines and behaviors.   IMPAIRMENTS: are limiting patient from ADLs, IADLs, rest and sleep, work, leisure, and social participation.  CO-MORBIDITIES: may have co-morbidities  that affects occupational performance. Patient will benefit from skilled OT to address above impairments and improve overall function.  REHAB POTENTIAL: Fair guarded due to attendance issues, limited visits per payor source etc  PLAN:  OT FREQUENCY: 1-2x/week  OT DURATION: 8 weeks (up to 10 sessions)  PLANNED INTERVENTIONS: 97535 self care/ADL training, 81191 therapeutic exercise, 97530 therapeutic activity, 97112 neuromuscular re-education, 97140 manual therapy, 97113 aquatic therapy, 97032 electrical stimulation (manual), 97014 electrical stimulation unattended, 97760 Orthotics management and training, 47829 Subsequent splinting/medication, passive range of motion, functional mobility training, visual/perceptual remediation/compensation, energy conservation, coping strategies training, patient/family education, and DME and/or AE instructions  RECOMMENDED OTHER SERVICES: Pt awaiting SLP evaluation in the month ahead and already had PT eval in outpt setting  CONSULTED AND AGREED WITH PLAN OF CARE: Patient and family member/caregiver  PLAN FOR NEXT SESSION:  REVIEW GOALS  Sleep positions RUE active grip/coordination activities (soft squeezy/inflated glove provided 4/10) Table top slides (initiated 4/10) Cane  pushes ADL AE considerations as needed (Review Modified Barthel for ADL score)  GS - Explore NMES/home unit  Schedule 2x/week and drop to 1x/week  Victorino Sparrow, OT 06/12/2023, 4:27 PM

## 2023-06-12 NOTE — Patient Instructions (Signed)
 Access Code: [email or web address] URL: [email or web address] Date: 06/12/2023 Prepared by: Amada Kingfisher  Exercises - Seated Shoulder Flexion Towel Slide at Table Top  - 2-3 x daily - 2 sets - 5-10 reps - Seated Elbow Flexion Shoulder Internal Rotation AAROM at Table with Towel  - 2-3 x daily - 2 sets - 5-10 reps - Standing Shoulder Shrugs  - 2-3 x daily - 2 sets - 5-10 reps - Supine Shoulder Flexion AAROM with Hands Clasped  - 2 x daily - 2-3 sets - 5-10 reps - Seated Gripping Towel  - 5-7 x daily - 1 sets - 10 reps

## 2023-06-12 NOTE — Therapy (Signed)
 OUTPATIENT PHYSICAL THERAPY NEURO TREATMENT   Patient Name: Jacqueline [name] MRN: [id number] DOB:29-Apr-2000, 23 y.o., female 59 Date: 06/12/2023   PCP: [name], MD REFERRING PROVIDER: [name], MD  END OF SESSION:  PT End of Session - 06/12/23 1407     Visit Number 4    Number of Visits 13    Date for PT Re-Evaluation 07/04/23    Authorization Type [place] medicaid prepaid    PT Start Time 1401    PT Stop Time 1445    PT Time Calculation (min) 44 min    Equipment Utilized During Treatment Gait belt    Activity Tolerance Patient tolerated treatment well    Behavior During Therapy [facility] for tasks assessed/performed              Past Medical History:  Diagnosis Date   Bacterial vaginitis 07/10/2016   Candida vaginitis 07/24/2016   Headache in pregnancy, antepartum, third trimester 07/24/2016   Kell isoimmunization during pregnancy    Past Surgical History:  Procedure Laterality Date   CESAREAN SECTION N/A 09/04/2016   Procedure: CESAREAN SECTION;  Surgeon: Levie Heritage, DO;  [facility];  Service: Obstetrics;  Laterality: N/A;   CESAREAN SECTION Bilateral    IR CT HEAD LTD  02/21/2023   IR GASTROSTOMY TUBE MOD SED  03/21/2023   IR GASTROSTOMY TUBE REMOVAL  05/14/2023   IR PERCUTANEOUS ART THROMBECTOMY/INFUSION INTRACRANIAL INC DIAG ANGIO  02/21/2023   IR REPLC GASTRO/COLONIC TUBE PERCUT W/FLUORO  03/27/2023   RADIOLOGY WITH ANESTHESIA N/A 02/21/2023   Procedure: IR WITH ANESTHESIA;  Surgeon: Julieanne Cotton, MD;  Location: [facility];  Service: Radiology;  Laterality: N/A;   Patient Active Problem List   Diagnosis Date Noted   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) 06/04/2023   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   Antiphospholipid antibody syndrome (HCC) 05/12/2023   Symptomatic anemia 05/07/2023   History of stroke 05/06/2023   Bacteremia 03/27/2023   SIRS (systemic inflammatory response syndrome) (HCC)  03/26/2023   Hyponatremia 03/26/2023   Ileus (HCC) 03/26/2023   Pain around PEG tube site, initial encounter 03/26/2023   Transaminitis 03/26/2023   History of pulmonary embolism 03/26/2023   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   Agitation 03/04/2023   Urinary tract infection without hematuria 03/04/2023   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/04/2023   Dysphagia 03/04/2023   Vitamin B12 deficiency 03/04/2023   Acute ischemic left MCA stroke (HCC) 02/21/2023   Middle cerebral artery embolism, left 02/21/2023   Community acquired pneumonia of right lower lobe of lung 04/10/2022   Iron deficiency anemia 04/09/2022   Folic acid deficiency 04/09/2022   Acute pulmonary embolism (HCC) 04/08/2022   Normocytic anemia 04/08/2022   Thrombocytopenia (HCC) 04/08/2022   Hypokalemia 04/08/2022   Moderate protein malnutrition (HCC) 04/08/2022   Nexplanon insertion 08/05/2017   PPH (postpartum hemorrhage) 08/04/2017   VBAC (vaginal birth after Cesarean) 08/04/2017   Abnormal fetal position 07/09/2017   Limited prenatal care in third trimester 07/09/2017   Anemia in pregnancy 06/02/2017   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   Thrombocytopenia affecting pregnancy, antepartum (HCC) 03/06/2017   Supervision of other normal pregnancy, antepartum 02/28/2017   Biological false positive RPR test 02/28/2017   H/O cesarean section 02/28/2017   Mild tetrahydrocannabinol (THC) abuse 04/25/2016   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   Maternal varicella, non-immune 02/08/2016    ONSET DATE: 05/15/23 referral  REFERRING DIAG: D69.6 (ICD-10-CM) - Thrombocytopenia (HCC)  THERAPY DIAG:  Muscle weakness (generalized)  Other lack of coordination  Unsteadiness on feet  Other abnormalities of gait and mobility  Abnormal posture  Difficulty in walking, not elsewhere classified  Rationale for Evaluation and Treatment: Rehabilitation  SUBJECTIVE:                                                                                                                                                                                              SUBJECTIVE STATEMENT: Patient arrives to clinic with "her girl" and her son. Ambulating into clinic using HW and no ankle brace- son carrying knee brace. Denies falls.   Pt accompanied by: friend  PERTINENT HISTORY: kell isoimmunization during pregnancy, L MCA CVA  PAIN:  Are you having pain? No  PRECAUTIONS: Fall and Other: R hemiparesis, aphasic   PATIENT GOALS: "to walk"   OBJECTIVE:  Note: Objective measures were completed at Evaluation unless otherwise noted.  DIAGNOSTIC FINDINGS: 04/18/23 brain MRI IMPRESSION: 1. Large area of encephalomalacia and gliosis in the left frontotemporal parietal and insular region consistent with known prior left MCA territory infarct. Within that area, multiple small focal areas of restricted diffusion are seen, suggestive of acute on chronic process. 2. Other areas of encephalomalacia and gliosis in the left frontal lobe as well as in the left caudate and right ACA/MCA watershed region.                                                                                                                              TREATMENT NMR -R LE biased sit <> stand  -standing weight shift R to retrieve squigz   -progressed to dual cog task naming colors of squigz with ModA     GAIT -115' HW + CGA + R AFO   -consistent multimodal cues to minimize R knee genu recurvatum  -80' LBQC + CGA + R AFO   -improved weight shift R, continued multimodal cues to facilitate R heel contact and minimize genu recurvatum  -hand off to OT   PATIENT EDUCATION: Education details: MUST wear brace/ACE wrap when  walking at home- otherwise use wheelchair  Person educated: Patient and friend Education method: Explanation, Demonstration, and Verbal cues Education comprehension: verbalized understanding, returned  demonstration, and needs further education  HOME EXERCISE PROGRAM: -walking at home with assist and aircast Access Code: [email or web address] URL: [email or web address] Date: 06/03/2023 Prepared by: Merry Lofty  Exercises - Sit to Stand with Counter Support  - 1 x daily - 7 x weekly - 3 sets - 5 reps - Seated Hamstring Set  - 1 x daily - 7 x weekly - 3 sets - 10 reps - Supine March  - 1 x daily - 7 x weekly - 3 sets - 10 reps  GOALS: Goals reviewed with patient? Yes  SHORT TERM GOALS: Target date: 06/13/23  Pt will be independent with initial HEP for improved functional strength and gait  Baseline: to be provided Goal status: INITIAL  2.  Pt will improve 5x STS to </= 16 sec to demo improved functional LE strength and balance   Baseline: 20.69s + CGA + HW Goal status: INITIAL  3.  Patient will complete sit <> stand with LRAD and no more than CGA consistently  Baseline: MinA Goal status: INITIAL  4.  Patient will ambulate >/= 50 ft using LRAD + no more than CGA consistently  Baseline: 34ft with HW + CGA Goal status: REVISED  5.  Patient will propel herself in her wc >/=164ft ModI for improved independence within her home Baseline: ~70ft with CGA Goal status: INITIAL   LONG TERM GOALS: Target date: 07/04/23  Pt will be independent with final HEP for improved functional strength and gait  Baseline: to be provided Goal status: INITIAL  Pt will improve 5x STS to </= 13 sec to demo improved functional LE strength and balance  Baseline: 20.69s + CGA + HW Goal status: INITIAL  Patient will complete sit <> stand with LRAD and no more than supervision consistently  Baseline: MinA Goal status: INITIAL  Patient will ambulate >/= 127ft with LRAD and no more than supervision.  Baseline: 75ft HW + CGA Goal status: REVISED   ASSESSMENT:  CLINICAL IMPRESSION: Patient seen for skilled PT session with emphasis on continued R hemi NMR and gait retraining. HW does  encourage minimizing R lateral weight shift during gait and stance- trialed [facility] and this did improve minimally. Patient and friend report that they did go to [facility] after last appt. She remains UNSAFE to ambulate 1. Alone, 2. Without air case/AFO/ ACE wrap to R ankle. Discussed with patient and friend "CP" and they both verbalized understanding. Continue POC.    OBJECTIVE IMPAIRMENTS: Abnormal gait, decreased activity tolerance, decreased balance, decreased cognition, decreased coordination, decreased knowledge of condition, decreased knowledge of use of DME, difficulty walking, decreased strength, decreased safety awareness, impaired tone, impaired UE functional use, and improper body mechanics.   ACTIVITY LIMITATIONS: carrying, lifting, standing, squatting, stairs, transfers, bed mobility, bathing, hygiene/grooming, locomotion level, and caring for others  PARTICIPATION LIMITATIONS: meal prep, cleaning, laundry, interpersonal relationship, driving, shopping, community activity, and occupation  PERSONAL FACTORS: Age, Past/current experiences, Social background, Time since onset of injury/illness/exacerbation, and Transportation are also affecting patient's functional outcome.   REHAB POTENTIAL: Fair time since onset  CLINICAL DECISION MAKING: Stable/uncomplicated  EVALUATION COMPLEXITY: Low  PLAN:  PT FREQUENCY: 2x/week  PT DURATION: 6 weeks  PLANNED INTERVENTIONS: 97164- PT Re-evaluation, 97110-Therapeutic exercises, 97530- Therapeutic activity, [id number]- Neuromuscular re-education, 97535- Self Care, 16109- Manual therapy, [id number]- Gait training, [phone]- Orthotic Fit/training, [id number]- Aquatic Therapy, [phone]- Electrical stimulation (manual),  Patient/Family education, Balance training, Stair training, Taping, Dry Needling, Vestibular training, Visual/preceptual remediation/compensation, DME instructions, Wheelchair mobility training, Cryotherapy, and Moist heat  PLAN FOR NEXT SESSION: Did we get  AFO order? transfers, wc mobility, weight shift to R, gait with [facility] vs HW, CHECK GOALS   Westley Foots, PT Westley Foots, PT, DPT, CBIS   06/12/2023, 3:06 PM

## 2023-06-12 NOTE — Progress Notes (Signed)

## 2023-06-13 ENCOUNTER — Telehealth: Payer: Self-pay | Admitting: Registered Nurse

## 2023-06-13 ENCOUNTER — Telehealth: Payer: Self-pay

## 2023-06-13 ENCOUNTER — Inpatient Hospital Stay: Payer: Self-pay | Admitting: Neurology

## 2023-06-13 DIAGNOSIS — I63512 Cerebral infarction due to unspecified occlusion or stenosis of left middle cerebral artery: Secondary | ICD-10-CM

## 2023-06-13 NOTE — Telephone Encounter (Signed)
 Jacalyn Lefevre, NP,  Renate Naples is being treated by physical therapy for gait abnormalities s/p CVA.  She will benefit from use of a R AFO in order to improve safety with functional mobility.    If you agree, please submit request in EPIC under MD Order, Other Orders (list R AFO in comments) or fax to [facility] at [phone].   Thank you, Westley Foots, PT, DPT, [facility] [address] [place], [place]  [address] Phone:  [phone] Fax:  [phone]

## 2023-06-17 ENCOUNTER — Ambulatory Visit: Admitting: Occupational Therapy

## 2023-06-17 ENCOUNTER — Ambulatory Visit: Admitting: Physical Therapy

## 2023-06-17 ENCOUNTER — Telehealth: Payer: Self-pay | Admitting: Occupational Therapy

## 2023-06-17 NOTE — Telephone Encounter (Signed)
 This is documentation of attempts to call the patient s/p No-Show for OT/PT appts today.  [phone] - 1st attempt - this was answered by pt's older sister Jacqueline [name] and she provided patient's number (see below)  [phone] - attempted to call pt but no answer and no voice mail box setup  [phone] - this is listed at home phone but was not in service when this number was called  [phone] - this is listed as mother's Carliss Chess) number but attempt to call this number had recording "Call can not be completed as dialled" and unable to leave message  OTR called sister back at [phone] and asked her to contact patient/mother to confirm appts as she has OT/PT/ST next week and an infusion later this week

## 2023-06-18 ENCOUNTER — Other Ambulatory Visit: Payer: Self-pay

## 2023-06-18 DIAGNOSIS — D6861 Antiphospholipid syndrome: Secondary | ICD-10-CM

## 2023-06-18 DIAGNOSIS — D696 Thrombocytopenia, unspecified: Secondary | ICD-10-CM

## 2023-06-19 ENCOUNTER — Encounter: Admitting: Occupational Therapy

## 2023-06-19 ENCOUNTER — Telehealth: Payer: Self-pay | Admitting: Hematology

## 2023-06-19 ENCOUNTER — Ambulatory Visit

## 2023-06-19 ENCOUNTER — Inpatient Hospital Stay

## 2023-06-19 NOTE — Telephone Encounter (Signed)
 spoke with patient's mother about missed 4/17 appts. patient's mother aware of new appts on 4/18 and will inform patient.

## 2023-06-20 ENCOUNTER — Inpatient Hospital Stay

## 2023-06-20 ENCOUNTER — Inpatient Hospital Stay: Attending: Hematology

## 2023-06-20 DIAGNOSIS — R059 Cough, unspecified: Secondary | ICD-10-CM | POA: Insufficient documentation

## 2023-06-20 DIAGNOSIS — Z7952 Long term (current) use of systemic steroids: Secondary | ICD-10-CM | POA: Insufficient documentation

## 2023-06-20 DIAGNOSIS — R479 Unspecified speech disturbances: Secondary | ICD-10-CM | POA: Insufficient documentation

## 2023-06-20 DIAGNOSIS — Z833 Family history of diabetes mellitus: Secondary | ICD-10-CM | POA: Insufficient documentation

## 2023-06-20 DIAGNOSIS — Z87891 Personal history of nicotine dependence: Secondary | ICD-10-CM | POA: Insufficient documentation

## 2023-06-20 DIAGNOSIS — Z79899 Other long term (current) drug therapy: Secondary | ICD-10-CM | POA: Insufficient documentation

## 2023-06-20 DIAGNOSIS — N920 Excessive and frequent menstruation with regular cycle: Secondary | ICD-10-CM | POA: Insufficient documentation

## 2023-06-20 DIAGNOSIS — I69320 Aphasia following cerebral infarction: Secondary | ICD-10-CM | POA: Insufficient documentation

## 2023-06-20 DIAGNOSIS — D696 Thrombocytopenia, unspecified: Secondary | ICD-10-CM | POA: Insufficient documentation

## 2023-06-20 DIAGNOSIS — Z8249 Family history of ischemic heart disease and other diseases of the circulatory system: Secondary | ICD-10-CM | POA: Insufficient documentation

## 2023-06-20 DIAGNOSIS — Z86711 Personal history of pulmonary embolism: Secondary | ICD-10-CM | POA: Insufficient documentation

## 2023-06-20 DIAGNOSIS — I2699 Other pulmonary embolism without acute cor pulmonale: Secondary | ICD-10-CM | POA: Insufficient documentation

## 2023-06-20 DIAGNOSIS — Z803 Family history of malignant neoplasm of breast: Secondary | ICD-10-CM | POA: Insufficient documentation

## 2023-06-20 DIAGNOSIS — D500 Iron deficiency anemia secondary to blood loss (chronic): Secondary | ICD-10-CM | POA: Insufficient documentation

## 2023-06-20 DIAGNOSIS — Z7901 Long term (current) use of anticoagulants: Secondary | ICD-10-CM | POA: Insufficient documentation

## 2023-06-20 DIAGNOSIS — D6861 Antiphospholipid syndrome: Secondary | ICD-10-CM | POA: Insufficient documentation

## 2023-06-20 DIAGNOSIS — I69351 Hemiplegia and hemiparesis following cerebral infarction affecting right dominant side: Secondary | ICD-10-CM | POA: Insufficient documentation

## 2023-06-20 NOTE — Progress Notes (Signed)
 Pt was supposed to come yesterday for infusion and was late and unable to keep appt. Pt and Mother were informed of new scheduled appt for today. Pt was was contacted today after being over an hour late and was still at home. Pt informed we would have to postpone infusion today as it was too late. Pt's Mother stated pt was refusing to come. Pt finally agreed to come for lab work today. Appt next week already scheduled.

## 2023-06-23 NOTE — Therapy (Deleted)
 OUTPATIENT SPEECH LANGUAGE PATHOLOGY EVALUATION   Patient Name: Jacqueline [name] MRN: [id number] DOB:2001-03-01, 23 y.o., female 41 Date: 06/23/2023  PCP: [name] MD REFERRING PROVIDER: Lennon Race, MD  END OF SESSION:   Past Medical History:  Diagnosis Date   Bacterial vaginitis 07/10/2016   Candida vaginitis 07/24/2016   Headache in pregnancy, antepartum, third trimester 07/24/2016   Kell isoimmunization during pregnancy    Past Surgical History:  Procedure Laterality Date   CESAREAN SECTION N/A 09/04/2016   Procedure: CESAREAN SECTION;  Surgeon: Malka Sea, DO;  [facility]: [facility];  Service: Obstetrics;  Laterality: N/A;   CESAREAN SECTION Bilateral    IR CT HEAD LTD  02/21/2023   IR GASTROSTOMY TUBE MOD SED  03/21/2023   IR GASTROSTOMY TUBE REMOVAL  05/14/2023   IR PERCUTANEOUS ART THROMBECTOMY/INFUSION INTRACRANIAL INC DIAG ANGIO  02/21/2023   IR REPLC GASTRO/COLONIC TUBE PERCUT W/FLUORO  03/27/2023   RADIOLOGY WITH ANESTHESIA N/A 02/21/2023   Procedure: IR WITH ANESTHESIA;  Surgeon: Luellen Sages, MD;  Location: [facility];  Service: Radiology;  Laterality: N/A;   Patient Active Problem List   Diagnosis Date Noted   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) 06/04/2023   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   Antiphospholipid antibody syndrome (HCC) 05/12/2023   Symptomatic anemia 05/07/2023   History of stroke 05/06/2023   Bacteremia 03/27/2023   SIRS (systemic inflammatory response syndrome) (HCC) 03/26/2023   Hyponatremia 03/26/2023   Ileus (HCC) 03/26/2023   Pain around PEG tube site, initial encounter 03/26/2023   Transaminitis 03/26/2023   History of pulmonary embolism 03/26/2023   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   Agitation 03/04/2023   Urinary tract infection without hematuria 03/04/2023   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/04/2023   Dysphagia 03/04/2023   Vitamin B12 deficiency  03/04/2023   Acute ischemic left MCA stroke (HCC) 02/21/2023   Middle cerebral artery embolism, left 02/21/2023   Community acquired pneumonia of right lower lobe of lung 04/10/2022   Iron  deficiency anemia 04/09/2022   Folic acid  deficiency 04/09/2022   Acute pulmonary embolism (HCC) 04/08/2022   Normocytic anemia 04/08/2022   Thrombocytopenia (HCC) 04/08/2022   Hypokalemia 04/08/2022   Moderate protein malnutrition (HCC) 04/08/2022   Nexplanon  insertion 08/05/2017   PPH (postpartum hemorrhage) 08/04/2017   VBAC (vaginal birth after Cesarean) 08/04/2017   Abnormal fetal position 07/09/2017   Limited prenatal care in third trimester 07/09/2017   Anemia in pregnancy 06/02/2017   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   Thrombocytopenia affecting pregnancy, antepartum (HCC) 03/06/2017   Supervision of other normal pregnancy, antepartum 02/28/2017   Biological false positive RPR test 02/28/2017   H/O cesarean section 02/28/2017   Mild tetrahydrocannabinol (THC) abuse 04/25/2016   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   Maternal varicella, non-immune 02/08/2016    ONSET DATE: 05/20/2023 (Referral date)   REFERRING DIAG: I69.30 (ICD-10-CM) - Unspecified sequelae of cerebral infarction  THERAPY DIAG:  No diagnosis found.  Rationale for Evaluation and Treatment: Rehabilitation  SUBJECTIVE:   SUBJECTIVE STATEMENT: *** Pt accompanied by: {accompnied:27141}  PERTINENT HISTORY:  "Derriana A Ayllon is a 23 year old right-handed female with medical history significant for Kells isoimmunization pregnancy, unprovoked PE with positive lupus anticoagulant for which she stopped Eliquis  in June 2024, hemoglobin C trait, anemia iron  deficient thrombocytopenia, tobacco use. Per chart review patient lives with her 2 children ages 41 and 23. She has a boyfriend mother and sister with good support. 1 level home with 1 flight  of stairs to entry. Reportedly independent prior to admission.  Presented 02/21/2023 with expressive aphasia and right side weakness. Per chart patient admitted to drinking a significant amount of alcohol the night prior and did take Suboxone that has not been prescribed to her. Chemistries unremarkable except hemoglobin 8.4, alcohol of 14, urine drug screen negative. Cranial CT scan showed mild loss of gray-Shelden differentiation left parietal temporal region representing left MCA territory acute infarct versus artifact. No acute hemorrhage. CTA showed occluded left proximal to mid M2 MCA. MRI follow-up showed fairly extensive acute left MCA territory and watershed territory infarcts within the left cerebral hemisphere. Small acute right frontal parietal cortical/subcortical infarcts also present. Underwent left common carotid arteriogram per interventional radiology with attempted mechanical thrombectomy which unfortunately was unsuccessful Dr.Deveshwar.  A repeat MRI was completed for stroke follow-up 02/27/2023 showing increased acute infarcts in left posterior frontal and parietal lobes, anterior left frontal lobe and right frontal lobe, and additional punctate acute infarcts in left occipital, left frontal lobe and medial parietal lobe. Neurology continues to follow patient is currently maintained on Eliquis . Gastrostomy tube was placed 03/21/2023 for dysphagia per interventional radiology Dr. Mabel Savage. Her diet has since been advanced to a regular consistency. Therapy evaluations completed due to patient decreased functional mobility was admitted for a comprehensive rehab program. Pt was admitted to CIR on 03/26/23."    PAIN:  Are you having pain? {OPRCPAIN:27236}  FALLS: Has patient fallen in last 6 months?  {ZDGUYQIH:47425}  LIVING ENVIRONMENT: Lives with: lives with their family Lives in: House/apartment  PLOF:  Level of assistance: Needed assistance with ADLs, Needed assistance with IADLS Employment: {SLPemployment:25674}  PATIENT GOALS: ***  OBJECTIVE:   Note: Objective measures were completed at Evaluation unless otherwise noted.  DIAGNOSTIC FINDINGS: 04/18/23 brain MRI IMPRESSION: 1. Large area of encephalomalacia and gliosis in the left frontotemporal parietal and insular region consistent with known prior left MCA territory infarct. Within that area, multiple small focal areas of restricted diffusion are seen, suggestive of acute on chronic process. 2. Other areas of encephalomalacia and gliosis in the left frontal lobe as well as in the left caudate and right ACA/MCA watershed region.  COGNITION: Overall cognitive status: Impaired Areas of impairment:  {cognitiveimpairmentslp:27409} Functional deficits: ***  AUDITORY COMPREHENSION: Overall auditory comprehension: {IMPAIRED:25374} YES/NO questions: {IMPAIRED:25374} Following directions: {IMPAIRED:25374} Conversation: {SLP conversation:25430} Interfering components: {SLP interfering components:25431} Effective technique: {SLP effective technique:25432}  READING COMPREHENSION: {SLPreadingcomprehension:27140}  EXPRESSION: {SLP EXPRESSION:25433}  VERBAL EXPRESSION: Level of generative/spontaneous verbalization: {SLP level of generative/spontaneious verbalization:25435} Automatic speech: {SLP ATOMIC SPEECH:25434}  Repetition: {SLPrepetion:27212} Naming: {SLPnaming:27214} Pragmatics: {slppragmatics:27216} Comments: *** Interfering components: {SLP INTERFERING COMPONENTS:25436} Effective technique: {SLP EFFECTIVE TECHNIQUE:25437} Non-verbal means of communication: {SLP non verbal means of communication:25438}  WRITTEN EXPRESSION: Dominant hand: {RIGHT/LEFT:20294} Written expression: {slpwrittenexp:27209}  MOTOR SPEECH: Overall motor speech: {slpimpaired:27210} Level of impairment: {SLP level of impairment:25441} Respiration: {respbreathing:27195} Phonation: {SLP phonation:25439} Resonance: {SLP resonance:25440} Articulation: {SLParticulation:27218} Intelligibility:  {SLP Intelligible:25442} Motor planning: {slpmotorspeecherrors:27220} Motor speech errors: {SLP motor speech errors:25443} Interfering components: {SLP Interfering components (MS):25444} Effective technique: {SLP effective technique (MS):25445}  ORAL MOTOR EXAMINATION: Overall status: {OMESLP2:27645} Comments: ***  RECOMMENDATIONS FROM OBJECTIVE SWALLOW STUDY (MBSS/FEES):  *** Objective swallow impairments: *** Objective recommended compensations: ***  CLINICAL SWALLOW ASSESSMENT:   Current diet: {slpdiet:27196} Dentition: {dentition:27197} Patient directly observed with POs: {POobserved:27199} Feeding: {slp feeding:27200} Liquids provided by: {SLPliquids:27201} Oral phase signs and symptoms: {SLPoralphase:27202} Pharyngeal phase signs and symptoms: {SLPpharyngealphase:27203} Comments: ***  STANDARDIZED ASSESSMENTS: {SLPstandardizedassessment:27092}  PATIENT REPORTED OUTCOME MEASURES (PROM): {SLPPROM:27095}  TREATMENT DATE: ***   PATIENT EDUCATION: Education details: *** Person educated: {Person educated:25204} Education method: {Education Method:25205} Education comprehension: {Education Comprehension:25206}   GOALS: Goals reviewed with patient? {yes/no:20286}  SHORT TERM GOALS: Target date: ***  *** Baseline: Goal status: INITIAL  2.  *** Baseline:  Goal status: INITIAL  3.  *** Baseline:  Goal status: INITIAL  4.  *** Baseline:  Goal status: INITIAL  5.  *** Baseline:  Goal status: INITIAL  6.  *** Baseline:  Goal status: INITIAL  LONG TERM GOALS: Target date: ***  *** Baseline:  Goal status: INITIAL  2.  *** Baseline:  Goal status: INITIAL  3.  *** Baseline:  Goal status: INITIAL  4.  *** Baseline:  Goal status: INITIAL  5.  *** Baseline:  Goal status: INITIAL  6.  *** Baseline:  Goal status:  INITIAL  ASSESSMENT:  CLINICAL IMPRESSION: Patient is a *** y.o. *** who was seen today for ***.   OBJECTIVE IMPAIRMENTS: include {SLPOBJIMP:27107}. These impairments are limiting patient from {SLPLIMIT:27108}. Factors affecting potential to achieve goals and functional outcome are {SLP factors:25450}. Patient will benefit from skilled SLP services to address above impairments and improve overall function.  REHAB POTENTIAL: {rehabpotential:25112}  PLAN:  SLP FREQUENCY: {rehab frequency:25116}  SLP DURATION: {rehab duration:25117}  PLANNED INTERVENTIONS: {SLP treatment/interventions:25449}    Tamar Fairly, CCC-SLP 06/23/2023, 3:07 PM

## 2023-06-24 ENCOUNTER — Ambulatory Visit: Admitting: Physical Therapy

## 2023-06-24 ENCOUNTER — Ambulatory Visit: Admitting: Occupational Therapy

## 2023-06-24 ENCOUNTER — Ambulatory Visit

## 2023-06-25 ENCOUNTER — Inpatient Hospital Stay

## 2023-06-25 ENCOUNTER — Inpatient Hospital Stay: Admitting: Physician Assistant

## 2023-06-25 ENCOUNTER — Other Ambulatory Visit: Payer: Self-pay | Admitting: Physical Medicine and Rehabilitation

## 2023-06-25 ENCOUNTER — Telehealth: Payer: Self-pay

## 2023-06-25 DIAGNOSIS — I63512 Cerebral infarction due to unspecified occlusion or stenosis of left middle cerebral artery: Secondary | ICD-10-CM

## 2023-06-25 NOTE — Progress Notes (Signed)
 Pharmacist Chemotherapy Monitoring - Initial Assessment    Anticipated start date: 07/02/23   The following has been reviewed per standard work regarding the patient's treatment regimen: The patient's diagnosis, treatment plan and drug doses, and organ/hematologic function Lab orders and baseline tests specific to treatment regimen  The treatment plan start date, drug sequencing, and pre-medications Prior authorization status  Patient's documented medication list, including drug-drug interaction screen and prescriptions for anti-emetics and supportive care specific to the treatment regimen The drug concentrations, fluid compatibility, administration routes, and timing of the medications to be used The patient's access for treatment and lifetime cumulative dose history, if applicable  The patient's medication allergies and previous infusion related reactions, if applicable   Changes made to treatment plan:  N/A  Follow up needed:  N/A   Cherylynn Cosier, RPH, 06/25/2023  11:13 AM

## 2023-06-25 NOTE — Telephone Encounter (Signed)
 Spoke with pt's mother, Diego Foy, regarding today's no show appt.  She said they do not have a car but made arrangements for transportation but they have not shown up.  Advised her today's appt will need to be rescheduled and call was transferred to scheduling/Angel

## 2023-06-26 ENCOUNTER — Telehealth: Payer: Self-pay | Admitting: Occupational Therapy

## 2023-06-26 ENCOUNTER — Encounter: Payer: Self-pay | Admitting: Occupational Therapy

## 2023-06-26 ENCOUNTER — Ambulatory Visit: Admitting: Occupational Therapy

## 2023-06-26 ENCOUNTER — Ambulatory Visit: Admitting: Physical Therapy

## 2023-06-26 NOTE — Therapy (Deleted)
 [facility] [address] [place], [place], [address] Phone: [phone]   Fax:  [phone]  Patient Details  Name: RAYLEA ADCOX MRN: [id number] Date of Birth: [birth year] Referring Provider:  [name], MD  Encounter Date: 06/26/2023  PHYSICAL THERAPY DISCHARGE SUMMARY  Visits from Start of Care: 4  Current functional level related to goals / functional outcomes: Requires ~CGA/MinA for all functional mobility and 24/7 supervision, at least, due to speech and cognitive deficits   Remaining deficits: See eval   Education / Equipment: PT POC, use knee cage + AFO/air cast AAT with ambulation, need assistance with ambulation AAT, HEP, obtaining an AFO   Patient agrees to discharge. Patient goals were not met. Patient is being discharged due to  mom calling and reporting that patient did not have transportation.  Rebecca Campus, PT Rebecca Campus, PT, DPT, CBIS  06/26/2023, 12:57 PM  [facility] [address] [place], [place], [address] Phone: [phone]   Fax:  [phone]

## 2023-06-26 NOTE — Telephone Encounter (Signed)
 This is to document my attempt to call patient after no-show for OT/PT appt this AM/PM.  This is > 3 no show/missed appts for patient.   Attempted to call patient and unable to leave message at [phone], therefore ended up leaving an message with her sister Bernardine Bridegroom re: missing appt today with voice mail with clinic number [phone] to confirm appt.   While OTR was writing this note, front office personnel reported that mother called back and informed us  that they do not have transportation and future appts were cancelled at this time.

## 2023-06-26 NOTE — Therapy (Signed)
 [facility] [address] [place], [place], [address] Phone: [phone]   Fax:  [phone]  Patient Details  Name: KURSTEN KRUK MRN: [id number] Date of Birth: Feb 08, 2001 Referring Provider:  No ref. provider found  Encounter Date: 06/26/2023 PHYSICAL THERAPY DISCHARGE SUMMARY  Visits from Start of Care: 4  Current functional level related to goals / functional outcomes: Requires ~CGA/MinA for all functional mobility and 24/7 supervision, at least, due to speech and cognitive deficits   Remaining deficits: See eval   Education / Equipment: PT POC, use knee cage + AFO/air cast AAT with ambulation, need assistance with ambulation AAT, HEP, obtaining an AFO   Patient agrees to discharge. Patient goals were not met. Patient is being discharged due to mom calling and reporting that patient did not have transportation.  Rebecca Campus, PT Rebecca Campus, PT, DPT, CBIS  06/26/2023, 1:04 PM  [facility] [address] [place], [place], [address] Phone: [phone]   Fax:  [phone]

## 2023-06-26 NOTE — Therapy (Signed)
 [facility] [address] [place], [place], [address] Phone: [phone]   Fax:  [phone]  Patient Details  Name: Jacqueline [name] MRN: [id number] Date of Birth: 2000-11-24  Encounter Date: 06/26/2023  OCCUPATIONAL THERAPY DISCHARGE SUMMARY  Visits from Start of Care: 2 -- Eval + 1 treatment  Current functional level related to goals / functional outcomes: Pt has not met any goals but does not have any transportation at this time per mother's contact with our office s/p no-show to appt today.  Pt has had >3 no-show appts for OT/PT and ST (eval).  Remaining deficits: Pt has multiple significant functional deficits due to hemiplegia and is recommended to continue/progress HEP and resume therapy when possible.  Pt previously declined for home health due to living situation but this may be an option if things have changed.  Education / Equipment: Pt provided initial materials and education but it is questionable how she and caregiver can/will continue with self-management. See tx notes for more details.   Patient's mother agrees to discharge due to lack of transportation to outpatient occupational therapy at this time.    Zora Hires, OT 06/26/2023, 12:50 PM  [facility] [address] [place], [place], [address] Phone: [phone]   Fax:  [phone]

## 2023-06-27 ENCOUNTER — Encounter: Attending: Registered Nurse | Admitting: Physical Medicine and Rehabilitation

## 2023-06-27 DIAGNOSIS — I63512 Cerebral infarction due to unspecified occlusion or stenosis of left middle cerebral artery: Secondary | ICD-10-CM | POA: Insufficient documentation

## 2023-06-27 NOTE — Progress Notes (Addendum)
 Subjective:    Patient ID: Jacqueline [name], female    DOB: [birth year], 23 y.o.   MRN: [id number]  HPI:  An audio/video tele-health visit is felt to be the most appropriate encounter for this patient at this time. This is a follow up tele-visit via phone. The patient is at home. MD is at office. Prior to scheduling this appointment, our staff discussed the limitations of evaluation and management by telemedicine and the availability of in-person appointments. The patient expressed understanding and agreed to proceed.   Jacqueline [name] is a 23 y.o. female who is here if HFU appointment for F/U of her  Left Middle Cerebral artery stroke , Expressive aphasia and Thrombocytopenia. She was brought to [facility] via EMS on 02/21/2024, with expressive aphasia.   Dr Veleta Gerold H&P: 02/21/2024 Jacqueline [name] is a 23 y.o. 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  Patient's brother called EMS after talking to the patient on the phone and had concerns due to the changes in her speech.   Patient admitted to drinking a significant amount of alcohol last night and she did take Suboxone that is not prescribed to her. On arrival on exam at bridge, patient had expressive and receptive aphasia with word finding difficulty.  She was able to express herself but did have significant delay and had difficulty following commands and repeating words.  She had no vision deficit, focal weakness, sensory deficit or evidence of neglect.  CT Head: WO Contrast:  IMPRESSION: 1. Mild loss of gray-Hsiung differentiation left parietotemporal region may represent left MCA territory acute infarct or artifact given streak artifact through this region. Recommend MRI to further evaluate. 2. No acute hemorrhage.  CTA:  MPRESSION: 1. Occluded left  proximal to mid M2 MCA. 2. Approximately 21 mL of reported mismatch/penumbra in the left parietotemporal region, which correlates with findings on same day CT head and the above occluded vessel. 3. Given the above findings, the area of possible infarct on CT head is felt to be real. Therefore, the area of mismatch/penumbra may be overestimated. An MRI could better characterize the extent of acute infarct if clinically warranted.  MR: Brain WO Contrast:  IMPRESSION: 1. Fairly extensive acute left MCA territory and watershed territory infarcts within the left cerebral hemisphere as described. Prominent susceptibility-weighted signal loss along the course of an M2 left middle cerebral artery vessel, which may reflect slow flow within this vessel or vessel occlusion. 2. Small acute right frontoparietal cortical/subcortical infarcts also present. 3. Abnormal T1 hypointense marrow signal within the calvarium and within visualized portions of the cervical spine. While this finding can reflect a marrow infiltrative process, the most common causes include chronic anemia, smoking and obesity.  Per Dr Janett Medin Note:  She went to IR for mechanical thrombectomy, but this was unfortunately unsuccessful.  Stroke:  left MCA territory infarct s/p unsuccessful IR with extension, etiology: likely related to hypercoagulability from positive lupus anticoagulant not on [facility] Code Stroke CT head left parietotemporal \\infarct  ASPECTS 7-8 CTA head & neck occluded left proximal to mid M2 MCA CT perfusion 21 mm penumbra in left parietotemporal region S/p IR - initially TICI3, but progressive worsening stenosis with subsequent complete closure of left M2. Intermittent mild improvement in cailber with verapamil  and nitroglycerine.  MRI acute left MCA territory infarct without hemorrhagic conversion  CT repeat likely stroke extension in the anterior inferior right frontal lobe and right cerebellum MRI repeat showed  stroke extension but no hemorrhagic conversion. MLS mild < 3mm.  CT repeat 12/28 showed stable large right MCA and small left MCA/ACA infarcts. MLS 2-15mm CT repeat 12/30 expected evolutionary changes in the left posterior division left MCA and left ACA/MCA border zone infarcts.  2D Echo EF 60 to 65%.  Left atrium size normal. EEG severe encephalopathy, no seizure LDL 42 HgbA1c 4.4 UDS neg VTE prophylaxis -SCDs No antithrombotic prior to admission, now on heparin  IV.  Therapy recommendations:  CIR  Jacqueline [name] was admitted to inpatient Rehabilitation on 03/25/2023 and discharged to her sisters home on 04/23/2023. She has a scheduled appointment with Neuro Outpatient Therapy today.   Jacqueline [name] denies any pain . She rates her pain 0. She has expressive aphasia, sister states her appetite is fair.   Jacqueline [name] arrived to office in wheelchair .   Jacqueline [name] was admitted to [facility] on 05/06/2023 and discharged on 05/15/2023, she was admitted with Throbocytopenia, discharged summary was reviewed.   Sister and family in room.   I received a message from therapy stating that patient was unable to attend therapy as they no longer have a car. Mother is interested in home therapy for her  Pain Inventory Average Pain 0 Pain Right Now 0 My pain is  No pain   LOCATION OF PAIN  No pain  BOWEL Number of stools per week: 14 Oral laxative use No  Type of laxative none Enema or suppository use No  History of colostomy No  Incontinent No   BLADDER Normal    Mobility walk with assistance use a cane ability to climb steps?  no do you drive?  no use a wheelchair needs help with transfers Do you have any goals in this area?  yes  Function disabled: date disabled Has paperwork to apply.  I need assistance with the following:  dressing, bathing, toileting, meal prep, household duties, and shopping Do you have any goals in this area?  yes  Neuro/Psych trouble walking  Prior  Studies Any changes since last visit?  no  Physicians involved in your care Any changes since last visit?  no   Family History  Problem Relation Age of Onset   Hypertension Maternal Grandmother    Diabetes Maternal Grandmother    Cancer Neg Hx    Social History   Socioeconomic History   Marital status: Single    Spouse name: Not on file   Number of children: Not on file   Years of education: Not on file   Highest education level: Not on file  Occupational History   Not on file  Tobacco Use   Smoking status: Former    Types: Cigars   Smokeless tobacco: Never   Tobacco comments:    1 B&M per day  Vaping Use   Vaping status: Never Used  Substance and Sexual Activity   Alcohol use: Not Currently   Drug use: No   Sexual activity: Yes    Partners: Male    Birth control/protection: None  Other Topics Concern   Not on file  Social History Narrative   [phone]- patient's confidential cell   Social Drivers of Health   Financial Resource Strain: Not on File (06/21/2021)   Received from [employer] [employer]    Financial Resource Strain: 0  Food Insecurity: No Food Insecurity (05/07/2023)   Hunger  Vital Sign    Worried About Programme researcher, broadcasting/film/video in the Last Year: Never true    Ran Out of Food in the Last Year: Never true  Transportation Needs: No Transportation Needs (05/07/2023)   PRAPARE - Administrator, Civil Service (Medical): No    Lack of Transportation (Non-Medical): No  Physical Activity: Not on File (06/21/2021)   Received from [place], [place]   Physical Activity    Physical Activity: 0  Stress: Not on File (06/21/2021)   Received from [facility], [place]   Stress    Stress: 0  Social Connections: Not on File (11/16/2022)   Received from [facility]   Social Connections    Connectedness: 0   Past Surgical History:  Procedure Laterality Date   CESAREAN SECTION N/A 09/04/2016   Procedure: CESAREAN SECTION;  Surgeon: Malka Sea, DO;   Location: [facility] BIRTHING SUIT[facility];  Service: Obstetrics;  Laterality: N/A;   CESAREAN SECTION Bilateral    IR CT HEAD LTD  02/21/2023   IR GASTROSTOMY TUBE MOD SED  03/21/2023   IR GASTROSTOMY TUBE REMOVAL  05/14/2023   IR PERCUTANEOUS ART THROMBECTOMY/INFUSION INTRACRANIAL INC DIAG ANGIO  02/21/2023   IR REPLC GASTRO/COLONIC TUBE PERCUT W/FLUORO  03/27/2023   RADIOLOGY WITH ANESTHESIA N/A 02/21/2023   Procedure: IR WITH ANESTHESIA;  Surgeon: Luellen Sages, MD;  Location: [facility] OR;  Service: Radiology;  Laterality: N/A;   Past Medical History:  Diagnosis Date   Bacterial vaginitis 07/10/2016   Candida vaginitis 07/24/2016   Headache in pregnancy, antepartum, third trimester 07/24/2016   Kell isoimmunization during pregnancy    There were no vitals taken for this visit.  Opioid Risk Score:   Fall Risk Score:  `1  Depression screen PHQ 2/9      No data to display          Review of Systems  Musculoskeletal:  Positive for gait problem.  Neurological:  Positive for speech difficulty.  All other systems reviewed and are negative.      Objective:   Prior exam: itals and nursing note reviewed.  Constitutional:      Appearance: Normal appearance.  Cardiovascular:     Rate and Rhythm: Normal rate and regular rhythm.     Pulses: Normal pulses.     Heart sounds: Normal heart sounds.  Pulmonary:     Effort: Pulmonary effort is normal.     Breath sounds: Normal breath sounds.  Musculoskeletal:     Comments: Normal Muscle Bulk and Muscle Testing Reveals: Upper Extremities: Right: Paralysis Wearing Right hand splint  Left Upper Extremity: Full ROM and Muscle Strength 5/5  Lower Extremities: Right: Decreased ROM and Muscle Strength 4/5 Left Lower Extremity: Full ROM and Muscle Strength 5/5 Arrived in wheelchair     Skin:    General: Skin is warm and dry.  Neurological:     Mental Status: She is alert and oriented to person, place, and time.  Psychiatric:        Mood and Affect:  Mood normal.        Behavior: Behavior normal.         Assessment & Plan:  Left Middle Cerebral artery stroke , Expressive aphasia : She has a scheduled appointment with Neuro- Rehabilitation. and Thrombocytopenia. She has a scheduled appointment with Neurology. Continue current medication regimen. Continue to Monitor.  -discussed that she is no longer able to attend therapy due to loss of her car, discussed that I can order home therapy for  her as well as SW consult  Thrombocytopenia: She with F/U with Hematology. Continue to Monitor.   5 minutes spent in discussion that she is no longer able to attend therapy due to loss of her car, discussed that I can order home therapy for her as well as SW consult

## 2023-06-27 NOTE — Addendum Note (Signed)
 Addended by: Liam Redhead on: 06/27/2023 04:02 PM   Modules accepted: Orders, Level of Service

## 2023-06-30 ENCOUNTER — Telehealth: Payer: Self-pay | Admitting: Physical Medicine and Rehabilitation

## 2023-06-30 ENCOUNTER — Encounter: Admitting: Physical Medicine and Rehabilitation

## 2023-06-30 NOTE — Telephone Encounter (Signed)
 Cecelia- aderation called w verbal orders for the patient 2 w 2 1 w 7 And also OT and speech therapy to evaluate

## 2023-07-01 ENCOUNTER — Ambulatory Visit: Admitting: Speech Pathology

## 2023-07-01 ENCOUNTER — Ambulatory Visit: Admitting: Occupational Therapy

## 2023-07-01 ENCOUNTER — Ambulatory Visit: Admitting: Physical Therapy

## 2023-07-02 ENCOUNTER — Inpatient Hospital Stay (HOSPITAL_BASED_OUTPATIENT_CLINIC_OR_DEPARTMENT_OTHER): Admitting: Hematology

## 2023-07-02 ENCOUNTER — Inpatient Hospital Stay

## 2023-07-02 ENCOUNTER — Other Ambulatory Visit: Payer: Self-pay

## 2023-07-02 VITALS — BP 105/70 | HR 101 | Temp 97.8°F | Resp 16

## 2023-07-02 VITALS — BP 108/80 | HR 101 | Temp 98.5°F | Resp 18 | Ht 64.0 in | Wt 179.0 lb

## 2023-07-02 DIAGNOSIS — N920 Excessive and frequent menstruation with regular cycle: Secondary | ICD-10-CM | POA: Diagnosis not present

## 2023-07-02 DIAGNOSIS — D693 Immune thrombocytopenic purpura: Secondary | ICD-10-CM

## 2023-07-02 DIAGNOSIS — I69351 Hemiplegia and hemiparesis following cerebral infarction affecting right dominant side: Secondary | ICD-10-CM | POA: Diagnosis not present

## 2023-07-02 DIAGNOSIS — I69320 Aphasia following cerebral infarction: Secondary | ICD-10-CM | POA: Diagnosis not present

## 2023-07-02 DIAGNOSIS — D696 Thrombocytopenia, unspecified: Secondary | ICD-10-CM | POA: Diagnosis not present

## 2023-07-02 DIAGNOSIS — Z803 Family history of malignant neoplasm of breast: Secondary | ICD-10-CM | POA: Diagnosis not present

## 2023-07-02 DIAGNOSIS — Z86711 Personal history of pulmonary embolism: Secondary | ICD-10-CM

## 2023-07-02 DIAGNOSIS — D6861 Antiphospholipid syndrome: Secondary | ICD-10-CM

## 2023-07-02 DIAGNOSIS — Z7901 Long term (current) use of anticoagulants: Secondary | ICD-10-CM | POA: Diagnosis not present

## 2023-07-02 DIAGNOSIS — R059 Cough, unspecified: Secondary | ICD-10-CM | POA: Diagnosis not present

## 2023-07-02 DIAGNOSIS — Z8249 Family history of ischemic heart disease and other diseases of the circulatory system: Secondary | ICD-10-CM | POA: Diagnosis not present

## 2023-07-02 DIAGNOSIS — Z833 Family history of diabetes mellitus: Secondary | ICD-10-CM | POA: Diagnosis not present

## 2023-07-02 DIAGNOSIS — Z79899 Other long term (current) drug therapy: Secondary | ICD-10-CM | POA: Diagnosis not present

## 2023-07-02 DIAGNOSIS — Z7952 Long term (current) use of systemic steroids: Secondary | ICD-10-CM | POA: Diagnosis not present

## 2023-07-02 DIAGNOSIS — I2699 Other pulmonary embolism without acute cor pulmonale: Secondary | ICD-10-CM | POA: Diagnosis present

## 2023-07-02 DIAGNOSIS — Z87891 Personal history of nicotine dependence: Secondary | ICD-10-CM | POA: Diagnosis not present

## 2023-07-02 DIAGNOSIS — I63512 Cerebral infarction due to unspecified occlusion or stenosis of left middle cerebral artery: Secondary | ICD-10-CM

## 2023-07-02 DIAGNOSIS — R479 Unspecified speech disturbances: Secondary | ICD-10-CM | POA: Diagnosis not present

## 2023-07-02 DIAGNOSIS — D500 Iron deficiency anemia secondary to blood loss (chronic): Secondary | ICD-10-CM | POA: Diagnosis not present

## 2023-07-02 LAB — CBC WITH DIFFERENTIAL (CANCER CENTER ONLY)
Abs Immature Granulocytes: 0.03 10*3/uL (ref 0.00–0.07)
Basophils Absolute: 0 10*3/uL (ref 0.0–0.1)
Basophils Relative: 0 %
Eosinophils Absolute: 0 10*3/uL (ref 0.0–0.5)
Eosinophils Relative: 0 %
HCT: 37.1 % (ref 36.0–46.0)
Hemoglobin: 13.2 g/dL (ref 12.0–15.0)
Immature Granulocytes: 0 %
Lymphocytes Relative: 30 %
Lymphs Abs: 3.8 10*3/uL (ref 0.7–4.0)
MCH: 28.2 pg (ref 26.0–34.0)
MCHC: 35.6 g/dL (ref 30.0–36.0)
MCV: 79.3 fL — ABNORMAL LOW (ref 80.0–100.0)
Monocytes Absolute: 1 10*3/uL (ref 0.1–1.0)
Monocytes Relative: 8 %
Neutro Abs: 7.8 10*3/uL — ABNORMAL HIGH (ref 1.7–7.7)
Neutrophils Relative %: 62 %
Platelet Count: 110 10*3/uL — ABNORMAL LOW (ref 150–400)
RBC: 4.68 MIL/uL (ref 3.87–5.11)
RDW: 16.5 % — ABNORMAL HIGH (ref 11.5–15.5)
Smear Review: NORMAL
WBC Count: 12.6 10*3/uL — ABNORMAL HIGH (ref 4.0–10.5)
nRBC: 0 % (ref 0.0–0.2)

## 2023-07-02 LAB — CMP (CANCER CENTER ONLY)
ALT: 15 U/L (ref 0–44)
AST: 14 U/L — ABNORMAL LOW (ref 15–41)
Albumin: 3.9 g/dL (ref 3.5–5.0)
Alkaline Phosphatase: 61 U/L (ref 38–126)
Anion gap: 8 (ref 5–15)
BUN: 21 mg/dL — ABNORMAL HIGH (ref 6–20)
CO2: 22 mmol/L (ref 22–32)
Calcium: 8.9 mg/dL (ref 8.9–10.3)
Chloride: 107 mmol/L (ref 98–111)
Creatinine: 1 mg/dL (ref 0.44–1.00)
GFR, Estimated: >60 mL/min (ref >60)
Glucose, Bld: 96 mg/dL (ref 70–99)
Potassium: 3.2 mmol/L — ABNORMAL LOW (ref 3.5–5.1)
Sodium: 137 mmol/L (ref 135–145)
Total Bilirubin: 0.2 mg/dL (ref 0.0–1.2)
Total Protein: 6.9 g/dL (ref 6.5–8.1)

## 2023-07-02 LAB — PREGNANCY, URINE: Preg Test, Ur: NEGATIVE

## 2023-07-02 LAB — HEPATITIS B SURFACE ANTIGEN: Hepatitis B Surface Ag: NONREACTIVE

## 2023-07-02 MED ORDER — ACETAMINOPHEN 325 MG PO TABS
650.0000 mg | ORAL_TABLET | Freq: Once | ORAL | Status: AC
  Administered 2023-07-02: 650 mg via ORAL
  Filled 2023-07-02: qty 2

## 2023-07-02 MED ORDER — DIPHENHYDRAMINE HCL 25 MG PO CAPS
50.0000 mg | ORAL_CAPSULE | Freq: Once | ORAL | Status: AC
  Administered 2023-07-02: 50 mg via ORAL
  Filled 2023-07-02: qty 2

## 2023-07-02 MED ORDER — SODIUM CHLORIDE 0.9 % IV SOLN
375.0000 mg/m2 | Freq: Once | INTRAVENOUS | Status: AC
  Administered 2023-07-02: 700 mg via INTRAVENOUS
  Filled 2023-07-02: qty 50

## 2023-07-02 MED ORDER — SODIUM CHLORIDE 0.9 % IV SOLN
INTRAVENOUS | Status: DC
Start: 2023-07-02 — End: 2023-07-02

## 2023-07-02 MED ORDER — METHYLPREDNISOLONE SODIUM SUCC 125 MG IJ SOLR
125.0000 mg | Freq: Every day | INTRAMUSCULAR | Status: DC
  Administered 2023-07-02: 125 mg via INTRAVENOUS
  Filled 2023-07-02: qty 2

## 2023-07-02 MED ORDER — FAMOTIDINE IN NACL 20-0.9 MG/50ML-% IV SOLN
20.0000 mg | Freq: Once | INTRAVENOUS | Status: AC
  Administered 2023-07-02: 20 mg via INTRAVENOUS
  Filled 2023-07-02: qty 50

## 2023-07-02 MED ORDER — APIXABAN 2.5 MG PO TABS: 2.5000 mg | ORAL_TABLET | Freq: Two times a day (BID) | ORAL | 1 refills | Status: DC

## 2023-07-02 NOTE — Progress Notes (Signed)
 Patient seen by Dr. Minnie Amber are within treatment parameters.  Labs reviewed: and are not all within treatment parameters.    Dr Salomon Cree aware PLTs: 110,   Per physician team, patient is ready for treatment and there are NO modifications to the treatment plan. No need to wait on UPT to start tx per Dr Salomon Cree

## 2023-07-02 NOTE — Progress Notes (Signed)
 HEMATOLOGY/ONCOLOGY CLINIC NOTE  Date of Service: 07/02/23  Patient Care Team: Jacqueline Lorenzo, MD as PCP - General (Pediatrics)  CHIEF COMPLAINTS/PURPOSE OF CONSULTATION:  Mx of thrombocytopenia and antiphospholipid antibody syndrome  HISTORY OF PRESENTING ILLNESS:  Jacqueline [name] is a wonderful 23 y.o. female who has been referred to us  by Jacqueline Lorenzo, MD for evaluation and management of Acute pulmonary embolism.  Patient presented to the ED on 04/19/2022 for right-sided headache.  Today, she reports that since being hospitalized in February, she does experience pain in her right lower lungs. Patient denies any medical issues prior to her hospitalization. Her symptoms progressively worsened over the course of 3 day prior to her hospitalization. Patient had no leg pain or swelling at the time and no obvious clots were  found on US . She did lose about 30 pounds over the course of one month prior to her February hospitalization. Patient's weight has since stabilized. Patient was not on any other supplements or medications prior to being hospitalized.  Patient was deficient iron , folic acid , and B12 in February. She reports that she generally consumed meals once daily due to working frequently. Patient does regularly drink water .  She complains of severe headaches which are persistent and occur during the night. Her headaches have occurred for about a month. She notes that the tension/pain only occurs in her right temple. Patient does experience flashing light symptoms with her headaches. CT venogram 04/19/2022 did not show any abnormal findings.  Patient continues to have a lingering cough since  being infected with pneumonia. She denies any recent viral or COVID-19 infections. Her breathing has slightly improved. Patient continues to have a dry and productive cough with phlegm. She did smoke two cigarettes daily of tobacco-based Black and Mild.  She does generally take Eliquis  on  a regular basis, but has not for the past 3 days due to running out. Patient has since had this refilled.   She did previously have a Nexplanon  implantation, but had it removed in December 2023. She complains of heavy menstrual bleeding with plenty of blood clots. Her menstrual cycles typically last 5 days. Her menstrual cycle was regular after her Nexplanon  implant was removed. Patient does not wish to be on any birth control at this time. She did endorse menstrual cycles while being off of blood thinners.   She reports that her grandmother had breast cancer and passed away at 84. She did not have any genetic mutations. She denies any fhx of autoimmune conditions but does note a fhx of diabetes mellitus. She denies any hx of blood clot in past. She denies any family history of blood clots.  She denies any new lumps/bumps, back pain, abdominal pain, pain over the sinuses, or leg swelling. She denies any joint pain, new rashes, nausea, or dizziness. She denies any long distance travel. Patient has a 60 and 71 year-old child.   INTERVAL HISTORY:  Jacqueline [name] is a 23 y.o. female here for continued evaluation and management of iron  deficiency anemia, Acute pulmonary embolism, and thrombocytopenia.   She was last seen by me on 05/28/2023.  Today, she presents in a wheelchair and is accompanied by her mother.   Patient's mother reports that patient missed her previously scheduled treatments from just under a month ago due to transportation issues. Her mother reports that she does not own a vehicle and generally gets around via use of Uber. Her mother reports that patient does not have any other family members  that are involved in patient's care.   Patient reports that she is off of Prednisone  at this time. Her last day of taking Prednisone  was 4-5 days ago.   Patient denies feeling any differently from her last clinical visit. She denies any bleeding issues such as nose bleeds or gum bleeds. Patient  reports that her periods are not as heavy as previously.   Patient noted to have improved headaches. She denies any new neurological symptoms.   She continues to take 2.5 MG Eliquis  twice daily at this time. No bleeding issues.  Patient has been eating well.   She has been using Clonazepam  0.5 MG for her sleeping habits. Her Clonazepam  is managed by Jacqueline Munroe, NP. She reports needing a refill.   She was last seen by her PCP, Jacqueline Race, MD, on 05/06/2023 with [facility] in [place] [phone]).   MEDICAL HISTORY:  Past Medical History:  Diagnosis Date   Bacterial vaginitis 07/10/2016   Candida vaginitis 07/24/2016   Headache in pregnancy, antepartum, third trimester 07/24/2016   Kell isoimmunization during pregnancy     SURGICAL HISTORY: Past Surgical History:  Procedure Laterality Date   CESAREAN SECTION N/A 09/04/2016   Procedure: CESAREAN SECTION;  Surgeon: Malka Sea, DO;  Location: [facility];  Service: Obstetrics;  Laterality: N/A;   CESAREAN SECTION Bilateral    IR CT HEAD LTD  02/21/2023   IR GASTROSTOMY TUBE MOD SED  03/21/2023   IR GASTROSTOMY TUBE REMOVAL  05/14/2023   IR PERCUTANEOUS ART THROMBECTOMY/INFUSION INTRACRANIAL INC DIAG ANGIO  02/21/2023   IR REPLC GASTRO/COLONIC TUBE PERCUT W/FLUORO  03/27/2023   RADIOLOGY WITH ANESTHESIA N/A 02/21/2023   Procedure: IR WITH ANESTHESIA;  Surgeon: Luellen Sages, MD;  Location: [facility];  Service: Radiology;  Laterality: N/A;    SOCIAL HISTORY: Social History   Socioeconomic History   Marital status: Single    Spouse name: Not on file   Number of children: Not on file   Years of education: Not on file   Highest education level: Not on file  Occupational History   Not on file  Tobacco Use   Smoking status: Former    Types: Cigars   Smokeless tobacco: Never   Tobacco comments:    1 B&M per day  Vaping Use   Vaping status: Never Used  Substance and Sexual Activity   Alcohol use: Not  Currently   Drug use: No   Sexual activity: Yes    Partners: Male    Birth control/protection: None  Other Topics Concern   Not on file  Social History Narrative   [phone]- patient's confidential cell   Social Drivers of Health   Financial Resource Strain: Not on File (06/21/2021)   Received from [employer], [employer]    Financial Resource Strain: 0  Food Insecurity: No Food Insecurity (05/07/2023)   Hunger Vital Sign    Worried About Running Out of Food in the Last Year: Never true    Ran Out of Food in the Last Year: Never true  Transportation Needs: No Transportation Needs (05/07/2023)   PRAPARE - Administrator, Civil Service (Medical): No    Lack of Transportation (Non-Medical): No  Physical Activity: Not on File (06/21/2021)   Received from [place], [place]   Physical Activity    Physical Activity: 0  Stress: Not on File (06/21/2021)   Received from [facility]   Stress    Stress: 0  Social  Connections: Not on File (11/16/2022)   Received from [facility]   Social Connections    Connectedness: 0  Intimate Partner Violence: Patient Declined (05/07/2023)   Humiliation, Afraid, Rape, and Kick questionnaire    Fear of Current or Ex-Partner: Patient declined    Emotionally Abused: Patient declined    Physically Abused: Patient declined    Sexually Abused: Patient declined    FAMILY HISTORY: Family History  Problem Relation Age of Onset   Hypertension Maternal Grandmother    Diabetes Maternal Grandmother    Cancer Neg Hx     ALLERGIES:  has no known allergies.  MEDICATIONS:  Current Outpatient Medications  Medication Sig Dispense Refill   acetaminophen  (TYLENOL ) 325 MG tablet Take 2 tablets (650 mg total) by mouth every 4 (four) hours as needed for mild pain (pain score 1-3) (or temp > 37.5 C (99.5 F)).     apixaban  (ELIQUIS ) 2.5 MG TABS tablet Take 1 tablet (2.5 mg total) by mouth 2 (two) times daily. 60 tablet 1   clonazePAM  (KLONOPIN ) 0.5  MG tablet Take 1 tablet (0.5 mg total) by mouth at bedtime. 30 tablet 0   cyanocobalamin  1000 MCG tablet Take 2 tablets (2,000 mcg total) by mouth daily. 60 tablet 0   docusate sodium  (COLACE) 100 MG capsule Take 1 capsule (100 mg total) by mouth 3 (three) times daily. (Patient not taking: Reported on 05/22/2023)     FLUoxetine  (PROZAC ) 10 MG capsule Take 1 capsule by mouth daily.     folic acid  (FOLVITE ) 1 MG tablet Take 1 tablet (1 mg total) by mouth daily. 30 tablet 0   gabapentin  (NEURONTIN ) 100 MG capsule Take 1 capsule (100 mg total) by mouth 3 (three) times daily. 90 capsule 1   iron  polysaccharides (NIFEREX) 150 MG capsule Take 1 capsule (150 mg total) by mouth daily. 30 capsule 0   lidocaine -prilocaine  (EMLA ) cream Apply to affected area once 30 g 3   Multiple Vitamin (MULTIVITAMIN WITH MINERALS) TABS tablet Take 1 tablet by mouth daily.     naloxone  (NARCAN ) nasal spray 4 mg/0.1 mL Place 1 spray into the nose as needed (opioid overdose).     oxyCODONE  (OXY IR/ROXICODONE ) 5 MG immediate release tablet Take 1 tablet (5 mg total) by mouth every 4 (four) hours as needed (perceived pain). 30 tablet 0   polyethylene glycol (MIRALAX  / GLYCOLAX ) 17 g packet Take 17 g by mouth 2 (two) times daily. (Patient not taking: Reported on 05/22/2023)     predniSONE  (DELTASONE ) 20 MG tablet Take 2 tablets (40 mg total) by mouth daily with breakfast for 14 days, THEN 1 tablet (20 mg total) daily with breakfast for 14 days, THEN 0.5 tablets (10 mg total) daily with breakfast for 14 days, THEN 0.5 tablets (10 mg total) every other day for 14 days. 52.5 tablet 0   No current facility-administered medications for this visit.    REVIEW OF SYSTEMS:    10 Point review of Systems was done is negative except as noted above.   PHYSICAL EXAMINATION: ECOG PERFORMANCE STATUS: 1 - Symptomatic but completely ambulatory  . Vitals:   07/02/23 0937  BP: 108/80  Pulse: (!) 101  Resp: 18  Temp: 98.5 F (36.9 C)   SpO2: 100%   Filed Weights   07/02/23 0937  Weight: 179 lb (81.2 kg)   .Body mass index is 30.73 kg/m.  GENERAL:alert, in no acute distress and comfortable SKIN: no acute rashes, no significant lesions EYES: conjunctiva are pink and non-injected,  sclera anicteric OROPHARYNX: MMM, no exudates, no oropharyngeal erythema or ulceration NECK: supple, no JVD LYMPH:  no palpable lymphadenopathy in the cervical, axillary or inguinal regions LUNGS: clear to auscultation b/l with normal respiratory effort HEART: regular rate & rhythm ABDOMEN:  normoactive bowel sounds , non tender, not distended. Extremity: no pedal edema   LABORATORY DATA:  I have reviewed the data as listed .    Latest Ref Rng & Units 07/02/2023    9:15 AM 05/28/2023    3:05 PM 05/15/2023    3:20 AM  CBC  WBC 4.0 - 10.5 K/uL 12.6  10.5  6.2   Hemoglobin 12.0 - 15.0 g/dL 40.9  81.1  8.7   Hematocrit 36.0 - 46.0 % 37.1  31.7  26.9   Platelets 150 - 400 K/uL 110  79  55    .    Latest Ref Rng & Units 07/02/2023    9:15 AM 05/28/2023    3:05 PM 05/13/2023   10:46 AM  CMP  Glucose 70 - 99 mg/dL 96  85  914   BUN 6 - 20 mg/dL 21  11  20    Creatinine 0.44 - 1.00 mg/dL 7.82  9.56  2.13   Sodium 135 - 145 mmol/L 137  137  134   Potassium 3.5 - 5.1 mmol/L 3.2  4.1  3.8   Chloride 98 - 111 mmol/L 107  108  106   CO2 22 - 32 mmol/L 22  25  19    Calcium 8.9 - 10.3 mg/dL 8.9  9.1  9.2   Total Protein 6.5 - 8.1 g/dL 6.9  7.7  8.3   Total Bilirubin 0.0 - 1.2 mg/dL 0.2  0.3  0.5   Alkaline Phos 38 - 126 U/L 61  50  57   AST 15 - 41 U/L 14  23  16    ALT 0 - 44 U/L 15  15  9     . Lab Results  Component Value Date   IRON  71 05/28/2023   TIBC 262 05/28/2023   IRONPCTSAT 27 05/28/2023   (Iron  and TIBC)  Lab Results  Component Value Date   FERRITIN 196 05/28/2023     RADIOGRAPHIC STUDIES: I have personally reviewed the radiological images as listed and agreed with the findings in the report. No results  found.   ASSESSMENT & PLAN:   23 y.o. female with:  Acute on chronic thrombocytopenia - likely multifactorial.  Some component of drop due to consumption from significant menstrual bleeding. 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 No overt associated autoimmune condition at this time associated with the antiphospholipid antibody syndrome at this time.  -  Platelets continuing to improve 79k today.  - Status post IVIG x2 doses, completed 05/13/23 and on Prednisone  taper. -S/p one unit platelets on 3/9.   2. Anemia IDA History of folate deficiency History of Menorrhagia - IDA due to menorrhagia which she has had prior to anticoagulation and now worse with Anticoagulation and thrombocytopenia. Currently periods are over.  - Status post prbc transfusions this  - Follow up with Ob/Gyn to help control periods. Was previously on Megace  which might increase risk of VTE. Also would not recommend systemic hormonal birth control pills due to high risk of VTE in the context of previous h/o PE and antiphospholipid antibody syndrome.    3. Positive lupus anticoagulant History of unprovoked PE - Lupus anticoagulant  was identified once in 2024. - Lupus anticoagulant persistently strongly positive and confirmed with Hexagonal phospholipid neutralization assay.  - remains at high risk of venous and arterial thrombosis as well as bleeding if she were put on therapeutic anticoagulation at this time  - Status post Eliquis  which was stopped in June 2024.  However was restarted due to recent stroke.  Last dose 05/05/2023.   - Restarted Eliquis  2.5 mg po bid as platelets now over 50k. HOLD if platelets noted to be below 50k. - Recommend increase to full dose of 5mg  po BID if platelets continue to improve and remain stable.  - Initiated IVIG 1g/kg q24h x 2 doses with  tylenol , loratadine  and solumedrol as premedications and is on prednisone  taper.   4. 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     - Noted difficulty with speech and word finding - Continue supportive care   PLAN: -Discuss again with patient and her mother importance of maintaining compliance with her treatments and followups..since she has missed multiple appointments. -Discussed that her APLA proteins are dropping her platelets and there are concerns of bleeding risks if her platelets are too low as well as clotting if she is not treated. Discussed that there is a need for close attention.  -educated patient that we are treating her for a complex situation in which she has abnormal proteins in the blood causing abnormal blood clots -discussed goal to knock out her abnormal antibodies to improve her platelets and reduce the risk of blood clots  -discussed options to either proceed with treatment to try to avoid a complication or to focus on keeping the patient comfortable though there could be complications -discussed that whether to consider treatment is a personal and involved choice with frequent follow-up visits and monitoring. Discussed that not receiving treatment would eventually cause a complication.  -patient is inclined to proceed with treatment.  -Discussed that delaying treatment would not be in her best interest  -Will start Rituxan  antibody treatment to reduce the amount of abnormal protein causing her platelets to drop and causing increased risk of blood clots due to Antiphospholipid antibody syndrome --CBC resulted after clinical visit. WBCs 12.6, hgb 13.2, platelets 110K.  -platelets improved from 55K 1.5 months ago, to 79K 1 month ago, to 110K currently with IVIG and steroids. -continue Eliquis  2.5mg  po BID and if platelets remain stable will increase to 5mg  po BID -connect with PCP  to consider refilling Clonazepam   -discussed that there is a need for neurology appointment to address neurologic symptoms like headache -continue to follow up with OB/GYN  FOLLOW-UP: Weekly Rituxan  per integrated scheduling Lupron every 12 weeks  MD visit with C2 of Rituxan  for toxicity check  The total time spent in the appointment was 40 minutes* .  All of the patient's questions were answered with apparent satisfaction. The patient knows to call the clinic with any problems, questions or concerns.   Jacquelyn Matt MD MS AAHIVMS [facility] Hematology/Oncology Physician [facility]  .*Total Encounter Time as defined by the Centers for Medicare and Medicaid Services includes, in addition to the face-to-face time of a patient visit (documented in the note above) non-face-to-face time: obtaining and reviewing outside history, ordering and reviewing medications, tests or procedures, care coordination (communications with other health care professionals or caregivers) and documentation in the medical record.    I,Mitra Faeizi,acting as a Neurosurgeon for Jacquelyn Matt, MD.,have documented  all relevant documentation on the behalf of Jacquelyn Matt, MD,as directed by  Jacquelyn Matt, MD while in the presence of Jacquelyn Matt, MD.  .I have reviewed the above documentation for accuracy and completeness, and I agree with the above. .Joseth Weigel Kishore Janijah Symons MD

## 2023-07-02 NOTE — Patient Instructions (Signed)
 [facility] - A DEPT OF [facility]  Discharge Instructions: Thank you for choosing [facility] to provide your oncology and hematology care.   If you have a lab appointment with the Cancer Center, please go directly to the Cancer Center and check in at the registration area.   Wear comfortable clothing and clothing appropriate for easy access to any Portacath or PICC line.   We strive to give you quality time with your provider. You may need to reschedule your appointment if you arrive late (15 or more minutes).  Arriving late affects you and other patients whose appointments are after yours.  Also, if you miss three or more appointments without notifying the office, you may be dismissed from the clinic at the provider's discretion.      For prescription refill requests, have your pharmacy contact our office and allow 72 hours for refills to be completed.    Today you received the following chemotherapy and/or immunotherapy agents rituxan      To help prevent nausea and vomiting after your treatment, we encourage you to take your nausea medication as directed.  BELOW ARE SYMPTOMS THAT SHOULD BE REPORTED IMMEDIATELY: *FEVER GREATER THAN 100.4 F (38 C) OR HIGHER *CHILLS OR SWEATING *NAUSEA AND VOMITING THAT IS NOT CONTROLLED WITH YOUR NAUSEA MEDICATION *UNUSUAL SHORTNESS OF BREATH *UNUSUAL BRUISING OR BLEEDING *URINARY PROBLEMS (pain or burning when urinating, or frequent urination) *BOWEL PROBLEMS (unusual diarrhea, constipation, pain near the anus) TENDERNESS IN MOUTH AND THROAT WITH OR WITHOUT PRESENCE OF ULCERS (sore throat, sores in mouth, or a toothache) UNUSUAL RASH, SWELLING OR PAIN  UNUSUAL VAGINAL DISCHARGE OR ITCHING   Items with * indicate a potential emergency and should be followed up as soon as possible or go to the Emergency Department if any problems should occur.  Please show the CHEMOTHERAPY ALERT CARD or IMMUNOTHERAPY  ALERT CARD at check-in to the Emergency Department and triage nurse.  Should you have questions after your visit or need to cancel or reschedule your appointment, please contact [facility] - A DEPT OF [facility]  Dept: [phone]  and follow the prompts.  Office hours are 8:00 a.m. to 4:30 p.m. Monday - Friday. Please note that voicemails left after 4:00 p.m. may not be returned until the following business day.  We are closed weekends and major holidays. You have access to a nurse at all times for urgent questions. Please call the main number to the clinic Dept: [phone] and follow the prompts.   For any non-urgent questions, you may also contact your provider using MyChart. We now offer e-Visits for anyone 2 and older to request care online for non-urgent symptoms. For details visit [email or web address].   Also download the MyChart app! Go to the app store, search "MyChart", open the app, select [place], and log in with your MyChart username and password.

## 2023-07-03 ENCOUNTER — Ambulatory Visit

## 2023-07-03 ENCOUNTER — Inpatient Hospital Stay: Payer: Self-pay | Admitting: Diagnostic Neuroimaging

## 2023-07-03 ENCOUNTER — Telehealth: Payer: Self-pay

## 2023-07-03 ENCOUNTER — Encounter: Admitting: Occupational Therapy

## 2023-07-03 ENCOUNTER — Telehealth: Payer: Self-pay | Admitting: Diagnostic Neuroimaging

## 2023-07-03 LAB — HEPATITIS B CORE ANTIBODY, TOTAL: HEP B CORE AB: NEGATIVE

## 2023-07-03 NOTE — Patient Outreach (Signed)
 Telephone outreach to patient to obtain mRS was unsuccessfully. Patient refused. MRS= 7  Jacqueline [name] [facility] Health Care Management Assistant  Direct Dial: [phone]  Fax: [phone] Website: [email or web address]

## 2023-07-03 NOTE — Telephone Encounter (Signed)
 [name], Brekken Fang call to verify appointment and if have time to complete PT appointment before 3:30 pm appointment. Informed her his appointment is at 3:30 pm id he comes after 3:30 pm will need to reschedule. She verbalized understand.

## 2023-07-08 ENCOUNTER — Telehealth: Payer: Self-pay | Admitting: Hematology

## 2023-07-08 ENCOUNTER — Encounter: Admitting: Speech Pathology

## 2023-07-08 ENCOUNTER — Ambulatory Visit: Admitting: Physical Therapy

## 2023-07-08 ENCOUNTER — Encounter: Payer: Self-pay | Admitting: Hematology

## 2023-07-08 ENCOUNTER — Encounter: Admitting: Occupational Therapy

## 2023-07-08 NOTE — Telephone Encounter (Signed)
 Left patient a vm regarding upcoming appointment

## 2023-07-08 NOTE — Telephone Encounter (Signed)
[name] notified.

## 2023-07-09 ENCOUNTER — Inpatient Hospital Stay

## 2023-07-09 ENCOUNTER — Inpatient Hospital Stay: Attending: Hematology

## 2023-07-10 ENCOUNTER — Other Ambulatory Visit: Payer: Self-pay

## 2023-07-10 ENCOUNTER — Telehealth: Payer: Self-pay | Admitting: Physical Medicine and Rehabilitation

## 2023-07-10 ENCOUNTER — Encounter: Admitting: Occupational Therapy

## 2023-07-10 ENCOUNTER — Ambulatory Visit

## 2023-07-10 DIAGNOSIS — D693 Immune thrombocytopenic purpura: Secondary | ICD-10-CM

## 2023-07-10 DIAGNOSIS — D6861 Antiphospholipid syndrome: Secondary | ICD-10-CM

## 2023-07-10 NOTE — Telephone Encounter (Signed)
 Stafford Eagles from [facility] is calling on behalf of patient to inquire about the status of orders for speech therapy 1x/wk for 9wks. She can be reached at [phone].

## 2023-07-10 NOTE — Progress Notes (Signed)
 HEMATOLOGY/ONCOLOGY CLINIC NOTE  Date of Service: 07/11/2023  Patient Care Team: Jacqueline Lorenzo, MD as PCP - General (Pediatrics)  CHIEF COMPLAINTS/PURPOSE OF CONSULTATION:  Mx of thrombocytopenia and antiphospholipid antibody syndrome  HISTORY OF PRESENTING ILLNESS:  Jacqueline [name] is a wonderful 23 y.o. female who has been referred to us  by Jacqueline Lorenzo, MD for evaluation and management of Acute pulmonary embolism.  Patient presented to the ED on 04/19/2022 for right-sided headache.  Today, she reports that since being hospitalized in February, she does experience pain in her right lower lungs. Patient denies any medical issues prior to her hospitalization. Her symptoms progressively worsened over the course of 3 day prior to her hospitalization. Patient had no leg pain or swelling at the time and no obvious clots were  found on US . She did lose about 30 pounds over the course of one month prior to her February hospitalization. Patient's weight has since stabilized. Patient was not on any other supplements or medications prior to being hospitalized.  Patient was deficient iron , folic acid , and B12 in February. She reports that she generally consumed meals once daily due to working frequently. Patient does regularly drink water .  She complains of severe headaches which are persistent and occur during the night. Her headaches have occurred for about a month. She notes that the tension/pain only occurs in her right temple. Patient does experience flashing light symptoms with her headaches. CT venogram 04/19/2022 did not show any abnormal findings.  Patient continues to have a lingering cough since  being infected with pneumonia. She denies any recent viral or COVID-19 infections. Her breathing has slightly improved. Patient continues to have a dry and productive cough with phlegm. She did smoke two cigarettes daily of tobacco-based Black and Mild.  She does generally take Eliquis  on  a regular basis, but has not for the past 3 days due to running out. Patient has since had this refilled.   She did previously have a Nexplanon  implantation, but had it removed in December 2023. She complains of heavy menstrual bleeding with plenty of blood clots. Her menstrual cycles typically last 5 days. Her menstrual cycle was regular after her Nexplanon  implant was removed. Patient does not wish to be on any birth control at this time. She did endorse menstrual cycles while being off of blood thinners.   She reports that her grandmother had breast cancer and passed away at 56. She did not have any genetic mutations. She denies any fhx of autoimmune conditions but does note a fhx of diabetes mellitus. She denies any hx of blood clot in past. She denies any family history of blood clots.  She denies any new lumps/bumps, back pain, abdominal pain, pain over the sinuses, or leg swelling. She denies any joint pain, new rashes, nausea, or dizziness. She denies any long distance travel. Patient has a 66 and 68 year-old child.   INTERVAL HISTORY:  Jacqueline [name] is a 23 y.o. female here for continued evaluation and management of iron  deficiency anemia, Acute pulmonary embolism, and thrombocytopenia.   She was last seen by me on 07/02/2023.   Today, she reports that her periods have not been too bothersome.   Patient denies any abdominal pain or bleeding issues.   She reports that she lives with her sister at this time.  MEDICAL HISTORY:  Past Medical History:  Diagnosis Date   Bacterial vaginitis 07/10/2016   Candida vaginitis 07/24/2016   Headache in pregnancy, antepartum, third trimester 07/24/2016  Kell isoimmunization during pregnancy     SURGICAL HISTORY: Past Surgical History:  Procedure Laterality Date   CESAREAN SECTION N/A 09/04/2016   Procedure: CESAREAN SECTION;  Surgeon: Malka Sea, DO;  Location: [facility];  Service: Obstetrics;  Laterality: N/A;   CESAREAN  SECTION Bilateral    IR CT HEAD LTD  02/21/2023   IR GASTROSTOMY TUBE MOD SED  03/21/2023   IR GASTROSTOMY TUBE REMOVAL  05/14/2023   IR PERCUTANEOUS ART THROMBECTOMY/INFUSION INTRACRANIAL INC DIAG ANGIO  02/21/2023   IR REPLC GASTRO/COLONIC TUBE PERCUT W/FLUORO  03/27/2023   RADIOLOGY WITH ANESTHESIA N/A 02/21/2023   Procedure: IR WITH ANESTHESIA;  Surgeon: Luellen Sages, MD;  Location: [facility];  Service: Radiology;  Laterality: N/A;    SOCIAL HISTORY: Social History   Socioeconomic History   Marital status: Single    Spouse name: Not on file   Number of children: Not on file   Years of education: Not on file   Highest education level: Not on file  Occupational History   Not on file  Tobacco Use   Smoking status: Former    Types: Cigars   Smokeless tobacco: Never   Tobacco comments:    1 B&M per day  Vaping Use   Vaping status: Never Used  Substance and Sexual Activity   Alcohol use: Not Currently   Drug use: No   Sexual activity: Yes    Partners: Male    Birth control/protection: None  Other Topics Concern   Not on file  Social History Narrative   [phone]- patient's confidential cell   Social Drivers of Health   Financial Resource Strain: Not on File (06/21/2021)   Received from [employer], [employer]    Financial Resource Strain: 0  Food Insecurity: No Food Insecurity (05/07/2023)   Hunger Vital Sign    Worried About Running Out of Food in the Last Year: Never true    Ran Out of Food in the Last Year: Never true  Transportation Needs: No Transportation Needs (05/07/2023)   PRAPARE - Administrator, Civil Service (Medical): No    Lack of Transportation (Non-Medical): No  Physical Activity: Not on File (06/21/2021)   Received from [place], [place]   Physical Activity    Physical Activity: 0  Stress: Not on File (06/21/2021)   Received from [facility], [place]   Stress    Stress: 0  Social Connections: Not on File (11/16/2022)   Received  from [facility]   Social Connections    Connectedness: 0  Intimate Partner Violence: Patient Declined (05/07/2023)   Humiliation, Afraid, Rape, and Kick questionnaire    Fear of Current or Ex-Partner: Patient declined    Emotionally Abused: Patient declined    Physically Abused: Patient declined    Sexually Abused: Patient declined    FAMILY HISTORY: Family History  Problem Relation Age of Onset   Hypertension Maternal Grandmother    Diabetes Maternal Grandmother    Cancer Neg Hx     ALLERGIES:  has no known allergies.  MEDICATIONS:  Current Outpatient Medications  Medication Sig Dispense Refill   acetaminophen  (TYLENOL ) 325 MG tablet Take 2 tablets (650 mg total) by mouth every 4 (four) hours as needed for mild pain (pain score 1-3) (or temp > 37.5 C (99.5 F)).     apixaban  (ELIQUIS ) 2.5 MG TABS tablet Take 1 tablet (2.5 mg total) by mouth 2 (two) times daily. 60 tablet 1   clonazePAM  (KLONOPIN ) 0.5 MG  tablet Take 1 tablet (0.5 mg total) by mouth at bedtime. 30 tablet 0   cyanocobalamin  1000 MCG tablet Take 2 tablets (2,000 mcg total) by mouth daily. 60 tablet 0   docusate sodium  (COLACE) 100 MG capsule Take 1 capsule (100 mg total) by mouth 3 (three) times daily. (Patient not taking: Reported on 05/22/2023)     FLUoxetine  (PROZAC ) 10 MG capsule Take 1 capsule by mouth daily.     folic acid  (FOLVITE ) 1 MG tablet Take 1 tablet (1 mg total) by mouth daily. 30 tablet 0   gabapentin  (NEURONTIN ) 100 MG capsule Take 1 capsule (100 mg total) by mouth 3 (three) times daily. 90 capsule 1   iron  polysaccharides (NIFEREX) 150 MG capsule Take 1 capsule (150 mg total) by mouth daily. 30 capsule 0   lidocaine -prilocaine  (EMLA ) cream Apply to affected area once 30 g 3   Multiple Vitamin (MULTIVITAMIN WITH MINERALS) TABS tablet Take 1 tablet by mouth daily.     naloxone  (NARCAN ) nasal spray 4 mg/0.1 mL Place 1 spray into the nose as needed (opioid overdose).     oxyCODONE  (OXY IR/ROXICODONE ) 5 MG  immediate release tablet Take 1 tablet (5 mg total) by mouth every 4 (four) hours as needed (perceived pain). 30 tablet 0   polyethylene glycol (MIRALAX  / GLYCOLAX ) 17 g packet Take 17 g by mouth 2 (two) times daily. (Patient not taking: Reported on 05/22/2023)     predniSONE  (DELTASONE ) 20 MG tablet Take 2 tablets (40 mg total) by mouth daily with breakfast for 14 days, THEN 1 tablet (20 mg total) daily with breakfast for 14 days, THEN 0.5 tablets (10 mg total) daily with breakfast for 14 days, THEN 0.5 tablets (10 mg total) every other day for 14 days. 52.5 tablet 0   No current facility-administered medications for this visit.    REVIEW OF SYSTEMS:    10 Point review of Systems was done is negative except as noted above.   PHYSICAL EXAMINATION: ECOG PERFORMANCE STATUS: 1 - Symptomatic but completely ambulatory VSS  GENERAL:alert, in no acute distress and comfortable SKIN: no acute rashes, no significant lesions EYES: conjunctiva are pink and non-injected, sclera anicteric OROPHARYNX: MMM, no exudates, no oropharyngeal erythema or ulceration NECK: supple, no JVD LYMPH:  no palpable lymphadenopathy in the cervical, axillary or inguinal regions LUNGS: clear to auscultation b/l with normal respiratory effort HEART: regular rate & rhythm ABDOMEN:  normoactive bowel sounds , non tender, not distended. Extremity: no pedal edema  LABORATORY DATA:  I have reviewed the data as listed .    Latest Ref Rng & Units 07/11/2023    8:56 AM 07/02/2023    9:15 AM 05/28/2023    3:05 PM  CBC  WBC 4.0 - 10.5 K/uL 5.3  12.6  10.5   Hemoglobin 12.0 - 15.0 g/dL 54.0  98.1  19.1   Hematocrit 36.0 - 46.0 % 36.0  37.1  31.7   Platelets 150 - 400 K/uL 112  110  79    .    Latest Ref Rng & Units 07/11/2023    8:56 AM 07/02/2023    9:15 AM 05/28/2023    3:05 PM  CMP  Glucose 70 - 99 mg/dL 57  96  85   BUN 6 - 20 mg/dL 21  21  11    Creatinine 0.44 - 1.00 mg/dL 4.78  2.95  6.21   Sodium 135 - 145 mmol/L  138  137  137   Potassium 3.5 - 5.1 mmol/L  4.0  3.2  4.1   Chloride 98 - 111 mmol/L 107  107  108   CO2 22 - 32 mmol/L 25  22  25    Calcium 8.9 - 10.3 mg/dL 8.7  8.9  9.1   Total Protein 6.5 - 8.1 g/dL 7.0  6.9  7.7   Total Bilirubin 0.0 - 1.2 mg/dL 0.3  0.2  0.3   Alkaline Phos 38 - 126 U/L 71  61  50   AST 15 - 41 U/L 20  14  23    ALT 0 - 44 U/L 26  15  15     . Lab Results  Component Value Date   IRON  71 05/28/2023   TIBC 262 05/28/2023   IRONPCTSAT 27 05/28/2023   (Iron  and TIBC)  Lab Results  Component Value Date   FERRITIN 196 05/28/2023     RADIOGRAPHIC STUDIES: I have personally reviewed the radiological images as listed and agreed with the findings in the report. No results found.   ASSESSMENT & PLAN:   23 y.o. female with:  Acute on chronic thrombocytopenia - likely multifactorial.  Some component of drop due to consumption from significant menstrual bleeding. 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 No overt associated autoimmune condition at this time associated with the antiphospholipid antibody syndrome at this time.  -  Platelets continuing to improve 79k today.  - Status post IVIG x2 doses, completed 05/13/23 and on Prednisone  taper. -S/p one unit platelets on 3/9.   2. Anemia IDA History of folate deficiency History of Menorrhagia - IDA due to menorrhagia which she has had prior to anticoagulation and now worse with Anticoagulation and thrombocytopenia. Currently periods are over.  - Status post prbc transfusions this  - Follow up with Ob/Gyn to help control periods. Was previously on Megace  which might increase risk of VTE. Also would not recommend systemic hormonal birth control pills due to high risk of VTE in the context of previous h/o PE and antiphospholipid antibody syndrome.    3. Positive lupus  anticoagulant History of unprovoked PE - Lupus anticoagulant was identified once in 2024. - Lupus anticoagulant persistently strongly positive and confirmed with Hexagonal phospholipid neutralization assay.  - remains at high risk of venous and arterial thrombosis as well as bleeding if she were put on therapeutic anticoagulation at this time  - Status post Eliquis  which was stopped in June 2024.  However was restarted due to recent stroke.  Last dose 05/05/2023.   - Restarted Eliquis  2.5 mg po bid as platelets now over 50k. HOLD if platelets noted to be below 50k. - Recommend increase to full dose of 5mg  po BID if platelets continue to improve and remain stable.  - Initiated IVIG 1g/kg q24h x 2 doses with tylenol , loratadine  and solumedrol as premedications and is on prednisone  taper.   4. 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     - Noted difficulty with speech and word finding - Continue supportive care   PLAN:  -Discussed lab results on 07/11/2023 in detail with patient. CBC showed WBC of 5.3K, hemoglobin of 12.9, and platelets of 112K. -platelets stable -hgb stable -iron  levels improved. Iron  labs show that ferritin improved from 8 ng/mL two months ago to 196 ng/mL 1 month ago -continue eliquis  2.5mg  po BID at this time -discussed that if her platelets remain  stable, there would be consideration of full-dose eliquis  at 5 MG po BID -will hold off on Lupron given that her periods have not been significant -patient missed her originally scheduled cycle 2 Rituxan  treatment -patient is agreeable to proceed with cycle 2 day 1 of Rituxan  treatment today will continue with same supportive medications -patient counseled again about importance of not missing appointments FOLLOW-UP: Plz schedule for C3 and C4 of weekly Rituxan  with labs MD visit with labs in 4 weeks  The total time spent in the  appointment was 32 minutes* .  All of the patient's questions were answered with apparent satisfaction. The patient knows to call the clinic with any problems, questions or concerns.   Jacquelyn Matt MD MS AAHIVMS [facility] Hematology/Oncology Physician [facility]  .*Total Encounter Time as defined by the Centers for Medicare and Medicaid Services includes, in addition to the face-to-face time of a patient visit (documented in the note above) non-face-to-face time: obtaining and reviewing outside history, ordering and reviewing medications, tests or procedures, care coordination (communications with other health care professionals or caregivers) and documentation in the medical record.     I,Mitra Faeizi,acting as a Neurosurgeon for Jacquelyn Matt, MD.,have documented all relevant documentation on the behalf of Jacquelyn Matt, MD,as directed by  Jacquelyn Matt, MD while in the presence of Jacquelyn Matt, MD.  .I have reviewed the above documentation for accuracy and completeness, and I agree with the above. .Shaquna Geigle Kishore Kole Hilyard MD

## 2023-07-11 ENCOUNTER — Inpatient Hospital Stay (HOSPITAL_BASED_OUTPATIENT_CLINIC_OR_DEPARTMENT_OTHER): Admitting: Hematology

## 2023-07-11 ENCOUNTER — Inpatient Hospital Stay: Attending: Hematology

## 2023-07-11 ENCOUNTER — Encounter: Payer: Medicaid Other | Admitting: Family

## 2023-07-11 ENCOUNTER — Inpatient Hospital Stay

## 2023-07-11 VITALS — BP 109/67 | HR 85 | Temp 98.0°F | Resp 17 | Ht 64.0 in | Wt 178.0 lb

## 2023-07-11 DIAGNOSIS — D6861 Antiphospholipid syndrome: Secondary | ICD-10-CM

## 2023-07-11 DIAGNOSIS — Z833 Family history of diabetes mellitus: Secondary | ICD-10-CM | POA: Diagnosis not present

## 2023-07-11 DIAGNOSIS — Z79899 Other long term (current) drug therapy: Secondary | ICD-10-CM | POA: Insufficient documentation

## 2023-07-11 DIAGNOSIS — R519 Headache, unspecified: Secondary | ICD-10-CM | POA: Diagnosis not present

## 2023-07-11 DIAGNOSIS — Z7901 Long term (current) use of anticoagulants: Secondary | ICD-10-CM | POA: Diagnosis not present

## 2023-07-11 DIAGNOSIS — Z7952 Long term (current) use of systemic steroids: Secondary | ICD-10-CM | POA: Insufficient documentation

## 2023-07-11 DIAGNOSIS — Z8249 Family history of ischemic heart disease and other diseases of the circulatory system: Secondary | ICD-10-CM | POA: Insufficient documentation

## 2023-07-11 DIAGNOSIS — Z86711 Personal history of pulmonary embolism: Secondary | ICD-10-CM | POA: Diagnosis not present

## 2023-07-11 DIAGNOSIS — D500 Iron deficiency anemia secondary to blood loss (chronic): Secondary | ICD-10-CM

## 2023-07-11 DIAGNOSIS — N920 Excessive and frequent menstruation with regular cycle: Secondary | ICD-10-CM | POA: Diagnosis not present

## 2023-07-11 DIAGNOSIS — D696 Thrombocytopenia, unspecified: Secondary | ICD-10-CM | POA: Diagnosis not present

## 2023-07-11 DIAGNOSIS — Z87891 Personal history of nicotine dependence: Secondary | ICD-10-CM | POA: Diagnosis not present

## 2023-07-11 DIAGNOSIS — D693 Immune thrombocytopenic purpura: Secondary | ICD-10-CM | POA: Diagnosis not present

## 2023-07-11 DIAGNOSIS — I2699 Other pulmonary embolism without acute cor pulmonale: Secondary | ICD-10-CM | POA: Insufficient documentation

## 2023-07-11 DIAGNOSIS — Z803 Family history of malignant neoplasm of breast: Secondary | ICD-10-CM | POA: Diagnosis not present

## 2023-07-11 DIAGNOSIS — R059 Cough, unspecified: Secondary | ICD-10-CM | POA: Insufficient documentation

## 2023-07-11 DIAGNOSIS — I69351 Hemiplegia and hemiparesis following cerebral infarction affecting right dominant side: Secondary | ICD-10-CM | POA: Insufficient documentation

## 2023-07-11 DIAGNOSIS — D509 Iron deficiency anemia, unspecified: Secondary | ICD-10-CM | POA: Diagnosis not present

## 2023-07-11 LAB — CMP (CANCER CENTER ONLY)
ALT: 26 U/L (ref 0–44)
AST: 20 U/L (ref 15–41)
Albumin: 3.9 g/dL (ref 3.5–5.0)
Alkaline Phosphatase: 71 U/L (ref 38–126)
Anion gap: 6 (ref 5–15)
BUN: 21 mg/dL — ABNORMAL HIGH (ref 6–20)
CO2: 25 mmol/L (ref 22–32)
Calcium: 8.7 mg/dL — ABNORMAL LOW (ref 8.9–10.3)
Chloride: 107 mmol/L (ref 98–111)
Creatinine: 0.95 mg/dL (ref 0.44–1.00)
GFR, Estimated: >60 mL/min (ref >60)
Glucose, Bld: 57 mg/dL — ABNORMAL LOW (ref 70–99)
Potassium: 4 mmol/L (ref 3.5–5.1)
Sodium: 138 mmol/L (ref 135–145)
Total Bilirubin: 0.3 mg/dL (ref 0.0–1.2)
Total Protein: 7 g/dL (ref 6.5–8.1)

## 2023-07-11 LAB — CBC WITH DIFFERENTIAL (CANCER CENTER ONLY)
Abs Immature Granulocytes: 0.01 10*3/uL (ref 0.00–0.07)
Basophils Absolute: 0 10*3/uL (ref 0.0–0.1)
Basophils Relative: 1 %
Eosinophils Absolute: 0 10*3/uL (ref 0.0–0.5)
Eosinophils Relative: 1 %
HCT: 36 % (ref 36.0–46.0)
Hemoglobin: 12.9 g/dL (ref 12.0–15.0)
Immature Granulocytes: 0 %
Lymphocytes Relative: 40 %
Lymphs Abs: 2.2 10*3/uL (ref 0.7–4.0)
MCH: 28.3 pg (ref 26.0–34.0)
MCHC: 35.8 g/dL (ref 30.0–36.0)
MCV: 78.9 fL — ABNORMAL LOW (ref 80.0–100.0)
Monocytes Absolute: 0.8 10*3/uL (ref 0.1–1.0)
Monocytes Relative: 14 %
Neutro Abs: 2.4 10*3/uL (ref 1.7–7.7)
Neutrophils Relative %: 44 %
Platelet Count: 112 10*3/uL — ABNORMAL LOW (ref 150–400)
RBC: 4.56 MIL/uL (ref 3.87–5.11)
RDW: 15.9 % — ABNORMAL HIGH (ref 11.5–15.5)
WBC Count: 5.3 10*3/uL (ref 4.0–10.5)
nRBC: 0 % (ref 0.0–0.2)

## 2023-07-11 MED ORDER — ACETAMINOPHEN 325 MG PO TABS
650.0000 mg | ORAL_TABLET | Freq: Once | ORAL | Status: AC
  Administered 2023-07-11: 650 mg via ORAL
  Filled 2023-07-11: qty 2

## 2023-07-11 MED ORDER — RITUXIMAB-PVVR CHEMO 500 MG/50ML IV SOLN
375.0000 mg/m2 | Freq: Once | INTRAVENOUS | Status: AC
  Administered 2023-07-11: 700 mg via INTRAVENOUS
  Filled 2023-07-11: qty 20

## 2023-07-11 MED ORDER — FAMOTIDINE IN NACL 20-0.9 MG/50ML-% IV SOLN
20.0000 mg | Freq: Once | INTRAVENOUS | Status: AC
  Administered 2023-07-11: 20 mg via INTRAVENOUS
  Filled 2023-07-11: qty 50

## 2023-07-11 MED ORDER — SODIUM CHLORIDE 0.9 % IV SOLN: INTRAVENOUS | Status: DC

## 2023-07-11 MED ORDER — METHYLPREDNISOLONE SODIUM SUCC 125 MG IJ SOLR
125.0000 mg | Freq: Once | INTRAMUSCULAR | Status: AC
Start: 2023-07-11 — End: 2023-07-11
  Administered 2023-07-11: 125 mg via INTRAVENOUS
  Filled 2023-07-11: qty 2

## 2023-07-11 MED ORDER — DIPHENHYDRAMINE HCL 25 MG PO CAPS
50.0000 mg | ORAL_CAPSULE | Freq: Once | ORAL | Status: AC
  Administered 2023-07-11: 50 mg via ORAL
  Filled 2023-07-11: qty 2

## 2023-07-11 MED ORDER — SODIUM CHLORIDE 0.9 % IV SOLN: 375.0000 mg/m2 | Freq: Once | INTRAVENOUS | Status: DC

## 2023-07-11 NOTE — Patient Instructions (Addendum)
 [facility] - A DEPT OF [facility]  Discharge Instructions: Thank you for choosing [facility] to provide your oncology and hematology care.   If you have a lab appointment with the Cancer Center, please go directly to the Cancer Center and check in at the registration area.   Wear comfortable clothing and clothing appropriate for easy access to any Portacath or PICC line.   We strive to give you quality time with your provider. You may need to reschedule your appointment if you arrive late (15 or more minutes).  Arriving late affects you and other patients whose appointments are after yours.  Also, if you miss three or more appointments without notifying the office, you may be dismissed from the clinic at the provider's discretion.      For prescription refill requests, have your pharmacy contact our office and allow 72 hours for refills to be completed.    Today you received the following chemotherapy and/or immunotherapy agents Rituxan       To help prevent nausea and vomiting after your treatment, we encourage you to take your nausea medication as directed.  BELOW ARE SYMPTOMS THAT SHOULD BE REPORTED IMMEDIATELY: *FEVER GREATER THAN 100.4 F (38 C) OR HIGHER *CHILLS OR SWEATING *NAUSEA AND VOMITING THAT IS NOT CONTROLLED WITH YOUR NAUSEA MEDICATION *UNUSUAL SHORTNESS OF BREATH *UNUSUAL BRUISING OR BLEEDING *URINARY PROBLEMS (pain or burning when urinating, or frequent urination) *BOWEL PROBLEMS (unusual diarrhea, constipation, pain near the anus) TENDERNESS IN MOUTH AND THROAT WITH OR WITHOUT PRESENCE OF ULCERS (sore throat, sores in mouth, or a toothache) UNUSUAL RASH, SWELLING OR PAIN  UNUSUAL VAGINAL DISCHARGE OR ITCHING   Items with * indicate a potential emergency and should be followed up as soon as possible or go to the Emergency Department if any problems should occur.  Please show the CHEMOTHERAPY ALERT CARD or IMMUNOTHERAPY  ALERT CARD at check-in to the Emergency Department and triage nurse.  Should you have questions after your visit or need to cancel or reschedule your appointment, please contact [facility] - A DEPT OF [facility]  Dept: [phone]  and follow the prompts.  Office hours are 8:00 a.m. to 4:30 p.m. Monday - Friday. Please note that voicemails left after 4:00 p.m. may not be returned until the following business day.  We are closed weekends and major holidays. You have access to a nurse at all times for urgent questions. Please call the main number to the clinic Dept: [phone] and follow the prompts.   For any non-urgent questions, you may also contact your provider using MyChart. We now offer e-Visits for anyone 66 and older to request care online for non-urgent symptoms. For details visit [email or web address].   Also download the MyChart app! Go to the app store, search "MyChart", open the app, select [facility], and log in with your MyChart username and password.  Rituximab  Injection What is this medication? RITUXIMAB  (ri TUX i mab) treats leukemia and lymphoma. It works by blocking a protein that causes cancer cells to grow and multiply. This helps to slow or stop the spread of cancer cells. It may also be used to treat autoimmune conditions, such as arthritis. It works by slowing down an overactive immune system. It is a monoclonal antibody. This medicine may be used for other purposes; ask your health care provider or pharmacist if you have questions. COMMON BRAND NAME(S): RIABNI , Rituxan , RUXIENCE , truxima   What should I tell my care team before I take this medication? They need to know if you have any of these conditions: Chest pain Heart disease Immune system problems Infection, such as chickenpox, cold sores, hepatitis B, herpes Irregular heartbeat or rhythm Kidney disease Low blood counts, such as low [place] cells, platelets, red cells Lung  disease Recent or upcoming vaccine An unusual or allergic reaction to rituximab , other medications, foods, dyes, or preservatives Pregnant or trying to get pregnant Breast-feeding How should I use this medication? This medication is injected into a vein. It is given by a care team in a hospital or clinic setting. A special MedGuide will be given to you before each treatment. Be sure to read this information carefully each time. Talk to your care team about the use of this medication in children. While this medication may be prescribed for children as young as 6 months for selected conditions, precautions do apply. Overdosage: If you think you have taken too much of this medicine contact a poison control center or emergency room at once. NOTE: This medicine is only for you. Do not share this medicine with others. What if I miss a dose? Keep appointments for follow-up doses. It is important not to miss your dose. Call your care team if you are unable to keep an appointment. What may interact with this medication? Do not take this medication with any of the following: Live vaccines This medication may also interact with the following: Cisplatin This list may not describe all possible interactions. Give your health care provider a list of all the medicines, herbs, non-prescription drugs, or dietary supplements you use. Also tell them if you smoke, drink alcohol, or use illegal drugs. Some items may interact with your medicine. What should I watch for while using this medication? Your condition will be monitored carefully while you are receiving this medication. You may need blood work while taking this medication. This medication can cause serious infusion reactions. To reduce the risk your care team may give you other medications to take before receiving this one. Be sure to follow the directions from your care team. This medication may increase your risk of getting an infection. Call your care  team for advice if you get a fever, chills, sore throat, or other symptoms of a cold or flu. Do not treat yourself. Try to avoid being around people who are sick. Call your care team if you are around anyone with measles, chickenpox, or if you develop sores or blisters that do not heal properly. Avoid taking medications that contain aspirin , acetaminophen , ibuprofen , naproxen , or ketoprofen unless instructed by your care team. These medications may hide a fever. This medication may cause serious skin reactions. They can happen weeks to months after starting the medication. Contact your care team right away if you notice fevers or flu-like symptoms with a rash. The rash may be red or purple and then turn into blisters or peeling of the skin. You may also notice a red rash with swelling of the face, lips, or lymph nodes in your neck or under your arms. In some patients, this medication may cause a serious brain infection that may cause death. If you have any problems seeing, thinking, speaking, walking, or standing, tell your care team right away. If you cannot reach your care team, urgently seek another source of medical care. Talk to your care team if you may be pregnant. Serious birth defects can occur if you take this medication during pregnancy  and for 12 months after the last dose. You will need a negative pregnancy test before starting this medication. Contraception is recommended while taking this medication and for 12 months after the last dose. Your care team can help you find the option that works for you. Do not breastfeed while taking this medication and for at least 6 months after the last dose. What side effects may I notice from receiving this medication? Side effects that you should report to your care team as soon as possible: Allergic reactions or angioedema--skin rash, itching or hives, swelling of the face, eyes, lips, tongue, arms, or legs, trouble swallowing or breathing Bowel  blockage--stomach cramping, unable to have a bowel movement or pass gas, loss of appetite, vomiting Dizziness, loss of balance or coordination, confusion or trouble speaking Heart attack--pain or tightness in the chest, shoulders, arms, or jaw, nausea, shortness of breath, cold or clammy skin, feeling faint or lightheaded Heart rhythm changes--fast or irregular heartbeat, dizziness, feeling faint or lightheaded, chest pain, trouble breathing Infection--fever, chills, cough, sore throat, wounds that don't heal, pain or trouble when passing urine, general feeling of discomfort or being unwell Infusion reactions--chest pain, shortness of breath or trouble breathing, feeling faint or lightheaded Kidney injury--decrease in the amount of urine, swelling of the ankles, hands, or feet Liver injury--right upper belly pain, loss of appetite, nausea, light-colored stool, dark yellow or brown urine, yellowing skin or eyes, unusual weakness or fatigue Redness, blistering, peeling, or loosening of the skin, including inside the mouth Stomach pain that is severe, does not go away, or gets worse Tumor lysis syndrome (TLS)--nausea, vomiting, diarrhea, decrease in the amount of urine, dark urine, unusual weakness or fatigue, confusion, muscle pain or cramps, fast or irregular heartbeat, joint pain Side effects that usually do not require medical attention (report to your care team if they continue or are bothersome): Headache Joint pain Nausea Runny or stuffy nose Unusual weakness or fatigue This list may not describe all possible side effects. Call your doctor for medical advice about side effects. You may report side effects to FDA at 1-800-FDA-1088. Where should I keep my medication? This medication is given in a hospital or clinic. It will not be stored at home. NOTE: This sheet is a summary. It may not cover all possible information. If you have questions about this medicine, talk to your doctor, pharmacist,  or health care provider.  2024 Elsevier/Gold Standard (2021-07-12 00:00:00)

## 2023-07-11 NOTE — Progress Notes (Signed)
 Erroneous encounter-disregard

## 2023-07-11 NOTE — Progress Notes (Signed)
 Rapid Infusion Rituximab  Pharmacist Evaluation  Jacqueline [name] is a 23 y.o. 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 This patient may be considered for RIR.   A pharmacist has verified the patient tolerated rituximab  infusions per the [facility] standard infusion protocol without grade 3-4 infusion reactions. The treatment plan will be updated to reflect RIR if the patient qualifies per the checklist below:   Age > 34 years old Yes   Clinically significant cardiovascular disease No   Circulating lymphocyte count < 5000/uL prior to cycle two Yes  Lab Results  Component Value Date   LYMPHSABS 2.2 07/11/2023    Prior documented grade 3-4 infusion reaction to rituximab  No   Prior documented grade 1-2 infusion reaction to rituximab  (If YES, Pharmacist will confirm with Physician if patient is still a candidate for RIR) No   Previous rituximab  infusion within the past 6 months Yes   Treatment Plan updated orders to reflect RIR Yes    [name] does meet the criteria for Rapid Infusion Rituximab . This patient is going to be switched to rapid infusion rituximab .   Glinda Natzke, Pharm.D., CPP 07/11/2023@10 :09 AM

## 2023-07-16 NOTE — Progress Notes (Incomplete)
 HEMATOLOGY/ONCOLOGY CLINIC NOTE  Date of Service: 07/18/2023  Patient Care Team: [name], MD as PCP - General (Pediatrics)  CHIEF COMPLAINTS/PURPOSE OF CONSULTATION:  Mx of thrombocytopenia and antiphospholipid antibody syndrome  HISTORY OF PRESENTING ILLNESS:  Jacqueline [name] is a wonderful 23 y.o. female who has been referred to us  by [name], MD for evaluation and management of Acute pulmonary embolism.  Patient presented to the ED on 04/19/2022 for right-sided headache.  Today, she reports that since being hospitalized in February, she does experience pain in her right lower lungs. Patient denies any medical issues prior to her hospitalization. Her symptoms progressively worsened over the course of 3 day prior to her hospitalization. Patient had no leg pain or swelling at the time and no obvious clots were  found on US . She did lose about 30 pounds over the course of one month prior to her February hospitalization. Patient's weight has since stabilized. Patient was not on any other supplements or medications prior to being hospitalized.  Patient was deficient iron , folic acid , and B12 in February. She reports that she generally consumed meals once daily due to working frequently. Patient does regularly drink water .  She complains of severe headaches which are persistent and occur during the night. Her headaches have occurred for about a month. She notes that the tension/pain only occurs in her right temple. Patient does experience flashing light symptoms with her headaches. CT venogram 04/19/2022 did not show any abnormal findings.  Patient continues to have a lingering cough since  being infected with pneumonia. She denies any recent viral or COVID-19 infections. Her breathing has slightly improved. Patient continues to have a dry and productive cough with phlegm. She did smoke two cigarettes daily of tobacco-based Black and Mild.  She does generally take Eliquis   on a regular basis, but has not for the past 3 days due to running out. Patient has since had this refilled.   She did previously have a Nexplanon  implantation, but had it removed in December 2023. She complains of heavy menstrual bleeding with plenty of blood clots. Her menstrual cycles typically last 5 days. Her menstrual cycle was regular after her Nexplanon  implant was removed. Patient does not wish to be on any birth control at this time. She did endorse menstrual cycles while being off of blood thinners.   She reports that her grandmother had breast cancer and passed away at 67. She did not have any genetic mutations. She denies any fhx of autoimmune conditions but does note a fhx of diabetes mellitus. She denies any hx of blood clot in past. She denies any family history of blood clots.  She denies any new lumps/bumps, back pain, abdominal pain, pain over the sinuses, or leg swelling. She denies any joint pain, new rashes, nausea, or dizziness. She denies any long distance travel. Patient has a 26 and 77 year-old child.   INTERVAL HISTORY:  Jacqueline [name] is a 23 y.o. female here for continued evaluation and management of iron  deficiency anemia, Acute pulmonary embolism, and thrombocytopenia. She was last seen by me on 07/11/2023.   -Discussed lab results on 07/18/2023 in detail with patient. CBC showed WBC of ***K, hemoglobin of ***, and platelets of ***K. -   MEDICAL HISTORY:  Past Medical History:  Diagnosis Date   Bacterial vaginitis 07/10/2016   Candida vaginitis 07/24/2016   Headache in pregnancy, antepartum, third trimester 07/24/2016   Kell isoimmunization during pregnancy     SURGICAL HISTORY: Past  Surgical History:  Procedure Laterality Date   CESAREAN SECTION N/A 09/04/2016   Procedure: CESAREAN SECTION;  Surgeon: Malka Sea, DO;  Location: [facility]IR[facility]ING [facility];  Service: Obstetrics;  Laterality: N/A;   CESAREAN SECTION Bilateral    IR CT HEAD LTD  02/21/2023   IR  GASTROSTOMY TUBE MOD SED  03/21/2023   IR GASTROSTOMY TUBE REMOVAL  05/14/2023   IR PERCUTANEOUS ART THROMBECTOMY/INFUSION INTRACRANIAL INC DIAG ANGIO  02/21/2023   IR REPLC GASTRO/COLONIC TUBE PERCUT W/FLUORO  03/27/2023   RADIOLOGY WITH ANESTHESIA N/A 02/21/2023   Procedure: IR WITH ANESTHESIA;  Surgeon: Luellen Sages, MD;  Location: [facility] OR;  Service: Radiology;  Laterality: N/A;    SOCIAL HISTORY: Social History   Socioeconomic History   Marital status: Single    Spouse name: Not on file   Number of children: Not on file   Years of education: Not on file   Highest education level: Not on file  Occupational History   Not on file  Tobacco Use   Smoking status: Former    Types: Cigars   Smokeless tobacco: Never   Tobacco comments:    1 B&M per day  Vaping Use   Vaping status: Never Used  Substance and Sexual Activity   Alcohol use: Not Currently   Drug use: No   Sexual activity: Yes    Partners: Male    Birth control/protection: None  Other Topics Concern   Not on file  Social History Narrative   [phone]- patient's confidential cell   Social Drivers of Health   Financial Resource Strain: Not on File (06/21/2021)   Received from [employer] [employer]    Financial Resource Strain: 0  Food Insecurity: No Food Insecurity (05/07/2023)   Hunger Vital Sign    Worried About Running Out of Food in the Last Year: Never true    Ran Out of Food in the Last Year: Never true  Transportation Needs: No Transportation Needs (05/07/2023)   PRAPARE - Administrator, Civil Service (Medical): No    Lack of Transportation (Non-Medical): No  Physical Activity: Not on File (06/21/2021)   Received from [place], [place]   Physical Activity    Physical Activity: 0  Stress: Not on File (06/21/2021)   Received from [facility], [place]   Stress    Stress: 0  Social Connections: Not on File (11/16/2022)   Received from [facility]   Social Connections    Connectedness: 0   Intimate Partner Violence: Patient Declined (05/07/2023)   Humiliation, Afraid, Rape, and Kick questionnaire    Fear of Current or Ex-Partner: Patient declined    Emotionally Abused: Patient declined    Physically Abused: Patient declined    Sexually Abused: Patient declined    FAMILY HISTORY: Family History  Problem Relation Age of Onset   Hypertension Maternal Grandmother    Diabetes Maternal Grandmother    Cancer Neg Hx     ALLERGIES:  has no known allergies.  MEDICATIONS:  Current Outpatient Medications  Medication Sig Dispense Refill   acetaminophen  (TYLENOL ) 325 MG tablet Take 2 tablets (650 mg total) by mouth every 4 (four) hours as needed for mild pain (pain score 1-3) (or temp > 37.5 C (99.5 F)).     apixaban  (ELIQUIS ) 2.5 MG TABS tablet Take 1 tablet (2.5 mg total) by mouth 2 (two) times daily. 60 tablet 1   clonazePAM  (KLONOPIN ) 0.5 MG tablet Take 1 tablet (0.5 mg total) by mouth at bedtime.  30 tablet 0   cyanocobalamin  1000 MCG tablet Take 2 tablets (2,000 mcg total) by mouth daily. 60 tablet 0   docusate sodium  (COLACE) 100 MG capsule Take 1 capsule (100 mg total) by mouth 3 (three) times daily. (Patient not taking: Reported on 05/22/2023)     FLUoxetine  (PROZAC ) 10 MG capsule Take 1 capsule by mouth daily.     folic acid  (FOLVITE ) 1 MG tablet Take 1 tablet (1 mg total) by mouth daily. 30 tablet 0   gabapentin  (NEURONTIN ) 100 MG capsule Take 1 capsule (100 mg total) by mouth 3 (three) times daily. 90 capsule 1   iron  polysaccharides (NIFEREX) 150 MG capsule Take 1 capsule (150 mg total) by mouth daily. 30 capsule 0   lidocaine -prilocaine  (EMLA ) cream Apply to affected area once 30 g 3   Multiple Vitamin (MULTIVITAMIN WITH MINERALS) TABS tablet Take 1 tablet by mouth daily.     naloxone  (NARCAN ) nasal spray 4 mg/0.1 mL Place 1 spray into the nose as needed (opioid overdose).     oxyCODONE  (OXY IR/ROXICODONE ) 5 MG immediate release tablet Take 1 tablet (5 mg total) by mouth  every 4 (four) hours as needed (perceived pain). 30 tablet 0   polyethylene glycol (MIRALAX  / GLYCOLAX ) 17 g packet Take 17 g by mouth 2 (two) times daily. (Patient not taking: Reported on 05/22/2023)     predniSONE  (DELTASONE ) 20 MG tablet Take 2 tablets (40 mg total) by mouth daily with breakfast for 14 days, THEN 1 tablet (20 mg total) daily with breakfast for 14 days, THEN 0.5 tablets (10 mg total) daily with breakfast for 14 days, THEN 0.5 tablets (10 mg total) every other day for 14 days. 52.5 tablet 0   No current facility-administered medications for this visit.    REVIEW OF SYSTEMS:    10 Point review of Systems was done is negative except as noted above.   PHYSICAL EXAMINATION: ECOG PERFORMANCE STATUS: 1 - Symptomatic but completely ambulatory  . There were no vitals filed for this visit.  There were no vitals filed for this visit.  .There is no height or weight on file to calculate BMI.   GENERAL:alert, in no acute distress and comfortable SKIN: no acute rashes, no significant lesions EYES: conjunctiva are pink and non-injected, sclera anicteric OROPHARYNX: MMM, no exudates, no oropharyngeal erythema or ulceration NECK: supple, no JVD LYMPH:  no palpable lymphadenopathy in the cervical, axillary or inguinal regions LUNGS: clear to auscultation b/l with normal respiratory effort HEART: regular rate & rhythm ABDOMEN:  normoactive bowel sounds , non tender, not distended. Extremity: no pedal edema PSYCH: alert & oriented x 3 with fluent speech NEURO: no focal motor/sensory deficits    LABORATORY DATA:  I have reviewed the data as listed .    Latest Ref Rng & Units 07/11/2023    8:56 AM 07/02/2023    9:15 AM 05/28/2023    3:05 PM  CBC  WBC 4.0 - 10.5 K/uL 5.3  12.6  10.5   Hemoglobin 12.0 - 15.0 g/dL 16.1  09.6  04.5   Hematocrit 36.0 - 46.0 % 36.0  37.1  31.7   Platelets 150 - 400 K/uL 112  110  79    .    Latest Ref Rng & Units 07/11/2023    8:56 AM 07/02/2023     9:15 AM 05/28/2023    3:05 PM  CMP  Glucose 70 - 99 mg/dL 57  96  85   BUN 6 - 20 mg/dL  21  21  11    Creatinine 0.44 - 1.00 mg/dL 1.61  0.96  0.45   Sodium 135 - 145 mmol/L 138  137  137   Potassium 3.5 - 5.1 mmol/L 4.0  3.2  4.1   Chloride 98 - 111 mmol/L 107  107  108   CO2 22 - 32 mmol/L 25  22  25    Calcium 8.9 - 10.3 mg/dL 8.7  8.9  9.1   Total Protein 6.5 - 8.1 g/dL 7.0  6.9  7.7   Total Bilirubin 0.0 - 1.2 mg/dL 0.3  0.2  0.3   Alkaline Phos 38 - 126 U/L 71  61  50   AST 15 - 41 U/L 20  14  23    ALT 0 - 44 U/L 26  15  15     . Lab Results  Component Value Date   IRON  71 05/28/2023   TIBC 262 05/28/2023   IRONPCTSAT 27 05/28/2023   (Iron  and TIBC)  Lab Results  Component Value Date   FERRITIN 196 05/28/2023     RADIOGRAPHIC STUDIES: I have personally reviewed the radiological images as listed and agreed with the findings in the report. No results found.   ASSESSMENT & PLAN:   23 y.o. female with:  Acute on chronic thrombocytopenia - likely multifactorial.  Some component of drop due to consumption from significant menstrual bleeding. 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 No overt associated autoimmune condition at this time associated with the antiphospholipid antibody syndrome at this time.  -  Platelets continuing to improve 79k today.  - Status post IVIG x2 doses, completed 05/13/23 and on Prednisone  taper. -S/p one unit platelets on 3/9.   2. Anemia IDA History of folate deficiency History of Menorrhagia - IDA due to menorrhagia which she has had prior to anticoagulation and now worse with Anticoagulation and thrombocytopenia. Currently periods are over.  - Status post prbc transfusions this  - Follow up with Ob/Gyn to help control periods. Was previously on Megace  which might increase risk of VTE. Also would not  recommend systemic hormonal birth control pills due to high risk of VTE in the context of previous h/o PE and antiphospholipid antibody syndrome.    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 - Lupus anticoagulant persistently strongly positive and confirmed with Hexagonal phospholipid neutralization assay.  - remains at high risk of venous and arterial thrombosis as well as bleeding if she were put on therapeutic anticoagulation at this time  - Status post Eliquis  which was stopped in June 2024.  However was restarted due to recent stroke.  Last dose 05/05/2023.   - Restarted Eliquis  2.5 mg po bid as platelets now over 50k. HOLD if platelets noted to be below 50k. - Recommend increase to full dose of 5mg  po BID if platelets continue to improve and remain stable.  - Initiated IVIG 1g/kg q24h x 2 doses with tylenol , loratadine  and solumedrol as premedications and is on prednisone  taper.   4. 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     - Noted difficulty with speech and word finding - Continue supportive care   PLAN: -Discuss again with patient and her mother importance of maintaining compliance with her treatments and followups..since she has missed multiple appointments. -Discussed that her APLA  proteins are dropping her platelets and there are concerns of bleeding risks if her platelets are too low as well as clotting if she is not treated. Discussed that there is a need for close attention.  -educated patient that we are treating her for a complex situation in which she has abnormal proteins in the blood causing abnormal blood clots -discussed goal to knock out her abnormal antibodies to improve her platelets and reduce the risk of blood clots  -discussed options to either proceed with treatment to try to avoid a complication or to  focus on keeping the patient comfortable though there could be complications -discussed that whether to consider treatment is a personal and involved choice with frequent follow-up visits and monitoring. Discussed that not receiving treatment would eventually cause a complication.  -patient is inclined to proceed with treatment.  -Discussed that delaying treatment would not be in her best interest  -Will start Rituxan  antibody treatment to reduce the amount of abnormal protein causing her platelets to drop and causing increased risk of blood clots due to Antiphospholipid antibody syndrome --CBC resulted after clinical visit. WBCs 12.6, hgb 13.2, platelets 110K.  -platelets improved from 55K 1.5 months ago, to 79K 1 month ago, to 110K currently with IVIG and steroids. -continue Eliquis  2.5mg  po BID and if platelets remain stable will increase to 5mg  po BID -connect with PCP to consider refilling Clonazepam   -discussed that there is a need for neurology appointment to address neurologic symptoms like headache -continue to follow up with OB/GYN  FOLLOW-UP: ***  The total time spent in the appointment was *** minutes* .  All of the patient's questions were answered with apparent satisfaction. The patient knows to call the clinic with any problems, questions or concerns.   Jacquelyn Matt MD MS AAHIVMS [facility] Hematology/Oncology Physician [facility]  .*Total Encounter Time as defined by the Centers for Medicare and Medicaid Services includes, in addition to the face-to-face time of a patient visit (documented in the note above) non-face-to-face time: obtaining and reviewing outside history, ordering and reviewing medications, tests or procedures, care coordination (communications with other health care professionals or caregivers) and documentation in the medical record.    I,Mitra Faeizi,acting as a Neurosurgeon for Jacquelyn Matt, MD.,have documented all relevant documentation on the  behalf of Jacquelyn Matt, MD,as directed by  Jacquelyn Matt, MD while in the presence of Jacquelyn Matt, MD.  ***

## 2023-07-17 ENCOUNTER — Other Ambulatory Visit: Payer: Self-pay

## 2023-07-17 DIAGNOSIS — D6861 Antiphospholipid syndrome: Secondary | ICD-10-CM

## 2023-07-18 ENCOUNTER — Encounter: Payer: Self-pay | Admitting: Hematology

## 2023-07-18 ENCOUNTER — Inpatient Hospital Stay

## 2023-07-18 ENCOUNTER — Inpatient Hospital Stay: Admitting: Hematology

## 2023-07-18 ENCOUNTER — Telehealth: Payer: Self-pay

## 2023-07-18 NOTE — Telephone Encounter (Signed)
 Approved.

## 2023-07-18 NOTE — Telephone Encounter (Signed)
 I called pt and left a Voicemail stating she needed to call back and reschedule her appointments because she was already  a hour and a half late.

## 2023-07-25 ENCOUNTER — Inpatient Hospital Stay

## 2023-07-25 ENCOUNTER — Inpatient Hospital Stay: Admitting: Hematology

## 2023-07-30 ENCOUNTER — Emergency Department (HOSPITAL_COMMUNITY)
Admission: EM | Admit: 2023-07-30 | Discharge: 2023-07-31 | Disposition: A | Discharge status: Home / Self Care | Attending: Emergency Medicine | Admitting: Emergency Medicine

## 2023-07-30 ENCOUNTER — Emergency Department (HOSPITAL_COMMUNITY)

## 2023-07-30 ENCOUNTER — Encounter (HOSPITAL_COMMUNITY): Payer: Self-pay | Admitting: Emergency Medicine

## 2023-07-30 ENCOUNTER — Other Ambulatory Visit: Payer: Self-pay

## 2023-07-30 ENCOUNTER — Ambulatory Visit: Payer: Self-pay

## 2023-07-30 DIAGNOSIS — Z7901 Long term (current) use of anticoagulants: Secondary | ICD-10-CM | POA: Insufficient documentation

## 2023-07-30 DIAGNOSIS — R519 Headache, unspecified: Secondary | ICD-10-CM | POA: Diagnosis present

## 2023-07-30 HISTORY — DX: Cerebral infarction, unspecified: I63.9

## 2023-07-30 LAB — BASIC METABOLIC PANEL WITH GFR
Anion gap: 9 (ref 5–15)
BUN: 12 mg/dL (ref 6–20)
CO2: 25 mmol/L (ref 22–32)
Calcium: 9.1 mg/dL (ref 8.9–10.3)
Chloride: 104 mmol/L (ref 98–111)
Creatinine, Ser: 0.8 mg/dL (ref 0.44–1.00)
GFR, Estimated: >60 mL/min (ref >60)
Glucose, Bld: 79 mg/dL (ref 70–99)
Potassium: 4.2 mmol/L (ref 3.5–5.1)
Sodium: 138 mmol/L (ref 135–145)

## 2023-07-30 LAB — CBC WITH DIFFERENTIAL/PLATELET
Abs Immature Granulocytes: 0.01 10*3/uL (ref 0.00–0.07)
Basophils Absolute: 0 10*3/uL (ref 0.0–0.1)
Basophils Relative: 1 %
Eosinophils Absolute: 0 10*3/uL (ref 0.0–0.5)
Eosinophils Relative: 0 %
HCT: 38 % (ref 36.0–46.0)
Hemoglobin: 13.1 g/dL (ref 12.0–15.0)
Immature Granulocytes: 0 %
Lymphocytes Relative: 31 %
Lymphs Abs: 1.8 10*3/uL (ref 0.7–4.0)
MCH: 28.5 pg (ref 26.0–34.0)
MCHC: 34.5 g/dL (ref 30.0–36.0)
MCV: 82.6 fL (ref 80.0–100.0)
Monocytes Absolute: 0.8 10*3/uL (ref 0.1–1.0)
Monocytes Relative: 14 %
Neutro Abs: 3.1 10*3/uL (ref 1.7–7.7)
Neutrophils Relative %: 54 %
Platelets: 112 10*3/uL — ABNORMAL LOW (ref 150–400)
RBC: 4.6 MIL/uL (ref 3.87–5.11)
RDW: 14.7 % (ref 11.5–15.5)
WBC: 5.7 10*3/uL (ref 4.0–10.5)
nRBC: 0 % (ref 0.0–0.2)

## 2023-07-30 MED ORDER — ACETAMINOPHEN 325 MG PO TABS
650.0000 mg | ORAL_TABLET | Freq: Once | ORAL | Status: AC
  Administered 2023-07-30: 650 mg via ORAL
  Filled 2023-07-30: qty 2

## 2023-07-30 NOTE — ED Triage Notes (Signed)
 Left sided headache x 4 days. Denies nausea, worsens with lights and sound. Hx of stroke

## 2023-07-30 NOTE — Telephone Encounter (Signed)
 Summary: Headache for 4 days, seeking advice   Copied From CRM [id number]. Reason for Triage: Pt has had headaches for 4 days, no other symptoms.  Jacqueline [name] is requesting a call back, [phone]      Chief Complaint: Right side headache Symptoms: moderate pain on right side  Frequency: constant Pertinent Negatives: Patient denies . Disposition: [x] ED /[] Urgent Care (no appt availability in office) / [] Appointment(In office/virtual)/ []  [facility]/ [] Home Care/ [] Refused Recommended Disposition /[] [place] [facility]/ []  Follow-up with PCP Additional Notes: RN advising ED given extensive history of stroke with neuro deficits this year, as well as low platelets. No PCP at this time. Sister Jacqueline [name] agreeable to take patient to the ED today  Reason for Disposition  [1] New headache AND [2] weak immune system (e.g., HIV positive, cancer chemo, splenectomy, organ transplant, chronic steroids)  Answer Assessment - Initial Assessment Questions 1. LOCATION: "Where does it hurt?"      Right side headache  2. ONSET: "When did the headache start?" (Minutes, hours or days)      4 days   3. PATTERN: "Does the pain come and go, or has it been constant since it started?"     Constant  4. SEVERITY: "How bad is the pain?" and "What does it keep you from doing?"  (e.g., Scale 1-10; mild, moderate, or severe)   - MILD (1-3): doesn't interfere with normal activities    - MODERATE (4-7): interferes with normal activities or awakens from sleep    - SEVERE (8-10): excruciating pain, unable to do any normal activities        *No Answer* 5. RECURRENT SYMPTOM: "Have you ever had headaches before?" If Yes, ask: "When was the last time?" and "What happened that time?"      Yes, has history of stroke  6. CAUSE: "What do you think is causing the headache?"     Unsure of cause  7. MIGRAINE: "Have you been diagnosed with migraine headaches?" If Yes, ask: "Is this headache similar?"       No  8. HEAD INJURY: "Has there been any recent injury to the head?"      *No Answer* 9. OTHER SYMPTOMS: "Do you have any other symptoms?" (fever, stiff neck, eye pain, sore throat, cold symptoms)     No 10. PREGNANCY: "Is there any chance you are pregnant?" "When was your last menstrual period?"       *No Answer*  Protocols used: Headache-A-AH

## 2023-07-30 NOTE — ED Provider Triage Note (Signed)
 Emergency Medicine Provider Triage Evaluation Note  Jacqueline [name] , a 23 y.o. female  was evaluated in triage.  Pt complains of frontal headache x 4 days.  Patient has history of stroke antiphospholipid syndrome.  He denies any numbness tingling or weakness or changes in her speech with this.  Review of Systems  Positive: As above Negative: As above  Physical Exam  BP 104/75 (BP Location: Right Arm)   Pulse 99   Temp 98.6 F (37 C)   Resp 16   Wt 80.7 kg   SpO2 100%   BMI 30.54 kg/m  Gen:   Awake, no distress   Resp:  Normal effort  MSK:   Moves extremities without difficulty  Other:    Medical Decision Making  Medically screening exam initiated at 6:29 PM.  Appropriate orders placed.  Jacqueline [name] was informed that the remainder of the evaluation will be completed by another provider, this initial triage assessment does not replace that evaluation, and the importance of remaining in the ED until their evaluation is complete.    Aimee Houseman, [place] 07/30/23 1836

## 2023-07-30 NOTE — ED Notes (Signed)
 VSS pt sitting in WC with no further complaints. NAD NARD

## 2023-07-31 MED ORDER — KETOROLAC TROMETHAMINE 15 MG/ML IJ SOLN
15.0000 mg | Freq: Once | INTRAMUSCULAR | Status: AC
  Administered 2023-07-31: 15 mg via INTRAVENOUS
  Filled 2023-07-31: qty 1

## 2023-07-31 MED ORDER — METOCLOPRAMIDE HCL 5 MG/ML IJ SOLN
10.0000 mg | Freq: Once | INTRAMUSCULAR | Status: AC
  Administered 2023-07-31: 10 mg via INTRAVENOUS
  Filled 2023-07-31: qty 2

## 2023-07-31 MED ORDER — DIPHENHYDRAMINE HCL 50 MG/ML IJ SOLN
25.0000 mg | Freq: Once | INTRAMUSCULAR | Status: AC
  Administered 2023-07-31: 25 mg via INTRAVENOUS
  Filled 2023-07-31: qty 1

## 2023-07-31 NOTE — ED Provider Notes (Signed)
 [place] EMERGENCY DEPARTMENT AT [facility] Provider Note   CSN: [id number] Arrival date & time: 07/30/23  1739     History  Chief Complaint  Patient presents with   Headache    Jacqueline [name] is a 23 y.o. 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  Patient also has history of antiphospholipid antibody syndrome, immune thrombocytopenic purpura, transaminitis.  Patient presents to ED for evaluation of left-sided headache.  Reports that headache has been present for the last 4 days.  Denies trauma.  Denies history of headaches.  States she has taken Tylenol  at home without for relief.  Reports history of CVA with resulting right-sided deficits but denies any worsening of her deficits beyond her baseline.  She denies any photophobia, visual disturbances, neck pain, lightheadedness or dizziness or weakness beyond baseline.  Reports compliance on blood thinning medication.   Headache      Home Medications Prior to Admission medications   Medication Sig Start Date End Date Taking? Authorizing Provider  acetaminophen  (TYLENOL ) 325 MG tablet Take 2 tablets (650 mg total) by mouth every 4 (four) hours as needed for mild pain (pain score 1-3) (or temp > 37.5 C (99.5 F)). 04/22/23   Angiulli, Everlyn Hockey, PA-C  apixaban  (ELIQUIS ) 2.5 MG TABS tablet Take 1 tablet (2.5 mg total) by mouth 2 (two) times daily. 07/02/23   Frankie Israel, MD  clonazePAM  (KLONOPIN ) 0.5 MG tablet Take 1 tablet (0.5 mg total) by mouth at bedtime. 05/23/23   Jodi Munroe, NP  cyanocobalamin  1000 MCG tablet Take 2 tablets (2,000 mcg total) by mouth daily. 04/15/23   Angiulli, Everlyn Hockey, PA-C  docusate sodium  (COLACE) 100 MG capsule Take 1 capsule (100 mg total) by mouth 3 (three) times daily. Patient not taking: Reported on 05/22/2023 04/23/23   Angiulli, Everlyn Hockey, PA-C  FLUoxetine  (PROZAC ) 10 MG capsule Take 1 capsule by  mouth daily. 05/06/23   [provider]  folic acid  (FOLVITE ) 1 MG tablet Take 1 tablet (1 mg total) by mouth daily. 04/15/23   Angiulli, Everlyn Hockey, PA-C  gabapentin  (NEURONTIN ) 100 MG capsule Take 1 capsule (100 mg total) by mouth 3 (three) times daily. 05/22/23   Jodi Munroe, NP  iron  polysaccharides (NIFEREX) 150 MG capsule Take 1 capsule (150 mg total) by mouth daily. 04/15/23   Angiulli, Everlyn Hockey, PA-C  lidocaine -prilocaine  (EMLA ) cream Apply to affected area once 06/04/23   Kale, Gautam Kishore, MD  Multiple Vitamin (MULTIVITAMIN WITH MINERALS) TABS tablet Take 1 tablet by mouth daily. 04/15/23   Angiulli, Everlyn Hockey, PA-C  naloxone  (NARCAN ) nasal spray 4 mg/0.1 mL Place 1 spray into the nose as needed (opioid overdose). 05/06/23   [provider]  oxyCODONE  (OXY IR/ROXICODONE ) 5 MG immediate release tablet Take 1 tablet (5 mg total) by mouth every 4 (four) hours as needed (perceived pain). 04/15/23   Angiulli, Everlyn Hockey, PA-C  polyethylene glycol (MIRALAX  / GLYCOLAX ) 17 g packet Take 17 g by mouth 2 (two) times daily. Patient not taking: Reported on 05/22/2023 04/15/23   Sterling Eisenmenger, PA-C      Allergies    Patient has no known allergies.    Review of Systems   Review of Systems  Neurological:  Positive for headaches.    Physical Exam Updated Vital Signs BP 111/72 (BP Location: Left Arm)   Pulse 94   Temp 98.7 F (37.1 C)  Resp 18   Wt 80.7 kg   SpO2 96%   BMI 30.54 kg/m  Physical Exam Vitals and nursing note reviewed.  Constitutional:      General: She is not in acute distress.    Appearance: She is well-developed.  HENT:     Head: Normocephalic and atraumatic.  Eyes:     Conjunctiva/sclera: Conjunctivae normal.  Cardiovascular:     Rate and Rhythm: Normal rate and regular rhythm.     Heart sounds: No murmur heard. Pulmonary:     Effort: Pulmonary effort is normal. No respiratory distress.     Breath sounds: Normal breath sounds.  Abdominal:      Palpations: Abdomen is soft.     Tenderness: There is no abdominal tenderness.  Musculoskeletal:        General: No swelling.     Cervical back: Neck supple.  Skin:    General: Skin is warm and dry.     Capillary Refill: Capillary refill takes less than 2 seconds.  Neurological:     General: No focal deficit present.     Mental Status: She is alert and oriented to person, place, and time. Mental status is at baseline.     Sensory: Sensation is intact. No sensory deficit.     Motor: Weakness present.     Comments: Right upper extremity, right lower extremity weakness at baseline per patient.  5 out of 5 strength of left upper extremity, left lower extremity.  Tracks across midline.  Pupils PERRL.  CN III through XII intact.  Psychiatric:        Mood and Affect: Mood normal.     ED Results / Procedures / Treatments   Labs (all labs ordered are listed, but only abnormal results are displayed) Labs Reviewed  CBC WITH DIFFERENTIAL/PLATELET - Abnormal; Notable for the following components:      Result Value   Platelets 112 (*)    All other components within normal limits  BASIC METABOLIC PANEL WITH GFR    EKG None  Radiology CT Head Wo Contrast Result Date: 07/30/2023 CLINICAL DATA:  Headache, increasing frequency or severity EXAM: CT HEAD WITHOUT CONTRAST TECHNIQUE: Contiguous axial images were obtained from the base of the skull through the vertex without intravenous contrast. RADIATION DOSE REDUCTION: This exam was performed according to the departmental dose-optimization program which includes automated exposure control, adjustment of the mA and/or kV according to patient size and/or use of iterative reconstruction technique. COMPARISON:  None Available. FINDINGS: Brain: Redemonstration of left frontoparietal and temporal encephalomalacia. No evidence of large-territorial acute infarction. No parenchymal hemorrhage. No mass lesion. No extra-axial collection. No mass effect or midline  shift. No hydrocephalus. Basilar cisterns are patent. Vascular: No hyperdense vessel. Skull: No acute fracture or focal lesion. Sinuses/Orbits: Paranasal sinuses and mastoid air cells are clear. The orbits are unremarkable. Other: None. IMPRESSION: No acute intracranial abnormality. Electronically Signed   By: Morgane  Naveau M.D.   On: 07/30/2023 19:56    Procedures Procedures    Medications Ordered in ED Medications  acetaminophen  (TYLENOL ) tablet 650 mg (650 mg Oral Given 07/30/23 1853)  metoCLOPramide  (REGLAN ) injection 10 mg (10 mg Intravenous Given 07/31/23 0115)  diphenhydrAMINE  (BENADRYL ) injection 25 mg (25 mg Intravenous Given 07/31/23 0115)  ketorolac  (TORADOL ) 15 MG/ML injection 15 mg (15 mg Intravenous Given 07/31/23 0115)    ED Course/ Medical Decision Making/ A&P  Medical Decision Making Risk Prescription drug management.   23 year old female presents for evaluation.  Please see HPI  for further details.  On exam patient afebrile and nontachycardic.  Her lung sounds are clear bilaterally, she is not hypoxic.  Abdomen soft and compressible.  Neurological examination at baseline per patient.  Overall nontoxic in appearance.  Reassuring vital signs.  Patient lab work initiated in triage include CBC, CMP, CT head, Tylenol .  Patient CBC without leukocytosis or anemia.  Metabolic panel without electrolyte derangement.  CT head unremarkable.  Patient reports that headache still present after Tylenol  administered in triage.  Will provide patient with migraine cocktail to include Toradol , Benadryl  and Reglan .  Will reassess.  Patient reassessed after migraine cocktail and reports that her headache is resolved.  Will discharge patient home at this time.  Will have her follow-up with her PCP.  She was given return precautions and she voiced understanding.  Prior to discharge, did discuss patient with attending Dr. Wallis Gun.  Low concern for other acute causes of headache at this time,  patient compliant on blood thinning medication.  Reports all of her deficits from previous stroke are at baseline without any new focal neurodeficits.  Reports relief of headache with migraine cocktail.  Will discharge patient home at this time.  She was given return precautions and she voiced understanding.  Stable discharge.   Final Clinical Impression(s) / ED Diagnoses Final diagnoses:  Nonintractable headache, unspecified chronicity pattern, unspecified headache type    Rx / DC Orders ED Discharge Orders     None         Kristin Peyer 07/31/23 0226    Eldon Greenland, MD 07/31/23 [phone]

## 2023-07-31 NOTE — Discharge Instructions (Signed)
 It was a pleasure taking part in your care.  As we discussed, your work appears reassuring.  We gave you a migraine cocktail and it relieved your headache.  Please follow-up with your PCP.  Please take ibuprofen  or Tylenol  at home for headaches.  Please return to the ED with any new or worsening symptoms.

## 2023-08-01 ENCOUNTER — Encounter: Payer: Self-pay | Admitting: Hematology

## 2023-08-05 ENCOUNTER — Telehealth: Payer: Self-pay

## 2023-08-05 ENCOUNTER — Encounter: Payer: Self-pay | Admitting: Diagnostic Neuroimaging

## 2023-08-05 ENCOUNTER — Ambulatory Visit (INDEPENDENT_AMBULATORY_CARE_PROVIDER_SITE_OTHER): Payer: Self-pay | Admitting: Diagnostic Neuroimaging

## 2023-08-05 VITALS — BP 116/74 | HR 92 | Ht 64.0 in | Wt 191.0 lb

## 2023-08-05 DIAGNOSIS — I2699 Other pulmonary embolism without acute cor pulmonale: Secondary | ICD-10-CM | POA: Diagnosis not present

## 2023-08-05 DIAGNOSIS — D6862 Lupus anticoagulant syndrome: Secondary | ICD-10-CM

## 2023-08-05 DIAGNOSIS — I63512 Cerebral infarction due to unspecified occlusion or stenosis of left middle cerebral artery: Secondary | ICD-10-CM | POA: Diagnosis not present

## 2023-08-05 MED ORDER — GABAPENTIN 100 MG PO CAPS: 100.0000 mg | ORAL_CAPSULE | Freq: Three times a day (TID) | ORAL | 1 refills | Status: DC

## 2023-08-05 NOTE — Telephone Encounter (Signed)
 Stafford Eagles, ST from [facility] called requesting orders for social worker and increase ST to twice a week. Orders approved and given.

## 2023-08-05 NOTE — Patient Instructions (Addendum)
  POST-STROKE HEADACHE - continue gabapentin  100mg  three times a day per Ford Ide, NP (PM&R clinic)  LUPUS ANTICOAGULANT, PULMONARY EMBOLISM, STROKE - continue apixaban  (eliquis ) per Dr. Jacquelyn Matt (hematology)  PRIMARY CARE - Dr. [name]

## 2023-08-05 NOTE — Progress Notes (Signed)
 [facility]  PATIENT: Jacqueline [name] DOB: April 12, 2000  REFERRING CLINICIAN: Sterling Eisenmenger, PA-C HISTORY FROM: patient REASON FOR VISIT: new consult   HISTORICAL  CHIEF COMPLAINT:  Chief Complaint  Patient presents with   New Patient (Initial Visit)    Rm 6, 03/25/2023-04/23/2023. Pt was in hospital admitted for Left MCA. She went to inpt rehab and was DC. Currently receiving HH PT/OT/ST. They are considering adding in some outpatient therapy. She states in the last week she started to have headaches on the left frontal side. Intermittently comes and go but seems to be worse at bedtime. Pt takes tylenol  to help but doesn't completely knock it out.    Medication Refill    Pt is unsure of what medications she should be on and who should order the medications. She states most of the medications she has either ran out of or is about to run out of and unsure who to contact to fill.     HISTORY OF PRESENT ILLNESS:   UPDATE (08/05/23, VRP): 2 female here for evaluation of hospital stroke follow-up.  Presented to hospital on 02/21/2023 due to expressive aphasia.  She was found to have left M2 MCA occlusion, went to neurointerventional radiology for mechanical thrombectomy but unfortunately this was not successful.  Had extensive left MCA ischemic infarction.  This was felt to be related to underlying hypercoagulability from history of lupus anticoagulant.  Patient was stabilized medically and discharged to inpatient rehab on Eliquis  anticoagulation.  Now patient living back at home with her sister.  Having some issues with left frontal headache, went to the emergency room and CT scan of the head was negative.   REVIEW OF SYSTEMS: Full 14 system review of systems performed and negative with exception of: AS PER hpi.  ALLERGIES: No Known Allergies  HOME MEDICATIONS: Outpatient Medications Prior to Visit  Medication Sig Dispense Refill   acetaminophen  (TYLENOL ) 325 MG  tablet Take 2 tablets (650 mg total) by mouth every 4 (four) hours as needed for mild pain (pain score 1-3) (or temp > 37.5 C (99.5 F)).     apixaban  (ELIQUIS ) 2.5 MG TABS tablet Take 1 tablet (2.5 mg total) by mouth 2 (two) times daily. 60 tablet 1   lidocaine -prilocaine  (EMLA ) cream Apply to affected area once 30 g 3   clonazePAM  (KLONOPIN ) 0.5 MG tablet Take 1 tablet (0.5 mg total) by mouth at bedtime. (Patient not taking: Reported on 08/05/2023) 30 tablet 0   FLUoxetine  (PROZAC ) 10 MG capsule Take 1 capsule by mouth daily. (Patient not taking: Reported on 08/05/2023)     gabapentin  (NEURONTIN ) 100 MG capsule Take 1 capsule (100 mg total) by mouth 3 (three) times daily. (Patient not taking: Reported on 08/05/2023) 90 capsule 1   iron  polysaccharides (NIFEREX) 150 MG capsule Take 1 capsule (150 mg total) by mouth daily. (Patient not taking: Reported on 08/05/2023) 30 capsule 0   naloxone  (NARCAN ) nasal spray 4 mg/0.1 mL Place 1 spray into the nose as needed (opioid overdose). (Patient not taking: Reported on 08/05/2023)     cyanocobalamin  1000 MCG tablet Take 2 tablets (2,000 mcg total) by mouth daily. 60 tablet 0   docusate sodium  (COLACE) 100 MG capsule Take 1 capsule (100 mg total) by mouth 3 (three) times daily. (Patient not taking: Reported on 05/22/2023)     folic acid  (FOLVITE ) 1 MG tablet Take 1 tablet (1 mg total) by mouth daily. 30 tablet 0   Multiple Vitamin (MULTIVITAMIN WITH MINERALS)  TABS tablet Take 1 tablet by mouth daily.     oxyCODONE  (OXY IR/ROXICODONE ) 5 MG immediate release tablet Take 1 tablet (5 mg total) by mouth every 4 (four) hours as needed (perceived pain). 30 tablet 0   polyethylene glycol (MIRALAX  / GLYCOLAX ) 17 g packet Take 17 g by mouth 2 (two) times daily. (Patient not taking: Reported on 05/22/2023)     No facility-administered medications prior to visit.    PAST MEDICAL HISTORY: Past Medical History:  Diagnosis Date   Bacterial vaginitis 07/10/2016   Candida vaginitis  07/24/2016   Headache in pregnancy, antepartum, third trimester 07/24/2016   Kell isoimmunization during pregnancy    Stroke [facility])     PAST SURGICAL HISTORY: Past Surgical History:  Procedure Laterality Date   CESAREAN SECTION N/A 09/04/2016   Procedure: CESAREAN SECTION;  Surgeon: Malka Sea, DO;  Location: [facility];  Service: Obstetrics;  Laterality: N/A;   CESAREAN SECTION Bilateral    IR CT HEAD LTD  02/21/2023   IR GASTROSTOMY TUBE MOD SED  03/21/2023   IR GASTROSTOMY TUBE REMOVAL  05/14/2023   IR PERCUTANEOUS ART THROMBECTOMY/INFUSION INTRACRANIAL INC DIAG ANGIO  02/21/2023   IR REPLC GASTRO/COLONIC TUBE PERCUT W/FLUORO  03/27/2023   RADIOLOGY WITH ANESTHESIA N/A 02/21/2023   Procedure: IR WITH ANESTHESIA;  Surgeon: Luellen Sages, MD;  Location: [facility] OR;  Service: Radiology;  Laterality: N/A;    FAMILY HISTORY: Family History  Problem Relation Age of Onset   Hypertension Maternal Grandmother    Diabetes Maternal Grandmother    Cancer Neg Hx     SOCIAL HISTORY: Social History   Socioeconomic History   Marital status: Single    Spouse name: Not on file   Number of children: Not on file   Years of education: Not on file   Highest education level: Not on file  Occupational History   Not on file  Tobacco Use   Smoking status: Former    Types: Cigars   Smokeless tobacco: Never   Tobacco comments:    1 B&M per day  Vaping Use   Vaping status: Never Used  Substance and Sexual Activity   Alcohol use: Not Currently   Drug use: No   Sexual activity: Yes    Partners: Male    Birth control/protection: None  Other Topics Concern   Not on file  Social History Narrative   [phone]- patient's confidential cell   Social Drivers of Health   Financial Resource Strain: Not on File (06/21/2021)   Received from [employer] [employer]    Financial Resource Strain: 0  Food Insecurity: No Food Insecurity (05/07/2023)   Hunger Vital Sign     Worried About Running Out of Food in the Last Year: Never true    Ran Out of Food in the Last Year: Never true  Transportation Needs: No Transportation Needs (05/07/2023)   PRAPARE - Administrator, Civil Service (Medical): No    Lack of Transportation (Non-Medical): No  Physical Activity: Not on File (06/21/2021)   Received from [place], [place]   Physical Activity    Physical Activity: 0  Stress: Not on File (06/21/2021)   Received from [facility], [place]   Stress    Stress: 0  Social Connections: Not on File (11/16/2022)   Received from [facility]   Social Connections    Connectedness: 0  Intimate Partner Violence: Patient Declined (05/07/2023)   Humiliation, Afraid, Rape, and Kick questionnaire  Fear of Current or Ex-Partner: Patient declined    Emotionally Abused: Patient declined    Physically Abused: Patient declined    Sexually Abused: Patient declined     PHYSICAL EXAM  GENERAL EXAM/CONSTITUTIONAL: Vitals:  Vitals:   08/05/23 0936  BP: 116/74  Pulse: 92  Weight: 191 lb (86.6 kg)  Height: 5\' 4"  (1.626 m)   Body mass index is 32.79 kg/m. Wt Readings from Last 3 Encounters:  08/05/23 191 lb (86.6 kg)  07/30/23 177 lb 14.6 oz (80.7 kg)  07/11/23 178 lb (80.7 kg)   Patient is in no distress; well developed, nourished and groomed; neck is supple  CARDIOVASCULAR: Examination of carotid arteries is normal; no carotid bruits Regular rate and rhythm, no murmurs Examination of peripheral vascular system by observation and palpation is normal  EYES: Ophthalmoscopic exam of optic discs and posterior segments is normal; no papilledema or hemorrhages No results found.  MUSCULOSKELETAL: Gait, strength, tone, movements noted in Neurologic exam below  NEUROLOGIC: MENTAL STATUS:      No data to display         awake, alert, oriented to person, place and time recent and remote memory intact normal attention and concentration language fluent, comprehension intact,  naming intact fund of knowledge appropriate  CRANIAL NERVE:  2nd - no papilledema on fundoscopic exam 2nd, 3rd, 4th, 6th - pupils equal and reactive to light, visual fields full to confrontation, extraocular muscles intact, no nystagmus 5th - facial sensation symmetric 7th - facial strength symmetric 8th - hearing intact 9th - palate elevates symmetrically, uvula midline 11th - shoulder shrug symmetric 12th - tongue protrusion midline  MOTOR:  normal bulk and tone, full strength in the LUE, LLE RUE 2-3 PROX, 0 DISTAL (WITH BRACE); SLIGHT INCREASED TONE RLE 2-3 PROX, 0 DISTAL (WITH BRACE); SLIGHT INCREASE TONE  SENSORY:  normal and symmetric to light touch, temperature, vibration  COORDINATION:  finger-nose-finger, fine finger movements normal  REFLEXES:  deep tendon reflexes brisk on RUE and RLE  GAIT/STATION:  narrow based gait     DIAGNOSTIC DATA (LABS, IMAGING, TESTING) - I reviewed patient records, labs, notes, testing and imaging myself where available.  Lab Results  Component Value Date   WBC 5.7 07/30/2023   HGB 13.1 07/30/2023   HCT 38.0 07/30/2023   MCV 82.6 07/30/2023   PLT 112 (L) 07/30/2023      Component Value Date/Time   NA 138 07/30/2023 2025   K 4.2 07/30/2023 2025   CL 104 07/30/2023 2025   CO2 25 07/30/2023 2025   GLUCOSE 79 07/30/2023 2025   BUN 12 07/30/2023 2025   CREATININE 0.80 07/30/2023 2025   CREATININE 0.95 07/11/2023 0856   CALCIUM 9.1 07/30/2023 2025   PROT 7.0 07/11/2023 0856   ALBUMIN 3.9 07/11/2023 0856   AST 20 07/11/2023 0856   ALT 26 07/11/2023 0856   ALKPHOS 71 07/11/2023 0856   BILITOT 0.3 07/11/2023 0856   GFRNONAA >60 07/30/2023 2025   GFRNONAA >60 07/11/2023 0856   GFRAA >60 12/06/2016 0200   Lab Results  Component Value Date   CHOL 93 02/22/2023   HDL 32 (L) 02/22/2023   LDLCALC 42 02/22/2023   TRIG 97 02/22/2023   CHOLHDL 2.9 02/22/2023   Lab Results  Component Value Date   HGBA1C 4.4 (L) 02/21/2023    Lab Results  Component Value Date   VITAMINB12 1,198 (H) 05/06/2023   No results found for: "TSH"    ASSESSMENT AND PLAN  22 y.o.  year old female here with:   Dx:  1. Left middle cerebral artery stroke (HCC)   2. Lupus anticoagulant disorder (HCC)   3. Pulmonary embolism, other, unspecified chronicity, unspecified whether acute cor pulmonale present (HCC)     PLAN:  Stroke (02/22/23):  left MCA territory infarct s/p unsuccessful IR with extension, etiology: likely related to hypercoagulability from positive lupus anticoagulant was not on [facility] Code Stroke CT head left parietotemporal \\infarct  ASPECTS 7-8 CTA head & neck occluded left proximal to mid M2 MCA CT perfusion 21 mm penumbra in left parietotemporal region S/p IR - initially TICI3, but progressive worsening stenosis with subsequent complete closure of left M2. Intermittent mild improvement in cailber with verapamil  and nitroglycerine.  MRI acute left MCA territory infarct without hemorrhagic conversion CT repeat likely stroke extension in the anterior inferior right frontal lobe and right cerebellum MRI repeat showed stroke extension but no hemorrhagic conversion. MLS mild < 3mm.  CT repeat 12/28 showed stable large right MCA and small left MCA/ACA infarcts. MLS 2-28mm CT repeat 12/30 expected evolutionary changes in the left posterior division left MCA and left ACA/MCA border zone infarcts.  2D Echo EF 60 to 65%.  Left atrium size normal. 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 Pallidum Ab negative Follow up with PCP   BP management Home meds: None BP stable now   Lipid management Home meds: None LDL 42, goal < 70 High intensity statin not indicated as LDL below goal   Substance Abuse Avoid Suboxone, tobacco, alcohol (currently abstinent)  Return for return to PCP, pending if symptoms worsen or fail to improve.  I reviewed images, labs, notes, records myself. I summarized findings and reviewed with patient, for this high risk condition (stroke) requiring high complexity decision making.    Omega Bible, MD 08/05/2023, 10:28 AM Certified in Neurology, Neurophysiology and Neuroimaging  [facility] [address], [address] [place], [place] [address] [phone]

## 2023-08-20 NOTE — Progress Notes (Signed)
 Attempted to call pt's sister to get pt scheduled. [name] called last week. Have tried several times to connect with sister, left messages and unable to reach.

## 2023-08-25 ENCOUNTER — Telehealth: Payer: Self-pay

## 2023-08-25 NOTE — Telephone Encounter (Signed)
 Jerel, PT/[facility] calling for verbal orders for HHPT 1wk5. Orders approved and given.

## 2023-08-26 ENCOUNTER — Telehealth: Payer: Self-pay

## 2023-08-26 NOTE — Telephone Encounter (Signed)
-  Verbal okay given for speech therapy one a week of one week, then twice a week for 2 weeks. [facility] [phone]). SABRA

## 2023-08-29 ENCOUNTER — Encounter (HOSPITAL_COMMUNITY): Payer: Self-pay | Admitting: Interventional Radiology

## 2023-09-09 ENCOUNTER — Encounter (HOSPITAL_COMMUNITY): Payer: Self-pay

## 2023-09-09 ENCOUNTER — Inpatient Hospital Stay (HOSPITAL_COMMUNITY)
Admission: EM | Admit: 2023-09-09 | Discharge: 2023-09-16 | DRG: 176 | Disposition: A | Discharge status: Home / Self Care | Attending: Internal Medicine | Admitting: Internal Medicine

## 2023-09-09 ENCOUNTER — Other Ambulatory Visit: Payer: Self-pay

## 2023-09-09 DIAGNOSIS — I2693 Single subsegmental pulmonary embolism without acute cor pulmonale: Principal | ICD-10-CM | POA: Diagnosis present

## 2023-09-09 DIAGNOSIS — E66811 Obesity, class 1: Secondary | ICD-10-CM | POA: Diagnosis present

## 2023-09-09 DIAGNOSIS — Z8249 Family history of ischemic heart disease and other diseases of the circulatory system: Secondary | ICD-10-CM

## 2023-09-09 DIAGNOSIS — Z87891 Personal history of nicotine dependence: Secondary | ICD-10-CM

## 2023-09-09 DIAGNOSIS — Z833 Family history of diabetes mellitus: Secondary | ICD-10-CM

## 2023-09-09 DIAGNOSIS — Z79899 Other long term (current) drug therapy: Secondary | ICD-10-CM

## 2023-09-09 DIAGNOSIS — I2609 Other pulmonary embolism with acute cor pulmonale: Secondary | ICD-10-CM

## 2023-09-09 DIAGNOSIS — I2489 Other forms of acute ischemic heart disease: Secondary | ICD-10-CM | POA: Diagnosis present

## 2023-09-09 DIAGNOSIS — Z6832 Body mass index (BMI) 32.0-32.9, adult: Secondary | ICD-10-CM

## 2023-09-09 DIAGNOSIS — I69351 Hemiplegia and hemiparesis following cerebral infarction affecting right dominant side: Secondary | ICD-10-CM

## 2023-09-09 DIAGNOSIS — Z5900 Homelessness unspecified: Secondary | ICD-10-CM

## 2023-09-09 DIAGNOSIS — Z8673 Personal history of transient ischemic attack (TIA), and cerebral infarction without residual deficits: Secondary | ICD-10-CM

## 2023-09-09 DIAGNOSIS — I071 Rheumatic tricuspid insufficiency: Secondary | ICD-10-CM | POA: Diagnosis present

## 2023-09-09 DIAGNOSIS — I2782 Chronic pulmonary embolism: Secondary | ICD-10-CM | POA: Diagnosis present

## 2023-09-09 DIAGNOSIS — G43909 Migraine, unspecified, not intractable, without status migrainosus: Secondary | ICD-10-CM | POA: Diagnosis present

## 2023-09-09 DIAGNOSIS — I69320 Aphasia following cerebral infarction: Secondary | ICD-10-CM

## 2023-09-09 DIAGNOSIS — I63512 Cerebral infarction due to unspecified occlusion or stenosis of left middle cerebral artery: Secondary | ICD-10-CM | POA: Diagnosis present

## 2023-09-09 DIAGNOSIS — D6861 Antiphospholipid syndrome: Secondary | ICD-10-CM | POA: Diagnosis present

## 2023-09-09 DIAGNOSIS — I6340 Cerebral infarction due to embolism of unspecified cerebral artery: Secondary | ICD-10-CM

## 2023-09-09 DIAGNOSIS — Z91148 Patient's other noncompliance with medication regimen for other reason: Secondary | ICD-10-CM

## 2023-09-09 DIAGNOSIS — N179 Acute kidney failure, unspecified: Secondary | ICD-10-CM | POA: Diagnosis present

## 2023-09-09 DIAGNOSIS — Z7901 Long term (current) use of anticoagulants: Secondary | ICD-10-CM

## 2023-09-09 DIAGNOSIS — D696 Thrombocytopenia, unspecified: Secondary | ICD-10-CM | POA: Diagnosis present

## 2023-09-09 DIAGNOSIS — I2699 Other pulmonary embolism without acute cor pulmonale: Principal | ICD-10-CM | POA: Diagnosis present

## 2023-09-09 DIAGNOSIS — R7989 Other specified abnormal findings of blood chemistry: Principal | ICD-10-CM | POA: Diagnosis present

## 2023-09-09 DIAGNOSIS — F419 Anxiety disorder, unspecified: Secondary | ICD-10-CM | POA: Diagnosis present

## 2023-09-09 LAB — COMPREHENSIVE METABOLIC PANEL WITH GFR
ALT: 13 U/L (ref 0–44)
AST: 24 U/L (ref 15–41)
Albumin: 3.7 g/dL (ref 3.5–5.0)
Alkaline Phosphatase: 67 U/L (ref 38–126)
Anion gap: 10 (ref 5–15)
BUN: 12 mg/dL (ref 6–20)
CO2: 19 mmol/L — ABNORMAL LOW (ref 22–32)
Calcium: 8.8 mg/dL — ABNORMAL LOW (ref 8.9–10.3)
Chloride: 107 mmol/L (ref 98–111)
Creatinine, Ser: 0.82 mg/dL (ref 0.44–1.00)
GFR, Estimated: >60 mL/min (ref >60)
Glucose, Bld: 93 mg/dL (ref 70–99)
Potassium: 3.7 mmol/L (ref 3.5–5.1)
Sodium: 136 mmol/L (ref 135–145)
Total Bilirubin: 0.9 mg/dL (ref 0.0–1.2)
Total Protein: 6.8 g/dL (ref 6.5–8.1)

## 2023-09-09 LAB — CBC
HCT: 35.9 % — ABNORMAL LOW (ref 36.0–46.0)
Hemoglobin: 12.5 g/dL (ref 12.0–15.0)
MCH: 29.4 pg (ref 26.0–34.0)
MCHC: 34.8 g/dL (ref 30.0–36.0)
MCV: 84.5 fL (ref 80.0–100.0)
Platelets: 108 K/uL — ABNORMAL LOW (ref 150–400)
RBC: 4.25 MIL/uL (ref 3.87–5.11)
RDW: 13.3 % (ref 11.5–15.5)
WBC: 4.5 K/uL (ref 4.0–10.5)
nRBC: 0 % (ref 0.0–0.2)

## 2023-09-09 LAB — HCG, SERUM, QUALITATIVE: Preg, Serum: NEGATIVE

## 2023-09-09 LAB — CBG MONITORING, ED: Glucose-Capillary: 111 mg/dL — ABNORMAL HIGH (ref 70–99)

## 2023-09-09 NOTE — ED Triage Notes (Addendum)
 Patient had a stroke in September of last year and has been feeling really weak since. Sister said that she feels that she left rehab too early. Pt uses a walker to get around. But she does not move as much as she should. Would like to be checked out and maybe go back to rehab. Patient denies any new symptoms.

## 2023-09-09 NOTE — ED Notes (Signed)
 Pt sister Charlott would like to be contacted on pt progress at [phone]

## 2023-09-10 ENCOUNTER — Encounter (HOSPITAL_COMMUNITY): Payer: Self-pay

## 2023-09-10 ENCOUNTER — Observation Stay (HOSPITAL_COMMUNITY)

## 2023-09-10 ENCOUNTER — Emergency Department (HOSPITAL_COMMUNITY)

## 2023-09-10 DIAGNOSIS — I2693 Single subsegmental pulmonary embolism without acute cor pulmonale: Secondary | ICD-10-CM

## 2023-09-10 DIAGNOSIS — E66811 Obesity, class 1: Secondary | ICD-10-CM | POA: Diagnosis present

## 2023-09-10 DIAGNOSIS — I2699 Other pulmonary embolism without acute cor pulmonale: Secondary | ICD-10-CM

## 2023-09-10 DIAGNOSIS — I2694 Multiple subsegmental pulmonary emboli without acute cor pulmonale: Secondary | ICD-10-CM | POA: Diagnosis not present

## 2023-09-10 DIAGNOSIS — R7989 Other specified abnormal findings of blood chemistry: Secondary | ICD-10-CM | POA: Diagnosis present

## 2023-09-10 HISTORY — DX: Other pulmonary embolism without acute cor pulmonale: I26.99

## 2023-09-10 LAB — TROPONIN I (HIGH SENSITIVITY)
Troponin I (High Sensitivity): 35 ng/L — ABNORMAL HIGH (ref <18)
Troponin I (High Sensitivity): 31 ng/L — ABNORMAL HIGH (ref <18)

## 2023-09-10 LAB — ECHOCARDIOGRAM COMPLETE
AR max vel: 3.04 cm2
AV Area VTI: 3.06 cm2
AV Area mean vel: 2.94 cm2
AV Mean grad: 3 mmHg
AV Peak grad: 5.3 mmHg
Ao pk vel: 1.15 m/s
Height: 64 in
MV M vel: 3.46 m/s
MV Peak grad: 47.9 mmHg
S' Lateral: 2.9 cm
Weight: 3072 [oz_av]

## 2023-09-10 LAB — URINALYSIS, ROUTINE W REFLEX MICROSCOPIC
Bacteria, UA: NONE SEEN
Bilirubin Urine: NEGATIVE
Glucose, UA: NEGATIVE mg/dL
Ketones, ur: NEGATIVE mg/dL
Leukocytes,Ua: NEGATIVE
Nitrite: NEGATIVE
Protein, ur: 100 mg/dL — AB
RBC / HPF: >50 RBC/hpf (ref 0–5)
Specific Gravity, Urine: 1.011 (ref 1.005–1.030)
pH: 6 (ref 5.0–8.0)

## 2023-09-10 LAB — LIPASE, BLOOD: Lipase: 38 U/L (ref 11–51)

## 2023-09-10 MED ORDER — DIPHENHYDRAMINE HCL 50 MG/ML IJ SOLN
12.5000 mg | Freq: Once | INTRAMUSCULAR | Status: AC
  Administered 2023-09-10: 12.5 mg via INTRAVENOUS
  Filled 2023-09-10: qty 1

## 2023-09-10 MED ORDER — PROCHLORPERAZINE EDISYLATE 10 MG/2ML IJ SOLN
10.0000 mg | Freq: Once | INTRAMUSCULAR | Status: AC
  Administered 2023-09-10: 10 mg via INTRAVENOUS
  Filled 2023-09-10: qty 2

## 2023-09-10 MED ORDER — APIXABAN 5 MG PO TABS: 5.0000 mg | ORAL_TABLET | Freq: Two times a day (BID) | ORAL | Status: DC

## 2023-09-10 MED ORDER — ONDANSETRON HCL 4 MG/2ML IJ SOLN
4.0000 mg | Freq: Four times a day (QID) | INTRAMUSCULAR | Status: DC | PRN
  Administered 2023-09-15 (×3): 4 mg via INTRAVENOUS
  Filled 2023-09-10 (×4): qty 2

## 2023-09-10 MED ORDER — IOHEXOL 350 MG/ML SOLN
100.0000 mL | Freq: Once | INTRAVENOUS | Status: AC | PRN
  Administered 2023-09-10: 100 mL via INTRAVENOUS

## 2023-09-10 MED ORDER — ACETAMINOPHEN 650 MG RE SUPP: 650.0000 mg | Freq: Four times a day (QID) | RECTAL | Status: DC | PRN

## 2023-09-10 MED ORDER — METOCLOPRAMIDE HCL 5 MG/ML IJ SOLN
10.0000 mg | Freq: Once | INTRAMUSCULAR | Status: AC
  Administered 2023-09-10: 10 mg via INTRAVENOUS
  Filled 2023-09-10: qty 2

## 2023-09-10 MED ORDER — ACETAMINOPHEN 325 MG PO TABS
650.0000 mg | ORAL_TABLET | Freq: Four times a day (QID) | ORAL | Status: DC | PRN
  Administered 2023-09-10 – 2023-09-16 (×10): 650 mg via ORAL
  Filled 2023-09-10 (×10): qty 2

## 2023-09-10 MED ORDER — APIXABAN 5 MG PO TABS
10.0000 mg | ORAL_TABLET | Freq: Two times a day (BID) | ORAL | Status: DC
  Administered 2023-09-10 – 2023-09-16 (×13): 10 mg via ORAL
  Filled 2023-09-10 (×4): qty 2
  Filled 2023-09-10: qty 4
  Filled 2023-09-10 (×8): qty 2

## 2023-09-10 MED ORDER — ONDANSETRON HCL 4 MG PO TABS: 4.0000 mg | ORAL_TABLET | Freq: Four times a day (QID) | ORAL | Status: DC | PRN

## 2023-09-10 MED ORDER — MORPHINE SULFATE (PF) 2 MG/ML IV SOLN
2.0000 mg | INTRAVENOUS | Status: AC | PRN
  Administered 2023-09-10 – 2023-09-11 (×3): 2 mg via INTRAVENOUS
  Filled 2023-09-10 (×3): qty 1

## 2023-09-10 MED ORDER — GABAPENTIN 100 MG PO CAPS
100.0000 mg | ORAL_CAPSULE | Freq: Once | ORAL | Status: AC
  Administered 2023-09-10: 100 mg via ORAL
  Filled 2023-09-10: qty 1

## 2023-09-10 NOTE — Discharge Instructions (Signed)
 Information on my medicine - ELIQUIS  (apixaban )  This medication education was reviewed with me or my healthcare representative as part of my discharge preparation.   Why was Eliquis  prescribed for you? Eliquis  was prescribed to treat blood clots that may have been found in the veins of your legs (deep vein thrombosis) or in your lungs (pulmonary embolism) and to reduce the risk of them occurring again.  What do You need to know about Eliquis  ? The starting dose is 10 mg (two 5 mg tablets) taken TWICE daily for the FIRST SEVEN (7) DAYS, then on 09/17/23  the dose is reduced to ONE 5 mg tablet taken TWICE daily.  Eliquis  may be taken with or without food.   Try to take the dose about the same time in the morning and in the evening. If you have difficulty swallowing the tablet whole please discuss with your pharmacist how to take the medication safely.  Take Eliquis  exactly as prescribed and DO NOT stop taking Eliquis  without talking to the doctor who prescribed the medication.  Stopping may increase your risk of developing a new blood clot.  Refill your prescription before you run out.  After discharge, you should have regular check-up appointments with your healthcare provider that is prescribing your Eliquis .    What do you do if you miss a dose? If a dose of ELIQUIS  is not taken at the scheduled time, take it as soon as possible on the same day and twice-daily administration should be resumed. The dose should not be doubled to make up for a missed dose.  Important Safety Information A possible side effect of Eliquis  is bleeding. You should call your healthcare provider right away if you experience any of the following: Bleeding from an injury or your nose that does not stop. Unusual colored urine (red or dark brown) or unusual colored stools (red or black). Unusual bruising for unknown reasons. A serious fall or if you hit your head (even if there is no bleeding).  Some medicines  may interact with Eliquis  and might increase your risk of bleeding or clotting while on Eliquis . To help avoid this, consult your healthcare provider or pharmacist prior to using any new prescription or non-prescription medications, including herbals, vitamins, non-steroidal anti-inflammatory drugs (NSAIDs) and supplements.  This website has more information on Eliquis  (apixaban ): [email or web address] .[email or web address] dena

## 2023-09-10 NOTE — Plan of Care (Signed)

## 2023-09-10 NOTE — ED Notes (Signed)
 ED TO INPATIENT HANDOFF REPORT  Name/Age/Gender Jacqueline [name] 23 y.o. female  Code Status    Code Status Orders  (From admission, onward)           Start     Ordered   09/10/23 0757  Full code  Continuous       Question:  By:  Answer:  Consent: discussion documented in EHR   09/10/23 0759           Code Status History     Date Active Date Inactive Code Status Order ID Comments User Context   05/06/2023 2212 05/15/2023 1830 Full Code [id number]  [name], MD ED   03/25/2023 1451 04/23/2023 1521 Full Code [id number]  [name], PA-C Inpatient   03/25/2023 1451 03/25/2023 1451 Full Code [id number]  [name], PA-C Inpatient   02/21/2023 1206 03/25/2023 1438 Full Code [id number]  [name] HERO, MD ED   04/08/2022 0636 04/14/2022 1924 Full Code [id number]  [name], [name] LABOR, MD Inpatient   08/04/2017 0311 08/05/2017 2034 Full Code [id number]  [name] NOVAK, MD Inpatient   08/03/2017 1755 08/04/2017 0311 Full Code [id number]  [name], DO Inpatient   09/04/2016 0722 09/07/2016 1544 Full Code [id number]  [name], DO Inpatient   09/02/2016 0750 09/04/2016 0713 Full Code [id number]  [name] Inpatient       Home/SNF/Other Home  Chief Complaint Pulmonary emboli [facility]) [I26.99]  Level of Care/Admitting Diagnosis ED Disposition     ED Disposition  Admit   Condition  --   Comment  Hospital Area: [facility] [[id number]]  Level of Care: Telemetry [5]  Admit to tele based on following criteria: Monitor for Ischemic changes  May place patient in observation at [facility] or [facility] if equivalent level of care is available:: No  Covid Evaluation: Asymptomatic - no recent exposure (last 10 days) testing not required  Diagnosis: Pulmonary emboli [facility]) [[id number]]  Admitting Physician: CELINDA ALM LOT [[id number]]  Attending Physician: CELINDA ALM LOT [phone]  For patients discharging to extended facilities (i.e. SNF, AL, group  homes or LTAC) initiate:: Discharge to SNF/Facility Placement COVID-19 Lab Testing Protocol          Medical History Past Medical History:  Diagnosis Date   Bacterial vaginitis 07/10/2016   Candida vaginitis 07/24/2016   Headache in pregnancy, antepartum, third trimester 07/24/2016   Kell isoimmunization during pregnancy    Stroke [facility])     Allergies No Known Allergies  IV Location/Drains/Wounds Patient Lines/Drains/Airways Status     Active Line/Drains/Airways     Name Placement date Placement time Site Days   Peripheral IV 09/10/23 20 G Left Antecubital 09/10/23  0103  Antecubital  less than 1            Labs/Imaging Results for orders placed or performed during the hospital encounter of 09/09/23 (from the past 48 hours)  Comprehensive metabolic panel     Status: Abnormal   Collection Time: 09/09/23  8:22 PM  Result Value Ref Range   Sodium 136 135 - 145 mmol/L   Potassium 3.7 3.5 - 5.1 mmol/L   Chloride 107 98 - 111 mmol/L   CO2 19 (L) 22 - 32 mmol/L   Glucose, Bld 93 70 - 99 mg/dL    Comment: Glucose reference range applies only to samples taken after fasting for at least 8 hours.   BUN 12 6 - 20 mg/dL   Creatinine, Ser 9.17 0.44 - 1.00  mg/dL   Calcium 8.8 (L) 8.9 - 10.3 mg/dL   Total Protein 6.8 6.5 - 8.1 g/dL   Albumin 3.7 3.5 - 5.0 g/dL   AST 24 15 - 41 U/L   ALT 13 0 - 44 U/L   Alkaline Phosphatase 67 38 - 126 U/L   Total Bilirubin 0.9 0.0 - 1.2 mg/dL   GFR, Estimated >39 >39 mL/min    Comment: (NOTE) Calculated using the CKD-EPI Creatinine Equation (2021)    Anion gap 10 5 - 15    Comment: Performed at [facility], [address] [place], [place] [address]  CBC     Status: Abnormal   Collection Time: 09/09/23  8:22 PM  Result Value Ref Range   WBC 4.5 4.0 - 10.5 K/uL   RBC 4.25 3.87 - 5.11 MIL/uL   Hemoglobin 12.5 12.0 - 15.0 g/dL   HCT 64.0 (L) 63.9 - 53.9 %   MCV 84.5 80.0 - 100.0 fL   MCH 29.4 26.0 - 34.0 pg   MCHC  34.8 30.0 - 36.0 g/dL   RDW 86.6 88.4 - 84.4 %   Platelets 108 (L) 150 - 400 K/uL   nRBC 0.0 0.0 - 0.2 %    Comment: Performed at [facility], [address], [place], [place] [address]  hCG, serum, qualitative     Status: None   Collection Time: 09/09/23  8:22 PM  Result Value Ref Range   Preg, Serum NEGATIVE NEGATIVE    Comment:        THE SENSITIVITY OF THIS METHODOLOGY IS >10 mIU/mL. Performed at [facility], [address], [place], [place] [address]   Urinalysis, Routine w reflex microscopic -Urine, Clean Catch     Status: Abnormal   Collection Time: 09/09/23  8:38 PM  Result Value Ref Range   Color, Urine RED (A) YELLOW    Comment: BIOCHEMICALS MAY BE AFFECTED BY COLOR   APPearance CLOUDY (A) CLEAR   Specific Gravity, Urine 1.011 1.005 - 1.030   pH 6.0 5.0 - 8.0   Glucose, UA NEGATIVE NEGATIVE mg/dL   Hgb urine dipstick MODERATE (A) NEGATIVE   Bilirubin Urine NEGATIVE NEGATIVE   Ketones, ur NEGATIVE NEGATIVE mg/dL   Protein, ur 899 (A) NEGATIVE mg/dL   Nitrite NEGATIVE NEGATIVE   Leukocytes,Ua NEGATIVE NEGATIVE   RBC / HPF >50 0 - 5 RBC/hpf   WBC, UA 0-5 0 - 5 WBC/hpf   Bacteria, UA NONE SEEN NONE SEEN   Squamous Epithelial / HPF 0-5 0 - 5 /HPF    Comment: Performed at [facility], [address], [place], [place] [address]  CBG monitoring, ED     Status: Abnormal   Collection Time: 09/09/23 11:12 PM  Result Value Ref Range   Glucose-Capillary 111 (H) 70 - 99 mg/dL    Comment: Glucose reference range applies only to samples taken after fasting for at least 8 hours.  Lipase, blood     Status: None   Collection Time: 09/10/23  1:04 AM  Result Value Ref Range   Lipase 38 11 - 51 U/L    Comment: Performed at [facility], [address], [place], [place] [address]  Troponin I (High Sensitivity)     Status: Abnormal   Collection Time: 09/10/23  1:04 AM  Result Value Ref Range   Troponin I  (High Sensitivity) 35 (H) <18 ng/L    Comment: (NOTE) Elevated high sensitivity troponin I (hsTnI)  values and significant  changes across serial measurements may suggest ACS but many other  chronic and acute conditions are known to elevate hsTnI results.  Refer to the Links section for chest pain algorithms and additional  guidance. Performed at [facility], [address], [place], [place] [address]   Troponin I (High Sensitivity)     Status: Abnormal   Collection Time: 09/10/23  2:56 AM  Result Value Ref Range   Troponin I (High Sensitivity) 31 (H) <18 ng/L    Comment: (NOTE) Elevated high sensitivity troponin I (hsTnI) values and significant  changes across serial measurements may suggest ACS but many other  chronic and acute conditions are known to elevate hsTnI results.  Refer to the Links section for chest pain algorithms and additional  guidance. Performed at [facility], [address] [place], [place] [address]    CT Angio Chest PE W/Cm &/Or Wo Cm Result Date: 09/10/2023 CLINICAL DATA:  On 02/22/2023 the patient had a large left hemispheric MCA stroke, with weakness since then. Patient is only intermittently mobile with high risk for pulmonary embolism and history of right lower lobe PE in February 2024. She also presents with left lower quadrant abdominal pain. EXAM: CT ANGIOGRAPHY CHEST CT ABDOMEN AND PELVIS WITH CONTRAST TECHNIQUE: Multidetector CT imaging of the chest was performed using the standard protocol during bolus administration of intravenous contrast. Multiplanar CT image reconstructions and MIPs were obtained to evaluate the vascular anatomy. Multidetector CT imaging of the abdomen and pelvis was performed using the standard protocol during bolus administration of intravenous contrast. RADIATION DOSE REDUCTION: This exam was performed according to the departmental dose-optimization program which includes automated exposure  control, adjustment of the mA and/or kV according to patient size and/or use of iterative reconstruction technique. CONTRAST:  OMNIPAQUE  IOHEXOL  350 MG/ML SOLN COMPARISON:  CTA chest studies 04/08/2022, 04/13/2022 and 11/03/2021, CT abdomen contrast 03/26/2023. No prior pelvic CT. FINDINGS: CTA CHEST FINDINGS Cardiovascular: The pulmonary trunk is upper limit normal in caliber, unchanged. Mild cardiomegaly. The pulmonary veins are nondistended. There is a right lower lobe posterior basilar branch point subsegmental embolus on series 4 axial 65-67. Downstream from this multiple small posterior basal distal branches are unopacified compared to the left and probably also harbor  thrombus, but this was seen on the studies from February 2024 and probably chronic. Remainder of the pulmonary arteries appear free of thrombus. There were several lateral basal segmental and subsegmental emboli on the prior studies which are no longer seen. There is a small chronic anterior pericardial effusion. The aorta and great vessels are normal. Mediastinum/Nodes: No enlarged mediastinal, hilar, or axillary lymph nodes. Thyroid gland, trachea, and esophagus demonstrate no significant findings. Lungs/Pleura: Diffuse bronchial thickening. There is mild posterior atelectasis. Consolidation change previously in the right lower lobe basal segments has resolved. 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 There is no consolidation, effusion, pneumothorax or nodules. Musculoskeletal: No chest wall abnormality. No acute or significant osseous findings. Review of the MIP images confirms the above findings. CT ABDOMEN and PELVIS FINDINGS Hepatobiliary: No focal liver abnormality is seen. No gallstones, gallbladder wall thickening, or biliary dilatation. Pancreas: No abnormality. Spleen: No abnormality. 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 No  adrenal or renal mass enhancement. No urinary stone or obstruction. Unremarkable bladder for the degree of distension. Stomach/Bowel: Since 03/26/2023, interval PEG tube Removal. No secondary complication is seen. No GI  tract dilatation or wall thickening. Normal appendix. Moderate retained stool ascending colon. No evidence of colitis or diverticulitis. Vascular/Lymphatic: No significant vascular findings are present. No enlarged abdominal or pelvic lymph nodes. Reproductive: Uterus and bilateral adnexa are unremarkable. Other: Mild rectus diastasis at the umbilicus and small umbilical fat hernia. No incarcerated hernia. No free fluid, free hemorrhage or free air. Musculoskeletal: No acute or significant osseous findings. Review of the MIP images confirms the above findings. IMPRESSION: 1. Right lower lobe posterior basilar branch point subsegmental embolus. Downstream from this multiple small posterior basal distal branches are unopacified compared to the left and probably also harbor  thrombus, but this was seen on the studies from February 2024 and probably chronic. The overall clot burden appears small. 2. No other evidence of acute PE. No findings of acute right heart strain. 3. Mild cardiomegaly. 4. 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 5. No acute findings in the abdomen or pelvis. 6. Constipation. 7. Small umbilical fat hernia. 8. Interval PEG tube Removal since 03/26/2023. No secondary complication is seen. Electronically Signed   By: Francis Quam M.D.   On: 09/10/2023 06:26   CT ABDOMEN PELVIS W CONTRAST Result Date: 09/10/2023 CLINICAL DATA:  On 02/22/2023 the patient had a large left hemispheric MCA stroke, with weakness since then. Patient is only intermittently mobile with high risk for pulmonary embolism and history of right lower lobe PE in February 2024. She also presents with left lower quadrant abdominal pain. EXAM: CT  ANGIOGRAPHY CHEST CT ABDOMEN AND PELVIS WITH CONTRAST TECHNIQUE: Multidetector CT imaging of the chest was performed using the standard protocol during bolus administration of intravenous contrast. Multiplanar CT image reconstructions and MIPs were obtained to evaluate the vascular anatomy. Multidetector CT imaging of the abdomen and pelvis was performed using the standard protocol during bolus administration of intravenous contrast. RADIATION DOSE REDUCTION: This exam was performed according to the departmental dose-optimization program which includes automated exposure control, adjustment of the mA and/or kV according to patient size and/or use of iterative reconstruction technique. CONTRAST:  OMNIPAQUE  IOHEXOL  350 MG/ML SOLN COMPARISON:  CTA chest studies 04/08/2022, 04/13/2022 and 11/03/2021, CT abdomen contrast 03/26/2023. No prior pelvic CT. FINDINGS: CTA CHEST FINDINGS Cardiovascular: The pulmonary trunk is upper limit normal in caliber, unchanged. Mild cardiomegaly. The pulmonary veins are nondistended. There is a right lower lobe posterior basilar branch point subsegmental embolus on series 4 axial 65-67. Downstream from this multiple small posterior basal distal branches are unopacified compared to the left and probably also harbor  thrombus, but this was seen on the studies from February 2024 and probably chronic. Remainder of the pulmonary arteries appear free of thrombus. There were several lateral basal segmental and subsegmental emboli on the prior studies which are no longer seen. There is a small chronic anterior pericardial effusion. The aorta and great vessels are normal. Mediastinum/Nodes: No enlarged mediastinal, hilar, or axillary lymph nodes. Thyroid gland, trachea, and esophagus demonstrate no significant findings. Lungs/Pleura: Diffuse bronchial thickening. There is mild posterior atelectasis. Consolidation change previously in the right lower lobe basal segments has resolved. There is  diffuse mosaicism of the lungs likely from air trapping with small airways disease. There is no consolidation, effusion, pneumothorax or nodules. Musculoskeletal: No chest wall abnormality. No acute or significant osseous findings. Review of the MIP images confirms the above findings. CT ABDOMEN and PELVIS FINDINGS Hepatobiliary: No focal liver abnormality is seen. No gallstones, gallbladder wall thickening, or biliary  dilatation. Pancreas: No abnormality. Spleen: No abnormality. 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 No adrenal or renal mass enhancement. No urinary stone or obstruction. Unremarkable bladder for the degree of distension. Stomach/Bowel: Since 03/26/2023, interval PEG tube Removal. No secondary complication is seen. No GI tract dilatation or wall thickening. Normal appendix. Moderate retained stool ascending colon. No evidence of colitis or diverticulitis. Vascular/Lymphatic: No significant vascular findings are present. No enlarged abdominal or pelvic lymph nodes. Reproductive: Uterus and bilateral adnexa are unremarkable. Other: Mild rectus diastasis at the umbilicus and small umbilical fat hernia. No incarcerated hernia. No free fluid, free hemorrhage or free air. Musculoskeletal: No acute or significant osseous findings. Review of the MIP images confirms the above findings. IMPRESSION: 1. Right lower lobe posterior basilar branch point subsegmental embolus. Downstream from this multiple small posterior basal distal branches are unopacified compared to the left and probably also harbor  thrombus, but this was seen on the studies from February 2024 and probably chronic. The overall clot burden appears small. 2. No other evidence of acute PE. No findings of acute right heart strain. 3. Mild cardiomegaly. 4. 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 5. No acute findings in the abdomen or  pelvis. 6. Constipation. 7. Small umbilical fat hernia. 8. Interval PEG tube Removal since 03/26/2023. No secondary complication is seen. Electronically Signed   By: Francis Quam M.D.   On: 09/10/2023 06:26   CT Head Wo Contrast Result Date: 09/10/2023 CLINICAL DATA:  Headache EXAM: CT HEAD WITHOUT CONTRAST TECHNIQUE: Contiguous axial images were obtained from the base of the skull through the vertex without intravenous contrast. RADIATION DOSE REDUCTION: This exam was performed according to the departmental dose-optimization program which includes automated exposure control, adjustment of the mA and/or kV according to patient size and/or use of iterative reconstruction technique. 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 No acute intracranial abnormality. Specifically, no hemorrhage, hydrocephalus, mass lesion, acute infarction, or significant intracranial injury. Vascular: No hyperdense vessel or unexpected calcification. Skull: No acute calvarial abnormality. Sinuses/Orbits: No acute findings Other: None IMPRESSION: Stable left frontoparietal and temporal encephalomalacia. No acute intracranial abnormality. Electronically Signed   By: Franky Crease M.D.   On: 09/10/2023 01:17   DG Chest Port 1 View Result Date: 09/10/2023 CLINICAL DATA:  Chest pain.  Altered mental status.  Confusion. EXAM: PORTABLE CHEST 1 VIEW COMPARISON:  03/26/2023 FINDINGS: The heart size and mediastinal contours are within normal limits. Both lungs are clear. The visualized skeletal structures are unremarkable. IMPRESSION: No active disease. Electronically Signed   By: Elsie Gravely M.D.   On: 09/10/2023 01:02    Pending Labs Unresulted Labs (From admission, onward)     Start     Ordered   09/11/23 0500  CBC  Tomorrow morning,   R        09/10/23 0759   09/11/23 0500  Comprehensive metabolic panel  Tomorrow morning,   R         09/10/23 0759            Vitals/Pain Today's Vitals   09/10/23 0400 09/10/23 0410 09/10/23 0753 09/10/23 1122  BP:  (!) 109/97 102/64 130/70  Pulse:  79 82 77  Resp:  14 18 18   Temp: 97.8 F (36.6 C)  98 F (36.7 C) 98.1 F (36.7 C)  TempSrc: Oral  Oral  Oral  SpO2:  93% 100% 100%  Weight:      Height:      PainSc:        Isolation Precautions No active isolations  Medications Medications  apixaban  (ELIQUIS ) tablet 10 mg (10 mg Oral Given 09/10/23 1008)    Followed by  apixaban  (ELIQUIS ) tablet 5 mg (has no administration in time range)  acetaminophen  (TYLENOL ) tablet 650 mg (has no administration in time range)    Or  acetaminophen  (TYLENOL ) suppository 650 mg (has no administration in time range)  ondansetron  (ZOFRAN ) tablet 4 mg (has no administration in time range)    Or  ondansetron  (ZOFRAN ) injection 4 mg (has no administration in time range)  metoCLOPramide  (REGLAN ) injection 10 mg (10 mg Intravenous Given 09/10/23 0105)  iohexol  (OMNIPAQUE ) 350 MG/ML injection 100 mL (100 mLs Intravenous Contrast Given 09/10/23 0457)  prochlorperazine  (COMPAZINE ) injection 10 mg (10 mg Intravenous Given 09/10/23 0802)  diphenhydrAMINE  (BENADRYL ) injection 12.5 mg (12.5 mg Intravenous Given 09/10/23 0802)  gabapentin  (NEURONTIN ) capsule 100 mg (100 mg Oral Given 09/10/23 0801)    Mobility walks with person assist

## 2023-09-10 NOTE — Plan of Care (Signed)
  Problem: Clinical Measurements: Goal: Ability to maintain clinical measurements within normal limits will improve Outcome: Progressing Goal: Will remain free from infection Outcome: Progressing Goal: Diagnostic test results will improve Outcome: Progressing Goal: Respiratory complications will improve Outcome: Progressing   Problem: Activity: Goal: Risk for activity intolerance will decrease Outcome: Progressing   Problem: Nutrition: Goal: Adequate nutrition will be maintained Outcome: Progressing   Problem: Coping: Goal: Level of anxiety will decrease Outcome: Progressing   Problem: Pain Managment: Goal: General experience of comfort will improve and/or be controlled Outcome: Progressing   Problem: Safety: Goal: Ability to remain free from injury will improve Outcome: Progressing   Problem: Skin Integrity: Goal: Risk for impaired skin integrity will decrease Outcome: Progressing

## 2023-09-10 NOTE — Progress Notes (Signed)
*  PRELIMINARY RESULTS* Echocardiogram 2D Echocardiogram has been performed.  Jacqueline [name] Jacqueline [name] [place]ion 09/10/2023, 2:32 PM

## 2023-09-10 NOTE — H&P (Signed)
 History and Physical    Patient: Jacqueline [name] FMW:[id number] DOB: [birth year] DOA: 09/09/2023 DOS: the patient was seen and examined on 09/10/2023 PCP: Tonnie Raisin, MD  Patient coming from: Home  Chief Complaint:  Chief Complaint  Patient presents with   Weakness   HPI: Jacqueline [name] is a 23 y.o. 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  She has also had chest pain and mild dyspnea.  She denied fever, chills, rhinorrhea, sore throat, wheezing or hemoptysis. No palpitations, diaphoresis, PND, orthopnea or pitting edema of the lower extremities.  No abdominal pain, nausea, emesis, diarrhea, constipation, melena or hematochezia.  No flank pain, dysuria, frequency or hematuria.  No polyuria, polydipsia, polyphagia or blurred vision.   Lab work: Urinalysis with moderate hemoglobin proteinuria of 100 mg/dL.  CBC showed a Crute count of 4.8, hemoglobin 12.5 g/dL and platelets 891.  CMP showed a CO2 of 19 mmol/L with a normal anion gap, the rest of the measurements were normal after calcium correction.  Serum pregnancy test was negative.  Lipase was normal.  Troponin was 35 and 31 ng/L.  Imaging: Portable 1 view chest radiograph with no active disease.  CT head without contrast with no acute intracranial abnormality.  There is stable left frontoparietal and temporal encephalomalacia.  CTA chest showing a right lower lobe posterior basilar branch point subsegmental embolus.  Downstream from this multiple small posterior basal distal branches are opacified compared to the left and probably also harbor  thrombus, this was seen on the  studies from February 2024 and probably chronic.  No other evidence of PE.  No heart strain.  Mild cardiomegaly.  Diffuse bronchial thickening likely from air trapping with small airways disease.  CT abdomen/pelvis with contrast no acute findings in the abdomen or pelvis there was constipation, small umbilical hernia.  Interval removal of PEG tube.   ED course: Initial vital signs were temperature 97.8 F, pulse 93, respiration 20, BP 130/77 mmHg O2 sat 100% on room air.  Patient received Benadryl  12.5 mg IVP, gabapentin  100 mg p.o. x 1, metoclopramide  10 mg IVP Compazine  10 mg IVP.  Review of Systems: As mentioned in the history of present illness. All other systems reviewed and are negative. Past Medical History:  Diagnosis Date   Bacterial vaginitis 07/10/2016   Candida vaginitis 07/24/2016   Headache in pregnancy, antepartum, third trimester 07/24/2016   Kell isoimmunization during pregnancy    Stroke [facility])    Past Surgical History:  Procedure Laterality Date   CESAREAN SECTION N/A 09/04/2016   Procedure: CESAREAN SECTION;  Surgeon: [name], DO;  Location: [facility];  Service: Obstetrics;  Laterality: N/A;   CESAREAN SECTION Bilateral    IR CT HEAD LTD  02/21/2023   IR GASTROSTOMY TUBE MOD SED  03/21/2023   IR GASTROSTOMY TUBE REMOVAL  05/14/2023   IR PERCUTANEOUS ART THROMBECTOMY/INFUSION INTRACRANIAL INC DIAG ANGIO  02/21/2023   IR REPLC GASTRO/COLONIC TUBE PERCUT W/FLUORO  03/27/2023   RADIOLOGY WITH ANESTHESIA N/A 02/21/2023   Procedure: IR WITH ANESTHESIA;  Surgeon: Dolphus Carrion, MD;  Location: [facility] OR;  Service: Radiology;  Laterality: N/A;   Social History:  reports that she has quit smoking. Her smoking use included cigars. She has never  used smokeless tobacco. She reports that she does not currently use alcohol. She reports that she does not use drugs.  No Known Allergies  Family History  Problem Relation Age of Onset   Hypertension Maternal Grandmother     Diabetes Maternal Grandmother    Cancer Neg Hx     Prior to Admission medications   Medication Sig Start Date End Date Taking? Authorizing Provider  acetaminophen  (TYLENOL ) 325 MG tablet Take 2 tablets (650 mg total) by mouth every 4 (four) hours as needed for mild pain (pain score 1-3) (or temp > 37.5 C (99.5 F)). 04/22/23   Angiulli, Toribio PARAS, PA-C  apixaban  (ELIQUIS ) 2.5 MG TABS tablet Take 1 tablet (2.5 mg total) by mouth 2 (two) times daily. 07/02/23   Onesimo Emaline Brink, MD  clonazePAM  (KLONOPIN ) 0.5 MG tablet Take 1 tablet (0.5 mg total) by mouth at bedtime. Patient not taking: Reported on 08/05/2023 05/23/23   Debby Fidela CROME, NP  FLUoxetine  (PROZAC ) 10 MG capsule Take 1 capsule by mouth daily. Patient not taking: Reported on 08/05/2023 05/06/23   [provider]  gabapentin  (NEURONTIN ) 100 MG capsule Take 1 capsule (100 mg total) by mouth 3 (three) times daily. 08/05/23   Penumalli, Vikram R, MD  iron  polysaccharides (NIFEREX) 150 MG capsule Take 1 capsule (150 mg total) by mouth daily. Patient not taking: Reported on 08/05/2023 04/15/23   Angiulli, Toribio PARAS, PA-C  lidocaine -prilocaine  (EMLA ) cream Apply to affected area once 06/04/23   Kale, Gautam Kishore, MD  naloxone  (NARCAN ) nasal spray 4 mg/0.1 mL Place 1 spray into the nose as needed (opioid overdose). Patient not taking: Reported on 08/05/2023 05/06/23   [provider]    Physical Exam: Vitals:   09/10/23 0030 09/10/23 0130 09/10/23 0400 09/10/23 0410  BP: 110/75 (!) 119/52  (!) 109/97  Pulse: 95 79  79  Resp: 17 (!) 26  14  Temp:   97.8 F (36.6 C)   TempSrc:   Oral   SpO2: 100% 97%  93%  Weight:      Height:       Physical Exam Vitals and nursing note reviewed.  Constitutional:      Appearance: Normal appearance. She is ill-appearing.  HENT:     Head: Normocephalic.     Nose: No rhinorrhea.     Mouth/Throat:     Mouth: Mucous membranes are dry.  Eyes:     General: No scleral icterus.    Pupils:  Pupils are equal, round, and reactive to light.  Cardiovascular:     Rate and Rhythm: Normal rate and regular rhythm.  Pulmonary:     Effort: Pulmonary effort is normal.     Breath sounds: Normal breath sounds. No wheezing, rhonchi or rales.  Abdominal:     General: Bowel sounds are normal. There is no distension.     Palpations: Abdomen is soft.     Tenderness: There is no abdominal tenderness.  Musculoskeletal:     Cervical back: Neck supple.     Right lower leg: No edema.     Left lower leg: No edema.  Skin:    General: Skin is warm and dry.  Neurological:     Mental Status: She is alert and oriented to person, place, and time. Mental status is at baseline.  Psychiatric:        Mood and Affect: Mood normal.        Behavior: Behavior normal.     Data Reviewed:  Results are  pending, will review when available. Echocardiogram done today. IMPRESSIONS:   1. LV function low normal to mildly reduced; EF 50.   2. Left ventricular ejection fraction, by estimation, is 50 to 55%. The  left ventricle has low normal function. The left ventricle has no regional  wall motion abnormalities. Left ventricular diastolic parameters were  normal.   3. Right ventricular systolic function is normal. The right ventricular  size is normal. Tricuspid regurgitation signal is inadequate for assessing  PA pressure.   4. The mitral valve is normal in structure. Mild mitral valve  regurgitation. No evidence of mitral stenosis.   5. The aortic valve is tricuspid. Aortic valve regurgitation is not  visualized. No aortic stenosis is present.   6. The inferior vena cava is normal in size with greater than 50%  respiratory variability, suggesting right atrial pressure of 3 mmHg.   EKG: Vent. rate 86 BPM PR interval 169 ms QRS duration 93 ms QT/QTcB 390/467 ms P-R-T axes 47 68 39 Sinus rhythm  Assessment and Plan: Principal Problem:   Pulmonary emboli (HCC) With previous prior history In the  setting of:   Antiphospholipid antibody syndrome (HCC) The patient has not used apixaban  in 5 days. Telemetry/observation. As needed supplemental oxygen. Continue apixaban  loading dose. Then apixaban  5 mg p.o. twice daily. Check echocardiogram. Check lower extremities Doppler. Follow-up CBC and chemistry in the morning.  Active Problems:   Elevated troponin Likely demand ischemia. No heart strain on CTA or echo.  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 Supportive care. Told about the need of using    Thrombocytopenia (HCC) Monitor platelet count.    Class 1 obesity Current BMI 32.96 kg/m. Would benefit from lifestyle modifications. Follow-up closely with PCP.     Advance Care Planning:   Code Status: Full Code   Consults:   Family Communication:   Severity of Illness: The appropriate patient status for this patient is INPATIENT. Inpatient status is judged to be reasonable and necessary in order to provide the required intensity of service to ensure the patient's safety. The patient's presenting symptoms, physical exam findings, and initial radiographic and laboratory data in the context of their chronic comorbidities is felt to place them at high risk for further clinical deterioration. Furthermore, it is not anticipated that the patient will be medically stable for discharge from the hospital within 2 midnights of admission.   * I certify that at the point of admission it is my clinical judgment that the patient will require inpatient hospital care spanning beyond 2 midnights from the point of admission due to high intensity of service, high risk for further deterioration and high frequency of surveillance required.*  Author: Alm Dorn Castor, MD 09/10/2023 7:42 AM  For on call review [email or web address].   This document was prepared using Dragon voice recognition software and may contain some unintended  transcription errors.

## 2023-09-10 NOTE — ED Provider Notes (Signed)
 [place] EMERGENCY DEPARTMENT AT [facility] Provider Note   CSN: [id number] Arrival date & time: 09/09/23  1958     Patient presents with: Weakness   Jacqueline [name] is a 23 y.o. female.   The history is provided by the patient.  Weakness Jacqueline [name] is a 23 y.o. female who presents to the Emergency Department complaining of headache.  She presents to the emergency department for evaluation of right-sided headaches that have been going on for a long time.  She describes a headache as 10 out of 10 and her medications are not working.  She also reports left-sided chest pain that she has difficulty describing.  No difficulty breathing.  She does have occasional cough, no fever, vomiting.  She also complains of left upper quadrant abdominal pain for an unclear period of time.  She does live alone.  She has a history of prior CVA secondary to antiphospholipid syndrome.  She ambulates with a wheelchair and a cane.  She has missed her medications for several days due to her meds being at her sister's house.  She does currently have them now.  She is having difficulty with self-care.      Prior to Admission medications   Medication Sig Start Date End Date Taking? Authorizing Provider  acetaminophen  (TYLENOL ) 325 MG tablet Take 2 tablets (650 mg total) by mouth every 4 (four) hours as needed for mild pain (pain score 1-3) (or temp > 37.5 C (99.5 F)). 04/22/23   Angiulli, Toribio PARAS, PA-C  apixaban  (ELIQUIS ) 2.5 MG TABS tablet Take 1 tablet (2.5 mg total) by mouth 2 (two) times daily. 07/02/23   Onesimo Emaline Brink, MD  clonazePAM  (KLONOPIN ) 0.5 MG tablet Take 1 tablet (0.5 mg total) by mouth at bedtime. Patient not taking: Reported on 08/05/2023 05/23/23   Debby Fidela CROME, NP  FLUoxetine  (PROZAC ) 10 MG capsule Take 1 capsule by mouth daily. Patient not taking: Reported on 08/05/2023 05/06/23   [provider]  gabapentin  (NEURONTIN ) 100 MG capsule Take 1 capsule (100 mg total)  by mouth 3 (three) times daily. 08/05/23   Penumalli, Eduard SAUNDERS, MD  iron  polysaccharides (NIFEREX) 150 MG capsule Take 1 capsule (150 mg total) by mouth daily. Patient not taking: Reported on 08/05/2023 04/15/23   Angiulli, Toribio PARAS, PA-C  lidocaine -prilocaine  (EMLA ) cream Apply to affected area once 06/04/23   Kale, Gautam Kishore, MD  naloxone  (NARCAN ) nasal spray 4 mg/0.1 mL Place 1 spray into the nose as needed (opioid overdose). Patient not taking: Reported on 08/05/2023 05/06/23   [provider]    Allergies: Patient has no known allergies.    Review of Systems  Neurological:  Positive for weakness.  All other systems reviewed and are negative.   Updated Vital Signs BP 109/73 (BP Location: Left Arm)   Pulse 94   Temp 97.8 F (36.6 C) (Oral)   Resp 18   Ht 5' 4 (1.626 m)   Wt 87.1 kg   LMP 09/09/2023   SpO2 98%   BMI 32.96 kg/m   Physical Exam Vitals and nursing note reviewed.  Constitutional:      Appearance: She is well-developed.  HENT:     Head: Normocephalic and atraumatic.  Cardiovascular:     Rate and Rhythm: Normal rate and regular rhythm.     Heart sounds: No murmur heard. Pulmonary:     Effort: Pulmonary effort is normal. No respiratory distress.     Breath sounds: Normal breath sounds.  Abdominal:     Palpations: Abdomen is soft.     Tenderness: There is no guarding or rebound.     Comments: Moderate LUQ tenderness  Musculoskeletal:        General: No tenderness.  Skin:    General: Skin is warm and dry.  Neurological:     Mental Status: She is alert and oriented to person, place, and time.     Comments: slow speech.  Mild RUE RLE weakness.    Psychiatric:        Behavior: Behavior normal.     (all labs ordered are listed, but only abnormal results are displayed) Labs Reviewed  COMPREHENSIVE METABOLIC PANEL WITH GFR - Abnormal; Notable for the following components:      Result Value   CO2 19 (*)    Calcium 8.8 (*)    All other components  within normal limits  CBC - Abnormal; Notable for the following components:   HCT 35.9 (*)    Platelets 108 (*)    All other components within normal limits  CBG MONITORING, ED - Abnormal; Notable for the following components:   Glucose-Capillary 111 (*)    All other components within normal limits  HCG, SERUM, QUALITATIVE  URINALYSIS, ROUTINE W REFLEX MICROSCOPIC    EKG: None  Radiology: No results found.   Procedures   Medications Ordered in the ED - No data to display                                  Medical Decision Making Amount and/or Complexity of Data Reviewed Labs: ordered. Radiology: ordered.  Risk Prescription drug management.    Patient with history of MCA CVA, antiphospholipid syndrome, PE here for evaluation of headache since her stroke, left-sided chest pain and left-sided abdominal pain.  Troponins are mildly elevated.  EKG is nonischemic.  CTA was obtained, which does demonstrate chronic PE as well as additional small subsegmental PE.  No evidence of heart strain.  She has been noncompliant with her medications.  Suspect this is secondary to social issues although entire history is not fully obtainable.  It sounds like patient was living with 1 sister but then was either kicked out or moved out of her sister's house and moved in with another sister.  The current sister that she is staying with is concerned that the patient has not been getting the care she needs or take her medications as directed.  Given her recurrent PE, ongoing headache despite medications, elevated troponins and difficult social situation plan to admit for ongoing care.  Medicine consulted for admission.    Final diagnoses:  None    ED Discharge Orders     None          Griselda Norris, MD 09/10/23 [phone]

## 2023-09-11 ENCOUNTER — Observation Stay (HOSPITAL_COMMUNITY)

## 2023-09-11 DIAGNOSIS — I2489 Other forms of acute ischemic heart disease: Secondary | ICD-10-CM | POA: Diagnosis present

## 2023-09-11 DIAGNOSIS — I2609 Other pulmonary embolism with acute cor pulmonale: Secondary | ICD-10-CM | POA: Diagnosis not present

## 2023-09-11 DIAGNOSIS — R7989 Other specified abnormal findings of blood chemistry: Secondary | ICD-10-CM | POA: Diagnosis not present

## 2023-09-11 DIAGNOSIS — I071 Rheumatic tricuspid insufficiency: Secondary | ICD-10-CM | POA: Diagnosis present

## 2023-09-11 DIAGNOSIS — I639 Cerebral infarction, unspecified: Secondary | ICD-10-CM | POA: Diagnosis not present

## 2023-09-11 DIAGNOSIS — I2690 Septic pulmonary embolism without acute cor pulmonale: Secondary | ICD-10-CM | POA: Diagnosis not present

## 2023-09-11 DIAGNOSIS — G43909 Migraine, unspecified, not intractable, without status migrainosus: Secondary | ICD-10-CM | POA: Diagnosis present

## 2023-09-11 DIAGNOSIS — E66811 Obesity, class 1: Secondary | ICD-10-CM | POA: Diagnosis present

## 2023-09-11 DIAGNOSIS — N179 Acute kidney failure, unspecified: Secondary | ICD-10-CM | POA: Diagnosis present

## 2023-09-11 DIAGNOSIS — Z86711 Personal history of pulmonary embolism: Secondary | ICD-10-CM | POA: Diagnosis not present

## 2023-09-11 DIAGNOSIS — Z91148 Patient's other noncompliance with medication regimen for other reason: Secondary | ICD-10-CM | POA: Diagnosis not present

## 2023-09-11 DIAGNOSIS — Z79899 Other long term (current) drug therapy: Secondary | ICD-10-CM | POA: Diagnosis not present

## 2023-09-11 DIAGNOSIS — I63512 Cerebral infarction due to unspecified occlusion or stenosis of left middle cerebral artery: Secondary | ICD-10-CM | POA: Diagnosis not present

## 2023-09-11 DIAGNOSIS — I69351 Hemiplegia and hemiparesis following cerebral infarction affecting right dominant side: Secondary | ICD-10-CM | POA: Diagnosis not present

## 2023-09-11 DIAGNOSIS — I2693 Single subsegmental pulmonary embolism without acute cor pulmonale: Secondary | ICD-10-CM | POA: Diagnosis present

## 2023-09-11 DIAGNOSIS — Z6832 Body mass index (BMI) 32.0-32.9, adult: Secondary | ICD-10-CM | POA: Diagnosis not present

## 2023-09-11 DIAGNOSIS — I2699 Other pulmonary embolism without acute cor pulmonale: Secondary | ICD-10-CM | POA: Diagnosis not present

## 2023-09-11 DIAGNOSIS — D696 Thrombocytopenia, unspecified: Secondary | ICD-10-CM | POA: Diagnosis present

## 2023-09-11 DIAGNOSIS — Z8249 Family history of ischemic heart disease and other diseases of the circulatory system: Secondary | ICD-10-CM | POA: Diagnosis not present

## 2023-09-11 DIAGNOSIS — I69320 Aphasia following cerebral infarction: Secondary | ICD-10-CM | POA: Diagnosis not present

## 2023-09-11 DIAGNOSIS — Z5900 Homelessness unspecified: Secondary | ICD-10-CM | POA: Diagnosis not present

## 2023-09-11 DIAGNOSIS — Z7901 Long term (current) use of anticoagulants: Secondary | ICD-10-CM | POA: Diagnosis not present

## 2023-09-11 DIAGNOSIS — Z87891 Personal history of nicotine dependence: Secondary | ICD-10-CM | POA: Diagnosis not present

## 2023-09-11 DIAGNOSIS — F419 Anxiety disorder, unspecified: Secondary | ICD-10-CM | POA: Diagnosis present

## 2023-09-11 DIAGNOSIS — D6861 Antiphospholipid syndrome: Secondary | ICD-10-CM | POA: Diagnosis present

## 2023-09-11 DIAGNOSIS — Z833 Family history of diabetes mellitus: Secondary | ICD-10-CM | POA: Diagnosis not present

## 2023-09-11 DIAGNOSIS — I2782 Chronic pulmonary embolism: Secondary | ICD-10-CM | POA: Diagnosis present

## 2023-09-11 DIAGNOSIS — R29707 NIHSS score 7: Secondary | ICD-10-CM | POA: Diagnosis not present

## 2023-09-11 LAB — COMPREHENSIVE METABOLIC PANEL WITH GFR
ALT: 13 U/L (ref 0–44)
AST: 27 U/L (ref 15–41)
Albumin: 3.2 g/dL — ABNORMAL LOW (ref 3.5–5.0)
Alkaline Phosphatase: 54 U/L (ref 38–126)
Anion gap: 9 (ref 5–15)
BUN: 18 mg/dL (ref 6–20)
CO2: 19 mmol/L — ABNORMAL LOW (ref 22–32)
Calcium: 7.8 mg/dL — ABNORMAL LOW (ref 8.9–10.3)
Chloride: 103 mmol/L (ref 98–111)
Creatinine, Ser: 1.26 mg/dL — ABNORMAL HIGH (ref 0.44–1.00)
GFR, Estimated: >60 mL/min (ref >60)
Glucose, Bld: 88 mg/dL (ref 70–99)
Potassium: 3.5 mmol/L (ref 3.5–5.1)
Sodium: 131 mmol/L — ABNORMAL LOW (ref 135–145)
Total Bilirubin: 1 mg/dL (ref 0.0–1.2)
Total Protein: 6.3 g/dL — ABNORMAL LOW (ref 6.5–8.1)

## 2023-09-11 LAB — CBC
HCT: 31.1 % — ABNORMAL LOW (ref 36.0–46.0)
Hemoglobin: 11.1 g/dL — ABNORMAL LOW (ref 12.0–15.0)
MCH: 29.6 pg (ref 26.0–34.0)
MCHC: 35.7 g/dL (ref 30.0–36.0)
MCV: 82.9 fL (ref 80.0–100.0)
Platelets: 93 K/uL — ABNORMAL LOW (ref 150–400)
RBC: 3.75 MIL/uL — ABNORMAL LOW (ref 3.87–5.11)
RDW: 13.1 % (ref 11.5–15.5)
WBC: 4.1 K/uL (ref 4.0–10.5)
nRBC: 0 % (ref 0.0–0.2)

## 2023-09-11 MED ORDER — KETOROLAC TROMETHAMINE 15 MG/ML IJ SOLN
30.0000 mg | Freq: Once | INTRAMUSCULAR | Status: AC
  Administered 2023-09-11: 30 mg via INTRAVENOUS
  Filled 2023-09-11: qty 2

## 2023-09-11 MED ORDER — KETOROLAC TROMETHAMINE 15 MG/ML IJ SOLN
30.0000 mg | Freq: Once | INTRAMUSCULAR | Status: AC
  Administered 2023-09-11: 30 mg via INTRAVENOUS

## 2023-09-11 MED ORDER — BUTALBITAL-APAP-CAFFEINE 50-325-40 MG PO TABS
1.0000 | ORAL_TABLET | Freq: Four times a day (QID) | ORAL | Status: AC | PRN
  Administered 2023-09-11 – 2023-09-12 (×2): 1 via ORAL
  Administered 2023-09-12 – 2023-09-13 (×3): 2 via ORAL
  Filled 2023-09-11: qty 1
  Filled 2023-09-11 (×2): qty 2
  Filled 2023-09-11: qty 1
  Filled 2023-09-11: qty 2

## 2023-09-11 MED ORDER — KETOROLAC TROMETHAMINE 30 MG/ML IJ SOLN
INTRAMUSCULAR | Status: AC
  Filled 2023-09-11: qty 1

## 2023-09-11 MED ORDER — PROCHLORPERAZINE EDISYLATE 10 MG/2ML IJ SOLN
10.0000 mg | Freq: Once | INTRAMUSCULAR | Status: AC
  Administered 2023-09-11: 10 mg via INTRAVENOUS
  Filled 2023-09-11: qty 2

## 2023-09-11 MED ORDER — BUTALBITAL-APAP-CAFFEINE 50-325-40 MG PO TABS
1.0000 | ORAL_TABLET | Freq: Four times a day (QID) | ORAL | Status: AC | PRN
  Administered 2023-09-11 (×2): 1 via ORAL
  Filled 2023-09-11 (×2): qty 1

## 2023-09-11 NOTE — Plan of Care (Signed)
  Problem: Health Behavior/Discharge Planning: Goal: Ability to manage health-related needs will improve Outcome: Progressing   Problem: Clinical Measurements: Goal: Ability to maintain clinical measurements within normal limits will improve Outcome: Progressing Goal: Will remain free from infection Outcome: Progressing Goal: Diagnostic test results will improve Outcome: Progressing Goal: Respiratory complications will improve Outcome: Progressing Goal: Cardiovascular complication will be avoided Outcome: Progressing   Problem: Activity: Goal: Risk for activity intolerance will decrease Outcome: Progressing   Problem: Nutrition: Goal: Adequate nutrition will be maintained Outcome: Progressing   Problem: Coping: Goal: Level of anxiety will decrease Outcome: Progressing   Problem: Safety: Goal: Ability to remain free from injury will improve Outcome: Progressing   Problem: Skin Integrity: Goal: Risk for impaired skin integrity will decrease Outcome: Progressing   Problem: Health Behavior/Discharge Planning: Goal: Ability to manage health-related needs will improve Outcome: Progressing   Problem: Clinical Measurements: Goal: Ability to maintain clinical measurements within normal limits will improve Outcome: Progressing Goal: Will remain free from infection Outcome: Progressing Goal: Diagnostic test results will improve Outcome: Progressing Goal: Respiratory complications will improve Outcome: Progressing Goal: Cardiovascular complication will be avoided Outcome: Progressing   Problem: Activity: Goal: Risk for activity intolerance will decrease Outcome: Progressing   Problem: Nutrition: Goal: Adequate nutrition will be maintained Outcome: Progressing   Problem: Coping: Goal: Level of anxiety will decrease Outcome: Progressing   Problem: Safety: Goal: Ability to remain free from injury will improve Outcome: Progressing   Problem: Skin Integrity: Goal:  Risk for impaired skin integrity will decrease Outcome: Progressing

## 2023-09-11 NOTE — Progress Notes (Signed)
   09/11/23 0846  TOC Brief Assessment  Insurance and Status Reviewed  Patient has primary care physician Yes  Home environment has been reviewed home w/ family  Prior level of function: independent  Prior/Current Home Services No current home services  Social Drivers of Health Review SDOH reviewed no interventions necessary  Readmission risk has been reviewed Yes  Transition of care needs no transition of care needs at this time

## 2023-09-11 NOTE — Progress Notes (Signed)
 BLE venous duplex has been completed.   Results can be found under chart review under CV PROC. 09/11/2023 11:16 AM Cristiano Capri RVT, RDMS

## 2023-09-11 NOTE — Progress Notes (Signed)
 PROGRESS NOTE    Jacqueline [name]  FMW:[id number] DOB: 05/09/2000 DOA: 09/09/2023 PCP: Tonnie Raisin, MD   Brief Narrative:  23 y.o. 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  She has also had chest pain and mild dyspnea.  She denied fever, chills, rhinorrhea, sore throat, wheezing or hemoptysis. No palpitations, diaphoresis, PND, orthopnea or pitting edema of the lower extremities.  No abdominal pain, nausea, emesis, diarrhea, constipation, melena or hematochezia.  No flank pain, dysuria, frequency or hematuria.  No polyuria, polydipsia, polyphagia or blurred vision.    Lab work: Urinalysis with moderate hemoglobin proteinuria of 100 mg/dL.  CBC showed a Porzio count of 4.8, hemoglobin 12.5 g/dL and platelets 891.  CMP showed a CO2 of 19 mmol/L with a normal anion gap, the rest of the measurements were normal after calcium correction.  Serum pregnancy test was negative.  Lipase was normal.  Troponin was 35 and 31 ng/L.   Imaging: Portable 1 view chest radiograph with no active disease.  CT head without contrast with no acute intracranial abnormality.  There is stable left frontoparietal and temporal encephalomalacia.  CTA chest showing a right lower lobe posterior basilar branch point subsegmental embolus.  Downstream from this multiple small posterior basal distal branches are opacified compared to the left and probably also harbor  thrombus, this was seen on the studies from February 2024 and probably chronic.  No other evidence of PE.  No heart strain.  Mild cardiomegaly.  Diffuse bronchial thickening likely from air trapping with  small airways disease.  CT abdomen/pelvis with contrast no acute findings in the abdomen or pelvis there was constipation, small umbilical hernia.  Interval removal of PEG tube.   ED course: Initial vital signs were temperature 97.8 F, pulse 93, respiration 20, BP 130/77 mmHg O2 sat 100% on room air.  Patient received Benadryl  12.5 mg IVP, gabapentin  100 mg p.o. x 1, metoclopramide  10 mg IVP Compazine  10 mg IVP.    Assessment & Plan:   Principal Problem:   Pulmonary emboli (HCC) Active Problems:   Left middle cerebral artery stroke (HCC)   Thrombocytopenia (HCC)   Antiphospholipid antibody syndrome (HCC)   Class 1 obesity   Elevated troponin      Pulmonary emboli (HCC) With previous prior history In the setting of:   Antiphospholipid antibody syndrome (HCC) The patient has not used apixaban  in 7 days or more apixaban  5 mg p.o. twice daily. Check echocardiogram -LV function low normal to mildly reduced; EF 50.   Left ventricular ejection fraction, by estimation, is 50 to 55%. The  left ventricle has low normal function. The left ventricle has no regional  wall motion abnormalities. Left ventricular diastolic parameters were  normal. Right ventricular systolic function is normal. The right ventricular  size is normal. Tricuspid regurgitation signal is inadequate for assessing  PA pressure. The mitral valve is normal in structure. Mild mitral valve  regurgitation. No evidence of mitral stenosis. The aortic valve is tricuspid. Aortic valve regurgitation is not  visualized. No aortic stenosis is present.  The inferior vena cava is normal in size with greater than 50%  respiratory variability, suggesting right atrial pressure of 3 mmHg.  Check lower extremities Doppler no dvt   Active Problems:  Elevated troponin Likely demand ischemia. No heart strain on CTA or echo.   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 Supportive care.     Thrombocytopenia (HCC) Monitor platelet count.     Class 1 obesity Current BMI 32.96 kg/m. Would benefit from lifestyle modifications. Follow-up closely with PCP.    Estimated body mass index is 32.96 kg/m as calculated from the following:   Height as of this encounter: 5' 4 (1.626 m).   Weight as of this encounter: 87.1 kg.  DVT prophylaxis: eliquis  Code Status: full Family Communication: Disposition Plan:  Status is: Inpatient Remains inpatient appropriate because: PE   Consultants: NONE  Procedures: none Antimicrobials: none  Subjective:  C/o pleuritic chest pain  Objective: Vitals:   09/10/23 1901 09/10/23 2248 09/11/23 0457 09/11/23 1347  BP: 106/61 112/65 102/61 124/67  Pulse: 71 78 71 83  Resp: 16 16 16 16   Temp: 98.3 F (36.8 C) 98 F (36.7 C) 97.9 F (36.6 C) 98.2 F (36.8 C)  TempSrc: Oral Oral Oral Oral  SpO2: 99% 98% 100% 100%  Weight:      Height:        Intake/Output Summary (Last 24 hours) at 09/11/2023 1415 Last data filed at 09/11/2023 1045 Gross per 24 hour  Intake 480 ml  Output 400 ml  Net 80 ml   Filed Weights   09/09/23 2008  Weight: 87.1 kg    Examination:  General exam: Appears in nad Respiratory system: Clear to auscultation. Respiratory effort normal. Cardiovascular system: S1 & S2 heard, RRR. No JVD, murmurs, rubs, gallops or clicks. No pedal edema. Gastrointestinal system: Abdomen is nondistended, soft and nontender. No organomegaly or masses felt. Normal bowel sounds heard. Central nervous system: Alert and oriented. No focal neurological deficits. Extremities:no edema     Data Reviewed: I have personally reviewed following labs and imaging studies  CBC: Recent Labs  Lab 09/09/23 2022 09/11/23 0600  WBC 4.5 4.1  HGB 12.5 11.1*  HCT 35.9* 31.1*  MCV 84.5 82.9  PLT 108* 93*   Basic Metabolic Panel: Recent Labs  Lab 09/09/23 2022 09/11/23 0600  NA 136 131*  K 3.7 3.5  CL  107 103  CO2 19* 19*  GLUCOSE 93 88  BUN 12 18  CREATININE 0.82 1.26*  CALCIUM 8.8* 7.8*   GFR: Estimated Creatinine Clearance: 72.9 mL/min (A) (by C-G formula based on SCr of 1.26 mg/dL (H)). Liver Function Tests: Recent Labs  Lab 09/09/23 2022 09/11/23 0600  AST 24 27  ALT 13 13  ALKPHOS 67 54  BILITOT 0.9 1.0  PROT 6.8 6.3*  ALBUMIN 3.7 3.2*   Recent Labs  Lab 09/10/23 0104  LIPASE 38   No results for input(s): AMMONIA in the last 168 hours. Coagulation Profile: No results for input(s): INR, PROTIME in the last 168 hours. Cardiac Enzymes: No results for input(s): CKTOTAL, CKMB, CKMBINDEX, TROPONINI in the last 168 hours. BNP (last 3 results) No results for input(s): PROBNP in the last 8760 hours. HbA1C: No results for input(s): HGBA1C in the last 72 hours. CBG: Recent Labs  Lab 09/09/23 2312  GLUCAP 111*   Lipid Profile: No results for input(s): CHOL, HDL, LDLCALC, TRIG, CHOLHDL, LDLDIRECT in the last 72 hours. Thyroid Function Tests: No results for input(s): TSH, T4TOTAL, FREET4, T3FREE, THYROIDAB in the last 72 hours. Anemia Panel: No results for input(s): VITAMINB12, FOLATE, FERRITIN, TIBC, IRON , RETICCTPCT in the last 72 hours. Sepsis  Labs: No results for input(s): PROCALCITON, LATICACIDVEN in the last 168 hours.  No results found for this or any previous visit (from the past 240 hours).       Radiology Studies: VAS US  LOWER EXTREMITY VENOUS (DVT) Result Date: 09/11/2023  Lower Venous DVT Study Patient Name:  [facility] [place]  Date of Exam:   09/11/2023 Medical Rec #: [id number]         Accession #:    [id number] Date of Birth: [birth year]         Patient Gender: F Patient Age:   11 years Exam Location:  [facility] Procedure:      VAS US  LOWER EXTREMITY VENOUS (DVT) Referring Phys: DAVID ORTIZ --------------------------------------------------------------------------------  Indications:  Pulmonary embolism.  Risk Factors: Antiphospholipid antibody syndrome, Lupus anticoagulant disorder Hx of PE. Anticoagulation: Eliquis  prior to admission with questionable compliance. Limitations: Body habitus and poor ultrasound/tissue interface. Comparison Study: Previous exam on 02/26/2023 was negative for DVT Performing Technologist: Ezzie Potters RVT, RDMS  Examination Guidelines: A complete evaluation includes B-mode imaging, spectral Doppler, color Doppler, and power Doppler as needed of all accessible portions of each vessel. Bilateral testing is considered an integral part of a complete examination. Limited examinations for reoccurring indications may be performed as noted. The reflux portion of the exam is performed with the patient in reverse Trendelenburg.  +---------+---------------+---------+-----------+----------+-------------------+ RIGHT    CompressibilityPhasicitySpontaneityPropertiesThrombus Aging      +---------+---------------+---------+-----------+----------+-------------------+ CFV      Full           Yes      Yes                                      +---------+---------------+---------+-----------+----------+-------------------+ SFJ      Full                                                             +---------+---------------+---------+-----------+----------+-------------------+ FV Prox  Full           Yes      Yes                                      +---------+---------------+---------+-----------+----------+-------------------+ FV Mid   Full           Yes      Yes                                      +---------+---------------+---------+-----------+----------+-------------------+ FV DistalFull           Yes      Yes                                      +---------+---------------+---------+-----------+----------+-------------------+ PFV      Full                                                              +---------+---------------+---------+-----------+----------+-------------------+  POP      Full           No       Yes                                      +---------+---------------+---------+-----------+----------+-------------------+ PTV                                                   Not well visualized +---------+---------------+---------+-----------+----------+-------------------+ PERO     Full                                                             +---------+---------------+---------+-----------+----------+-------------------+   +---------+---------------+---------+-----------+----------+--------------+ LEFT     CompressibilityPhasicitySpontaneityPropertiesThrombus Aging +---------+---------------+---------+-----------+----------+--------------+ CFV      Full           Yes      Yes                                 +---------+---------------+---------+-----------+----------+--------------+ SFJ      Full                                                        +---------+---------------+---------+-----------+----------+--------------+ FV Prox  Full           Yes      Yes                                 +---------+---------------+---------+-----------+----------+--------------+ FV Mid   Full           Yes      Yes                                 +---------+---------------+---------+-----------+----------+--------------+ FV DistalFull           Yes      Yes                                 +---------+---------------+---------+-----------+----------+--------------+ PFV      Full                                                        +---------+---------------+---------+-----------+----------+--------------+ POP      Full           Yes      Yes                                 +---------+---------------+---------+-----------+----------+--------------+ PTV  Full                                                         +---------+---------------+---------+-----------+----------+--------------+ PERO     Full                                                        +---------+---------------+---------+-----------+----------+--------------+     Summary: BILATERAL: - No evidence of deep vein thrombosis seen in the lower extremities, bilaterally. -No evidence of popliteal cyst, bilaterally.   *See table(s) above for measurements and observations. Electronically signed by Lonni Gaskins MD on 09/11/2023 at 11:59:16 AM.    Final    ECHOCARDIOGRAM COMPLETE Result Date: 09/10/2023    ECHOCARDIOGRAM REPORT   Patient Name:   [facility] [place] Date of Exam: 09/10/2023 Medical Rec #:  [id number]        Height:       64.0 in Accession #:    [id number]       Weight:       192.0 lb Date of Birth:  [birth year]        BSA:          1.923 m Patient Age:    23 years         BP:           130/70 mmHg Patient Gender: F                HR:           76 bpm. Exam Location:  Inpatient Procedure: 2D Echo, Color Doppler and Cardiac Doppler (Both Spectral and Color            Flow Doppler were utilized during procedure). Indications:    Pulmonary emboli  History:        Patient has prior history of Echocardiogram examinations, most                 recent 02/24/2023. Pulmonary emboli.  Sonographer:    Benard Stallion Referring Phys: [id number] DAVID MANUEL ORTIZ IMPRESSIONS  1. LV function low normal to mildly reduced; EF 50.  2. Left ventricular ejection fraction, by estimation, is 50 to 55%. The left ventricle has low normal function. The left ventricle has no regional wall motion abnormalities. Left ventricular diastolic parameters were normal.  3. Right ventricular systolic function is normal. The right ventricular size is normal. Tricuspid regurgitation signal is inadequate for assessing PA pressure.  4. The mitral valve is normal in structure. Mild mitral valve regurgitation. No evidence of mitral stenosis.  5. The aortic valve is tricuspid. Aortic  valve regurgitation is not visualized. No aortic stenosis is present.  6. The inferior vena cava is normal in size with greater than 50% respiratory variability, suggesting right atrial pressure of 3 mmHg. FINDINGS  Left Ventricle: Left ventricular ejection fraction, by estimation, is 50 to 55%. The left ventricle has low normal function. The left ventricle has no regional wall motion abnormalities. The left ventricular internal cavity size was normal in size. There is no left ventricular hypertrophy. Left ventricular diastolic parameters were normal. Right Ventricle: The right ventricular size is normal. Right ventricular  systolic function is normal. Tricuspid regurgitation signal is inadequate for assessing PA pressure. The tricuspid regurgitant velocity is 1.83 m/s, and with an assumed right atrial  pressure of 3 mmHg, the estimated right ventricular systolic pressure is 16.4 mmHg. Left Atrium: Left atrial size was normal in size. Right Atrium: Right atrial size was normal in size. Pericardium: There is no evidence of pericardial effusion. Mitral Valve: The mitral valve is normal in structure. Mild mitral valve regurgitation. No evidence of mitral valve stenosis. Tricuspid Valve: The tricuspid valve is normal in structure. Tricuspid valve regurgitation is trivial. No evidence of tricuspid stenosis. Aortic Valve: The aortic valve is tricuspid. Aortic valve regurgitation is not visualized. No aortic stenosis is present. Aortic valve mean gradient measures 3.0 mmHg. Aortic valve peak gradient measures 5.3 mmHg. Aortic valve area, by VTI measures 3.06 cm. Pulmonic Valve: The pulmonic valve was normal in structure. Pulmonic valve regurgitation is trivial. No evidence of pulmonic stenosis. Aorta: The aortic root is normal in size and structure. Venous: The inferior vena cava is normal in size with greater than 50% respiratory variability, suggesting right atrial pressure of 3 mmHg. IAS/Shunts: No atrial level shunt  detected by color flow Doppler. Additional Comments: LV function low normal to mildly reduced; EF 50.  LEFT VENTRICLE PLAX 2D LVIDd:         4.50 cm   Diastology LVIDs:         2.90 cm   LV e' medial:  13.60 cm/s LV PW:         0.80 cm   LV e' lateral: 15.20 cm/s LV IVS:        0.80 cm LVOT diam:     2.20 cm LV SV:         72 LV SV Index:   38 LVOT Area:     3.80 cm  RIGHT VENTRICLE RV S prime:     6.96 cm/s TAPSE (M-mode): 1.5 cm LEFT ATRIUM           Index        RIGHT ATRIUM           Index LA diam:      2.70 cm 1.40 cm/m   RA Area:     11.50 cm LA Vol (A2C): 34.5 ml 17.94 ml/m  RA Volume:   25.10 ml  13.05 ml/m LA Vol (A4C): 38.1 ml 19.82 ml/m  AORTIC VALVE                    PULMONIC VALVE AV Area (Vmax):    3.04 cm     PR End Diast Vel: 2.57 msec AV Area (Vmean):   2.94 cm AV Area (VTI):     3.06 cm AV Vmax:           115.00 cm/s AV Vmean:          78.500 cm/s AV VTI:            0.236 m AV Peak Grad:      5.3 mmHg AV Mean Grad:      3.0 mmHg LVOT Vmax:         92.10 cm/s LVOT Vmean:        60.800 cm/s LVOT VTI:          0.190 m LVOT/AV VTI ratio: 0.81  AORTA Ao Root diam: 2.90 cm Ao Asc diam:  2.40 cm MR Peak grad: 47.9 mmHg   TRICUSPID VALVE MR Vmax:  346.00 cm/s TR Peak grad:   13.4 mmHg                           TR Vmax:        183.00 cm/s                            SHUNTS                           Systemic VTI:  0.19 m                           Systemic Diam: 2.20 cm Redell Shallow MD Electronically signed by Redell Shallow MD Signature Date/Time: 09/10/2023/2:40:01 PM    Final    CT Angio Chest PE W/Cm &/Or Wo Cm Result Date: 09/10/2023 CLINICAL DATA:  On 02/22/2023 the patient had a large left hemispheric MCA stroke, with weakness since then. Patient is only intermittently mobile with high risk for pulmonary embolism and history of right lower lobe PE in February 2024. She also presents with left lower quadrant abdominal pain. EXAM: CT ANGIOGRAPHY CHEST CT ABDOMEN AND PELVIS WITH CONTRAST  TECHNIQUE: Multidetector CT imaging of the chest was performed using the standard protocol during bolus administration of intravenous contrast. Multiplanar CT image reconstructions and MIPs were obtained to evaluate the vascular anatomy. Multidetector CT imaging of the abdomen and pelvis was performed using the standard protocol during bolus administration of intravenous contrast. RADIATION DOSE REDUCTION: This exam was performed according to the departmental dose-optimization program which includes automated exposure control, adjustment of the mA and/or kV according to patient size and/or use of iterative reconstruction technique. CONTRAST:  OMNIPAQUE  IOHEXOL  350 MG/ML SOLN COMPARISON:  CTA chest studies 04/08/2022, 04/13/2022 and 11/03/2021, CT abdomen contrast 03/26/2023. No prior pelvic CT. FINDINGS: CTA CHEST FINDINGS Cardiovascular: The pulmonary trunk is upper limit normal in caliber, unchanged. Mild cardiomegaly. The pulmonary veins are nondistended. There is a right lower lobe posterior basilar branch point subsegmental embolus on series 4 axial 65-67. Downstream from this multiple small posterior basal distal branches are unopacified compared to the left and probably also harbor  thrombus, but this was seen on the studies from February 2024 and probably chronic. Remainder of the pulmonary arteries appear free of thrombus. There were several lateral basal segmental and subsegmental emboli on the prior studies which are no longer seen. There is a small chronic anterior pericardial effusion. The aorta and great vessels are normal. Mediastinum/Nodes: No enlarged mediastinal, hilar, or axillary lymph nodes. Thyroid gland, trachea, and esophagus demonstrate no significant findings. Lungs/Pleura: Diffuse bronchial thickening. There is mild posterior atelectasis. Consolidation change previously in the right lower lobe basal segments has resolved. There is diffuse mosaicism of the lungs likely from air  trapping with small airways disease. There is no consolidation, effusion, pneumothorax or nodules. Musculoskeletal: No chest wall abnormality. No acute or significant osseous findings. Review of the MIP images confirms the above findings. CT ABDOMEN and PELVIS FINDINGS Hepatobiliary: No focal liver abnormality is seen. No gallstones, gallbladder wall thickening, or biliary dilatation. Pancreas: No abnormality. Spleen: No abnormality. 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 No adrenal or renal mass enhancement. No urinary stone or obstruction. Unremarkable bladder for the degree of distension. Stomach/Bowel: Since 03/26/2023, interval PEG tube Removal. No secondary complication is seen.  No GI tract dilatation or wall thickening. Normal appendix. Moderate retained stool ascending colon. No evidence of colitis or diverticulitis. Vascular/Lymphatic: No significant vascular findings are present. No enlarged abdominal or pelvic lymph nodes. Reproductive: Uterus and bilateral adnexa are unremarkable. Other: Mild rectus diastasis at the umbilicus and small umbilical fat hernia. No incarcerated hernia. No free fluid, free hemorrhage or free air. Musculoskeletal: No acute or significant osseous findings. Review of the MIP images confirms the above findings. IMPRESSION: 1. Right lower lobe posterior basilar branch point subsegmental embolus. Downstream from this multiple small posterior basal distal branches are unopacified compared to the left and probably also harbor  thrombus, but this was seen on the studies from February 2024 and probably chronic. The overall clot burden appears small. 2. No other evidence of acute PE. No findings of acute right heart strain. 3. Mild cardiomegaly. 4. 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 5. No acute findings in the abdomen or pelvis. 6. Constipation. 7. Small umbilical fat  hernia. 8. Interval PEG tube Removal since 03/26/2023. No secondary complication is seen. Electronically Signed   By: Francis Quam M.D.   On: 09/10/2023 06:26   CT ABDOMEN PELVIS W CONTRAST Result Date: 09/10/2023 CLINICAL DATA:  On 02/22/2023 the patient had a large left hemispheric MCA stroke, with weakness since then. Patient is only intermittently mobile with high risk for pulmonary embolism and history of right lower lobe PE in February 2024. She also presents with left lower quadrant abdominal pain. EXAM: CT ANGIOGRAPHY CHEST CT ABDOMEN AND PELVIS WITH CONTRAST TECHNIQUE: Multidetector CT imaging of the chest was performed using the standard protocol during bolus administration of intravenous contrast. Multiplanar CT image reconstructions and MIPs were obtained to evaluate the vascular anatomy. Multidetector CT imaging of the abdomen and pelvis was performed using the standard protocol during bolus administration of intravenous contrast. RADIATION DOSE REDUCTION: This exam was performed according to the departmental dose-optimization program which includes automated exposure control, adjustment of the mA and/or kV according to patient size and/or use of iterative reconstruction technique. CONTRAST:  OMNIPAQUE  IOHEXOL  350 MG/ML SOLN COMPARISON:  CTA chest studies 04/08/2022, 04/13/2022 and 11/03/2021, CT abdomen contrast 03/26/2023. No prior pelvic CT. FINDINGS: CTA CHEST FINDINGS Cardiovascular: The pulmonary trunk is upper limit normal in caliber, unchanged. Mild cardiomegaly. The pulmonary veins are nondistended. There is a right lower lobe posterior basilar branch point subsegmental embolus on series 4 axial 65-67. Downstream from this multiple small posterior basal distal branches are unopacified compared to the left and probably also harbor  thrombus, but this was seen on the studies from February 2024 and probably chronic. Remainder of the pulmonary arteries appear free of thrombus. There were  several lateral basal segmental and subsegmental emboli on the prior studies which are no longer seen. There is a small chronic anterior pericardial effusion. The aorta and great vessels are normal. Mediastinum/Nodes: No enlarged mediastinal, hilar, or axillary lymph nodes. Thyroid gland, trachea, and esophagus demonstrate no significant findings. Lungs/Pleura: Diffuse bronchial thickening. There is mild posterior atelectasis. Consolidation change previously in the right lower lobe basal segments has resolved. 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 There is no consolidation, effusion, pneumothorax or nodules. Musculoskeletal: No chest wall abnormality. No acute or significant osseous findings. Review of the MIP images confirms the above findings. CT ABDOMEN and PELVIS FINDINGS Hepatobiliary: No focal liver abnormality is seen. No gallstones, gallbladder wall thickening,  or biliary dilatation. Pancreas: No abnormality. Spleen: No abnormality. 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 No adrenal or renal mass enhancement. No urinary stone or obstruction. Unremarkable bladder for the degree of distension. Stomach/Bowel: Since 03/26/2023, interval PEG tube Removal. No secondary complication is seen. No GI tract dilatation or wall thickening. Normal appendix. Moderate retained stool ascending colon. No evidence of colitis or diverticulitis. Vascular/Lymphatic: No significant vascular findings are present. No enlarged abdominal or pelvic lymph nodes. Reproductive: Uterus and bilateral adnexa are unremarkable. Other: Mild rectus diastasis at the umbilicus and small umbilical fat hernia. No incarcerated hernia. No free fluid, free hemorrhage or free air. Musculoskeletal: No acute or significant osseous findings. Review of the MIP images confirms the above findings. IMPRESSION: 1. Right lower lobe posterior basilar branch point  subsegmental embolus. Downstream from this multiple small posterior basal distal branches are unopacified compared to the left and probably also harbor  thrombus, but this was seen on the studies from February 2024 and probably chronic. The overall clot burden appears small. 2. No other evidence of acute PE. No findings of acute right heart strain. 3. Mild cardiomegaly. 4. 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 5. No acute findings in the abdomen or pelvis. 6. Constipation. 7. Small umbilical fat hernia. 8. Interval PEG tube Removal since 03/26/2023. No secondary complication is seen. Electronically Signed   By: Francis Quam M.D.   On: 09/10/2023 06:26   CT Head Wo Contrast Result Date: 09/10/2023 CLINICAL DATA:  Headache EXAM: CT HEAD WITHOUT CONTRAST TECHNIQUE: Contiguous axial images were obtained from the base of the skull through the vertex without intravenous contrast. RADIATION DOSE REDUCTION: This exam was performed according to the departmental dose-optimization program which includes automated exposure control, adjustment of the mA and/or kV according to patient size and/or use of iterative reconstruction technique. 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 No acute intracranial abnormality. Specifically, no hemorrhage, hydrocephalus, mass lesion, acute infarction, or significant intracranial injury. Vascular: No hyperdense vessel or unexpected calcification. Skull: No acute calvarial abnormality. Sinuses/Orbits: No acute findings Other: None IMPRESSION: Stable left frontoparietal and temporal encephalomalacia. No acute intracranial abnormality. Electronically Signed   By: Franky Crease M.D.   On: 09/10/2023 01:17   DG Chest Port 1 View Result Date: 09/10/2023 CLINICAL DATA:  Chest pain.  Altered mental status.  Confusion. EXAM:  PORTABLE CHEST 1 VIEW COMPARISON:  03/26/2023 FINDINGS: The heart size and mediastinal contours are within normal limits. Both lungs are clear. The visualized skeletal structures are unremarkable. IMPRESSION: No active disease. Electronically Signed   By: Elsie Gravely M.D.   On: 09/10/2023 01:02        Scheduled Meds:  apixaban   10 mg Oral BID   Followed by   NOREEN ON 09/17/2023] apixaban   5 mg Oral BID   Continuous Infusions:   LOS: 0 days    Time spent: 30 min    Almarie KANDICE Hoots, MD  09/11/2023, 2:15 PM

## 2023-09-11 NOTE — Plan of Care (Signed)
  Problem: Health Behavior/Discharge Planning: Goal: Ability to manage health-related needs will improve Outcome: Progressing   Problem: Clinical Measurements: Goal: Ability to maintain clinical measurements within normal limits will improve Outcome: Progressing Goal: Will remain free from infection Outcome: Progressing Goal: Diagnostic test results will improve Outcome: Progressing Goal: Respiratory complications will improve Outcome: Progressing Goal: Cardiovascular complication will be avoided Outcome: Progressing   Problem: Activity: Goal: Risk for activity intolerance will decrease Outcome: Progressing   Problem: Nutrition: Goal: Adequate nutrition will be maintained Outcome: Progressing   Problem: Coping: Goal: Level of anxiety will decrease Outcome: Progressing   Problem: Pain Managment: Goal: General experience of comfort will improve and/or be controlled Outcome: Progressing   Problem: Safety: Goal: Ability to remain free from injury will improve Outcome: Progressing   Problem: Skin Integrity: Goal: Risk for impaired skin integrity will decrease Outcome: Progressing

## 2023-09-12 ENCOUNTER — Telehealth (HOSPITAL_COMMUNITY): Payer: Self-pay | Admitting: Pharmacy Technician

## 2023-09-12 ENCOUNTER — Other Ambulatory Visit (HOSPITAL_COMMUNITY): Payer: Self-pay

## 2023-09-12 ENCOUNTER — Inpatient Hospital Stay (HOSPITAL_COMMUNITY)

## 2023-09-12 DIAGNOSIS — R7989 Other specified abnormal findings of blood chemistry: Secondary | ICD-10-CM

## 2023-09-12 LAB — CBC WITH DIFFERENTIAL/PLATELET
Abs Immature Granulocytes: 0.02 K/uL (ref 0.00–0.07)
Basophils Absolute: 0 K/uL (ref 0.0–0.1)
Basophils Relative: 0 %
Eosinophils Absolute: 0 K/uL (ref 0.0–0.5)
Eosinophils Relative: 1 %
HCT: 31.7 % — ABNORMAL LOW (ref 36.0–46.0)
Hemoglobin: 11 g/dL — ABNORMAL LOW (ref 12.0–15.0)
Immature Granulocytes: 0 %
Lymphocytes Relative: 29 %
Lymphs Abs: 1.3 K/uL (ref 0.7–4.0)
MCH: 29.4 pg (ref 26.0–34.0)
MCHC: 34.7 g/dL (ref 30.0–36.0)
MCV: 84.8 fL (ref 80.0–100.0)
Monocytes Absolute: 0.5 K/uL (ref 0.1–1.0)
Monocytes Relative: 11 %
Neutro Abs: 2.6 K/uL (ref 1.7–7.7)
Neutrophils Relative %: 59 %
Platelets: 98 K/uL — ABNORMAL LOW (ref 150–400)
RBC: 3.74 MIL/uL — ABNORMAL LOW (ref 3.87–5.11)
RDW: 13.2 % (ref 11.5–15.5)
WBC: 4.5 K/uL (ref 4.0–10.5)
nRBC: 0 % (ref 0.0–0.2)

## 2023-09-12 LAB — COMPREHENSIVE METABOLIC PANEL WITH GFR
ALT: 12 U/L (ref 0–44)
AST: 17 U/L (ref 15–41)
Albumin: 3.3 g/dL — ABNORMAL LOW (ref 3.5–5.0)
Alkaline Phosphatase: 65 U/L (ref 38–126)
Anion gap: 6 (ref 5–15)
BUN: 16 mg/dL (ref 6–20)
CO2: 24 mmol/L (ref 22–32)
Calcium: 8.6 mg/dL — ABNORMAL LOW (ref 8.9–10.3)
Chloride: 110 mmol/L (ref 98–111)
Creatinine, Ser: 1.07 mg/dL — ABNORMAL HIGH (ref 0.44–1.00)
GFR, Estimated: >60 mL/min (ref >60)
Glucose, Bld: 83 mg/dL (ref 70–99)
Potassium: 3.7 mmol/L (ref 3.5–5.1)
Sodium: 140 mmol/L (ref 135–145)
Total Bilirubin: 0.6 mg/dL (ref 0.0–1.2)
Total Protein: 6.4 g/dL — ABNORMAL LOW (ref 6.5–8.1)

## 2023-09-12 MED ORDER — MELATONIN 5 MG PO TABS
5.0000 mg | ORAL_TABLET | Freq: Every evening | ORAL | Status: AC | PRN
  Administered 2023-09-12 – 2023-09-15 (×3): 5 mg via ORAL
  Filled 2023-09-12 (×3): qty 1

## 2023-09-12 MED ORDER — DIPHENHYDRAMINE HCL 50 MG/ML IJ SOLN
12.5000 mg | Freq: Once | INTRAMUSCULAR | Status: AC
  Administered 2023-09-12: 12.5 mg via INTRAVENOUS
  Filled 2023-09-12: qty 1

## 2023-09-12 MED ORDER — PROCHLORPERAZINE EDISYLATE 10 MG/2ML IJ SOLN
10.0000 mg | Freq: Once | INTRAMUSCULAR | Status: AC
  Administered 2023-09-12: 10 mg via INTRAVENOUS
  Filled 2023-09-12: qty 2

## 2023-09-12 MED ORDER — HYDROMORPHONE HCL 1 MG/ML IJ SOLN
0.5000 mg | Freq: Four times a day (QID) | INTRAMUSCULAR | Status: DC | PRN
  Administered 2023-09-12 – 2023-09-16 (×14): 0.5 mg via INTRAVENOUS
  Filled 2023-09-12 (×15): qty 0.5

## 2023-09-12 MED ORDER — SUMATRIPTAN SUCCINATE 25 MG PO TABS
25.0000 mg | ORAL_TABLET | ORAL | Status: AC | PRN
  Administered 2023-09-12 (×2): 25 mg via ORAL
  Filled 2023-09-12 (×4): qty 1

## 2023-09-12 MED ORDER — KETOROLAC TROMETHAMINE 15 MG/ML IJ SOLN
30.0000 mg | Freq: Once | INTRAMUSCULAR | Status: AC
  Administered 2023-09-12: 30 mg via INTRAVENOUS
  Filled 2023-09-12: qty 2

## 2023-09-12 NOTE — Telephone Encounter (Signed)
Patient Product/process development scientist completed.    The patient is insured through [facility] MEDICA[employer].     Ran test claim for Eliquis 5 mg and the current 30 day co-pay is $4.00.   This test claim was processed through [facility]- copay amounts may vary at other pharmacies due to pharmacy/plan contracts, or as the patient moves through the different stages of their insurance plan.     Roland Earl, CPHT Pharmacy Technician III Certified Patient Advocate [facility] Patient Advocate Team Direct Number: [phone]  Fax: [phone]

## 2023-09-12 NOTE — Plan of Care (Signed)
  Problem: Health Behavior/Discharge Planning: Goal: Ability to manage health-related needs will improve Outcome: Progressing   Problem: Clinical Measurements: Goal: Ability to maintain clinical measurements within normal limits will improve Outcome: Progressing Goal: Will remain free from infection Outcome: Progressing Goal: Diagnostic test results will improve Outcome: Progressing Goal: Respiratory complications will improve Outcome: Progressing Goal: Cardiovascular complication will be avoided Outcome: Progressing   Problem: Nutrition: Goal: Adequate nutrition will be maintained Outcome: Progressing   Problem: Coping: Goal: Level of anxiety will decrease Outcome: Progressing   Problem: Activity: Goal: Risk for activity intolerance will decrease Outcome: Progressing   Problem: Pain Managment: Goal: General experience of comfort will improve and/or be controlled Outcome: Progressing   Problem: Skin Integrity: Goal: Risk for impaired skin integrity will decrease Outcome: Progressing   Problem: Safety: Goal: Ability to remain free from injury will improve Outcome: Progressing

## 2023-09-12 NOTE — Progress Notes (Signed)
SATURATION QUALIFICATIONS: (This note is used to comply with regulatory documentation for home oxygen)  Patient Saturations on Room Air at Rest =99%  Patient Saturations on Room Air while Ambulating =96%  Patient Saturations on 0 Liters of oxygen while Ambulating =96%  Please briefly explain why patient needs home oxygen: 

## 2023-09-12 NOTE — Plan of Care (Signed)
  Problem: Health Behavior/Discharge Planning: Goal: Ability to manage health-related needs will improve Outcome: Progressing   Problem: Clinical Measurements: Goal: Ability to maintain clinical measurements within normal limits will improve Outcome: Progressing   Problem: Activity: Goal: Risk for activity intolerance will decrease Outcome: Progressing   Problem: Safety: Goal: Ability to remain free from injury will improve Outcome: Progressing   

## 2023-09-12 NOTE — Progress Notes (Addendum)
 PROGRESS NOTE    Jacqueline [name]  FMW:[id number] DOB: 2000-07-30 DOA: 09/09/2023 PCP: Tonnie Raisin, MD   Brief Narrative:  23 y.o. 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  She has also had chest pain and mild dyspnea.  She denied fever, chills, rhinorrhea, sore throat, wheezing or hemoptysis. No palpitations, diaphoresis, PND, orthopnea or pitting edema of the lower extremities.  No abdominal pain, nausea, emesis, diarrhea, constipation, melena or hematochezia.  No flank pain, dysuria, frequency or hematuria.  No polyuria, polydipsia, polyphagia or blurred vision.    Lab work: Urinalysis with moderate hemoglobin proteinuria of 100 mg/dL.  CBC showed a Gapinski count of 4.8, hemoglobin 12.5 g/dL and platelets 891.  CMP showed a CO2 of 19 mmol/L with a normal anion gap, the rest of the measurements were normal after calcium  correction.  Serum pregnancy test was negative.  Lipase was normal.  Troponin was 35 and 31 ng/L.   Imaging: Portable 1 view chest radiograph with no active disease.  CT head without contrast with no acute intracranial abnormality.  There is stable left frontoparietal and temporal encephalomalacia.  CTA chest showing a right lower lobe posterior basilar branch point subsegmental embolus.  Downstream from this multiple small posterior basal distal branches are opacified compared to the left and probably also harbor  thrombus, this was seen on the studies from February 2024 and probably chronic.  No other evidence of PE.  No heart strain.  Mild cardiomegaly.  Diffuse bronchial thickening likely from air trapping with  small airways disease.  CT abdomen/pelvis with contrast no acute findings in the abdomen or pelvis there was constipation, small umbilical hernia.  Interval removal of PEG tube.   ED course: Initial vital signs were temperature 97.8 F, pulse 93, respiration 20, BP 130/77 mmHg O2 sat 100% on room air.  Patient received Benadryl  12.5 mg IVP, gabapentin  100 mg p.o. x 1, metoclopramide  10 mg IVP Compazine  10 mg IVP.    Assessment & Plan:   Principal Problem:   Pulmonary emboli (HCC) Active Problems:   Left middle cerebral artery stroke (HCC)   Thrombocytopenia (HCC)   Antiphospholipid antibody syndrome (HCC)   Class 1 obesity   Elevated troponin      Pulmonary emboli (HCC) With previous prior history In the setting of:   Antiphospholipid antibody syndrome (HCC) The patient has not used apixaban  in 7 days or more apixaban  5 mg p.o. twice daily. Check echocardiogram -LV function low normal to mildly reduced; EF 50.   Left ventricular ejection fraction, by estimation, is 50 to 55%. The  left ventricle has low normal function. The left ventricle has no regional  wall motion abnormalities. Left ventricular diastolic parameters were  normal. Right ventricular systolic function is normal. The right ventricular  size is normal. Tricuspid regurgitation signal is inadequate for assessing  PA pressure. The mitral valve is normal in structure. Mild mitral valve  regurgitation. No evidence of mitral stenosis. The aortic valve is tricuspid. Aortic valve regurgitation is not  visualized. No aortic stenosis is present.  The inferior vena cava is normal in size with greater than 50%  respiratory variability, suggesting right atrial pressure of 3 mmHg.  Check lower extremities Doppler no dvt   Active Problems:  Elevated troponin Likely demand ischemia. No heart strain on CTA or echo.   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 Supportive care.     Thrombocytopenia (HCC) Monitor platelet count.     Class 1 obesity Current BMI 32.96 kg/m. Would benefit from lifestyle modifications. Follow-up closely with PCP.    Estimated body mass index is 32.96 kg/m as calculated from the following:   Height as of this encounter: 5' 4 (1.626 m).   Weight as of this encounter: 87.1 kg.  DVT prophylaxis: eliquis  Code Status: full Family Communication: Disposition Plan:  Status is: Inpatient Remains inpatient appropriate because: PE   Consultants: NONE  Procedures: none Antimicrobials: none  Subjective:  C/o pleuritic chest pain Has NO PLACE TO GO TODAY TOMORROW SISTER AND PICK HER UP AND GO TO ANOTHER SISTERS HOUSE DENIES HEAD ACHE  Objective: Vitals:   09/11/23 0457 09/11/23 1347 09/11/23 2055 09/12/23 0608  BP: 102/61 124/67 102/63 (!) 107/59  Pulse: 71 83 77 80  Resp: 16 16 16 16   Temp: 97.9 F (36.6 C) 98.2 F (36.8 C) 97.8 F (36.6 C) 98.1 F (36.7 C)  TempSrc: Oral Axillary Oral Oral  SpO2: 100% 100% 92% 99%  Weight:      Height:        Intake/Output Summary (Last 24 hours) at 09/12/2023 0932 Last data filed at 09/12/2023 0021 Gross per 24 hour  Intake 960 ml  Output 400 ml  Net 560 ml   Filed Weights   09/09/23 2008  Weight: 87.1 kg    Examination:  General exam: Appears in nad Respiratory system: Clear to auscultation. Respiratory effort normal. Cardiovascular system: S1 & S2 heard, RRR. No JVD, murmurs, rubs, gallops or clicks. No pedal edema. Gastrointestinal system: Abdomen is nondistended, soft and nontender. No organomegaly or masses felt. Normal bowel sounds heard. Central nervous system: Alert and oriented. No focal neurological deficits. Extremities:no edema     Data Reviewed: I have personally reviewed following labs and imaging studies  CBC: Recent Labs  Lab 09/09/23 2022 09/11/23 0600  WBC 4.5 4.1  HGB 12.5 11.1*  HCT 35.9* 31.1*  MCV 84.5 82.9   PLT 108* 93*   Basic Metabolic Panel: Recent Labs  Lab 09/09/23 2022 09/11/23 0600  NA 136 131*  K 3.7 3.5  CL 107 103  CO2 19* 19*  GLUCOSE 93 88  BUN 12 18  CREATININE 0.82 1.26*  CALCIUM  8.8* 7.8*   GFR: Estimated Creatinine Clearance: 72.9 mL/min (A) (by C-G formula based on SCr of 1.26 mg/dL (H)). Liver Function Tests: Recent Labs  Lab 09/09/23 2022 09/11/23 0600  AST 24 27  ALT 13 13  ALKPHOS 67 54  BILITOT 0.9 1.0  PROT 6.8 6.3*  ALBUMIN 3.7 3.2*   Recent Labs  Lab 09/10/23 0104  LIPASE 38   No results for input(s): AMMONIA in the last 168 hours. Coagulation Profile: No results for input(s): INR, PROTIME in the last 168 hours. Cardiac Enzymes: No results for input(s): CKTOTAL, CKMB, CKMBINDEX, TROPONINI in the last 168 hours. BNP (last 3 results) No results for input(s): PROBNP in the last 8760 hours. HbA1C: No results for input(s): HGBA1C in the last 72 hours. CBG: Recent Labs  Lab 09/09/23 2312  GLUCAP 111*   Lipid Profile: No results for input(s): CHOL, HDL, LDLCALC, TRIG, CHOLHDL, LDLDIRECT in the last 72 hours. Thyroid Function Tests: No results for input(s): TSH, T4TOTAL, FREET4, T3FREE, THYROIDAB in  the last 72 hours. Anemia Panel: No results for input(s): VITAMINB12, FOLATE, FERRITIN, TIBC, IRON , RETICCTPCT in the last 72 hours. Sepsis Labs: No results for input(s): PROCALCITON, LATICACIDVEN in the last 168 hours.  No results found for this or any previous visit (from the past 240 hours).       Radiology Studies: VAS US  LOWER EXTREMITY VENOUS (DVT) Result Date: 09/11/2023  Lower Venous DVT Study Patient Name:  Jacqueline [name]  Date of Exam:   09/11/2023 Medical Rec #: [id number]         Accession #:    [id number] Date of Birth: 2000/12/12         Patient Gender: F Patient Age:   36 years Exam Location:  [facility] Procedure:      VAS US  LOWER EXTREMITY VENOUS (DVT)  Referring Phys: DAVID ORTIZ --------------------------------------------------------------------------------  Indications: Pulmonary embolism.  Risk Factors: Antiphospholipid antibody syndrome, Lupus anticoagulant disorder Hx of PE. Anticoagulation: Eliquis  prior to admission with questionable compliance. Limitations: Body habitus and poor ultrasound/tissue interface. Comparison Study: Previous exam on 02/26/2023 was negative for DVT Performing Technologist: Ezzie Potters RVT, RDMS  Examination Guidelines: A complete evaluation includes B-mode imaging, spectral Doppler, color Doppler, and power Doppler as needed of all accessible portions of each vessel. Bilateral testing is considered an integral part of a complete examination. Limited examinations for reoccurring indications may be performed as noted. The reflux portion of the exam is performed with the patient in reverse Trendelenburg.  +---------+---------------+---------+-----------+----------+-------------------+ RIGHT    CompressibilityPhasicitySpontaneityPropertiesThrombus Aging      +---------+---------------+---------+-----------+----------+-------------------+ CFV      Full           Yes      Yes                                      +---------+---------------+---------+-----------+----------+-------------------+ SFJ      Full                                                             +---------+---------------+---------+-----------+----------+-------------------+ FV Prox  Full           Yes      Yes                                      +---------+---------------+---------+-----------+----------+-------------------+ FV Mid   Full           Yes      Yes                                      +---------+---------------+---------+-----------+----------+-------------------+ FV DistalFull           Yes      Yes                                       +---------+---------------+---------+-----------+----------+-------------------+ PFV      Full                                                             +---------+---------------+---------+-----------+----------+-------------------+  POP      Full           No       Yes                                      +---------+---------------+---------+-----------+----------+-------------------+ PTV                                                   Not well visualized +---------+---------------+---------+-----------+----------+-------------------+ PERO     Full                                                             +---------+---------------+---------+-----------+----------+-------------------+   +---------+---------------+---------+-----------+----------+--------------+ LEFT     CompressibilityPhasicitySpontaneityPropertiesThrombus Aging +---------+---------------+---------+-----------+----------+--------------+ CFV      Full           Yes      Yes                                 +---------+---------------+---------+-----------+----------+--------------+ SFJ      Full                                                        +---------+---------------+---------+-----------+----------+--------------+ FV Prox  Full           Yes      Yes                                 +---------+---------------+---------+-----------+----------+--------------+ FV Mid   Full           Yes      Yes                                 +---------+---------------+---------+-----------+----------+--------------+ FV DistalFull           Yes      Yes                                 +---------+---------------+---------+-----------+----------+--------------+ PFV      Full                                                        +---------+---------------+---------+-----------+----------+--------------+ POP      Full           Yes      Yes                                  +---------+---------------+---------+-----------+----------+--------------+ PTV  Full                                                        +---------+---------------+---------+-----------+----------+--------------+ PERO     Full                                                        +---------+---------------+---------+-----------+----------+--------------+     Summary: BILATERAL: - No evidence of deep vein thrombosis seen in the lower extremities, bilaterally. -No evidence of popliteal cyst, bilaterally.   *See table(s) above for measurements and observations. Electronically signed by Lonni Gaskins MD on 09/11/2023 at 11:59:16 AM.    Final    ECHOCARDIOGRAM COMPLETE Result Date: 09/10/2023    ECHOCARDIOGRAM REPORT   Patient Name:   Jacqueline [name] Date of Exam: 09/10/2023 Medical Rec #:  [id number]        Height:       64.0 in Accession #:    [id number]       Weight:       192.0 lb Date of Birth:  [birth year]        BSA:          1.923 m Patient Age:    23 years         BP:           130/70 mmHg Patient Gender: F                HR:           76 bpm. Exam Location:  Inpatient Procedure: 2D Echo, Color Doppler and Cardiac Doppler (Both Spectral and Color            Flow Doppler were utilized during procedure). Indications:    Pulmonary emboli  History:        Patient has prior history of Echocardiogram examinations, most                 recent 02/24/2023. Pulmonary emboli.  Sonographer:    Benard Stallion Referring Phys: [id number] DAVID MANUEL ORTIZ IMPRESSIONS  1. LV function low normal to mildly reduced; EF 50.  2. Left ventricular ejection fraction, by estimation, is 50 to 55%. The left ventricle has low normal function. The left ventricle has no regional wall motion abnormalities. Left ventricular diastolic parameters were normal.  3. Right ventricular systolic function is normal. The right ventricular size is normal. Tricuspid regurgitation signal is inadequate for assessing PA pressure.   4. The mitral valve is normal in structure. Mild mitral valve regurgitation. No evidence of mitral stenosis.  5. The aortic valve is tricuspid. Aortic valve regurgitation is not visualized. No aortic stenosis is present.  6. The inferior vena cava is normal in size with greater than 50% respiratory variability, suggesting right atrial pressure of 3 mmHg. FINDINGS  Left Ventricle: Left ventricular ejection fraction, by estimation, is 50 to 55%. The left ventricle has low normal function. The left ventricle has no regional wall motion abnormalities. The left ventricular internal cavity size was normal in size. There is no left ventricular hypertrophy. Left ventricular diastolic parameters were normal. Right Ventricle: The right ventricular size is normal. Right ventricular systolic  function is normal. Tricuspid regurgitation signal is inadequate for assessing PA pressure. The tricuspid regurgitant velocity is 1.83 m/s, and with an assumed right atrial  pressure of 3 mmHg, the estimated right ventricular systolic pressure is 16.4 mmHg. Left Atrium: Left atrial size was normal in size. Right Atrium: Right atrial size was normal in size. Pericardium: There is no evidence of pericardial effusion. Mitral Valve: The mitral valve is normal in structure. Mild mitral valve regurgitation. No evidence of mitral valve stenosis. Tricuspid Valve: The tricuspid valve is normal in structure. Tricuspid valve regurgitation is trivial. No evidence of tricuspid stenosis. Aortic Valve: The aortic valve is tricuspid. Aortic valve regurgitation is not visualized. No aortic stenosis is present. Aortic valve mean gradient measures 3.0 mmHg. Aortic valve peak gradient measures 5.3 mmHg. Aortic valve area, by VTI measures 3.06 cm. Pulmonic Valve: The pulmonic valve was normal in structure. Pulmonic valve regurgitation is trivial. No evidence of pulmonic stenosis. Aorta: The aortic root is normal in size and structure. Venous: The inferior vena  cava is normal in size with greater than 50% respiratory variability, suggesting right atrial pressure of 3 mmHg. IAS/Shunts: No atrial level shunt detected by color flow Doppler. Additional Comments: LV function low normal to mildly reduced; EF 50.  LEFT VENTRICLE PLAX 2D LVIDd:         4.50 cm   Diastology LVIDs:         2.90 cm   LV e' medial:  13.60 cm/s LV PW:         0.80 cm   LV e' lateral: 15.20 cm/s LV IVS:        0.80 cm LVOT diam:     2.20 cm LV SV:         72 LV SV Index:   38 LVOT Area:     3.80 cm  RIGHT VENTRICLE RV S prime:     6.96 cm/s TAPSE (M-mode): 1.5 cm LEFT ATRIUM           Index        RIGHT ATRIUM           Index LA diam:      2.70 cm 1.40 cm/m   RA Area:     11.50 cm LA Vol (A2C): 34.5 ml 17.94 ml/m  RA Volume:   25.10 ml  13.05 ml/m LA Vol (A4C): 38.1 ml 19.82 ml/m  AORTIC VALVE                    PULMONIC VALVE AV Area (Vmax):    3.04 cm     PR End Diast Vel: 2.57 msec AV Area (Vmean):   2.94 cm AV Area (VTI):     3.06 cm AV Vmax:           115.00 cm/s AV Vmean:          78.500 cm/s AV VTI:            0.236 m AV Peak Grad:      5.3 mmHg AV Mean Grad:      3.0 mmHg LVOT Vmax:         92.10 cm/s LVOT Vmean:        60.800 cm/s LVOT VTI:          0.190 m LVOT/AV VTI ratio: 0.81  AORTA Ao Root diam: 2.90 cm Ao Asc diam:  2.40 cm MR Peak grad: 47.9 mmHg   TRICUSPID VALVE MR Vmax:  346.00 cm/s TR Peak grad:   13.4 mmHg                           TR Vmax:        183.00 cm/s                            SHUNTS                           Systemic VTI:  0.19 m                           Systemic Diam: 2.20 cm Redell Shallow MD Electronically signed by Redell Shallow MD Signature Date/Time: 09/10/2023/2:40:01 PM    Final         Scheduled Meds:  apixaban   10 mg Oral BID   Followed by   NOREEN ON 09/17/2023] apixaban   5 mg Oral BID   Continuous Infusions:   LOS: 1 day    Time spent: 30 min    Jacqueline KANDICE Hoots, MD  09/12/2023, 9:32 AM

## 2023-09-13 ENCOUNTER — Inpatient Hospital Stay (HOSPITAL_COMMUNITY)

## 2023-09-13 DIAGNOSIS — I2609 Other pulmonary embolism with acute cor pulmonale: Secondary | ICD-10-CM | POA: Diagnosis not present

## 2023-09-13 MED ORDER — ZOLPIDEM TARTRATE 5 MG PO TABS
5.0000 mg | ORAL_TABLET | Freq: Every day | ORAL | Status: DC
  Administered 2023-09-13 – 2023-09-15 (×3): 5 mg via ORAL
  Filled 2023-09-13 (×3): qty 1

## 2023-09-13 MED ORDER — CLONAZEPAM 0.5 MG PO TABS
0.5000 mg | ORAL_TABLET | Freq: Every day | ORAL | Status: DC
  Administered 2023-09-13: 0.5 mg via ORAL
  Filled 2023-09-13: qty 1

## 2023-09-13 MED ORDER — GADOBUTROL 1 MMOL/ML IV SOLN
7.0000 mL | Freq: Once | INTRAVENOUS | Status: AC | PRN
  Administered 2023-09-13: 7 mL via INTRAVENOUS

## 2023-09-13 MED ORDER — GABAPENTIN 100 MG PO CAPS
100.0000 mg | ORAL_CAPSULE | Freq: Three times a day (TID) | ORAL | Status: DC
  Administered 2023-09-13 – 2023-09-15 (×8): 100 mg via ORAL
  Filled 2023-09-13 (×8): qty 1

## 2023-09-13 MED ORDER — HYDROMORPHONE HCL 1 MG/ML IJ SOLN
0.5000 mg | Freq: Once | INTRAMUSCULAR | Status: AC
  Administered 2023-09-13: 0.5 mg via INTRAVENOUS
  Filled 2023-09-13: qty 0.5

## 2023-09-13 NOTE — Plan of Care (Signed)
 Pt having continual pain In head. PRN medications given and still having pain. Provider notified of constant headache and this nurse recommended MRI (See orders). Pt a&ox4 and care continues. Problem: Activity: Goal: Risk for activity intolerance will decrease Outcome: Progressing   Problem: Nutrition: Goal: Adequate nutrition will be maintained Outcome: Progressing   Problem: Coping: Goal: Level of anxiety will decrease Outcome: Progressing   Problem: Pain Managment: Goal: General experience of comfort will improve and/or be controlled Outcome: Not Progressing

## 2023-09-13 NOTE — Progress Notes (Signed)
 PROGRESS NOTE    Jacqueline [name]  FMW:[id number] DOB: 2000/09/26 DOA: 09/09/2023 PCP: Tonnie Raisin, MD   Brief Narrative:  23 y.o. 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  She has also had chest pain and mild dyspnea.  She denied fever, chills, rhinorrhea, sore throat, wheezing or hemoptysis. No palpitations, diaphoresis, PND, orthopnea or pitting edema of the lower extremities.  No abdominal pain, nausea, emesis, diarrhea, constipation, melena or hematochezia.  No flank pain, dysuria, frequency or hematuria.  No polyuria, polydipsia, polyphagia or blurred vision.    Lab work: Urinalysis with moderate hemoglobin proteinuria of 100 mg/dL.  CBC showed a [place] count of 4.8, hemoglobin 12.5 g/dL and platelets 891.  CMP showed a CO2 of 19 mmol/L with a normal anion gap, the rest of the measurements were normal after calcium  correction.  Serum pregnancy test was negative.  Lipase was normal.  Troponin was 35 and 31 ng/L.   Imaging: Portable 1 view chest radiograph with no active disease.  CT head without contrast with no acute intracranial abnormality.  There is stable left frontoparietal and temporal encephalomalacia.  CTA chest showing a right lower lobe posterior basilar branch point subsegmental embolus.  Downstream from this multiple small posterior basal distal branches are opacified compared to the left and probably also harbor  thrombus, this was seen on the studies from February 2024 and probably chronic.  No other evidence of PE.  No heart strain.  Mild cardiomegaly.  Diffuse bronchial thickening likely from air trapping with  small airways disease.  CT abdomen/pelvis with contrast no acute findings in the abdomen or pelvis there was constipation, small umbilical hernia.  Interval removal of PEG tube.   ED course: Initial vital signs were temperature 97.8 F, pulse 93, respiration 20, BP 130/77 mmHg O2 sat 100% on room air.  Patient received Benadryl  12.5 mg IVP, gabapentin  100 mg p.o. x 1, metoclopramide  10 mg IVP Compazine  10 mg IVP.    Assessment & Plan:   Principal Problem:   Pulmonary emboli (HCC) Active Problems:   Left middle cerebral artery stroke (HCC)   Thrombocytopenia (HCC)   Antiphospholipid antibody syndrome (HCC)   Class 1 obesity   Elevated troponin      Pulmonary emboli (HCC) With previous prior history In the setting of:   Antiphospholipid antibody syndrome (HCC) The patient has not used apixaban  in 7 days or more apixaban  5 mg p.o. twice daily. Check echocardiogram -LV function low normal to mildly reduced; EF 50.   Left ventricular ejection fraction, by estimation, is 50 to 55%. The  left ventricle has low normal function. The left ventricle has no regional  wall motion abnormalities. Left ventricular diastolic parameters were  normal. Right ventricular systolic function is normal. The right ventricular  size is normal. Tricuspid regurgitation signal is inadequate for assessing  PA pressure. The mitral valve is normal in structure. Mild mitral valve  regurgitation. No evidence of mitral stenosis. The aortic valve is tricuspid. Aortic valve regurgitation is not  visualized. No aortic stenosis is present.  The inferior vena cava is normal in size with greater than 50%  respiratory variability, suggesting right atrial pressure of 3 mmHg.  Check lower extremities Doppler no dvt    Headache/migraines-she  has been reporting 10 out of 10 headache unable to sleep.  Looking through her records she saw neurologist last month and was diagnosed with poststroke headache and was placed on  gabapentin  100 mg 3 times a day.  This has been restarted.  However since she has not taken her Eliquis  for a week or so and was admitted with new PE I am concerned that we need to rule out any kind of thrombosis in her brain.  MRI of the brain pending.  Active Problems:   Elevated troponin Likely demand ischemia. No heart strain on CTA or echo.   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 Supportive care.     Thrombocytopenia (HCC) Monitor platelet count.     Class 1 obesity Current BMI 32.96 kg/m. Would benefit from lifestyle modifications. Follow-up closely with PCP.    Estimated body mass index is 32.96 kg/m as calculated from the following:   Height as of this encounter: 5' 4 (1.626 m).   Weight as of this encounter: 87.1 kg.  DVT prophylaxis: eliquis  Code Status: full Family Communication: Disposition Plan:  Status is: Inpatient Remains inpatient appropriate because: PE   Consultants: NONE  Procedures: none Antimicrobials: none  Subjective:  Complaining of severe headache did not sleep well Complained of 10 out of 10 abdominal pain x-ray KUB and x-ray is unremarkable  Objective: Vitals:   09/12/23 0608 09/12/23 1431 09/12/23 1945 09/13/23 0703  BP: (!) 107/59 106/63 103/64 102/73  Pulse: 80 67 73 71  Resp: 16 16 15 16   Temp: 98.1 F (36.7 C) 98 F (36.7 C) 97.7 F (36.5 C) 98 F (36.7 C)  TempSrc: Oral Oral Oral Oral  SpO2: 99% 98% 100% 100%  Weight:      Height:        Intake/Output Summary (Last 24 hours) at 09/13/2023 1109 Last data filed at 09/13/2023 9281 Gross per 24 hour  Intake 120 ml  Output 300 ml  Net -180 ml   Filed Weights   09/09/23 2008  Weight: 87.1 kg    Examination:  General exam: Appears in nad Respiratory system: Clear to auscultation. Respiratory effort normal. Cardiovascular system: S1 & S2 heard, RRR. No JVD, murmurs, rubs, gallops or clicks. No pedal  edema. Gastrointestinal system: Abdomen is nondistended, soft and nontender. No organomegaly or masses felt. Normal bowel sounds heard. Central nervous system: Alert and oriented.  Right upper and lower extremity weakness. Extremities:no edema     Data Reviewed: I have personally reviewed following labs and imaging studies  CBC: Recent Labs  Lab 09/09/23 2022 09/11/23 0600 09/12/23 1901  WBC 4.5 4.1 4.5  NEUTROABS  --   --  2.6  HGB 12.5 11.1* 11.0*  HCT 35.9* 31.1* 31.7*  MCV 84.5 82.9 84.8  PLT 108* 93* 98*   Basic Metabolic Panel: Recent Labs  Lab 09/09/23 2022 09/11/23 0600 09/12/23 1901  NA 136 131* 140  K 3.7 3.5 3.7  CL 107 103 110  CO2 19* 19* 24  GLUCOSE 93 88 83  BUN 12 18 16   CREATININE 0.82 1.26* 1.07*  CALCIUM  8.8* 7.8* 8.6*   GFR: Estimated Creatinine Clearance: 85.9 mL/min (A) (by C-G formula based on SCr of 1.07 mg/dL (H)). Liver Function Tests: Recent Labs  Lab 09/09/23 2022 09/11/23 0600 09/12/23 1901  AST 24 27 17   ALT 13 13 12   ALKPHOS 67 54 65  BILITOT 0.9 1.0 0.6  PROT 6.8 6.3*  6.4*  ALBUMIN 3.7 3.2* 3.3*   Recent Labs  Lab 09/10/23 0104  LIPASE 38   No results for input(s): AMMONIA in the last 168 hours. Coagulation Profile: No results for input(s): INR, PROTIME in the last 168 hours. Cardiac Enzymes: No results for input(s): CKTOTAL, CKMB, CKMBINDEX, TROPONINI in the last 168 hours. BNP (last 3 results) No results for input(s): PROBNP in the last 8760 hours. HbA1C: No results for input(s): HGBA1C in the last 72 hours. CBG: Recent Labs  Lab 09/09/23 2312  GLUCAP 111*   Lipid Profile: No results for input(s): CHOL, HDL, LDLCALC, TRIG, CHOLHDL, LDLDIRECT in the last 72 hours. Thyroid Function Tests: No results for input(s): TSH, T4TOTAL, FREET4, T3FREE, THYROIDAB in the last 72 hours. Anemia Panel: No results for input(s): VITAMINB12, FOLATE, FERRITIN, TIBC, IRON ,  RETICCTPCT in the last 72 hours. Sepsis Labs: No results for input(s): PROCALCITON, LATICACIDVEN in the last 168 hours.  No results found for this or any previous visit (from the past 240 hours).       Radiology Studies: DG Abd 1 View Result Date: 09/12/2023 CLINICAL DATA:  [id number] Abdominal pain [id number] EXAM: ABDOMEN - 1 VIEW COMPARISON:  None Available. FINDINGS: Nonobstructive bowel gas pattern. No pneumoperitoneum. No organomegaly or radiopaque calculi. No acute fracture or destructive lesion. The lung bases are clear. IMPRESSION: Nonobstructive bowel gas pattern. Electronically Signed   By: Rogelia Myers M.D.   On: 09/12/2023 19:35        Scheduled Meds:  apixaban   10 mg Oral BID   Followed by   Jacqueline [name] ON 09/17/2023] apixaban   5 mg Oral BID   clonazePAM   0.5 mg Oral QHS   gabapentin   100 mg Oral TID   zolpidem   5 mg Oral QHS   Continuous Infusions:   LOS: 2 days    Time spent: 30 min    Almarie KANDICE Hoots, MD  09/13/2023, 11:09 AM

## 2023-09-14 DIAGNOSIS — D6861 Antiphospholipid syndrome: Secondary | ICD-10-CM

## 2023-09-14 DIAGNOSIS — F32A Depression, unspecified: Secondary | ICD-10-CM

## 2023-09-14 DIAGNOSIS — I2699 Other pulmonary embolism without acute cor pulmonale: Secondary | ICD-10-CM | POA: Diagnosis not present

## 2023-09-14 DIAGNOSIS — T45516A Underdosing of anticoagulants, initial encounter: Secondary | ICD-10-CM

## 2023-09-14 DIAGNOSIS — I639 Cerebral infarction, unspecified: Secondary | ICD-10-CM

## 2023-09-14 DIAGNOSIS — R7989 Other specified abnormal findings of blood chemistry: Secondary | ICD-10-CM | POA: Diagnosis not present

## 2023-09-14 DIAGNOSIS — Z91148 Patient's other noncompliance with medication regimen for other reason: Secondary | ICD-10-CM

## 2023-09-14 DIAGNOSIS — R29707 NIHSS score 7: Secondary | ICD-10-CM | POA: Diagnosis not present

## 2023-09-14 LAB — LIPID PANEL
Cholesterol: 163 mg/dL (ref 0–200)
HDL: 49 mg/dL (ref >40)
LDL Cholesterol: 96 mg/dL (ref 0–99)
Total CHOL/HDL Ratio: 3.3 ratio
Triglycerides: 90 mg/dL (ref <150)
VLDL: 18 mg/dL (ref 0–40)

## 2023-09-14 LAB — CBC
HCT: 31.6 % — ABNORMAL LOW (ref 36.0–46.0)
Hemoglobin: 10.9 g/dL — ABNORMAL LOW (ref 12.0–15.0)
MCH: 29.2 pg (ref 26.0–34.0)
MCHC: 34.5 g/dL (ref 30.0–36.0)
MCV: 84.7 fL (ref 80.0–100.0)
Platelets: 94 K/uL — ABNORMAL LOW (ref 150–400)
RBC: 3.73 MIL/uL — ABNORMAL LOW (ref 3.87–5.11)
RDW: 13.2 % (ref 11.5–15.5)
WBC: 3.6 K/uL — ABNORMAL LOW (ref 4.0–10.5)
nRBC: 0 % (ref 0.0–0.2)

## 2023-09-14 LAB — COMPREHENSIVE METABOLIC PANEL WITH GFR
ALT: 10 U/L (ref 0–44)
AST: 13 U/L — ABNORMAL LOW (ref 15–41)
Albumin: 3.2 g/dL — ABNORMAL LOW (ref 3.5–5.0)
Alkaline Phosphatase: 67 U/L (ref 38–126)
Anion gap: 8 (ref 5–15)
BUN: 8 mg/dL (ref 6–20)
CO2: 21 mmol/L — ABNORMAL LOW (ref 22–32)
Calcium: 8.6 mg/dL — ABNORMAL LOW (ref 8.9–10.3)
Chloride: 111 mmol/L (ref 98–111)
Creatinine, Ser: 1 mg/dL (ref 0.44–1.00)
GFR, Estimated: >60 mL/min (ref >60)
Glucose, Bld: 89 mg/dL (ref 70–99)
Potassium: 3.5 mmol/L (ref 3.5–5.1)
Sodium: 140 mmol/L (ref 135–145)
Total Bilirubin: 0.7 mg/dL (ref 0.0–1.2)
Total Protein: 6.4 g/dL — ABNORMAL LOW (ref 6.5–8.1)

## 2023-09-14 MED ORDER — ATORVASTATIN CALCIUM 20 MG PO TABS
20.0000 mg | ORAL_TABLET | Freq: Every day | ORAL | Status: DC
  Administered 2023-09-14 – 2023-09-16 (×3): 20 mg via ORAL
  Filled 2023-09-14 (×3): qty 1

## 2023-09-14 MED ORDER — CLONAZEPAM 0.5 MG PO TABS
0.5000 mg | ORAL_TABLET | Freq: Three times a day (TID) | ORAL | Status: DC | PRN
Start: 2023-09-14 — End: 2023-09-16
  Administered 2023-09-14 – 2023-09-16 (×6): 0.5 mg via ORAL
  Filled 2023-09-14 (×6): qty 1
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
## 2023-09-14 NOTE — Consult Note (Signed)
 NEUROLOGY CONSULT NOTE   Date of service: September 14, 2023 Patient Name: Jacqueline [name] MRN:  [id number] DOB:  [birth year] Chief Complaint: acute stroke Requesting Provider: Will Almarie MATSU, MD  History of Present Illness  Jacqueline [name] is a 23 y.o. 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  She did have mildly elevated troponins in the ED and a CTA was obtained that demonstrated a chronic PE as well as a subsegmental PE.  She was found to be noncompliant with her medications as her medications were left at her sister's house and she went without her Eliquis  for approximately 5 days. She is currently reporting a headache and increased anxiety. MRI brain shows punctate acute infarct in R frontal region. She does not have any new focal neurologic deficits.  NIHSS components Score: Comment  1a Level of Conscious 0[x]  1[]  2[]  3[]      1b LOC Questions 0[x]  1[]  2[]       1c LOC Commands 0[x]  1[]  2[]       2 Best Gaze 0[x]  1[]  2[]       3 Visual 0[x]  1[]  2[]  3[]      4 Facial Palsy 0[]  1[x]  2[]  3[]      5a Motor Arm - left 0[x]  1[]  2[]  3[]  4[]  UN[]    5b Motor Arm - Right 0[]  1[]  2[x]  3[]  4[]  UN[]    6a Motor Leg - Left 0[x]  1[]  2[]  3[]  4[]  UN[]    6b Motor Leg - Right 0[]  1[]  2[x]  3[]  4[]  UN[]    7 Limb Ataxia 0[x]  1[]  2[]  UN[]      8 Sensory 0[]  1[x]  2[]  UN[]      9 Best Language 0[x]  1[]  2[]  3[]      10 Dysarthria 0[x]  1[]  2[]  UN[]      11 Extinct. and Inattention 0[]  1[x]  2[]       TOTAL:7       ROS  Comprehensive ROS performed and pertinent positives documented in HPI   Past History   Past Medical History:  Diagnosis Date   Bacterial vaginitis  07/10/2016   Candida vaginitis 07/24/2016   Headache in pregnancy, antepartum, third trimester 07/24/2016   Kell isoimmunization during pregnancy    Stroke [facility])     Past Surgical History:  Procedure Laterality Date   CESAREAN SECTION N/A 09/04/2016   Procedure: CESAREAN SECTION;  Surgeon: [name], DO;  Location: [facility];  Service: Obstetrics;  Laterality: N/A;   CESAREAN SECTION Bilateral    IR CT HEAD LTD  02/21/2023   IR GASTROSTOMY TUBE MOD SED  03/21/2023   IR GASTROSTOMY TUBE REMOVAL  05/14/2023   IR PERCUTANEOUS ART THROMBECTOMY/INFUSION INTRACRANIAL INC DIAG ANGIO  02/21/2023   IR REPLC GASTRO/COLONIC TUBE PERCUT W/FLUORO  03/27/2023   RADIOLOGY WITH ANESTHESIA N/A 02/21/2023   Procedure: IR WITH ANESTHESIA;  Surgeon: Dolphus Carrion, MD;  Location: [facility] OR;  Service: Radiology;  Laterality: N/A;    Family History: Family History  Problem Relation Age of Onset   Hypertension Maternal Grandmother    Diabetes Maternal Grandmother    Cancer Neg Hx     Social History  reports that she has quit smoking. Her smoking use included cigars. She has never used smokeless tobacco. She reports that she does not currently use alcohol.  She reports that she does not use drugs.  No Known Allergies  Medications   Current Facility-Administered Medications:    acetaminophen  (TYLENOL ) tablet 650 mg, 650 mg, Oral, Q6H PRN, 650 mg at 09/14/23 0429 **OR** acetaminophen  (TYLENOL ) suppository 650 mg, 650 mg, Rectal, Q6H PRN, Celinda Alm Lot, MD   apixaban  (ELIQUIS ) tablet 10 mg, 10 mg, Oral, BID, 10 mg at 09/14/23 0842 **FOLLOWED BY** [START ON 09/17/2023] apixaban  (ELIQUIS ) tablet 5 mg, 5 mg, Oral, BID, Griselda Norris, MD   clonazePAM  (KLONOPIN ) tablet 0.5 mg, 0.5 mg, Oral, TID PRN, Will Norris MATSU, MD, 0.5 mg at 09/14/23 9068   gabapentin  (NEURONTIN ) capsule 100 mg, 100 mg, Oral, TID, Will Norris MATSU, MD, 100 mg at 09/14/23 9157   HYDROmorphone  (DILAUDID )  injection 0.5 mg, 0.5 mg, Intravenous, Q6H PRN, Akula, Vijaya, MD, 0.5 mg at 09/14/23 1036   melatonin tablet 5 mg, 5 mg, Oral, QHS PRN, Chavez, Abigail, NP, 5 mg at 09/14/23 0142   ondansetron  (ZOFRAN ) tablet 4 mg, 4 mg, Oral, Q6H PRN **OR** ondansetron  (ZOFRAN ) injection 4 mg, 4 mg, Intravenous, Q6H PRN, Celinda Alm Lot, MD   zolpidem  (AMBIEN ) tablet 5 mg, 5 mg, Oral, QHS, Will Norris MATSU, MD, 5 mg at 09/13/23 2209  Vitals   Vitals:   09/13/23 0703 09/13/23 1432 09/13/23 2011 09/14/23 0340  BP: 102/73 (!) 94/58 100/64 (!) 131/100  Pulse: 71 72 75 (!) 104  Resp: 16 16 18 20   Temp: 98 F (36.7 C) 98.5 F (36.9 C) 98.7 F (37.1 C) 98.7 F (37.1 C)  TempSrc: Oral  Oral Oral  SpO2: 100% 100% 100% 96%  Weight:      Height:        Body mass index is 32.96 kg/m.   Physical Exam   Constitutional: Appears well-developed and well-nourished.  Psych: Tearful  Eyes: No scleral injection.  HENT: No OP obstruction.  Head: Normocephalic.  Cardiovascular: Normal rate and regular rhythm.  Respiratory: Effort normal, non-labored breathing.  GI: Soft.  No distension. There is no tenderness.  Skin: WDI.   Neurologic Examination   Neuro: Mental Status: Patient is awake, alert, oriented to person, place, month, year, and situation. Patient is able to give a clear and coherent history. Cranial Nerves: II: Visual Fields are full. Pupils are equal, round, and reactive to light.   III,IV, VI: EOMI without ptosis or diploplia.  V: Facial sensation is symmetric to temperature VII: Facial movement is symmetric resting and smiling VIII: Hearing is intact to voice X: Palate elevates symmetrically XI: Shoulder shrug is symmetric. XII: Tongue protrudes midline without atrophy or fasciculations.  Motor: Tone is increased in right upper and lower extremity. Bulk is normal.  RUE 2/5 proximally, 0/5 distal, brace in place RLE 3/5 proximally, 0/5 distal Sensory: Sensation is diminished  on the right upper and lower extremity, intermittent neglect to the right  Cerebellar: FNF intact on the left   Labs/Imaging/Neurodiagnostic studies   CBC:  Recent Labs  Lab 2023-09-17 1901 09/14/23 1124  WBC 4.5 3.6*  NEUTROABS 2.6  --   HGB 11.0* 10.9*  HCT 31.7* 31.6*  MCV 84.8 84.7  PLT 98* 94*   Basic Metabolic Panel:  Lab Results  Component Value Date   NA 140 09/14/2023   K 3.5 09/14/2023   CO2 21 (L) 09/14/2023   GLUCOSE 89 09/14/2023   BUN 8 09/14/2023   CREATININE 1.00 09/14/2023   CALCIUM  8.6 (L) 09/14/2023   GFRNONAA >60 09/14/2023   GFRAA >60 12/06/2016  Lipid Panel:  Lab Results  Component Value Date   LDLCALC 42 02/22/2023   HgbA1c:  Lab Results  Component Value Date   HGBA1C 4.4 (L) 02/21/2023   Urine Drug Screen:     Component Value Date/Time   LABOPIA NONE DETECTED 02/21/2023 1153   COCAINSCRNUR NONE DETECTED 02/21/2023 1153   LABBENZ NONE DETECTED 02/21/2023 1153   AMPHETMU NONE DETECTED 02/21/2023 1153   THCU NONE DETECTED 02/21/2023 1153   LABBARB NONE DETECTED 02/21/2023 1153    Alcohol Level     Component Value Date/Time   ETH 14 (H) 02/21/2023 1042   INR  Lab Results  Component Value Date   INR 1.1 03/21/2023   APTT  Lab Results  Component Value Date   APTT >200 (HH) 03/20/2023   MRI Brain(Personally reviewed): MRI brain does show a new punctate infarct in the right frontal [place] matter  Echocardiogram:  1. LV function low normal to mildly reduced; EF 50.  2. Left ventricular ejection fraction, by estimation, is 50 to 55%. The left ventricle has low normal function. The left ventricle has no regional wall motion abnormalities. Left ventricular diastolic parameters were  normal.  3. Right ventricular systolic function is normal. The right ventricular size is normal. Tricuspid regurgitation signal is inadequate for assessing  PA pressure.  4. The mitral valve is normal in structure. Mild mitral valve regurgitation. No  evidence of mitral stenosis.  5. The aortic valve is tricuspid. Aortic valve regurgitation is not  visualized. No aortic stenosis is present.  6. The inferior vena cava is normal in size with greater than 50%  respiratory variability, suggesting right atrial pressure of 3 mmHg.   Stroke Labs     Component Value Date/Time   CHOL 163 09/14/2023 1554   TRIG 90 09/14/2023 1554   HDL 49 09/14/2023 1554   CHOLHDL 3.3 09/14/2023 1554   VLDL 18 09/14/2023 1554   LDLCALC 96 09/14/2023 1554    Lab Results  Component Value Date/Time   HGBA1C 4.4 (L) 02/21/2023 05:18 PM     ASSESSMENT   Jacqueline [name] is a 23 y.o. female with a history of antiphospholipid syndrome and previous left MCA territory infarct who was noncompliant with Eliquis  for approximately 5 days returning with a pulmonary embolism and punctate acute infarct in the right frontal [place] matter. Per hospitalist she had a fight with her sister and the eliquis  was at sister's house which is why she had not taken it. Eliquis  resumed inpatient. Etiology of stroke is embolic/thrombotic in the setting of hypercoagulability 2/2 antiphospholipid syndrome noncompliant with eliquis . Complete stroke work up with CTA Head and Neck, LDL, and hemoglobin A1C. Patient reports significant depression and recent hx SI although she denies any current thoughts of hurting herself. Recommend psychiatry consult.  RECOMMENDATIONS  -Vessel imaging to complete stroke workup now ordered - Atorvastatin  20mg  daily -May uptitrate gabapentin  to 300mg  tid if headache persists - Psychiatry consult per above - Patient has unstable housing situation, recommend further discussion with case mgmt about what resources might be available to her -Eliquis  resumed -Follow-up with [facility] outpatient  -Established with Dr. Margaret and was last seen 6/3  We will f/u on CTA results and communicate any further recommendations to primary team at that  time. Otherwise neurology will be available prn for questions going forward. ______________________________________________________________________    Signed, Jorene Last, NP [facility]   Attending Neurohospitalist Addendum Patient seen and examined with APP/Resident. Agree with the history and  physical as documented above. Agree with the plan as documented, which I helped formulate. I have edited the note above to reflect my full findings and recommendations. I have independently reviewed the chart, obtained history, review of systems and examined the patient.I have personally reviewed pertinent head/neck/spine imaging (CT/MRI). Please feel free to call with any questions.  -- Elida Ross, MD [facility] [phone]  If 7pm- 7am, please page neurology on call as listed in [facility].

## 2023-09-14 NOTE — Evaluation (Signed)
 Physical Therapy Evaluation Patient Details Name: Jacqueline [name] MRN: [id number] DOB: Jul 04, 2000 Today's Date: 09/14/2023  History of Present Illness  Jacqueline [name] is a 23 y.o. female  CT head without contrast with no acute intracranial abnormality.  CTA chest showing a right lower lobe posterior basilar branch point subsegmental embolus. MRI of the brain concerning for acute stroke right fronto parietal. 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 Pt reports ind with hemi walker and w/c at home at baseline, doesn't elaborate on which is primary use of locomotion. Pt reports ind with self care, also reports needs help but states I help myself. No family present, pt states sister who she most recently lived with kicked her out and she has no where to go. On eval, pt needing supv with all mobility, strong use of bedrail to assist self with bed mobility, hemi walker with transfers and amb of 120 ft. No ankle AFO present but pt reports she uses one at home. Pt may benefit from post acute PT f/u HHPT vs OPPT. Pt currently with functional limitations due to the deficits listed below (see PT Problem List). Pt will benefit from acute skilled PT to increase their independence and safety with mobility to allow discharge.           If plan is discharge home, recommend the following: Assistance with cooking/housework;Assist for transportation;Help with stairs or ramp for entrance   Can travel by private vehicle        Equipment Recommendations None recommended by PT  Recommendations for Other Services       Functional Status Assessment Patient has had a recent decline in their functional status and  demonstrates the ability to make significant improvements in function in a reasonable and predictable amount of time.     Precautions / Restrictions Precautions Precautions: Fall Precaution/Restrictions Comments: PE, R hemiparesis Restrictions Weight Bearing Restrictions Per Provider Order: No      Mobility  Bed Mobility Overal bed mobility: Modified Independent             General bed mobility comments: uses bedrail, no assist, increased effort and time    Transfers Overall transfer level: Needs assistance Equipment used: Hemi-walker Transfers: Sit to/from Stand Sit to Stand: Supervision           General transfer comment: supv to power to stand with hemi walker, using rocking momentum, no physical assist to rise or steady    Ambulation/Gait Ambulation/Gait assistance: Supervision Gait Distance (Feet): 120 Feet Assistive device: Hemi-walker Gait Pattern/deviations: Step-to pattern, Decreased stride length Gait velocity: decreased     General Gait Details: step to pattern, slight circumduction with hip flexion for RLE swing phase, no active dorsiflexion noted, no AFO in room and pt reports has one at home  Stairs            Wheelchair Mobility     Tilt Bed    Modified Rankin (Stroke Patients Only)       Balance Overall balance assessment: Needs assistance Sitting-balance support: Feet supported Sitting balance-Leahy Scale: Good     Standing balance support: During functional activity, Single extremity supported Standing balance-Leahy Scale: Fair  Pertinent Vitals/Pain Pain Assessment Pain Assessment: Faces Faces Pain Scale: Hurts even more Pain Location: head Pain Descriptors / Indicators: Headache Pain Intervention(s): Limited activity within patient's tolerance, Monitored during session, Premedicated before session    Home Living Family/patient expects to be discharged to:: Private  residence Living Arrangements: Other relatives (Sister) Available Help at Discharge: Family;Available 24 hours/day Type of Home: House Home Access: Stairs to enter   [employer] of Steps: 2   Home Layout: Two level;Able to live on main level with bedroom/bathroom Home Equipment: BSC/3in1;Wheelchair - manual;Other (comment) (hemi walker) Additional Comments: pt reports no longer has an aide, reports sister (home described above) kicked her out and she has no where to go    Prior Function               Mobility Comments: pt reports using hemi walker and w/c, unclear which is primary mode of locomotion ADLs Comments: pt reports needs help but then reports I help myself     Extremity/Trunk Assessment   Upper Extremity Assessment Upper Extremity Assessment: Defer to OT evaluation    Lower Extremity Assessment Lower Extremity Assessment: RLE deficits/detail;LLE deficits/detail RLE Deficits / Details: ankle dorsiflexion 0/5, hip flexion 3+/5, knee extension 3+/5 LLE Deficits / Details: AROM WFL, strength 4/5 throughout LLE Sensation: WNL LLE Coordination: WNL    Cervical / Trunk Assessment Cervical / Trunk Assessment: Normal  Communication   Communication Communication: Impaired Factors Affecting Communication: Difficulty expressing self (aphasia)    Cognition Arousal: Lethargic Behavior During Therapy: Flat affect   PT - Cognitive impairments: No family/caregiver present to determine baseline                       PT - Cognition Comments: pt states name, age, current month/year appropriately, reports I don't know in regards to birthday, very flat and slow to respond to cues Following commands: Impaired Following commands impaired: Follows one step commands with increased time     Cueing Cueing Techniques: Verbal cues     General Comments      Exercises     Assessment/Plan    PT Assessment Patient needs continued PT services  PT  Problem List Decreased strength;Decreased activity tolerance;Decreased balance;Decreased mobility;Pain       PT Treatment Interventions DME instruction;Gait training;Stair training;Functional mobility training;Therapeutic activities;Therapeutic exercise;Balance training;Neuromuscular re-education;Cognitive remediation;Patient/family education;Wheelchair mobility training    PT Goals (Current goals can be found in the Care Plan section)  Acute Rehab PT Goals Patient Stated Goal: [facility] PT Goal Formulation: With patient Time For Goal Achievement: 09/28/23 Potential to Achieve Goals: Good    Frequency Min 3X/week     Co-evaluation               AM-PAC PT 6 Clicks Mobility  Outcome Measure Help needed turning from your back to your side while in a flat bed without using bedrails?: A Little Help needed moving from lying on your back to sitting on the side of a flat bed without using bedrails?: A Little Help needed moving to and from a bed to a chair (including a wheelchair)?: A Little Help needed standing up from a chair using your arms (e.g., wheelchair or bedside chair)?: A Little Help needed to walk in hospital room?: A Little Help needed climbing 3-5 steps with a railing? : A Lot 6 Click Score: 17    End of Session Equipment Utilized During Treatment: Gait belt Activity Tolerance: Patient tolerated treatment well Patient left: in bed;with  call bell/phone within reach;with bed alarm set Nurse Communication: Mobility status PT Visit Diagnosis: Muscle weakness (generalized) (M62.81);Other symptoms and signs involving the nervous system (R29.898);Hemiplegia and hemiparesis Hemiplegia - Right/Left: Right    Time: 8652-8571 (out of room 14:00-14:15) PT Time Calculation (min) (ACUTE ONLY): 41 min (26 minutes)   Charges:   PT Evaluation $PT Eval Moderate Complexity: 1 Mod PT Treatments $Gait Training: 8-22 mins PT General Charges $$ ACUTE PT VISIT: 1 Visit          Tori Idus Rathke PT, DPT 09/14/23, 2:44 PM

## 2023-09-14 NOTE — Progress Notes (Signed)
 Patient refused non-slip socks. Bed alarm activated.

## 2023-09-15 ENCOUNTER — Inpatient Hospital Stay (HOSPITAL_COMMUNITY)

## 2023-09-15 DIAGNOSIS — I63512 Cerebral infarction due to unspecified occlusion or stenosis of left middle cerebral artery: Secondary | ICD-10-CM | POA: Diagnosis not present

## 2023-09-15 LAB — HEMOGLOBIN A1C
Hgb A1c MFr Bld: 4.7 % — ABNORMAL LOW (ref 4.8–5.6)
Mean Plasma Glucose: 88 mg/dL

## 2023-09-15 LAB — TSH: TSH: 2.335 u[IU]/mL (ref 0.350–4.500)

## 2023-09-15 MED ORDER — IOHEXOL 350 MG/ML SOLN
75.0000 mL | Freq: Once | INTRAVENOUS | Status: AC | PRN
  Administered 2023-09-15: 75 mL via INTRAVENOUS

## 2023-09-15 MED ORDER — GABAPENTIN 100 MG PO CAPS
200.0000 mg | ORAL_CAPSULE | Freq: Three times a day (TID) | ORAL | Status: DC
  Administered 2023-09-15 – 2023-09-16 (×2): 200 mg via ORAL
  Filled 2023-09-15 (×2): qty 2

## 2023-09-15 MED ORDER — GABAPENTIN 100 MG PO CAPS: 200.0000 mg | ORAL_CAPSULE | Freq: Three times a day (TID) | ORAL | Status: DC

## 2023-09-15 MED ORDER — SODIUM CHLORIDE (PF) 0.9 % IJ SOLN
INTRAMUSCULAR | Status: AC
Start: 2023-09-15 — End: 2023-09-15
  Filled 2023-09-15: qty 50

## 2023-09-15 NOTE — Plan of Care (Signed)
  Problem: Health Behavior/Discharge Planning: Goal: Ability to manage health-related needs will improve Outcome: Progressing   Problem: Clinical Measurements: Goal: Ability to maintain clinical measurements within normal limits will improve Outcome: Progressing Goal: Will remain free from infection Outcome: Progressing Goal: Diagnostic test results will improve Outcome: Progressing Goal: Respiratory complications will improve Outcome: Progressing Goal: Cardiovascular complication will be avoided Outcome: Progressing   Problem: Activity: Goal: Risk for activity intolerance will decrease Outcome: Progressing   Problem: Nutrition: Goal: Adequate nutrition will be maintained Outcome: Progressing   Problem: Coping: Goal: Level of anxiety will decrease Outcome: Progressing   Problem: Pain Managment: Goal: General experience of comfort will improve and/or be controlled Outcome: Progressing   Problem: Safety: Goal: Ability to remain free from injury will improve Outcome: Progressing   Problem: Skin Integrity: Goal: Risk for impaired skin integrity will decrease Outcome: Progressing
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
## 2023-09-15 NOTE — TOC Progression Note (Addendum)
 Transition of Care [facility]) - Progression Note    Patient Details  Name: Jacqueline [name] MRN: [id number] Date of Birth: [birth year]  Transition of [facility]) CM/SW Contact  Heather DELENA Saltness, LCSW Phone Number: 09/15/2023, 12:34 PM  Clinical Narrative:     ADDENDUM  4:25 PM - CSW spoke with pt's sister, Giovannina Mun [phone], via phone call to discuss discharge planning. CSW advised pt's sister that pt will be discharged tomorrow because she is medically stable. [name] inquired about inpatient rehab at [facility]. CSW explained that pt has been recommended for OPPT/HHPT by hospital PT. CSW explained that pt cannot be admitted to [facility] without a recommendation by PT. CSW explained that even though pt does not have housing, it's not a reason to keep pt admitted to the hospital. CSW inquired about pt staying with family/friends temporarily until housing is found. [name] reports there is no one that she can stay with right now. CSW explained housing resources, financial resources, and OPPT appointment added to AVS. CSW reiterated that pt is discharging tomorrow due to being medically stable. [name] reports understanding.  CSW met with pt at bedside and spoke with pt's sister, Felicia Bloomquist [phone], via phone call in pt room. Pt reports currently being homeless and seeking inpatient PT/OT rehabilitation. CSW educated pt and sister about PT evaluation recommending OPPT or HHPT services. Pt reports she currently does not have housing, so HHPT is not an option. Pt also inquired about financial assistance and SSDI. CSW reports pt will need to contact social services/social security office to begin process for SSDI. CSW will add housing and financial assistance resources to AVS. Referral placed for OPPT at [facility].      Expected Discharge Plan and Services  Home with OPPT                       Social Determinants of Health (SDOH)  Interventions SDOH Screenings   Food Insecurity: No Food Insecurity (09/10/2023)  Housing: Low Risk  (09/10/2023)  Transportation Needs: Unmet Transportation Needs (09/10/2023)  Utilities: Not At Risk (09/10/2023)  Financial Resource Strain: Not on File (06/21/2021)   Received from [facility]  Physical Activity: Not on File (06/21/2021)   Received from [facility]  Social Connections: Not on File (11/16/2022)   Received from [facility]  Stress: Not on File (06/21/2021)   Received from [facility]  Tobacco Use: Medium Risk (09/09/2023)    Readmission Risk Interventions    09/15/2023   12:34 PM 05/09/2023    1:23 PM  Readmission Risk Prevention Plan  Transportation Screening Complete Complete  PCP or Specialist Appt within 3-5 Days Complete Complete  HRI or Home Care Consult Complete Complete  Social Work Consult for Recovery Care Planning/Counseling Complete Complete  Palliative Care Screening Not Applicable Not Applicable  Medication Review (RN Care Manager) Complete Complete    Heather Saltness, MSW, LCSW 09/15/2023 12:42 PM

## 2023-09-15 NOTE — Plan of Care (Signed)
 Neurology plan of care  Please see neurology consult note from yesterday. CTA H&N performed and personally reviewed; no hemodynamically-significant stenoses.   CTA H&N 1. Negative CT angiogram of the neck. 2. Mild stenosis of the cavernous segments of internal carotid arteries and the M1 segments, less than 20%. No flow-limiting stenosis. 3. Large area of encephalomalacia within the left cerebral hemisphere without significant change from prior study.  No change to recommendations in yesterday's consult note. Neurology will sign off, but please re-engage if additional neurologic concerns arise. She may follow up with her established outpatient neurologist Dr. Margaret after discharge.  Jacqueline Ross, MD [facility] [phone]  If 7pm- 7am, please page neurology on call as listed in AMION.,

## 2023-09-15 NOTE — Progress Notes (Signed)
 Per referral (patient request) through [address], PT, I attempted to visit with Jacqueline [name].  At time of visit I could hear several visitors present and they seemed very engaged in discussion. I elected not to interrupt this visit but to plan for follow up support.  Spiritual care support remains available and a chaplain will follow up.  Dayanne Yiu L. Fredrica, M.Div [phone]

## 2023-09-16 ENCOUNTER — Other Ambulatory Visit (HOSPITAL_COMMUNITY): Payer: Self-pay

## 2023-09-16 DIAGNOSIS — R7989 Other specified abnormal findings of blood chemistry: Secondary | ICD-10-CM | POA: Diagnosis not present

## 2023-09-16 MED ORDER — APIXABAN 5 MG PO TABS
ORAL_TABLET | ORAL | 0 refills | Status: DC
  Filled 2023-09-16: qty 180, 90d supply, fill #0

## 2023-09-16 MED ORDER — ESCITALOPRAM OXALATE 10 MG PO TABS
ORAL_TABLET | ORAL | 2 refills | Status: AC
  Filled 2023-09-16: qty 30, 33d supply, fill #0

## 2023-09-16 MED ORDER — CLONAZEPAM 0.5 MG PO TABS
0.5000 mg | ORAL_TABLET | Freq: Every day | ORAL | 0 refills | Status: DC
  Filled 2023-09-16: qty 30, 30d supply, fill #0

## 2023-09-16 MED ORDER — GABAPENTIN 100 MG PO CAPS
200.0000 mg | ORAL_CAPSULE | Freq: Three times a day (TID) | ORAL | 2 refills | Status: DC
Start: 2023-09-16 — End: 2023-10-26
  Filled 2023-09-16: qty 180, 30d supply, fill #0

## 2023-09-16 MED ORDER — ATORVASTATIN CALCIUM 20 MG PO TABS
20.0000 mg | ORAL_TABLET | Freq: Every day | ORAL | 4 refills | Status: DC
  Filled 2023-09-16: qty 90, 90d supply, fill #0

## 2023-09-16 MED ORDER — ORAL CARE MOUTH RINSE: 15.0000 mL | OROMUCOSAL | Status: DC | PRN

## 2023-09-16 NOTE — Discharge Summary (Signed)
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 demonstrate no significant findings. Lungs/Pleura: Diffuse bronchial thickening. There is mild posterior atelectasis. Consolidation change previously in the right lower lobe basal segments has resolved. 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 There is no consolidation, effusion, pneumothorax or nodules. Musculoskeletal: No chest wall abnormality. No acute or significant osseous findings. Review of the MIP images confirms the above findings. CT ABDOMEN and PELVIS FINDINGS Hepatobiliary: No focal liver abnormality is seen. No gallstones, gallbladder wall thickening, or biliary dilatation. Pancreas: No abnormality. Spleen: No abnormality. 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 No adrenal or renal mass enhancement. No urinary stone or obstruction. Unremarkable bladder for the degree of distension. Stomach/Bowel: Since 03/26/2023, interval PEG tube Removal. No secondary  complication is seen. No GI tract dilatation or wall thickening. Normal appendix. Moderate retained stool ascending colon. No evidence of colitis or diverticulitis. Vascular/Lymphatic: No significant vascular findings are present. No enlarged abdominal or pelvic lymph nodes. Reproductive: Uterus and bilateral adnexa are unremarkable. Other: Mild rectus diastasis at the umbilicus and small umbilical fat hernia. No incarcerated hernia. No free fluid, free hemorrhage or free air. Musculoskeletal: No acute or significant osseous findings. Review of the MIP images confirms the above findings. IMPRESSION: 1. Right lower lobe posterior basilar branch point subsegmental embolus. Downstream from this multiple small posterior basal distal branches are unopacified compared to the left and probably also harbor  thrombus, but this was seen on the studies from February 2024 and probably chronic. The overall clot burden appears small. 2. No other evidence of acute PE. No findings of acute right heart strain. 3. Mild cardiomegaly. 4. 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 5. No acute findings in the abdomen or pelvis. 6. Constipation. 7. Small umbilical fat hernia. 8. Interval PEG tube Removal since 03/26/2023. No secondary complication is seen. Electronically Signed   By: Francis Quam M.D.   On: 09/10/2023 06:26   CT ABDOMEN PELVIS W CONTRAST Result Date: 09/10/2023 CLINICAL DATA:  On 02/22/2023 the patient had a large left hemispheric MCA stroke, with weakness since then. Patient is only intermittently mobile with high risk for pulmonary embolism and history of right lower lobe PE in February 2024. She also presents with left lower quadrant abdominal pain. EXAM: CT ANGIOGRAPHY CHEST CT ABDOMEN AND PELVIS WITH CONTRAST TECHNIQUE: Multidetector CT imaging of the chest was performed using the standard protocol during bolus administration of intravenous  contrast. Multiplanar CT image reconstructions and MIPs were obtained to evaluate the vascular anatomy. Multidetector CT imaging of the abdomen and pelvis was performed using the standard protocol during bolus administration of intravenous contrast. RADIATION DOSE REDUCTION: This exam was performed according to the departmental dose-optimization program which includes automated exposure control, adjustment of the mA and/or kV according to patient size and/or use of iterative reconstruction technique. CONTRAST:  OMNIPAQUE  IOHEXOL  350 MG/ML SOLN COMPARISON:  CTA chest studies 04/08/2022, 04/13/2022 and 11/03/2021, CT abdomen contrast 03/26/2023. No prior pelvic CT. FINDINGS: CTA CHEST FINDINGS Cardiovascular: The pulmonary trunk is upper limit normal in caliber, unchanged. Mild cardiomegaly. The pulmonary veins are nondistended. There is a right lower lobe posterior basilar branch point subsegmental embolus on series 4 axial 65-67. Downstream from this multiple small posterior basal  distal branches are unopacified compared to the left and probably also harbor  thrombus, but this was seen on the studies from February 2024 and probably chronic. Remainder of the pulmonary arteries appear free of thrombus. There were several lateral basal segmental and subsegmental emboli on the prior studies which are no longer seen. There is a small chronic anterior pericardial effusion. The aorta and great vessels are normal. Mediastinum/Nodes: No enlarged mediastinal, hilar, or axillary lymph nodes. Thyroid gland, trachea, and esophagus demonstrate no significant findings. Lungs/Pleura: Diffuse bronchial thickening. There is mild posterior atelectasis. Consolidation change previously in the right lower lobe basal segments has resolved. 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 There is no consolidation, effusion, pneumothorax or nodules. Musculoskeletal: No chest wall  abnormality. No acute or significant osseous findings. Review of the MIP images confirms the above findings. CT ABDOMEN and PELVIS FINDINGS Hepatobiliary: No focal liver abnormality is seen. No gallstones, gallbladder wall thickening, or biliary dilatation. Pancreas: No abnormality. Spleen: No abnormality. 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 No adrenal or renal mass enhancement. No urinary stone or obstruction. Unremarkable bladder for the degree of distension. Stomach/Bowel: Since 03/26/2023, interval PEG tube Removal. No secondary complication is seen. No GI tract dilatation or wall thickening. Normal appendix. Moderate retained stool ascending colon. No evidence of colitis or diverticulitis. Vascular/Lymphatic: No significant vascular findings are present. No enlarged abdominal or pelvic lymph nodes. Reproductive: Uterus and bilateral adnexa are unremarkable. Other: Mild rectus diastasis at the umbilicus and small umbilical fat hernia. No incarcerated hernia. No free fluid, free hemorrhage or free air. Musculoskeletal: No acute or significant osseous findings. Review of the MIP images confirms the above findings. IMPRESSION: 1. Right lower lobe posterior basilar branch point subsegmental embolus. Downstream from this multiple small posterior basal distal branches are unopacified compared to the left and probably also harbor  thrombus, but this was seen on the studies from February 2024 and probably chronic. The overall clot burden appears small. 2. No other evidence of acute PE. No findings of acute right heart strain. 3. Mild cardiomegaly. 4. 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 5. No acute findings in the abdomen or pelvis. 6. Constipation. 7. Small umbilical fat hernia. 8. Interval PEG tube Removal since 03/26/2023. No secondary complication is seen. Electronically Signed   By: Francis Quam M.D.   On: 09/10/2023 06:26   CT Head Wo Contrast Result Date: 09/10/2023 CLINICAL DATA:  Headache EXAM: CT HEAD WITHOUT CONTRAST TECHNIQUE: Contiguous axial images were obtained from the base of the skull through the vertex without intravenous contrast. RADIATION DOSE REDUCTION: This exam was performed according to the departmental dose-optimization program which includes automated exposure control, adjustment of the mA and/or kV according to patient size and/or use of iterative reconstruction technique. 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 No acute intracranial abnormality. Specifically, no hemorrhage, hydrocephalus, mass lesion, acute infarction, or significant intracranial injury. Vascular: No hyperdense vessel or unexpected calcification. Skull: No acute calvarial abnormality. Sinuses/Orbits: No acute findings Other: None IMPRESSION: Stable left frontoparietal and temporal encephalomalacia. No acute intracranial abnormality. Electronically Signed   By: Franky Crease M.D.   On: 09/10/2023 01:17   DG Chest Port 1 View Result Date: 09/10/2023 CLINICAL DATA:  Chest pain.  Altered mental status.  Confusion. EXAM: PORTABLE CHEST 1 VIEW  COMPARISON:  03/26/2023 FINDINGS: The heart size and mediastinal contours are within normal limits. Both lungs are clear. The visualized skeletal structures are unremarkable. IMPRESSION: No active disease. Electronically Signed   By: Elsie Gravely M.D.   On: 09/10/2023 01:02   (Echo, Carotid, EGD, Colonoscopy, ERCP)    Subjective:  Awake alert no new c/o Discharge Exam: Vitals:   09/15/23 2012 09/16/23 0513  BP: 110/68 98/62  Pulse: 82 85  Resp: 16 16  Temp: 97.9 F (36.6 C) 97.8 F (36.6 C)  SpO2: 100% 98%   Vitals:   09/15/23 0458 09/15/23 1253 09/15/23 2012 09/16/23 0513  BP: 101/64 104/68 110/68 98/62  Pulse: 63 71 82 85  Resp: 18 18 16  16   Temp: 98.4 F (36.9 C) 98.3 F (36.8 C) 97.9 F (36.6 C) 97.8 F (36.6 C)  TempSrc: Oral Oral Oral Oral  SpO2: 95% 100% 100% 98%  Weight:      Height:        General: Pt is alert, awake, not in acute distress Cardiovascular reg Respiratory: CTA bilaterally, no wheezing, no rhonchi Abdominal: Soft, NT, ND, bowel sounds + Extremities: no edema, no cyanosis    The results of significant diagnostics from this hospitalization (including imaging, microbiology, ancillary and laboratory) are listed below for reference.     Microbiology: No results found for this or any previous visit (from the past 240 hours).   Labs: BNP (last 3 results) No results for input(s): BNP in the last 8760 hours. Basic Metabolic Panel: Recent Labs  Lab 09/09/23 2022 09/11/23 0600 09/12/23 1901 09/14/23 1124  NA 136 131* 140 140  K 3.7 3.5 3.7 3.5  CL 107 103 110 111  CO2 19* 19* 24 21*  GLUCOSE 93 88 83 89  BUN 12 18 16 8   CREATININE 0.82 1.26* 1.07* 1.00  CALCIUM  8.8* 7.8* 8.6* 8.6*   Liver Function Tests: Recent Labs  Lab 09/09/23 2022 09/11/23 0600 09/12/23 1901 09/14/23 1124  AST 24 27 17  13*  ALT 13 13 12 10   ALKPHOS 67 54 65 67  BILITOT 0.9 1.0 0.6 0.7  PROT 6.8 6.3* 6.4* 6.4*  ALBUMIN 3.7 3.2* 3.3* 3.2*   Recent Labs  Lab 09/10/23 0104  LIPASE 38   No results for input(s): AMMONIA in the last 168 hours. CBC: Recent Labs  Lab 09/09/23 2022 09/11/23 0600 09/12/23 1901 09/14/23 1124  WBC 4.5 4.1 4.5 3.6*  NEUTROABS  --   --  2.6  --   HGB 12.5 11.1* 11.0* 10.9*  HCT 35.9* 31.1* 31.7* 31.6*  MCV 84.5 82.9 84.8 84.7  PLT 108* 93* 98* 94*   Cardiac Enzymes: No results for input(s): CKTOTAL, CKMB, CKMBINDEX, TROPONINI in the last 168 hours. BNP: Invalid input(s): POCBNP CBG: Recent Labs  Lab 09/09/23 2312  GLUCAP 111*   D-Dimer No results for input(s): DDIMER in the last 72 hours. Hgb A1c Recent Labs    09/14/23 1554  HGBA1C 4.7*    Lipid Profile Recent Labs    09/14/23 1554  CHOL 163  HDL 49  LDLCALC 96  TRIG 90  CHOLHDL 3.3   Thyroid function studies Recent Labs    09/15/23 1732  TSH 2.335   Anemia work up No results for input(s): VITAMINB12, FOLATE, FERRITIN, TIBC, IRON , RETICCTPCT in the last 72 hours. Urinalysis    Component Value Date/Time   COLORURINE RED (A) 09/09/2023 2038   APPEARANCEUR CLOUDY (A) 09/09/2023 2038   LABSPEC 1.011 09/09/2023 2038  PHURINE 6.0 09/09/2023 2038   GLUCOSEU NEGATIVE 09/09/2023 2038   HGBUR MODERATE (A) 09/09/2023 2038   BILIRUBINUR NEGATIVE 09/09/2023 2038   KETONESUR NEGATIVE 09/09/2023 2038   PROTEINUR 100 (A) 09/09/2023 2038   UROBILINOGEN 1.0 05/08/2021 1944   NITRITE NEGATIVE 09/09/2023 2038   LEUKOCYTESUR NEGATIVE 09/09/2023 2038   Sepsis Labs Recent Labs  Lab 09/09/23 2022 09/11/23 0600 09/12/23 1901 09/14/23 1124  WBC 4.5 4.1 4.5 3.6*   Microbiology No results found for this or any previous visit (from the past 240 hours).   Time coordinating discharge: 39 min SIGNED:   Almarie KANDICE Hoots, MD  [facility] 09/16/2023, 3:34 PM

## 2023-09-16 NOTE — TOC Transition Note (Signed)
 Transition of Care [facility]) - Discharge Note   Patient Details  Name: Jacqueline [name] MRN: [id number] Date of Birth: February 03, 2001  [facility] CM/SW Contact:  Toy LITTIE Agar, RN Phone Number:[phone]  09/16/2023, 10:25 AM   Clinical Narrative:  CM has reviewed chart for discharge needs. CM has reviewed previous TOC note which covers the details of patients situation and needs. There are no additional needs to be addressed per TOC. Bus passes are at nurses station if patient should decide that she wants to use the pass. TOC will sign off.    Final next level of care: Home/Self Care Barriers to Discharge: No Barriers Identified   Patient Goals and CMS Choice     Choice offered to / list presented to : NA      Discharge Placement                       Discharge Plan and Services Additional resources added to the After Visit Summary for                  DME Arranged: N/A DME Agency: NA       HH Arranged: NA HH Agency: NA        Social Drivers of Health (SDOH) Interventions SDOH Screenings   Food Insecurity: No Food Insecurity (09/10/2023)  Housing: Low Risk  (09/10/2023)  Transportation Needs: Unmet Transportation Needs (09/10/2023)  Utilities: Not At Risk (09/10/2023)  Financial Resource Strain: Not on File (06/21/2021)   Received from [facility]  Physical Activity: Not on File (06/21/2021)   Received from [facility]  Social Connections: Not on File (11/16/2022)   Received from [facility]  Stress: Not on File (06/21/2021)   Received from [facility]  Tobacco Use: Medium Risk (09/09/2023)     Readmission Risk Interventions    09/15/2023   12:34 PM 05/09/2023    1:23 PM  Readmission Risk Prevention Plan  Transportation Screening Complete Complete  PCP or Specialist Appt within 3-5 Days Complete Complete  HRI or Home Care Consult Complete Complete  Social Work Consult for Recovery Care Planning/Counseling Complete Complete  Palliative Care Screening Not Applicable Not  Applicable  Medication Review Oceanographer) Complete Complete

## 2023-09-16 NOTE — Consult Note (Signed)
  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 She presented to the emergency department with complaints of generalized weakness and headache. During her stay, neurology noted that she endorsed recent suicidal ideation, though she denied having a specific plan or intent. She attributed her depressive symptoms and suicidal thoughts to multiple stressors, including her physical limitations resulting from her prior stroke and her homelessness, which has been a significant barrier to her care.  Although she had expressed a need for a psychiatric consultation due to these symptoms, the patient left the hospital prior to being seen by me. Neurology's note mentioned that she denied current suicidal ideation at the time of their assessment. Her primary physician had started her on Lexapro  (Escitalopram ) prior to discharge to address her depressive symptoms. Given her complex medical and social situation, including her physical limitations from the stroke and ongoing homelessness, it is recommended that she follow up with her primary care provider for continuity of care.  In terms of her psychiatric care, it is essential that the patient has appropriate follow-up for both medication management and mental health support. A referral to social services for assistance with her homelessness and further psychosocial support would also be helpful. Given her history of suicidal ideation, it is important that she be closely monitored, especially in light of her physical and social stressors. If she presents again, I would recommend further evaluation for her depressive symptoms, continued medication management, and  assistance with resources.

## 2023-09-16 NOTE — Plan of Care (Signed)
 Patient alert, intermittently c/o headache, given PRN pain medication, patient refuses to ambulate in the hall, able to transfer self to the [facility] with family supervision, Bed locked and in low position, Side rails upright. Call light given and within reach. Comfort measures provided.  Problem: Pain Managment: Goal: General experience of comfort will improve and/or be controlled Outcome: Progressing   Problem: Coping: Goal: Level of anxiety will decrease Outcome: Progressing

## 2023-09-17 ENCOUNTER — Emergency Department (HOSPITAL_COMMUNITY)

## 2023-09-17 ENCOUNTER — Emergency Department (HOSPITAL_COMMUNITY)
Admission: EM | Admit: 2023-09-17 | Discharge: 2023-09-18 | Disposition: A | Discharge status: Home / Self Care | Attending: Emergency Medicine | Admitting: Emergency Medicine

## 2023-09-17 ENCOUNTER — Encounter (HOSPITAL_COMMUNITY): Payer: Self-pay

## 2023-09-17 ENCOUNTER — Other Ambulatory Visit: Payer: Self-pay

## 2023-09-17 DIAGNOSIS — R1114 Bilious vomiting: Secondary | ICD-10-CM | POA: Insufficient documentation

## 2023-09-17 DIAGNOSIS — R519 Headache, unspecified: Secondary | ICD-10-CM | POA: Insufficient documentation

## 2023-09-17 DIAGNOSIS — Z7901 Long term (current) use of anticoagulants: Secondary | ICD-10-CM | POA: Diagnosis not present

## 2023-09-17 DIAGNOSIS — Z8673 Personal history of transient ischemic attack (TIA), and cerebral infarction without residual deficits: Secondary | ICD-10-CM | POA: Diagnosis not present

## 2023-09-17 LAB — DIFFERENTIAL
Abs Immature Granulocytes: 0.01 K/uL (ref 0.00–0.07)
Basophils Absolute: 0 K/uL (ref 0.0–0.1)
Basophils Relative: 1 %
Eosinophils Absolute: 0 K/uL (ref 0.0–0.5)
Eosinophils Relative: 1 %
Immature Granulocytes: 0 %
Lymphocytes Relative: 35 %
Lymphs Abs: 1.4 K/uL (ref 0.7–4.0)
Monocytes Absolute: 0.5 K/uL (ref 0.1–1.0)
Monocytes Relative: 12 %
Neutro Abs: 2.1 K/uL (ref 1.7–7.7)
Neutrophils Relative %: 51 %

## 2023-09-17 LAB — COMPREHENSIVE METABOLIC PANEL WITH GFR
ALT: 13 U/L (ref 0–44)
AST: 20 U/L (ref 15–41)
Albumin: 3.8 g/dL (ref 3.5–5.0)
Alkaline Phosphatase: 80 U/L (ref 38–126)
Anion gap: 10 (ref 5–15)
BUN: 16 mg/dL (ref 6–20)
CO2: 23 mmol/L (ref 22–32)
Calcium: 9.2 mg/dL (ref 8.9–10.3)
Chloride: 106 mmol/L (ref 98–111)
Creatinine, Ser: 0.87 mg/dL (ref 0.44–1.00)
GFR, Estimated: >60 mL/min (ref >60)
Glucose, Bld: 81 mg/dL (ref 70–99)
Potassium: 3.7 mmol/L (ref 3.5–5.1)
Sodium: 139 mmol/L (ref 135–145)
Total Bilirubin: 0.6 mg/dL (ref 0.0–1.2)
Total Protein: 7.4 g/dL (ref 6.5–8.1)

## 2023-09-17 LAB — I-STAT CHEM 8, ED
BUN: 17 mg/dL (ref 6–20)
Calcium, Ion: 1.13 mmol/L — ABNORMAL LOW (ref 1.15–1.40)
Chloride: 107 mmol/L (ref 98–111)
Creatinine, Ser: 1 mg/dL (ref 0.44–1.00)
Glucose, Bld: 79 mg/dL (ref 70–99)
HCT: 38 % (ref 36.0–46.0)
Hemoglobin: 12.9 g/dL (ref 12.0–15.0)
Potassium: 3.7 mmol/L (ref 3.5–5.1)
Sodium: 140 mmol/L (ref 135–145)
TCO2: 22 mmol/L (ref 22–32)

## 2023-09-17 LAB — CBC
HCT: 38.7 % (ref 36.0–46.0)
Hemoglobin: 12.9 g/dL (ref 12.0–15.0)
MCH: 28.1 pg (ref 26.0–34.0)
MCHC: 33.3 g/dL (ref 30.0–36.0)
MCV: 84.3 fL (ref 80.0–100.0)
Platelets: 96 K/uL — ABNORMAL LOW (ref 150–400)
RBC: 4.59 MIL/uL (ref 3.87–5.11)
RDW: 13 % (ref 11.5–15.5)
WBC: 4.1 K/uL (ref 4.0–10.5)
nRBC: 0 % (ref 0.0–0.2)

## 2023-09-17 LAB — HCG, SERUM, QUALITATIVE: Preg, Serum: NEGATIVE

## 2023-09-17 LAB — PROTIME-INR
INR: 1.1 (ref 0.8–1.2)
Prothrombin Time: 15.3 s — ABNORMAL HIGH (ref 11.4–15.2)

## 2023-09-17 LAB — APTT: aPTT: 70 s — ABNORMAL HIGH (ref 24–36)

## 2023-09-17 LAB — CBG MONITORING, ED: Glucose-Capillary: 90 mg/dL (ref 70–99)

## 2023-09-17 LAB — ETHANOL: Alcohol, Ethyl (B): <15 mg/dL (ref <15)

## 2023-09-17 NOTE — ED Triage Notes (Addendum)
 Pt arrived from home via POV c/o headache 10/10 accompanied with LUQ pain 10/10. Emesis x 2 today.the patient. Pt states that the pain started yesterday. Pt hx stroke. NIH current 3 . LKW 09/16/2023 2100.

## 2023-09-18 MED ORDER — LACTATED RINGERS IV BOLUS
1000.0000 mL | Freq: Once | INTRAVENOUS | Status: AC
  Administered 2023-09-18: 1000 mL via INTRAVENOUS

## 2023-09-18 MED ORDER — PROCHLORPERAZINE EDISYLATE 10 MG/2ML IJ SOLN
10.0000 mg | Freq: Once | INTRAMUSCULAR | Status: AC
  Administered 2023-09-18: 10 mg via INTRAVENOUS
  Filled 2023-09-18: qty 2

## 2023-09-18 MED ORDER — ESCITALOPRAM OXALATE 10 MG PO TABS
10.0000 mg | ORAL_TABLET | Freq: Every day | ORAL | Status: DC
  Administered 2023-09-18: 10 mg via ORAL
  Filled 2023-09-18: qty 1

## 2023-09-18 MED ORDER — APIXABAN 5 MG PO TABS
5.0000 mg | ORAL_TABLET | Freq: Two times a day (BID) | ORAL | Status: DC
  Administered 2023-09-18: 5 mg via ORAL
  Filled 2023-09-18: qty 1

## 2023-09-18 MED ORDER — BUTALBITAL-APAP-CAFFEINE 50-325-40 MG PO TABS
2.0000 | ORAL_TABLET | Freq: Once | ORAL | Status: AC
  Administered 2023-09-18: 2 via ORAL
  Filled 2023-09-18: qty 2

## 2023-09-18 MED ORDER — GABAPENTIN 100 MG PO CAPS
200.0000 mg | ORAL_CAPSULE | Freq: Three times a day (TID) | ORAL | Status: DC
  Administered 2023-09-18: 200 mg via ORAL
  Filled 2023-09-18: qty 2

## 2023-09-18 MED ORDER — ATORVASTATIN CALCIUM 40 MG PO TABS
20.0000 mg | ORAL_TABLET | Freq: Every day | ORAL | Status: DC
  Administered 2023-09-18: 20 mg via ORAL
  Filled 2023-09-18: qty 1

## 2023-09-18 MED ORDER — CLONAZEPAM 0.5 MG PO TABS: 0.5000 mg | ORAL_TABLET | Freq: Every day | ORAL | Status: DC

## 2023-09-18 NOTE — ED Provider Notes (Signed)
 Received patient in turnover from Dr. Garrick.  Please see their note for further details of Hx, PE.  Briefly patient is a 23 y.o. female with a Headache and Emesis .  Patient unfortunately with history of multiple strokes in the past history of lupus and noncompliant with her medications chronic headaches.  And CT without obvious acute finding.  Plan for headache treatment and reassessment if improvement plan for discharge home with outpatient follow-up.  Headache has improved.  Patient is comfortable going home.    Emil Share, DO 09/18/23 [phone]

## 2023-09-18 NOTE — Discharge Instructions (Addendum)
 Please remember to take your medications.  Please follow-up with your neurologist in the office.

## 2023-09-18 NOTE — ED Provider Notes (Signed)
 [place] EMERGENCY DEPARTMENT AT [facility] Provider Note   CSN: [id number] Arrival date & time: 09/17/23  8046     Patient presents with: Headache and Emesis   Jacqueline [name] is a 23 y.o. female.   HPI Patient with multiple problems, including recent hospitalization presents with headache, nausea, vomiting.  Patient states that since discharge she continues to have a headache that was present prior to that admission, has nausea, vomiting, had abdominal pain.  Patient arrived overnight, but at time of evaluation labs reveal on the patient's abdominal pain, nausea, vomiting of all resolved.  She continues to have diffuse headache however. She states that she has been taking her medication as directed, but her note from admission notes difficulty with obtaining her medication and she cannot specify where she gets refills or continuity of care.    Prior to Admission medications   Medication Sig Start Date End Date Taking? Authorizing Provider  apixaban  (ELIQUIS ) 5 MG TABS tablet Take 2 tablets (10 mg total) by mouth 2 (two) times daily for 1 day, THEN 1 tablet (5 mg total) 2 (two) times daily. 09/16/23 12/16/23  Will Almarie MATSU, MD  atorvastatin  (LIPITOR) 20 MG tablet Take 1 tablet (20 mg total) by mouth daily. 09/16/23   Will Almarie MATSU, MD  clonazePAM  (KLONOPIN ) 0.5 MG tablet Take 1 tablet (0.5 mg total) by mouth at bedtime. 09/16/23   Will Almarie MATSU, MD  escitalopram  (LEXAPRO ) 10 MG tablet Take 0.5 tablets (5 mg total) by mouth daily for 7 days, THEN 1 tablet (10 mg total) daily. 09/16/23 11/22/23  Will Almarie MATSU, MD  gabapentin  (NEURONTIN ) 100 MG capsule Take 2 capsules (200 mg total) by mouth 3 (three) times daily. 09/16/23   Will Almarie MATSU, MD    Allergies: Patient has no known allergies.    Review of Systems  Updated Vital Signs BP 100/68 (BP Location: Left Arm)   Pulse 90   Temp 97.9 F (36.6 C)   Resp 17   Ht 5' 4 (1.626 m)   Wt 87.1  kg   LMP 09/09/2023 Comment: negative HCG 09/09/23  SpO2 100%   Breastfeeding No   BMI 32.96 kg/m   Physical Exam Vitals and nursing note reviewed.  Constitutional:      General: She is not in acute distress.    Appearance: She is well-developed.  HENT:     Head: Normocephalic and atraumatic.  Eyes:     Conjunctiva/sclera: Conjunctivae normal.  Cardiovascular:     Rate and Rhythm: Normal rate and regular rhythm.  Pulmonary:     Effort: Pulmonary effort is normal. No respiratory distress.     Breath sounds: Normal breath sounds. No stridor.  Abdominal:     General: There is no distension.  Skin:    General: Skin is warm and dry.  Neurological:     Mental Status: She is alert and oriented to person, place, and time.     Cranial Nerves: No cranial nerve deficit.  Psychiatric:        Mood and Affect: Mood normal.     (all labs ordered are listed, but only abnormal results are displayed) Labs Reviewed  PROTIME-INR - Abnormal; Notable for the following components:      Result Value   Prothrombin Time 15.3 (*)    All other components within normal limits  APTT - Abnormal; Notable for the following components:   aPTT 70 (*)    All other components within normal limits  CBC - Abnormal; Notable for the following components:   Platelets 96 (*)    All other components within normal limits  I-STAT CHEM 8, ED - Abnormal; Notable for the following components:   Calcium , Ion 1.13 (*)    All other components within normal limits  DIFFERENTIAL  COMPREHENSIVE METABOLIC PANEL WITH GFR  ETHANOL  HCG, SERUM, QUALITATIVE  CBG MONITORING, ED    EKG: None  Radiology: CT HEAD WO CONTRAST Result Date: 09/17/2023 EXAM: CT HEAD WITHOUT CONTRAST 09/17/2023 10:35:24 PM TECHNIQUE: CT of the head was performed without the administration of intravenous contrast. Automated exposure control, iterative reconstruction, and/or weight based adjustment of the mA/kV was utilized to reduce the  radiation dose to as low as reasonably achievable. COMPARISON: 09/15/2023 CLINICAL HISTORY: Headache, sudden, severe. FINDINGS: BRAIN AND VENTRICLES: No acute hemorrhage. Gray-Lafevers differentiation is preserved. No hydrocephalus. No extra-axial collection. No mass effect. Large area of encephalomalacia in the left frontal, parietal, and temporal lobes, unchanged from prior study. Redemonstrated acute-subacute infarct in the right frontal lobe Calame matter better appreciated on MRI Sep 13 2023. No evidence of new infarct. ORBITS: No acute abnormality. SINUSES: No acute abnormality. SOFT TISSUES AND SKULL: No acute soft tissue abnormality. No skull fracture. IMPRESSION: 1. No acute intracranial abnormality. 2. Evolving acute-subacute infarct in the right frontal lobe Speagle matter. No hemorrhagic transformation. 3. 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 Electronically signed by: Norman Gatlin MD 09/17/2023 10:47 PM EDT RP Workstation: [id number]     Procedures   Medications Ordered in the ED  apixaban  (ELIQUIS ) tablet 5 mg (has no administration in time range)  atorvastatin  (LIPITOR) tablet 20 mg (has no administration in time range)  clonazePAM  (KLONOPIN ) tablet 0.5 mg (has no administration in time range)  escitalopram  (LEXAPRO ) tablet 10 mg (has no administration in time range)  gabapentin  (NEURONTIN ) capsule 200 mg (has no administration in time range)  lactated ringers  bolus 1,000 mL (has no administration in time range)  prochlorperazine  (COMPAZINE ) injection 10 mg (has no administration in time range)                                    Medical Decision Making Young female with substantial medical problems including stroke, antiphospholipid antibody syndrome, THC abuse, presents after recent hospitalization during which she was found have new embolic stroke now with concern for ongoing headache, unchanged, as well as nausea, vomiting.  Broad differential  including progression of disease, hemorrhagic conversion, infection, dehydration, withdrawal from her medications as is unclear if she is taking these reliably. Patient had initial labs from triage, by the time my evaluation these are available, discussed at length.  Labs consistent with multiple prior studies, no acute changes.  Head CT with progression, not conversion of recent stroke.  With unremarkable neuroexam compared to prior, after thorough review of her chart, patient received analgesics, fluids, her home medications. Cardiac 90 sinus normal pulse ox 100% room air normal  Amount and/or Complexity of Data Reviewed External Data Reviewed: notes.    Details: Summary from last hospitalization reviewed Labs: ordered. Decision-making details documented in ED Course. Radiology: ordered and independent interpretation performed. Decision-making details documented in ED Course.  Risk Prescription drug management. Decision regarding hospitalization. Diagnosis or treatment significantly limited by social determinants of health.  3:17 PM Patient in no distress, vital signs remained stable. without evidence for acute phenomenon with notation of the patient's  chronic headaches, prior stroke, reassuring labs, vitals, the patient will follow-up with primary care.  She is awaiting provision of meds, may be appropriate for discharge, dispo pending on signout.   Final diagnoses:  Bad headache  Bilious vomiting with nausea    ED Discharge Orders     None          Garrick Charleston, MD 09/18/23 [phone]

## 2023-09-18 NOTE — ED Notes (Signed)
 Patient resting in bed with eyes closed.

## 2023-09-18 NOTE — ED Notes (Signed)
 Sister Deerica Waszak [phone] would like an update asap

## 2023-09-18 NOTE — ED Notes (Signed)
 Unsuccessful IV attempt x2.

## 2023-09-24 ENCOUNTER — Emergency Department (HOSPITAL_COMMUNITY)

## 2023-09-24 ENCOUNTER — Other Ambulatory Visit: Payer: Self-pay

## 2023-09-24 ENCOUNTER — Emergency Department (HOSPITAL_COMMUNITY)
Admission: EM | Admit: 2023-09-24 | Discharge: 2023-09-24 | Disposition: A | Discharge status: Home / Self Care | Attending: Emergency Medicine | Admitting: Emergency Medicine

## 2023-09-24 ENCOUNTER — Encounter (HOSPITAL_COMMUNITY): Payer: Self-pay

## 2023-09-24 DIAGNOSIS — Z79899 Other long term (current) drug therapy: Secondary | ICD-10-CM | POA: Insufficient documentation

## 2023-09-24 DIAGNOSIS — R079 Chest pain, unspecified: Secondary | ICD-10-CM | POA: Diagnosis not present

## 2023-09-24 DIAGNOSIS — Z7901 Long term (current) use of anticoagulants: Secondary | ICD-10-CM | POA: Insufficient documentation

## 2023-09-24 DIAGNOSIS — G43809 Other migraine, not intractable, without status migrainosus: Secondary | ICD-10-CM | POA: Insufficient documentation

## 2023-09-24 DIAGNOSIS — Z8673 Personal history of transient ischemic attack (TIA), and cerebral infarction without residual deficits: Secondary | ICD-10-CM | POA: Insufficient documentation

## 2023-09-24 DIAGNOSIS — R519 Headache, unspecified: Secondary | ICD-10-CM | POA: Diagnosis present

## 2023-09-24 LAB — BASIC METABOLIC PANEL WITH GFR
Anion gap: 8 (ref 5–15)
BUN: 12 mg/dL (ref 6–20)
CO2: 21 mmol/L — ABNORMAL LOW (ref 22–32)
Calcium: 8.8 mg/dL — ABNORMAL LOW (ref 8.9–10.3)
Chloride: 108 mmol/L (ref 98–111)
Creatinine, Ser: 0.88 mg/dL (ref 0.44–1.00)
GFR, Estimated: >60 mL/min (ref >60)
Glucose, Bld: 124 mg/dL — ABNORMAL HIGH (ref 70–99)
Potassium: 3.3 mmol/L — ABNORMAL LOW (ref 3.5–5.1)
Sodium: 137 mmol/L (ref 135–145)

## 2023-09-24 LAB — CBC WITH DIFFERENTIAL/PLATELET
Abs Immature Granulocytes: 0.01 K/uL (ref 0.00–0.07)
Basophils Absolute: 0 K/uL (ref 0.0–0.1)
Basophils Relative: 1 %
Eosinophils Absolute: 0 K/uL (ref 0.0–0.5)
Eosinophils Relative: 1 %
HCT: 34.6 % — ABNORMAL LOW (ref 36.0–46.0)
Hemoglobin: 12.2 g/dL (ref 12.0–15.0)
Immature Granulocytes: 0 %
Lymphocytes Relative: 36 %
Lymphs Abs: 1.6 K/uL (ref 0.7–4.0)
MCH: 29.3 pg (ref 26.0–34.0)
MCHC: 35.3 g/dL (ref 30.0–36.0)
MCV: 83.2 fL (ref 80.0–100.0)
Monocytes Absolute: 0.6 K/uL (ref 0.1–1.0)
Monocytes Relative: 14 %
Neutro Abs: 2.1 K/uL (ref 1.7–7.7)
Neutrophils Relative %: 48 %
Platelets: 73 K/uL — ABNORMAL LOW (ref 150–400)
RBC: 4.16 MIL/uL (ref 3.87–5.11)
RDW: 12.7 % (ref 11.5–15.5)
WBC: 4.3 K/uL (ref 4.0–10.5)
nRBC: 0 % (ref 0.0–0.2)

## 2023-09-24 LAB — TROPONIN I (HIGH SENSITIVITY): Troponin I (High Sensitivity): 3 ng/L (ref <18)

## 2023-09-24 LAB — HCG, SERUM, QUALITATIVE: Preg, Serum: NEGATIVE

## 2023-09-24 MED ORDER — BUTALBITAL-APAP-CAFFEINE 50-325-40 MG PO TABS: 1.0000 | ORAL_TABLET | Freq: Four times a day (QID) | ORAL | 0 refills | Status: DC | PRN

## 2023-09-24 MED ORDER — PROCHLORPERAZINE EDISYLATE 10 MG/2ML IJ SOLN
10.0000 mg | Freq: Once | INTRAMUSCULAR | Status: AC
  Administered 2023-09-24: 10 mg via INTRAVENOUS
  Filled 2023-09-24: qty 2

## 2023-09-24 MED ORDER — DIPHENHYDRAMINE HCL 50 MG/ML IJ SOLN
12.5000 mg | Freq: Once | INTRAMUSCULAR | Status: AC
  Administered 2023-09-24: 12.5 mg via INTRAVENOUS
  Filled 2023-09-24: qty 1

## 2023-09-24 MED ORDER — IOHEXOL 350 MG/ML SOLN
75.0000 mL | Freq: Once | INTRAVENOUS | Status: AC | PRN
Start: 2023-09-24 — End: 2023-09-24
  Administered 2023-09-24: 75 mL via INTRAVENOUS

## 2023-09-24 NOTE — Discharge Instructions (Addendum)
 Follow-up with your primary care doctor.  Follow-up with your neurologist.  Use Fioricet  as needed for headaches.  This medication is sedating so please be careful with its use.  You can use 500 mg of Tylenol  every 6 hours as needed for headache as well.

## 2023-09-24 NOTE — ED Provider Notes (Signed)
 [place] EMERGENCY DEPARTMENT AT [facility] Provider Note   CSN: [id number] Arrival date & time: 09/24/23  9864     Patient presents with: Headache   Jacqueline [name] is a 23 y.o. female.   Patient here with headache had a stroke here recently right-sided weakness at baseline.  She has had a headache since her stroke and discharge.  She is on Eliquis .  She is diagnosed with a blood clot recently as well.  Having some chest pain that started again today.  She has been compliant with her medications.  She denies any new weakness numbness tingling.  Denies any vision loss.  Chest pain started this morning denies any cough sputum production.  It is not worse with exertion.  History of abdominal pain and diarrhea.  The history is provided by the patient.       Prior to Admission medications   Medication Sig Start Date End Date Taking? Authorizing Provider  apixaban  (ELIQUIS ) 5 MG TABS tablet Take 2 tablets (10 mg total) by mouth 2 (two) times daily for 1 day, THEN 1 tablet (5 mg total) 2 (two) times daily. 09/16/23 12/16/23  Will Almarie MATSU, MD  atorvastatin  (LIPITOR) 20 MG tablet Take 1 tablet (20 mg total) by mouth daily. 09/16/23   Will Almarie MATSU, MD  clonazePAM  (KLONOPIN ) 0.5 MG tablet Take 1 tablet (0.5 mg total) by mouth at bedtime. 09/16/23   Will Almarie MATSU, MD  escitalopram  (LEXAPRO ) 10 MG tablet Take 0.5 tablets (5 mg total) by mouth daily for 7 days, THEN 1 tablet (10 mg total) daily. 09/16/23 11/22/23  Will Almarie MATSU, MD  gabapentin  (NEURONTIN ) 100 MG capsule Take 2 capsules (200 mg total) by mouth 3 (three) times daily. 09/16/23   Will Almarie MATSU, MD    Allergies: Patient has no known allergies.    Review of Systems  Updated Vital Signs BP 113/79   Pulse 87   Temp 98.9 F (37.2 C)   Resp 16   Ht 5' 4 (1.626 m)   Wt 87.1 kg   LMP 09/09/2023 (Exact Date) Comment: negative HCG 09/09/23  SpO2 99%   BMI 32.96 kg/m   Physical  Exam Vitals and nursing note reviewed.  Constitutional:      General: She is not in acute distress.    Appearance: She is well-developed.  HENT:     Head: Normocephalic and atraumatic.  Eyes:     Conjunctiva/sclera: Conjunctivae normal.  Cardiovascular:     Rate and Rhythm: Normal rate and regular rhythm.     Heart sounds: No murmur heard. Pulmonary:     Effort: Pulmonary effort is normal. No respiratory distress.     Breath sounds: Normal breath sounds.  Abdominal:     Palpations: Abdomen is soft.     Tenderness: There is no abdominal tenderness.  Musculoskeletal:        General: No swelling.     Cervical back: Neck supple.  Skin:    General: Skin is warm and dry.     Capillary Refill: Capillary refill takes less than 2 seconds.  Neurological:     Mental Status: She is alert.     Comments: Right upper extremity right lower extremity weakness but otherwise normal strength, normal sensation normal speech normal visual fields normal coordination  Psychiatric:        Mood and Affect: Mood normal.     (all labs ordered are listed, but only abnormal results are displayed) Labs Reviewed  CBC  WITH DIFFERENTIAL/PLATELET - Abnormal; Notable for the following components:      Result Value   HCT 34.6 (*)    Platelets 73 (*)    All other components within normal limits  BASIC METABOLIC PANEL WITH GFR - Abnormal; Notable for the following components:   Potassium 3.3 (*)    CO2 21 (*)    Glucose, Bld 124 (*)    Calcium  8.8 (*)    All other components within normal limits  HCG, SERUM, QUALITATIVE  TROPONIN I (HIGH SENSITIVITY)    EKG: EKG Interpretation Date/Time:  Wednesday September 24 2023 08:16:07 EDT Ventricular Rate:  74 PR Interval:  176 QRS Duration:  99 QT Interval:  402 QTC Calculation: 446 R Axis:   33  Text Interpretation: Sinus rhythm Confirmed by Ruthe Cornet [id number]) on 09/24/2023 8:20:57 AM  Radiology: MR BRAIN WO CONTRAST Result Date: 09/24/2023 CLINICAL  DATA:  Headache, increasing frequency or severity. Neuro deficit, acute, stroke suspected. History of antiphospholipid antibody syndrome and stroke with residual speech and right-sided deficits. EXAM: MRI HEAD WITHOUT CONTRAST TECHNIQUE: Multiplanar, multiecho pulse sequences of the brain and surrounding structures were obtained without intravenous contrast. COMPARISON:  Head CT 09/24/2023 and MRI 09/13/2023 FINDINGS: Brain: A large chronic left MCA infarct is again noted with associated chronic blood products. Heterogeneous diffusion weighted signal intensity within the infarct is unchanged. Associated wallerian degeneration extends into the brainstem, and there is ex vacuo dilatation of the left lateral ventricle. A smaller chronic infarct more anteriorly in the left frontal lobe is also unchanged. Restricted diffusion associated with the punctate acute infarct in the right frontal subcortical Roycroft matter on the recent prior MRI has resolved. No acute infarct, mass, midline shift, or extra-axial fluid collection is identified. Chronic ischemia/chronic lacunar infarcts are again noted in the subcortical and deep cerebral Benkert matter bilaterally. Vascular: Major intracranial vascular flow voids are preserved. Skull and upper cervical spine: Chronically diminished bone marrow T1 signal intensity diffusely, nonspecific though can be seen with anemia, smoking, and obesity. Sinuses/Orbits: Unremarkable orbits. The paranasal sinuses and mastoid air cells are well aerated. Other: None. IMPRESSION: 1. No acute intracranial abnormality. 2. Chronic ischemia as above including a large old left MCA infarct. Electronically Signed   By: Dasie Hamburg M.D.   On: 09/24/2023 10:18   CT Angio Chest PE W and/or Wo Contrast Result Date: 09/24/2023 CLINICAL DATA:  Chest pain. Clinical concern for pulmonary embolism. Documented pulmonary embolism on CT a chest 09/10/2023. EXAM: CT ANGIOGRAPHY CHEST WITH CONTRAST TECHNIQUE:  Multidetector CT imaging of the chest was performed using the standard protocol during bolus administration of intravenous contrast. Multiplanar CT image reconstructions and MIPs were obtained to evaluate the vascular anatomy. RADIATION DOSE REDUCTION: This exam was performed according to the departmental dose-optimization program which includes automated exposure control, adjustment of the mA and/or kV according to patient size and/or use of iterative reconstruction technique. CONTRAST:  75mL OMNIPAQUE  IOHEXOL  350 MG/ML SOLN COMPARISON:  09/10/2023 FINDINGS: Cardiovascular: The heart size is upper normal to borderline enlarged. No substantial pericardial effusion. No large central pulmonary embolus. The previously described subsegmental right lower lobe pulmonary embolus is less conspicuous today (see axial 138/8). No new acute pulmonary embolism on today's study. Mediastinum/Nodes: No mediastinal lymphadenopathy. There is no hilar lymphadenopathy. The esophagus has normal imaging features. There is no axillary lymphadenopathy. Lungs/Pleura: The lungs are clear without focal pneumonia, edema, pneumothorax or pleural effusion. Scattered areas of mosaic attenuation in the lungs diffusely is nonspecific but may reflect  sequelae of air trapping/small airways disease. No dense focal consolidative airspace opacity. No pulmonary edema or pleural effusion. Upper Abdomen: Visualized portion of the upper abdomen shows no acute findings. Musculoskeletal: No worrisome lytic or sclerotic osseous abnormality. Review of the MIP images confirms the above findings. IMPRESSION: 1. The previously described subsegmental right lower lobe pulmonary embolus is less conspicuous today. No new acute pulmonary embolism on today's study. 2. Scattered areas of mosaic attenuation in the lungs diffusely is nonspecific but may reflect sequelae of air trapping/small airways disease. Electronically Signed   By: Camellia Candle M.D.   On: 09/24/2023  09:57   CT Head Wo Contrast Result Date: 09/24/2023 EXAM: CT HEAD WITHOUT CONTRAST 09/24/2023 02:58:11 AM TECHNIQUE: CT of the head was performed without the administration of intravenous contrast. Automated exposure control, iterative reconstruction, and/or weight based adjustment of the mA/kV was utilized to reduce the radiation dose to as low as reasonably achievable. COMPARISON: 09/17/2023 CLINICAL HISTORY: Headache, no red flags; bad headaches, prior stroke x2. FINDINGS: BRAIN AND VENTRICLES: No acute hemorrhage. Gray-Croak differentiation is preserved. No hydrocephalus. No extra-axial collection. No mass effect or midline shift. Old left MCA (middle cerebral artery) territory infarct is unchanged. ORBITS: No acute abnormality. SINUSES: No acute abnormality. SOFT TISSUES AND SKULL: No acute soft tissue abnormality. No skull fracture. IMPRESSION: 1. No acute intracranial abnormality. 2. Unchanged old left MCA territory infarct. Electronically signed by: Franky Stanford MD 09/24/2023 03:06 AM EDT RP Workstation: [id number]     Procedures   Medications Ordered in the ED  prochlorperazine  (COMPAZINE ) injection 10 mg (10 mg Intravenous Given 09/24/23 0840)  diphenhydrAMINE  (BENADRYL ) injection 12.5 mg (12.5 mg Intravenous Given 09/24/23 0840)  iohexol  (OMNIPAQUE ) 350 MG/ML injection 75 mL (75 mLs Intravenous Contrast Given 09/24/23 0933)                                    Medical Decision Making Amount and/or Complexity of Data Reviewed Radiology: ordered.  Risk Prescription drug management.   Jacqueline [name] is here with chest pain and also headache.  Overall patient with history of stroke on Eliquis .  History of blood clot as well on Eliquis .  Recently discharged for stroke where she has right-sided weakness.  She has been having a headache daily since.  She has unremarkable vitals.  No fever.  Neurological exam seems to be at her baseline.  EKG shows sinus rhythm.  No ischemic changes.  Will  check basic labs troponin head CT MRI of the brain CT PE scan.  Head CT is unremarkable.  Basic labs are unremarkable per my review interpretation.  Awaiting troponin MRI and CT of the chest.  Headache cocktail given with Compazine  and Benadryl .  Overall I suspect migraine headache.  I suspect nonspecific chest pain but will make sure there is no propagation of PE.  Radiology report with MRI showing no acute findings on brain imaging.  No new strokes.  CT scan of the chest shows no evidence of new PE.  Somewhat improved PE from prior.  Headaches improved.  Will prescribe Fioricet  as needed.  Recommend that she follow-up with neurology as already scheduled for her stroke and they can further discuss headache/migraine care.  Patient discharged.  Troponin was normal.  Also no concern for ACS or other acute process.  This chart was dictated using voice recognition software.  Despite best efforts to proofread,  errors can occur which can  change the documentation meaning.      Final diagnoses:  Other migraine without status migrainosus, not intractable  Nonspecific chest pain    ED Discharge Orders     None          Ruthe Cornet, DO 09/24/23 1131

## 2023-09-24 NOTE — ED Triage Notes (Signed)
 Pt bib POV c/o headache started last night. Pounding in front. Pt has previous hx 2 CVA  Pt was told she couldn't take anything for the headache.   Pt states she has right sided deficits and speech issues from previous CVA

## 2023-09-29 ENCOUNTER — Encounter: Admitting: Family

## 2023-09-29 ENCOUNTER — Ambulatory Visit: Payer: Self-pay

## 2023-09-29 NOTE — Progress Notes (Signed)
 Erroneous encounter-disregard

## 2023-09-29 NOTE — Telephone Encounter (Signed)
 FYI Only or Action Required?: Action required by provider: request for appointment.  Patient was last seen in primary care on new pt.  Called Nurse Triage reporting Headache.  Symptoms began several months ago.  Interventions attempted: Nothing.  Symptoms are: unchanged.  Triage Disposition: See Physician Within 24 Hours  Patient/caregiver understands and will follow disposition?: UnsureCopied from CRM #[id number]. Topic: Clinical - Red Word Triage >> Sep 29, 2023  2:09 PM Fonda T wrote: Red Word that prompted transfer to Nurse Triage: Patient sister, Cynda, (patient is with sister) calling on behalf of patient, states she is experiencing worsening pain in her head, and headaches. States Has had history of previous stroke. Reason for Disposition  [1] MODERATE headache (e.g., interferes with normal activities) AND [2] present > 24 hours AND [3] unexplained  (Exceptions: Pain medicines not tried, typical migraine, or headache part of viral illness.)  Answer Assessment - Initial Assessment Questions 1. LOCATION: Where does it hurt?   Pt sister called to reschedule missed appt from earlier today. Pt has recently been seen in ED for headache. Headaches are still an issue. CAL was utilized to create new appt. Pt has history of stroke and unable to answer questions appropriately. Lost connect with CAL.  CAL is reaching out to pt.  Protocols used: [facility]

## 2023-10-03 ENCOUNTER — Encounter: Payer: Self-pay | Admitting: Hematology

## 2023-10-08 ENCOUNTER — Encounter: Payer: Self-pay | Admitting: Hematology

## 2023-10-08 ENCOUNTER — Ambulatory Visit: Payer: Self-pay | Admitting: Family Medicine

## 2023-10-08 ENCOUNTER — Inpatient Hospital Stay: Admitting: Family Medicine

## 2023-10-11 ENCOUNTER — Ambulatory Visit (HOSPITAL_COMMUNITY)
Admission: EM | Admit: 2023-10-11 | Discharge: 2023-10-11 | Disposition: A | Discharge status: Home / Self Care | Payer: Self-pay | Attending: Internal Medicine | Admitting: Internal Medicine

## 2023-10-11 ENCOUNTER — Encounter (HOSPITAL_COMMUNITY): Payer: Self-pay

## 2023-10-11 DIAGNOSIS — N898 Other specified noninflammatory disorders of vagina: Secondary | ICD-10-CM

## 2023-10-11 DIAGNOSIS — Z7901 Long term (current) use of anticoagulants: Secondary | ICD-10-CM

## 2023-10-11 DIAGNOSIS — N939 Abnormal uterine and vaginal bleeding, unspecified: Secondary | ICD-10-CM

## 2023-10-11 LAB — POCT URINALYSIS DIP (MANUAL ENTRY)
Bilirubin, UA: NEGATIVE
Glucose, UA: NEGATIVE mg/dL
Ketones, POC UA: NEGATIVE mg/dL
Nitrite, UA: NEGATIVE
Protein Ur, POC: NEGATIVE mg/dL
Spec Grav, UA: 1.01 (ref 1.010–1.025)
Urobilinogen, UA: 0.2 U/dL
pH, UA: 5.5 (ref 5.0–8.0)

## 2023-10-11 MED ORDER — NYSTATIN 100000 UNIT/GM EX CREA: TOPICAL_CREAM | CUTANEOUS | 0 refills | Status: DC

## 2023-10-11 NOTE — ED Notes (Signed)
 Attempted to collected patient blood x 4. Unable to collect.

## 2023-10-11 NOTE — ED Triage Notes (Signed)
 Patient presents with abdominal vaginal bleeding x 2 months. Patient states she is passing large amount blood clots.  Denies any other symptoms at this time.

## 2023-10-11 NOTE — Discharge Instructions (Addendum)
 Abnormal vaginal bleeding during menses.  This may be secondary to Eliquis  which is a blood thinner.  We have checked a urinalysis today.  We attempted to draw blood today but we were unable to get blood. Can return tomorrow after hydration and we can try again or blood work can be done at your primary care physician's appointment coming up.  Recommend calling neurology on Monday to let them know that there has been an increased and bleeding.  Keep appointment with primary care provider as scheduled as this may need more workup or imaging as an outpatient.  If symptoms worsen significantly then recommend going to the emergency room for further evaluation.  Can try nystatin  cream twice daily to the affected area. May want to consider using a barrier ointment such as Desitin to help with irritation.

## 2023-10-11 NOTE — ED Provider Notes (Signed)
 [facility]    CSN: [id number] Arrival date & time: 10/11/23  1537      History   Chief Complaint Chief Complaint  Patient presents with   Vaginal Bleeding    HPI Jacqueline [name] is a 23 y.o. female.   23 year old female who presents to urgent care with complaints of increased vaginal bleeding.  She reports that this been going on for about 2 months.  She reports that this is during her menses that she has increased bleeding.  She has large blood clots that will come out during that time.  She denies any dizziness.  She does not have any dysuria, any abdominal pain, fevers, chills.  She is on Eliquis  and reports that this dose was recently changed and that the bleeding seem to have started worse after this.  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  She also relates that the vaginal area has been very raw and tender to the touch.   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   Antiphospholipid antibody syndrome (HCC) 05/12/2023   Symptomatic anemia 05/07/2023   History of stroke 05/06/2023   Bacteremia 03/27/2023   SIRS (systemic inflammatory response syndrome) (HCC) 03/26/2023   Hyponatremia 03/26/2023   Ileus (HCC) 03/26/2023   Pain around PEG tube site, initial encounter 03/26/2023   Transaminitis 03/26/2023   History of pulmonary embolism 03/26/2023    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   Agitation 03/04/2023   Urinary tract infection without hematuria 03/04/2023   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/04/2023   Dysphagia 03/04/2023   Vitamin B12 deficiency 03/04/2023   Acute ischemic left MCA stroke (HCC) 02/21/2023   Middle cerebral artery embolism, left 02/21/2023   Community acquired pneumonia of right lower lobe of lung 04/10/2022   Iron  deficiency anemia 04/09/2022   Folic acid  deficiency 04/09/2022   Acute pulmonary embolism (HCC) 04/08/2022   Normocytic anemia 04/08/2022   Thrombocytopenia (HCC) 04/08/2022   Hypokalemia 04/08/2022   Moderate protein malnutrition (HCC) 04/08/2022   Nexplanon  insertion 08/05/2017   PPH (postpartum hemorrhage) 08/04/2017   VBAC (vaginal birth after Cesarean) 08/04/2017   Abnormal fetal position 07/09/2017   Limited prenatal care in third trimester 07/09/2017   Anemia in pregnancy 06/02/2017   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   Thrombocytopenia affecting pregnancy, antepartum (HCC) 03/06/2017   Supervision of other normal pregnancy, antepartum 02/28/2017   Biological false positive RPR test 02/28/2017   H/O cesarean section 02/28/2017   Mild tetrahydrocannabinol (THC) abuse 04/25/2016   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   Maternal varicella, non-immune 02/08/2016    Past Surgical History:  Procedure Laterality Date   CESAREAN SECTION N/A 09/04/2016   Procedure: CESAREAN SECTION;  Surgeon: [name], DO;  Location: [facility];  Service: Obstetrics;  Laterality: N/A;   CESAREAN SECTION Bilateral    IR CT HEAD  LTD  02/21/2023   IR GASTROSTOMY TUBE MOD SED  03/21/2023   IR GASTROSTOMY TUBE REMOVAL  05/14/2023   IR PERCUTANEOUS ART THROMBECTOMY/INFUSION INTRACRANIAL INC DIAG ANGIO  02/21/2023   IR REPLC GASTRO/COLONIC TUBE PERCUT W/FLUORO  03/27/2023   RADIOLOGY WITH ANESTHESIA N/A 02/21/2023   Procedure: IR WITH ANESTHESIA;  Surgeon:  Dolphus Carrion, MD;  Location: [facility];  Service: Radiology;  Laterality: N/A;    OB History     Gravida  2   Para  2   Term  2   Preterm      AB      Living  2      SAB      IAB      Ectopic      Multiple  0   Live Births  2            Home Medications    Prior to Admission medications   Medication Sig Start Date End Date Taking? Authorizing Provider  apixaban  (ELIQUIS ) 5 MG TABS tablet Take 2 tablets (10 mg total) by mouth 2 (two) times daily for 1 day, THEN 1 tablet (5 mg total) 2 (two) times daily. 09/16/23 12/16/23 Yes Will Almarie MATSU, MD  atorvastatin  (LIPITOR) 20 MG tablet Take 1 tablet (20 mg total) by mouth daily. 09/16/23  Yes Will Almarie MATSU, MD  butalbital -acetaminophen -caffeine  (FIORICET ) 50-325-40 MG tablet Take 1 tablet by mouth every 6 (six) hours as needed for headache. 09/24/23  Yes Curatolo, Adam, DO  clonazePAM  (KLONOPIN ) 0.5 MG tablet Take 1 tablet (0.5 mg total) by mouth at bedtime. 09/16/23  Yes Will Almarie MATSU, MD  escitalopram  (LEXAPRO ) 10 MG tablet Take 0.5 tablets (5 mg total) by mouth daily for 7 days, THEN 1 tablet (10 mg total) daily. 09/16/23 11/22/23 Yes Will Almarie MATSU, MD  gabapentin  (NEURONTIN ) 100 MG capsule Take 2 capsules (200 mg total) by mouth 3 (three) times daily. 09/16/23  Yes Will Almarie MATSU, MD    Family History Family History  Problem Relation Age of Onset   Hypertension Maternal Grandmother    Diabetes Maternal Grandmother    Cancer Neg Hx     Social History Social History   Tobacco Use   Smoking status: Former    Types: Cigars   Smokeless tobacco: Never   Tobacco comments:    1 B&M per day  Vaping Use   Vaping status: Never Used  Substance Use Topics   Alcohol use: Not Currently   Drug use: No     Allergies   Patient has no known allergies.   Review of Systems Review of Systems  Constitutional:  Negative for chills and fever.  HENT:  Negative for ear pain and sore throat.    Eyes:  Negative for pain and visual disturbance.  Respiratory:  Negative for cough and shortness of breath.   Cardiovascular:  Negative for chest pain and palpitations.  Gastrointestinal:  Negative for abdominal pain and vomiting.  Genitourinary:  Positive for vaginal bleeding and vaginal pain. Negative for dysuria and hematuria.  Musculoskeletal:  Negative for arthralgias and back pain.  Skin:  Negative for color change and rash.  Neurological:  Negative for seizures and syncope.  All other systems reviewed and are negative.    Physical Exam Triage Vital Signs ED Triage Vitals  Encounter Vitals Group     BP 10/11/23 1701 112/64     Girls Systolic BP Percentile --      Girls Diastolic BP Percentile --  Boys Systolic BP Percentile --      Boys Diastolic BP Percentile --      Pulse Rate 10/11/23 1701 92     Resp 10/11/23 1701 18     Temp 10/11/23 1701 98.1 F (36.7 C)     Temp Source 10/11/23 1701 Oral     SpO2 10/11/23 1701 98 %     Weight --      Height --      Head Circumference --      Peak Flow --      Pain Score 10/11/23 1719 0     Pain Loc --      Pain Education --      Exclude from Growth Chart --    No data found.  Updated Vital Signs BP 112/64 (BP Location: Left Arm)   Pulse 92   Temp 98.1 F (36.7 C) (Oral)   Resp 18   LMP 10/11/2023 (Exact Date) Comment: negative HCG 09/09/23  SpO2 98%   Visual Acuity Right Eye Distance:   Left Eye Distance:   Bilateral Distance:    Right Eye Near:   Left Eye Near:    Bilateral Near:     Physical Exam Vitals and nursing note reviewed. Exam conducted with a chaperone present.  Constitutional:      General: She is not in acute distress.    Appearance: She is well-developed.  HENT:     Head: Normocephalic and atraumatic.  Eyes:     Conjunctiva/sclera: Conjunctivae normal.  Cardiovascular:     Rate and Rhythm: Normal rate and regular rhythm.     Heart sounds: No murmur heard. Pulmonary:     Effort:  Pulmonary effort is normal. No respiratory distress.     Breath sounds: Normal breath sounds.  Abdominal:     Palpations: Abdomen is soft.     Tenderness: There is no abdominal tenderness.  Genitourinary:    Comments: Labial area is tender to the touch but no fluctuance or erythema seen, no swelling.  There is bleeding present from the vaginal region.  A clot is seen at the vaginal opening. Musculoskeletal:        General: No swelling.     Cervical back: Neck supple.  Skin:    General: Skin is warm and dry.     Capillary Refill: Capillary refill takes less than 2 seconds.  Neurological:     Mental Status: She is alert.  Psychiatric:        Mood and Affect: Mood normal.      UC Treatments / Results  Labs (all labs ordered are listed, but only abnormal results are displayed) Labs Reviewed  COMPREHENSIVE METABOLIC PANEL WITH GFR  CBC  POCT URINALYSIS DIP (MANUAL ENTRY)    EKG   Radiology No results found.  Procedures Procedures (including critical care time)  Medications Ordered in UC Medications - No data to display  Initial Impression / Assessment and Plan / UC Course  I have reviewed the triage vital signs and the nursing notes.  Pertinent labs & imaging results that were available during my care of the patient were reviewed by me and considered in my medical decision making (see chart for details).     Vaginal bleeding - Plan: POC urinalysis dipstick, POC urinalysis dipstick  Abnormal vaginal bleeding  Long term current use of anticoagulant therapy   Abnormal vaginal bleeding during menses.  This may be secondary to Eliquis  which is a blood thinner.  We have checked  a urinalysis today.  We will draw blood today and check a complete blood count and complete metabolic panel.  These results will take 24 to 48 hours.  Pending these results, if there is any further treatment needed or any significant abnormalities we will contact you.  Recommend calling neurology on  Monday to let them know that there has been an increased and bleeding.  We will schedule you for a primary care provider appointment as well as this may need more workup or imaging as an outpatient.  If symptoms worsen significantly then recommend going to the emergency room for further evaluation.  Final Clinical Impressions(s) / UC Diagnoses   Final diagnoses:  Vaginal bleeding  Abnormal vaginal bleeding  Long term current use of anticoagulant therapy     Discharge Instructions      Abnormal vaginal bleeding during menses.  This may be secondary to Eliquis  which is a blood thinner.  We have checked a urinalysis today.  We will draw blood today and check a complete blood count and complete metabolic panel.  These results will take 24 to 48 hours.  Pending these results, if there is any further treatment needed or any significant abnormalities we will contact you.  Recommend calling neurology on Monday to let them know that there has been an increased and bleeding.  We will schedule you for a primary care provider appointment as well as this may need more workup or imaging as an outpatient.  If symptoms worsen significantly then recommend going to the emergency room for further evaluation.    ED Prescriptions   None    PDMP not reviewed this encounter.   Naliyah, Neth, [place] 10/11/23 1750

## 2023-10-17 ENCOUNTER — Other Ambulatory Visit: Payer: Self-pay

## 2023-10-17 ENCOUNTER — Encounter (HOSPITAL_COMMUNITY): Payer: Self-pay | Admitting: *Deleted

## 2023-10-17 ENCOUNTER — Inpatient Hospital Stay (HOSPITAL_COMMUNITY)
Admission: EM | Admit: 2023-10-17 | Discharge: 2023-10-26 | DRG: 103 | Disposition: A | Discharge status: Home / Self Care | Payer: Self-pay | Attending: Student | Admitting: Student

## 2023-10-17 DIAGNOSIS — I63512 Cerebral infarction due to unspecified occlusion or stenosis of left middle cerebral artery: Secondary | ICD-10-CM

## 2023-10-17 DIAGNOSIS — E871 Hypo-osmolality and hyponatremia: Secondary | ICD-10-CM | POA: Diagnosis present

## 2023-10-17 DIAGNOSIS — M79604 Pain in right leg: Secondary | ICD-10-CM | POA: Diagnosis present

## 2023-10-17 DIAGNOSIS — Z5900 Homelessness unspecified: Secondary | ICD-10-CM

## 2023-10-17 DIAGNOSIS — Z86711 Personal history of pulmonary embolism: Secondary | ICD-10-CM

## 2023-10-17 DIAGNOSIS — N920 Excessive and frequent menstruation with regular cycle: Secondary | ICD-10-CM | POA: Diagnosis present

## 2023-10-17 DIAGNOSIS — Z6832 Body mass index (BMI) 32.0-32.9, adult: Secondary | ICD-10-CM

## 2023-10-17 DIAGNOSIS — Z8673 Personal history of transient ischemic attack (TIA), and cerebral infarction without residual deficits: Secondary | ICD-10-CM

## 2023-10-17 DIAGNOSIS — I69320 Aphasia following cerebral infarction: Secondary | ICD-10-CM

## 2023-10-17 DIAGNOSIS — D6862 Lupus anticoagulant syndrome: Secondary | ICD-10-CM | POA: Diagnosis present

## 2023-10-17 DIAGNOSIS — F1210 Cannabis abuse, uncomplicated: Secondary | ICD-10-CM | POA: Diagnosis present

## 2023-10-17 DIAGNOSIS — I6602 Occlusion and stenosis of left middle cerebral artery: Secondary | ICD-10-CM | POA: Diagnosis present

## 2023-10-17 DIAGNOSIS — D689 Coagulation defect, unspecified: Secondary | ICD-10-CM | POA: Diagnosis present

## 2023-10-17 DIAGNOSIS — Z79899 Other long term (current) drug therapy: Secondary | ICD-10-CM

## 2023-10-17 DIAGNOSIS — M7989 Other specified soft tissue disorders: Secondary | ICD-10-CM | POA: Diagnosis present

## 2023-10-17 DIAGNOSIS — N83201 Unspecified ovarian cyst, right side: Secondary | ICD-10-CM | POA: Diagnosis present

## 2023-10-17 DIAGNOSIS — E876 Hypokalemia: Secondary | ICD-10-CM

## 2023-10-17 DIAGNOSIS — G43011 Migraine without aura, intractable, with status migrainosus: Principal | ICD-10-CM | POA: Diagnosis present

## 2023-10-17 DIAGNOSIS — R9389 Abnormal findings on diagnostic imaging of other specified body structures: Secondary | ICD-10-CM

## 2023-10-17 DIAGNOSIS — E66811 Obesity, class 1: Secondary | ICD-10-CM | POA: Diagnosis present

## 2023-10-17 DIAGNOSIS — G43919 Migraine, unspecified, intractable, without status migrainosus: Secondary | ICD-10-CM | POA: Diagnosis present

## 2023-10-17 DIAGNOSIS — M79605 Pain in left leg: Secondary | ICD-10-CM | POA: Diagnosis present

## 2023-10-17 DIAGNOSIS — D6861 Antiphospholipid syndrome: Secondary | ICD-10-CM | POA: Diagnosis present

## 2023-10-17 DIAGNOSIS — K5900 Constipation, unspecified: Secondary | ICD-10-CM | POA: Diagnosis present

## 2023-10-17 DIAGNOSIS — D500 Iron deficiency anemia secondary to blood loss (chronic): Secondary | ICD-10-CM | POA: Diagnosis present

## 2023-10-17 DIAGNOSIS — Z7901 Long term (current) use of anticoagulants: Secondary | ICD-10-CM

## 2023-10-17 DIAGNOSIS — D696 Thrombocytopenia, unspecified: Secondary | ICD-10-CM | POA: Diagnosis present

## 2023-10-17 DIAGNOSIS — I6389 Other cerebral infarction: Secondary | ICD-10-CM

## 2023-10-17 DIAGNOSIS — Z8249 Family history of ischemic heart disease and other diseases of the circulatory system: Secondary | ICD-10-CM

## 2023-10-17 DIAGNOSIS — Z87891 Personal history of nicotine dependence: Secondary | ICD-10-CM

## 2023-10-17 DIAGNOSIS — Z91148 Patient's other noncompliance with medication regimen for other reason: Secondary | ICD-10-CM

## 2023-10-17 DIAGNOSIS — Z833 Family history of diabetes mellitus: Secondary | ICD-10-CM

## 2023-10-17 DIAGNOSIS — G43811 Other migraine, intractable, with status migrainosus: Principal | ICD-10-CM

## 2023-10-17 DIAGNOSIS — Z515 Encounter for palliative care: Secondary | ICD-10-CM

## 2023-10-17 NOTE — ED Triage Notes (Signed)
 The pt has an abrasion between her breasts that she does not know how it got there headache and her period is longer than usual  lmp now

## 2023-10-18 ENCOUNTER — Emergency Department (HOSPITAL_COMMUNITY): Payer: Self-pay

## 2023-10-18 DIAGNOSIS — D696 Thrombocytopenia, unspecified: Secondary | ICD-10-CM

## 2023-10-18 DIAGNOSIS — G43919 Migraine, unspecified, intractable, without status migrainosus: Secondary | ICD-10-CM | POA: Diagnosis present

## 2023-10-18 DIAGNOSIS — M7918 Myalgia, other site: Secondary | ICD-10-CM

## 2023-10-18 DIAGNOSIS — I2699 Other pulmonary embolism without acute cor pulmonale: Secondary | ICD-10-CM

## 2023-10-18 DIAGNOSIS — N920 Excessive and frequent menstruation with regular cycle: Secondary | ICD-10-CM

## 2023-10-18 DIAGNOSIS — G43809 Other migraine, not intractable, without status migrainosus: Secondary | ICD-10-CM

## 2023-10-18 LAB — CBC
HCT: 30.9 % — ABNORMAL LOW (ref 36.0–46.0)
Hemoglobin: 10.6 g/dL — ABNORMAL LOW (ref 12.0–15.0)
MCH: 27.8 pg (ref 26.0–34.0)
MCHC: 34.3 g/dL (ref 30.0–36.0)
MCV: 81.1 fL (ref 80.0–100.0)
Platelets: 70 K/uL — ABNORMAL LOW (ref 150–400)
RBC: 3.81 MIL/uL — ABNORMAL LOW (ref 3.87–5.11)
RDW: 13 % (ref 11.5–15.5)
WBC: 4.5 K/uL (ref 4.0–10.5)
nRBC: 0 % (ref 0.0–0.2)

## 2023-10-18 LAB — COMPREHENSIVE METABOLIC PANEL WITH GFR
ALT: 12 U/L (ref 0–44)
AST: 19 U/L (ref 15–41)
Albumin: 3.6 g/dL (ref 3.5–5.0)
Alkaline Phosphatase: 76 U/L (ref 38–126)
Anion gap: 9 (ref 5–15)
BUN: 8 mg/dL (ref 6–20)
CO2: 21 mmol/L — ABNORMAL LOW (ref 22–32)
Calcium: 8.7 mg/dL — ABNORMAL LOW (ref 8.9–10.3)
Chloride: 104 mmol/L (ref 98–111)
Creatinine, Ser: 0.81 mg/dL (ref 0.44–1.00)
GFR, Estimated: >60 mL/min (ref >60)
Glucose, Bld: 104 mg/dL — ABNORMAL HIGH (ref 70–99)
Potassium: 3 mmol/L — ABNORMAL LOW (ref 3.5–5.1)
Sodium: 134 mmol/L — ABNORMAL LOW (ref 135–145)
Total Bilirubin: 0.4 mg/dL (ref 0.0–1.2)
Total Protein: 7.1 g/dL (ref 6.5–8.1)

## 2023-10-18 LAB — MAGNESIUM: Magnesium: 1.8 mg/dL (ref 1.7–2.4)

## 2023-10-18 LAB — LIPASE, BLOOD: Lipase: 28 U/L (ref 11–51)

## 2023-10-18 LAB — HCG, SERUM, QUALITATIVE: Preg, Serum: NEGATIVE

## 2023-10-18 MED ORDER — MORPHINE SULFATE (PF) 4 MG/ML IV SOLN: 4.0000 mg | Freq: Once | INTRAVENOUS | Status: DC

## 2023-10-18 MED ORDER — ONDANSETRON HCL 4 MG/2ML IJ SOLN: 4.0000 mg | Freq: Four times a day (QID) | INTRAMUSCULAR | Status: DC | PRN

## 2023-10-18 MED ORDER — MAGNESIUM SULFATE 2 GM/50ML IV SOLN
2.0000 g | Freq: Three times a day (TID) | INTRAVENOUS | Status: AC
  Administered 2023-10-18 – 2023-10-19 (×3): 2 g via INTRAVENOUS
  Filled 2023-10-18 (×3): qty 50

## 2023-10-18 MED ORDER — CYCLOBENZAPRINE HCL 10 MG PO TABS
10.0000 mg | ORAL_TABLET | Freq: Once | ORAL | Status: AC
  Administered 2023-10-18: 10 mg via ORAL
  Filled 2023-10-18: qty 1

## 2023-10-18 MED ORDER — FENTANYL CITRATE PF 50 MCG/ML IJ SOSY
50.0000 ug | PREFILLED_SYRINGE | Freq: Once | INTRAMUSCULAR | Status: AC
  Administered 2023-10-18: 50 ug via INTRAVENOUS
  Filled 2023-10-18: qty 1

## 2023-10-18 MED ORDER — ACETAMINOPHEN 325 MG PO TABS
650.0000 mg | ORAL_TABLET | Freq: Four times a day (QID) | ORAL | Status: DC | PRN
  Administered 2023-10-19 – 2023-10-20 (×4): 650 mg via ORAL
  Filled 2023-10-18 (×4): qty 2

## 2023-10-18 MED ORDER — DEXAMETHASONE SODIUM PHOSPHATE 4 MG/ML IJ SOLN
4.0000 mg | Freq: Once | INTRAMUSCULAR | Status: DC
  Filled 2023-10-18: qty 1

## 2023-10-18 MED ORDER — ACETAMINOPHEN 325 MG PO TABS
650.0000 mg | ORAL_TABLET | Freq: Once | ORAL | Status: AC
  Administered 2023-10-18: 650 mg via ORAL
  Filled 2023-10-18: qty 2

## 2023-10-18 MED ORDER — VALPROATE SODIUM 100 MG/ML IV SOLN
500.0000 mg | Freq: Three times a day (TID) | INTRAVENOUS | Status: AC
  Administered 2023-10-18 – 2023-10-19 (×2): 500 mg via INTRAVENOUS
  Filled 2023-10-18: qty 5
  Filled 2023-10-18: qty 500
  Filled 2023-10-18 (×2): qty 5

## 2023-10-18 MED ORDER — PROCHLORPERAZINE EDISYLATE 10 MG/2ML IJ SOLN
10.0000 mg | Freq: Once | INTRAMUSCULAR | Status: AC
  Administered 2023-10-18: 10 mg via INTRAVENOUS
  Filled 2023-10-18: qty 2

## 2023-10-18 MED ORDER — PROMETHAZINE HCL 12.5 MG PO TABS
12.5000 mg | ORAL_TABLET | Freq: Four times a day (QID) | ORAL | Status: DC | PRN
  Administered 2023-10-19: 12.5 mg via ORAL
  Filled 2023-10-18: qty 1

## 2023-10-18 MED ORDER — SODIUM CHLORIDE 0.9 % IV SOLN: INTRAVENOUS | Status: AC

## 2023-10-18 MED ORDER — SODIUM CHLORIDE 0.9 % IV BOLUS
1000.0000 mL | Freq: Once | INTRAVENOUS | Status: AC
  Administered 2023-10-18: 1000 mL via INTRAVENOUS

## 2023-10-18 MED ORDER — DEXAMETHASONE SODIUM PHOSPHATE 4 MG/ML IJ SOLN
4.0000 mg | Freq: Once | INTRAMUSCULAR | Status: AC
  Administered 2023-10-18: 4 mg via INTRAVENOUS

## 2023-10-18 MED ORDER — ALBUTEROL SULFATE (2.5 MG/3ML) 0.083% IN NEBU: 2.5000 mg | INHALATION_SOLUTION | RESPIRATORY_TRACT | Status: DC | PRN

## 2023-10-18 MED ORDER — DIPHENHYDRAMINE HCL 50 MG/ML IJ SOLN
25.0000 mg | Freq: Once | INTRAMUSCULAR | Status: AC
  Administered 2023-10-18: 25 mg via INTRAVENOUS
  Filled 2023-10-18: qty 1

## 2023-10-18 MED ORDER — POTASSIUM CHLORIDE CRYS ER 20 MEQ PO TBCR
40.0000 meq | EXTENDED_RELEASE_TABLET | Freq: Once | ORAL | Status: AC
  Administered 2023-10-18: 40 meq via ORAL
  Filled 2023-10-18: qty 2

## 2023-10-18 MED ORDER — MAGNESIUM SULFATE IN D5W 1-5 GM/100ML-% IV SOLN
1.0000 g | Freq: Once | INTRAVENOUS | Status: AC
  Administered 2023-10-18: 1 g via INTRAVENOUS
  Filled 2023-10-18: qty 100

## 2023-10-18 MED ORDER — ACETAMINOPHEN 650 MG RE SUPP
650.0000 mg | Freq: Four times a day (QID) | RECTAL | Status: DC | PRN
Start: 2023-10-18 — End: 2023-10-21

## 2023-10-18 MED ORDER — METOPROLOL TARTRATE 5 MG/5ML IV SOLN: 5.0000 mg | Freq: Four times a day (QID) | INTRAVENOUS | Status: DC | PRN

## 2023-10-18 MED ORDER — MUSCLE RUB 10-15 % EX CREA
TOPICAL_CREAM | CUTANEOUS | Status: DC | PRN
  Filled 2023-10-18 (×2): qty 85

## 2023-10-18 NOTE — ED Provider Notes (Signed)
 [place] [facility] AT [facility] Provider Note   CSN: [id number] Arrival date & time: 10/17/23  2324     Patient presents with: No chief complaint on file.   Jacqueline [name] is a 23 y.o. female with PMHx migraines, CVA/PE with baseline right sided-weakness, menorrhagia who presents to ED with multiple complaints.   Patient with frontal headache. Has not taken anything for her pain. Patient requesting head CT d/t her hx of stroke. Endorses mild blurring of vision but denies vision loss.   Patient also with vaginal bleeding for at least 1 month. Denies abdominal pain. Denies nausea, vomiting, diarrhea. Denies fever. Patient with UC visit 8/9 for same complaint and had a reassuring pelvic exam and told to follow up with PCP.    HPI     Prior to Admission medications   Medication Sig Start Date End Date Taking? Authorizing Provider  apixaban  (ELIQUIS ) 5 MG TABS tablet Take 2 tablets (10 mg total) by mouth 2 (two) times daily for 1 day, THEN 1 tablet (5 mg total) 2 (two) times daily. 09/16/23 12/16/23  Will Almarie MATSU, MD  atorvastatin  (LIPITOR) 20 MG tablet Take 1 tablet (20 mg total) by mouth daily. 09/16/23   Will Almarie MATSU, MD  butalbital -acetaminophen -caffeine  (FIORICET ) 50-325-40 MG tablet Take 1 tablet by mouth every 6 (six) hours as needed for headache. 09/24/23   Curatolo, Adam, DO  clonazePAM  (KLONOPIN ) 0.5 MG tablet Take 1 tablet (0.5 mg total) by mouth at bedtime. 09/16/23   Will Almarie MATSU, MD  escitalopram  (LEXAPRO ) 10 MG tablet Take 0.5 tablets (5 mg total) by mouth daily for 7 days, THEN 1 tablet (10 mg total) daily. 09/16/23 11/22/23  Will Almarie MATSU, MD  gabapentin  (NEURONTIN ) 100 MG capsule Take 2 capsules (200 mg total) by mouth 3 (three) times daily. 09/16/23   Will Almarie MATSU, MD  nystatin  cream (MYCOSTATIN ) Apply to affected area 2 times daily 10/11/23   Teresa Almarie LABOR, PA-C    Allergies: Patient has no known allergies.     Review of Systems  Neurological:  Positive for headaches.    Updated Vital Signs BP 105/69 (BP Location: Left Arm)   Pulse 87   Temp 98.2 F (36.8 C)   Resp 20   Ht 5' 4 (1.626 m)   Wt 87.1 kg   LMP 10/11/2023 (Exact Date) Comment: negative HCG 09/09/23  SpO2 100%   BMI 32.96 kg/m   Physical Exam Vitals and nursing note reviewed.  Constitutional:      General: She is not in acute distress.    Appearance: She is not ill-appearing or toxic-appearing.  HENT:     Head: Normocephalic and atraumatic.     Mouth/Throat:     Mouth: Mucous membranes are moist.  Eyes:     General: No scleral icterus.       Right eye: No discharge.        Left eye: No discharge.     Conjunctiva/sclera: Conjunctivae normal.  Cardiovascular:     Rate and Rhythm: Normal rate and regular rhythm.     Pulses: Normal pulses.     Heart sounds: Normal heart sounds. No murmur heard. Pulmonary:     Effort: Pulmonary effort is normal. No respiratory distress.     Breath sounds: Normal breath sounds. No wheezing, rhonchi or rales.  Abdominal:     General: Abdomen is flat. Bowel sounds are normal. There is no distension.     Palpations: Abdomen is soft. There  is no mass.     Tenderness: There is no abdominal tenderness.  Musculoskeletal:     Right lower leg: No edema.     Left lower leg: No edema.  Skin:    General: Skin is warm and dry.     Findings: No rash.     Comments: Superficial abrasion on lower middle chest without swelling or surrounding erythema/purulence.  Neurological:     General: No focal deficit present.     Mental Status: She is alert and oriented to person, place, and time. Mental status is at baseline.     Comments: GCS 15. Speech is goal oriented. No deficits appreciated to CN III-XII. Chronic/baseline right arm weakness present - rest of major muscle groups 5/5 strength. Sensation to light touch intact. Patient moves extremities without ataxia.   Psychiatric:        Mood and  Affect: Mood normal.        Behavior: Behavior normal.     (all labs ordered are listed, but only abnormal results are displayed) Labs Reviewed  COMPREHENSIVE METABOLIC PANEL WITH GFR - Abnormal; Notable for the following components:      Result Value   Sodium 134 (*)    Potassium 3.0 (*)    CO2 21 (*)    Glucose, Bld 104 (*)    Calcium  8.7 (*)    All other components within normal limits  CBC - Abnormal; Notable for the following components:   RBC 3.81 (*)    Hemoglobin 10.6 (*)    HCT 30.9 (*)    Platelets 70 (*)    All other components within normal limits  LIPASE, BLOOD  HCG, SERUM, QUALITATIVE  URINALYSIS, ROUTINE W REFLEX MICROSCOPIC    EKG: None  Radiology: No results found.   .Critical Care  Performed by: Hoy Nidia FALCON, PA-C Authorized by: Hoy Nidia FALCON, PA-C   Critical care provider statement:    Critical care time (minutes):  30   Critical care was necessary to treat or prevent imminent or life-threatening deterioration of the following conditions: intractable migraine.   Critical care was time spent personally by me on the following activities:  Development of treatment plan with patient or surrogate, discussions with consultants, evaluation of patient's response to treatment, examination of patient, ordering and review of laboratory studies, ordering and review of radiographic studies, ordering and performing treatments and interventions, pulse oximetry, re-evaluation of patient's condition and review of old charts    Medications Ordered in the ED - No data to display                                  Medical Decision Making Amount and/or Complexity of Data Reviewed Labs: ordered. Radiology: ordered.  Risk OTC drugs. Prescription drug management.   This patient presents to the ED for concern of headache, this involves an extensive number of treatment options, and is a complaint that carries with it a high risk of complications and  morbidity.  The differential diagnosis includes migraine, tension headache, cluster headache, subarachnoid hemorrhage, meningitis/encephalitis, acute angle closure glaucoma, giant cell arteritis, idiopathic intercranial hypertension, ischemic stroke, ICH, cervical artery dissection   Co morbidities that complicate the patient evaluation  migraines, CVA/PE with baseline right sided-weakness, menorrhagia    Additional history obtained:  8/9: patient with reassuring pelvic exam and endorsed menorrhagia x2 months at that time.    Problem List / ED Course / Critical  interventions / Medication management  Patient presents to ED with migraine.  Neuroexam with patient's baseline right arm weakness.  Rest of physical/neuroexam reassuring.  Patient afebrile with stable vitals. I Ordered, and personally interpreted labs.  hCG negative.  CBC without leukocytosis.  There is mild anemia with hemoglobin at 10.6.  CMP with mild hypokalemia at 3.0.  There is also hyponatremia at 134.  CO2 is also mildly low at 21.  Magnesium  within normal limits.  Lipase within normal limits. I ordered imaging studies including MRI brain. I independently visualized and interpreted imaging which showed possible acute infarct. I agree with the radiologist interpretation. I requested consultation with the neurologist on-call Dr. Merrianne,  and discussed lab and imaging findings as well as pertinent plan - they are not so that patient is having an acute stroke today.  They do recommend NS bolus and magnesium  sulfate.  If patient is still having intractable migraine, she should be admitted for pain control. I have reviewed the patients home medicines and have made adjustments as needed   Social Determinants of Health:  none  3PM Care of Seidy A Lafosse transferred to [place] at the end of my shift as the patient will require reassessment once labs/imaging have resulted. Patient presentation, ED course, and plan of care discussed  with review of all pertinent labs and imaging. Please see his/her note for further details regarding further ED course and disposition. Plan at time of handoff is reassess patient after fluid bolus and magnesium .  If patient is still having severe migraine, she will need to be admitted for intractable migraine. This may be altered or completely changed at the discretion of the oncoming team pending results of further workup.      Final diagnoses:  None    ED Discharge Orders     None          Hoy Nidia FALCON, [place] 10/18/23 1514    Laurice Maude BROCKS, MD 10/18/23 1529

## 2023-10-18 NOTE — ED Provider Notes (Addendum)
 Pt here with headache, hx of migraine.  Persistent headache despite migraine cocktail.  Lindzen recommend NS and magnesium .  If sxs still persist then admit for intractable migraine.  Does not think pt has acute stroke.    EMR reviewed patient was admitted to the hospital in January 2025 for stroke symptoms and was found to have a left MCA.  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  Patient is currently on Eliquis .  As far as headache patient reports she has been doing with a right frontal headache ongoing for more than a week.  She feels more confused than usual.  She is having light sensitivity.  However she report no improvement of her headache despite receiving IV fluid as well as magnesium  that was given in the ED.  She also has received a migraine cocktail.  7:33 PM Patient had been monitored in the ED for over 20 hours.  She has received migraine cocktail as well as magnesium  and IV fluid and an additional medication for headache without adequate improvement.  I appreciate consultation from [facility] hospitalist Dr. Debby who agrees to admit patient.  Neurologist was consulted earlier and have it review MRI results and does not think patient has an acute stroke.  Patient will be admitted for intractable migraine.  9:03 PM Neurologist Dr. Almeda has seen and evaluated patient.  He agreed with possible admission for intractable migraine which he thought likely due to cervical myofascial cause.  He have provided recommendation for treatment which include migraine cocktail, topical anesthetic as well as muscle relaxant I have sent a secure epic message to [facility]  hospitalist Dr. Lauraine Debby to admit patient.  BP 95/63   Pulse 88   Temp 97.7 F (36.5 C) (Oral)   Resp 15   Ht 5' 4 (1.626 m)   Wt 87.1 kg   LMP 10/11/2023 (Exact Date) Comment: negative HCG 09/09/23  SpO2 100%   BMI 32.96 kg/m   Results for orders placed or performed during the  hospital encounter of 10/17/23  Lipase, blood   Collection Time: 10/18/23 12:01 AM  Result Value Ref Range   Lipase 28 11 - 51 U/L  Comprehensive metabolic panel   Collection Time: 10/18/23 12:01 AM  Result Value Ref Range   Sodium 134 (L) 135 - 145 mmol/L   Potassium 3.0 (L) 3.5 - 5.1 mmol/L   Chloride 104 98 - 111 mmol/L   CO2 21 (L) 22 - 32 mmol/L   Glucose, Bld 104 (H) 70 - 99 mg/dL   BUN 8 6 - 20 mg/dL   Creatinine, Ser 9.18 0.44 - 1.00 mg/dL   Calcium  8.7 (L) 8.9 - 10.3 mg/dL   Total Protein 7.1 6.5 - 8.1 g/dL   Albumin 3.6 3.5 - 5.0 g/dL   AST 19 15 - 41 U/L   ALT 12 0 - 44 U/L   Alkaline Phosphatase 76 38 - 126 U/L   Total Bilirubin 0.4 0.0 - 1.2 mg/dL   GFR, Estimated >39 >39 mL/min   Anion gap 9 5 - 15  CBC   Collection Time: 10/18/23 12:01 AM  Result Value Ref Range   WBC 4.5 4.0 - 10.5 K/uL   RBC 3.81 (L) 3.87 - 5.11 MIL/uL   Hemoglobin 10.6 (L) 12.0 - 15.0 g/dL   HCT 69.0 (L) 63.9 - 53.9 %   MCV 81.1 80.0 - 100.0 fL   MCH 27.8 26.0 - 34.0 pg   MCHC 34.3 30.0 -  36.0 g/dL   RDW 86.9 88.4 - 84.4 %   Platelets 70 (L) 150 - 400 K/uL   nRBC 0.0 0.0 - 0.2 %  hCG, serum, qualitative   Collection Time: 10/18/23 12:01 AM  Result Value Ref Range   Preg, Serum NEGATIVE NEGATIVE  Magnesium    Collection Time: 10/18/23 10:09 AM  Result Value Ref Range   Magnesium  1.8 1.7 - 2.4 mg/dL   MR BRAIN WO CONTRAST Result Date: 10/18/2023 EXAM: MRI BRAIN WITHOUT CONTRAST 10/18/2023 11:04:34 AM TECHNIQUE: Multiplanar multisequence MRI of the head/brain was performed without the administration of intravenous contrast. COMPARISON: 09/24/2023 and 09/13/2023 CLINICAL HISTORY: Headache, increasing frequency or severity. FINDINGS: BRAIN AND VENTRICLES: Punctate foci of cortical infarction are noted in the anterior right middle frontal lobe gyrus. Remote encephalomalacia with cortical laminar necrosis is present in the posterior left frontal and the parietal lobe. Mild ex vacuo dilation is  noted in the posterior left lateral ventricle. Periventricular Gest matter changes are moderately advanced for age, similar to the prior study. No acute hemorrhage. No mass. No midline shift. ORBITS: No acute abnormality. SINUSES AND MASTOIDS: No acute abnormality. BONES AND SOFT TISSUES: Normal marrow signal. No acute soft tissue abnormality. IMPRESSION: 1. No acute intracranial abnormality related to the clinical history of headache. 2. Punctate foci of cortical infarction in the anterior right middle frontal lobe gyrus. 3. Remote encephalomalacia with cortical laminar necrosis in the posterior left frontal and parietal lobe. 4. Mild ex vacuo dilation in the posterior left lateral ventricle. 5. Moderately advanced periventricular Wolfman matter changes for age, similar to the prior study. Electronically signed by: Lonni Necessary MD 10/18/2023 11:13 AM EDT RP Workstation: [id number]   MR BRAIN WO CONTRAST Result Date: 09/24/2023 CLINICAL DATA:  Headache, increasing frequency or severity. Neuro deficit, acute, stroke suspected. History of antiphospholipid antibody syndrome and stroke with residual speech and right-sided deficits. EXAM: MRI HEAD WITHOUT CONTRAST TECHNIQUE: Multiplanar, multiecho pulse sequences of the brain and surrounding structures were obtained without intravenous contrast. COMPARISON:  Head CT 09/24/2023 and MRI 09/13/2023 FINDINGS: Brain: A large chronic left MCA infarct is again noted with associated chronic blood products. Heterogeneous diffusion weighted signal intensity within the infarct is unchanged. Associated wallerian degeneration extends into the brainstem, and there is ex vacuo dilatation of the left lateral ventricle. A smaller chronic infarct more anteriorly in the left frontal lobe is also unchanged. Restricted diffusion associated with the punctate acute infarct in the right frontal subcortical Bedoya matter on the recent prior MRI has resolved. No acute infarct, mass,  midline shift, or extra-axial fluid collection is identified. Chronic ischemia/chronic lacunar infarcts are again noted in the subcortical and deep cerebral Crear matter bilaterally. Vascular: Major intracranial vascular flow voids are preserved. Skull and upper cervical spine: Chronically diminished bone marrow T1 signal intensity diffusely, nonspecific though can be seen with anemia, smoking, and obesity. Sinuses/Orbits: Unremarkable orbits. The paranasal sinuses and mastoid air cells are well aerated. Other: None. IMPRESSION: 1. No acute intracranial abnormality. 2. Chronic ischemia as above including a large old left MCA infarct. Electronically Signed   By: Dasie Hamburg M.D.   On: 09/24/2023 10:18   CT Angio Chest PE W and/or Wo Contrast Result Date: 09/24/2023 CLINICAL DATA:  Chest pain. Clinical concern for pulmonary embolism. Documented pulmonary embolism on CT a chest 09/10/2023. EXAM: CT ANGIOGRAPHY CHEST WITH CONTRAST TECHNIQUE: Multidetector CT imaging of the chest was performed using the standard protocol during bolus administration of intravenous contrast. Multiplanar CT image reconstructions and MIPs  were obtained to evaluate the vascular anatomy. RADIATION DOSE REDUCTION: This exam was performed according to the departmental dose-optimization program which includes automated exposure control, adjustment of the mA and/or kV according to patient size and/or use of iterative reconstruction technique. CONTRAST:  75mL OMNIPAQUE  IOHEXOL  350 MG/ML SOLN COMPARISON:  09/10/2023 FINDINGS: Cardiovascular: The heart size is upper normal to borderline enlarged. No substantial pericardial effusion. No large central pulmonary embolus. The previously described subsegmental right lower lobe pulmonary embolus is less conspicuous today (see axial 138/8). No new acute pulmonary embolism on today's study. Mediastinum/Nodes: No mediastinal lymphadenopathy. There is no hilar lymphadenopathy. The esophagus has normal  imaging features. There is no axillary lymphadenopathy. Lungs/Pleura: The lungs are clear without focal pneumonia, edema, pneumothorax or pleural effusion. Scattered areas of mosaic attenuation in the lungs diffusely is nonspecific but may reflect sequelae of air trapping/small airways disease. No dense focal consolidative airspace opacity. No pulmonary edema or pleural effusion. Upper Abdomen: Visualized portion of the upper abdomen shows no acute findings. Musculoskeletal: No worrisome lytic or sclerotic osseous abnormality. Review of the MIP images confirms the above findings. IMPRESSION: 1. The previously described subsegmental right lower lobe pulmonary embolus is less conspicuous today. No new acute pulmonary embolism on today's study. 2. Scattered areas of mosaic attenuation in the lungs diffusely is nonspecific but may reflect sequelae of air trapping/small airways disease. Electronically Signed   By: Camellia Candle M.D.   On: 09/24/2023 09:57   CT Head Wo Contrast Result Date: 09/24/2023 EXAM: CT HEAD WITHOUT CONTRAST 09/24/2023 02:58:11 AM TECHNIQUE: CT of the head was performed without the administration of intravenous contrast. Automated exposure control, iterative reconstruction, and/or weight based adjustment of the mA/kV was utilized to reduce the radiation dose to as low as reasonably achievable. COMPARISON: 09/17/2023 CLINICAL HISTORY: Headache, no red flags; bad headaches, prior stroke x2. FINDINGS: BRAIN AND VENTRICLES: No acute hemorrhage. Gray-Eline differentiation is preserved. No hydrocephalus. No extra-axial collection. No mass effect or midline shift. Old left MCA (middle cerebral artery) territory infarct is unchanged. ORBITS: No acute abnormality. SINUSES: No acute abnormality. SOFT TISSUES AND SKULL: No acute soft tissue abnormality. No skull fracture. IMPRESSION: 1. No acute intracranial abnormality. 2. Unchanged old left MCA territory infarct. Electronically signed by: Franky Stanford MD  09/24/2023 03:06 AM EDT RP Workstation: [id number]       Nivia Colon, PA-C 10/18/23 1938    Nivia Colon, PA-C 10/18/23 2104    Dean Clarity, MD 10/18/23 2105

## 2023-10-18 NOTE — ED Notes (Signed)
 Pt unable to void....KM

## 2023-10-18 NOTE — ED Notes (Signed)
 IV team at bedside

## 2023-10-18 NOTE — ED Notes (Signed)
 IV morphine  held at this time. Pt currently hypotensive with 88 systolic blood pressure. EDP notified.

## 2023-10-18 NOTE — Consult Note (Signed)
 NEUROLOGY CONSULT NOTE   Date of service: October 18, 2023 Patient Name: Jacqueline [name] MRN:  [id number] DOB:  11-19-2000 Chief Complaint: L frontal headache Requesting Provider: Dean Clarity, MD  History of Present Illness  Jacqueline [name] is a 23 y.o. 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  Headache is a 10/10, throbbing, bifrontal with photophobia. Has pain in her nek in the trapezius, more on the left with some spasm. Has pain in her neck  MRI Brain read as punctate R middle frontal stroke but it seems more consistent with a artifact rather than stroke.    ROS  Comprehensive ROS performed and pertinent positives documented in HPI   Past History   Past Medical History:  Diagnosis Date   Bacterial vaginitis 07/10/2016   Candida vaginitis 07/24/2016   Headache in pregnancy, antepartum, third trimester 07/24/2016   Kell isoimmunization during pregnancy    Stroke [facility])     Past Surgical History:  Procedure Laterality Date   CESAREAN SECTION N/A 09/04/2016   Procedure: CESAREAN SECTION;  Surgeon: [name], DO;  Location: [facility];  Service: Obstetrics;  Laterality: N/A;   CESAREAN SECTION Bilateral    IR CT HEAD LTD  02/21/2023   IR GASTROSTOMY TUBE MOD SED  03/21/2023   IR GASTROSTOMY TUBE REMOVAL  05/14/2023   IR PERCUTANEOUS ART THROMBECTOMY/INFUSION INTRACRANIAL INC DIAG ANGIO  02/21/2023   IR REPLC GASTRO/COLONIC TUBE PERCUT W/FLUORO  03/27/2023   RADIOLOGY WITH ANESTHESIA N/A 02/21/2023   Procedure: IR WITH ANESTHESIA;  Surgeon: Dolphus Carrion, MD;  Location: [facility] OR;  Service: Radiology;  Laterality: N/A;    Family History: Family History  Problem Relation Age of Onset   Hypertension Maternal Grandmother    Diabetes Maternal Grandmother    Cancer Neg Hx     Social History  reports that she has quit  smoking. Her smoking use included cigars. She has never used smokeless tobacco. She reports that she does not currently use alcohol. She reports that she does not use drugs.  No Known Allergies  Medications   Current Facility-Administered Medications:    fentaNYL  (SUBLIMAZE ) injection 50 mcg, 50 mcg, Intravenous, Once, Nivia Colon, PA-C   ondansetron  (ZOFRAN ) injection 4 mg, 4 mg, Intravenous, Q6H PRN, Meredith, Savannah F, PA-C  Current Outpatient Medications:    apixaban  (ELIQUIS ) 5 MG TABS tablet, Take 2 tablets (10 mg total) by mouth 2 (two) times daily for 1 day, THEN 1 tablet (5 mg total) 2 (two) times daily., Disp: 184 tablet, Rfl: 0   atorvastatin  (LIPITOR) 20 MG tablet, Take 1 tablet (20 mg total) by mouth daily., Disp: 90 tablet, Rfl: 4   butalbital -acetaminophen -caffeine  (FIORICET ) 50-325-40 MG tablet, Take 1 tablet by mouth every 6 (six) hours as needed for headache., Disp: 20 tablet, Rfl: 0   clonazePAM  (KLONOPIN ) 0.5 MG tablet, Take 1 tablet (0.5 mg total) by mouth at bedtime., Disp: 30 tablet, Rfl: 0   escitalopram  (LEXAPRO ) 10 MG tablet, Take 0.5 tablets (5 mg total) by mouth daily for 7 days, THEN 1 tablet (10 mg total) daily., Disp: 30 tablet, Rfl: 2   gabapentin  (NEURONTIN ) 100 MG capsule, Take 2 capsules (200 mg total) by mouth 3 (three) times daily., Disp: 180 capsule, Rfl: 2   nystatin  cream (MYCOSTATIN ), Apply to affected area 2 times daily, Disp: 30 g, Rfl: 0  Vitals   Vitals:  10-26-2023 1700 10/26/23 1715 October 26, 2023 1800 2023-10-26 1900  BP: (!) 100/48 (!) 91/55 (!) 97/56 95/63  Pulse: 76 71 78 88  Resp: 15 15 15    Temp:      TempSrc:      SpO2: 99% 100% 99% 100%  Weight:      Height:        Body mass index is 32.96 kg/m.   Physical Exam   General: Laying comfortably in bed; in no acute distress.  HENT: Normal oropharynx and mucosa. Normal external appearance of ears and nose.  Neck: Supple, no pain or tenderness  CV: No JVD. No peripheral edema.   Pulmonary: Symmetric Chest rise. Normal respiratory effort.  Abdomen: Soft to touch, non-tender.  Ext: No cyanosis, edema, or deformity  Skin: No rash. Normal palpation of skin.   Musculoskeletal: Normal digits and nails by inspection. No clubbing.   Neurologic Examination  Mental status/Cognition: Alert, oriented to self, place, month and year, good attention.  Speech/language: Fluent, comprehension intact, object naming intact, repetition intact. Mild aphasia. Cranial nerves:   CN II Pupils equal and reactive to light, no VF deficits    CN III,IV,VI EOM intact, no gaze preference or deviation, no nystagmus    CN V normal sensation in V1, V2, and V3 segments bilaterally    CN VII no asymmetry, no nasolabial fold flattening    CN VIII normal hearing to speech    CN IX & X normal palatal elevation, no uvular deviation    CN XI 5/5 head turn and 5/5 shoulder shrug bilaterally    CN XII midline tongue protrusion    Motor:  Muscle bulk: poor on the right, tone slightly increased on the right Mvmt Root Nerve  Muscle Right Left Comments  SA C5/6 Ax Deltoid 2 5   EF C5/6 Mc Biceps 2 5   EE C6/7/8 Rad Triceps 2 5   WF C6/7 Med FCR     WE C7/8 PIN ECU     F Ab C8/T1 U ADM/FDI 0 5   HF L1/2/3 Fem Illopsoas 3    KE L2/3/4 Fem Quad     DF L4/5 D Peron Tib Ant 2 5   PF S1/2 Tibial Grc/Sol 2 5    Sensation:  Light touch Decreased on right   Pin prick    Temperature    Vibration   Proprioception    Coordination/Complex Motor:  - Finger to Nose intact on the left. - Heel to shin unable to do - Rapid alternating movement are intact on the left - Gait: deferred.   Labs/Imaging/Neurodiagnostic studies   CBC:  Recent Labs  Lab 10-26-2023 0001  WBC 4.5  HGB 10.6*  HCT 30.9*  MCV 81.1  PLT 70*   Basic Metabolic Panel:  Lab Results  Component Value Date   NA 134 (L) 2023-10-26   K 3.0 (L) 2023/10/26   CO2 21 (L) Oct 26, 2023   GLUCOSE 104 (H) 10/26/23   BUN 8 10-26-2023    CREATININE 0.81 10-26-23   CALCIUM  8.7 (L) 10/26/2023   GFRNONAA >60 2023-10-26   GFRAA >60 12/06/2016   Lipid Panel:  Lab Results  Component Value Date   LDLCALC 96 09/14/2023   HgbA1c:  Lab Results  Component Value Date   HGBA1C 4.7 (L) 09/14/2023   Urine Drug Screen:     Component Value Date/Time   LABOPIA NONE DETECTED 02/21/2023 1153   COCAINSCRNUR NONE DETECTED 02/21/2023 1153   LABBENZ NONE DETECTED 02/21/2023 1153  AMPHETMU NONE DETECTED 02/21/2023 1153   THCU NONE DETECTED 02/21/2023 1153   LABBARB NONE DETECTED 02/21/2023 1153    Alcohol Level     Component Value Date/Time   ETH <15 09/17/2023 2158   INR  Lab Results  Component Value Date   INR 1.1 09/17/2023   APTT  Lab Results  Component Value Date   APTT 70 (H) 09/17/2023   AED levels: No results found for: PHENYTOIN, ZONISAMIDE, LAMOTRIGINE, LEVETIRACETA  MRI Brain(Personally reviewed): No acute abnormalities  ASSESSMENT   Metha A Northcraft is a 23 y.o. 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  Headache has migrainous features. However, has significant left trapezius spasm with pain in the L trapezius with head movements. Overall, suspect that she has cervical myofascial pain triggering migrainous headache. In addition, do suspect post stroke migraine headache.  RECOMMENDATIONS  - Valproic  acid 500mg  Q8H along with Magensium 2mg  IV Q8H for 3 doses. - Fluids - flexeril  - heating pad along with topical menthol  spray for her neck - can try headache cocktail with benadryl , compazine , fluids if headache is still persistent - continue Eliquis . ______________________________________________________________________    Signed, Julyssa Kyer, MD [facility]

## 2023-10-18 NOTE — ED Notes (Signed)
 ED Provider at bedside.

## 2023-10-18 NOTE — ED Notes (Signed)
 Pt IV removed per patient request. Pt states IV causing her discomfort and she would like it removed. This RN educated patient that she will potentially need an IV for future use, as she is getting admitted to the hospital and another one will likely need to be placed. Pt states this is okay but she does not want the IV that is in her hand.

## 2023-10-18 NOTE — H&P (Signed)
 History and Physical    Jacqueline [name] FMW:[id number] DOB: 2000/08/19 DOA: 10/17/2023  PCP: Pcp, No  Patient coming from: home  I have personally briefly reviewed patient's old medical records in [facility]  Chief Complaint:   HPI: Jacqueline [name] is a 23 y.o. female with medical history significant of migraines,Lupus anticoagulant, CVA,PE on Eliquis  ,with baseline right sided-weakness, thrombocytopenia, Vit B12 def, THC abuse, menorrhagia. Patient also has interim history of admission to 7/8-7/15 with diagnosis of PE insetting of noncompliance with Eliquis . Patient now returns to ED with multiple complaints including menorrhagia stating her period is longer than typical, as well as intractable migraine HA.  Patient notes no fever/ chills/ n/v/sob/ or chest pain .    ED Course:  Afeb bp 112/73, hr 107, rr 18, sat 98%  Na 134 (137), K 3.0, bicarb 21, glu 104, cr 0.81 Wbc 4.5, hgb 10.6, plt 70  Patient noted to have low bp responsive to ivf but with recurrence. Tx Mag ivpb 1g, NS 1L,fentanyl ,flexeril ,decadron , compazine , potassium, benadryl  Review of Systems: As per HPI otherwise 10 point review of systems negative.   Past Medical History:  Diagnosis Date   Bacterial vaginitis 07/10/2016   Candida vaginitis 07/24/2016   Headache in pregnancy, antepartum, third trimester 07/24/2016   Kell isoimmunization during pregnancy    Stroke [facility])     Past Surgical History:  Procedure Laterality Date   CESAREAN SECTION N/A 09/04/2016   Procedure: CESAREAN SECTION;  Surgeon: [name], DO;  Location: [facility];  Service: Obstetrics;  Laterality: N/A;   CESAREAN SECTION Bilateral    IR CT HEAD LTD  02/21/2023   IR GASTROSTOMY TUBE MOD SED  03/21/2023   IR GASTROSTOMY TUBE REMOVAL  05/14/2023   IR PERCUTANEOUS ART THROMBECTOMY/INFUSION INTRACRANIAL INC DIAG ANGIO  02/21/2023   IR REPLC GASTRO/COLONIC TUBE PERCUT W/FLUORO  03/27/2023   RADIOLOGY WITH ANESTHESIA N/A  02/21/2023   Procedure: IR WITH ANESTHESIA;  Surgeon: Dolphus Carrion, MD;  Location: [facility] OR;  Service: Radiology;  Laterality: N/A;     reports that she has quit smoking. Her smoking use included cigars. She has never used smokeless tobacco. She reports that she does not currently use alcohol. She reports that she does not use drugs.  No Known Allergies  Family History  Problem Relation Age of Onset   Hypertension Maternal Grandmother    Diabetes Maternal Grandmother    Cancer Neg Hx     Prior to Admission medications   Medication Sig Start Date End Date Taking? Authorizing Provider  apixaban  (ELIQUIS ) 5 MG TABS tablet Take 2 tablets (10 mg total) by mouth 2 (two) times daily for 1 day, THEN 1 tablet (5 mg total) 2 (two) times daily. 09/16/23 12/16/23  Will Almarie MATSU, MD  atorvastatin  (LIPITOR) 20 MG tablet Take 1 tablet (20 mg total) by mouth daily. 09/16/23   Will Almarie MATSU, MD  butalbital -acetaminophen -caffeine  (FIORICET ) 50-325-40 MG tablet Take 1 tablet by mouth every 6 (six) hours as needed for headache. 09/24/23   Curatolo, Adam, DO  clonazePAM  (KLONOPIN ) 0.5 MG tablet Take 1 tablet (0.5 mg total) by mouth at bedtime. 09/16/23   Will Almarie MATSU, MD  escitalopram  (LEXAPRO ) 10 MG tablet Take 0.5 tablets (5 mg total) by mouth daily for 7 days, THEN 1 tablet (10 mg total) daily. 09/16/23 11/22/23  Will Almarie MATSU, MD  gabapentin  (NEURONTIN ) 100 MG capsule Take 2 capsules (200 mg total) by mouth 3 (three) times daily. 09/16/23  Will Almarie MATSU, MD  nystatin  cream (MYCOSTATIN ) Apply to affected area 2 times daily 10/11/23   Alley, Neils    Physical Exam: Vitals:   10/18/23 1715 10/18/23 1800 10/18/23 1900 10/18/23 2008  BP: (!) 91/55 (!) 97/56 95/63 (!) 91/53  Pulse: 71 78 88 86  Resp: 15 15  18   Temp:    97.7 F (36.5 C)  TempSrc:    Oral  SpO2: 100% 99% 100% 100%  Weight:      Height:        Constitutional: NAD, calm, comfortable Vitals:    10/18/23 1715 10/18/23 1800 10/18/23 1900 10/18/23 2008  BP: (!) 91/55 (!) 97/56 95/63 (!) 91/53  Pulse: 71 78 88 86  Resp: 15 15  18   Temp:    97.7 F (36.5 C)  TempSrc:    Oral  SpO2: 100% 99% 100% 100%  Weight:      Height:       Eyes: PERRL, lids and conjunctivae normal ENMT: Mucous membranes are moist. Posterior pharynx clear of any exudate or lesions.Normal dentition.  Neck: normal, supple, no masses, no thyromegaly Respiratory: clear to auscultation bilaterally, no wheezing, no crackles. Normal respiratory effort. No accessory muscle use.  Cardiovascular: Regular rate and rhythm, no murmurs / rubs / gallops. No extremity edema. 2+ pedal pulses. .  Abdomen: no tenderness, no masses palpated. No hepatosplenomegaly. Bowel sounds positive.  Musculoskeletal: no clubbing / cyanosis. No joint deformity upper and lower extremities. Good ROM, no contractures. Normal muscle tone.  Skin: no rashes, lesions, ulcers. No induration Neurologic: CN grossly intact. Sensation intact,  Strength 5/5 in all 4.  Psychiatric: Normal judgment and insight. Alert and oriented x 3. Normal mood.    Labs on Admission: I have personally reviewed following labs and imaging studies  CBC: Recent Labs  Lab 10/18/23 0001  WBC 4.5  HGB 10.6*  HCT 30.9*  MCV 81.1  PLT 70*   Basic Metabolic Panel: Recent Labs  Lab 10/18/23 0001 10/18/23 1009  NA 134*  --   K 3.0*  --   CL 104  --   CO2 21*  --   GLUCOSE 104*  --   BUN 8  --   CREATININE 0.81  --   CALCIUM  8.7*  --   MG  --  1.8   GFR: Estimated Creatinine Clearance: 113.5 mL/min (by C-G formula based on SCr of 0.81 mg/dL). Liver Function Tests: Recent Labs  Lab 10/18/23 0001  AST 19  ALT 12  ALKPHOS 76  BILITOT 0.4  PROT 7.1  ALBUMIN 3.6   Recent Labs  Lab 10/18/23 0001  LIPASE 28   No results for input(s): AMMONIA in the last 168 hours. Coagulation Profile: No results for input(s): INR, PROTIME in the last 168  hours. Cardiac Enzymes: No results for input(s): CKTOTAL, CKMB, CKMBINDEX, TROPONINI in the last 168 hours. BNP (last 3 results) No results for input(s): PROBNP in the last 8760 hours. HbA1C: No results for input(s): HGBA1C in the last 72 hours. CBG: No results for input(s): GLUCAP in the last 168 hours. Lipid Profile: No results for input(s): CHOL, HDL, LDLCALC, TRIG, CHOLHDL, LDLDIRECT in the last 72 hours. Thyroid Function Tests: No results for input(s): TSH, T4TOTAL, FREET4, T3FREE, THYROIDAB in the last 72 hours. Anemia Panel: No results for input(s): VITAMINB12, FOLATE, FERRITIN, TIBC, IRON , RETICCTPCT in the last 72 hours. Urine analysis:    Component Value Date/Time   COLORURINE RED (A) 09/09/2023 2038  APPEARANCEUR CLOUDY (A) 09/09/2023 2038   LABSPEC 1.011 09/09/2023 2038   PHURINE 6.0 09/09/2023 2038   GLUCOSEU NEGATIVE 09/09/2023 2038   HGBUR MODERATE (A) 09/09/2023 2038   BILIRUBINUR negative 10/11/2023 1807   KETONESUR negative 10/11/2023 1807   KETONESUR NEGATIVE 09/09/2023 2038   PROTEINUR negative 10/11/2023 1807   PROTEINUR 100 (A) 09/09/2023 2038   UROBILINOGEN 0.2 10/11/2023 1807   UROBILINOGEN 1.0 05/08/2021 1944   NITRITE Negative 10/11/2023 1807   NITRITE NEGATIVE 09/09/2023 2038   LEUKOCYTESUR Small (1+) (A) 10/11/2023 1807   LEUKOCYTESUR NEGATIVE 09/09/2023 2038    Radiological Exams on Admission: MR BRAIN WO CONTRAST Result Date: 10/18/2023 EXAM: MRI BRAIN WITHOUT CONTRAST 10/18/2023 11:04:34 AM TECHNIQUE: Multiplanar multisequence MRI of the head/brain was performed without the administration of intravenous contrast. COMPARISON: 09/24/2023 and 09/13/2023 CLINICAL HISTORY: Headache, increasing frequency or severity. FINDINGS: BRAIN AND VENTRICLES: Punctate foci of cortical infarction are noted in the anterior right middle frontal lobe gyrus. Remote encephalomalacia with cortical laminar necrosis is  present in the posterior left frontal and the parietal lobe. Mild ex vacuo dilation is noted in the posterior left lateral ventricle. Periventricular Gouveia matter changes are moderately advanced for age, similar to the prior study. No acute hemorrhage. No mass. No midline shift. ORBITS: No acute abnormality. SINUSES AND MASTOIDS: No acute abnormality. BONES AND SOFT TISSUES: Normal marrow signal. No acute soft tissue abnormality. IMPRESSION: 1. No acute intracranial abnormality related to the clinical history of headache. 2. Punctate foci of cortical infarction in the anterior right middle frontal lobe gyrus. 3. Remote encephalomalacia with cortical laminar necrosis in the posterior left frontal and parietal lobe. 4. Mild ex vacuo dilation in the posterior left lateral ventricle. 5. Moderately advanced periventricular Hitz matter changes for age, similar to the prior study. Electronically signed by: Lonni Necessary MD 10/18/2023 11:13 AM EDT RP Workstation: [id number]    EKG: Independently reviewed.   Assessment/Plan   Intractable Migraine  -in background of recent stroke with abnormal mri -admit to med tele -Valproic  acid 500mg  Q8H along with Magensium 2mg  IV Q8H for 3 doses.  Per neurology recs -continue ivfs -prn migraine cocktail benadryl , compazine ,    Menorrhagia -noted intermittent hypotension -repeat h/h one 1 point dro -type and screen  - transfuse if < 7  -gyn consult to be called in am -per heme/oncology continue with  Eliquis   Hx of recent CVA Hx of recent PE -in background of  Antiphospholipid syndrome  -continue with eliquis    - Dr Onesimo to see patient per on call hematologist  Thrombocytopenia  -plt 70,55  -per on call hematology ok to resume [facility]  -patient states she has been offx 3 days. -patient has hx of noncompliance, she states she is not able to afford medication    DVT prophylaxis: scd Code Status: full/ as discussed per patient wishes in event of  cardiac arrest  Family Communication: none at bedside Disposition Plan: patient  expected to be admitted greater than 2 midnights  Consults called: Hematology Dr Samule Pascal, will need to call GYN in the am  Admission status: med tele   Camila DELENA Ned MD [facility]   If 7PM-7AM, please contact night-coverage [email or web address] Password [facility]  10/18/2023, 9:07 PM

## 2023-10-19 DIAGNOSIS — G43711 Chronic migraine without aura, intractable, with status migrainosus: Secondary | ICD-10-CM | POA: Diagnosis not present

## 2023-10-19 LAB — CBC WITH DIFFERENTIAL/PLATELET
Abs Immature Granulocytes: 0.01 K/uL (ref 0.00–0.07)
Basophils Absolute: 0 K/uL (ref 0.0–0.1)
Basophils Relative: 0 %
Eosinophils Absolute: 0 K/uL (ref 0.0–0.5)
Eosinophils Relative: 0 %
HCT: 27.7 % — ABNORMAL LOW (ref 36.0–46.0)
Hemoglobin: 9.4 g/dL — ABNORMAL LOW (ref 12.0–15.0)
Immature Granulocytes: 0 %
Lymphocytes Relative: 28 %
Lymphs Abs: 1.2 K/uL (ref 0.7–4.0)
MCH: 27.6 pg (ref 26.0–34.0)
MCHC: 33.9 g/dL (ref 30.0–36.0)
MCV: 81.5 fL (ref 80.0–100.0)
Monocytes Absolute: 0.5 K/uL (ref 0.1–1.0)
Monocytes Relative: 11 %
Neutro Abs: 2.6 K/uL (ref 1.7–7.7)
Neutrophils Relative %: 61 %
Platelets: 55 K/uL — ABNORMAL LOW (ref 150–400)
RBC: 3.4 MIL/uL — ABNORMAL LOW (ref 3.87–5.11)
RDW: 12.9 % (ref 11.5–15.5)
WBC: 4.3 K/uL (ref 4.0–10.5)
nRBC: 0 % (ref 0.0–0.2)

## 2023-10-19 LAB — URINALYSIS, ROUTINE W REFLEX MICROSCOPIC
Bilirubin Urine: NEGATIVE
Glucose, UA: NEGATIVE mg/dL
Hgb urine dipstick: NEGATIVE
Ketones, ur: NEGATIVE mg/dL
Nitrite: NEGATIVE
Protein, ur: NEGATIVE mg/dL
Specific Gravity, Urine: 1.008 (ref 1.005–1.030)
pH: 7 (ref 5.0–8.0)

## 2023-10-19 LAB — COMPREHENSIVE METABOLIC PANEL WITH GFR
ALT: 11 U/L (ref 0–44)
AST: 19 U/L (ref 15–41)
Albumin: 2.9 g/dL — ABNORMAL LOW (ref 3.5–5.0)
Alkaline Phosphatase: 63 U/L (ref 38–126)
Anion gap: 8 (ref 5–15)
BUN: 8 mg/dL (ref 6–20)
CO2: 19 mmol/L — ABNORMAL LOW (ref 22–32)
Calcium: 8.4 mg/dL — ABNORMAL LOW (ref 8.9–10.3)
Chloride: 112 mmol/L — ABNORMAL HIGH (ref 98–111)
Creatinine, Ser: 0.82 mg/dL (ref 0.44–1.00)
GFR, Estimated: >60 mL/min (ref >60)
Glucose, Bld: 124 mg/dL — ABNORMAL HIGH (ref 70–99)
Potassium: 3.6 mmol/L (ref 3.5–5.1)
Sodium: 139 mmol/L (ref 135–145)
Total Bilirubin: 0.3 mg/dL (ref 0.0–1.2)
Total Protein: 5.7 g/dL — ABNORMAL LOW (ref 6.5–8.1)

## 2023-10-19 LAB — MAGNESIUM: Magnesium: 2.4 mg/dL (ref 1.7–2.4)

## 2023-10-19 LAB — TROPONIN I (HIGH SENSITIVITY): Troponin I (High Sensitivity): 10 ng/L (ref <18)

## 2023-10-19 LAB — LACTIC ACID, PLASMA: Lactic Acid, Venous: 1.8 mmol/L (ref 0.5–1.9)

## 2023-10-19 LAB — PROCALCITONIN: Procalcitonin: <0.1 ng/mL

## 2023-10-19 MED ORDER — ESCITALOPRAM OXALATE 10 MG PO TABS
10.0000 mg | ORAL_TABLET | Freq: Every day | ORAL | Status: DC
  Administered 2023-10-19 – 2023-10-26 (×8): 10 mg via ORAL
  Filled 2023-10-19 (×8): qty 1

## 2023-10-19 MED ORDER — GLUCAGON HCL RDNA (DIAGNOSTIC) 1 MG IJ SOLR: 1.0000 mg | INTRAMUSCULAR | Status: DC | PRN

## 2023-10-19 MED ORDER — CLONAZEPAM 0.5 MG PO TABS
0.5000 mg | ORAL_TABLET | Freq: Every day | ORAL | Status: DC
Start: 2023-10-19 — End: 2023-10-25
  Administered 2023-10-19 – 2023-10-24 (×6): 0.5 mg via ORAL
  Filled 2023-10-19 (×5): qty 1

## 2023-10-19 MED ORDER — SENNOSIDES-DOCUSATE SODIUM 8.6-50 MG PO TABS: 1.0000 | ORAL_TABLET | Freq: Every evening | ORAL | Status: DC | PRN

## 2023-10-19 MED ORDER — DIPHENHYDRAMINE HCL 50 MG/ML IJ SOLN
50.0000 mg | Freq: Once | INTRAMUSCULAR | Status: AC
  Administered 2023-10-19: 50 mg via INTRAVENOUS
  Filled 2023-10-19: qty 1

## 2023-10-19 MED ORDER — GABAPENTIN 100 MG PO CAPS
200.0000 mg | ORAL_CAPSULE | Freq: Three times a day (TID) | ORAL | Status: DC
  Administered 2023-10-19 – 2023-10-24 (×15): 200 mg via ORAL
  Filled 2023-10-19 (×16): qty 2

## 2023-10-19 MED ORDER — APIXABAN 5 MG PO TABS
5.0000 mg | ORAL_TABLET | Freq: Two times a day (BID) | ORAL | Status: DC
  Administered 2023-10-19 – 2023-10-26 (×15): 5 mg via ORAL
  Filled 2023-10-19 (×16): qty 1

## 2023-10-19 MED ORDER — ORAL CARE MOUTH RINSE: 15.0000 mL | OROMUCOSAL | Status: DC | PRN

## 2023-10-19 MED ORDER — CYCLOBENZAPRINE HCL 10 MG PO TABS
10.0000 mg | ORAL_TABLET | Freq: Three times a day (TID) | ORAL | Status: DC | PRN
  Administered 2023-10-19 – 2023-10-23 (×6): 10 mg via ORAL
  Filled 2023-10-19 (×7): qty 1

## 2023-10-19 MED ORDER — PROCHLORPERAZINE EDISYLATE 10 MG/2ML IJ SOLN
10.0000 mg | Freq: Once | INTRAMUSCULAR | Status: AC
  Administered 2023-10-19: 10 mg via INTRAVENOUS
  Filled 2023-10-19: qty 2

## 2023-10-19 MED ORDER — HYDRALAZINE HCL 20 MG/ML IJ SOLN: 10.0000 mg | INTRAMUSCULAR | Status: DC | PRN

## 2023-10-19 MED ORDER — ATORVASTATIN CALCIUM 10 MG PO TABS
20.0000 mg | ORAL_TABLET | Freq: Every day | ORAL | Status: DC
  Administered 2023-10-19 – 2023-10-26 (×8): 20 mg via ORAL
  Filled 2023-10-19 (×8): qty 2

## 2023-10-19 MED ORDER — IPRATROPIUM-ALBUTEROL 0.5-2.5 (3) MG/3ML IN SOLN: 3.0000 mL | RESPIRATORY_TRACT | Status: DC | PRN

## 2023-10-19 NOTE — Progress Notes (Signed)
 PROGRESS NOTE    Jacqueline [name]  FMW:[id number] DOB: 09/24/2000 DOA: 10/17/2023 PCP: Pcp, No    Brief Narrative:  23 y.o. female with medical history significant of migraines,Lupus anticoagulant, CVA,PE on Eliquis  ,with baseline right sided-weakness, thrombocytopenia, Vit B12 def, THC abuse, menorrhagia. Patient also has interim history of admission to 7/8-7/15 with diagnosis of PE insetting of noncompliance with Eliquis . Patient now returns to ED with multiple complaints including menorrhagia stating her period is longer than typical, as well as intractable migraine HA.    Assessment & Plan:  Principal Problem:   Intractable migraine    Intractable migraine/headache History of left MCA CVA -Due to prior history of CVA she has reportedly has suffered from severe headaches.  MRI performed and seen by neurology team.  For now recommendations are for patient to get valproic  acid for 100 mg every 8 hours along with magnesium  2 g IV every 8 hours for 3 doses. - Continue IV fluids along with headache cocktail including Benadryl /Compazine  -Flexeril      Menorrhagia -This has been chronic for her.  Pelvic ultrasound in February 2025 was unremarkable.  Hemoglobin currently stable at 9.4.  This is overall close to her baseline.   Hx of recent CVA History of recent pulmonary embolism -She has history of antiphospholipid syndrome.  For now continue Eliquis .  Dr. Onesimo from hematology has been consulted   Thrombocytopenia, chronic -No obvious signs of bleeding besides menorrhagia.  Hematology consulted, Dr. Onesimo   Medication noncompliance    DVT prophylaxis: apixaban  (ELIQUIS ) tablet 5 mg      Code Status: Full Code Family Communication:   Status is: Inpatient Remains inpatient appropriate because: Continue hospital stay for ongoing evaluation of thrombocytopenia, intractable headache and pulmonary embolism   PT Follow up Recs:   Subjective: Patient seen at bedside, feels somewhat  drowsy but does not have any new complaints.  Currently she is denying any menstrual bleeding   Examination:  General exam: Appears calm and comfortable  Respiratory system: Clear to auscultation. Respiratory effort normal. Cardiovascular system: S1 & S2 heard, RRR. No JVD, murmurs, rubs, gallops or clicks. No pedal edema. Gastrointestinal system: Abdomen is nondistended, soft and nontender. No organomegaly or masses felt. Normal bowel sounds heard. Central nervous system: Alert and oriented. No focal neurological deficits. Extremities: Symmetric 5 x 5 power. Skin: No rashes, lesions or ulcers Psychiatry: Judgement and insight appear normal. Mood & affect appropriate.                Diet Orders (From admission, onward)     Start     Ordered   10/18/23 2104  Diet Heart Room service appropriate? Yes; Fluid consistency: Thin  Diet effective now       Question Answer Comment  Room service appropriate? Yes   Fluid consistency: Thin      10/18/23 2106            Objective: Vitals:   10/19/23 0645 10/19/23 0711 10/19/23 0753 10/19/23 0812  BP: 92/67  98/78   Pulse:   85   Resp: 17  (!) 26 17  Temp:  97.9 F (36.6 C)  98.3 F (36.8 C)  TempSrc:  Oral  Oral  SpO2:   100% 100%  Weight:      Height:        Intake/Output Summary (Last 24 hours) at 10/19/2023 1029 Last data filed at 10/18/2023 2324 Gross per 24 hour  Intake 1000 ml  Output --  Net 1000 ml  Filed Weights   10/17/23 2339  Weight: 87.1 kg    Scheduled Meds:  apixaban   5 mg Oral BID   atorvastatin   20 mg Oral Daily   clonazePAM   0.5 mg Oral QHS   escitalopram   10 mg Oral Daily   gabapentin   200 mg Oral TID   Continuous Infusions:  sodium chloride  150 mL/hr at 10/19/23 9360   magnesium  sulfate bolus IVPB Stopped (10/19/23 0856)   valproate sodium  Stopped (10/19/23 0042)    Nutritional status     Body mass index is 32.96 kg/m.  Data Reviewed:   CBC: Recent Labs  Lab  10/18/23 0001 10/19/23 0114  WBC 4.5 4.3  NEUTROABS  --  2.6  HGB 10.6* 9.4*  HCT 30.9* 27.7*  MCV 81.1 81.5  PLT 70* 55*   Basic Metabolic Panel: Recent Labs  Lab 10/18/23 0001 10/18/23 1009 10/19/23 0114  NA 134*  --  139  K 3.0*  --  3.6  CL 104  --  112*  CO2 21*  --  19*  GLUCOSE 104*  --  124*  BUN 8  --  8  CREATININE 0.81  --  0.82  CALCIUM  8.7*  --  8.4*  MG  --  1.8 2.4   GFR: Estimated Creatinine Clearance: 112.1 mL/min (by C-G formula based on SCr of 0.82 mg/dL). Liver Function Tests: Recent Labs  Lab 10/18/23 0001 10/19/23 0114  AST 19 19  ALT 12 11  ALKPHOS 76 63  BILITOT 0.4 0.3  PROT 7.1 5.7*  ALBUMIN 3.6 2.9*   Recent Labs  Lab 10/18/23 0001  LIPASE 28   No results for input(s): AMMONIA in the last 168 hours. Coagulation Profile: No results for input(s): INR, PROTIME in the last 168 hours. Cardiac Enzymes: No results for input(s): CKTOTAL, CKMB, CKMBINDEX, TROPONINI in the last 168 hours. BNP (last 3 results) No results for input(s): PROBNP in the last 8760 hours. HbA1C: No results for input(s): HGBA1C in the last 72 hours. CBG: No results for input(s): GLUCAP in the last 168 hours. Lipid Profile: No results for input(s): CHOL, HDL, LDLCALC, TRIG, CHOLHDL, LDLDIRECT in the last 72 hours. Thyroid Function Tests: No results for input(s): TSH, T4TOTAL, FREET4, T3FREE, THYROIDAB in the last 72 hours. Anemia Panel: No results for input(s): VITAMINB12, FOLATE, FERRITIN, TIBC, IRON , RETICCTPCT in the last 72 hours. Sepsis Labs: Recent Labs  Lab 10/19/23 0114  PROCALCITON <0.10  LATICACIDVEN 1.8    No results found for this or any previous visit (from the past 240 hours).       Radiology Studies: MR BRAIN WO CONTRAST Result Date: 10/18/2023 EXAM: MRI BRAIN WITHOUT CONTRAST 10/18/2023 11:04:34 AM TECHNIQUE: Multiplanar multisequence MRI of the head/brain was performed without  the administration of intravenous contrast. COMPARISON: 09/24/2023 and 09/13/2023 CLINICAL HISTORY: Headache, increasing frequency or severity. FINDINGS: BRAIN AND VENTRICLES: Punctate foci of cortical infarction are noted in the anterior right middle frontal lobe gyrus. Remote encephalomalacia with cortical laminar necrosis is present in the posterior left frontal and the parietal lobe. Mild ex vacuo dilation is noted in the posterior left lateral ventricle. Periventricular Hesch matter changes are moderately advanced for age, similar to the prior study. No acute hemorrhage. No mass. No midline shift. ORBITS: No acute abnormality. SINUSES AND MASTOIDS: No acute abnormality. BONES AND SOFT TISSUES: Normal marrow signal. No acute soft tissue abnormality. IMPRESSION: 1. No acute intracranial abnormality related to the clinical history of headache. 2. Punctate foci of cortical infarction in the  anterior right middle frontal lobe gyrus. 3. Remote encephalomalacia with cortical laminar necrosis in the posterior left frontal and parietal lobe. 4. Mild ex vacuo dilation in the posterior left lateral ventricle. 5. Moderately advanced periventricular Ames matter changes for age, similar to the prior study. Electronically signed by: Lonni Necessary MD 10/18/2023 11:13 AM EDT RP Workstation: [id number]           LOS: 1 day   Time spent= 35 mins    Burgess JAYSON Dare, MD [facility]  If 7PM-7AM, please contact night-coverage  10/19/2023, 10:29 AM

## 2023-10-19 NOTE — Hospital Course (Addendum)
 Brief Narrative:  23 y.o. female with medical history significant of migraines,Lupus anticoagulant, CVA,PE on Eliquis  ,with baseline right sided-weakness, thrombocytopenia, Vit B12 def, THC abuse, menorrhagia. Patient also has interim history of admission to 7/8-7/15 with diagnosis of PE insetting of noncompliance with Eliquis . Patient now returns to ED with multiple complaints including menorrhagia stating her period is longer than typical, as well as intractable migraine HA.    Assessment & Plan:  Principal Problem:   Intractable migraine    Intractable migraine/headache History of left MCA CVA -Due to prior history of CVA she has reportedly has suffered from severe headaches.  MRI performed and seen by neurology team.  For now recommendations are for patient to get valproic  acid for 100 mg every 8 hours along with magnesium  2 g IV every 8 hours for 3 doses. - Continue IV fluids along with headache cocktail including Benadryl /Compazine  -Flexeril      Menorrhagia -This has been chronic for her.  Pelvic ultrasound in February 2025 was unremarkable.  Hemoglobin currently stable at 9.4.  This is overall close to her baseline.   Hx of recent CVA History of recent pulmonary embolism -She has history of antiphospholipid syndrome.  For now continue Eliquis .  Dr. Onesimo from hematology has been consulted   Thrombocytopenia, chronic -No obvious signs of bleeding besides menorrhagia.  Hematology consulted, Dr. Onesimo   Medication noncompliance    DVT prophylaxis: apixaban  (ELIQUIS ) tablet 5 mg      Code Status: Full Code Family Communication:   Status is: Inpatient Remains inpatient appropriate because: Continue hospital stay for ongoing evaluation of thrombocytopenia, intractable headache and pulmonary embolism   PT Follow up Recs:   Subjective: Patient seen at bedside, feels somewhat drowsy but does not have any new complaints.  Currently she is denying any menstrual  bleeding   Examination:  General exam: Appears calm and comfortable  Respiratory system: Clear to auscultation. Respiratory effort normal. Cardiovascular system: S1 & S2 heard, RRR. No JVD, murmurs, rubs, gallops or clicks. No pedal edema. Gastrointestinal system: Abdomen is nondistended, soft and nontender. No organomegaly or masses felt. Normal bowel sounds heard. Central nervous system: Alert and oriented. No focal neurological deficits. Extremities: Symmetric 5 x 5 power. Skin: No rashes, lesions or ulcers Psychiatry: Judgement and insight appear normal. Mood & affect appropriate.

## 2023-10-19 NOTE — Plan of Care (Signed)

## 2023-10-19 NOTE — Progress Notes (Signed)
 Mobility Specialist Progress Note:    10/19/23 0912  Mobility  Activity Pivoted/transferred to/from [facility]  Level of Assistance Modified independent, requires aide device or extra time  Assistive Device Other (Comment) (bed rails)  Distance Ambulated (ft) 3 ft  Activity Response Tolerated well  Mobility Referral Yes  Mobility visit 1 Mobility  Mobility Specialist Start Time (ACUTE ONLY) [id number]  Mobility Specialist Stop Time (ACUTE ONLY) 0912  Mobility Specialist Time Calculation (min) (ACUTE ONLY) 6 min   Pt requested assistance to bathroom. ModI using bed rails to stand and pivot to [facility]. Tolerated well, slightly unsteady. RN in room. Left pt on BSC with call bell in reach. All needs met.   Kayte Borchard Mobility Specialist Please contact via Special educational needs teacher or  Rehab office at [phone]

## 2023-10-20 ENCOUNTER — Inpatient Hospital Stay: Admitting: Family Medicine

## 2023-10-20 DIAGNOSIS — G43801 Other migraine, not intractable, with status migrainosus: Secondary | ICD-10-CM

## 2023-10-20 DIAGNOSIS — G43119 Migraine with aura, intractable, without status migrainosus: Secondary | ICD-10-CM

## 2023-10-20 LAB — BASIC METABOLIC PANEL WITH GFR
Anion gap: 7 (ref 5–15)
BUN: 8 mg/dL (ref 6–20)
CO2: 19 mmol/L — ABNORMAL LOW (ref 22–32)
Calcium: 8.1 mg/dL — ABNORMAL LOW (ref 8.9–10.3)
Chloride: 114 mmol/L — ABNORMAL HIGH (ref 98–111)
Creatinine, Ser: 0.97 mg/dL (ref 0.44–1.00)
GFR, Estimated: >60 mL/min (ref >60)
Glucose, Bld: 91 mg/dL (ref 70–99)
Potassium: 3.6 mmol/L (ref 3.5–5.1)
Sodium: 140 mmol/L (ref 135–145)

## 2023-10-20 LAB — CBC
HCT: 25.2 % — ABNORMAL LOW (ref 36.0–46.0)
Hemoglobin: 8.6 g/dL — ABNORMAL LOW (ref 12.0–15.0)
MCH: 27.7 pg (ref 26.0–34.0)
MCHC: 34.1 g/dL (ref 30.0–36.0)
MCV: 81.3 fL (ref 80.0–100.0)
Platelets: 48 K/uL — ABNORMAL LOW (ref 150–400)
RBC: 3.1 MIL/uL — ABNORMAL LOW (ref 3.87–5.11)
RDW: 13.1 % (ref 11.5–15.5)
WBC: 3.8 K/uL — ABNORMAL LOW (ref 4.0–10.5)
nRBC: 0 % (ref 0.0–0.2)

## 2023-10-20 LAB — MAGNESIUM: Magnesium: 2 mg/dL (ref 1.7–2.4)

## 2023-10-20 MED ORDER — DIPHENHYDRAMINE HCL 50 MG/ML IJ SOLN
25.0000 mg | Freq: Once | INTRAMUSCULAR | Status: AC
  Administered 2023-10-20: 25 mg via INTRAVENOUS
  Filled 2023-10-20: qty 1

## 2023-10-20 MED ORDER — PROCHLORPERAZINE EDISYLATE 10 MG/2ML IJ SOLN
10.0000 mg | Freq: Once | INTRAMUSCULAR | Status: AC
  Administered 2023-10-20: 10 mg via INTRAVENOUS
  Filled 2023-10-20: qty 2

## 2023-10-20 MED ORDER — POTASSIUM CHLORIDE CRYS ER 20 MEQ PO TBCR
40.0000 meq | EXTENDED_RELEASE_TABLET | Freq: Once | ORAL | Status: AC
  Administered 2023-10-20: 40 meq via ORAL
  Filled 2023-10-20: qty 2

## 2023-10-20 MED ORDER — METOCLOPRAMIDE HCL 5 MG/ML IJ SOLN
10.0000 mg | Freq: Three times a day (TID) | INTRAMUSCULAR | Status: AC
  Administered 2023-10-20 – 2023-10-21 (×3): 10 mg via INTRAVENOUS
  Filled 2023-10-20 (×3): qty 2

## 2023-10-20 MED ORDER — KETOROLAC TROMETHAMINE 30 MG/ML IJ SOLN
30.0000 mg | Freq: Once | INTRAMUSCULAR | Status: AC
  Administered 2023-10-20: 30 mg via INTRAVENOUS
  Filled 2023-10-20: qty 1

## 2023-10-20 NOTE — Evaluation (Signed)
 Occupational Therapy Evaluation Patient Details Name: Jacqueline [name] MRN: [id number] DOB: 2000-06-03 Today's Date: 10/20/2023   History of Present Illness   Pt is a 23 yr old female who presented intractable migraine HA. PMH graines,Lupus anticoagulant, CVA,PE on Eliquis  ,with baseline right sided-weakness, thrombocytopenia, Vit B12 def, THC abuse, menorrhagia.     Clinical Impressions Pt reported living with sister and they assisted PRN for ADLS/IADLs but at this time was not sure about if she is able to go back to her sister's house. She initially reported use a walker getting around but once back in sitting and asking about hemi walker and showed the pt they reported using this DME to ambulate. At this time needs min assist for ADLS due to residual R side weakness and CGA with ambulation. At this time recommendation for OP OT/PT.      If plan is discharge home, recommend the following:   A little help with walking and/or transfers;A little help with bathing/dressing/bathroom;Assistance with cooking/housework;Assist for transportation     Functional Status Assessment   Patient has had a recent decline in their functional status and demonstrates the ability to make significant improvements in function in a reasonable and predictable amount of time.     Equipment Recommendations   None recommended by OT     Recommendations for Other Services         Precautions/Restrictions   Precautions Precautions: Fall Recall of Precautions/Restrictions: Intact Restrictions Weight Bearing Restrictions Per Provider Order: No     Mobility Bed Mobility Overal bed mobility: Needs Assistance Bed Mobility: Rolling, Supine to Sit, Sit to Supine Rolling: Contact guard assist   Supine to sit: Contact guard Sit to supine: Contact guard assist        Transfers Overall transfer level: Needs assistance Equipment used: Rolling walker (2 wheels) Transfers: Sit to/from Stand Sit  to Stand: Supervision                  Balance Overall balance assessment: Needs assistance Sitting-balance support: Feet supported Sitting balance-Leahy Scale: Good     Standing balance support: Single extremity supported, Bilateral upper extremity supported Standing balance-Leahy Scale: Fair Standing balance comment: Pt was provided hand over hand assist with RUE to use walker as intially reported using and then once showed pt a hemi walker reported that is what they normally use                           ADL either performed or assessed with clinical judgement   ADL Overall ADL's : Needs assistance/impaired Eating/Feeding: Set up;Sitting   Grooming: Wash/dry hands;Minimal assistance;Sitting   Upper Body Bathing: Minimal assistance;Sitting   Lower Body Bathing: Minimal assistance;Sit to/from stand   Upper Body Dressing : Minimal assistance;Sitting   Lower Body Dressing: Minimal assistance;Sit to/from stand   Toilet Transfer: Minimal assistance;Contact guard assist;Ambulation           Functional mobility during ADLs: Contact guard assist;Minimal assistance;Rolling walker (2 wheels)       Vision         Perception         Praxis         Pertinent Vitals/Pain Pain Assessment Pain Assessment: Faces Faces Pain Scale: Hurts a little bit Pain Location: headache Pain Descriptors / Indicators: Aching Pain Intervention(s): Limited activity within patient's tolerance, Monitored during session, Repositioned     Extremity/Trunk Assessment Upper Extremity Assessment Upper Extremity Assessment: RUE deficits/detail RUE Deficits /  Details: Pt has old CVA and noted very limited AROM, pt reported using brace RUE Sensation: decreased light touch RUE Coordination: decreased fine motor;decreased gross motor   Lower Extremity Assessment Lower Extremity Assessment: Defer to PT evaluation       Communication Communication Communication:  Impaired Factors Affecting Communication: Difficulty expressing self   Cognition Arousal: Alert Behavior During Therapy: Flat affect Cognition: No apparent impairments                               Following commands: Intact       Cueing  General Comments   Cueing Techniques: Verbal cues      Exercises     Shoulder Instructions      Home Living Family/patient expects to be discharged to:: Private residence Living Arrangements: Other relatives (sister) Available Help at Discharge: Family;Available 24 hours/day Type of Home: House Home Access: Stairs to enter [employer] of Steps: 2 Entrance Stairs-Rails: None Home Layout: Two level;Able to live on main level with bedroom/bathroom Alternate Level Stairs-Number of Steps: Flight Alternate Level Stairs-Rails: Right Bathroom Shower/Tub: Tub/shower unit;None   Firefighter: Standard Bathroom Accessibility: Yes How Accessible: Accessible via walker Home Equipment: BSC/3in1;Wheelchair - manual;Other (comment) (hemi walker)   Additional Comments: Pt reports they were living with their sister who was assisting with ADLS/IADLS. However, reporting not sure about where they are going to go after this stay.      Prior Functioning/Environment Prior Level of Function : Needs assist  Cognitive Assist : Mobility (cognitive);ADLs (cognitive)     Physical Assist : Mobility (physical);ADLs (physical) Mobility (physical): Transfers;Gait ADLs (physical): Grooming;Bathing;Dressing;IADLs Mobility Comments: When asking how much ambulation they complete daily was not clear but sounded like household distances. ADLs Comments: Pt reported having sometimes assist with ADLS/IADLS    OT Problem List: Decreased strength;Decreased activity tolerance;Impaired balance (sitting and/or standing);Pain   OT Treatment/Interventions:        OT Goals(Current goals can be found in the care plan section)   Acute Rehab OT  Goals Patient Stated Goal: to get better OT Goal Formulation: With patient Time For Goal Achievement: 11/03/23 Potential to Achieve Goals: Good   OT Frequency:  Min 2X/week    Co-evaluation              AM-PAC OT 6 Clicks Daily Activity     Outcome Measure Help from another person eating meals?: A Little Help from another person taking care of personal grooming?: A Little Help from another person toileting, which includes using toliet, bedpan, or urinal?: A Little Help from another person bathing (including washing, rinsing, drying)?: A Little Help from another person to put on and taking off regular upper body clothing?: A Little Help from another person to put on and taking off regular lower body clothing?: A Little 6 Click Score: 18   End of Session Equipment Utilized During Treatment: Gait belt;Rolling walker (2 wheels) Nurse Communication: Mobility status  Activity Tolerance: Patient tolerated treatment well Patient left: in bed;with call bell/phone within reach;with bed alarm set  OT Visit Diagnosis: Unsteadiness on feet (R26.81);Other abnormalities of gait and mobility (R26.89);Muscle weakness (generalized) (M62.81);Pain Pain - Right/Left:  (head)                Time: 8983-8951 OT Time Calculation (min): 32 min Charges:  OT General Charges $OT Visit: 1 Visit OT Evaluation $OT Eval Low Complexity: 1 Low OT Treatments $Self Care/Home Management : 8-22  mins  Warrick POUR OTR/L  Acute Rehab Services  [phone] office number   Warrick Berber 10/20/2023, 11:05 AM

## 2023-10-20 NOTE — Evaluation (Signed)
 Physical Therapy Evaluation Patient Details Name: Jacqueline [name] MRN: [id number] DOB: 2000/08/11 Today's Date: 10/20/2023  History of Present Illness  Pt is a 23 yr old female who presented intractable migraine HA. PMH graines,Lupus anticoagulant, CVA,PE on Eliquis  ,with baseline right sided-weakness, thrombocytopenia, Vit B12 def, THC abuse, menorrhagia.  Clinical Impression  Pt admitted with above diagnosis. Pt was able to ambulate a short distance with CGA. Pt states she was Modif I with hemiwalker PTA.  Pt was able to swing right LE through with gait and definitely needs to use left UE for support.  Pt reports that sister is not going to allow her to come back to her house and she doesn't know where she is going to go when she leaves. Updated CM.  Will follow acutely.  Pt currently with functional limitations due to the deficits listed below (see PT Problem List). Pt will benefit from acute skilled PT to increase their independence and safety with mobility to allow discharge.           If plan is discharge home, recommend the following: A little help with bathing/dressing/bathroom;Assistance with cooking/housework;Assist for transportation;Help with stairs or ramp for entrance   Can travel by private vehicle        Equipment Recommendations None recommended by PT  Recommendations for Other Services       Functional Status Assessment Patient has had a recent decline in their functional status and demonstrates the ability to make significant improvements in function in a reasonable and predictable amount of time.     Precautions / Restrictions Precautions Precautions: Fall Recall of Precautions/Restrictions: Intact Restrictions Weight Bearing Restrictions Per Provider Order: No      Mobility  Bed Mobility Overal bed mobility: Needs Assistance Bed Mobility: Rolling, Supine to Sit, Sit to Supine Rolling: Contact guard assist   Supine to sit: Contact guard Sit to supine:  Contact guard assist   General bed mobility comments: incr time but sat up without assist.    Transfers Overall transfer level: Needs assistance Equipment used: Rolling walker (2 wheels) Transfers: Sit to/from Stand Sit to Stand: Supervision           General transfer comment: Pt was able to stand without assist.    Ambulation/Gait Ambulation/Gait assistance: Contact guard assist, Min assist Gait Distance (Feet): 7 Feet Assistive device: 1 person hand held assist Gait Pattern/deviations: Decreased step length - right, Decreased dorsiflexion - right   Gait velocity interpretation: <1.31 ft/sec, indicative of household ambulator   General Gait Details: Pt sequences steps correctly and is able to clear right foot even though she does have decr dorsiflexion. Pt states she has learned to pick her leg up to ambulate.  States she doesnt have a brace.  Pt was able to walk a short distance with HHA of 1 person with pt bearing weight on left hand but not excessively.  Stairs            Wheelchair Mobility     Tilt Bed    Modified Rankin (Stroke Patients Only)       Balance Overall balance assessment: Needs assistance Sitting-balance support: Feet supported Sitting balance-Leahy Scale: Good     Standing balance support: Single extremity supported, Bilateral upper extremity supported Standing balance-Leahy Scale: Fair Standing balance comment: Pt can stand with HHA of 1 with CGA.  Pertinent Vitals/Pain Pain Assessment Pain Assessment: Faces Faces Pain Scale: Hurts a little bit Pain Location: headache Pain Descriptors / Indicators: Aching Pain Intervention(s): Limited activity within patient's tolerance, Monitored during session, Repositioned    Home Living Family/patient expects to be discharged to:: Private residence Living Arrangements: Other relatives (sister) Available Help at Discharge: Family;Available 24  hours/day Type of Home: House Home Access: Stairs to enter Entrance Stairs-Rails: None Entrance Stairs-Number of Steps: 2 Alternate Level Stairs-Number of Steps: Flight Home Layout: Two level;Able to live on main level with bedroom/bathroom Home Equipment: BSC/3in1;Wheelchair - manual;Other (comment) (hemi walker) Additional Comments: Pt reports they were living with their sister who was assisting with ADLS/IADLS. However, reporting not sure about where they are going to go after this stay.    Prior Function Prior Level of Function : Needs assist  Cognitive Assist : Mobility (cognitive);ADLs (cognitive)     Physical Assist : Mobility (physical);ADLs (physical) Mobility (physical): Transfers;Gait ADLs (physical): Grooming;Bathing;Dressing;IADLs Mobility Comments: When asking how much ambulation they complete daily was not clear but sounded like household distances. ADLs Comments: Pt reported having sometimes assist with ADLS/IADLS     Extremity/Trunk Assessment   Upper Extremity Assessment Upper Extremity Assessment: Defer to OT evaluation RUE Deficits / Details: Pt has old CVA and noted very limited AROM, pt reported using brace RUE Sensation: decreased light touch RUE Coordination: decreased fine motor;decreased gross motor    Lower Extremity Assessment Lower Extremity Assessment: RLE deficits/detail RLE Deficits / Details: ankle 2-5, knee 3/5, hip 3+/5 RLE Sensation: decreased light touch       Communication   Communication Communication: Impaired Factors Affecting Communication: Difficulty expressing self    Cognition Arousal: Alert Behavior During Therapy: Flat affect                             Following commands: Intact       Cueing Cueing Techniques: Verbal cues     General Comments General comments (skin integrity, edema, etc.): BP to 88/64 after walk.  HR 80-100 bpm.    Exercises General Exercises - Lower Extremity Ankle Circles/Pumps:  AROM, Both, 5 reps, Supine Quad Sets: AROM, Both, 5 reps, Supine Heel Slides: AROM, Both, 5 reps, Supine   Assessment/Plan    PT Assessment Patient needs continued PT services  PT Problem List Decreased activity tolerance;Decreased balance;Decreased mobility;Decreased knowledge of use of DME;Decreased safety awareness;Decreased knowledge of precautions       PT Treatment Interventions DME instruction;Gait training;Functional mobility training;Therapeutic activities;Therapeutic exercise;Balance training;Patient/family education;Stair training    PT Goals (Current goals can be found in the Care Plan section)  Acute Rehab PT Goals Patient Stated Goal: to go home PT Goal Formulation: With patient Time For Goal Achievement: 11/03/23 Potential to Achieve Goals: Fair    Frequency Min 2X/week     Co-evaluation               AM-PAC PT 6 Clicks Mobility  Outcome Measure Help needed turning from your back to your side while in a flat bed without using bedrails?: A Little Help needed moving from lying on your back to sitting on the side of a flat bed without using bedrails?: A Little Help needed moving to and from a bed to a chair (including a wheelchair)?: A Little Help needed standing up from a chair using your arms (e.g., wheelchair or bedside chair)?: A Little Help needed to walk in hospital room?: A Little Help needed climbing 3-5  steps with a railing? : A Lot 6 Click Score: 17    End of Session Equipment Utilized During Treatment: Gait belt Activity Tolerance: Patient limited by fatigue Patient left: in bed;with call bell/phone within reach;with bed alarm set Nurse Communication: Mobility status PT Visit Diagnosis: Muscle weakness (generalized) (M62.81)    Time: 9082-9056 PT Time Calculation (min) (ACUTE ONLY): 26 min   Charges:   PT Evaluation $PT Eval Moderate Complexity: 1 Mod PT Treatments $Gait Training: 8-22 mins PT General Charges $$ ACUTE PT VISIT: 1  Visit         Mariell Nester M,PT Acute Rehab Services [phone]   Stephane JULIANNA Bevel 10/20/2023, 2:30 PM

## 2023-10-20 NOTE — Progress Notes (Signed)
 NEUROLOGY CONSULT FOLLOW UP NOTE   Date of service: October 20, 2023 Patient Name: Jacqueline [name] MRN:  [id number] DOB:  Jul 16, 2000  Interval Hx/subjective   No family at bedside.  Patient is sleeping in no apparent distress.  Patient arouses easily.  She does state that she is still having headaches that are constant.  No new neurological events overnight RN states that she did receive a migraine cocktail last night and has been sleeping since with no complaints  Vitals   Vitals:   10/20/23 0010 10/20/23 0014 10/20/23 0357 10/20/23 0724  BP: 96/76  100/65   Pulse: (!) 105 81 93 68  Resp: 20 20 17 15   Temp: 98.5 F (36.9 C)  98.4 F (36.9 C)   TempSrc: Oral  Oral   SpO2:  100% 100%   Weight:      Height:         Body mass index is 32.96 kg/m.  Physical Exam   Constitutional: Appears well-developed and well-nourished.   Psych: Affect appropriate to situation.   Eyes: No scleral injection.    HENT: No OP obstrucion.   Head: Normocephalic.   Cardiovascular: Normal rate and regular rhythm.   Respiratory: Effort normal, non-labored breathing.   GI: Soft.  No distension. There is no tenderness.   Skin: WDI.    Neurologic Examination   Mental Status -  Level of arousal and orientation to time, place, and person were intact. Language including expression, naming, repetition, comprehension was assessed and found intact. Attention span and concentration were normal. Recent and remote memory were intact. Fund of Knowledge was assessed and was intact.  Cranial Nerves II - XII - II - Visual field intact OU. III, IV, VI - Extraocular movements intact. V - Facial sensation intact bilaterally. VII - Facial movement intact bilaterally. VIII - Hearing & vestibular intact bilaterally. X - Palate elevates symmetrically. XI - Chin turning & shoulder shrug intact bilaterally. XII - Tongue protrusion intact.  Motor Strength - The patient's strength was normal in left arm and  left leg, right arm and right leg weakness noted Bulk was normal and fasciculations were absent.   Motor Tone - Muscle tone was assessed at the neck and appendages and was normal Sensory - decreased on right  Coordination - The patient had normal movements in the hands and feet with no ataxia or dysmetria.  Tremor was absent. Gait and Station - deferred.    Medications  Current Facility-Administered Medications:    acetaminophen  (TYLENOL ) tablet 650 mg, 650 mg, Oral, Q6H PRN, 650 mg at 10/20/23 0110 **OR** acetaminophen  (TYLENOL ) suppository 650 mg, 650 mg, Rectal, Q6H PRN, Debby Camila DELENA, MD   apixaban  (ELIQUIS ) tablet 5 mg, 5 mg, Oral, BID, Debby Camila A, MD, 5 mg at 10/19/23 2157   atorvastatin  (LIPITOR) tablet 20 mg, 20 mg, Oral, Daily, Debby Camila A, MD, 20 mg at 10/19/23 9091   clonazePAM  (KLONOPIN ) tablet 0.5 mg, 0.5 mg, Oral, QHS, Debby Camila A, MD, 0.5 mg at 10/19/23 2156   cyclobenzaprine  (FLEXERIL ) tablet 10 mg, 10 mg, Oral, TID PRN, Amin, Ankit C, MD, 10 mg at 10/20/23 9682   escitalopram  (LEXAPRO ) tablet 10 mg, 10 mg, Oral, Daily, Debby Camila A, MD, 10 mg at 10/19/23 9091   gabapentin  (NEURONTIN ) capsule 200 mg, 200 mg, Oral, TID, Debby Camila A, MD, 200 mg at 10/19/23 2156   glucagon  (human recombinant) (GLUCAGEN) injection 1 mg, 1 mg, Intravenous, PRN, Amin, Ankit C, MD   hydrALAZINE  (  APRESOLINE ) injection 10 mg, 10 mg, Intravenous, Q4H PRN, Amin, Ankit C, MD   ipratropium-albuterol  (DUONEB) 0.5-2.5 (3) MG/3ML nebulizer solution 3 mL, 3 mL, Nebulization, Q4H PRN, Amin, Ankit C, MD   metoprolol  tartrate (LOPRESSOR ) injection 5 mg, 5 mg, Intravenous, Q6H PRN, Debby Camila LABOR, MD   Muscle Rub CREA, , Topical, PRN, Khaliqdina, Salman, MD   ondansetron  (ZOFRAN ) injection 4 mg, 4 mg, Intravenous, Q6H PRN, Hoy, Savannah F, PA-C   Oral care mouth rinse, 15 mL, Mouth Rinse, PRN, Amin, Ankit C, MD   potassium chloride  SA (KLOR-CON  M) CR tablet  40 mEq, 40 mEq, Oral, Once, Amin, Ankit C, MD   promethazine  (PHENERGAN ) tablet 12.5 mg, 12.5 mg, Oral, Q6H PRN, Debby Camila A, MD, 12.5 mg at 10/19/23 0345   senna-docusate (Senokot-S) tablet 1 tablet, 1 tablet, Oral, QHS PRN, Caleen Burgess BROCKS, MD  Labs and Diagnostic Imaging   CBC:  Recent Labs  Lab 10/19/23 0114 10/20/23 0504  WBC 4.3 3.8*  NEUTROABS 2.6  --   HGB 9.4* 8.6*  HCT 27.7* 25.2*  MCV 81.5 81.3  PLT 55* 48*    Basic Metabolic Panel:  Lab Results  Component Value Date   NA 140 10/20/2023   K 3.6 10/20/2023   CO2 19 (L) 10/20/2023   GLUCOSE 91 10/20/2023   BUN 8 10/20/2023   CREATININE 0.97 10/20/2023   CALCIUM  8.1 (L) 10/20/2023   GFRNONAA >60 10/20/2023   GFRAA >60 12/06/2016   Lipid Panel:  Lab Results  Component Value Date   LDLCALC 96 09/14/2023   HgbA1c:  Lab Results  Component Value Date   HGBA1C 4.7 (L) 09/14/2023   Urine Drug Screen:     Component Value Date/Time   LABOPIA NONE DETECTED 02/21/2023 1153   COCAINSCRNUR NONE DETECTED 02/21/2023 1153   LABBENZ NONE DETECTED 02/21/2023 1153   AMPHETMU NONE DETECTED 02/21/2023 1153   THCU NONE DETECTED 02/21/2023 1153   LABBARB NONE DETECTED 02/21/2023 1153    Alcohol Level     Component Value Date/Time   [facility] <15 09/17/2023 2158   INR  Lab Results  Component Value Date   INR 1.1 09/17/2023   APTT  Lab Results  Component Value Date   APTT 70 (H) 09/17/2023    MRI Brain(Personally reviewed): No acute process.  2. Punctate foci of cortical infarction in the anterior right middle frontal lobe gyrus. 3. Remote encephalomalacia with cortical laminar necrosis in the posterior left frontal and parietal lobe. 4. Mild ex vacuo dilation in the posterior left lateral ventricle. 5. Moderately advanced periventricular Culliver matter changes for age   Assessment   Jacqueline [name] is a 23 y.o. 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  Headache has migrainous features. However, has significant left trapezius spasm with pain in the L trapezius with head movements. Overall, suspect that she has cervical myofascial pain triggering migrainous headache. In addition, do suspect post stroke migraine headache.   Recommendations  Continue Flexeril , heating pad and muscle rub CREA Continue Eliquis   I will schedule reglan  to try to continue helping headache ______________________________________________________________________   Signed, Karna LABOR Geralds, NP [facility]  I have seen and evaluated the patient. I have reviewed the above note and made appropriate changes.   She has status migrainosus.  She has a history of similar headaches, but this one is just persistent, though she did get  benefit from a cocktail yesterday.  I would continue scheduled Reglan  overnight tonight, to try and continue to tamp down the migraine.  Neurology will follow.  Aisha Seals, MD [facility]   If 7pm- 7am, please page neurology on call as listed in [facility]ION.

## 2023-10-20 NOTE — TOC Initial Note (Signed)
 Transition of Care (TOC) - Initial/Assessment Note  Rayfield Gobble RN, BSN Inpatient Care Management Unit 4E- RN Case Manager See Treatment Team for direct phone #   Patient Details  Name: Jacqueline [name] MRN: [id number] Date of Birth: [birth year]  Transition of Care [facility]) CM/SW Contact:    Gobble Rayfield Hurst, RN Phone Number: 10/20/2023, 3:41 PM  Clinical Narrative:                 Msg received from PT that pt informed therapist that her sister that she was staying with has told her she can not return to the home.   CM in to speak with pt at bedside- pt voiced that she has been staying with one of her sisters however the sister has told her she can not return- pt voiced that she has no where else to go at this time.   Note per chart review that pt had [place] Medicaid [facility] plan on last admit- asked pt about her insurance- she reports that she has taken care of this to get it re-instated earlier this month.  Pt called another one of her sisters- Future Nordmeyer- and had CM speak with her over phone- Per Future pt does not have anywhere to go- no other family members are willing to take her in, parents are not willing and she has no other friends or family members to assist. Future reports that she does not have her own place either and is staying with a friend- she is trying to work on housing for her and her sister.  Future is asking if there is any way that pt can go to rehab for a few weeks - CM explained that pt can not be placed in rehab due to housing situation and on last admit recs where for outpt therapy f/u. Explained to Future that without an acute need for rehab- placement was not an option. Future request that CM follow up after PT/OT evals completed.  Future confirmed pt has hemi walker, wheelchair and BSC at the sister's home- Future will try to bring patient's hemi-walker to the hospital.   Per pt she has not applied for disability ([employer]SS[employer]) and does not receive any income.  CM  discussed with pt that she and her family would need to work on a plan for where she will go on discharge vs having to go to a shelter.   Contact for Future Hunn- sister-  [phone]- attempted to call Future to discuss discharge plan and recommendations for outpt therapy (pt was referred to outpt therapy in July). VM left.   Expected Discharge Plan: OP Rehab Barriers to Discharge: Continued Medical Work up   Patient Goals and CMS Choice Patient states their goals for this hospitalization and ongoing recovery are:: wants a safe place to go          Expected Discharge Plan and Services   Discharge Planning Services: CM Consult Post Acute Care Choice: NA Living arrangements for the past 2 months: Single Family Home                 DME Arranged: N/A DME Agency: NA       HH Arranged: NA HH Agency: NA        Prior Living Arrangements/Services Living arrangements for the past 2 months: Single Family Home Lives with:: Siblings Patient language and need for interpreter reviewed:: Yes Do you feel safe going back to the place where you live?:  (sister is voiceding  that pt can not return to her home)      Need for Family Participation in Patient Care: Yes (Comment) Care giver support system in place?: Yes (comment) Current home services: DME (hemi-walker, BSC, wheelchair, tub bench) Criminal Activity/Legal Involvement Pertinent to Current Situation/Hospitalization: No - Comment as needed  Activities of Daily Living      Permission Sought/Granted                  Emotional Assessment Appearance:: Appears stated age Attitude/Demeanor/Rapport: Engaged Affect (typically observed): Calm Orientation: : Oriented to Self, Oriented to Place, Oriented to  Time, Oriented to Situation   Psych Involvement: No (comment)  Admission diagnosis:  Hypokalemia [E87.6] Intractable migraine [G43.919] Abnormal MRI [R93.89] Other migraine with status migrainosus, intractable  [G43.811] Patient Active Problem List   Diagnosis Date Noted   Intractable migraine 10/18/2023   Pulmonary emboli (HCC) 09/10/2023   Class 1 obesity 09/10/2023   Elevated troponin 09/10/2023   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) 06/04/2023   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   Antiphospholipid antibody syndrome (HCC) 05/12/2023   Symptomatic anemia 05/07/2023   History of stroke 05/06/2023   Bacteremia 03/27/2023   SIRS (systemic inflammatory response syndrome) (HCC) 03/26/2023   Hyponatremia 03/26/2023   Ileus (HCC) 03/26/2023   Pain around PEG tube site, initial encounter 03/26/2023   Transaminitis 03/26/2023   History of pulmonary embolism 03/26/2023   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   Agitation 03/04/2023   Urinary tract infection without hematuria 03/04/2023   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/04/2023   Dysphagia 03/04/2023   Vitamin B12 deficiency 03/04/2023   Acute ischemic left MCA stroke (HCC) 02/21/2023   Middle cerebral artery embolism, left 02/21/2023   Community acquired pneumonia of right lower lobe of lung 04/10/2022   Iron  deficiency anemia 04/09/2022   Folic acid  deficiency 04/09/2022   Acute pulmonary embolism (HCC) 04/08/2022   Normocytic anemia 04/08/2022   Thrombocytopenia (HCC) 04/08/2022   Hypokalemia 04/08/2022   Moderate protein malnutrition (HCC) 04/08/2022   Nexplanon  insertion 08/05/2017   PPH (postpartum hemorrhage) 08/04/2017   VBAC (vaginal birth after Cesarean) 08/04/2017   Abnormal fetal position 07/09/2017   Limited prenatal care in third trimester 07/09/2017   Anemia in pregnancy 06/02/2017   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   Thrombocytopenia affecting pregnancy, antepartum (HCC) 03/06/2017   Supervision of other normal pregnancy, antepartum 02/28/2017   Biological false positive RPR test 02/28/2017   H/O cesarean section 02/28/2017   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 [place] [address] Phone: [phone] Fax: [phone]  [id number] - [place], [place] - [address] AT [facility] [address] [place] [address] Phone: [phone] Fax: [phone]     Social Drivers of Health (SDOH) Social History: SDOH Screenings   Food Insecurity: No Food Insecurity (10/19/2023)  Housing: Low Risk  (10/19/2023)  Transportation Needs: Unmet Transportation Needs (10/19/2023)  Utilities: Not At Risk (10/19/2023)  Financial Resource Strain: Not on File (06/21/2021)   Received from [facility]  Physical Activity: Not on File (06/21/2021)   Received from [facility]  Social Connections: Not on File (11/16/2022)  Received from [facility]  Stress: Not on File (06/21/2021)   Received from [facility]  Tobacco Use: Medium Risk (10/17/2023)   SDOH Interventions:     Readmission Risk Interventions    09/15/2023   12:34 PM 05/09/2023    1:23 PM  Readmission Risk Prevention Plan  Transportation Screening Complete Complete  PCP or Specialist Appt within 3-5 Days Complete Complete  HRI or Home Care Consult Complete Complete  Social Work Consult for Recovery Care Planning/Counseling Complete Complete  Palliative Care Screening Not Applicable Not Applicable  Medication Review Oceanographer) Complete Complete

## 2023-10-20 NOTE — Progress Notes (Signed)
 PROGRESS NOTE    Jacqueline [name]  FMW:[id number] DOB: [birth year] DOA: 10/17/2023 PCP: Pcp, No    Brief Narrative:  23 y.o. female with medical history significant of migraines,Lupus anticoagulant, CVA,PE on Eliquis  ,with baseline right sided-weakness, thrombocytopenia, Vit B12 def, THC abuse, menorrhagia. Patient also has interim history of admission to 7/8-7/15 with diagnosis of PE insetting of noncompliance with Eliquis . Patient now returns to ED with multiple complaints including menorrhagia stating her period is longer than typical, as well as intractable migraine HA.  During hospitalization declined any kind of bleeding, remained stable on Eliquis .  She also worked with physical therapy and neurology in terms of her headache and weakness.   Assessment & Plan:  Principal Problem:   Intractable migraine    Intractable migraine/headache History of left MCA CVA -Due to prior history of CVA she has reportedly has suffered from severe headaches.  MRI performed and seen by neurology team.  For now recommendations are for patient to get valproic  acid for 100 mg every 8 hours along with magnesium  2 g IV every 8 hours for 3 doses. - Continue IV fluids along with headache cocktail including Benadryl /Compazine  -Flexeril      Menorrhagia -This has been chronic for her.  Pelvic ultrasound in February 2025 was unremarkable.  Hemoglobin currently stable at 9.4.  This is overall close to her baseline.   Hx of recent CVA History of recent pulmonary embolism -She has history of antiphospholipid syndrome.  For now continue Eliquis .  Discussed with Dr. Onesimo.  No longer following with our service due to multiple no-shows.   Thrombocytopenia, chronic -No obvious signs of bleeding besides menorrhagia.  Discussed with Dr. Onesimo.  No longer following with our service due to multiple no-shows. Need to follow up with outptn GYN   Medication noncompliance She understands that if she remains noncompliant  her medical condition will worsen and become life-threatening leading to death.    DVT prophylaxis: apixaban  (ELIQUIS ) tablet 5 mg      Code Status: Full Code Family Communication:   Status is: Inpatient Remains inpatient appropriate because: Plans to dc tomorrow. Working with therapy today   PT Follow up Recs:   Subjective: Weak with physical therapy this morning. Unfortunately poor follow-up outpatient  Examination:  General exam: Appears calm and comfortable  Respiratory system: Clear to auscultation. Respiratory effort normal. Cardiovascular system: S1 & S2 heard, RRR. No JVD, murmurs, rubs, gallops or clicks. No pedal edema. Gastrointestinal system: Abdomen is nondistended, soft and nontender. No organomegaly or masses felt. Normal bowel sounds heard. Central nervous system: Alert and oriented. No focal neurological deficits. Extremities: Symmetric 5 x 5 power. Skin: No rashes, lesions or ulcers Psychiatry: Judgement and insight appear normal. Mood & affect appropriate.                Diet Orders (From admission, onward)     Start     Ordered   10/18/23 2104  Diet Heart Room service appropriate? Yes; Fluid consistency: Thin  Diet effective now       Question Answer Comment  Room service appropriate? Yes   Fluid consistency: Thin      10/18/23 2106            Objective: Vitals:   10/20/23 0010 10/20/23 0014 10/20/23 0357 10/20/23 0724  BP: 96/76  100/65 (!) 92/59  Pulse: (!) 105 81 93 68  Resp: 20 20 17 15   Temp: 98.5 F (36.9 C)  98.4 F (36.9 C) 98.4 F (36.9  C)  TempSrc: Oral  Oral Oral  SpO2:  100% 100% 99%  Weight:      Height:        Intake/Output Summary (Last 24 hours) at 10/20/2023 1142 Last data filed at 10/20/2023 0300 Gross per 24 hour  Intake 1244.57 ml  Output 900 ml  Net 344.57 ml   Filed Weights   10/17/23 2339  Weight: 87.1 kg    Scheduled Meds:  apixaban   5 mg Oral BID   atorvastatin   20 mg Oral Daily    clonazePAM   0.5 mg Oral QHS   escitalopram   10 mg Oral Daily   gabapentin   200 mg Oral TID   Continuous Infusions:  Nutritional status     Body mass index is 32.96 kg/m.  Data Reviewed:   CBC: Recent Labs  Lab 10/18/23 0001 10/19/23 0114 10/20/23 0504  WBC 4.5 4.3 3.8*  NEUTROABS  --  2.6  --   HGB 10.6* 9.4* 8.6*  HCT 30.9* 27.7* 25.2*  MCV 81.1 81.5 81.3  PLT 70* 55* 48*   Basic Metabolic Panel: Recent Labs  Lab 10/18/23 0001 10/18/23 1009 10/19/23 0114 10/20/23 0504  NA 134*  --  139 140  K 3.0*  --  3.6 3.6  CL 104  --  112* 114*  CO2 21*  --  19* 19*  GLUCOSE 104*  --  124* 91  BUN 8  --  8 8  CREATININE 0.81  --  0.82 0.97  CALCIUM  8.7*  --  8.4* 8.1*  MG  --  1.8 2.4 2.0   GFR: Estimated Creatinine Clearance: 94.8 mL/min (by C-G formula based on SCr of 0.97 mg/dL). Liver Function Tests: Recent Labs  Lab 10/18/23 0001 10/19/23 0114  AST 19 19  ALT 12 11  ALKPHOS 76 63  BILITOT 0.4 0.3  PROT 7.1 5.7*  ALBUMIN 3.6 2.9*   Recent Labs  Lab 10/18/23 0001  LIPASE 28   No results for input(s): AMMONIA in the last 168 hours. Coagulation Profile: No results for input(s): INR, PROTIME in the last 168 hours. Cardiac Enzymes: No results for input(s): CKTOTAL, CKMB, CKMBINDEX, TROPONINI in the last 168 hours. BNP (last 3 results) No results for input(s): PROBNP in the last 8760 hours. HbA1C: No results for input(s): HGBA1C in the last 72 hours. CBG: No results for input(s): GLUCAP in the last 168 hours. Lipid Profile: No results for input(s): CHOL, HDL, LDLCALC, TRIG, CHOLHDL, LDLDIRECT in the last 72 hours. Thyroid Function Tests: No results for input(s): TSH, T4TOTAL, FREET4, T3FREE, THYROIDAB in the last 72 hours. Anemia Panel: No results for input(s): VITAMINB12, FOLATE, FERRITIN, TIBC, IRON , RETICCTPCT in the last 72 hours. Sepsis Labs: Recent Labs  Lab 10/19/23 0114  PROCALCITON  <0.10  LATICACIDVEN 1.8    No results found for this or any previous visit (from the past 240 hours).       Radiology Studies: No results found.         LOS: 2 days   Time spent= 35 mins    Jacqueline JAYSON Dare, MD [facility]  If 7PM-7AM, please contact night-coverage  10/20/2023, 11:42 AM

## 2023-10-20 NOTE — Plan of Care (Signed)

## 2023-10-21 DIAGNOSIS — G43811 Other migraine, intractable, with status migrainosus: Secondary | ICD-10-CM

## 2023-10-21 LAB — BASIC METABOLIC PANEL WITH GFR
Anion gap: 7 (ref 5–15)
BUN: 11 mg/dL (ref 6–20)
CO2: 19 mmol/L — ABNORMAL LOW (ref 22–32)
Calcium: 8.7 mg/dL — ABNORMAL LOW (ref 8.9–10.3)
Chloride: 108 mmol/L (ref 98–111)
Creatinine, Ser: 0.88 mg/dL (ref 0.44–1.00)
GFR, Estimated: >60 mL/min (ref >60)
Glucose, Bld: 161 mg/dL — ABNORMAL HIGH (ref 70–99)
Potassium: 4 mmol/L (ref 3.5–5.1)
Sodium: 134 mmol/L — ABNORMAL LOW (ref 135–145)

## 2023-10-21 LAB — CBC
HCT: 28.6 % — ABNORMAL LOW (ref 36.0–46.0)
Hemoglobin: 10 g/dL — ABNORMAL LOW (ref 12.0–15.0)
MCH: 27.8 pg (ref 26.0–34.0)
MCHC: 35 g/dL (ref 30.0–36.0)
MCV: 79.4 fL — ABNORMAL LOW (ref 80.0–100.0)
Platelets: 66 K/uL — ABNORMAL LOW (ref 150–400)
RBC: 3.6 MIL/uL — ABNORMAL LOW (ref 3.87–5.11)
RDW: 13 % (ref 11.5–15.5)
WBC: 7.4 K/uL (ref 4.0–10.5)
nRBC: 0 % (ref 0.0–0.2)

## 2023-10-21 LAB — MAGNESIUM: Magnesium: 1.7 mg/dL (ref 1.7–2.4)

## 2023-10-21 MED ORDER — PROCHLORPERAZINE EDISYLATE 10 MG/2ML IJ SOLN
10.0000 mg | Freq: Once | INTRAMUSCULAR | Status: AC
  Administered 2023-10-21: 10 mg via INTRAVENOUS
  Filled 2023-10-21: qty 2

## 2023-10-21 MED ORDER — DEXAMETHASONE SODIUM PHOSPHATE 4 MG/ML IJ SOLN
4.0000 mg | Freq: Once | INTRAMUSCULAR | Status: AC
  Administered 2023-10-21: 4 mg via INTRAVENOUS
  Filled 2023-10-21: qty 1

## 2023-10-21 MED ORDER — ACETAMINOPHEN 500 MG PO TABS
1000.0000 mg | ORAL_TABLET | Freq: Four times a day (QID) | ORAL | Status: DC | PRN
  Administered 2023-10-21 – 2023-10-25 (×7): 1000 mg via ORAL
  Filled 2023-10-21 (×9): qty 2

## 2023-10-21 MED ORDER — DIPHENHYDRAMINE HCL 50 MG/ML IJ SOLN
12.5000 mg | Freq: Once | INTRAMUSCULAR | Status: AC
  Administered 2023-10-21: 12.5 mg via INTRAVENOUS
  Filled 2023-10-21: qty 1

## 2023-10-21 MED ORDER — AMITRIPTYLINE HCL 25 MG PO TABS
25.0000 mg | ORAL_TABLET | Freq: Every day | ORAL | Status: DC
  Administered 2023-10-21 – 2023-10-24 (×4): 25 mg via ORAL
  Filled 2023-10-21 (×4): qty 1

## 2023-10-21 NOTE — Progress Notes (Signed)
 PROGRESS NOTE    Jacqueline [name]  FMW:[id number] DOB: 02/15/2001 DOA: 10/17/2023 PCP: Pcp, No    Brief Narrative:  23 y.o. female with medical history significant of migraines,Lupus anticoagulant, CVA,PE on Eliquis  ,with baseline right sided-weakness, thrombocytopenia, Vit B12 def, THC abuse, menorrhagia. Patient also has interim history of admission to 7/8-7/15 with diagnosis of PE insetting of noncompliance with Eliquis . Patient now returns to ED with multiple complaints including menorrhagia stating her period is longer than typical, as well as intractable migraine HA.  During hospitalization declined any kind of bleeding, remained stable on Eliquis .  She also worked with physical therapy and neurology in terms of her headache and weakness.  Neurology tried multiple medications.  Hemoglobin is stable.  Discussed with hematology, no further workup recommended due to her noncompliance and no-show issues.  For now [facility] working on safe disposition.  Will discharge her today once cleared by neurology   Assessment & Plan:  Principal Problem:   Intractable migraine    Intractable migraine/headache History of left MCA CVA -Due to prior history of CVA she has reportedly has suffered from severe headaches.  MRI performed and seen by neurology team.  Trial of multiple medications including scheduled Reglan  per neurology yesterday.  Amitriptyline  has been started.  Hopefully we can DC her once cleared by their service     Menorrhagia -This has been chronic for her.  Pelvic ultrasound in February 2025 was unremarkable.  Hemoglobin currently stable.  She is denying any vaginal bleeding at this time.   Hx of recent CVA History of recent pulmonary embolism -She has history of antiphospholipid syndrome.  For now continue Eliquis .  Discussed with Dr. Onesimo.  No longer following with their service due to multiple no-shows.   Thrombocytopenia, chronic -No obvious signs of bleeding besides menorrhagia.   Discussed with Dr. Onesimo.  No longer following with their service due to multiple no-shows. Need to follow up with outptn GYN    Medication noncompliance She understands that if she remains noncompliant her medical condition will worsen and become life-threatening leading to death. I have extensively advised her about importance of compliance with her medical follow-up and her medications.    DVT prophylaxis: apixaban  (ELIQUIS ) tablet 5 mg     Code Status: Full Code Family Communication:   Status is: Inpatient Remains inpatient appropriate because: DC once cleared by neurology today PT Follow up Recs:   Subjective: No complaints  Examination:  General exam: Appears calm and comfortable  Respiratory system: Clear to auscultation. Respiratory effort normal. Cardiovascular system: S1 & S2 heard, RRR. No JVD, murmurs, rubs, gallops or clicks. No pedal edema. Gastrointestinal system: Abdomen is nondistended, soft and nontender. No organomegaly or masses felt. Normal bowel sounds heard. Central nervous system: Alert and oriented. No focal neurological deficits. Extremities: Symmetric 5 x 5 power. Skin: No rashes, lesions or ulcers Psychiatry: Judgement and insight appear normal. Mood & affect appropriate.                Diet Orders (From admission, onward)     Start     Ordered   10/18/23 2104  Diet Heart Room service appropriate? Yes; Fluid consistency: Thin  Diet effective now       Question Answer Comment  Room service appropriate? Yes   Fluid consistency: Thin      10/18/23 2106            Objective: Vitals:   10/20/23 2050 10/20/23 2346 10/21/23 0257 10/21/23 0726  BP: ROLLEN)  85/50 (!) 95/55 (!) 99/54 (!) 96/57  Pulse: 89 97 82 77  Resp: 18 18 18 19   Temp: 98.3 F (36.8 C) 98.2 F (36.8 C) 98.4 F (36.9 C) 98.3 F (36.8 C)  TempSrc: Oral Oral Oral Axillary  SpO2: 98% 99% 99% 98%  Weight:      Height:        Intake/Output Summary (Last 24 hours) at  10/21/2023 1149 Last data filed at 10/20/2023 2000 Gross per 24 hour  Intake 240 ml  Output --  Net 240 ml   Filed Weights   10/17/23 2339  Weight: 87.1 kg    Scheduled Meds:  amitriptyline   25 mg Oral QHS   apixaban   5 mg Oral BID   atorvastatin   20 mg Oral Daily   clonazePAM   0.5 mg Oral QHS   escitalopram   10 mg Oral Daily   gabapentin   200 mg Oral TID   Continuous Infusions:  Nutritional status     Body mass index is 32.96 kg/m.  Data Reviewed:   CBC: Recent Labs  Lab 10/18/23 0001 10/19/23 0114 10/20/23 0504 10/21/23 1008  WBC 4.5 4.3 3.8* 7.4  NEUTROABS  --  2.6  --   --   HGB 10.6* 9.4* 8.6* 10.0*  HCT 30.9* 27.7* 25.2* 28.6*  MCV 81.1 81.5 81.3 79.4*  PLT 70* 55* 48* 66*   Basic Metabolic Panel: Recent Labs  Lab 10/18/23 0001 10/18/23 1009 10/19/23 0114 10/20/23 0504 10/21/23 1008  NA 134*  --  139 140 134*  K 3.0*  --  3.6 3.6 4.0  CL 104  --  112* 114* 108  CO2 21*  --  19* 19* 19*  GLUCOSE 104*  --  124* 91 161*  BUN 8  --  8 8 11   CREATININE 0.81  --  0.82 0.97 0.88  CALCIUM  8.7*  --  8.4* 8.1* 8.7*  MG  --  1.8 2.4 2.0 1.7   GFR: Estimated Creatinine Clearance: 104.4 mL/min (by C-G formula based on SCr of 0.88 mg/dL). Liver Function Tests: Recent Labs  Lab 10/18/23 0001 10/19/23 0114  AST 19 19  ALT 12 11  ALKPHOS 76 63  BILITOT 0.4 0.3  PROT 7.1 5.7*  ALBUMIN 3.6 2.9*   Recent Labs  Lab 10/18/23 0001  LIPASE 28   No results for input(s): AMMONIA in the last 168 hours. Coagulation Profile: No results for input(s): INR, PROTIME in the last 168 hours. Cardiac Enzymes: No results for input(s): CKTOTAL, CKMB, CKMBINDEX, TROPONINI in the last 168 hours. BNP (last 3 results) No results for input(s): PROBNP in the last 8760 hours. HbA1C: No results for input(s): HGBA1C in the last 72 hours. CBG: No results for input(s): GLUCAP in the last 168 hours. Lipid Profile: No results for input(s): CHOL,  HDL, LDLCALC, TRIG, CHOLHDL, LDLDIRECT in the last 72 hours. Thyroid Function Tests: No results for input(s): TSH, T4TOTAL, FREET4, T3FREE, THYROIDAB in the last 72 hours. Anemia Panel: No results for input(s): VITAMINB12, FOLATE, FERRITIN, TIBC, IRON , RETICCTPCT in the last 72 hours. Sepsis Labs: Recent Labs  Lab 10/19/23 0114  PROCALCITON <0.10  LATICACIDVEN 1.8    No results found for this or any previous visit (from the past 240 hours).       Radiology Studies: No results found.         LOS: 3 days   Time spent= 35 mins    Soliana Kitko JAYSON Dare, MD [facility]  If 7PM-7AM, please contact night-coverage  10/21/2023, 11:49 AM

## 2023-10-21 NOTE — Progress Notes (Signed)
 PT Cancellation Note  Patient Details Name: Jacqueline [name] MRN: [id number] DOB: 2000/11/05   Cancelled Treatment:    Reason Eval/Treat Not Completed: (P) Patient's level of consciousness (pt sleeping deeply, not awoken to voice or opening of blinds in room.) Will continue efforts per PT plan of care as schedule permits.   Connell HERO Lacey Dotson 10/21/2023, 3:40 PM

## 2023-10-21 NOTE — Progress Notes (Signed)
 NEUROLOGY CONSULT FOLLOW UP NOTE   Date of service: October 21, 2023 Patient Name: Jacqueline [name] MRN:  [id number] DOB:  January 13, 2001  Interval Hx/subjective   No family at bedside.  Patient is sleeping in no apparent distress.  Patient states she continues to have a headache, at first she states she can not rate her headache than states its 8/10.  Will start elavil  25 mg HS  Vitals   Vitals:   10/20/23 2050 10/20/23 2346 10/21/23 0257 10/21/23 0726  BP: (!) 85/50 (!) 95/55 (!) 99/54 (!) 96/57  Pulse: 89 97 82 77  Resp: 18 18 18 19   Temp: 98.3 F (36.8 C) 98.2 F (36.8 C) 98.4 F (36.9 C) 98.3 F (36.8 C)  TempSrc: Oral Oral Oral Axillary  SpO2: 98% 99% 99% 98%  Weight:      Height:         Body mass index is 32.96 kg/m.  Physical Exam   Constitutional: Appears well-developed and well-nourished.   Psych: Affect appropriate to situation.   Eyes: No scleral injection.    HENT: No OP obstrucion.   Head: Normocephalic.   Cardiovascular: Normal rate and regular rhythm.   Respiratory: Effort normal, non-labored breathing.   GI: Soft.  No distension. There is no tenderness.   Skin: WDI.    Neurologic Examination   Mental Status -  Level of arousal and orientation to time, place, and person were intact. Language including expression, naming, repetition, comprehension was assessed and found intact. Attention span and concentration were normal. Recent and remote memory were intact. Fund of Knowledge was assessed and was intact.  Cranial Nerves II - XII - II - Visual field intact OU. III, IV, VI - Extraocular movements intact. V - Facial sensation intact bilaterally. VII - Facial movement intact bilaterally. VIII - Hearing & vestibular intact bilaterally. X - Palate elevates symmetrically. XI - Chin turning & shoulder shrug intact bilaterally. XII - Tongue protrusion intact.  Motor Strength - The patient's strength was normal in left arm and left leg, right arm and  right leg weakness noted Bulk was normal and fasciculations were absent.   Motor Tone - Muscle tone was assessed at the neck and appendages and was normal Sensory - decreased on right  Coordination - The patient had normal movements in the hands and feet with no ataxia or dysmetria.  Tremor was absent. Gait and Station - deferred.    Medications  Current Facility-Administered Medications:    acetaminophen  (TYLENOL ) tablet 1,000 mg, 1,000 mg, Oral, Q6H PRN, Segars, Dorn, MD   apixaban  (ELIQUIS ) tablet 5 mg, 5 mg, Oral, BID, Debby Hitch A, MD, 5 mg at 10/21/23 0820   atorvastatin  (LIPITOR) tablet 20 mg, 20 mg, Oral, Daily, Debby Hitch A, MD, 20 mg at 10/21/23 0820   clonazePAM  (KLONOPIN ) tablet 0.5 mg, 0.5 mg, Oral, QHS, Debby Hitch A, MD, 0.5 mg at 10/20/23 2135   cyclobenzaprine  (FLEXERIL ) tablet 10 mg, 10 mg, Oral, TID PRN, Amin, Ankit C, MD, 10 mg at 10/20/23 2135   escitalopram  (LEXAPRO ) tablet 10 mg, 10 mg, Oral, Daily, Debby Hitch A, MD, 10 mg at 10/21/23 0820   gabapentin  (NEURONTIN ) capsule 200 mg, 200 mg, Oral, TID, Debby Hitch A, MD, 200 mg at 10/21/23 0820   glucagon  (human recombinant) (GLUCAGEN) injection 1 mg, 1 mg, Intravenous, PRN, Amin, Ankit C, MD   hydrALAZINE  (APRESOLINE ) injection 10 mg, 10 mg, Intravenous, Q4H PRN, Amin, Ankit C, MD   ipratropium-albuterol  (DUONEB) 0.5-2.5 (3) MG/3ML  nebulizer solution 3 mL, 3 mL, Nebulization, Q4H PRN, Amin, Ankit C, MD   metoprolol  tartrate (LOPRESSOR ) injection 5 mg, 5 mg, Intravenous, Q6H PRN, Debby Camila LABOR, MD   Muscle Rub CREA, , Topical, PRN, Khaliqdina, Salman, MD   ondansetron  (ZOFRAN ) injection 4 mg, 4 mg, Intravenous, Q6H PRN, Hoy Nidia FALCON, PA-C   Oral care mouth rinse, 15 mL, Mouth Rinse, PRN, Amin, Ankit C, MD   promethazine  (PHENERGAN ) tablet 12.5 mg, 12.5 mg, Oral, Q6H PRN, Debby Camila A, MD, 12.5 mg at 10/19/23 0345   senna-docusate (Senokot-S) tablet 1 tablet, 1  tablet, Oral, QHS PRN, Caleen Burgess BROCKS, MD  Labs and Diagnostic Imaging   CBC:  Recent Labs  Lab 10/19/23 0114 10/20/23 0504  WBC 4.3 3.8*  NEUTROABS 2.6  --   HGB 9.4* 8.6*  HCT 27.7* 25.2*  MCV 81.5 81.3  PLT 55* 48*    Basic Metabolic Panel:  Lab Results  Component Value Date   NA 140 10/20/2023   K 3.6 10/20/2023   CO2 19 (L) 10/20/2023   GLUCOSE 91 10/20/2023   BUN 8 10/20/2023   CREATININE 0.97 10/20/2023   CALCIUM  8.1 (L) 10/20/2023   GFRNONAA >60 10/20/2023   GFRAA >60 12/06/2016   Lipid Panel:  Lab Results  Component Value Date   LDLCALC 96 09/14/2023   HgbA1c:  Lab Results  Component Value Date   HGBA1C 4.7 (L) 09/14/2023   Urine Drug Screen:     Component Value Date/Time   LABOPIA NONE DETECTED 02/21/2023 1153   COCAINSCRNUR NONE DETECTED 02/21/2023 1153   LABBENZ NONE DETECTED 02/21/2023 1153   AMPHETMU NONE DETECTED 02/21/2023 1153   THCU NONE DETECTED 02/21/2023 1153   LABBARB NONE DETECTED 02/21/2023 1153    Alcohol Level     Component Value Date/Time   [facility] <15 09/17/2023 2158   INR  Lab Results  Component Value Date   INR 1.1 09/17/2023   APTT  Lab Results  Component Value Date   APTT 70 (H) 09/17/2023    MRI Brain(Personally reviewed): No acute process.  2. Punctate foci of cortical infarction in the anterior right middle frontal lobe gyrus. 3. Remote encephalomalacia with cortical laminar necrosis in the posterior left frontal and parietal lobe. 4. Mild ex vacuo dilation in the posterior left lateral ventricle. 5. Moderately advanced periventricular Jackson matter changes for age   Assessment   Jacqueline [name] is a 23 y.o. 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  Headache has migrainous features. However, has significant left trapezius spasm with pain in the L trapezius with head movements.  Overall, suspect that she has cervical myofascial pain triggering migrainous headache. In addition, do suspect post stroke migraine headache.   Recommendations  - Start elavil  25 mg HS  - Neurology outpatient follow after discharge in 2-4 weeks ____________________   Signed, Karna LABOR Geralds, NP [facility]

## 2023-10-21 NOTE — Progress Notes (Signed)
 Occupational Therapy Treatment Patient Details Name: DEYA BIGOS MRN: [id number] DOB: 02-21-2001 Today's Date: 10/21/2023   History of present illness Pt is a 23 yr old female who presented intractable migraine HA. PMH graines,Lupus anticoagulant, CVA,PE on Eliquis  ,with baseline right sided-weakness, thrombocytopenia, Vit B12 def, THC abuse, menorrhagia.   OT comments  Pt presented in bed. At this time she completed room level ambulation with hemiwalker in LUE and ambulated with supervision to CGA. She then completed sponge bath post set up in standing with CGA and cued on positioning of RUE. Pt also educated about giving RUE rest from brace and hygiene as noted some redness on hand. At this time recommendation for OP therapy and pt did not voice any changes in status with able to go to her sister's home.       If plan is discharge home, recommend the following:  A little help with walking and/or transfers;A little help with bathing/dressing/bathroom;Assistance with cooking/housework;Assist for transportation   Equipment Recommendations   (depedning where she move to might need DME but at this time none)    Recommendations for Other Services      Precautions / Restrictions Precautions Precautions: Fall Recall of Precautions/Restrictions: Intact Restrictions Weight Bearing Restrictions Per Provider Order: No       Mobility Bed Mobility Overal bed mobility: Needs Assistance Bed Mobility: Rolling, Supine to Sit, Sit to Supine Rolling: Modified independent (Device/Increase time)   Supine to sit: Modified independent (Device/Increase time) Sit to supine: Modified independent (Device/Increase time)   General bed mobility comments: increase in time with [facility] elevated    Transfers Overall transfer level: Needs assistance Equipment used: Rolling walker (2 wheels) Transfers: Sit to/from Stand Sit to Stand: Supervision           General transfer comment: no assist with  transfer     Balance Overall balance assessment: Needs assistance Sitting-balance support: Feet supported Sitting balance-Leahy Scale: Good     Standing balance support: Single extremity supported Standing balance-Leahy Scale: Fair Standing balance comment: Pt used hemiwalker                           ADL either performed or assessed with clinical judgement   ADL Overall ADL's : Needs assistance/impaired Eating/Feeding: Set up;Sitting   Grooming: Wash/dry face;Wash/dry hands;Minimal assistance Grooming Details (indicate cue type and reason): min assist with R hand Upper Body Bathing: Set up;Sitting   Lower Body Bathing: Contact guard assist Lower Body Bathing Details (indicate cue type and reason): used chair to prop RLE to don sock Upper Body Dressing : CGA;Sitting   Lower Body Dressing: Minimal assistance;Sit to/from Careers adviser Details (indicate cue type and reason): hemiwalker Toileting- Clothing Manipulation and Hygiene: Contact guard assist       Functional mobility during ADLs: Contact guard assist General ADL Comments: hemiwalker    Extremity/Trunk Assessment Upper Extremity Assessment Upper Extremity Assessment: RUE deficits/detail RUE Deficits / Details: Pt has old CVA and noted very limited AROM, pt reported using brace RUE Sensation: decreased light touch RUE Coordination: decreased fine motor;decreased gross motor   Lower Extremity Assessment Lower Extremity Assessment: Defer to PT evaluation        Vision       Perception     Praxis     Communication Communication Communication: Impaired Factors Affecting Communication: Difficulty expressing self   Cognition Arousal: Alert Behavior During Therapy: Flat affect Cognition: No  apparent impairments             OT - Cognition Comments: pt spoke to this rep                 Following commands: Intact        Cueing    Cueing Techniques: Verbal cues  Exercises      Shoulder Instructions       General Comments      Pertinent Vitals/ Pain       Pain Assessment Pain Assessment: Faces Faces Pain Scale: Hurts little more Breathing: normal Negative Vocalization: none Facial Expression: smiling or inexpressive Body Language: relaxed Consolability: no need to console PAINAD Score: 0 Pain Location: headache Pain Descriptors / Indicators: Aching Pain Intervention(s): Monitored during session, Limited activity within patient's tolerance  Home Living                                          Prior Functioning/Environment              Frequency  Min 2X/week        Progress Toward Goals  OT Goals(current goals can now be found in the care plan section)  Progress towards OT goals: Progressing toward goals  Acute Rehab OT Goals Patient Stated Goal: to get better OT Goal Formulation: With patient Time For Goal Achievement: 11/03/23 Potential to Achieve Goals: Good ADL Goals Pt Will Perform Upper Body Bathing: with modified independence;sitting Pt Will Perform Lower Body Bathing: with modified independence;sit to/from stand Pt Will Perform Upper Body Dressing: with modified independence;sitting Pt Will Perform Lower Body Dressing: with modified independence;sit to/from stand Pt Will Transfer to Toilet: with modified independence  Plan      Co-evaluation                 AM-PAC OT 6 Clicks Daily Activity     Outcome Measure   Help from another person eating meals?: A Little Help from another person taking care of personal grooming?: A Little Help from another person toileting, which includes using toliet, bedpan, or urinal?: A Little Help from another person bathing (including washing, rinsing, drying)?: A Little Help from another person to put on and taking off regular upper body clothing?: A Little Help from another person to put on and taking off regular  lower body clothing?: A Little 6 Click Score: 18    End of Session Equipment Utilized During Treatment: Gait belt (hemiwalker)  OT Visit Diagnosis: Unsteadiness on feet (R26.81);Other abnormalities of gait and mobility (R26.89);Muscle weakness (generalized) (M62.81);Pain Pain - Right/Left:  (head)   Activity Tolerance Patient tolerated treatment well   Patient Left in bed;with call bell/phone within reach;with bed alarm set   Nurse Communication Mobility status        Time: 1030-1100 OT Time Calculation (min): 30 min  Charges: OT General Charges $OT Visit: 1 Visit OT Treatments $Self Care/Home Management : 23-37 mins  Warrick POUR OTR/L  Acute Rehab Services  [phone] office number   Warrick Berber 10/21/2023, 11:38 AM

## 2023-10-22 ENCOUNTER — Inpatient Hospital Stay (HOSPITAL_COMMUNITY): Payer: Self-pay

## 2023-10-22 DIAGNOSIS — G43711 Chronic migraine without aura, intractable, with status migrainosus: Secondary | ICD-10-CM

## 2023-10-22 LAB — BASIC METABOLIC PANEL WITH GFR
Anion gap: 9 (ref 5–15)
BUN: 9 mg/dL (ref 6–20)
CO2: 18 mmol/L — ABNORMAL LOW (ref 22–32)
Calcium: 8.8 mg/dL — ABNORMAL LOW (ref 8.9–10.3)
Chloride: 109 mmol/L (ref 98–111)
Creatinine, Ser: 0.84 mg/dL (ref 0.44–1.00)
GFR, Estimated: >60 mL/min (ref >60)
Glucose, Bld: 144 mg/dL — ABNORMAL HIGH (ref 70–99)
Potassium: 3.3 mmol/L — ABNORMAL LOW (ref 3.5–5.1)
Sodium: 136 mmol/L (ref 135–145)

## 2023-10-22 LAB — CBC
HCT: 28 % — ABNORMAL LOW (ref 36.0–46.0)
Hemoglobin: 9.9 g/dL — ABNORMAL LOW (ref 12.0–15.0)
MCH: 27.8 pg (ref 26.0–34.0)
MCHC: 35.4 g/dL (ref 30.0–36.0)
MCV: 78.7 fL — ABNORMAL LOW (ref 80.0–100.0)
Platelets: 60 K/uL — ABNORMAL LOW (ref 150–400)
RBC: 3.56 MIL/uL — ABNORMAL LOW (ref 3.87–5.11)
RDW: 12.7 % (ref 11.5–15.5)
WBC: 8.2 K/uL (ref 4.0–10.5)
nRBC: 0 % (ref 0.0–0.2)

## 2023-10-22 LAB — MAGNESIUM: Magnesium: 1.6 mg/dL — ABNORMAL LOW (ref 1.7–2.4)

## 2023-10-22 MED ORDER — PROCHLORPERAZINE EDISYLATE 10 MG/2ML IJ SOLN
10.0000 mg | Freq: Once | INTRAMUSCULAR | Status: AC
  Administered 2023-10-22: 10 mg via INTRAVENOUS
  Filled 2023-10-22: qty 2

## 2023-10-22 MED ORDER — KETOROLAC TROMETHAMINE 30 MG/ML IJ SOLN
30.0000 mg | Freq: Once | INTRAMUSCULAR | Status: AC
  Administered 2023-10-22: 30 mg via INTRAVENOUS
  Filled 2023-10-22: qty 1

## 2023-10-22 MED ORDER — POTASSIUM CHLORIDE CRYS ER 20 MEQ PO TBCR
40.0000 meq | EXTENDED_RELEASE_TABLET | Freq: Once | ORAL | Status: AC
  Administered 2023-10-22: 40 meq via ORAL
  Filled 2023-10-22: qty 2

## 2023-10-22 MED ORDER — MAGNESIUM SULFATE 2 GM/50ML IV SOLN
2.0000 g | Freq: Once | INTRAVENOUS | Status: AC
  Administered 2023-10-22: 2 g via INTRAVENOUS
  Filled 2023-10-22: qty 50

## 2023-10-22 MED ORDER — TRAMADOL HCL 50 MG PO TABS
50.0000 mg | ORAL_TABLET | Freq: Once | ORAL | Status: AC
  Administered 2023-10-22: 50 mg via ORAL
  Filled 2023-10-22: qty 1

## 2023-10-22 MED ORDER — DIPHENHYDRAMINE HCL 50 MG/ML IJ SOLN
12.5000 mg | Freq: Once | INTRAMUSCULAR | Status: AC
  Administered 2023-10-22: 12.5 mg via INTRAVENOUS
  Filled 2023-10-22: qty 1

## 2023-10-22 MED ORDER — ALPRAZOLAM 0.25 MG PO TABS
0.5000 mg | ORAL_TABLET | Freq: Two times a day (BID) | ORAL | Status: DC | PRN
  Administered 2023-10-22 – 2023-10-26 (×6): 0.5 mg via ORAL
  Filled 2023-10-22 (×2): qty 1
  Filled 2023-10-22 (×3): qty 2
  Filled 2023-10-22 (×2): qty 1

## 2023-10-22 MED ORDER — KETOROLAC TROMETHAMINE 15 MG/ML IJ SOLN
15.0000 mg | Freq: Three times a day (TID) | INTRAMUSCULAR | Status: DC | PRN
  Administered 2023-10-22 – 2023-10-23 (×2): 15 mg via INTRAVENOUS
  Filled 2023-10-22 (×2): qty 1

## 2023-10-22 NOTE — Progress Notes (Signed)
 Progress Note   Patient: Jacqueline [name] FMW:[id number] DOB: 02-08-2001 DOA: 10/17/2023     4 DOS: the patient was seen and examined on 10/22/2023    Brief Narrative:  From HPI 23 y.o. female with medical history significant of migraines,Lupus anticoagulant, CVA,PE on Eliquis  ,with baseline right sided-weakness, thrombocytopenia, Vit B12 def, THC abuse, menorrhagia. Patient also has interim history of admission to 7/8-7/15 with diagnosis of PE insetting of noncompliance with Eliquis . Patient now returns to ED with multiple complaints including menorrhagia stating her period is longer than typical, as well as intractable migraine HA.  During hospitalization declined any kind of bleeding, remained stable on Eliquis .  She also worked with physical therapy and neurology in terms of her headache and weakness.  Neurology tried multiple medications.  Hemoglobin is stable.  Discussed with hematology, no further workup recommended due to her noncompliance and no-show issues.         Assessment & Plan:  Principal Problem:   Intractable migraine     Intractable migraine/headache History of left MCA CVA -Due to prior history of CVA she has reportedly has suffered from severe headaches.  MRI performed and seen by neurology team.  Trial of multiple medications including scheduled Reglan  per neurology Continue amitriptyline  as recommended by neurology  I added a dose of Toradol  today on account of persistent migraine with some improvement   Hypokalemia-continue repletion and monitoring  Hypomagnesemia Patient received a dose of magnesium  today   Menorrhagia -This has been chronic for her.  Pelvic ultrasound in February 2025 was unremarkable.  She admits to having ongoing bleeding now in her menses which has lasted for almost 1 month I have requested pelvic ultrasound today   Hx of recent CVA History of recent pulmonary embolism -She has history of antiphospholipid syndrome.  For now continue  Eliquis .  Discussed with Dr. Onesimo.  No longer following with their service due to multiple no-shows.   Thrombocytopenia, chronic -No obvious signs of bleeding besides menorrhagia.  Need to follow up with outptn GYN     Medication noncompliance Patient counseled on compliance     DVT prophylaxis: apixaban  (ELIQUIS ) tablet 5 mg     Code Status: Full Code Family Communication:   Status is: Inpatient Remains inpatient appropriate because: Persistent migraine   Subjective: Patient complains of migraine headache with intensity of 10/10 today Denies nausea vomiting or any aura   Examination:   General exam: Appears calm and comfortable  Respiratory system: Clear to auscultation. Respiratory effort normal. Cardiovascular system: S1 & S2 heard, RRR. No JVD, murmurs, rubs, gallops or clicks. No pedal edema. Gastrointestinal system: Abdomen is nondistended, soft and nontender. No organomegaly or masses felt. Normal bowel sounds heard. Central nervous system: Alert and oriented. No focal neurological deficits. Extremities: Symmetric 5 x 5 power. Skin: No rashes, lesions or ulcers Psychiatry: Judgement and insight appear normal. Mood & affect appropriate.     Vitals:   10/22/23 1158 10/22/23 1530 10/22/23 1602 10/22/23 1615  BP: 100/63 (!) 86/54 (!) 88/72 98/66  Pulse: 89 86  90  Resp: 20 20    Temp: 98.3 F (36.8 C) 98.5 F (36.9 C)    TempSrc: Oral Oral    SpO2: 100% 98%    Weight:      Height:        Data Reviewed:     Latest Ref Rng & Units 10/22/2023    3:36 AM 10/21/2023   10:08 AM 10/20/2023    5:04 AM  CBC  WBC 4.0 - 10.5 K/uL 8.2  7.4  3.8   Hemoglobin 12.0 - 15.0 g/dL 9.9  89.9  8.6   Hematocrit 36.0 - 46.0 % 28.0  28.6  25.2   Platelets 150 - 400 K/uL 60  66  48        Latest Ref Rng & Units 10/22/2023    3:36 AM 10/21/2023   10:08 AM 10/20/2023    5:04 AM  BMP  Glucose 70 - 99 mg/dL 855  838  91   BUN 6 - 20 mg/dL 9  11  8    Creatinine 0.44 - 1.00 mg/dL  9.15  9.11  9.02   Sodium 135 - 145 mmol/L 136  134  140   Potassium 3.5 - 5.1 mmol/L 3.3  4.0  3.6   Chloride 98 - 111 mmol/L 109  108  114   CO2 22 - 32 mmol/L 18  19  19    Calcium  8.9 - 10.3 mg/dL 8.8  8.7  8.1       Author: Drue ONEIDA Potter, MD 10/22/2023 5:30 PM  For on call review [email or web address].

## 2023-10-22 NOTE — Progress Notes (Signed)
 Entered pt's room at approx 1645 to give scheduled gabapentin .  Pt in a deep sleep, no distress noted, visible chest rise.  Gabapentin  held at this time

## 2023-10-22 NOTE — Plan of Care (Signed)

## 2023-10-22 NOTE — Progress Notes (Addendum)
 Physical Therapy Treatment Patient Details Name: Jacqueline [name] MRN: [id number] DOB: Jun 30, 2000 Today's Date: 10/22/2023   History of Present Illness Pt is a 23 yr old female who presented intractable migraine HA. PMH migraines, Lupus anticoagulant, CVA with baseline right sided-weakness, PE, Antiphospholipid Syndrome on Eliquis , thrombocytopenia, Vit B12 def, THC abuse, menorrhagia.    PT Comments  Pt received in supine, obtunded and minimally responsive, once HOB elevated slightly, pt responding to some verbal cues, pt only agreeable to minimal bed level activity, BP assessment and repositioning. Discussion with patient on risks of immobility and benefits of mobility, pt not appearing receptive to instruction and participates with max encouragement to a few BLE exercises and provided with PROM on RUE. Pt BP assessed due to low reading prior to session and appears improved with UE/LE ROM, but pt defers to keep bed in chair posture at end of session. RN notified of pt poor progress toward goals, BP and lethargy. Pt may benefit from psych consult due to low mood, vs chaplain consult given difficult apparent social situation. Pt may need resources and support for housing instability per chart review. Pt continues to benefit from PT services to progress toward functional mobility goals, pt may need PT reassessment next session if still somnolent/needing increased assist, but likely related to sedating meds per chart review. Patient Position (if appropriate) Orthostatic Vitals  Orthostatic Lying   BP- Lying (!) 88/72 (79)  Pulse- Lying  (telemetry not attached)  Orthostatic Sitting  BP- Sitting- Bed chair posture 94/57 (69) (HOB 70 deg; pt refusing EOB/OOB)   Pulse Rate 90  Pulse Rate Source Brachial  BP- Bed chair posture after ~5 mins elevated HOB 98/66 (76) (bed in chair posture; pt refusing edge of bed)     If plan is discharge home, recommend the following: A little help with  bathing/dressing/bathroom;Assistance with cooking/housework;Assist for transportation;Help with stairs or ramp for entrance   Can travel by private vehicle        Equipment Recommendations  None recommended by PT    Recommendations for Other Services       Precautions / Restrictions Precautions Precautions: Fall Recall of Precautions/Restrictions: Impaired Precaution/Restrictions Comments: decreased awareness of risks of immobility; possible passive SI per RN Restrictions Weight Bearing Restrictions Per Provider Order: No     Mobility  Bed Mobility Overal bed mobility: Needs Assistance Bed Mobility: Supine to Sit     Supine to sit: Min assist, HOB elevated, Used rails     General bed mobility comments: Increased assist likely due to lethargy/pt limited effort; pt using L bed rail to partial long sitting while pillow adjusted behind her, but refusing to attempt to sit EOB despite encouragement.    Transfers Overall transfer level: Needs assistance                 General transfer comment: pt refusing    Ambulation/Gait                   Stairs             Wheelchair Mobility     Tilt Bed    Modified Rankin (Stroke Patients Only)       Balance Overall balance assessment: Needs assistance     Sitting balance - Comments: pt refusing EOB; limited effort when encouraged to attempt long sitting in bed.       Standing balance comment: pt refusing  Communication Communication Communication: Impaired Factors Affecting Communication: Difficulty expressing self (drowsy, minimally verbal, answers some questions with max encouragement)  Cognition Arousal: Obtunded, Suspect due to medications Behavior During Therapy: Flat affect   PT - Cognitive impairments: No family/caregiver present to determine baseline, Attention, Initiation                       PT - Cognition Comments: Limited  receptiveness to session; pt refusing EOB/OOB or transfers. Pt given education on risks of immobility/benefits of mobility but not receptive. Pt participates with a few supine/chair posture bed exercises but then defers additional HEP or transfer training. RN notified of pt limited participation/lethargy and per RN, pt had depressed affect this date. Slow response to most questions. Following commands: Impaired Following commands impaired: Follows one step commands inconsistently    Cueing Cueing Techniques: Verbal cues, Tactile cues  Exercises General Exercises - Upper Extremity Shoulder Flexion: PROM, Right, 10 reps (high fowlers position in bed) Elbow Flexion: PROM, Right, 10 reps (high fowler's position; unclear if pt unable to assist or poor effort-PTA unfamiliar with pt, noted residual R hemiplegia; pt reports she does not have a sling for comfort) Elbow Extension: PROM, Right, 10 reps General Exercises - Lower Extremity Ankle Circles/Pumps: AROM, AAROM, Both, 10 reps, Supine Long Arc Quad: AAROM, Both, 10 reps, AROM, Other (comment) (bed in chair posture; some AA on each limb due to pt lethargy/limited effort overall) Hip Flexion/Marching: AAROM, Both, 5 reps, Other (comment) (bed in chair posture; poor effort)    General Comments General comments (skin integrity, edema, etc.): see BP in comments above; RN notified pt BP MAP improved with activity but pt self-limiting due to c/o pain/fatigue.      Pertinent Vitals/Pain Pain Assessment Pain Assessment: Faces Faces Pain Scale: Hurts even more Breathing: normal Negative Vocalization: none Facial Expression: facial grimacing Body Language: relaxed Consolability: no need to console PAINAD Score: 2 Pain Location: headache (frontal per patient), stabbing; pt grimacing when pillow repositioned and pt with less RUE support, so new pillow placed once obtained and pt reports more comfortable Pain Descriptors / Indicators: Aching, Stabbing,  Guarding Pain Intervention(s): Limited activity within patient's tolerance, Monitored during session, Repositioned, Premedicated before session    Home Living                          Prior Function            PT Goals (current goals can now be found in the care plan section) Acute Rehab PT Goals Patient Stated Goal: None stated PT Goal Formulation: With patient Time For Goal Achievement: 11/03/23 Progress towards PT goals: Not progressing toward goals - comment;PT to reassess next treatment    Frequency    Min 2X/week      PT Plan      Co-evaluation              AM-PAC PT 6 Clicks Mobility   Outcome Measure  Help needed turning from your back to your side while in a flat bed without using bedrails?: A Little Help needed moving from lying on your back to sitting on the side of a flat bed without using bedrails?: A Little Help needed moving to and from a bed to a chair (including a wheelchair)?: A Lot Help needed standing up from a chair using your arms (e.g., wheelchair or bedside chair)?: A Lot Help needed to walk in hospital room?: Total (due  to lethargy) Help needed climbing 3-5 steps with a railing? : Total 6 Click Score: 12    End of Session   Activity Tolerance: Patient limited by lethargy;Patient limited by pain Patient left: in bed;with call bell/phone within reach;with bed alarm set;Other (comment) (bed in chair posture, extra pillow under RUE to promote neutral shoulder posture/reduce subluxation and reduce discomfort) Nurse Communication: Mobility status;Other (comment) (pt refusing EOB/OOB mobility and lethargy; BP improved with bed-level activity) PT Visit Diagnosis: Muscle weakness (generalized) (M62.81)     Time: 8397-8384 PT Time Calculation (min) (ACUTE ONLY): 13 min  Charges:    $Therapeutic Activity: 8-22 mins PT General Charges $$ ACUTE PT VISIT: 1 Visit                     Rowan Pollman P., PTA Acute Rehabilitation  Services Secure Chat Preferred 9a-5:30pm Office: [phone]    Connell Jacqueline [name] 10/22/2023, 4:38 PM

## 2023-10-23 DIAGNOSIS — G43011 Migraine without aura, intractable, with status migrainosus: Secondary | ICD-10-CM

## 2023-10-23 LAB — CBC
HCT: 27.6 % — ABNORMAL LOW (ref 36.0–46.0)
Hemoglobin: 9.5 g/dL — ABNORMAL LOW (ref 12.0–15.0)
MCH: 27.3 pg (ref 26.0–34.0)
MCHC: 34.4 g/dL (ref 30.0–36.0)
MCV: 79.3 fL — ABNORMAL LOW (ref 80.0–100.0)
Platelets: 58 K/uL — ABNORMAL LOW (ref 150–400)
RBC: 3.48 MIL/uL — ABNORMAL LOW (ref 3.87–5.11)
RDW: 13.1 % (ref 11.5–15.5)
WBC: 5.4 K/uL (ref 4.0–10.5)
nRBC: 0 % (ref 0.0–0.2)

## 2023-10-23 LAB — BASIC METABOLIC PANEL WITH GFR
Anion gap: 10 (ref 5–15)
BUN: 12 mg/dL (ref 6–20)
CO2: 20 mmol/L — ABNORMAL LOW (ref 22–32)
Calcium: 8.1 mg/dL — ABNORMAL LOW (ref 8.9–10.3)
Chloride: 108 mmol/L (ref 98–111)
Creatinine, Ser: 0.97 mg/dL (ref 0.44–1.00)
GFR, Estimated: >60 mL/min (ref >60)
Glucose, Bld: 89 mg/dL (ref 70–99)
Potassium: 3.9 mmol/L (ref 3.5–5.1)
Sodium: 138 mmol/L (ref 135–145)

## 2023-10-23 LAB — MAGNESIUM: Magnesium: 1.9 mg/dL (ref 1.7–2.4)

## 2023-10-23 MED ORDER — CYCLOBENZAPRINE HCL 10 MG PO TABS
10.0000 mg | ORAL_TABLET | Freq: Three times a day (TID) | ORAL | Status: DC
  Administered 2023-10-23 – 2023-10-24 (×5): 10 mg via ORAL
  Filled 2023-10-23 (×5): qty 1

## 2023-10-23 MED ORDER — KETOROLAC TROMETHAMINE 15 MG/ML IJ SOLN: 15.0000 mg | Freq: Three times a day (TID) | INTRAMUSCULAR | Status: AC | PRN

## 2023-10-23 MED ORDER — KETOROLAC TROMETHAMINE 15 MG/ML IJ SOLN
15.0000 mg | Freq: Three times a day (TID) | INTRAMUSCULAR | Status: AC | PRN
  Administered 2023-10-23: 15 mg via INTRAVENOUS
  Filled 2023-10-23: qty 1

## 2023-10-23 NOTE — Progress Notes (Signed)
 Progress Note   Patient: Jacqueline [name] FMW:[id number] DOB: 08-17-2000 DOA: 10/17/2023     5 DOS: the patient was seen and examined on 10/23/2023    Brief Narrative:  23 y.o. female with medical history significant of migraines,Lupus anticoagulant, CVA,PE on Eliquis  ,with baseline right sided-weakness, thrombocytopenia, Vit B12 def, THC abuse, menorrhagia. Patient also has interim history of admission to 7/8-7/15 with diagnosis of PE insetting of noncompliance with Eliquis . Patient now returns to ED with multiple complaints including menorrhagia stating her period is longer than typical, as well as intractable migraine HA.  During hospitalization declined any kind of bleeding, remained stable on Eliquis .  She also worked with physical therapy and neurology in terms of her headache and weakness.  Neurology tried multiple medications.  Hemoglobin is stable.    Patient remains in the hospital.  Complains of headache and leg pain. Unsafe discharge, she has nowhere to go.  Subjective: Patient seen and examined.  Flat affect.  She tells me her headache comes in episodes and Toradol  helps.  She is also complaining of leg pain mostly on the left leg that is because of her lupus. On discussion about discharge planning, patient becomes more anxious and starts complaining of more pain.  Apparently she has no place to go.     Assessment & Plan:  Principal Problem:   Intractable migraine     Intractable migraine/headache History of left MCA CVA -Due to prior history of CVA she has reportedly has suffered from severe headaches.  MRI performed and seen by neurology team. Different medications were tried. Currently on amitriptyline  25 mg at bedtime, Klonopin  0.25 mg at bedtime, Flexeril  10 mg 3 times daily as needed, will change to scheduled doses.  Also on Lexapro  and gabapentin .  Limited trial of Toradol .  Avoiding long-term use as she is already using Eliquis .    Hypokalemia-replaced.   Adequate.  Hypomagnesemia Replaced.  Adequate.   Menorrhagia -This has been chronic for her.  Pelvic ultrasound in February 2025 was unremarkable.  Pelvic ultrasound, transabdominal was done.  5.2 cm right ovarian cyst.  Normal-appearing endometrium.  Currently no active bleeding.    Hx of recent CVA History of recent pulmonary embolism -She has history of antiphospholipid syndrome.  For now continue Eliquis .  Discussed with Dr. Onesimo.  No longer following with their service due to multiple no-shows.   Thrombocytopenia, chronic -No obvious signs of bleeding besides menorrhagia.  Need to follow up with outptn GYN     Medication noncompliance Patient counseled on compliance  Homelessness: Patient with multiple active medical issues.  Unsafe to discharge to the streets.  Social worker coordinating with her family.     DVT prophylaxis: apixaban  (ELIQUIS ) tablet 5 mg     Code Status: Full Code Family Communication: None today. Status is: Inpatient Remains inpatient appropriate because: Persistent migraine.  Unsafe discharge.   Examination:   General exam: Appears calm and comfortable.  Anxious.  Flat affect. Respiratory system: Clear to auscultation. Respiratory effort normal. Cardiovascular system: S1 & S2 heard, RRR. No JVD, murmurs, rubs, gallops or clicks. No pedal edema. Gastrointestinal system: Abdomen is nondistended, soft and nontender. No organomegaly or masses felt. Normal bowel sounds heard. Central nervous system: Alert and oriented. No focal neurological deficits. Extremities: Symmetric 5 x 5 power. Skin: No rashes, lesions or ulcers     Vitals:   10/22/23 1602 10/22/23 1615 10/22/23 1935 10/22/23 2353  BP: (!) 88/72 98/66 (!) 95/57 (!) 84/55  Pulse:  90 74  90  Resp:   20 20  Temp:   98.6 F (37 C) 98.5 F (36.9 C)  TempSrc:   Oral Oral  SpO2:   93% 97%  Weight:      Height:        Data Reviewed:     Latest Ref Rng & Units 10/23/2023    3:28 AM  10/22/2023    3:36 AM 10/21/2023   10:08 AM  CBC  WBC 4.0 - 10.5 K/uL 5.4  8.2  7.4   Hemoglobin 12.0 - 15.0 g/dL 9.5  9.9  89.9   Hematocrit 36.0 - 46.0 % 27.6  28.0  28.6   Platelets 150 - 400 K/uL 58  60  66        Latest Ref Rng & Units 10/23/2023    3:28 AM 10/22/2023    3:36 AM 10/21/2023   10:08 AM  BMP  Glucose 70 - 99 mg/dL 89  855  838   BUN 6 - 20 mg/dL 12  9  11    Creatinine 0.44 - 1.00 mg/dL 9.02  9.15  9.11   Sodium 135 - 145 mmol/L 138  136  134   Potassium 3.5 - 5.1 mmol/L 3.9  3.3  4.0   Chloride 98 - 111 mmol/L 108  109  108   CO2 22 - 32 mmol/L 20  18  19    Calcium  8.9 - 10.3 mg/dL 8.1  8.8  8.7       Author: Renato Applebaum, MD 10/23/2023 11:08 AM

## 2023-10-23 NOTE — Progress Notes (Signed)
 OT Cancellation Note  Patient Details Name: Jacqueline [name] MRN: [id number] DOB: 2000/05/08   Cancelled Treatment:    Reason Eval/Treat Not Completed: Fatigue/lethargy limiting ability to participate (Pt reporting she is sleepy even after having rest last night. Attempted to work with but still decline. She reported about getting up to use the [facility] without asisstance.)  Thais Silberstein K OTR/L  Acute Rehab Services  [phone] office number   Warrick Berber 10/23/2023, 12:00 PM

## 2023-10-23 NOTE — Progress Notes (Signed)
 Occupational Therapy Treatment Patient Details Name: Jacqueline [name] MRN: [id number] DOB: Oct 30, 2000 Today's Date: 10/23/2023   History of present illness Pt is a 23 yr old female who presented intractable migraine HA. PMH migraines, Lupus anticoagulant, CVA with baseline right sided-weakness, PE, Antiphospholipid Syndrome on Eliquis , thrombocytopenia, Vit B12 def, THC abuse, menorrhagia.   OT comments  Pt progressing towards goals.  Progressed to complete standing ADLs with set up assist for management of lids. Pt with difficulty completing visual scanning and attention tasks such as scanning a paragraph or turning the channel on the remote. With finger cues pt able to read words, but when asked to scan a page, pt unable to locate words or numbers. With prompting, reporting easily fatigues visually and can is visually overwhelmed. Continue to recommend Outpatient OT to optimize independence levels. Will continue to follow acutely      If plan is discharge home, recommend the following:  A little help with walking and/or transfers;A little help with bathing/dressing/bathroom;Assistance with cooking/housework;Assist for transportation   Equipment Recommendations  None recommended by OT       Precautions / Restrictions Precautions Precautions: Fall Recall of Precautions/Restrictions: Impaired Restrictions Weight Bearing Restrictions Per Provider Order: No       Mobility Bed Mobility Overal bed mobility: Modified Independent     General bed mobility comments: No assist, slow to complete    Transfers Overall transfer level: Needs assistance Equipment used: Hemi-walker Transfers: Sit to/from Stand Sit to Stand: Supervision     General transfer comment: S for safety with hemiwalker     Balance Overall balance assessment: Needs assistance Sitting-balance support: Feet supported Sitting balance-Leahy Scale: Good     Standing balance support: Single extremity  supported Standing balance-Leahy Scale: Fair       ADL either performed or assessed with clinical judgement   ADL Overall ADL's : Needs assistance/impaired     Grooming: Wash/dry face;Oral care;Set up;Standing Grooming Details (indicate cue type and reason): set up assist for management of lids             Lower Body Dressing: Minimal assistance;Sit to/from stand Lower Body Dressing Details (indicate cue type and reason): Assist to don RLE sock Toilet Transfer: Supervision/safety;Ambulation Toilet Transfer Details (indicate cue type and reason): Simulated in room hemiwalker         Functional mobility during ADLs: Supervision/safety General ADL Comments: hemiwalker    Extremity/Trunk Assessment Upper Extremity Assessment Upper Extremity Assessment: RUE deficits/detail RUE Deficits / Details: Hx of CVA, flaccid RUE, decreased AROM, pt reports using brace RUE Sensation: decreased light touch RUE Coordination: decreased fine motor;decreased gross motor   Lower Extremity Assessment Lower Extremity Assessment: Defer to PT evaluation                 Communication Communication Communication: Impaired Factors Affecting Communication: Difficulty expressing self   Cognition Arousal: Lethargic Behavior During Therapy: Flat affect Cognition: Cognition impaired     Awareness: Online awareness impaired   Attention impairment (select first level of impairment): Selective attention Executive functioning impairment (select all impairments): Problem solving, Sequencing OT - Cognition Comments: pt with difficulty sequencing unfamilar tasks, even with step by step cues, decreased visual scanning and visual attention impacting executive functioning skills       Following commands: Impaired Following commands impaired: Follows one step commands with increased time      Cueing   Cueing Techniques: Verbal cues, Tactile cues        General Comments Visual scanning and  attention activity performed, pt with difficulty scanning phone and sequencing task to order meal, RN notifed, pt placed on automatic tray    Pertinent Vitals/ Pain       Pain Assessment Pain Assessment: 0-10 Pain Score: 10-Worst pain ever Pain Location: RLE Pain Descriptors / Indicators: Discomfort, Grimacing Pain Intervention(s): Monitored during session   Frequency  Min 2X/week        Progress Toward Goals  OT Goals(current goals can now be found in the care plan section)  Progress towards OT goals: Progressing toward goals  Acute Rehab OT Goals Patient Stated Goal: To rest OT Goal Formulation: With patient Time For Goal Achievement: 11/03/23 Potential to Achieve Goals: Good ADL Goals Pt Will Perform Upper Body Bathing: with modified independence;sitting Pt Will Perform Lower Body Bathing: with modified independence;sit to/from stand Pt Will Perform Upper Body Dressing: with modified independence;sitting Pt Will Perform Lower Body Dressing: with modified independence;sit to/from stand Pt Will Transfer to Toilet: with modified independence  Plan         AM-PAC OT 6 Clicks Daily Activity     Outcome Measure   Help from another person eating meals?: A Little Help from another person taking care of personal grooming?: A Little Help from another person toileting, which includes using toliet, bedpan, or urinal?: A Little Help from another person bathing (including washing, rinsing, drying)?: A Little Help from another person to put on and taking off regular upper body clothing?: A Little Help from another person to put on and taking off regular lower body clothing?: A Little 6 Click Score: 18    End of Session Equipment Utilized During Treatment: Other (comment) (Hemiwalker)  OT Visit Diagnosis: Unsteadiness on feet (R26.81);Other abnormalities of gait and mobility (R26.89);Muscle weakness (generalized) (M62.81);Pain   Activity Tolerance Patient tolerated treatment  well   Patient Left in bed;with call bell/phone within reach;with bed alarm set   Nurse Communication Mobility status        Time: 8484-8450 OT Time Calculation (min): 34 min  Charges: OT General Charges $OT Visit: 1 Visit OT Treatments $Self Care/Home Management : 23-37 mins  Adrianne BROCKS, OT  Acute Rehabilitation Services Office [phone] Secure chat preferred   Adrianne GORMAN Savers 10/23/2023, 4:59 PM

## 2023-10-24 MED ORDER — KETOROLAC TROMETHAMINE 15 MG/ML IJ SOLN
15.0000 mg | Freq: Once | INTRAMUSCULAR | Status: AC
  Administered 2023-10-24: 15 mg via INTRAVENOUS
  Filled 2023-10-24: qty 1

## 2023-10-24 MED ORDER — GABAPENTIN 400 MG PO CAPS
400.0000 mg | ORAL_CAPSULE | Freq: Three times a day (TID) | ORAL | Status: DC
Start: 2023-10-24 — End: 2023-10-26
  Administered 2023-10-24 – 2023-10-25 (×5): 400 mg via ORAL
  Filled 2023-10-24 (×5): qty 1

## 2023-10-24 MED ORDER — DIPHENHYDRAMINE HCL 50 MG/ML IJ SOLN
25.0000 mg | Freq: Once | INTRAMUSCULAR | Status: AC
  Administered 2023-10-24: 25 mg via INTRAVENOUS
  Filled 2023-10-24: qty 1

## 2023-10-24 MED ORDER — SUMATRIPTAN SUCCINATE 50 MG PO TABS
50.0000 mg | ORAL_TABLET | ORAL | Status: DC | PRN
  Filled 2023-10-24: qty 1

## 2023-10-24 MED ORDER — OXYCODONE HCL 5 MG PO TABS
5.0000 mg | ORAL_TABLET | ORAL | Status: DC | PRN
  Administered 2023-10-24 – 2023-10-25 (×5): 5 mg via ORAL
  Filled 2023-10-24 (×5): qty 1

## 2023-10-24 MED ORDER — SUMATRIPTAN 20 MG/ACT NA SOLN
20.0000 mg | NASAL | Status: DC | PRN
  Administered 2023-10-24: 20 mg via NASAL
  Filled 2023-10-24: qty 1

## 2023-10-24 MED ORDER — PREDNISONE 20 MG PO TABS
40.0000 mg | ORAL_TABLET | Freq: Every day | ORAL | Status: DC
  Administered 2023-10-24 – 2023-10-25 (×2): 40 mg via ORAL
  Filled 2023-10-24 (×2): qty 2

## 2023-10-24 MED ORDER — PROCHLORPERAZINE EDISYLATE 10 MG/2ML IJ SOLN
5.0000 mg | Freq: Once | INTRAMUSCULAR | Status: AC
  Administered 2023-10-24: 5 mg via INTRAVENOUS
  Filled 2023-10-24: qty 2

## 2023-10-24 NOTE — Plan of Care (Signed)

## 2023-10-24 NOTE — TOC Progression Note (Signed)
 Transition of Care (TOC) - Progression Note  Rayfield Gobble RN, BSN Inpatient Care Management Unit 4E- RN Case Manager See Treatment Team for direct phone #   Patient Details  Name: Jacqueline [name] MRN: [id number] Date of Birth: 04-05-2000  Transition of Care [facility]) CM/SW Contact  Gobble Rayfield Hurst, RN Phone Number: 10/24/2023, 10:06 AM  Clinical Narrative:    Notified by MD that pt may be ready for discharge today if cleared by Neuro.   Cm went to speak with pt at bedside to see if she and her sister have come up with a plan for a place to go. Pt voiced that she still does not have a place to discharge to. Pt placed call to sister Future and CM explained that current recommendations by therapy are for outpt follow up. Explained to pt and sister that there are no options for placement and that they would need to come up with a safe plan for discharge.  Asked pt about outpt PT/OT follow up pt voiced she did not go to outpt after last discharge and referral made. CM will hold off on referral to outpt as unknown plan for discharge at this time.   Pt able to get to [facility] with standby assist from CM while CM at bedside.   IP CM to continue to follow   Expected Discharge Plan: OP Rehab Barriers to Discharge: Continued Medical Work up               Expected Discharge Plan and Services   Discharge Planning Services: CM Consult Post Acute Care Choice: NA Living arrangements for the past 2 months: Single Family Home                 DME Arranged: N/A DME Agency: NA       HH Arranged: NA HH Agency: NA         Social Drivers of Health (SDOH) Interventions SDOH Screenings   Food Insecurity: No Food Insecurity (10/19/2023)  Housing: Low Risk  (10/19/2023)  Transportation Needs: Unmet Transportation Needs (10/19/2023)  Utilities: Not At Risk (10/19/2023)  Financial Resource Strain: Not on File (06/21/2021)   Received from [facility]  Physical Activity: Not on File (06/21/2021)    Received from [facility]  Social Connections: Not on File (11/16/2022)   Received from [facility]  Stress: Not on File (06/21/2021)   Received from [facility]  Tobacco Use: Medium Risk (10/17/2023)    Readmission Risk Interventions    09/15/2023   12:34 PM 05/09/2023    1:23 PM  Readmission Risk Prevention Plan  Transportation Screening Complete Complete  PCP or Specialist Appt within 3-5 Days Complete Complete  HRI or Home Care Consult Complete Complete  Social Work Consult for Recovery Care Planning/Counseling Complete Complete  Palliative Care Screening Not Applicable Not Applicable  Medication Review Oceanographer) Complete Complete

## 2023-10-24 NOTE — Progress Notes (Signed)
 Progress Note   Patient: Jacqueline [name] FMW:[id number] DOB: [birth year] DOA: 10/17/2023     6 DOS: the patient was seen and examined on 10/24/2023    Brief Narrative:  23 y.o. female with medical history significant of migraines,Lupus anticoagulant, CVA,PE on Eliquis  ,with baseline right sided-weakness, thrombocytopenia, Vit B12 def, THC abuse, menorrhagia. Patient also has interim history of admission to 7/8-7/15 with diagnosis of PE insetting of noncompliance with Eliquis . Patient now returns to ED with multiple complaints including menorrhagia stating her period is longer than typical, as well as intractable migraine HA.  During hospitalization declined any kind of bleeding, remained stable on Eliquis .  She also worked with physical therapy and neurology in terms of her headache and weakness.  Neurology tried multiple medications.  Hemoglobin is stable.    Patient remains in the hospital.  Complains of headache and leg pain. Unsafe discharge, she has nowhere to go.  Subjective: Patient seen and examined.  She was very anxious on approach.  When I told her that I am not going to discharge her until she is feeling better and she opened up.  Patient tells me that her left leg still hurts and wants me to try some different medications.  Will start oxycodone  today.  Increase dose of gabapentin .     Assessment & Plan:  Principal Problem:   Intractable migraine     Intractable migraine/headache History of left MCA CVA -Due to prior history of CVA she has reportedly has suffered from severe headaches.  MRI performed and seen by neurology team. Different medications were tried. Currently on amitriptyline  25 mg at bedtime, Klonopin  0.25 mg at bedtime, Flexeril  10 mg 3 times daily.  Gabapentin  200 mg 3 times daily, will increase to 400 mg 3 times daily today.  Limited trial of Toradol .  Avoiding long-term use as she is already using Eliquis .  Add oxycodone  today.  Left leg pain: Likely  musculoskeletal pain.  Pain management approach as above.   Hypokalemia-replaced.  Adequate.  Hypomagnesemia Replaced.  Adequate.   Menorrhagia -This has been chronic for her.  Pelvic ultrasound in February 2025 was unremarkable.  Pelvic ultrasound, transabdominal was done.  5.2 cm right ovarian cyst.  Normal-appearing endometrium.  Currently no active bleeding.    Hx of recent CVA History of recent pulmonary embolism -She has history of antiphospholipid syndrome.  For now continue Eliquis .  Discussed with Dr. Onesimo.  No longer following with their service due to multiple no-shows.   Thrombocytopenia, chronic -No obvious signs of bleeding besides menorrhagia.  Need to follow up with outpt GYN     Medication noncompliance Patient counseled on compliance  Homelessness: Patient with multiple active medical issues.  Unsafe to discharge to the streets.  Social worker coordinating with her family.  She may likely need to stay in the hospital until clinically stabilized. I am worried some of her symptoms not getting better or getting exacerbated due to fear of being discharged.     DVT prophylaxis: apixaban  (ELIQUIS ) tablet 5 mg     Code Status: Full Code Family Communication: None  Status is: Inpatient Remains inpatient appropriate because: Persistent migraine.  Unsafe discharge.   Examination:   General exam: Appears calm and comfortable.  Anxious.  Flat affect. Respiratory system: Clear to auscultation. Respiratory effort normal. Cardiovascular system: S1 & S2 heard, RRR. No JVD, murmurs, rubs, gallops or clicks. No pedal edema. Gastrointestinal system: Abdomen is nondistended, soft and nontender. No organomegaly or masses felt. Normal bowel sounds heard.  Central nervous system: Alert and oriented. No focal neurological deficits. Extremities: Symmetric 5 x 5 power. Skin: No rashes, lesions or ulcers Patient has hyperesthesia of the legs, no palpable tenderness, erythema.      Vitals:   10/23/23 1602 10/23/23 1938 10/24/23 0237 10/24/23 0748  BP: (!) 90/56 (!) 88/44 (!) 87/55 104/61  Pulse: 95 92 90   Resp:  18 18   Temp: 97.6 F (36.4 C) 98.2 F (36.8 C) 98.7 F (37.1 C) 97.8 F (36.6 C)  TempSrc: Tympanic Oral Oral Axillary  SpO2: 100% 100% 100% 99%  Weight:      Height:        Data Reviewed:     Latest Ref Rng & Units 10/23/2023    3:28 AM 10/22/2023    3:36 AM 10/21/2023   10:08 AM  CBC  WBC 4.0 - 10.5 K/uL 5.4  8.2  7.4   Hemoglobin 12.0 - 15.0 g/dL 9.5  9.9  89.9   Hematocrit 36.0 - 46.0 % 27.6  28.0  28.6   Platelets 150 - 400 K/uL 58  60  66        Latest Ref Rng & Units 10/23/2023    3:28 AM 10/22/2023    3:36 AM 10/21/2023   10:08 AM  BMP  Glucose 70 - 99 mg/dL 89  855  838   BUN 6 - 20 mg/dL 12  9  11    Creatinine 0.44 - 1.00 mg/dL 9.02  9.15  9.11   Sodium 135 - 145 mmol/L 138  136  134   Potassium 3.5 - 5.1 mmol/L 3.9  3.3  4.0   Chloride 98 - 111 mmol/L 108  109  108   CO2 22 - 32 mmol/L 20  18  19    Calcium  8.9 - 10.3 mg/dL 8.1  8.8  8.7       Author: Renato Applebaum, MD 10/24/2023 10:58 AM

## 2023-10-24 NOTE — Progress Notes (Signed)
 Physical Therapy Progress Note Patient Details Name: Jacqueline [name] MRN: [id number] DOB: April 25, 2000 Today's Date: 10/24/2023   History of Present Illness Pt is a 23 yr old female who presented intractable migraine HA. PMH migraines, Lupus anticoagulant, CVA with baseline right sided-weakness, PE, Antiphospholipid Syndrome on Eliquis , thrombocytopenia, Vit B12 def, THC abuse, menorrhagia.    PT Comments  Pt iis progressing towards goals. Goals updated as needed today. Currently pt is Mod I for bed mobility, sit to stand and close supervision for gait with hemi walker. Pt states that she is moving close to her baseline and had 2 falls prior to hospitalization due to loss of balance. Pt R ankle rolling into inversion with gait but she is able to correct with 3x loss of balance posteriorly correcting with hip strategy. Pt is limited due to migraine pain and difficulty tolerating business, noise and lights in the hall. Pt stayed in room during activity with good participation. Pt demonstrates good strength and overall good safety techniques. Due to pt current functional status, home set up and available assistance at home recommending skilled physical therapy services 3x/week in order to address strength, balance and functional mobility to decrease risk for falls, injury and re-hospitalization.       If plan is discharge home, recommend the following: A little help with bathing/dressing/bathroom;Assistance with cooking/housework;Assist for transportation;Help with stairs or ramp for entrance     Equipment Recommendations  None recommended by PT       Precautions / Restrictions Precautions Precautions: Fall Recall of Precautions/Restrictions: Impaired Restrictions Weight Bearing Restrictions Per Provider Order: No     Mobility  Bed Mobility Overal bed mobility: Modified Independent Bed Mobility: Supine to Sit, Sit to Supine Rolling: Modified independent (Device/Increase time)     Sit to  supine: Modified independent (Device/Increase time)   General bed mobility comments: No assist, slow to complete    Transfers Overall transfer level: Modified independent Equipment used: Hemi-walker Transfers: Sit to/from Stand Sit to Stand: Modified independent (Device/Increase time)                Ambulation/Gait Ambulation/Gait assistance: Supervision Gait Distance (Feet): 50 Feet Assistive device: Hemi-walker Gait Pattern/deviations: Decreased step length - right, Decreased dorsiflexion - right, Step-through pattern Gait velocity: decreased Gait velocity interpretation: <1.31 ft/sec, indicative of household ambulator   General Gait Details: Inversion at the R ankle; pt states that she has a brace normally. Slow gait with minimally partial step through gait pattern.     Balance Overall balance assessment: Needs assistance Sitting-balance support: Feet supported Sitting balance-Leahy Scale: Normal     Standing balance support: Single extremity supported Standing balance-Leahy Scale: Fair Standing balance comment: Pt is able to compensate with hip strategy but 3x posterior LOB with correction utilizing hip strategy during 50 ft of gait with close supervision and hemi walker        Communication Communication Communication: Impaired Factors Affecting Communication: Difficulty expressing self (difficulty with word finding and occasionally with understanding words)  Cognition Arousal: Alert Behavior During Therapy: Flat affect   PT - Cognitive impairments: No apparent impairments     Following commands: Intact      Cueing Cueing Techniques: Verbal cues         Pertinent Vitals/Pain Pain Assessment Pain Assessment: 0-10 Pain Score: 10-Worst pain ever Pain Location: headache Pain Descriptors / Indicators: Aching, Headache Pain Intervention(s): Monitored during session           PT Goals (current goals can now be found  in the care plan section) Acute  Rehab PT Goals Patient Stated Goal: None stated PT Goal Formulation: With patient Time For Goal Achievement: 11/07/23 Potential to Achieve Goals: Fair Progress towards PT goals: Progressing toward goals    Frequency    Min 1X/week      PT Plan  Decreased frequency       AM-PAC PT 6 Clicks Mobility   Outcome Measure  Help needed turning from your back to your side while in a flat bed without using bedrails?: None Help needed moving from lying on your back to sitting on the side of a flat bed without using bedrails?: None Help needed moving to and from a bed to a chair (including a wheelchair)?: None Help needed standing up from a chair using your arms (e.g., wheelchair or bedside chair)?: None Help needed to walk in hospital room?: A Little Help needed climbing 3-5 steps with a railing? : A Lot 6 Click Score: 21    End of Session Equipment Utilized During Treatment: Gait belt Activity Tolerance: Patient tolerated treatment well Patient left: in bed;with call bell/phone within reach Nurse Communication: Mobility status PT Visit Diagnosis: Muscle weakness (generalized) (M62.81)     Time: 8960-8896 PT Time Calculation (min) (ACUTE ONLY): 24 min  Charges:    $Therapeutic Activity: 23-37 mins PT General Charges $$ ACUTE PT VISIT: 1 Visit                     Dorothyann Maier, DPT, CLT  Acute Rehabilitation Services Office: [phone] (Secure chat preferred)    Dorothyann VEAR Maier 10/24/2023, 11:15 AM

## 2023-10-25 ENCOUNTER — Inpatient Hospital Stay (HOSPITAL_COMMUNITY): Payer: Self-pay

## 2023-10-25 DIAGNOSIS — M79605 Pain in left leg: Secondary | ICD-10-CM

## 2023-10-25 DIAGNOSIS — M79604 Pain in right leg: Secondary | ICD-10-CM

## 2023-10-25 DIAGNOSIS — Z5900 Homelessness unspecified: Secondary | ICD-10-CM

## 2023-10-25 DIAGNOSIS — G43819 Other migraine, intractable, without status migrainosus: Secondary | ICD-10-CM

## 2023-10-25 LAB — COMPREHENSIVE METABOLIC PANEL WITH GFR
ALT: 10 U/L (ref 0–44)
AST: 18 U/L (ref 15–41)
Albumin: 3.4 g/dL — ABNORMAL LOW (ref 3.5–5.0)
Alkaline Phosphatase: 66 U/L (ref 38–126)
Anion gap: 8 (ref 5–15)
BUN: 11 mg/dL (ref 6–20)
CO2: 21 mmol/L — ABNORMAL LOW (ref 22–32)
Calcium: 8.8 mg/dL — ABNORMAL LOW (ref 8.9–10.3)
Chloride: 105 mmol/L (ref 98–111)
Creatinine, Ser: 0.76 mg/dL (ref 0.44–1.00)
GFR, Estimated: >60 mL/min (ref >60)
Glucose, Bld: 130 mg/dL — ABNORMAL HIGH (ref 70–99)
Potassium: 4.1 mmol/L (ref 3.5–5.1)
Sodium: 134 mmol/L — ABNORMAL LOW (ref 135–145)
Total Bilirubin: <0.2 mg/dL (ref 0.0–1.2)
Total Protein: 6.7 g/dL (ref 6.5–8.1)

## 2023-10-25 LAB — CBC
HCT: 29.6 % — ABNORMAL LOW (ref 36.0–46.0)
Hemoglobin: 10.2 g/dL — ABNORMAL LOW (ref 12.0–15.0)
MCH: 27.1 pg (ref 26.0–34.0)
MCHC: 34.5 g/dL (ref 30.0–36.0)
MCV: 78.7 fL — ABNORMAL LOW (ref 80.0–100.0)
Platelets: 70 K/uL — ABNORMAL LOW (ref 150–400)
RBC: 3.76 MIL/uL — ABNORMAL LOW (ref 3.87–5.11)
RDW: 13 % (ref 11.5–15.5)
WBC: 6.2 K/uL (ref 4.0–10.5)
nRBC: 0 % (ref 0.0–0.2)

## 2023-10-25 LAB — IRON AND TIBC
Iron: 20 ug/dL — ABNORMAL LOW (ref 28–170)
Saturation Ratios: 5 % — ABNORMAL LOW (ref 10.4–31.8)
TIBC: 378 ug/dL (ref 250–450)
UIBC: 358 ug/dL

## 2023-10-25 LAB — CK: Total CK: 44 U/L (ref 38–234)

## 2023-10-25 LAB — VITAMIN B12: Vitamin B-12: 403 pg/mL (ref 180–914)

## 2023-10-25 LAB — RETICULOCYTES
Immature Retic Fract: 18.1 % — ABNORMAL HIGH (ref 2.3–15.9)
RBC.: 3.5 MIL/uL — ABNORMAL LOW (ref 3.87–5.11)
Retic Count, Absolute: 49.3 K/uL (ref 19.0–186.0)
Retic Ct Pct: 1.4 % (ref 0.4–3.1)

## 2023-10-25 LAB — MAGNESIUM: Magnesium: 1.8 mg/dL (ref 1.7–2.4)

## 2023-10-25 LAB — FOLATE: Folate: 5.5 ng/mL — ABNORMAL LOW (ref >5.9)

## 2023-10-25 LAB — SEDIMENTATION RATE: Sed Rate: 36 mm/h — ABNORMAL HIGH (ref 0–22)

## 2023-10-25 LAB — FERRITIN: Ferritin: 7 ng/mL — ABNORMAL LOW (ref 11–307)

## 2023-10-25 MED ORDER — BUTALBITAL-APAP-CAFFEINE 50-325-40 MG PO TABS
1.0000 | ORAL_TABLET | Freq: Four times a day (QID) | ORAL | Status: DC | PRN
  Administered 2023-10-25 – 2023-10-26 (×3): 1 via ORAL
  Filled 2023-10-25 (×3): qty 1

## 2023-10-25 MED ORDER — PROPRANOLOL HCL 10 MG PO TABS
20.0000 mg | ORAL_TABLET | Freq: Two times a day (BID) | ORAL | Status: DC
  Administered 2023-10-25 – 2023-10-26 (×2): 20 mg via ORAL
  Filled 2023-10-25 (×2): qty 2

## 2023-10-25 MED ORDER — POLYETHYLENE GLYCOL 3350 17 G PO PACK: 17.0000 g | PACK | Freq: Two times a day (BID) | ORAL | Status: DC | PRN

## 2023-10-25 MED ORDER — SENNOSIDES-DOCUSATE SODIUM 8.6-50 MG PO TABS
2.0000 | ORAL_TABLET | Freq: Two times a day (BID) | ORAL | Status: AC
  Administered 2023-10-25 (×2): 2 via ORAL
  Filled 2023-10-25 (×2): qty 2

## 2023-10-25 MED ORDER — SODIUM CHLORIDE 0.9 % IV SOLN
300.0000 mg | Freq: Once | INTRAVENOUS | Status: AC
  Administered 2023-10-25: 300 mg via INTRAVENOUS
  Filled 2023-10-25: qty 15

## 2023-10-25 MED ORDER — AMITRIPTYLINE HCL 25 MG PO TABS: 50.0000 mg | ORAL_TABLET | Freq: Every day | ORAL | Status: DC

## 2023-10-25 MED ORDER — SUMATRIPTAN SUCCINATE 6 MG/0.5ML ~~LOC~~ SOLN
6.0000 mg | SUBCUTANEOUS | Status: DC | PRN
  Filled 2023-10-25: qty 0.5

## 2023-10-25 MED ORDER — VITAMIN B-12 1000 MCG PO TABS
1000.0000 ug | ORAL_TABLET | Freq: Every day | ORAL | Status: DC
  Administered 2023-10-25 – 2023-10-26 (×2): 1000 ug via ORAL
  Filled 2023-10-25 (×2): qty 1

## 2023-10-25 MED ORDER — KETOROLAC TROMETHAMINE 15 MG/ML IJ SOLN
15.0000 mg | Freq: Once | INTRAMUSCULAR | Status: AC
  Administered 2023-10-25: 15 mg via INTRAVENOUS
  Filled 2023-10-25: qty 1

## 2023-10-25 MED ORDER — IRON SUCROSE 300 MG IVPB - SIMPLE MED
300.0000 mg | Freq: Once | Status: DC
  Filled 2023-10-25: qty 265

## 2023-10-25 MED ORDER — SENNOSIDES-DOCUSATE SODIUM 8.6-50 MG PO TABS: 2.0000 | ORAL_TABLET | Freq: Two times a day (BID) | ORAL | Status: DC | PRN

## 2023-10-25 MED ORDER — POLYETHYLENE GLYCOL 3350 17 G PO PACK
17.0000 g | PACK | Freq: Two times a day (BID) | ORAL | Status: AC
  Filled 2023-10-25 (×2): qty 1

## 2023-10-25 MED ORDER — FOLIC ACID 1 MG PO TABS
1.0000 mg | ORAL_TABLET | Freq: Every day | ORAL | Status: DC
  Administered 2023-10-25 – 2023-10-26 (×2): 1 mg via ORAL
  Filled 2023-10-25 (×2): qty 1

## 2023-10-25 NOTE — Progress Notes (Signed)
   10/25/23 1242  Spiritual Encounters  Type of Visit Attempt (pt unavailable)  Referral source Nurse (RN/NT/LPN)  Reason for visit Routine spiritual support  OnCall Visit Yes   Attempted to visit with patient. Patient was sleeping. Will leave consult for spiritual care open.

## 2023-10-25 NOTE — Progress Notes (Signed)
 PROGRESS NOTE  Jacqueline [name] FMW:[id number] DOB: Jan 31, 2001   PCP: Pcp, No  Patient is from: Homeless.  Lived with sister before coming to hospital.   DOA: 10/17/2023 LOS: 7  Chief complaints No chief complaint on file.    Brief Narrative / Interim history: 23 year old F with PMH of CVA with RUE paresis and aphasia, PE and lupus anticoagulant on Eliquis , migraine headache, abnormal uterine bleeding (menorrhagia), THC abuse, thrombocytopenia and vitamin B12 deficiency presenting with heavy menstrual period (longer than usual) and intractable headache, and admitted with intractable migraine headache.  MRI brain without acute finding but some chronic changes.  Pelvic ultrasound showed 5.2 cm right ovarian cyst and normal appearance of endometrium.  Patient has not had further bleeding in the hospital.  Neurology consulted and she was tried on multiple medications for her migraine headache.  However, she continues to complain of significant headache.  Also concerned about safe disposition since she is homeless and she cannot return to live with her sister.   Subjective: Seen and examined earlier this morning.  No major events overnight or this morning.  Reports bilateral lower extremity pain below her knees anteriorly.  She rates her pain 10/10 although she does not appear to be in that distress.  When I explained what 10/10 pain mean, she rated the pain 8/10.  She also reports significant headache anteriorly and bilaterally.  Describes the pain as throbbing.  She reports photophobia and phonophobia.  Denies nausea or vomiting.  No new focal neurodeficit.  She is asking for pain medication that helps her calm down.  Not able to specify the name of the medicine.   Objective: Vitals:   10/24/23 1951 10/24/23 2327 10/25/23 0524 10/25/23 0746  BP: 129/78 116/73 118/69 112/66  Pulse: (!) 102 99 100 92  Resp: 20 18 18 19   Temp: 97.9 F (36.6 C) 98.2 F (36.8 C) 98.6 F (37 C) 98.6 F (37 C)   TempSrc: Oral Oral Oral Oral  SpO2: 100% 100% 99% 100%  Weight:      Height:        Examination:  GENERAL: No apparent distress.  Nontoxic. HEENT: MMM.  Vision and hearing grossly intact.  NECK: Supple.  No apparent JVD.  RESP:  No IWOB.  Fair aeration bilaterally. CVS:  RRR. Heart sounds normal.  ABD/GI/GU: BS+. Abd soft, NTND.  MSK/EXT:  Moves extremities. No apparent deformity.  Tenderness in left shin. SKIN: no apparent skin lesion or wound NEURO: AA.  Oriented x 4 except date.  Seems to have expressive and receptive aphasia.  RUE paresis with contracture. PSYCH: Calm. Normal affect.   Consultants:  Neurology  Procedures: None  Microbiology summarized: None  Assessment and plan: Intractable migraine/headache: Continues to endorse significant pain that she describes as throbbing.  Reports associated photophobia phonophobia.  No nausea or vomiting.  No new focal neurodeficit.  MRI brain without acute finding.  Patient is not a great historian partly due to expressive or receptive of patient.  Also question if her social circumstance is playing a role here.  She does not appear to be in much distress to go along with the amount of pain she reports. - Increase amitriptyline  to 50 mg at night - Discontinue oxycodone .  Not a great choice - Continue gabapentin  at 400 mg 3 times daily - Continue p.o. prednisone  40 mg daily  Bilateral lower extremity pain: Some swelling in her left leg.  Also tenderness across anterior shin.  No erythema.  Denies apparent  injury or trauma. 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  No further bleeding since admission.  Pelvic ultrasound showed 5.2 cm right ovarian cyst normal-appearing endometrium. Recent Labs    09/17/23 2158  09/17/23 2228 09/24/23 0216 10/18/23 0001 10/19/23 0114 10/20/23 0504 10/21/23 1008 10/22/23 0336 10/23/23 0328 10/25/23 0943  HGB 12.9 12.9 12.2 10.6* 9.4* 8.6* 10.0* 9.9* 9.5* 10.2*  - Check anemia panel - Monitor H&H  Recent pulmonary embolism/antiphospholipid syndrome: Previous provider discussed with Dr. Onesimo.  No longer following with their service due to multiple no-shows. -Continue Eliquis . -She will need to reestablish care with hematology  Thrombocytopenia, chronic: Stable  Right ovarian cyst - Repeat ultrasound recommended in 3 to 6 months  Hypokalemia/hypomagnesemia -Monitor replenish K and Mg as appropriate     Medication noncompliance -Patient counseled on compliance  Constipation - Bowel regimen   Homelessness: She says she lives with her sister before this hospitalization.  Now she cannot go back to stay with his sister.  She has complex medical issues.  Social worker coordinating with her family.    Class I obesity Body mass index is 32.96 kg/m.          DVT prophylaxis:   apixaban  (ELIQUIS ) tablet 5 mg  Code Status: Full code Family Communication: None at bedside Level of care: Med-Surg Status is: Inpatient Remains inpatient appropriate because: Intractable migraine headache and lack of safe disposition   Final disposition: To be determined   55 minutes with more than 50% spent in reviewing records, counseling patient/family and coordinating care.   Sch Meds:  Scheduled Meds:  amitriptyline   50 mg Oral QHS   apixaban   5 mg Oral BID   atorvastatin   20 mg Oral Daily   escitalopram   10 mg Oral Daily   gabapentin   400 mg Oral TID   polyethylene glycol  17 g Oral BID   predniSONE   40 mg Oral Q breakfast   senna-docusate  2 tablet Oral BID   Continuous Infusions: PRN Meds:.acetaminophen , ALPRAZolam , glucagon  (human recombinant), hydrALAZINE , ipratropium-albuterol , metoprolol  tartrate, Muscle Rub, ondansetron  (ZOFRAN ) IV, mouth rinse,  polyethylene glycol **FOLLOWED BY** [START ON 10/26/2023] polyethylene glycol, promethazine , senna-docusate **FOLLOWED BY** [START ON 10/26/2023] senna-docusate, SUMAtriptan   Antimicrobials: Anti-infectives (From admission, onward)    None        I have personally reviewed the following labs and images: CBC: Recent Labs  Lab 10/19/23 0114 10/20/23 0504 10/21/23 1008 10/22/23 0336 10/23/23 0328 10/25/23 0943  WBC 4.3 3.8* 7.4 8.2 5.4 6.2  NEUTROABS 2.6  --   --   --   --   --   HGB 9.4* 8.6* 10.0* 9.9* 9.5* 10.2*  HCT 27.7* 25.2* 28.6* 28.0* 27.6* 29.6*  MCV 81.5 81.3 79.4* 78.7* 79.3* 78.7*  PLT 55* 48* 66* 60* 58* 70*   BMP &GFR Recent Labs  Lab 10/19/23 0114 10/20/23 0504 10/21/23 1008 10/22/23 0336 10/23/23 0328  NA 139 140 134* 136 138  K 3.6 3.6 4.0 3.3* 3.9  CL 112* 114* 108 109 108  CO2 19* 19* 19* 18* 20*  GLUCOSE 124* 91 161* 144* 89  BUN 8 8 11 9 12   CREATININE 0.82 0.97 0.88 0.84 0.97  CALCIUM  8.4* 8.1* 8.7* 8.8* 8.1*  MG 2.4 2.0 1.7 1.6* 1.9   Estimated  Creatinine Clearance: 94.8 mL/min (by C-G formula based on SCr of 0.97 mg/dL). Liver & Pancreas: Recent Labs  Lab 10/19/23 0114  AST 19  ALT 11  ALKPHOS 63  BILITOT 0.3  PROT 5.7*  ALBUMIN 2.9*   No results for input(s): LIPASE, AMYLASE in the last 168 hours. No results for input(s): AMMONIA in the last 168 hours. Diabetic: No results for input(s): HGBA1C in the last 72 hours. No results for input(s): GLUCAP in the last 168 hours. Cardiac Enzymes: No results for input(s): CKTOTAL, CKMB, CKMBINDEX, TROPONINI in the last 168 hours. No results for input(s): PROBNP in the last 8760 hours. Coagulation Profile: No results for input(s): INR, PROTIME in the last 168 hours. Thyroid Function Tests: No results for input(s): TSH, T4TOTAL, FREET4, T3FREE, THYROIDAB in the last 72 hours. Lipid Profile: No results for input(s): CHOL, HDL, LDLCALC, TRIG,  CHOLHDL, LDLDIRECT in the last 72 hours. Anemia Panel: No results for input(s): VITAMINB12, FOLATE, FERRITIN, TIBC, IRON , RETICCTPCT in the last 72 hours. Urine analysis:    Component Value Date/Time   COLORURINE YELLOW 10/18/2023 0051   APPEARANCEUR HAZY (A) 10/18/2023 0051   LABSPEC 1.008 10/18/2023 0051   PHURINE 7.0 10/18/2023 0051   GLUCOSEU NEGATIVE 10/18/2023 0051   HGBUR NEGATIVE 10/18/2023 0051   BILIRUBINUR NEGATIVE 10/18/2023 0051   BILIRUBINUR negative 10/11/2023 1807   KETONESUR NEGATIVE 10/18/2023 0051   PROTEINUR NEGATIVE 10/18/2023 0051   UROBILINOGEN 0.2 10/11/2023 1807   UROBILINOGEN 1.0 05/08/2021 1944   NITRITE NEGATIVE 10/18/2023 0051   LEUKOCYTESUR SMALL (A) 10/18/2023 0051   Sepsis Labs: Invalid input(s): PROCALCITONIN, LACTICIDVEN  Microbiology: No results found for this or any previous visit (from the past 240 hours).  Radiology Studies: No results found.    Darshan Solanki T. Zaide Mcclenahan [facility]  If 7PM-7AM, please contact night-coverage [email or web address] 10/25/2023, 10:52 AM

## 2023-10-25 NOTE — Plan of Care (Signed)
  Problem: Education: Goal: Knowledge of General Education information will improve Description: Including pain rating scale, medication(s)/side effects and non-pharmacologic comfort measures Outcome: Progressing   Problem: Health Behavior/Discharge Planning: Goal: Ability to manage health-related needs will improve Outcome: Progressing   Problem: Clinical Measurements: Goal: Ability to maintain clinical measurements within normal limits will improve Outcome: Progressing Goal: Will remain free from infection Outcome: Progressing Goal: Diagnostic test results will improve Outcome: Progressing Goal: Respiratory complications will improve Outcome: Progressing Goal: Cardiovascular complication will be avoided Outcome: Progressing   Problem: Activity: Goal: Risk for activity intolerance will decrease Outcome: Progressing   Problem: Nutrition: Goal: Adequate nutrition will be maintained Outcome: Progressing   Problem: Coping: Goal: Level of anxiety will decrease Outcome: Progressing   Problem: Elimination: Goal: Will not experience complications related to urinary retention Outcome: Progressing   Problem: Safety: Goal: Ability to remain free from injury will improve Outcome: Progressing   Problem: Skin Integrity: Goal: Risk for impaired skin integrity will decrease Outcome: Progressing   

## 2023-10-26 DIAGNOSIS — D689 Coagulation defect, unspecified: Secondary | ICD-10-CM

## 2023-10-26 DIAGNOSIS — D6861 Antiphospholipid syndrome: Secondary | ICD-10-CM

## 2023-10-26 DIAGNOSIS — I6602 Occlusion and stenosis of left middle cerebral artery: Secondary | ICD-10-CM

## 2023-10-26 DIAGNOSIS — Z8673 Personal history of transient ischemic attack (TIA), and cerebral infarction without residual deficits: Secondary | ICD-10-CM

## 2023-10-26 DIAGNOSIS — E66811 Obesity, class 1: Secondary | ICD-10-CM

## 2023-10-26 LAB — C-REACTIVE PROTEIN: CRP: 0.7 mg/dL

## 2023-10-26 MED ORDER — GABAPENTIN 400 MG PO CAPS
400.0000 mg | ORAL_CAPSULE | Freq: Two times a day (BID) | ORAL | Status: DC
  Administered 2023-10-26: 400 mg via ORAL
  Filled 2023-10-26: qty 1

## 2023-10-26 MED ORDER — FOLIC ACID 1 MG PO TABS: 1.0000 mg | ORAL_TABLET | Freq: Every day | ORAL | 0 refills | Status: AC

## 2023-10-26 MED ORDER — APIXABAN 5 MG PO TABS: 5.0000 mg | ORAL_TABLET | Freq: Two times a day (BID) | ORAL | 0 refills | Status: DC

## 2023-10-26 MED ORDER — FERROUS SULFATE 325 (65 FE) MG PO TBEC: 325.0000 mg | DELAYED_RELEASE_TABLET | Freq: Every day | ORAL | Status: DC

## 2023-10-26 MED ORDER — FERROUS SULFATE 325 (65 FE) MG PO TABS
325.0000 mg | ORAL_TABLET | Freq: Every day | ORAL | Status: DC
  Filled 2023-10-26: qty 1

## 2023-10-26 MED ORDER — NAPROXEN 250 MG PO TABS
500.0000 mg | ORAL_TABLET | Freq: Two times a day (BID) | ORAL | Status: DC
  Administered 2023-10-26 (×2): 500 mg via ORAL
  Filled 2023-10-26 (×3): qty 2

## 2023-10-26 MED ORDER — SUMATRIPTAN SUCCINATE 50 MG PO TABS: 50.0000 mg | ORAL_TABLET | ORAL | 0 refills | Status: DC | PRN

## 2023-10-26 MED ORDER — CYANOCOBALAMIN 1000 MCG PO TABS: 1000.0000 ug | ORAL_TABLET | Freq: Every day | ORAL | Status: DC

## 2023-10-26 MED ORDER — PROPRANOLOL HCL 20 MG PO TABS: 20.0000 mg | ORAL_TABLET | Freq: Two times a day (BID) | ORAL | 0 refills | Status: DC

## 2023-10-26 MED ORDER — SUMATRIPTAN SUCCINATE 50 MG PO TABS
50.0000 mg | ORAL_TABLET | ORAL | Status: DC | PRN
  Administered 2023-10-26: 50 mg via ORAL
  Filled 2023-10-26 (×2): qty 1

## 2023-10-26 MED ORDER — GABAPENTIN 400 MG PO CAPS: ORAL_CAPSULE | ORAL | 0 refills | Status: DC

## 2023-10-26 MED ORDER — BUTALBITAL-APAP-CAFFEINE 50-325-40 MG PO TABS: 1.0000 | ORAL_TABLET | Freq: Four times a day (QID) | ORAL | 0 refills | Status: DC | PRN

## 2023-10-26 MED ORDER — GABAPENTIN 400 MG PO CAPS: 800.0000 mg | ORAL_CAPSULE | Freq: Every day | ORAL | Status: DC

## 2023-10-26 MED ORDER — SENNOSIDES-DOCUSATE SODIUM 8.6-50 MG PO TABS: 1.0000 | ORAL_TABLET | Freq: Two times a day (BID) | ORAL | Status: AC | PRN

## 2023-10-26 NOTE — Discharge Summary (Signed)
 Physician Discharge Summary  Jacqueline [name] FMW:[id number] DOB: [birth year] DOA: 10/17/2023  PCP: Pcp, No  Admit date: 10/17/2023 Discharge date: 10/26/23  Admitted From: Sister's home Disposition:  Family, Jacqueline [name]  Recommendations for Outpatient Follow-up:  Outpatient follow-up with PCP as soon as possible Encouraged to follow-up with her hematologist as soon as possible Outpatient follow-up with neurology for migraine headache Check CMP and CBC at follow-up Please follow up on the following pending results: None  Home Health: Ambulatory referral to neurorehab Equipment/Devices: No need identified  Discharge Condition: Stable CODE STATUS: Full code Diet Orders (From admission, onward)     Start     Ordered   10/23/23 1554  Diet Heart Room service appropriate? No; Fluid consistency: Thin  Diet effective now       Question Answer Comment  Room service appropriate? No   Fluid consistency: Thin      10/23/23 1554             Follow-up Information     [facility] Follow up.   Specialty: Rehabilitation Why: Please call the rehab center to arrange an appointment. Contact information: [address] [place]  [address] [phone]        [facility]. Schedule an appointment as soon as possible for a visit in 2 week(s).   Specialty: Neurology Contact information: [address] [place]  [address] [phone]        Jacqueline Emaline Brink, MD. Schedule an appointment as soon as possible for a visit in 1 week(s).   Specialties: Hematology, Oncology Contact information: [address] [place] [address] [phone]                 Hospital course 23 year old F with PMH of CVA with RUE paresis and aphasia, PE and lupus anticoagulant on Eliquis , migraine headache, abnormal uterine bleeding (menorrhagia), THC abuse,  thrombocytopenia and vitamin B12 deficiency presenting with heavy menstrual period (longer than usual) and intractable headache, and admitted with intractable migraine headache. MRI brain without acute finding but some chronic changes. Pelvic ultrasound showed 5.2 cm right ovarian cyst and normal appearance of endometrium. Patient has not had further bleeding in the hospital. Neurology consulted and she was tried on multiple medications for her migraine headache. However, she continues to complain of significant headache and bilateral lower extremity pain although she did not appear to be in that much distress.  She had no autonomic response to go along with the degree of pain she reports.  Initially, patient stated that she lived with her sister before coming to the hospital and she cannot return to live with her sister anymore.  However, family friend Jacqueline [name] visited patient on the day of discharge, and expressed that she will be taking over providing care and housing for patient upon discharge.   Patient discharged in stable condition.  Ambulatory referral to neurorehab ordered as recommended by therapy.  See individual problem list below for more.   Problems addressed during this hospitalization Intractable migraine/headache: Continues to endorse significant pain that she describes as throbbing.  Reports associated photophobia phonophobia.  No nausea or vomiting.  No new focal neurodeficit.  MRI brain without acute finding.  Patient is not a great historian partly due to expressive or receptive of patient.  Also question if her social circumstance is playing a role here.  She does not appear to be in much distress to go along with the amount  of pain she reports.  No autonomic response to go along either. -Discontinued oxycodone  and prednisone . -Fioricet  as needed.  Imitrex  50 mg as needed every 2 hours x 2 doses if no success with Fioricet  -Continue gabapentin  400 mg with BF and  lunch.  Increase night dose to 800 mg at night -Continue Inderal  20 mg twice daily -Ambulatory referral to neurology   Bilateral lower extremity pain: Some swelling in her left leg.  Also tenderness across anterior shin.  No erythema.  Denies apparent injury or trauma.  X-ray unrevealing.  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  No further bleeding since admission.  Pelvic US  showed 5.2 cm right ovarian cyst normal-appearing endometrium.  Anemia panel with severe iron  deficiency. - Received IV Venofer  300 mg on 8/23 - Discharged on p.o. ferrous sulfate , folic acid  and B12.   Recent pulmonary embolism/antiphospholipid syndrome: Previous provider discussed with Dr. Onesimo.  No longer following with their service due to multiple no-shows. -Continue Eliquis . -Encouraged to reestablish care with hematology   Thrombocytopenia, chronic: Stable   Right ovarian cyst - Repeat ultrasound recommended in 3 to 6 months   Hypokalemia/hypomagnesemia: Resolved   Medication noncompliance -Patient counseled on compliance   Constipation - Bowel regimen   Homelessness: Resolved.  See above.   Class I obesity Body mass index is 32.96 kg/m.           Consultations: Neurology  Time spent 35  minutes  Vital signs Vitals:   10/26/23 0724 10/26/23 0934 10/26/23 1224 10/26/23 1529  BP: 111/72 120/68 108/88 (!) 95/53  Pulse: 95 95 99 99  Temp: 99.1 F (37.3 C)   98.1 F (36.7 C)  Resp: 16  16 17   Height:      Weight:      SpO2: 97%  100% 98%  TempSrc: Oral   Oral  BMI (Calculated):         Discharge exam  GENERAL: No apparent distress.  Nontoxic. HEENT: MMM.  Vision and hearing grossly intact.  Photophobia? NECK: Supple.  No apparent JVD.  RESP:  No IWOB.  Fair aeration  bilaterally. CVS:  RRR. Heart sounds normal.  ABD/GI/GU: BS+. Abd soft, NTND.  MSK/EXT:  Moves extremities. No apparent deformity.  Tenderness in left shin but no swelling, lesion. SKIN: no apparent skin lesion or wound NEURO: AA.  Oriented x 4 except date.  Seems to have expressive and receptive aphasia.  RUE paresis with contracture. PSYCH: Calm. Normal affect.   Discharge Instructions Discharge Instructions     Ambulatory Referral to Neuro Rehab   Complete by: As directed    Ambulatory referral to Physical Therapy   Complete by: As directed    Discharge instructions   Complete by: As directed    It has been a pleasure taking care of you!  You were hospitalized due to anemia, headache and leg pain.  In regards to anemia, your hemoglobin is stable.  You received iron  infusion.  We are discharging you on oral iron , vitamin B12 and folic acid .  In regards to your migraine headache, you MRI did not show any new finding.  We have started you on medications.  Please take these medications as prescribed.  Maintain good hydration.  Regards to leg pain, your x-ray did not show specific problem to  address.  Use pain medications as needed.   Please call your hematologist, Dr. Onesimo to reestablish care as soon as possible.  Follow-up with your primary care doctor in 1 to 2 weeks or sooner if needed.  Follow-up with neurology per their recommendation.   Take care,   Increase activity slowly   Complete by: As directed       Allergies as of 10/26/2023   No Known Allergies      Medication List     STOP taking these medications    nystatin  cream Commonly known as: MYCOSTATIN        TAKE these medications    apixaban  5 MG Tabs tablet Commonly known as: ELIQUIS  Take 1 tablet (5 mg total) by mouth 2 (two) times daily. What changed: See the new instructions.   atorvastatin  20 MG tablet Commonly known as: LIPITOR Take 1 tablet (20 mg total) by mouth daily.    butalbital -acetaminophen -caffeine  50-325-40 MG tablet Commonly known as: FIORICET  Take 1 tablet by mouth every 6 (six) hours as needed for headache.   clonazePAM  0.5 MG tablet Commonly known as: KLONOPIN  Take 1 tablet (0.5 mg total) by mouth at bedtime.   cyanocobalamin  1000 MCG tablet Take 1 tablet (1,000 mcg total) by mouth daily. Start taking on: October 27, 2023   escitalopram  10 MG tablet Commonly known as: Lexapro  Take 0.5 tablets (5 mg total) by mouth daily for 7 days, THEN 1 tablet (10 mg total) daily. Start taking on: September 16, 2023   ferrous sulfate  325 (65 FE) MG EC tablet Take 1 tablet (325 mg total) by mouth daily with breakfast. Start taking on: October 27, 2023   folic acid  1 MG tablet Commonly known as: FOLVITE  Take 1 tablet (1 mg total) by mouth daily. Start taking on: October 27, 2023   gabapentin  400 MG capsule Commonly known as: NEURONTIN  Take 1 capsule (400 mg total) by mouth 2 (two) times daily with breakfast and lunch AND 2 capsules (800 mg total) at bedtime. What changed:  medication strength See the new instructions.   propranolol  20 MG tablet Commonly known as: INDERAL  Take 1 tablet (20 mg total) by mouth 2 (two) times daily.   senna-docusate 8.6-50 MG tablet Commonly known as: Senokot-S Take 1-2 tablets by mouth 2 (two) times daily between meals as needed for mild constipation or moderate constipation.   SUMAtriptan  50 MG tablet Commonly known as: IMITREX  Take 1 tablet (50 mg total) by mouth every 2 (two) hours as needed for migraine. May repeat in 2 hours if headache persists or recurs.         Procedures/Studies:   DG Tibia/Fibula Left Result Date: 10/25/2023 CLINICAL DATA:  Left lower extremity pain and swelling. EXAM: LEFT TIBIA AND FIBULA - 2 VIEW COMPARISON:  None Available. FINDINGS: There is no evidence of fracture or other focal bone lesions. Soft tissues are unremarkable. IMPRESSION: Negative. Electronically Signed   By: Vanetta Chou M.D.   On: 10/25/2023 12:42   US  PELVIS (TRANSABDOMINAL ONLY) Result Date: 10/22/2023 CLINICAL DATA:  Vaginal bleeding.  Unsure of last LMP. EXAM: TRANSABDOMINAL ULTRASOUND OF PELVIS TECHNIQUE: Transabdominal ultrasound examination of the pelvis was performed including evaluation of the uterus, ovaries, adnexal regions, and pelvic cul-de-sac. COMPARISON:  CT abdomen pelvis 09/10/2018 FINDINGS: Uterus Measurements: 10.0 x 3.2 x 5.0 cm = volume: 85 mL. No fibroids or other mass visualized. Endometrium Thickness: 5 mm.  No focal abnormality visualized. Right ovary Measurements: 7.5 x 5.9 x 6.4 cm = volume:  148 mL. 5.1 x 5.2 x 4.5 cm cyst without definite internal vascularity. Left ovary Measurements: 2.8 x 1.9 x 2.8 cm = volume: 7.7 mL. Normal appearance/no adnexal mass. Other findings:  No abnormal free fluid. IMPRESSION: 1. 5.2 cm right ovarian cyst. Recommend follow-up US  in 3-6 months. Note: This recommendation does not apply to premenarchal patients or to those with increased risk (genetic, family history, elevated tumor markers or other high-risk factors) of ovarian cancer. Reference: Radiology 2019 Nov; 293(2):359-371. 2. Normal appearance of the endometrium measuring 5 mm. If bleeding remains unresponsive to hormonal or medical therapy, sonohysterogram should be considered for focal lesion work-up. (Ref: Radiological Reasoning: Algorithmic Workup of Abnormal Vaginal Bleeding with Endovaginal Sonography and Sonohysterography. AJR 2008; 808:D31-26) Electronically Signed   By: Norman Gatlin M.D.   On: 10/22/2023 22:17   MR BRAIN WO CONTRAST Result Date: 10/18/2023 EXAM: MRI BRAIN WITHOUT CONTRAST 10/18/2023 11:04:34 AM TECHNIQUE: Multiplanar multisequence MRI of the head/brain was performed without the administration of intravenous contrast. COMPARISON: 09/24/2023 and 09/13/2023 CLINICAL HISTORY: Headache, increasing frequency or severity. FINDINGS: BRAIN AND VENTRICLES: Punctate foci of cortical  infarction are noted in the anterior right middle frontal lobe gyrus. Remote encephalomalacia with cortical laminar necrosis is present in the posterior left frontal and the parietal lobe. Mild ex vacuo dilation is noted in the posterior left lateral ventricle. Periventricular Ose matter changes are moderately advanced for age, similar to the prior study. No acute hemorrhage. No mass. No midline shift. ORBITS: No acute abnormality. SINUSES AND MASTOIDS: No acute abnormality. BONES AND SOFT TISSUES: Normal marrow signal. No acute soft tissue abnormality. IMPRESSION: 1. No acute intracranial abnormality related to the clinical history of headache. 2. Punctate foci of cortical infarction in the anterior right middle frontal lobe gyrus. 3. Remote encephalomalacia with cortical laminar necrosis in the posterior left frontal and parietal lobe. 4. Mild ex vacuo dilation in the posterior left lateral ventricle. 5. Moderately advanced periventricular Dietrick matter changes for age, similar to the prior study. Electronically signed by: Lonni Necessary MD 10/18/2023 11:13 AM EDT RP Workstation: [id number]       The results of significant diagnostics from this hospitalization (including imaging, microbiology, ancillary and laboratory) are listed below for reference.     Microbiology: No results found for this or any previous visit (from the past 240 hours).   Labs:  CBC: Recent Labs  Lab 10/20/23 0504 10/21/23 1008 10/22/23 0336 10/23/23 0328 10/25/23 0943  WBC 3.8* 7.4 8.2 5.4 6.2  HGB 8.6* 10.0* 9.9* 9.5* 10.2*  HCT 25.2* 28.6* 28.0* 27.6* 29.6*  MCV 81.3 79.4* 78.7* 79.3* 78.7*  PLT 48* 66* 60* 58* 70*   BMP &GFR Recent Labs  Lab 10/20/23 0504 10/21/23 1008 10/22/23 0336 10/23/23 0328 10/25/23 0943  NA 140 134* 136 138 134*  K 3.6 4.0 3.3* 3.9 4.1  CL 114* 108 109 108 105  CO2 19* 19* 18* 20* 21*  GLUCOSE 91 161* 144* 89 130*  BUN 8 11 9 12 11   CREATININE 0.97 0.88 0.84 0.97 0.76   CALCIUM  8.1* 8.7* 8.8* 8.1* 8.8*  MG 2.0 1.7 1.6* 1.9 1.8   Estimated Creatinine Clearance: 114.9 mL/min (by C-G formula based on SCr of 0.76 mg/dL). Liver & Pancreas: Recent Labs  Lab 10/25/23 0943  AST 18  ALT 10  ALKPHOS 66  BILITOT <0.2  PROT 6.7  ALBUMIN 3.4*   No results for input(s): LIPASE, AMYLASE in the last 168 hours. No results for input(s): AMMONIA in the last  168 hours. Diabetic: No results for input(s): HGBA1C in the last 72 hours. No results for input(s): GLUCAP in the last 168 hours. Cardiac Enzymes: Recent Labs  Lab 10/25/23 0943  CKTOTAL 44   No results for input(s): PROBNP in the last 8760 hours. Coagulation Profile: No results for input(s): INR, PROTIME in the last 168 hours. Thyroid Function Tests: No results for input(s): TSH, T4TOTAL, FREET4, T3FREE, THYROIDAB in the last 72 hours. Lipid Profile: No results for input(s): CHOL, HDL, LDLCALC, TRIG, CHOLHDL, LDLDIRECT in the last 72 hours. Anemia Panel: Recent Labs    10/25/23 1203  VITAMINB12 403  FOLATE 5.5*  FERRITIN 7*  TIBC 378  IRON  20*  RETICCTPCT 1.4   Urine analysis:    Component Value Date/Time   COLORURINE YELLOW 10/18/2023 0051   APPEARANCEUR HAZY (A) 10/18/2023 0051   LABSPEC 1.008 10/18/2023 0051   PHURINE 7.0 10/18/2023 0051   GLUCOSEU NEGATIVE 10/18/2023 0051   HGBUR NEGATIVE 10/18/2023 0051   BILIRUBINUR NEGATIVE 10/18/2023 0051   BILIRUBINUR negative 10/11/2023 1807   KETONESUR NEGATIVE 10/18/2023 0051   PROTEINUR NEGATIVE 10/18/2023 0051   UROBILINOGEN 0.2 10/11/2023 1807   UROBILINOGEN 1.0 05/08/2021 1944   NITRITE NEGATIVE 10/18/2023 0051   LEUKOCYTESUR SMALL (A) 10/18/2023 0051   Sepsis Labs: Invalid input(s): PROCALCITONIN, LACTICIDVEN   SIGNED:  Carrin Vannostrand T Laquanda Bick, MD  [facility] 10/26/2023, 4:05 PM

## 2023-10-26 NOTE — Progress Notes (Signed)
 PROGRESS NOTE  Jacqueline [name] FMW:[id number] DOB: 2000-06-05   PCP: Pcp, No  Patient is from: Homeless.  Lived with sister before coming to hospital.   DOA: 10/17/2023 LOS: 8  Chief complaints No chief complaint on file.    Brief Narrative / Interim history: 23 year old F with PMH of CVA with RUE paresis and aphasia, PE and lupus anticoagulant on Eliquis , migraine headache, abnormal uterine bleeding (menorrhagia), THC abuse, thrombocytopenia and vitamin B12 deficiency presenting with heavy menstrual period (longer than usual) and intractable headache, and admitted with intractable migraine headache.  MRI brain without acute finding but some chronic changes.  Pelvic ultrasound showed 5.2 cm right ovarian cyst and normal appearance of endometrium.  Patient has not had further bleeding in the hospital.  Neurology consulted and she was tried on multiple medications for her migraine headache.  However, she continues to complain of significant headache.  Also concerned about safe disposition since she is homeless and she cannot return to live with her sister.   Subjective: Seen and examined earlier this morning.  No major events overnight or this morning.  She reports sleep last night due to bilateral leg pain and headache.  She attributes this to her discontinued medication.  We stopped oxycodone  yesterday.  She noted some improvement with injectable sumatriptan  yesterday.  We had a lengthy discussion about management of migraine headache and pain.  Will try NSAID and adjusting gabapentin  today.  We have started Inderal  for migraine prophylaxis last night.  Objective: Vitals:   10/26/23 0000 10/26/23 0441 10/26/23 0724 10/26/23 0934  BP: 125/85 127/83 111/72 120/68  Pulse: 85 92 95 95  Resp: 18 18 16    Temp: 98.7 F (37.1 C) 99.5 F (37.5 C) 99.1 F (37.3 C)   TempSrc: Oral Oral Oral   SpO2: 100% 98% 97%   Weight:      Height:        Examination:  GENERAL: No apparent distress.   Nontoxic. HEENT: MMM.  Vision and hearing grossly intact.  Photophobia? NECK: Supple.  No apparent JVD.  RESP:  No IWOB.  Fair aeration bilaterally. CVS:  RRR. Heart sounds normal.  ABD/GI/GU: BS+. Abd soft, NTND.  MSK/EXT:  Moves extremities. No apparent deformity.  Tenderness in left shin. SKIN: no apparent skin lesion or wound NEURO: AA.  Oriented x 4 except date.  Seems to have expressive and receptive aphasia.  RUE paresis with contracture. PSYCH: Calm. Normal affect.   Consultants:  Neurology  Procedures: None  Microbiology summarized: None  Assessment and plan: Intractable migraine/headache: Continues to endorse significant pain that she describes as throbbing.  Reports associated photophobia phonophobia.  No nausea or vomiting.  No new focal neurodeficit.  MRI brain without acute finding.  Patient is not a great historian partly due to expressive or receptive of patient.  Also question if her social circumstance is playing a role here.  She does not appear to be in much distress to go along with the amount of pain she reports. -Start naproxen  500 mg twice daily -Continue Fioricet  as needed -Continue gabapentin  400 mg with BF and lunch.  Increase night dose to 800 mg at night -Continue Inderal  20 mg twice daily -Continue p.o. sumatriptan  as needed -Avoid opiates  Bilateral lower extremity pain: Some swelling in her left leg.  Also tenderness across anterior shin.  No erythema.  Denies apparent injury or trauma.  X-ray unrevealing.  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  No further bleeding since admission.  Pelvic US  showed 5.2 cm right ovarian cyst normal-appearing endometrium.  Anemia panel with severe iron  deficiency. Recent Labs    09/17/23 2158 09/17/23 2228 09/24/23 0216  10/18/23 0001 10/19/23 0114 10/20/23 0504 10/21/23 1008 10/22/23 0336 10/23/23 0328 10/25/23 0943  HGB 12.9 12.9 12.2 10.6* 9.4* 8.6* 10.0* 9.9* 9.5* 10.2*  - Received IV Venofer  300 mg on 8/23 - Will start p.o. iron  on 8/25  Recent pulmonary embolism/antiphospholipid syndrome: Previous provider discussed with Dr. Onesimo.  No longer following with their service due to multiple no-shows. -Continue Eliquis . -She will need to reestablish care with hematology  Thrombocytopenia, chronic: Stable  Right ovarian cyst - Repeat ultrasound recommended in 3 to 6 months  Hypokalemia/hypomagnesemia -Monitor replenish K and Mg as appropriate   Medication noncompliance -Patient counseled on compliance  Constipation - Bowel regimen   Homelessness: She says she lives with her sister before this hospitalization.  Now she cannot go back to stay with his sister.  She has complex medical issues.  Social worker coordinating with her family.    Class I obesity Body mass index is 32.96 kg/m.          DVT prophylaxis:   apixaban  (ELIQUIS ) tablet 5 mg  Code Status: Full code Family Communication: None at bedside Level of care: Med-Surg Status is: Inpatient Remains inpatient appropriate because: Intractable migraine headache and lack of safe disposition   Final disposition: To be determined   55 minutes with more than 50% spent in reviewing records, counseling patient/family and coordinating care.   Sch Meds:  Scheduled Meds:  apixaban   5 mg Oral BID   atorvastatin   20 mg Oral Daily   vitamin B-12  1,000 mcg Oral Daily   escitalopram   10 mg Oral Daily   [START ON 10/27/2023] ferrous sulfate   325 mg Oral Q breakfast   folic acid   1 mg Oral Daily   gabapentin   400 mg Oral BID WC   And   gabapentin   800 mg Oral QHS   naproxen   500 mg Oral BID WC   propranolol   20 mg Oral BID   Continuous Infusions: PRN Meds:.ALPRAZolam , butalbital -acetaminophen -caffeine , glucagon  (human  recombinant), ipratropium-albuterol , Muscle Rub, ondansetron  (ZOFRAN ) IV, mouth rinse, [EXPIRED] polyethylene glycol **FOLLOWED BY** polyethylene glycol, promethazine , [COMPLETED] senna-docusate **FOLLOWED BY** senna-docusate, SUMAtriptan   Antimicrobials: Anti-infectives (From admission, onward)    None        I have personally reviewed the following labs and images: CBC: Recent Labs  Lab 10/20/23 0504 10/21/23 1008 10/22/23 0336 10/23/23 0328 10/25/23 0943  WBC 3.8* 7.4 8.2 5.4 6.2  HGB 8.6* 10.0* 9.9* 9.5* 10.2*  HCT 25.2* 28.6* 28.0* 27.6* 29.6*  MCV 81.3 79.4* 78.7* 79.3* 78.7*  PLT 48* 66* 60* 58* 70*   BMP &GFR Recent Labs  Lab 10/20/23 0504 10/21/23 1008 10/22/23 0336 10/23/23 0328 10/25/23 0943  NA 140 134* 136 138 134*  K 3.6 4.0 3.3* 3.9 4.1  CL 114* 108 109 108 105  CO2 19* 19* 18* 20* 21*  GLUCOSE 91 161* 144* 89 130*  BUN 8 11 9 12 11   CREATININE 0.97 0.88 0.84 0.97 0.76  CALCIUM  8.1* 8.7* 8.8* 8.1* 8.8*  MG 2.0 1.7 1.6* 1.9 1.8   Estimated Creatinine Clearance: 114.9 mL/min (by C-G formula based on SCr of 0.76 mg/dL). Liver & Pancreas:  Recent Labs  Lab 10/25/23 0943  AST 18  ALT 10  ALKPHOS 66  BILITOT <0.2  PROT 6.7  ALBUMIN 3.4*   No results for input(s): LIPASE, AMYLASE in the last 168 hours. No results for input(s): AMMONIA in the last 168 hours. Diabetic: No results for input(s): HGBA1C in the last 72 hours. No results for input(s): GLUCAP in the last 168 hours. Cardiac Enzymes: Recent Labs  Lab 10/25/23 0943  CKTOTAL 44   No results for input(s): PROBNP in the last 8760 hours. Coagulation Profile: No results for input(s): INR, PROTIME in the last 168 hours. Thyroid Function Tests: No results for input(s): TSH, T4TOTAL, FREET4, T3FREE, THYROIDAB in the last 72 hours. Lipid Profile: No results for input(s): CHOL, HDL, LDLCALC, TRIG, CHOLHDL, LDLDIRECT in the last 72 hours. Anemia  Panel: Recent Labs    10/25/23 1203  VITAMINB12 403  FOLATE 5.5*  FERRITIN 7*  TIBC 378  IRON  20*  RETICCTPCT 1.4   Urine analysis:    Component Value Date/Time   COLORURINE YELLOW 10/18/2023 0051   APPEARANCEUR HAZY (A) 10/18/2023 0051   LABSPEC 1.008 10/18/2023 0051   PHURINE 7.0 10/18/2023 0051   GLUCOSEU NEGATIVE 10/18/2023 0051   HGBUR NEGATIVE 10/18/2023 0051   BILIRUBINUR NEGATIVE 10/18/2023 0051   BILIRUBINUR negative 10/11/2023 1807   KETONESUR NEGATIVE 10/18/2023 0051   PROTEINUR NEGATIVE 10/18/2023 0051   UROBILINOGEN 0.2 10/11/2023 1807   UROBILINOGEN 1.0 05/08/2021 1944   NITRITE NEGATIVE 10/18/2023 0051   LEUKOCYTESUR SMALL (A) 10/18/2023 0051   Sepsis Labs: Invalid input(s): PROCALCITONIN, LACTICIDVEN  Microbiology: No results found for this or any previous visit (from the past 240 hours).  Radiology Studies: No results found.    Goodwin Kamphaus T. Nobie Alleyne [facility]  If 7PM-7AM, please contact night-coverage [email or web address] 10/26/2023, 10:14 AM

## 2023-10-26 NOTE — TOC CM/SW Note (Signed)
 Per Lauraine, RN, pt has a family friend that can provide care and housing for pt upon DC. Pt's friend, [name], phone # [phone]. Spoke to [place] and she confirmed pt can stay with her at time of DC. Discussed PT recommendations for outpt rehab. Calandria reports that she can take pt to the rehab center. Referral sent to [facility]. Notified Dr. Gonfa of the DC plan.   Calandria's address: [address] [place], [place], [place] [address]

## 2023-10-26 NOTE — Progress Notes (Signed)
 Pt got Venofer  300mg  IV x1 8/24 for iron  deficiency anemia. Ok to start PO ferrous sulfate  in AM per Dr. Kathrin.   Sergio Batch, PharmD, BCIDP, AAHIVP, CPP Infectious Disease Pharmacist 10/26/2023 10:12 AM

## 2023-10-30 ENCOUNTER — Emergency Department (HOSPITAL_COMMUNITY)
Admission: EM | Admit: 2023-10-30 | Discharge: 2023-10-31 | Disposition: A | Discharge status: Home / Self Care | Payer: Self-pay | Attending: Emergency Medicine | Admitting: Emergency Medicine

## 2023-10-30 ENCOUNTER — Emergency Department (HOSPITAL_COMMUNITY): Payer: Self-pay

## 2023-10-30 ENCOUNTER — Other Ambulatory Visit: Payer: Self-pay

## 2023-10-30 ENCOUNTER — Encounter (HOSPITAL_COMMUNITY): Payer: Self-pay

## 2023-10-30 DIAGNOSIS — R079 Chest pain, unspecified: Secondary | ICD-10-CM | POA: Insufficient documentation

## 2023-10-30 DIAGNOSIS — R519 Headache, unspecified: Secondary | ICD-10-CM | POA: Diagnosis not present

## 2023-10-30 DIAGNOSIS — R000 Tachycardia, unspecified: Secondary | ICD-10-CM | POA: Diagnosis not present

## 2023-10-30 DIAGNOSIS — Z87891 Personal history of nicotine dependence: Secondary | ICD-10-CM | POA: Diagnosis not present

## 2023-10-30 DIAGNOSIS — Z7901 Long term (current) use of anticoagulants: Secondary | ICD-10-CM | POA: Insufficient documentation

## 2023-10-30 DIAGNOSIS — Z8673 Personal history of transient ischemic attack (TIA), and cerebral infarction without residual deficits: Secondary | ICD-10-CM | POA: Diagnosis not present

## 2023-10-30 LAB — CBC WITH DIFFERENTIAL/PLATELET
Abs Immature Granulocytes: 0.01 K/uL (ref 0.00–0.07)
Basophils Absolute: 0 K/uL (ref 0.0–0.1)
Basophils Relative: 1 %
Eosinophils Absolute: 0.1 K/uL (ref 0.0–0.5)
Eosinophils Relative: 1 %
HCT: 32.1 % — ABNORMAL LOW (ref 36.0–46.0)
Hemoglobin: 10.9 g/dL — ABNORMAL LOW (ref 12.0–15.0)
Immature Granulocytes: 0 %
Lymphocytes Relative: 32 %
Lymphs Abs: 1.9 K/uL (ref 0.7–4.0)
MCH: 26.6 pg (ref 26.0–34.0)
MCHC: 34 g/dL (ref 30.0–36.0)
MCV: 78.3 fL — ABNORMAL LOW (ref 80.0–100.0)
Monocytes Absolute: 0.7 K/uL (ref 0.1–1.0)
Monocytes Relative: 12 %
Neutro Abs: 3.2 K/uL (ref 1.7–7.7)
Neutrophils Relative %: 54 %
Platelets: 92 K/uL — ABNORMAL LOW (ref 150–400)
RBC: 4.1 MIL/uL (ref 3.87–5.11)
RDW: 14.1 % (ref 11.5–15.5)
WBC: 6 K/uL (ref 4.0–10.5)
nRBC: 0 % (ref 0.0–0.2)

## 2023-10-30 LAB — TROPONIN I (HIGH SENSITIVITY)
Troponin I (High Sensitivity): 3 ng/L (ref <18)
Troponin I (High Sensitivity): 4 ng/L (ref <18)

## 2023-10-30 LAB — HCG, SERUM, QUALITATIVE: Preg, Serum: NEGATIVE

## 2023-10-30 MED ORDER — MAGNESIUM SULFATE 2 GM/50ML IV SOLN
2.0000 g | Freq: Once | INTRAVENOUS | Status: AC
Start: 2023-10-30 — End: 2023-10-31
  Administered 2023-10-31: 2 g via INTRAVENOUS
  Filled 2023-10-30: qty 50

## 2023-10-30 MED ORDER — PROCHLORPERAZINE EDISYLATE 10 MG/2ML IJ SOLN
10.0000 mg | Freq: Once | INTRAMUSCULAR | Status: AC
  Administered 2023-10-31: 10 mg via INTRAVENOUS
  Filled 2023-10-30: qty 2

## 2023-10-30 NOTE — ED Notes (Signed)
 Attempted for PIV, unsuccessful.

## 2023-10-30 NOTE — ED Triage Notes (Signed)
 Patient arrives c/o headache and chest pain, hx of migraines and states that this feels like when she has had migraines in the past. Patient denies N/V, photophobia.

## 2023-10-30 NOTE — ED Provider Triage Note (Signed)
 Emergency Medicine Provider Triage Evaluation Note  Jacqueline [name] , a 23 y.o. female  was evaluated in triage.  Pt complains of headache and chest pain as well as shortness of breath.  Patient states that she has a history of migraine and states that this 1 feels similar.  She states however that she does not normally have chest pain along with her migraines.  Denies nausea, vomiting, photophobia.  Patient does have a history of pulmonary embolism but has been compliant with her outpatient Eliquis  and she has not missed any doses.  Review of Systems  Positive: Headache, chest pain, shortness of breath Negative: Fever, chills, nausea, vomiting, abdominal pain, photophobia, neck stiffness  Physical Exam  BP 107/71   Pulse (!) 112   Temp 98.1 F (36.7 C)   Resp 16   LMP 10/11/2023 (Exact Date) Comment: negative HCG 09/09/23  SpO2 95%  Gen:   Awake, no distress   Resp:  Normal effort  MSK:   Moves extremities without difficulty  Other:    Medical Decision Making  Medically screening exam initiated at 7:00 PM.  Appropriate orders placed.  Jacqueline [name] was informed that the remainder of the evaluation will be completed by another provider, this initial triage assessment does not replace that evaluation, and the importance of remaining in the ED until their evaluation is complete.  Orders: CBC, troponin, pregnancy, urinalysis, chest x-ray, EKG   Janetta Terrall FALCON, PA-C 10/31/23 0022

## 2023-10-30 NOTE — ED Provider Notes (Signed)
 [facility] Provider Note   CSN: [id number] Arrival date & time: 10/30/23  8170     Patient presents with: Migraine and Chest Pain   Jacqueline [name] is a 23 y.o. female.  Patient with past medical history significant for antiphospholipid antibody syndrome, PE on Eliquis , recent CVA, migraines presents to the emergency department complaining of severe pain across the front of her forehead which is been ongoing for approximately 1 week.  She states that she does have associated photophobia with her headache but denies nausea and vomiting.  The patient states this feels similar to her previous migraines.  The patient also complains of left-sided chest pain worse with inspiration.  She feels as if her lung is hurting.  She has a history of a pulmonary embolism and is currently on Eliquis  for the same.  She denies aggravating or alleviating factors.  She endorses mild shortness of breath associated with the chest pain.  The patient has right-sided deficits from her previous stroke.    Migraine Associated symptoms include chest pain.  Chest Pain      Prior to Admission medications   Medication Sig Start Date End Date Taking? Authorizing Provider  apixaban  (ELIQUIS ) 5 MG TABS tablet Take 1 tablet (5 mg total) by mouth 2 (two) times daily. 10/26/23   Gonfa, Taye T, MD  atorvastatin  (LIPITOR) 20 MG tablet Take 1 tablet (20 mg total) by mouth daily. 09/16/23   Will Almarie MATSU, MD  butalbital -acetaminophen -caffeine  (FIORICET ) 50-325-40 MG tablet Take 1 tablet by mouth every 6 (six) hours as needed for headache. 10/26/23   Gonfa, Taye T, MD  clonazePAM  (KLONOPIN ) 0.5 MG tablet Take 1 tablet (0.5 mg total) by mouth at bedtime. 09/16/23   Will Almarie MATSU, MD  cyanocobalamin  1000 MCG tablet Take 1 tablet (1,000 mcg total) by mouth daily. 10/27/23   Gonfa, Taye T, MD  escitalopram  (LEXAPRO ) 10 MG tablet Take 0.5 tablets (5 mg total) by mouth daily for 7  days, THEN 1 tablet (10 mg total) daily. 09/16/23 11/22/23  Will Almarie MATSU, MD  ferrous sulfate  325 (65 FE) MG EC tablet Take 1 tablet (325 mg total) by mouth daily with breakfast. 10/27/23   Kathrin Mignon DASEN, MD  folic acid  (FOLVITE ) 1 MG tablet Take 1 tablet (1 mg total) by mouth daily. 10/27/23   Gonfa, Taye T, MD  gabapentin  (NEURONTIN ) 400 MG capsule Take 1 capsule (400 mg total) by mouth 2 (two) times daily with breakfast and lunch AND 2 capsules (800 mg total) at bedtime. 10/26/23   Gonfa, Taye T, MD  propranolol  (INDERAL ) 20 MG tablet Take 1 tablet (20 mg total) by mouth 2 (two) times daily. 10/26/23   Gonfa, Taye T, MD  senna-docusate (SENOKOT-S) 8.6-50 MG tablet Take 1-2 tablets by mouth 2 (two) times daily between meals as needed for mild constipation or moderate constipation. 10/26/23   Gonfa, Taye T, MD  SUMAtriptan  (IMITREX ) 50 MG tablet Take 1 tablet (50 mg total) by mouth every 2 (two) hours as needed for migraine. May repeat in 2 hours if headache persists or recurs. 10/26/23   Gonfa, Taye T, MD    Allergies: Patient has no known allergies.    Review of Systems  Cardiovascular:  Positive for chest pain.    Updated Vital Signs BP 118/66 (BP Location: Left Arm)   Pulse (!) 110   Temp 97.8 F (36.6 C) (Oral)   Resp 19   LMP 10/11/2023 (Exact Date)  Comment: negative HCG 09/09/23  SpO2 100%   Physical Exam Vitals and nursing note reviewed.  Constitutional:      General: She is not in acute distress.    Appearance: She is well-developed.  HENT:     Head: Normocephalic and atraumatic.  Eyes:     Conjunctiva/sclera: Conjunctivae normal.  Cardiovascular:     Rate and Rhythm: Regular rhythm. Tachycardia present.     Heart sounds: No murmur heard. Pulmonary:     Effort: Pulmonary effort is normal. No respiratory distress.     Breath sounds: Normal breath sounds.  Abdominal:     Palpations: Abdomen is soft.     Tenderness: There is no abdominal tenderness.  Musculoskeletal:         General: No swelling.     Cervical back: Neck supple.  Skin:    General: Skin is warm and dry.     Capillary Refill: Capillary refill takes less than 2 seconds.  Neurological:     Mental Status: She is alert and oriented to person, place, and time.     Comments: Right sided deficits consistent with patient's baseline post CVA  Psychiatric:        Mood and Affect: Mood normal.     (all labs ordered are listed, but only abnormal results are displayed) Labs Reviewed  CBC WITH DIFFERENTIAL/PLATELET - Abnormal; Notable for the following components:      Result Value   Hemoglobin 10.9 (*)    HCT 32.1 (*)    MCV 78.3 (*)    Platelets 92 (*)    All other components within normal limits  BASIC METABOLIC PANEL WITH GFR - Abnormal; Notable for the following components:   Sodium 134 (*)    Potassium 3.4 (*)    Glucose, Bld 110 (*)    Calcium  8.6 (*)    All other components within normal limits  I-STAT CHEM 8, ED - Abnormal; Notable for the following components:   Potassium 3.4 (*)    Glucose, Bld 105 (*)    Hemoglobin 10.5 (*)    HCT 31.0 (*)    All other components within normal limits  HCG, SERUM, QUALITATIVE  CBC WITH DIFFERENTIAL/PLATELET  URINALYSIS, ROUTINE W REFLEX MICROSCOPIC  TROPONIN I (HIGH SENSITIVITY)  TROPONIN I (HIGH SENSITIVITY)    EKG: None  Radiology: CT Angio Chest PE W and/or Wo Contrast Result Date: 10/31/2023 CLINICAL DATA:  Chest pain EXAM: CT ANGIOGRAPHY CHEST WITH CONTRAST TECHNIQUE: Multidetector CT imaging of the chest was performed using the standard protocol during bolus administration of intravenous contrast. Multiplanar CT image reconstructions and MIPs were obtained to evaluate the vascular anatomy. RADIATION DOSE REDUCTION: This exam was performed according to the departmental dose-optimization program which includes automated exposure control, adjustment of the mA and/or kV according to patient size and/or use of iterative reconstruction  technique. CONTRAST:  75mL OMNIPAQUE  IOHEXOL  350 MG/ML SOLN COMPARISON:  Chest x-ray from the previous day. FINDINGS: Cardiovascular: Thoracic aorta is within normal limits. No cardiac enlargement is seen. The pulmonary artery shows a normal branching pattern bilaterally. No filling defect to suggest pulmonary embolism is noted. Mediastinum/Nodes: Thoracic inlet is within normal limits. No hilar or mediastinal adenopathy is noted. The esophagus as visualized is within normal limits. Lungs/Pleura: Lungs are well aerated bilaterally. No focal infiltrate or effusion is noted. Upper Abdomen: Visualized upper abdomen is unremarkable. Musculoskeletal: No acute rib abnormality is noted. No compression deformity is seen. Review of the MIP images confirms the above findings. IMPRESSION:  No evidence of pulmonary emboli.  No acute abnormality noted. Electronically Signed   By: Oneil Devonshire M.D.   On: 10/31/2023 03:33   CT Head Wo Contrast Result Date: 10/31/2023 CLINICAL DATA:  Increasing headaches EXAM: CT HEAD WITHOUT CONTRAST TECHNIQUE: Contiguous axial images were obtained from the base of the skull through the vertex without intravenous contrast. RADIATION DOSE REDUCTION: This exam was performed according to the departmental dose-optimization program which includes automated exposure control, adjustment of the mA and/or kV according to patient size and/or use of iterative reconstruction technique. COMPARISON:  09/24/2023 FINDINGS: Brain: Chronic left MCA infarct is again identified and stable. No acute hemorrhage, acute infarction or space-occupying mass lesion is noted. Left frontal infarct is also seen and stable. Vascular: No hyperdense vessel or unexpected calcification. Skull: Normal. Negative for fracture or focal lesion. Sinuses/Orbits: No acute finding. Other: None. IMPRESSION: Chronic infarcts on the left.  No acute abnormality noted. Electronically Signed   By: Oneil Devonshire M.D.   On: 10/31/2023 03:29   DG  Chest 2 View Result Date: 10/30/2023 CLINICAL DATA:  Chest pain.  Headache. EXAM: CHEST - 2 VIEW COMPARISON:  09/10/2023 FINDINGS: The heart size and mediastinal contours are within normal limits. Both lungs are clear. The visualized skeletal structures are unremarkable. IMPRESSION: No active cardiopulmonary disease. Electronically Signed   By: Elsie Gravely M.D.   On: 10/30/2023 20:30     Procedures   Medications Ordered in the ED  prochlorperazine  (COMPAZINE ) injection 10 mg (10 mg Intravenous Given 10/31/23 0050)  magnesium  sulfate IVPB 2 g 50 mL (0 g Intravenous Stopped 10/31/23 0308)  iohexol  (OMNIPAQUE ) 350 MG/ML injection 75 mL (75 mLs Intravenous Contrast Given 10/31/23 0320)                                    Medical Decision Making Amount and/or Complexity of Data Reviewed Labs: ordered. Radiology: ordered.  Risk Prescription drug management.   This patient presents to the ED for concern of headache and chest pain, this involves an extensive number of treatment options, and is a complaint that carries with it a high risk of complications and morbidity.  The differential diagnosis includes CVA, migraine, tension headache, others.  Differential for the chest pain includes worsening or new PE, ACS, pneumonia, others   Co morbidities / Chronic conditions that complicate the patient evaluation  Known PE, history of CVA, antiphospholipid antibody syndrome   Additional history obtained:  Additional history obtained from EMR External records from outside source obtained and reviewed including recent discharge summary   Lab Tests:  I Ordered, and personally interpreted labs.  The pertinent results include: Initial troponin 3, repeat 4; grossly unremarkable CBC and BMP   Imaging Studies ordered:  I ordered imaging studies including chest x-ray, CT angio chest PE study, CT head I independently visualized and interpreted imaging which showed No evidence of pulmonary  emboli.  No acute abnormality noted on CT chest.  Head CT with chronic infarcts but no acute findings I agree with the radiologist interpretation   Cardiac Monitoring: / EKG:  The patient was maintained on a cardiac monitor.  I personally viewed and interpreted the cardiac monitored which showed an underlying rhythm of: Sinus rhythm   Problem List / ED Course / Critical interventions / Medication management   I ordered medication including Compazine , magnesium  Reevaluation of the patient after these medicines showed that the patient improved  I have reviewed the patients home medicines and have made adjustments as needed   Social Determinants of Health:  Patient is a former smoker   Test / Admission - Considered:  Patient with negative troponins x 2 and a nonischemic EKG.  Not consistent with ACS.  No PE on PE study.  No pneumonia noted.  No obvious cause of patient's chest pain.  No life-threatening cause noted.  Patient also complained of headache consistent with her previous migraines.  Head CT unremarkable.  No acute stroke noted.  Patient with no new deficits.  Patient treated with headache cocktail with some improvement in symptoms.  At this time patient is requesting discharge home.   I see no indication for further emergent workup or admission.  Patient to follow-up with primary care team for further management as needed.  Return precautions provided.      Final diagnoses:  Chest pain, unspecified type  Bad headache    ED Discharge Orders     None          Logan Ubaldo KATHEE DEVONNA 10/31/23 0340    Bari Charmaine FALCON, MD 11/01/23 [phone]

## 2023-10-31 ENCOUNTER — Emergency Department (HOSPITAL_COMMUNITY): Payer: Self-pay

## 2023-10-31 LAB — BASIC METABOLIC PANEL WITH GFR
Anion gap: 8 (ref 5–15)
BUN: 13 mg/dL (ref 6–20)
CO2: 22 mmol/L (ref 22–32)
Calcium: 8.6 mg/dL — ABNORMAL LOW (ref 8.9–10.3)
Chloride: 104 mmol/L (ref 98–111)
Creatinine, Ser: 0.9 mg/dL (ref 0.44–1.00)
GFR, Estimated: >60 mL/min (ref >60)
Glucose, Bld: 110 mg/dL — ABNORMAL HIGH (ref 70–99)
Potassium: 3.4 mmol/L — ABNORMAL LOW (ref 3.5–5.1)
Sodium: 134 mmol/L — ABNORMAL LOW (ref 135–145)

## 2023-10-31 LAB — I-STAT CHEM 8, ED
BUN: 15 mg/dL (ref 6–20)
Calcium, Ion: 1.2 mmol/L (ref 1.15–1.40)
Chloride: 105 mmol/L (ref 98–111)
Creatinine, Ser: 0.9 mg/dL (ref 0.44–1.00)
Glucose, Bld: 105 mg/dL — ABNORMAL HIGH (ref 70–99)
HCT: 31 % — ABNORMAL LOW (ref 36.0–46.0)
Hemoglobin: 10.5 g/dL — ABNORMAL LOW (ref 12.0–15.0)
Potassium: 3.4 mmol/L — ABNORMAL LOW (ref 3.5–5.1)
Sodium: 139 mmol/L (ref 135–145)
TCO2: 23 mmol/L (ref 22–32)

## 2023-10-31 MED ORDER — IOHEXOL 350 MG/ML SOLN
75.0000 mL | Freq: Once | INTRAVENOUS | Status: AC | PRN
  Administered 2023-10-31: 75 mL via INTRAVENOUS

## 2023-10-31 NOTE — ED Provider Notes (Signed)
 I provided a substantive portion of the care of this patient.  I personally made/approved the management plan for this patient and take responsibility for the patient management.      Patient with history of antiphospholipid syndrome, stroke, PE who presents with chest pain and headache.  Slightly tachycardic.  High risk even though she does take anticoagulants.  WIll need PE study.   Physical Exam  BP 129/72   Pulse (!) 112   Temp (!) 97.5 F (36.4 C) (Oral)   Resp 20   LMP 10/11/2023 (Exact Date) Comment: negative HCG 09/09/23  SpO2 95%   Physical Exam Awake, alert, no acute distress Contracture right upper extremity Procedures  .Ultrasound ED Peripheral IV (Provider)  Date/Time: 10/31/2023 2:14 AM  Performed by: Bari Charmaine FALCON, MD Authorized by: Bari Charmaine FALCON, MD   Procedure details:    Indications: multiple failed IV attempts     Skin Prep: chlorhexidine  gluconate     Location:  Right AC   Angiocath:  20 G   Bedside Ultrasound Guided: Yes     Images: not archived     Patient tolerated procedure without complications: Yes     Dressing applied: Yes     ED Course / MDM    Medical Decision Making Amount and/or Complexity of Data Reviewed Labs: ordered. Radiology: ordered.  Risk Prescription drug management.          Bari Charmaine FALCON, MD 10/31/23 [phone]

## 2023-10-31 NOTE — Discharge Instructions (Signed)
 Your workup this evening was reassuring.  If you develop further chest pain, shortness of breath or other life-threatening symptoms please return to the emergency department.

## 2023-10-31 NOTE — ED Notes (Signed)
 Pt refusing to keep vital signs equipment off.

## 2023-11-01 ENCOUNTER — Encounter: Payer: Self-pay | Admitting: Hematology

## 2023-11-18 ENCOUNTER — Other Ambulatory Visit: Payer: Self-pay

## 2023-11-18 ENCOUNTER — Encounter (HOSPITAL_COMMUNITY): Payer: Self-pay

## 2023-11-18 ENCOUNTER — Emergency Department (HOSPITAL_COMMUNITY)
Admission: EM | Admit: 2023-11-18 | Discharge: 2023-11-19 | Disposition: A | Discharge status: Home / Self Care | Attending: Emergency Medicine | Admitting: Emergency Medicine

## 2023-11-18 DIAGNOSIS — G43009 Migraine without aura, not intractable, without status migrainosus: Secondary | ICD-10-CM | POA: Insufficient documentation

## 2023-11-18 DIAGNOSIS — Z8673 Personal history of transient ischemic attack (TIA), and cerebral infarction without residual deficits: Secondary | ICD-10-CM | POA: Insufficient documentation

## 2023-11-18 DIAGNOSIS — R519 Headache, unspecified: Secondary | ICD-10-CM | POA: Diagnosis present

## 2023-11-18 DIAGNOSIS — D649 Anemia, unspecified: Secondary | ICD-10-CM | POA: Insufficient documentation

## 2023-11-18 DIAGNOSIS — M79604 Pain in right leg: Secondary | ICD-10-CM | POA: Insufficient documentation

## 2023-11-18 DIAGNOSIS — Z7901 Long term (current) use of anticoagulants: Secondary | ICD-10-CM | POA: Insufficient documentation

## 2023-11-18 DIAGNOSIS — I63512 Cerebral infarction due to unspecified occlusion or stenosis of left middle cerebral artery: Secondary | ICD-10-CM

## 2023-11-18 DIAGNOSIS — D696 Thrombocytopenia, unspecified: Secondary | ICD-10-CM | POA: Insufficient documentation

## 2023-11-18 LAB — COMPREHENSIVE METABOLIC PANEL WITH GFR
ALT: 12 U/L (ref 0–44)
AST: 20 U/L (ref 15–41)
Albumin: 4.2 g/dL (ref 3.5–5.0)
Alkaline Phosphatase: 95 U/L (ref 38–126)
Anion gap: 12 (ref 5–15)
BUN: 9 mg/dL (ref 6–20)
CO2: 22 mmol/L (ref 22–32)
Calcium: 9.2 mg/dL (ref 8.9–10.3)
Chloride: 108 mmol/L (ref 98–111)
Creatinine, Ser: 0.75 mg/dL (ref 0.44–1.00)
GFR, Estimated: >60 mL/min (ref >60)
Glucose, Bld: 80 mg/dL (ref 70–99)
Potassium: 3.6 mmol/L (ref 3.5–5.1)
Sodium: 142 mmol/L (ref 135–145)
Total Bilirubin: 0.3 mg/dL (ref 0.0–1.2)
Total Protein: 7.2 g/dL (ref 6.5–8.1)

## 2023-11-18 LAB — CBC WITH DIFFERENTIAL/PLATELET
Abs Immature Granulocytes: 0 K/uL (ref 0.00–0.07)
Basophils Absolute: 0 K/uL (ref 0.0–0.1)
Basophils Relative: 1 %
Eosinophils Absolute: 0 K/uL (ref 0.0–0.5)
Eosinophils Relative: 0 %
HCT: 29 % — ABNORMAL LOW (ref 36.0–46.0)
Hemoglobin: 9.4 g/dL — ABNORMAL LOW (ref 12.0–15.0)
Immature Granulocytes: 0 %
Lymphocytes Relative: 27 %
Lymphs Abs: 1.3 K/uL (ref 0.7–4.0)
MCH: 24.7 pg — ABNORMAL LOW (ref 26.0–34.0)
MCHC: 32.4 g/dL (ref 30.0–36.0)
MCV: 76.1 fL — ABNORMAL LOW (ref 80.0–100.0)
Monocytes Absolute: 0.5 K/uL (ref 0.1–1.0)
Monocytes Relative: 11 %
Neutro Abs: 3.1 K/uL (ref 1.7–7.7)
Neutrophils Relative %: 61 %
Platelets: 66 K/uL — ABNORMAL LOW (ref 150–400)
RBC: 3.81 MIL/uL — ABNORMAL LOW (ref 3.87–5.11)
RDW: 15 % (ref 11.5–15.5)
Smear Review: NORMAL
WBC: 5 K/uL (ref 4.0–10.5)
nRBC: 0 % (ref 0.0–0.2)

## 2023-11-18 LAB — HCG, QUANTITATIVE, PREGNANCY: hCG, Beta Chain, Quant, S: <1 m[IU]/mL (ref <5)

## 2023-11-18 MED ORDER — LACTATED RINGERS IV BOLUS: 1000.0000 mL | Freq: Once | INTRAVENOUS | Status: DC

## 2023-11-18 MED ORDER — DEXAMETHASONE SODIUM PHOSPHATE 10 MG/ML IJ SOLN: 10.0000 mg | Freq: Once | INTRAMUSCULAR | Status: DC

## 2023-11-18 MED ORDER — PROCHLORPERAZINE EDISYLATE 10 MG/2ML IJ SOLN: 10.0000 mg | Freq: Once | INTRAMUSCULAR | Status: DC

## 2023-11-18 MED ORDER — DIPHENHYDRAMINE HCL 50 MG/ML IJ SOLN: 25.0000 mg | Freq: Once | INTRAMUSCULAR | Status: DC

## 2023-11-18 MED ORDER — ONDANSETRON 4 MG PO TBDP
ORAL_TABLET | ORAL | Status: AC
  Filled 2023-11-18: qty 1

## 2023-11-18 NOTE — ED Provider Notes (Signed)
 [place] EMERGENCY DEPARTMENT AT [facility] Provider Note   CSN: [id number] Arrival date & time: 11/18/23  1736     Patient presents with: Headache   Jacqueline [name] is a 23 y.o. female.  Headache Associated symptoms: myalgias   Patient is otherwise a multiple symptoms and concerns for frontal headache, pulsatile similar to her previous migraines ongoing for 1 week.  This has been accompanied with 1 week of increased right leg pain, noting pain across her entire leg.    Previous medical history of THC abuse, acute ischemic left MCA stroke currently anticoagulated on Eliquis  with right-sided deficits, ileus, intractable migraines, PE, antiphospholipid syndrome.  Reports that she has missed 3 days of Eliquis  this past month.  However has since then faithfully taking medication.  Notes that different from her previous she has had mild vertigo symptoms when her headache has been more painful, particularly on exertion. Reports that she is not following with neurology secondary to not having a car, and transportation issues getting to appointment.     Prior to Admission medications   Medication Sig Start Date End Date Taking? Authorizing Provider  apixaban  (ELIQUIS ) 5 MG TABS tablet Take 1 tablet (5 mg total) by mouth 2 (two) times daily. 10/26/23   Gonfa, Taye T, MD  atorvastatin  (LIPITOR) 20 MG tablet Take 1 tablet (20 mg total) by mouth daily. 09/16/23   Will Almarie MATSU, MD  butalbital -acetaminophen -caffeine  (FIORICET ) 50-325-40 MG tablet Take 1 tablet by mouth every 6 (six) hours as needed for headache. 10/26/23   Gonfa, Taye T, MD  clonazePAM  (KLONOPIN ) 0.5 MG tablet Take 1 tablet (0.5 mg total) by mouth at bedtime. 09/16/23   Will Almarie MATSU, MD  cyanocobalamin  1000 MCG tablet Take 1 tablet (1,000 mcg total) by mouth daily. 10/27/23   Gonfa, Taye T, MD  escitalopram  (LEXAPRO ) 10 MG tablet Take 0.5 tablets (5 mg total) by mouth daily for 7 days, THEN 1 tablet (10  mg total) daily. 09/16/23 11/22/23  Will Almarie MATSU, MD  ferrous sulfate  325 (65 FE) MG EC tablet Take 1 tablet (325 mg total) by mouth daily with breakfast. 10/27/23   Kathrin Mignon DASEN, MD  folic acid  (FOLVITE ) 1 MG tablet Take 1 tablet (1 mg total) by mouth daily. 10/27/23   Gonfa, Taye T, MD  gabapentin  (NEURONTIN ) 400 MG capsule Take 1 capsule (400 mg total) by mouth 2 (two) times daily with breakfast and lunch AND 2 capsules (800 mg total) at bedtime. 10/26/23   Gonfa, Taye T, MD  propranolol  (INDERAL ) 20 MG tablet Take 1 tablet (20 mg total) by mouth 2 (two) times daily. 10/26/23   Gonfa, Taye T, MD  senna-docusate (SENOKOT-S) 8.6-50 MG tablet Take 1-2 tablets by mouth 2 (two) times daily between meals as needed for mild constipation or moderate constipation. 10/26/23   Gonfa, Taye T, MD  SUMAtriptan  (IMITREX ) 50 MG tablet Take 1 tablet (50 mg total) by mouth every 2 (two) hours as needed for migraine. May repeat in 2 hours if headache persists or recurs. 10/26/23   Gonfa, Taye T, MD    Allergies: Patient has no known allergies.    Review of Systems  Musculoskeletal:  Positive for myalgias.  Neurological:  Positive for headaches.  All other systems reviewed and are negative.   Updated Vital Signs BP 104/69   Pulse 90   Temp 98.4 F (36.9 C)   Resp 16   Ht 5' 4 (1.626 m)   Wt 86.2 kg  SpO2 100%   BMI 32.61 kg/m   Physical Exam Vitals and nursing note reviewed.  Constitutional:      General: She is not in acute distress.    Appearance: Normal appearance. She is well-developed. She is not ill-appearing or diaphoretic.  HENT:     Head: Normocephalic and atraumatic.  Eyes:     General: No scleral icterus.       Right eye: No discharge.        Left eye: No discharge.     Extraocular Movements: Extraocular movements intact.     Conjunctiva/sclera: Conjunctivae normal.     Pupils: Pupils are equal, round, and reactive to light.  Cardiovascular:     Rate and Rhythm: Normal rate  and regular rhythm.     Pulses: Normal pulses.     Heart sounds: Normal heart sounds. No murmur heard.    No friction rub. No gallop.  Pulmonary:     Effort: Pulmonary effort is normal. No respiratory distress.     Breath sounds: No stridor. No wheezing, rhonchi or rales.  Chest:     Chest wall: No tenderness.  Abdominal:     General: Abdomen is flat. There is no distension.     Palpations: Abdomen is soft.     Tenderness: There is no abdominal tenderness. There is no right CVA tenderness, left CVA tenderness, guarding or rebound.  Musculoskeletal:        General: Tenderness present. No swelling, deformity or signs of injury.     Cervical back: Normal range of motion. No rigidity.     Right lower leg: No edema.     Left lower leg: No edema.     Comments: Patient is known to have right-sided leg pain generally to palpation, right leg is colder when compared to left.  DP pulses and PT pulses are both found with Doppler ultrasound.  Skin:    General: Skin is warm and dry.     Capillary Refill: Capillary refill takes less than 2 seconds.     Findings: No bruising, erythema or lesion.  Neurological:     Mental Status: She is alert and oriented to person, place, and time. Mental status is at baseline.     GCS: GCS eye subscore is 4. GCS verbal subscore is 5. GCS motor subscore is 6.     Cranial Nerves: No dysarthria.     Comments: Noted to have right-sided deficits, at baseline  Psychiatric:        Mood and Affect: Mood normal.     (all labs ordered are listed, but only abnormal results are displayed) Labs Reviewed  CBC WITH DIFFERENTIAL/PLATELET - Abnormal; Notable for the following components:      Result Value   RBC 3.81 (*)    Hemoglobin 9.4 (*)    HCT 29.0 (*)    MCV 76.1 (*)    MCH 24.7 (*)    Platelets 66 (*)    All other components within normal limits  COMPREHENSIVE METABOLIC PANEL WITH GFR  HCG, QUANTITATIVE, PREGNANCY    EKG: None  Radiology: CT Head Wo  Contrast Result Date: 11/19/2023 CLINICAL DATA:  Headache, increasing frequency or severity Hx of MCA occlussion, missed 3 doses of eliquis , worsening headache. EXAM: CT HEAD WITHOUT CONTRAST TECHNIQUE: Contiguous axial images were obtained from the base of the skull through the vertex without intravenous contrast. RADIATION DOSE REDUCTION: This exam was performed according to the departmental dose-optimization program which includes automated exposure control, adjustment of  the mA and/or kV according to patient size and/or use of iterative reconstruction technique. COMPARISON:  CT head 10/31/2023 FINDINGS: Brain: Encephalomalacia centered along the left frontoparietal lobes and extend to the temporal and occipital lobes likely due to prior infarction. Patchy and confluent areas of decreased attenuation are noted throughout the deep and periventricular Collier matter of the cerebral hemispheres bilaterally, compatible with chronic microvascular ischemic disease. No evidence of large-territorial acute infarction. No parenchymal hemorrhage. No mass lesion. No extra-axial collection. No mass effect or midline shift. No hydrocephalus. Basilar cisterns are patent. Vascular: No hyperdense vessel. Skull: No acute fracture or focal lesion. Sinuses/Orbits: Paranasal sinuses and mastoid air cells are clear. The orbits are unremarkable. Other: None. IMPRESSION: No acute intracranial abnormality. Electronically Signed   By: Morgane  Naveau M.D.   On: 11/19/2023 00:31    Procedures   Medications Ordered in the ED  haloperidol  lactate (HALDOL ) injection 5 mg (5 mg Intramuscular Given 11/19/23 0029)  ketorolac  (TORADOL ) 15 MG/ML injection 15 mg (15 mg Intramuscular Given 11/19/23 0103)                               Medical Decision Making Amount and/or Complexity of Data Reviewed Labs: ordered. Radiology: ordered.  Risk Prescription drug management.   This patient is a 23 year old female who presents to the ED for  concern of headache and right leg pain ongoing for the last week.  Similar to her previous headaches, with right leg pain intermittent since her stroke in January recurrent today.   On physical exam, patient is in no acute distress, afebrile, alert and orient x 4, speaking in full sentences, nontachypneic, nontachycardic.  Notably does have right leg that is painful to touch generally.  No edema noted.  DP pulse and PT pulses are unable to be palpated but are found on Doppler ultrasound.  Right side deficits noted and at baseline.  LCTAB, no abdominal tenderness ovation, RRR, no murmur.  The patient's mentation, lab work returned and showed her to be near baseline.  CT head unremarkable.  Attending saw the patient and agreed with plan.  Provided Haldol  and Toradol  secondary to finding difficult to obtain midline access.  On reevaluation, patient notes resolution of symptoms and is going to go home at this time.  Will have her return for DVT ultrasound tomorrow as well as put a neurology referral for her to follow-up for her previous stroke and deficits.  Encouraged continuous use of Eliquis  without any missed doses.  Patient vital signs have remained stable throughout the course of patient's time in the ED. Low suspicion for any other emergent pathology at this time. I believe this patient is safe to be discharged. Provided strict return to ER precautions. Patient expressed agreement and understanding of plan. All questions were answered.  Differential diagnoses prior to evaluation: The emergent differential diagnosis includes, but is not limited to, tension headache, migraine, polypharmacy, substance abuse, sinusitis, cervicogenic headache, dehydration, cluster headache, trigeminal neuralgia, IIH, PRES syndrome, intracranial bleed, CVA. This is not an exhaustive differential.   Past Medical History / Co-morbidities / Social History: THC abuse, acute ischemic left MCA stroke currently anticoagulated on  Eliquis , ileus, intractable migraines, PE, antiphospholipid syndrome.  Additional history: Chart reviewed. Pertinent results include:   Reportedly seen in the ED 7 times in the last 6 months with 2 admissions.  Last seen in the ED on 10/30/2023 for chest pain and migraine.  Noted to  have migraine or frontal forehead swelling for approximately 1 week with photophobia. Also have some shortness of breath with associated chest pain.  Right side deficits noted after stroke.  PE study done at that time which was negative.  Head CT at time unremarkable.  Lab Tests/Imaging studies: I personally interpreted labs/imaging and the pertinent results include:   CBC notes anemia with hemoglobin 9.4, slightly decreased from previous noting, cytopenia with platelets 66, similar to previous CMP unremarkable hCG quant negative CT head unremarkable .   I agree with the radiologist interpretation.    Medications: I ordered medication including Haldol , Toradol .  I have reviewed the patients home medicines and have made adjustments as needed.  Critical Interventions: None  Social Determinants of Health: Has transportation issues finding a difficult to get to a neurology visit  Disposition: After consideration of the diagnostic results and the patients response to treatment, I feel that the patient would benefit from discharge and antibiotic.   emergency department workup does not suggest an emergent condition requiring admission or immediate intervention beyond what has been performed at this time. The plan is: Follow-up with neurology, return for DVT ultrasound, continue taking Eliquis , follow-up with PCP and return for new or worsening symptoms. The patient is safe for discharge and has been instructed to return immediately for worsening symptoms, change in symptoms or any other concerns.   Final diagnoses:  Right leg pain  Migraine without aura and without status migrainosus, not intractable  Acute  ischemic left MCA stroke [facility])    ED Discharge Orders          Ordered    [facility]        11/19/23 0150    Ambulatory referral to Neurology       Comments: An appointment is requested in approximately: 1 week   11/19/23 0152               Beola Terrall RAMAN, PA-C 11/19/23 0155    Theadore Ozell HERO, MD 11/19/23 [phone]

## 2023-11-18 NOTE — ED Triage Notes (Signed)
 Pt presents to ED from home C/O headache X 1 week and R leg pain X 2 days. Pt reports hx CVA with R sided deficits, on Eliquis .

## 2023-11-18 NOTE — ED Provider Notes (Incomplete)
 [name] Provider Note   CSN: [id number] Arrival date & time: 11/18/23  1736     Patient presents with: Headache   Jacqueline [name] is a 23 y.o. female.  Headache Associated symptoms: myalgias   Patient is otherwise a multiple symptoms and concerns for frontal headache, pulsatile similar to her previous migraines ongoing for 1 week.  This has been accompanied with 1 week of increased right leg pain, noting pain across her entire leg.    Previous medical history of THC abuse, acute ischemic left MCA stroke currently anticoagulated on Eliquis  with right-sided deficits, ileus, intractable migraines, PE, antiphospholipid syndrome.  Reports that she has missed 3 days of Eliquis  this past month.  However has since then faithfully taking medication.  Notes that different from her previous she has had mild vertigo symptoms when her headache has been more painful, particularly on exertion. Reports that she is not following with neurology secondary to not having a car, and transportation issues getting to appointment.     Prior to Admission medications   Medication Sig Start Date End Date Taking? Authorizing Provider  apixaban  (ELIQUIS ) 5 MG TABS tablet Take 1 tablet (5 mg total) by mouth 2 (two) times daily. 10/26/23   Gonfa, Taye T, MD  atorvastatin  (LIPITOR) 20 MG tablet Take 1 tablet (20 mg total) by mouth daily. 09/16/23   Will Almarie MATSU, MD  butalbital -acetaminophen -caffeine  (FIORICET ) 50-325-40 MG tablet Take 1 tablet by mouth every 6 (six) hours as needed for headache. 10/26/23   Gonfa, Taye T, MD  clonazePAM  (KLONOPIN ) 0.5 MG tablet Take 1 tablet (0.5 mg total) by mouth at bedtime. 09/16/23   Will Almarie MATSU, MD  cyanocobalamin  1000 MCG tablet Take 1 tablet (1,000 mcg total) by mouth daily. 10/27/23   Gonfa, Taye T, MD  escitalopram  (LEXAPRO ) 10 MG tablet Take 0.5 tablets (5 mg total) by mouth daily for 7 days, THEN 1 tablet (10  mg total) daily. 09/16/23 11/22/23  Will Almarie MATSU, MD  ferrous sulfate  325 (65 FE) MG EC tablet Take 1 tablet (325 mg total) by mouth daily with breakfast. 10/27/23   Kathrin Mignon DASEN, MD  folic acid  (FOLVITE ) 1 MG tablet Take 1 tablet (1 mg total) by mouth daily. 10/27/23   Gonfa, Taye T, MD  gabapentin  (NEURONTIN ) 400 MG capsule Take 1 capsule (400 mg total) by mouth 2 (two) times daily with breakfast and lunch AND 2 capsules (800 mg total) at bedtime. 10/26/23   Gonfa, Taye T, MD  propranolol  (INDERAL ) 20 MG tablet Take 1 tablet (20 mg total) by mouth 2 (two) times daily. 10/26/23   Gonfa, Taye T, MD  senna-docusate (SENOKOT-S) 8.6-50 MG tablet Take 1-2 tablets by mouth 2 (two) times daily between meals as needed for mild constipation or moderate constipation. 10/26/23   Gonfa, Taye T, MD  SUMAtriptan  (IMITREX ) 50 MG tablet Take 1 tablet (50 mg total) by mouth every 2 (two) hours as needed for migraine. May repeat in 2 hours if headache persists or recurs. 10/26/23   Gonfa, Taye T, MD    Allergies: Patient has no known allergies.    Review of Systems  Musculoskeletal:  Positive for myalgias.  Neurological:  Positive for headaches.  All other systems reviewed and are negative.   Updated Vital Signs BP 98/66   Pulse 99   Temp 98.4 F (36.9 C)   Resp 18   Ht 5' 4 (1.626 m)   Wt 86.2 kg  SpO2 100%   BMI 32.61 kg/m   Physical Exam Vitals and nursing note reviewed.  Constitutional:      General: She is not in acute distress.    Appearance: Normal appearance. She is well-developed. She is not ill-appearing or diaphoretic.  HENT:     Head: Normocephalic and atraumatic.  Eyes:     General: No scleral icterus.       Right eye: No discharge.        Left eye: No discharge.     Extraocular Movements: Extraocular movements intact.     Conjunctiva/sclera: Conjunctivae normal.     Pupils: Pupils are equal, round, and reactive to light.  Cardiovascular:     Rate and Rhythm: Normal rate  and regular rhythm.     Pulses: Normal pulses.     Heart sounds: Normal heart sounds. No murmur heard.    No friction rub. No gallop.  Pulmonary:     Effort: Pulmonary effort is normal. No respiratory distress.     Breath sounds: No stridor. No wheezing, rhonchi or rales.  Chest:     Chest wall: No tenderness.  Abdominal:     General: Abdomen is flat. There is no distension.     Palpations: Abdomen is soft.     Tenderness: There is no abdominal tenderness. There is no right CVA tenderness, left CVA tenderness, guarding or rebound.  Musculoskeletal:        General: Tenderness present. No swelling, deformity or signs of injury.     Cervical back: Normal range of motion. No rigidity.     Right lower leg: No edema.     Left lower leg: No edema.     Comments: Patient is known to have right-sided leg pain generally to palpation, right leg is colder when compared to left.  DP pulses and PT pulses are both found with Doppler ultrasound.  Skin:    General: Skin is warm and dry.     Capillary Refill: Capillary refill takes less than 2 seconds.     Findings: No bruising, erythema or lesion.  Neurological:     Mental Status: She is alert and oriented to person, place, and time. Mental status is at baseline.     GCS: GCS eye subscore is 4. GCS verbal subscore is 5. GCS motor subscore is 6.     Cranial Nerves: No dysarthria.     Comments: Noted to have right-sided deficits, at baseline  Psychiatric:        Mood and Affect: Mood normal.     (all labs ordered are listed, but only abnormal results are displayed) Labs Reviewed  CBC WITH DIFFERENTIAL/PLATELET - Abnormal; Notable for the following components:      Result Value   RBC 3.81 (*)    Hemoglobin 9.4 (*)    HCT 29.0 (*)    MCV 76.1 (*)    MCH 24.7 (*)    Platelets 66 (*)    All other components within normal limits  COMPREHENSIVE METABOLIC PANEL WITH GFR  HCG, QUANTITATIVE, PREGNANCY    EKG: None  Radiology: No results  found.  Procedures   Medications Ordered in the ED  ondansetron  (ZOFRAN -ODT) 4 MG disintegrating tablet (has no administration in time range)      {Click here for ABCD2, HEART and other calculators REFRESH Note before signing:1}  Medical Decision Making  This patient is a ***  who presents to the ED for concern of ***.   Differential diagnoses prior to evaluation: The emergent differential diagnosis includes, but is not limited to, tension headache, migraine, polypharmacy, substance abuse, sinusitis, cervicogenic headache, dehydration, cluster headache, trigeminal neuralgia, IIH, PRES syndrome, intracranial bleed, CVA. This is not an exhaustive differential.   Past Medical History / Co-morbidities / Social History: THC abuse, acute ischemic left MCA stroke currently anticoagulated on Eliquis , ileus, intractable migraines, PE, antiphospholipid syndrome.  Additional history: Chart reviewed. Pertinent results include:   Reportedly seen in the ED 7 times in the last 6 months with 2 admissions.  Last seen in the ED on 10/30/2023 for chest pain and migraine.  Noted to have migraine or frontal forehead swelling for approximately 1 week with photophobia. Also have some shortness of breath with associated chest pain.  Right side deficits noted after stroke.  PE study done at that time which was negative.  Head CT at time unremarkable.  Lab Tests/Imaging studies: I personally interpreted labs/imaging and the pertinent results include:  ***.   ***I agree with the radiologist interpretation.  Cardiac monitoring: EKG obtained and interpreted by myself and attending physician which shows: ***   Medications: I ordered medication including ***.  I have reviewed the patients home medicines and have made adjustments as needed.  Critical Interventions:  Social Determinants of Health:  Disposition: After consideration of the diagnostic results and the patients  response to treatment, I feel that the patient would benefit from ***.   ***emergency [name] workup does not suggest an emergent condition requiring admission or immediate intervention beyond what has been performed at this time. The plan is: ***. The patient is safe for discharge and has been instructed to return immediately for worsening symptoms, change in symptoms or any other concerns.   Final diagnoses:  None    ED Discharge Orders     None

## 2023-11-19 ENCOUNTER — Emergency Department (HOSPITAL_COMMUNITY)

## 2023-11-19 MED ORDER — HALOPERIDOL LACTATE 5 MG/ML IJ SOLN
5.0000 mg | Freq: Once | INTRAMUSCULAR | Status: AC
Start: 2023-11-19 — End: 2023-11-19
  Administered 2023-11-19: 5 mg via INTRAMUSCULAR
  Filled 2023-11-19: qty 1

## 2023-11-19 MED ORDER — KETOROLAC TROMETHAMINE 15 MG/ML IJ SOLN
15.0000 mg | Freq: Once | INTRAMUSCULAR | Status: AC
  Administered 2023-11-19: 15 mg via INTRAMUSCULAR
  Filled 2023-11-19: qty 1

## 2023-11-19 NOTE — Discharge Instructions (Addendum)
 You were seen today for headache as well as right leg pain.  Will recommend continue to follow-up tomorrow or sometime in the near future to [facility] for a ultrasound of your right leg to make sure that there is no clots there at this time.  Please be sure to continue to take your Eliquis  though make sure not to miss any doses.  I have also set up a neurology referral, they should reach out to you in the next 24 to 48 hours but if they do not, please contact the information that I provided here to ensure that you have a follow-up scheduled.  Return for any new or worsening symptoms otherwise.

## 2023-11-19 NOTE — ED Notes (Addendum)
 IV team states they were unable to obtain access.

## 2023-11-20 ENCOUNTER — Ambulatory Visit (HOSPITAL_COMMUNITY)
Admission: RE | Admit: 2023-11-20 | Discharge: 2023-11-20 | Disposition: A | Discharge status: Home / Self Care | Source: Ambulatory Visit | Attending: Emergency Medicine | Admitting: Emergency Medicine

## 2023-11-20 DIAGNOSIS — M79604 Pain in right leg: Secondary | ICD-10-CM | POA: Insufficient documentation

## 2023-11-24 ENCOUNTER — Emergency Department (HOSPITAL_COMMUNITY)
Admission: EM | Admit: 2023-11-24 | Discharge: 2023-11-25 | Disposition: A | Discharge status: Home / Self Care | Attending: Emergency Medicine | Admitting: Emergency Medicine

## 2023-11-24 ENCOUNTER — Other Ambulatory Visit: Payer: Self-pay

## 2023-11-24 ENCOUNTER — Emergency Department (HOSPITAL_COMMUNITY)

## 2023-11-24 DIAGNOSIS — Z8673 Personal history of transient ischemic attack (TIA), and cerebral infarction without residual deficits: Secondary | ICD-10-CM | POA: Insufficient documentation

## 2023-11-24 DIAGNOSIS — Z7901 Long term (current) use of anticoagulants: Secondary | ICD-10-CM | POA: Insufficient documentation

## 2023-11-24 DIAGNOSIS — M25561 Pain in right knee: Secondary | ICD-10-CM | POA: Diagnosis not present

## 2023-11-24 DIAGNOSIS — R519 Headache, unspecified: Secondary | ICD-10-CM | POA: Insufficient documentation

## 2023-11-24 DIAGNOSIS — M79605 Pain in left leg: Secondary | ICD-10-CM | POA: Insufficient documentation

## 2023-11-24 LAB — CBC WITH DIFFERENTIAL/PLATELET
Abs Immature Granulocytes: 0.01 K/uL (ref 0.00–0.07)
Basophils Absolute: 0 K/uL (ref 0.0–0.1)
Basophils Relative: 0 %
Eosinophils Absolute: 0 K/uL (ref 0.0–0.5)
Eosinophils Relative: 0 %
HCT: 30.3 % — ABNORMAL LOW (ref 36.0–46.0)
Hemoglobin: 9.7 g/dL — ABNORMAL LOW (ref 12.0–15.0)
Immature Granulocytes: 0 %
Lymphocytes Relative: 29 %
Lymphs Abs: 1.4 K/uL (ref 0.7–4.0)
MCH: 24.4 pg — ABNORMAL LOW (ref 26.0–34.0)
MCHC: 32 g/dL (ref 30.0–36.0)
MCV: 76.1 fL — ABNORMAL LOW (ref 80.0–100.0)
Monocytes Absolute: 0.5 K/uL (ref 0.1–1.0)
Monocytes Relative: 12 %
Neutro Abs: 2.7 K/uL (ref 1.7–7.7)
Neutrophils Relative %: 59 %
Platelets: 55 K/uL — ABNORMAL LOW (ref 150–400)
RBC: 3.98 MIL/uL (ref 3.87–5.11)
RDW: 15.7 % — ABNORMAL HIGH (ref 11.5–15.5)
WBC: 4.6 K/uL (ref 4.0–10.5)
nRBC: 0 % (ref 0.0–0.2)

## 2023-11-24 LAB — COMPREHENSIVE METABOLIC PANEL WITH GFR
ALT: 9 U/L (ref 0–44)
AST: 22 U/L (ref 15–41)
Albumin: 4.1 g/dL (ref 3.5–5.0)
Alkaline Phosphatase: 96 U/L (ref 38–126)
Anion gap: 12 (ref 5–15)
BUN: 13 mg/dL (ref 6–20)
CO2: 19 mmol/L — ABNORMAL LOW (ref 22–32)
Calcium: 8.9 mg/dL (ref 8.9–10.3)
Chloride: 107 mmol/L (ref 98–111)
Creatinine, Ser: 0.83 mg/dL (ref 0.44–1.00)
GFR, Estimated: >60 mL/min (ref >60)
Glucose, Bld: 87 mg/dL (ref 70–99)
Potassium: 4 mmol/L (ref 3.5–5.1)
Sodium: 138 mmol/L (ref 135–145)
Total Bilirubin: 0.4 mg/dL (ref 0.0–1.2)
Total Protein: 7.3 g/dL (ref 6.5–8.1)

## 2023-11-24 MED ORDER — BUTALBITAL-APAP-CAFFEINE 50-325-40 MG PO TABS: 1.0000 | ORAL_TABLET | Freq: Four times a day (QID) | ORAL | 0 refills | Status: DC | PRN

## 2023-11-24 MED ORDER — BUTALBITAL-APAP-CAFFEINE 50-325-40 MG PO TABS
1.0000 | ORAL_TABLET | Freq: Once | ORAL | Status: AC
Start: 2023-11-24 — End: 2023-11-24
  Administered 2023-11-24: 1 via ORAL
  Filled 2023-11-24: qty 1

## 2023-11-24 NOTE — Discharge Instructions (Signed)
 Follow-up for a family doctor with [name]

## 2023-11-24 NOTE — ED Provider Notes (Signed)
 [place] [facility] AT [facility] Provider Note   CSN: [id number] Arrival date & time: 11/24/23  8086     Patient presents with: Headache   Jacqueline [name] is a 23 y.o. female.  {Add pertinent medical, surgical, social history, OB history to YEP:67052} Patient has a history of a stroke and she has chronic headaches and bilateral leg pain.  She was admitted recently for evaluation of her headaches and was sent home with Fioricet    Headache      Prior to Admission medications   Medication Sig Start Date End Date Taking? Authorizing Provider  butalbital -acetaminophen -caffeine  (FIORICET ) 50-325-40 MG tablet Take 1-2 tablets by mouth every 6 (six) hours as needed for headache. 11/24/23 11/23/24 Yes Suzette Pac, MD  apixaban  (ELIQUIS ) 5 MG TABS tablet Take 1 tablet (5 mg total) by mouth 2 (two) times daily. 10/26/23   Gonfa, Taye T, MD  atorvastatin  (LIPITOR) 20 MG tablet Take 1 tablet (20 mg total) by mouth daily. 09/16/23   Will Almarie MATSU, MD  clonazePAM  (KLONOPIN ) 0.5 MG tablet Take 1 tablet (0.5 mg total) by mouth at bedtime. 09/16/23   Will Almarie MATSU, MD  cyanocobalamin  1000 MCG tablet Take 1 tablet (1,000 mcg total) by mouth daily. 10/27/23   Gonfa, Taye T, MD  escitalopram  (LEXAPRO ) 10 MG tablet Take 0.5 tablets (5 mg total) by mouth daily for 7 days, THEN 1 tablet (10 mg total) daily. 09/16/23 11/22/23  Will Almarie MATSU, MD  ferrous sulfate  325 (65 FE) MG EC tablet Take 1 tablet (325 mg total) by mouth daily with breakfast. 10/27/23   Kathrin Mignon DASEN, MD  folic acid  (FOLVITE ) 1 MG tablet Take 1 tablet (1 mg total) by mouth daily. 10/27/23   Gonfa, Taye T, MD  gabapentin  (NEURONTIN ) 400 MG capsule Take 1 capsule (400 mg total) by mouth 2 (two) times daily with breakfast and lunch AND 2 capsules (800 mg total) at bedtime. 10/26/23   Gonfa, Taye T, MD  propranolol  (INDERAL ) 20 MG tablet Take 1 tablet (20 mg total) by mouth 2 (two) times daily. 10/26/23    Gonfa, Taye T, MD  senna-docusate (SENOKOT-S) 8.6-50 MG tablet Take 1-2 tablets by mouth 2 (two) times daily between meals as needed for mild constipation or moderate constipation. 10/26/23   Gonfa, Taye T, MD  SUMAtriptan  (IMITREX ) 50 MG tablet Take 1 tablet (50 mg total) by mouth every 2 (two) hours as needed for migraine. May repeat in 2 hours if headache persists or recurs. 10/26/23   Gonfa, Taye T, MD    Allergies: Patient has no known allergies.    Review of Systems  Neurological:  Positive for headaches.    Updated Vital Signs BP 115/70   Pulse (!) 104   Temp 98.7 F (37.1 C) (Oral)   Resp 17   LMP 10/27/2023 (Within Months)   SpO2 100%   Physical Exam  (all labs ordered are listed, but only abnormal results are displayed) Labs Reviewed  CBC WITH DIFFERENTIAL/PLATELET - Abnormal; Notable for the following components:      Result Value   Hemoglobin 9.7 (*)    HCT 30.3 (*)    MCV 76.1 (*)    MCH 24.4 (*)    RDW 15.7 (*)    Platelets 55 (*)    All other components within normal limits  COMPREHENSIVE METABOLIC PANEL WITH GFR - Abnormal; Notable for the following components:   CO2 19 (*)    All other components within normal  limits    EKG: None  Radiology: CT Head Wo Contrast Result Date: 11/24/2023 CLINICAL DATA:  Headache, fever EXAM: CT HEAD WITHOUT CONTRAST TECHNIQUE: Contiguous axial images were obtained from the base of the skull through the vertex without intravenous contrast. RADIATION DOSE REDUCTION: This exam was performed according to the departmental dose-optimization program which includes automated exposure control, adjustment of the mA and/or kV according to patient size and/or use of iterative reconstruction technique. COMPARISON:  None Available. 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 This is unchanged. Areas of low-density again noted throughout the deep Helman matter. No acute infarct,  hemorrhage or hydrocephalus. Vascular: No hyperdense vessel or unexpected calcification. Skull: No acute calvarial abnormality. 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 No acute intracranial abnormality. Electronically Signed   By: Franky Crease M.D.   On: 11/24/2023 23:18   DG Tibia/Fibula Right Result Date: 11/24/2023 CLINICAL DATA:  Pain. Right knee pain since last week. No specific injury. Possibly related to left-sided weakness due to a stroke. EXAM: RIGHT TIBIA AND FIBULA - 2 VIEW COMPARISON:  None Available. FINDINGS: There is no evidence of fracture or other focal bone lesions. Soft tissues are unremarkable. IMPRESSION: Negative. Electronically Signed   By: Elsie Gravely M.D.   On: 11/24/2023 22:32    {Document cardiac monitor, telemetry assessment procedure when appropriate:32947} Procedures   Medications Ordered in the ED  butalbital -acetaminophen -caffeine  (FIORICET ) 50-325-40 MG per tablet 1 tablet (1 tablet Oral Given 11/24/23 2227)      {Click here for ABCD2, HEART and other calculators REFRESH Note before signing:1}                              Medical Decision Making Amount and/or Complexity of Data Reviewed Labs: ordered. Radiology: ordered.  Risk Prescription drug management.   Patient with chronic headaches and bilateral leg pain.  She is given Fioricet  again and will follow-up with a primary care doctor.  CT of head unremarkable  {Document critical care time when appropriate  Document review of labs and clinical decision tools ie CHADS2VASC2, etc  Document your independent review of radiology images and any outside records  Document your discussion with family members, caretakers and with consultants  Document social determinants of health affecting pt's care  Document your decision making why or why not admission, treatments were needed:32947:::1}   Final diagnoses:   Bad headache    ED Discharge Orders          Ordered    butalbital -acetaminophen -caffeine  (FIORICET ) 50-325-40 MG tablet  Every 6 hours PRN        11/24/23 2334

## 2023-11-24 NOTE — ED Triage Notes (Signed)
 Pt c/o headache x 2 weeks (2 weeks), seen here for the same on the 16th. Reports no change in her symptoms. Has not had any medication for her headache. Right knee pain since last week, she thinks its because she is straining because her left side is weak from a stroke. Taking eliquis  as prescribed.

## 2023-12-05 ENCOUNTER — Telehealth (HOSPITAL_COMMUNITY): Payer: Self-pay

## 2023-12-05 NOTE — Telephone Encounter (Signed)
 Pt's chart was accessed due to pt being on a backlogged list of patients needing assistance with PCP placement.  Per chart review, pt has appt set for 12/27/23. No further action taken at this time.

## 2023-12-23 ENCOUNTER — Telehealth: Payer: Self-pay

## 2023-12-23 ENCOUNTER — Ambulatory Visit

## 2023-12-23 NOTE — Telephone Encounter (Signed)
 Called pt to reschedule missed appt; could not reach or leave vm

## 2024-01-05 ENCOUNTER — Encounter: Payer: Self-pay | Admitting: Hematology

## 2024-01-05 ENCOUNTER — Ambulatory Visit: Payer: Self-pay

## 2024-01-05 NOTE — Telephone Encounter (Signed)
 FYI Only or Action Required?: FYI only for provider: pt not established, recommended ER.  Patient was last seen in primary care on n/a.  Called Nurse Triage reporting Leg Swelling.  Symptoms began a week ago.  Interventions attempted: Nothing.  Symptoms are: gradually worsening.  Triage Disposition: See HCP Within 4 Hours (Or PCP Triage)  Patient/caregiver understands and will follow disposition?: Yes    Copied from CRM [id number]. Topic: Clinical - Red Word Triage >> Jan 05, 2024  2:46 PM Zane F wrote: Red Word that prompted transfer to Nurse Triage:   Concern: right leg pain   Symptoms:sore and swollen  When did the symptoms start?: a week  What have you done to aid in the concern ? Have you taken anything to assist with the matter?: Yes   If so, what did you take?: Tylenol   Wanted to let you know I will be transferring you to further discuss your concern. Please be advised the nurse can assist with scheduling. Reason for Disposition  [1] Thigh or calf pain AND [2] only 1 side AND [3] present > 1 hour  Answer Assessment - Initial Assessment Questions Pt has a history of cancer, stroke, blood blots, and recently diagnosed with lupus. Given that pt has had as stroke from blood clot before, leg is swollen, painful worse when she bares weight, RN advised pt to go to the ER. RN apologized for not being able to set up NPA. Mother and pt stated understanding.   1. ONSET: When did the swelling start? (e.g., minutes, hours, days)     About a week 2. LOCATION: What part of the leg is swollen?  Are both legs swollen or just one leg?     Whole leg 3. SEVERITY: How bad is the swelling? (e.g., localized; mild, moderate, severe)     significant 4. REDNESS: Is there redness or signs of infection?     no 5. PAIN: Is the swelling painful to touch? If Yes, ask: How painful is it?   (Scale 1-10; mild, moderate or severe)     8 6. FEVER: Do you have a fever? If Yes,  ask: What is it, how was it measured, and when did it start?      no 7. CAUSE: What do you think is causing the leg swelling?     unknown 8. MEDICAL HISTORY: Do you have a history of blood clots (e.g., DVT), cancer, heart failure, kidney disease, or liver failure?     Cancer, stroke, yes 9. RECURRENT SYMPTOM: Have you had leg swelling before? If Yes, ask: When was the last time? What happened that time?     yes 10. OTHER SYMPTOMS: Do you have any other symptoms? (e.g., chest pain, difficulty breathing)       No other symptoms  Protocols used: Leg Swelling and Edema-A-AH

## 2024-01-21 ENCOUNTER — Other Ambulatory Visit: Payer: Self-pay

## 2024-01-21 DIAGNOSIS — N760 Acute vaginitis: Secondary | ICD-10-CM | POA: Insufficient documentation

## 2024-01-21 DIAGNOSIS — B9689 Other specified bacterial agents as the cause of diseases classified elsewhere: Secondary | ICD-10-CM | POA: Diagnosis not present

## 2024-01-21 DIAGNOSIS — Z91148 Patient's other noncompliance with medication regimen for other reason: Secondary | ICD-10-CM | POA: Diagnosis not present

## 2024-01-21 DIAGNOSIS — R519 Headache, unspecified: Secondary | ICD-10-CM | POA: Diagnosis present

## 2024-01-21 DIAGNOSIS — Z8673 Personal history of transient ischemic attack (TIA), and cerebral infarction without residual deficits: Secondary | ICD-10-CM | POA: Diagnosis not present

## 2024-01-21 DIAGNOSIS — F419 Anxiety disorder, unspecified: Secondary | ICD-10-CM | POA: Insufficient documentation

## 2024-01-21 LAB — WET PREP, GENITAL
Sperm: NONE SEEN
Trich, Wet Prep: NONE SEEN
WBC, Wet Prep HPF POC: >=10 — AB (ref <10)
Yeast Wet Prep HPF POC: NONE SEEN

## 2024-01-21 NOTE — ED Triage Notes (Signed)
 Pt arrives via EMS from home for c/o HA and vaginal rash; hx CVA, lupus

## 2024-01-21 NOTE — ED Triage Notes (Signed)
 Pt reports vaginal irritation and headache for the past week. Pt reports she has some dysuria also.

## 2024-01-22 ENCOUNTER — Emergency Department

## 2024-01-22 ENCOUNTER — Encounter: Payer: Self-pay | Admitting: Emergency Medicine

## 2024-01-22 ENCOUNTER — Emergency Department
Admission: EM | Admit: 2024-01-22 | Discharge: 2024-01-22 | Disposition: A | Discharge status: Home / Self Care | Attending: Emergency Medicine | Admitting: Emergency Medicine

## 2024-01-22 DIAGNOSIS — B9689 Other specified bacterial agents as the cause of diseases classified elsewhere: Secondary | ICD-10-CM

## 2024-01-22 DIAGNOSIS — R519 Headache, unspecified: Secondary | ICD-10-CM

## 2024-01-22 DIAGNOSIS — F419 Anxiety disorder, unspecified: Secondary | ICD-10-CM

## 2024-01-22 DIAGNOSIS — Z91199 Patient's noncompliance with other medical treatment and regimen due to unspecified reason: Secondary | ICD-10-CM

## 2024-01-22 HISTORY — DX: Systemic lupus erythematosus, unspecified: M32.9

## 2024-01-22 LAB — URINALYSIS, ROUTINE W REFLEX MICROSCOPIC
Bilirubin Urine: NEGATIVE
Glucose, UA: NEGATIVE mg/dL
Hgb urine dipstick: NEGATIVE
Ketones, ur: NEGATIVE mg/dL
Nitrite: POSITIVE — AB
Protein, ur: 30 mg/dL — AB
Specific Gravity, Urine: 1.023 (ref 1.005–1.030)
pH: 5 (ref 5.0–8.0)

## 2024-01-22 LAB — CHLAMYDIA/NGC RT PCR (ARMC ONLY)
Chlamydia Tr: NOT DETECTED
N gonorrhoeae: NOT DETECTED

## 2024-01-22 LAB — POC URINE PREG, ED: Preg Test, Ur: NEGATIVE

## 2024-01-22 MED ORDER — OXYCODONE HCL 5 MG PO TABS
5.0000 mg | ORAL_TABLET | Freq: Once | ORAL | Status: AC
Start: 2024-01-22 — End: 2024-01-22
  Administered 2024-01-22: 5 mg via ORAL
  Filled 2024-01-22: qty 1

## 2024-01-22 MED ORDER — BUTALBITAL-APAP-CAFFEINE 50-325-40 MG PO TABS
1.0000 | ORAL_TABLET | Freq: Once | ORAL | Status: AC
  Administered 2024-01-22: 1 via ORAL
  Filled 2024-01-22: qty 1

## 2024-01-22 MED ORDER — CLONAZEPAM 0.5 MG PO TABS: 0.5000 mg | ORAL_TABLET | Freq: Every day | ORAL | 0 refills | Status: AC

## 2024-01-22 MED ORDER — FERROUS SULFATE 325 (65 FE) MG PO TBEC: 325.0000 mg | DELAYED_RELEASE_TABLET | Freq: Every day | ORAL | 0 refills | Status: AC

## 2024-01-22 MED ORDER — METRONIDAZOLE 500 MG PO TABS: 500.0000 mg | ORAL_TABLET | Freq: Three times a day (TID) | ORAL | 0 refills | Status: AC

## 2024-01-22 MED ORDER — PROPRANOLOL HCL 20 MG PO TABS: 20.0000 mg | ORAL_TABLET | Freq: Two times a day (BID) | ORAL | 0 refills | Status: AC

## 2024-01-22 MED ORDER — ATORVASTATIN CALCIUM 20 MG PO TABS: 20.0000 mg | ORAL_TABLET | Freq: Every day | ORAL | 0 refills | Status: AC

## 2024-01-22 MED ORDER — CYANOCOBALAMIN 1000 MCG PO TABS: 1000.0000 ug | ORAL_TABLET | Freq: Every day | ORAL | 2 refills | Status: AC

## 2024-01-22 MED ORDER — BUTALBITAL-APAP-CAFFEINE 50-325-40 MG PO TABS: 1.0000 | ORAL_TABLET | Freq: Four times a day (QID) | ORAL | 0 refills | Status: AC | PRN

## 2024-01-22 MED ORDER — SUMATRIPTAN SUCCINATE 50 MG PO TABS: 50.0000 mg | ORAL_TABLET | ORAL | 0 refills | Status: AC | PRN

## 2024-01-22 MED ORDER — GABAPENTIN 400 MG PO CAPS: ORAL_CAPSULE | ORAL | 0 refills | Status: AC

## 2024-01-22 MED ORDER — APIXABAN 5 MG PO TABS: 5.0000 mg | ORAL_TABLET | Freq: Two times a day (BID) | ORAL | 0 refills | Status: AC

## 2024-01-22 NOTE — Discharge Instructions (Addendum)
 Follow-up with the recommendations made on your discharge papers also a referral was made for the hospital for a primary care provider for medication management.   prescription refills was sent to your pharmacy.  No changes were seen on your CT scan today.  Continue regular medication as prescribed by your previous doctor.

## 2024-01-22 NOTE — ED Notes (Signed)
 Returns from CT  States her pain is returning to head and vaginal area Provider aware

## 2024-01-22 NOTE — ED Provider Notes (Signed)
 [facility] Provider Note    None    (approximate)   History   Vaginal Pain   HPI  Jacqueline [name] is a 23 y.o. female   presents to the ED via EMS with complaint of vaginal irritation, vaginal discharge with dysuria and a frontal headache for the past week.  Patient is with mother who states that she is extremely concerned as patient has a history of a CVA in the past and also wants her to have her head checked out.  Patient reports that she is not sexually active at this time.  She also is requesting refills of her medication as she was living in [place] and has now moved to [place].  She states that she is out of all her medication with the exception of the Eliquis  which she has with her today and has continued to take daily.  Patient has history of CVA, lupus, hemoglobin C trait, PE, immune thrombocytopenia purpura, ileus, SIRS, vitamin B12 deficiency, normocytic anemia, migraines.      Physical Exam   Triage Vital Signs: ED Triage Vitals  Encounter Vitals Group     BP 01/21/24 2245 100/66     Girls Systolic BP Percentile --      Girls Diastolic BP Percentile --      Boys Systolic BP Percentile --      Boys Diastolic BP Percentile --      Pulse Rate 01/21/24 2245 67     Resp 01/21/24 2245 15     Temp 01/21/24 2245 98.4 F (36.9 C)     Temp Source 01/22/24 0622 Oral     SpO2 01/21/24 2205 100 %     Weight 01/21/24 2244 170 lb (77.1 kg)     Height 01/21/24 2244 5' 7 (1.702 m)     Head Circumference --      Peak Flow --      Pain Score 01/21/24 2244 10     Pain Loc --      Pain Education --      Exclude from Growth Chart --     Most recent vital signs: Vitals:   01/22/24 0821 01/22/24 1241  BP: 120/70 (!) 96/55  Pulse: 90 88  Resp: 16 17  Temp: 98 F (36.7 C) 98.2 F (36.8 C)  SpO2: 100% 100%     General: Awake, no distress.  Alert, talkative, able to answer questions, mild photosensitivity. CV:  Good peripheral  perfusion.  Heart regular rate and rhythm. Resp:  Normal effort.  Lungs clear bilaterally. Abd:  No distention.  Other:  PERRLA, EOMI's, cranial nerves II through XII grossly intact, able to answer questions appropriately with family member present.  Patient is moving upper and lower extremities at her baseline per mother.   ED Results / Procedures / Treatments   Labs (all labs ordered are listed, but only abnormal results are displayed) Labs Reviewed  WET PREP, GENITAL - Abnormal; Notable for the following components:      Result Value   Clue Cells Wet Prep HPF POC PRESENT (*)    WBC, Wet Prep HPF POC >=10 (*)    All other components within normal limits  URINALYSIS, ROUTINE W REFLEX MICROSCOPIC - Abnormal; Notable for the following components:   Color, Urine AMBER (*)    APPearance HAZY (*)    Protein, ur 30 (*)    Nitrite POSITIVE (*)    Leukocytes,Ua TRACE (*)    Bacteria, UA MANY (*)  All other components within normal limits  CHLAMYDIA/NGC RT PCR ([facility] ONLY)            POC URINE PREG, ED      RADIOLOGY  CT head without contrast per radiology is negative for acute intracranial findings.  Chronic left MCA infarct involving the left parietal lobe and left superior temporal.  Remote infarct in the anterior left frontal lobe.    PROCEDURES:  Critical Care performed:   Procedures   MEDICATIONS ORDERED IN ED: Medications  oxyCODONE  (Oxy IR/ROXICODONE ) immediate release tablet 5 mg (5 mg Oral Given 01/22/24 0731)  butalbital -acetaminophen -caffeine  (FIORICET ) 50-325-40 MG per tablet 1 tablet (1 tablet Oral Given 01/22/24 1225)     IMPRESSION / MDM / ASSESSMENT AND PLAN / ED COURSE  I reviewed the triage vital signs and the nursing notes.   Differential diagnosis includes, but is not limited to, headache, CVA, migraine, urinary tract infection, bacterial vaginosis, yeast, trichomoniasis, chlamydia, gonorrhea  23 year old female presents to the ED with complaint  of headache along with vaginal discharge and pain.  Patient currently is menstruating.  She was made aware that she does have a bacterial vaginosis and would be put on antibiotics for this.  STD screening was negative.  Urinalysis reflex some WBCs and bacteria most likely a vaginal contaminant.  Patient was given oxycodone  while in the ED for her headache and prior to discharge requested more medication for her headache.  Fioricet  which she has taken in the past was given to her in the ED after her CT scan showed no acute findings.  I spoke with mother who states that patient will be living in [place] rather than [place].  A refill of her regular medications was made until she can get established with a primary care provider.  A referral through the hospital was made for this.  Patient is also encouraged to call [facility] to see if she can be seen in their offices.  A prescription for Flagyl  was sent to the pharmacy and patient is aware that she needs to take the entire prescription for her infection.      Patient's presentation is most consistent with acute illness / injury with system symptoms.  FINAL CLINICAL IMPRESSION(S) / ED DIAGNOSES   Final diagnoses:  Generalized headache  Bacterial vaginitis  Anxiety  Medically noncompliant     Rx / DC Orders   ED Discharge Orders          Ordered    apixaban  (ELIQUIS ) 5 MG TABS tablet  2 times daily        01/22/24 1238    atorvastatin  (LIPITOR) 20 MG tablet  Daily        01/22/24 1238    butalbital -acetaminophen -caffeine  (FIORICET ) 50-325-40 MG tablet  Every 6 hours PRN        01/22/24 1238    clonazePAM  (KLONOPIN ) 0.5 MG tablet  Daily at bedtime        01/22/24 1238    cyanocobalamin  1000 MCG tablet  Daily        01/22/24 1238    ferrous sulfate  325 (65 FE) MG EC tablet  Daily with breakfast        01/22/24 1238    gabapentin  (NEURONTIN ) 400 MG capsule  Multiple Frequencies        01/22/24 1238    propranolol  (INDERAL ) 20  MG tablet  2 times daily        01/22/24 1238    SUMAtriptan  (IMITREX ) 50 MG tablet  Every 2 hours PRN        01/22/24 1238    metroNIDAZOLE  (FLAGYL ) 500 MG tablet  3 times daily        01/22/24 1237    Ambulatory Referral to Primary Care (Establish Care)       Comments: Medication management   01/22/24 1241             Note:  This document was prepared using Dragon voice recognition software and may include unintentional dictation errors.   Saunders Shona CROME, PA-C 01/22/24 1545    Claudene Rover, MD 01/23/24 [phone]

## 2024-01-22 NOTE — ED Notes (Signed)
 Pt and mother in room resting with eyes closed

## 2024-01-22 NOTE — ED Notes (Signed)
 See triage note  Presents with mother  Mom states she is having a headache  States the headache started 1 week ago  Afebrile  Alos having some pain in vaginal area  with some rash with sight discharge

## 2024-03-24 ENCOUNTER — Emergency Department (HOSPITAL_COMMUNITY)

## 2024-03-24 ENCOUNTER — Encounter: Payer: Self-pay | Admitting: Hematology

## 2024-03-24 ENCOUNTER — Emergency Department (HOSPITAL_COMMUNITY)
Admission: EM | Admit: 2024-03-24 | Discharge: 2024-03-25 | Disposition: A | Source: Home / Self Care | Attending: Emergency Medicine | Admitting: Emergency Medicine

## 2024-03-24 ENCOUNTER — Other Ambulatory Visit: Payer: Self-pay

## 2024-03-24 DIAGNOSIS — Z7901 Long term (current) use of anticoagulants: Secondary | ICD-10-CM | POA: Insufficient documentation

## 2024-03-24 DIAGNOSIS — R519 Headache, unspecified: Secondary | ICD-10-CM | POA: Diagnosis present

## 2024-03-24 DIAGNOSIS — N898 Other specified noninflammatory disorders of vagina: Secondary | ICD-10-CM | POA: Insufficient documentation

## 2024-03-24 LAB — CBC WITH DIFFERENTIAL/PLATELET
Abs Immature Granulocytes: 0.02 K/uL (ref 0.00–0.07)
Basophils Absolute: 0 K/uL (ref 0.0–0.1)
Basophils Relative: 0 %
Eosinophils Absolute: 0 K/uL (ref 0.0–0.5)
Eosinophils Relative: 0 %
HCT: 23.8 % — ABNORMAL LOW (ref 36.0–46.0)
Hemoglobin: 7.4 g/dL — ABNORMAL LOW (ref 12.0–15.0)
Immature Granulocytes: 1 %
Lymphocytes Relative: 27 %
Lymphs Abs: 0.9 K/uL (ref 0.7–4.0)
MCH: 22.6 pg — ABNORMAL LOW (ref 26.0–34.0)
MCHC: 31.1 g/dL (ref 30.0–36.0)
MCV: 72.8 fL — ABNORMAL LOW (ref 80.0–100.0)
Monocytes Absolute: 0.4 K/uL (ref 0.1–1.0)
Monocytes Relative: 10 %
Neutro Abs: 2.1 K/uL (ref 1.7–7.7)
Neutrophils Relative %: 62 %
Platelets: 55 K/uL — ABNORMAL LOW (ref 150–400)
RBC: 3.27 MIL/uL — ABNORMAL LOW (ref 3.87–5.11)
RDW: 19.3 % — ABNORMAL HIGH (ref 11.5–15.5)
Smear Review: NORMAL
WBC: 3.4 K/uL — ABNORMAL LOW (ref 4.0–10.5)
nRBC: 0 % (ref 0.0–0.2)

## 2024-03-24 LAB — COMPREHENSIVE METABOLIC PANEL WITH GFR
ALT: 30 U/L (ref 0–44)
AST: 34 U/L (ref 15–41)
Albumin: 4.1 g/dL (ref 3.5–5.0)
Alkaline Phosphatase: 103 U/L (ref 38–126)
Anion gap: 11 (ref 5–15)
BUN: 16 mg/dL (ref 6–20)
CO2: 22 mmol/L (ref 22–32)
Calcium: 8.5 mg/dL — ABNORMAL LOW (ref 8.9–10.3)
Chloride: 105 mmol/L (ref 98–111)
Creatinine, Ser: 0.92 mg/dL (ref 0.44–1.00)
GFR, Estimated: >60 mL/min
Glucose, Bld: 70 mg/dL (ref 70–99)
Potassium: 3.6 mmol/L (ref 3.5–5.1)
Sodium: 139 mmol/L (ref 135–145)
Total Bilirubin: 0.3 mg/dL (ref 0.0–1.2)
Total Protein: 7.8 g/dL (ref 6.5–8.1)

## 2024-03-24 LAB — WET PREP, GENITAL
Clue Cells Wet Prep HPF POC: NONE SEEN
Sperm: NONE SEEN
Trich, Wet Prep: NONE SEEN
WBC, Wet Prep HPF POC: <10
Yeast Wet Prep HPF POC: NONE SEEN

## 2024-03-24 LAB — HCG, SERUM, QUALITATIVE: Preg, Serum: NEGATIVE

## 2024-03-24 MED ORDER — METOCLOPRAMIDE HCL 10 MG PO TABS
10.0000 mg | ORAL_TABLET | Freq: Once | ORAL | Status: AC
  Administered 2024-03-24: 10 mg via ORAL
  Filled 2024-03-24: qty 1

## 2024-03-24 NOTE — ED Triage Notes (Addendum)
 Patient arrives via [name] from home for headache x2 weeks. Hx of stroke. Patient has right sided deficits from previous stroke. Stroke in February. Hx of lupus, non compliant with meds x3 months. Patient has been menstruating x1 month with clots. Patient endorses headache is centered, no sensitivity to light. Tylenol  taken at home prior to EMS arrival. Patient is prescribed eliquis  but endorses she has missed a couple doses.   EMS vitals BP 120/70 HR 90 99 on room air CBG 152

## 2024-03-24 NOTE — ED Provider Triage Note (Signed)
 Emergency Medicine Provider Triage Evaluation Note  Jacqueline [name] , a 24 y.o. female  was evaluated in triage.  Pt complains of headache and vaginal discharge.  Patient has been having headache for several days.  Patient had previous stroke with right-sided deficit.  Patient denies any trouble speaking or worsening weakness.  Patient supposed be on Eliquis  but has not been very compliant   Review of Systems  Positive: Headache Negative:   Physical Exam  BP 112/66   Pulse (!) 103   Temp 99 F (37.2 C) (Oral)   Resp 18   Ht 5' 7 (1.702 m)   Wt 77 kg   SpO2 100%   BMI 26.59 kg/m  Gen:   Awake, no distress   Resp:  Normal effort  MSK:   Moves extremities without difficulty  Other:  R sided weakness which is chronic   Medical Decision Making  Medically screening exam initiated at 7:56 PM.  Appropriate orders placed.  Jacqueline [name] was informed that the remainder of the evaluation will be completed by another provider, this initial triage assessment does not replace that evaluation, and the importance of remaining in the ED until their evaluation is complete.  Jacqueline [name] is a 24 y.o. female here with headache and vaginal discharge.  Patient has residual right sided weakness from previous stroke.  Will get CT head and labs.  Will have patient self swab as well.  Stable for waiting room    Patt Alm Macho, MD 03/24/24 1958

## 2024-03-25 LAB — GC/CHLAMYDIA PROBE AMP (~~LOC~~) NOT AT ARMC
Chlamydia: NEGATIVE
Comment: NEGATIVE
Comment: NORMAL
Neisseria Gonorrhea: NEGATIVE

## 2024-03-25 MED ORDER — BUTALBITAL-APAP-CAFFEINE 50-325-40 MG PO TABS
1.0000 | ORAL_TABLET | Freq: Once | ORAL | Status: AC
  Administered 2024-03-25: 1 via ORAL
  Filled 2024-03-25: qty 1

## 2024-03-25 NOTE — Discharge Instructions (Addendum)
 Labs and CT today were normal.  Vaginal swabs without signs of infection.  You will be notified if the gonorrhea/chlamydia tests are positive. Recommend close follow-up with your neurologist regarding ongoing headaches. Can speak with OB-GYN about discharge/bleeding if this persists. Return here for new concerns.

## 2024-03-25 NOTE — ED Provider Notes (Signed)
 " [place] [facility] Provider Note   CSN: [id number] Arrival date & time: 03/24/24  1846     Patient presents with: Headache   Jacqueline [name] is a 24 y.o. female.   The history is provided by the patient and medical records.  Headache  24 y.o. F with hx of prior CVA, frequent headaches and hx of migraines, lupus, presenting to the ED for headache.  States she has been having chronic headaches since her prior stroke.  Pain localized to right forehead today which is typical for her.  She denies any new numbness/weakness.  She states she used to take some medication for her headaches but does not remember what it is.  States she does not understand why headaches keep occurring.  Also reported vaginal discharge and bleeding.  She is seen frequently for this.  She denies any concern for STD.  She has had negative Gc/chl testing within the past 3 months.  No abdominal pain, genital lesions, or rashes.  Prior to Admission medications  Medication Sig Start Date End Date Taking? Authorizing Provider  apixaban  (ELIQUIS ) 5 MG TABS tablet Take 1 tablet (5 mg total) by mouth 2 (two) times daily. 01/22/24   Saunders Shona CROME, PA-C  atorvastatin  (LIPITOR) 20 MG tablet Take 1 tablet (20 mg total) by mouth daily. 01/22/24   Saunders Shona CROME, PA-C  butalbital -acetaminophen -caffeine  (FIORICET ) 50-325-40 MG tablet Take 1-2 tablets by mouth every 6 (six) hours as needed for headache. 01/22/24 01/21/25  Saunders Shona CROME, PA-C  clonazePAM  (KLONOPIN ) 0.5 MG tablet Take 1 tablet (0.5 mg total) by mouth at bedtime. 01/22/24   Saunders Shona CROME, PA-C  cyanocobalamin  1000 MCG tablet Take 1 tablet (1,000 mcg total) by mouth daily. 01/22/24   Saunders Shona CROME, PA-C  escitalopram  (LEXAPRO ) 10 MG tablet Take 0.5 tablets (5 mg total) by mouth daily for 7 days, THEN 1 tablet (10 mg total) daily. 09/16/23 11/22/23  Will Almarie MATSU, MD  ferrous sulfate  325 (65 FE) MG EC tablet  Take 1 tablet (325 mg total) by mouth daily with breakfast. 01/22/24   Saunders Shona L, PA-C  folic acid  (FOLVITE ) 1 MG tablet Take 1 tablet (1 mg total) by mouth daily. 10/27/23   Gonfa, Taye T, MD  gabapentin  (NEURONTIN ) 400 MG capsule Take 1 capsule (400 mg total) by mouth 2 (two) times daily with breakfast and lunch AND 2 capsules (800 mg total) at bedtime. 01/22/24   Saunders Shona CROME, PA-C  metroNIDAZOLE  (FLAGYL ) 500 MG tablet Take 1 tablet (500 mg total) by mouth 3 (three) times daily. 01/22/24   Saunders Shona CROME, PA-C  propranolol  (INDERAL ) 20 MG tablet Take 1 tablet (20 mg total) by mouth 2 (two) times daily. 01/22/24   Saunders Shona CROME, PA-C  senna-docusate (SENOKOT-S) 8.6-50 MG tablet Take 1-2 tablets by mouth 2 (two) times daily between meals as needed for mild constipation or moderate constipation. 10/26/23   Gonfa, Taye T, MD  SUMAtriptan  (IMITREX ) 50 MG tablet Take 1 tablet (50 mg total) by mouth every 2 (two) hours as needed for migraine. May repeat in 2 hours if headache persists or recurs. 01/22/24   Saunders Shona CROME, PA-C    Allergies: Patient has no known allergies.    Review of Systems  Genitourinary:  Positive for vaginal discharge.  Neurological:  Positive for headaches.    Updated Vital Signs BP 111/68 (BP Location: Left Arm)   Pulse 98   Temp 99 F (37.2  C) (Oral)   Resp 18   Ht 5' 7 (1.702 m)   Wt 77 kg   SpO2 100%   BMI 26.59 kg/m   Physical Exam Vitals and nursing note reviewed.  Constitutional:      General: She is not in acute distress.    Appearance: She is well-developed. She is not diaphoretic.  HENT:     Head: Normocephalic and atraumatic.     Right Ear: External ear normal.     Left Ear: External ear normal.  Eyes:     Conjunctiva/sclera: Conjunctivae normal.     Pupils: Pupils are equal, round, and reactive to light.  Neck:     Comments: No rigidity, no meningismus Cardiovascular:     Rate and Rhythm: Normal rate and regular rhythm.      Heart sounds: Normal heart sounds. No murmur heard. Pulmonary:     Effort: Pulmonary effort is normal. No respiratory distress.     Breath sounds: Normal breath sounds. No wheezing or rhonchi.  Abdominal:     General: Bowel sounds are normal.     Palpations: Abdomen is soft.     Tenderness: There is no abdominal tenderness. There is no guarding.  Genitourinary:    Comments: Self swab done in triage, pelvic deferred as she is in hallway bed Musculoskeletal:        General: Normal range of motion.     Cervical back: Full passive range of motion without pain, normal range of motion and neck supple. No rigidity.  Skin:    General: Skin is warm and dry.     Findings: No rash.  Neurological:     Mental Status: She is alert and oriented to person, place, and time.     Cranial Nerves: No cranial nerve deficit.     Sensory: No sensory deficit.     Motor: No tremor or seizure activity.     Comments: AAOx3, answering questions and following commands appropriately; equal strength UE and LE bilaterally; CN grossly intact; moves all extremities appropriately without ataxia; no focal neuro deficits or facial asymmetry appreciated  Psychiatric:        Behavior: Behavior normal.        Thought Content: Thought content normal.     (all labs ordered are listed, but only abnormal results are displayed) Labs Reviewed  CBC WITH DIFFERENTIAL/PLATELET - Abnormal; Notable for the following components:      Result Value   WBC 3.4 (*)    RBC 3.27 (*)    Hemoglobin 7.4 (*)    HCT 23.8 (*)    MCV 72.8 (*)    MCH 22.6 (*)    RDW 19.3 (*)    Platelets 55 (*)    All other components within normal limits  COMPREHENSIVE METABOLIC PANEL WITH GFR - Abnormal; Notable for the following components:   Calcium  8.5 (*)    All other components within normal limits  WET PREP, GENITAL  HCG, SERUM, QUALITATIVE  GC/CHLAMYDIA PROBE AMP ([place]) NOT AT [facility]    EKG: None  Radiology: CT HEAD WO CONTRAST  ( ) Result Date: 03/24/2024 EXAM: CT HEAD WITHOUT CONTRAST 03/24/2024 09:15:30 PM TECHNIQUE: CT of the head was performed without the administration of intravenous contrast. Automated exposure control, iterative reconstruction, and/or weight based adjustment of the mA/kV was utilized to reduce the radiation dose to as low as reasonably achievable. COMPARISON: 01/22/2024 CLINICAL HISTORY: Headache, increasing frequency or severity. FINDINGS: BRAIN AND VENTRICLES: Encephalomalacic changes related to old left  MCA distribution infarct. No acute hemorrhage. No evidence of acute infarct. No hydrocephalus. No extra-axial collection. No mass effect or midline shift. ORBITS: No acute abnormality. SINUSES: No acute abnormality. SOFT TISSUES AND SKULL: No acute soft tissue abnormality. No skull fracture. IMPRESSION: 1. No acute intracranial abnormality. 2. 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 Electronically signed by: Pinkie Pebbles MD 03/24/2024 09:21 PM EST RP Workstation: [id number]     Procedures   Medications Ordered in the ED  metoCLOPramide  (REGLAN ) tablet 10 mg (10 mg Oral Given 03/24/24 1952)  butalbital -acetaminophen -caffeine  (FIORICET ) 50-325-40 MG per tablet 1 tablet (1 tablet Oral Given 03/25/24 0240)                                    Medical Decision Making Amount and/or Complexity of Data Reviewed Radiology: ordered and independent interpretation performed. ECG/medicine tests: ordered and independent interpretation performed.  Risk Prescription drug management.   24 year old female here with multiple complaints, notably headache and vaginal discharge/bleeding.  Does appear she is seen frequently for same.  She is afebrile and nontoxic in appearance here.  Has had prior stroke but does not have any new focal deficits on exam.  Labs are reassuring without leukocytosis or electrolyte derangement.  Hemoglobin 7.4, does have chronic anemia.  She is not endorsing any generalized weakness or  feelings of syncope.  Vaginal swabs were performed in triage, wet prep is negative.  Gc/chl pending but has had negative testing within the past 3 months.  CT head also negative.    Per chart review, looks like she used to take Fioricet  for migraines and usually has good relief with this.  Was given a dose here.  She is upset that I am not able to tell her why she continues having headaches.  I have explained that this could be sequela of her prior strokes as it does seem like it has increased in frequency since then.  I recommended that she follow-up closely with her neurologist, Dr. Margaret.  Also given information for local GYN if continued vaginal bleeding.  Can return here for new concerns.  Final diagnoses:  Bad headache  Vaginal discharge    ED Discharge Orders     None          Jarold Olam CHRISTELLA DEVONNA 03/25/24 [id number]    Palumbo, April, MD 03/25/24 [phone]  "

## 2024-03-30 ENCOUNTER — Encounter: Payer: Self-pay | Admitting: Family Medicine

## 2024-03-30 ENCOUNTER — Ambulatory Visit: Admitting: Family Medicine

## 2024-03-30 NOTE — Progress Notes (Unsigned)
 "   New patient visit   Patient: Jacqueline [name]   DOB: 31-Oct-2000   23 y.o. Female  MRN: [id number] Visit Date: 03/30/2024  Today's healthcare provider: LAURAINE LOISE BUOY, DO   No chief complaint on file.  Subjective    Jacqueline [name] is a 24 y.o. female who presents today as a new patient to establish care.  HPI    *** 24 year old F with PMH of CVA with RUE paresis and aphasia, hx PE and lupus anticoagulant on Eliquis , migraine headache, abnormal uterine bleeding (menorrhagia), THC abuse, thrombocytopenia and vitamin B12 deficiency.     Past Medical History:  Diagnosis Date   Bacterial vaginitis 07/10/2016   Candida vaginitis 07/24/2016   Headache in pregnancy, antepartum, third trimester 07/24/2016   Kell isoimmunization during pregnancy    Lupus (systemic lupus erythematosus) (HCC)    Stroke [facility])    Past Surgical History:  Procedure Laterality Date   CESAREAN SECTION N/A 09/04/2016   Procedure: CESAREAN SECTION;  Surgeon: [name], DO;  Location: [facility];  Service: Obstetrics;  Laterality: N/A;   CESAREAN SECTION Bilateral    IR CT HEAD LTD  02/21/2023   IR GASTROSTOMY TUBE MOD SED  03/21/2023   IR GASTROSTOMY TUBE REMOVAL  05/14/2023   IR PERCUTANEOUS ART THROMBECTOMY/INFUSION INTRACRANIAL INC DIAG ANGIO  02/21/2023   IR REPLC GASTRO/COLONIC TUBE PERCUT W/FLUORO  03/27/2023   RADIOLOGY WITH ANESTHESIA N/A 02/21/2023   Procedure: IR WITH ANESTHESIA;  Surgeon: Dolphus Carrion, MD;  Location: [facility] OR;  Service: Radiology;  Laterality: N/A;   Family Status  Relation Name Status   Mother  Alive   Father  Alive   MGM  (Not Specified)   Neg Hx  (Not Specified)  No partnership data on file   Family History  Problem Relation Age of Onset   Hypertension Maternal Grandmother    Diabetes Maternal Grandmother    Cancer Neg Hx    Social History   Socioeconomic History   Marital status: Single    Spouse name: Not on file   Number of children: Not on  file   Years of education: Not on file   Highest education level: Not on file  Occupational History   Not on file  Tobacco Use   Smoking status: Former    Types: Cigars   Smokeless tobacco: Never   Tobacco comments:    1 B&M per day  Vaping Use   Vaping status: Never Used  Substance and Sexual Activity   Alcohol use: Not Currently   Drug use: No   Sexual activity: Yes    Partners: Male    Birth control/protection: None  Other Topics Concern   Not on file  Social History Narrative   [phone]- patient's confidential cell   Social Drivers of Health   Tobacco Use: Medium Risk (01/21/2024)   Patient History    Smoking Tobacco Use: Former    Smokeless Tobacco Use: Never    Passive Exposure: Not on Actuary Strain: Not on File (06/21/2021)   Received from [employer]    Financial Resource Strain: 0  Food Insecurity: No Food Insecurity (10/19/2023)   Epic    Worried About Programme Researcher, Broadcasting/film/video in the Last Year: Never true    Ran Out of Food in the Last Year: Never true  Transportation Needs: Unmet Transportation Needs (10/19/2023)   Epic    Lack of Transportation (Medical): Yes  Lack of Transportation (Non-Medical): Yes  Physical Activity: Not on File (06/21/2021)   Received from [facility]   Physical Activity    Physical Activity: 0  Stress: Not on File (06/21/2021)   Received from [facility]   Stress    Stress: 0  Social Connections: Not on File (11/16/2022)   Received from [facility]   Social Connections    Connectedness: 0  Depression (PHQ2-9): Not on file  Alcohol Screen: Not on file  Housing: Low Risk (10/19/2023)   Epic    Unable to Pay for Housing in the Last Year: No    Number of Times Moved in the Last Year: 0    Homeless in the Last Year: No  Utilities: Not At Risk (10/19/2023)   Epic    Threatened with loss of utilities: No  Health Literacy: Not on file   Show/hide medication list[1] Allergies[2]  Immunization History   Administered Date(s) Administered   Tdap 09/06/2016    Health Maintenance  Topic Date Due   COVID-19 Vaccine (1) Never done   HPV VACCINES (1 - 3-dose series) Never done   Pneumococcal Vaccine (1 of 2 - PCV) 08/21/2017   Hepatitis B Vaccines 19-59 Average Risk (1 of 3 - 19+ 3-dose series) Never done   Cervical Cancer Screening (Pap smear)  Never done   Influenza Vaccine  Never done   DTaP/Tdap/Td (2 - Td or Tdap) 09/07/2026   Hepatitis C Screening  Completed   HIV Screening  Completed   Meningococcal B Vaccine  Aged Out    Patient Care Team: Pcp, No as PCP - General  Review of Systems  {Insert previous labs (optional):23779} {See past labs  Heme  Chem  Endocrine  Serology  Results Review (optional):1}   Objective    There were no vitals taken for this visit. {Insert last BP/Wt (optional):23777}{See vitals history (optional):1}   Physical Exam   Depression Screen    05/22/2023    8:41 AM  PHQ 2/9 Scores  Exception Documentation Medical reason   No results found for any visits on 03/30/24.  Assessment & Plan     There are no diagnoses linked to this encounter.   ***  No follow-ups on file.     I discussed the assessment and treatment plan with the patient  The patient was provided an opportunity to ask questions and all were answered. The patient agreed with the plan and demonstrated an understanding of the instructions.   The patient was advised to call back or seek an in-person evaluation if the symptoms worsen or if the condition fails to improve as anticipated.    LAURAINE LOISE BUOY, DO  [facility] [phone] (phone) [phone] (fax)  [facility]    [1]  Outpatient Medications Prior to Visit  Medication Sig   apixaban  (ELIQUIS ) 5 MG TABS tablet Take 1 tablet (5 mg total) by mouth 2 (two) times daily.   atorvastatin  (LIPITOR) 20 MG tablet Take 1 tablet (20 mg total) by mouth daily.    butalbital -acetaminophen -caffeine  (FIORICET ) 50-325-40 MG tablet Take 1-2 tablets by mouth every 6 (six) hours as needed for headache.   clonazePAM  (KLONOPIN ) 0.5 MG tablet Take 1 tablet (0.5 mg total) by mouth at bedtime.   cyanocobalamin  1000 MCG tablet Take 1 tablet (1,000 mcg total) by mouth daily.   escitalopram  (LEXAPRO ) 10 MG tablet Take 0.5 tablets (5 mg total) by mouth daily for 7 days, THEN 1 tablet (10 mg total) daily.  ferrous sulfate  325 (65 FE) MG EC tablet Take 1 tablet (325 mg total) by mouth daily with breakfast.   folic acid  (FOLVITE ) 1 MG tablet Take 1 tablet (1 mg total) by mouth daily.   gabapentin  (NEURONTIN ) 400 MG capsule Take 1 capsule (400 mg total) by mouth 2 (two) times daily with breakfast and lunch AND 2 capsules (800 mg total) at bedtime.   metroNIDAZOLE  (FLAGYL ) 500 MG tablet Take 1 tablet (500 mg total) by mouth 3 (three) times daily.   propranolol  (INDERAL ) 20 MG tablet Take 1 tablet (20 mg total) by mouth 2 (two) times daily.   senna-docusate (SENOKOT-S) 8.6-50 MG tablet Take 1-2 tablets by mouth 2 (two) times daily between meals as needed for mild constipation or moderate constipation.   SUMAtriptan  (IMITREX ) 50 MG tablet Take 1 tablet (50 mg total) by mouth every 2 (two) hours as needed for migraine. May repeat in 2 hours if headache persists or recurs.   No facility-administered medications prior to visit.  [2] No Known Allergies  "

## 2024-05-06 ENCOUNTER — Encounter: Payer: Self-pay | Admitting: Obstetrics & Gynecology

## 2024-05-19 ENCOUNTER — Ambulatory Visit: Admitting: Diagnostic Neuroimaging
